# Patient Record
Sex: Female | Born: 1950 | Race: White | Hispanic: No | Marital: Married | Smoking: Never smoker
Health system: Southern US, Community
[De-identification: ages and names within clinical notes are randomized; demographics above are authoritative.]

## PROBLEM LIST (undated history)

## (undated) DIAGNOSIS — J189 Pneumonia, unspecified organism: Secondary | ICD-10-CM

## (undated) DIAGNOSIS — M8580 Other specified disorders of bone density and structure, unspecified site: Secondary | ICD-10-CM

## (undated) DIAGNOSIS — Z8719 Personal history of other diseases of the digestive system: Secondary | ICD-10-CM

## (undated) DIAGNOSIS — R112 Nausea with vomiting, unspecified: Secondary | ICD-10-CM

## (undated) DIAGNOSIS — K5790 Diverticulosis of intestine, part unspecified, without perforation or abscess without bleeding: Secondary | ICD-10-CM

## (undated) DIAGNOSIS — K2210 Ulcer of esophagus without bleeding: Secondary | ICD-10-CM

## (undated) DIAGNOSIS — E039 Hypothyroidism, unspecified: Secondary | ICD-10-CM

## (undated) DIAGNOSIS — T8859XA Other complications of anesthesia, initial encounter: Secondary | ICD-10-CM

## (undated) DIAGNOSIS — K5900 Constipation, unspecified: Secondary | ICD-10-CM

## (undated) DIAGNOSIS — F0781 Postconcussional syndrome: Secondary | ICD-10-CM

## (undated) DIAGNOSIS — K219 Gastro-esophageal reflux disease without esophagitis: Secondary | ICD-10-CM

## (undated) DIAGNOSIS — G4733 Obstructive sleep apnea (adult) (pediatric): Secondary | ICD-10-CM

## (undated) DIAGNOSIS — Z9889 Other specified postprocedural states: Secondary | ICD-10-CM

## (undated) DIAGNOSIS — R269 Unspecified abnormalities of gait and mobility: Secondary | ICD-10-CM

## (undated) DIAGNOSIS — I10 Essential (primary) hypertension: Secondary | ICD-10-CM

## (undated) DIAGNOSIS — K5792 Diverticulitis of intestine, part unspecified, without perforation or abscess without bleeding: Secondary | ICD-10-CM

## (undated) DIAGNOSIS — I4891 Unspecified atrial fibrillation: Secondary | ICD-10-CM

## (undated) DIAGNOSIS — I499 Cardiac arrhythmia, unspecified: Secondary | ICD-10-CM

## (undated) DIAGNOSIS — R7303 Prediabetes: Secondary | ICD-10-CM

## (undated) DIAGNOSIS — E785 Hyperlipidemia, unspecified: Secondary | ICD-10-CM

## (undated) DIAGNOSIS — M549 Dorsalgia, unspecified: Secondary | ICD-10-CM

## (undated) DIAGNOSIS — F419 Anxiety disorder, unspecified: Secondary | ICD-10-CM

## (undated) DIAGNOSIS — M1990 Unspecified osteoarthritis, unspecified site: Secondary | ICD-10-CM

## (undated) DIAGNOSIS — K760 Fatty (change of) liver, not elsewhere classified: Secondary | ICD-10-CM

## (undated) DIAGNOSIS — D869 Sarcoidosis, unspecified: Secondary | ICD-10-CM

## (undated) DIAGNOSIS — F32A Depression, unspecified: Secondary | ICD-10-CM

## (undated) DIAGNOSIS — F329 Major depressive disorder, single episode, unspecified: Secondary | ICD-10-CM

## (undated) DIAGNOSIS — F0390 Unspecified dementia without behavioral disturbance: Secondary | ICD-10-CM

## (undated) HISTORY — DX: Hypothyroidism, unspecified: E03.9

## (undated) HISTORY — PX: HAND SURGERY: SHX662

## (undated) HISTORY — PX: TOTAL ABDOMINAL HYSTERECTOMY: SHX209

## (undated) HISTORY — PX: CATARACT EXTRACTION: SUR2

## (undated) HISTORY — DX: Diverticulitis of intestine, part unspecified, without perforation or abscess without bleeding: K57.92

## (undated) HISTORY — DX: Gastro-esophageal reflux disease without esophagitis: K21.9

## (undated) HISTORY — DX: Fatty (change of) liver, not elsewhere classified: K76.0

## (undated) HISTORY — DX: Depression, unspecified: F32.A

## (undated) HISTORY — PX: CATARACT EXTRACTION W/ INTRAOCULAR LENS IMPLANT: SHX1309

## (undated) HISTORY — DX: Unspecified atrial fibrillation: I48.91

## (undated) HISTORY — DX: Postconcussional syndrome: F07.81

## (undated) HISTORY — PX: ROTATOR CUFF REPAIR: SHX139

## (undated) HISTORY — PX: LASIK: SHX215

## (undated) HISTORY — DX: Diverticulosis of intestine, part unspecified, without perforation or abscess without bleeding: K57.90

## (undated) HISTORY — DX: Obstructive sleep apnea (adult) (pediatric): G47.33

## (undated) HISTORY — PX: KNEE SURGERY: SHX244

## (undated) HISTORY — DX: Ulcer of esophagus without bleeding: K22.10

## (undated) HISTORY — DX: Other specified disorders of bone density and structure, unspecified site: M85.80

## (undated) HISTORY — PX: COLONOSCOPY: SHX174

## (undated) HISTORY — DX: Essential (primary) hypertension: I10

## (undated) HISTORY — DX: Hyperlipidemia, unspecified: E78.5

## (undated) HISTORY — DX: Constipation, unspecified: K59.00

## (undated) HISTORY — PX: DILATION AND CURETTAGE OF UTERUS: SHX78

## (undated) HISTORY — DX: Dorsalgia, unspecified: M54.9

## (undated) HISTORY — DX: Prediabetes: R73.03

## (undated) HISTORY — DX: Major depressive disorder, single episode, unspecified: F32.9

## (undated) HISTORY — DX: Unspecified abnormalities of gait and mobility: R26.9

## (undated) HISTORY — PX: BREAST BIOPSY: SHX20

## (undated) HISTORY — PX: EYE SURGERY: SHX253

---

## 2006-10-15 ENCOUNTER — Encounter: Admission: RE | Admit: 2006-10-15 | Discharge: 2006-10-15 | Payer: Self-pay | Admitting: *Deleted

## 2006-10-22 ENCOUNTER — Encounter: Admission: RE | Admit: 2006-10-22 | Discharge: 2006-10-22 | Payer: Self-pay | Admitting: *Deleted

## 2009-01-22 ENCOUNTER — Encounter: Admission: RE | Admit: 2009-01-22 | Discharge: 2009-01-22 | Payer: Self-pay | Admitting: Family Medicine

## 2010-08-08 ENCOUNTER — Emergency Department (HOSPITAL_BASED_OUTPATIENT_CLINIC_OR_DEPARTMENT_OTHER): Admission: EM | Admit: 2010-08-08 | Discharge: 2010-08-08 | Payer: Self-pay | Admitting: Emergency Medicine

## 2010-08-08 ENCOUNTER — Ambulatory Visit: Payer: Self-pay | Admitting: Diagnostic Radiology

## 2010-10-12 ENCOUNTER — Encounter: Payer: Self-pay | Admitting: Family Medicine

## 2011-02-14 ENCOUNTER — Ambulatory Visit (HOSPITAL_COMMUNITY)
Admission: RE | Admit: 2011-02-14 | Discharge: 2011-02-14 | Disposition: A | Discharge status: Home / Self Care | Payer: BC Managed Care – PPO | Source: Ambulatory Visit | Attending: Orthopedic Surgery | Admitting: Orthopedic Surgery

## 2011-02-14 ENCOUNTER — Encounter (HOSPITAL_COMMUNITY)
Admission: RE | Admit: 2011-02-14 | Discharge: 2011-02-14 | Disposition: A | Discharge status: Home / Self Care | Payer: BC Managed Care – PPO | Source: Ambulatory Visit | Attending: Orthopedic Surgery | Admitting: Orthopedic Surgery

## 2011-02-14 ENCOUNTER — Other Ambulatory Visit (HOSPITAL_COMMUNITY): Payer: Self-pay | Admitting: Orthopedic Surgery

## 2011-02-14 DIAGNOSIS — M75101 Unspecified rotator cuff tear or rupture of right shoulder, not specified as traumatic: Secondary | ICD-10-CM

## 2011-02-14 DIAGNOSIS — Z01812 Encounter for preprocedural laboratory examination: Secondary | ICD-10-CM | POA: Insufficient documentation

## 2011-02-14 DIAGNOSIS — G4730 Sleep apnea, unspecified: Secondary | ICD-10-CM | POA: Insufficient documentation

## 2011-02-14 DIAGNOSIS — Z01818 Encounter for other preprocedural examination: Secondary | ICD-10-CM | POA: Insufficient documentation

## 2011-02-14 DIAGNOSIS — I10 Essential (primary) hypertension: Secondary | ICD-10-CM | POA: Insufficient documentation

## 2011-02-14 DIAGNOSIS — Z01810 Encounter for preprocedural cardiovascular examination: Secondary | ICD-10-CM | POA: Insufficient documentation

## 2011-02-14 LAB — URINALYSIS, ROUTINE W REFLEX MICROSCOPIC
Bilirubin Urine: NEGATIVE
Glucose, UA: NEGATIVE mg/dL
Hgb urine dipstick: NEGATIVE
Ketones, ur: NEGATIVE mg/dL
Nitrite: NEGATIVE
Protein, ur: NEGATIVE mg/dL
Specific Gravity, Urine: 1.021 (ref 1.005–1.030)
Urobilinogen, UA: 0.2 mg/dL (ref 0.0–1.0)
pH: 6 (ref 5.0–8.0)

## 2011-02-14 LAB — DIFFERENTIAL
Basophils Absolute: 0.1 10*3/uL (ref 0.0–0.1)
Basophils Relative: 2 % — ABNORMAL HIGH (ref 0–1)
Eosinophils Absolute: 0.3 10*3/uL (ref 0.0–0.7)
Eosinophils Relative: 4 % (ref 0–5)
Lymphocytes Relative: 32 % (ref 12–46)
Lymphs Abs: 2.3 10*3/uL (ref 0.7–4.0)
Monocytes Absolute: 0.5 10*3/uL (ref 0.1–1.0)
Monocytes Relative: 6 % (ref 3–12)
Neutro Abs: 4.2 10*3/uL (ref 1.7–7.7)
Neutrophils Relative %: 57 % (ref 43–77)

## 2011-02-14 LAB — CBC
HCT: 41.1 % (ref 36.0–46.0)
Hemoglobin: 13.9 g/dL (ref 12.0–15.0)
MCH: 28.8 pg (ref 26.0–34.0)
MCHC: 33.8 g/dL (ref 30.0–36.0)
MCV: 85.3 fL (ref 78.0–100.0)
Platelets: 246 10*3/uL (ref 150–400)
RBC: 4.82 MIL/uL (ref 3.87–5.11)
RDW: 13.2 % (ref 11.5–15.5)
WBC: 7.3 10*3/uL (ref 4.0–10.5)

## 2011-02-14 LAB — BASIC METABOLIC PANEL
BUN: 14 mg/dL (ref 6–23)
CO2: 27 mEq/L (ref 19–32)
Calcium: 9.2 mg/dL (ref 8.4–10.5)
Chloride: 104 mEq/L (ref 96–112)
Creatinine, Ser: 0.61 mg/dL (ref 0.4–1.2)
GFR calc Af Amer: >60 mL/min (ref >60)
GFR calc non Af Amer: >60 mL/min (ref >60)
Glucose, Bld: 91 mg/dL (ref 70–99)
Potassium: 3.6 mEq/L (ref 3.5–5.1)
Sodium: 141 mEq/L (ref 135–145)

## 2011-02-14 LAB — URINE MICROSCOPIC-ADD ON

## 2011-02-14 LAB — APTT: aPTT: 27 seconds (ref 24–37)

## 2011-02-14 LAB — SURGICAL PCR SCREEN
MRSA, PCR: NEGATIVE
Staphylococcus aureus: NEGATIVE

## 2011-02-14 LAB — PROTIME-INR
INR: 0.93 (ref 0.00–1.49)
Prothrombin Time: 12.7 seconds (ref 11.6–15.2)

## 2011-02-17 ENCOUNTER — Ambulatory Visit (HOSPITAL_COMMUNITY)
Admission: RE | Admit: 2011-02-17 | Discharge: 2011-02-18 | Disposition: A | Discharge status: Home / Self Care | Payer: BC Managed Care – PPO | Source: Ambulatory Visit | Attending: Orthopedic Surgery | Admitting: Orthopedic Surgery

## 2011-02-17 DIAGNOSIS — M67919 Unspecified disorder of synovium and tendon, unspecified shoulder: Secondary | ICD-10-CM | POA: Insufficient documentation

## 2011-02-17 DIAGNOSIS — E669 Obesity, unspecified: Secondary | ICD-10-CM | POA: Insufficient documentation

## 2011-02-17 DIAGNOSIS — Z5333 Arthroscopic surgical procedure converted to open procedure: Secondary | ICD-10-CM | POA: Insufficient documentation

## 2011-02-17 DIAGNOSIS — M7530 Calcific tendinitis of unspecified shoulder: Secondary | ICD-10-CM | POA: Insufficient documentation

## 2011-02-17 DIAGNOSIS — M19019 Primary osteoarthritis, unspecified shoulder: Secondary | ICD-10-CM | POA: Insufficient documentation

## 2011-02-17 DIAGNOSIS — G4730 Sleep apnea, unspecified: Secondary | ICD-10-CM | POA: Insufficient documentation

## 2011-02-17 DIAGNOSIS — M719 Bursopathy, unspecified: Secondary | ICD-10-CM | POA: Insufficient documentation

## 2011-02-17 DIAGNOSIS — I10 Essential (primary) hypertension: Secondary | ICD-10-CM | POA: Insufficient documentation

## 2011-02-18 LAB — BASIC METABOLIC PANEL
BUN: 13 mg/dL (ref 6–23)
CO2: 28 mEq/L (ref 19–32)
Calcium: 8.3 mg/dL — ABNORMAL LOW (ref 8.4–10.5)
Chloride: 105 mEq/L (ref 96–112)
Creatinine, Ser: 0.57 mg/dL (ref 0.4–1.2)
GFR calc Af Amer: >60 mL/min (ref >60)
GFR calc non Af Amer: >60 mL/min (ref >60)
Glucose, Bld: 146 mg/dL — ABNORMAL HIGH (ref 70–99)
Potassium: 3.6 mEq/L (ref 3.5–5.1)
Sodium: 141 mEq/L (ref 135–145)

## 2011-02-18 LAB — CBC
HCT: 36.3 % (ref 36.0–46.0)
Hemoglobin: 12.2 g/dL (ref 12.0–15.0)
MCH: 29 pg (ref 26.0–34.0)
MCHC: 33.6 g/dL (ref 30.0–36.0)
MCV: 86.2 fL (ref 78.0–100.0)
Platelets: 210 10*3/uL (ref 150–400)
RBC: 4.21 MIL/uL (ref 3.87–5.11)
RDW: 13.1 % (ref 11.5–15.5)
WBC: 12.8 10*3/uL — ABNORMAL HIGH (ref 4.0–10.5)

## 2011-03-31 NOTE — Op Note (Signed)
Lisa Acosta, Lisa NO.:  [id number]  MEDICAL RECORD NO.:  [id number]  LOCATION:  5015                         FACILITY:  [facility]  PHYSICIAN:  Almedia Balls. Ranell Patrick, M.D. DATE OF BIRTH:  1951/01/13  DATE OF PROCEDURE:  02/17/2011 DATE OF DISCHARGE:                              OPERATIVE REPORT   PREOPERATIVE DIAGNOSES:  Right shoulder pain secondary to rotator cuff tear and also acromioclavicular joint arthritis.  POSTOPERATIVE DIAGNOSES:  Right shoulder pain secondary to rotator cuff tear and also acromioclavicular joint arthritis as well as calcific tendonitis.  PROCEDURE PERFORMED:  Right shoulder fluoroscopy with arthroscopic subacromial decompression followed by mini open rotator cuff repair as well as decompression of calcific deposits and open distal clavicle resection.  ATTENDING SURGEON:  Almedia Balls. Ranell Patrick, MD  ASSISTANT:  Jaquelyn Bitter. Chabon, PA  General anesthesia was used plus interscalene block.  ESTIMATED BLOOD LOSS:  Minimal.  FLUID REPLACEMENT:  Crystalloid 1200 mL.  INSTRUMENT COUNTS:  Correct.  There were no complications.  Perioperative antibiotics were given.  INDICATIONS:  The patient is a 60 year old female with a history of worsening right shoulder pain secondary to rotator cuff tear as well as impingement syndrome and AC joint arthritis.  The patient presents now having failed conservative management, desired operative treatment to restore function and eliminate pain to her shoulder.  Informed consent obtained.  DESCRIPTION OF PROCEDURE:  After an adequate level of anesthesia was achieved, the patient positioned in modified beach-chair position. Right shoulder examined under anesthesia.  Full passive range of motion of the shoulder noted with no undue stiffness, no instability.  After sterile prep and drape of the shoulder, we entered the shoulder using standard portals including anterior, posterior, and lateral portals.   We identified significant synovitis within the shoulder joint but no obvious superior labral tear.  The biceps tendon was retracted and the joint was felt to be normal.  The subscapularis was normal.  Rotator cuff was visualized from the joint side and was normal.  Articular cartilage was visualized from both the posterior aspect of the shoulder and the anterior aspect of the shoulder and was noted to be normal.  No significant cartilage defects noted.  Posterior labrum was intact. Posterior-inferior and anterior-inferior labrum was normal.  At this point, the scope was placed in the subacromial space.  Thorough bursectomy was performed followed by an acromioplasty creating a type 1 acromial shape with a butcher block technique and a high-speed bur.  We decompressed all the way over to the [facility] joint and out laterally to the anterolateral shoulder careful to remove the lateral overhang.  At this point, we inspected the rotator cuff.  It felt like there was a defect present right about the middle of the supraspinatus tendon, and also just posterior to that a fairly large lump of what appeared to be calcium.  It was not expressible after needling it with an 18-gauge needle.  At this point, we concluded the arthroscopic portion of the procedure.  We then made a saber incision overlying the AC joint. Dissection down through subcutaneous tissues and this was in [employer].  Subcu dissection with needle-tipped Bovie.  We identified the deltotrapezial fascia,  divided that in line with distal clavicle followed by subperiosteal dissection of distal clavicle.  We then performed a distal clavicle resection using an oscillating saw.  A 2 mm to 3 mm of distal clavicle was removed.  We then applied bone wax to cut into the clavicle thoroughly irrigating the AC interval and then removing some soft tissue from the joint there basically to enter the disk in the [facility] joint and little bit dorsal capsule  that appeared to be pathologic.  At this point, thoroughly irrigated and closed the deltotrapezial fascia with 0 Vicryl interrupted figure-of-eight suture followed by 2-0 Vicryl subcutaneous closure.  We then addressed the rotator cuff tear through the mini open incision, starting at the anterolateral border of the acromion, extending distally about 4 cm. Dissection down through subcutaneous tissues using Bovie.  We identified the raphe between the anterolateral head of the deltoid and divided that using the needle-tipped Bovie, developed that interval, placed our Arthrex retractor.  Next, we went ahead and was able to easily palpate a large calcific mass present just posterior to an area of softness consistent with the rotator cuff tear.  We went ahead and incised the tendon in between that area in line with its fibers immediately encountering some damaged degenerative tendon and also a large calcific mass which was pretty firm.  We removed that using a Therapist, nutritional and then used a curette and a rongeur to prepare the rotator cuff footprint. We then performed a side-to-side repair with #2 FiberWire suture x3 and some 0 Vicryl reinforcing as well.  We had a nice repair not under tension.  The rotator cuff fully decompressed and repaired.  At this point, we took the shoulder through a full range of motion.  No impingement was noted, thoroughly irrigated the subdeltoid interval, repaired the deltoid to itself with 0 Vicryl suture followed by 2-0 Vicryl subcutaneous closure and 4-0 Monocryl for skin.  Steri-Strips were applied followed by sterile dressing.  The patient tolerated the surgery well.     Almedia Balls. Ranell Patrick, M.D.     SRN/MEDQ  D:  02/17/2011  T:  02/18/2011  Job:  [id number]  Electronically Signed by Malon Kindle  on 03/31/2011 12:04:55 AM

## 2013-11-25 DIAGNOSIS — F411 Generalized anxiety disorder: Secondary | ICD-10-CM | POA: Insufficient documentation

## 2014-03-18 DIAGNOSIS — K2210 Ulcer of esophagus without bleeding: Secondary | ICD-10-CM | POA: Insufficient documentation

## 2014-03-18 DIAGNOSIS — K219 Gastro-esophageal reflux disease without esophagitis: Secondary | ICD-10-CM | POA: Insufficient documentation

## 2014-06-05 DIAGNOSIS — G4700 Insomnia, unspecified: Secondary | ICD-10-CM | POA: Insufficient documentation

## 2014-06-05 DIAGNOSIS — J309 Allergic rhinitis, unspecified: Secondary | ICD-10-CM | POA: Insufficient documentation

## 2014-06-05 DIAGNOSIS — M25559 Pain in unspecified hip: Secondary | ICD-10-CM | POA: Insufficient documentation

## 2014-06-05 DIAGNOSIS — K5730 Diverticulosis of large intestine without perforation or abscess without bleeding: Secondary | ICD-10-CM | POA: Insufficient documentation

## 2014-06-05 DIAGNOSIS — R739 Hyperglycemia, unspecified: Secondary | ICD-10-CM | POA: Insufficient documentation

## 2014-06-05 DIAGNOSIS — M1990 Unspecified osteoarthritis, unspecified site: Secondary | ICD-10-CM | POA: Insufficient documentation

## 2014-06-05 DIAGNOSIS — R7301 Impaired fasting glucose: Secondary | ICD-10-CM | POA: Insufficient documentation

## 2014-06-05 DIAGNOSIS — K449 Diaphragmatic hernia without obstruction or gangrene: Secondary | ICD-10-CM | POA: Insufficient documentation

## 2014-06-05 DIAGNOSIS — M19019 Primary osteoarthritis, unspecified shoulder: Secondary | ICD-10-CM | POA: Insufficient documentation

## 2014-06-05 DIAGNOSIS — K2900 Acute gastritis without bleeding: Secondary | ICD-10-CM | POA: Insufficient documentation

## 2014-06-05 DIAGNOSIS — M545 Low back pain, unspecified: Secondary | ICD-10-CM | POA: Insufficient documentation

## 2014-06-05 DIAGNOSIS — M79644 Pain in right finger(s): Secondary | ICD-10-CM | POA: Insufficient documentation

## 2014-06-05 DIAGNOSIS — R1310 Dysphagia, unspecified: Secondary | ICD-10-CM | POA: Insufficient documentation

## 2014-07-25 ENCOUNTER — Encounter: Payer: Self-pay | Admitting: *Deleted

## 2015-06-04 DIAGNOSIS — H34831 Tributary (branch) retinal vein occlusion, right eye: Secondary | ICD-10-CM | POA: Diagnosis not present

## 2015-06-04 DIAGNOSIS — H35033 Hypertensive retinopathy, bilateral: Secondary | ICD-10-CM | POA: Diagnosis not present

## 2015-06-04 DIAGNOSIS — H3581 Retinal edema: Secondary | ICD-10-CM | POA: Diagnosis not present

## 2015-06-15 DIAGNOSIS — Z23 Encounter for immunization: Secondary | ICD-10-CM | POA: Diagnosis not present

## 2015-06-15 DIAGNOSIS — F339 Major depressive disorder, recurrent, unspecified: Secondary | ICD-10-CM | POA: Diagnosis not present

## 2015-06-15 DIAGNOSIS — R5383 Other fatigue: Secondary | ICD-10-CM | POA: Diagnosis not present

## 2015-06-15 DIAGNOSIS — E669 Obesity, unspecified: Secondary | ICD-10-CM | POA: Diagnosis not present

## 2015-06-15 DIAGNOSIS — I10 Essential (primary) hypertension: Secondary | ICD-10-CM | POA: Diagnosis not present

## 2015-06-15 DIAGNOSIS — G4733 Obstructive sleep apnea (adult) (pediatric): Secondary | ICD-10-CM | POA: Diagnosis not present

## 2015-06-15 DIAGNOSIS — K219 Gastro-esophageal reflux disease without esophagitis: Secondary | ICD-10-CM | POA: Diagnosis not present

## 2015-06-15 DIAGNOSIS — E039 Hypothyroidism, unspecified: Secondary | ICD-10-CM | POA: Diagnosis not present

## 2015-06-15 DIAGNOSIS — E785 Hyperlipidemia, unspecified: Secondary | ICD-10-CM | POA: Diagnosis not present

## 2015-07-02 DIAGNOSIS — R05 Cough: Secondary | ICD-10-CM | POA: Diagnosis not present

## 2015-07-13 DIAGNOSIS — M5489 Other dorsalgia: Secondary | ICD-10-CM | POA: Diagnosis not present

## 2015-07-13 DIAGNOSIS — M5136 Other intervertebral disc degeneration, lumbar region: Secondary | ICD-10-CM | POA: Diagnosis not present

## 2015-07-13 DIAGNOSIS — M5134 Other intervertebral disc degeneration, thoracic region: Secondary | ICD-10-CM | POA: Diagnosis not present

## 2015-07-13 DIAGNOSIS — M546 Pain in thoracic spine: Secondary | ICD-10-CM | POA: Diagnosis not present

## 2015-07-13 DIAGNOSIS — M545 Low back pain: Secondary | ICD-10-CM | POA: Diagnosis not present

## 2015-07-13 DIAGNOSIS — I10 Essential (primary) hypertension: Secondary | ICD-10-CM | POA: Diagnosis not present

## 2015-07-13 DIAGNOSIS — R05 Cough: Secondary | ICD-10-CM | POA: Diagnosis not present

## 2015-07-15 DIAGNOSIS — M545 Low back pain: Secondary | ICD-10-CM | POA: Diagnosis not present

## 2015-07-15 DIAGNOSIS — M5489 Other dorsalgia: Secondary | ICD-10-CM | POA: Diagnosis not present

## 2015-07-18 DIAGNOSIS — M546 Pain in thoracic spine: Secondary | ICD-10-CM | POA: Diagnosis not present

## 2015-07-18 DIAGNOSIS — M5124 Other intervertebral disc displacement, thoracic region: Secondary | ICD-10-CM | POA: Diagnosis not present

## 2015-07-21 DIAGNOSIS — M546 Pain in thoracic spine: Secondary | ICD-10-CM | POA: Diagnosis not present

## 2015-07-21 DIAGNOSIS — R05 Cough: Secondary | ICD-10-CM | POA: Diagnosis not present

## 2015-07-21 DIAGNOSIS — R079 Chest pain, unspecified: Secondary | ICD-10-CM | POA: Diagnosis not present

## 2015-07-21 DIAGNOSIS — R0781 Pleurodynia: Secondary | ICD-10-CM | POA: Diagnosis not present

## 2015-07-29 DIAGNOSIS — R109 Unspecified abdominal pain: Secondary | ICD-10-CM | POA: Diagnosis not present

## 2015-07-29 DIAGNOSIS — E785 Hyperlipidemia, unspecified: Secondary | ICD-10-CM | POA: Diagnosis not present

## 2015-07-29 DIAGNOSIS — G4733 Obstructive sleep apnea (adult) (pediatric): Secondary | ICD-10-CM | POA: Diagnosis not present

## 2015-07-29 DIAGNOSIS — M1990 Unspecified osteoarthritis, unspecified site: Secondary | ICD-10-CM | POA: Diagnosis not present

## 2015-07-29 DIAGNOSIS — R079 Chest pain, unspecified: Secondary | ICD-10-CM | POA: Diagnosis not present

## 2015-07-29 DIAGNOSIS — Z882 Allergy status to sulfonamides status: Secondary | ICD-10-CM | POA: Diagnosis not present

## 2015-07-29 DIAGNOSIS — M549 Dorsalgia, unspecified: Secondary | ICD-10-CM | POA: Diagnosis not present

## 2015-07-29 DIAGNOSIS — S29011A Strain of muscle and tendon of front wall of thorax, initial encounter: Secondary | ICD-10-CM | POA: Diagnosis not present

## 2015-07-29 DIAGNOSIS — I10 Essential (primary) hypertension: Secondary | ICD-10-CM | POA: Diagnosis not present

## 2015-07-29 DIAGNOSIS — Z780 Asymptomatic menopausal state: Secondary | ICD-10-CM | POA: Diagnosis not present

## 2015-07-29 DIAGNOSIS — K219 Gastro-esophageal reflux disease without esophagitis: Secondary | ICD-10-CM | POA: Diagnosis not present

## 2015-07-29 DIAGNOSIS — H9190 Unspecified hearing loss, unspecified ear: Secondary | ICD-10-CM | POA: Diagnosis not present

## 2015-07-29 DIAGNOSIS — G4700 Insomnia, unspecified: Secondary | ICD-10-CM | POA: Diagnosis not present

## 2015-07-29 DIAGNOSIS — E079 Disorder of thyroid, unspecified: Secondary | ICD-10-CM | POA: Diagnosis not present

## 2015-07-30 DIAGNOSIS — H43813 Vitreous degeneration, bilateral: Secondary | ICD-10-CM | POA: Diagnosis not present

## 2015-07-30 DIAGNOSIS — H34831 Tributary (branch) retinal vein occlusion, right eye, with macular edema: Secondary | ICD-10-CM | POA: Diagnosis not present

## 2015-07-30 DIAGNOSIS — H35033 Hypertensive retinopathy, bilateral: Secondary | ICD-10-CM | POA: Diagnosis not present

## 2015-09-14 DIAGNOSIS — R109 Unspecified abdominal pain: Secondary | ICD-10-CM | POA: Diagnosis not present

## 2015-09-25 DIAGNOSIS — R1084 Generalized abdominal pain: Secondary | ICD-10-CM | POA: Diagnosis not present

## 2015-09-25 DIAGNOSIS — K5790 Diverticulosis of intestine, part unspecified, without perforation or abscess without bleeding: Secondary | ICD-10-CM | POA: Diagnosis not present

## 2015-09-28 DIAGNOSIS — M1811 Unilateral primary osteoarthritis of first carpometacarpal joint, right hand: Secondary | ICD-10-CM | POA: Diagnosis not present

## 2015-10-08 DIAGNOSIS — H43813 Vitreous degeneration, bilateral: Secondary | ICD-10-CM | POA: Diagnosis not present

## 2015-10-08 DIAGNOSIS — H34831 Tributary (branch) retinal vein occlusion, right eye, with macular edema: Secondary | ICD-10-CM | POA: Diagnosis not present

## 2015-10-08 DIAGNOSIS — H3582 Retinal ischemia: Secondary | ICD-10-CM | POA: Diagnosis not present

## 2015-10-31 ENCOUNTER — Encounter (HOSPITAL_BASED_OUTPATIENT_CLINIC_OR_DEPARTMENT_OTHER): Payer: Self-pay | Admitting: *Deleted

## 2015-10-31 ENCOUNTER — Emergency Department (HOSPITAL_BASED_OUTPATIENT_CLINIC_OR_DEPARTMENT_OTHER)
Admission: EM | Admit: 2015-10-31 | Discharge: 2015-10-31 | Disposition: A | Discharge status: Home / Self Care | Payer: Medicare Other | Attending: Emergency Medicine | Admitting: Emergency Medicine

## 2015-10-31 DIAGNOSIS — R002 Palpitations: Secondary | ICD-10-CM | POA: Diagnosis not present

## 2015-10-31 DIAGNOSIS — F329 Major depressive disorder, single episode, unspecified: Secondary | ICD-10-CM | POA: Insufficient documentation

## 2015-10-31 DIAGNOSIS — E785 Hyperlipidemia, unspecified: Secondary | ICD-10-CM | POA: Diagnosis not present

## 2015-10-31 DIAGNOSIS — E079 Disorder of thyroid, unspecified: Secondary | ICD-10-CM | POA: Insufficient documentation

## 2015-10-31 DIAGNOSIS — K219 Gastro-esophageal reflux disease without esophagitis: Secondary | ICD-10-CM | POA: Insufficient documentation

## 2015-10-31 DIAGNOSIS — Z79899 Other long term (current) drug therapy: Secondary | ICD-10-CM | POA: Insufficient documentation

## 2015-10-31 DIAGNOSIS — Z880 Allergy status to penicillin: Secondary | ICD-10-CM | POA: Insufficient documentation

## 2015-10-31 DIAGNOSIS — I10 Essential (primary) hypertension: Secondary | ICD-10-CM | POA: Diagnosis not present

## 2015-10-31 LAB — CBC
HCT: 37.5 % (ref 36.0–46.0)
Hemoglobin: 12.1 g/dL (ref 12.0–15.0)
MCH: 28.5 pg (ref 26.0–34.0)
MCHC: 32.3 g/dL (ref 30.0–36.0)
MCV: 88.2 fL (ref 78.0–100.0)
Platelets: 226 10*3/uL (ref 150–400)
RBC: 4.25 MIL/uL (ref 3.87–5.11)
RDW: 13 % (ref 11.5–15.5)
WBC: 7.8 10*3/uL (ref 4.0–10.5)

## 2015-10-31 LAB — BASIC METABOLIC PANEL
Anion gap: 7 (ref 5–15)
BUN: 13 mg/dL (ref 6–20)
CO2: 29 mmol/L (ref 22–32)
Calcium: 9.1 mg/dL (ref 8.9–10.3)
Chloride: 103 mmol/L (ref 101–111)
Creatinine, Ser: 0.61 mg/dL (ref 0.44–1.00)
GFR calc Af Amer: >60 mL/min (ref >60)
GFR calc non Af Amer: >60 mL/min (ref >60)
Glucose, Bld: 103 mg/dL — ABNORMAL HIGH (ref 65–99)
Potassium: 3.5 mmol/L (ref 3.5–5.1)
Sodium: 139 mmol/L (ref 135–145)

## 2015-10-31 NOTE — ED Notes (Signed)
Palpitations x 2 hours. Reports hx of same but has not had cardiology work up yet. Episodes increasing in frequency x 3 weeks

## 2015-10-31 NOTE — ED Provider Notes (Signed)
CSN: [id number]     Arrival date & time 10/31/15  1954 History  By signing my name below, I, Doran Stabler, attest that this documentation has been prepared under the direction and in the presence of No att. providers found. Electronically Signed: Doran Stabler, ED Scribe. 10/31/2015. 10:17 PM.   Chief Complaint  Patient presents with  . Palpitations   Patient is a 65 y.o. female presenting with palpitations. The history is provided by the patient. No language interpreter was used.  Palpitations Palpitations quality:  Fast Onset quality:  Gradual Duration:  3 weeks Timing:  Intermittent Progression:  Unchanged Chronicity:  Recurrent Context: not caffeine   Relieved by:  Nothing Worsened by:  Nothing Ineffective treatments:  None tried Associated symptoms: no dizziness, no nausea, no syncope and no vomiting    HPI Comments: Lisa Acosta is a 65 y.o. female with a PMHx of HLD, HTN, and GERD who presents to the Emergency Department complaining of intermittent palpitations for the past 1 year but an increase in frequency the past 3 weeks. Pt had an episode PTA but currently is asymptomatic. Pt reports that coughing usually improves her symptoms. Pt took a atenolol, PTA. Pt denies dizziness, fevers, vomiting, or any other symptoms at this time. Pt denies alcohol abuse or excessive caffeine use. Pt denies any changes in her medications.   Past Medical History  Diagnosis Date  . Hyperlipidemia   . Hypertension   . Thyroid disease   . GERD (gastroesophageal reflux disease)   . Depression   . Diverticulosis    Past Surgical History  Procedure Laterality Date  . Total abdominal hysterectomy    . Rotator cuff repair    . Hand surgery    . Knee surgery    . Cataract extraction     Family History  Problem Relation Age of Onset  . Family history unknown: Yes   Social History  Substance Use Topics  . Smoking status: Never Smoker   . Smokeless tobacco: Never Used  . Alcohol Use:  Yes     Comment: rare   OB History    No data available     Review of Systems  Constitutional: Negative for fever.  Cardiovascular: Positive for palpitations. Negative for syncope.  Gastrointestinal: Negative for nausea and vomiting.  Neurological: Negative for dizziness.  ROS reviewed and all otherwise negative except for mentioned in HPI Allergies  Dextrans; Penicillins; and Sulfa antibiotics  Home Medications   Prior to Admission medications   Medication Sig Start Date End Date Taking? Authorizing Provider  FLUoxetine (PROZAC) 40 MG capsule Take 40 mg by mouth daily.   Yes Historical Provider, MD  levothyroxine (SYNTHROID, LEVOTHROID) 125 MCG tablet Take 125 mcg by mouth daily before breakfast.   Yes Historical Provider, MD  lovastatin (ALTOPREV) 40 MG 24 hr tablet Take 40 mg by mouth at bedtime.   Yes Historical Provider, MD  omeprazole (PRILOSEC) 20 MG capsule Take 20 mg by mouth daily.   Yes Historical Provider, MD  traZODone (DESYREL) 50 MG tablet Take 50 mg by mouth at bedtime.   Yes Historical Provider, MD  atenolol (TENORMIN) 25 MG tablet Take by mouth daily.    Historical Provider, MD  citalopram (CELEXA) 40 MG tablet Take 40 mg by mouth daily.    Historical Provider, MD   BP 147/83 mmHg  Pulse 54  Temp(Src) 99.6 F (37.6 C) (Oral)  Resp 20  Ht 5\' 1"  (1.549 m)  Wt 198 lb (89.812 kg)  BMI  37.43 kg/m2  SpO2 93%  Vitals reviewed Physical Exam  Physical Examination: General appearance - alert, well appearing, and in no distress Mental status - alert, oriented to person, place, and time Eyes - no conjunctival injection no scleral icterus Mouth - mucous membranes moist, pharynx normal without lesions Chest - clear to auscultation, no wheezes, rales or rhonchi, symmetric air entry Heart - normal rate, regular rhythm, normal S1, S2, no murmurs, rubs, clicks or gallops Abdomen - soft, nontender, nondistended, no masses or organomegaly Neurological - alert, oriented,  normal speech Extremities - peripheral pulses normal, no pedal edema, no clubbing or cyanosis Skin - normal coloration and turgor, no rashes  ED Course  Procedures   DIAGNOSTIC STUDIES: Oxygen Saturation is 100% on room air, normal by my interpretation.    COORDINATION OF CARE: 10:15 PM Will order EKG. Discussed treatment plan with pt at bedside and pt agreed to plan.  Labs Review Labs Reviewed  BASIC METABOLIC PANEL - Abnormal; Notable for the following:    Glucose, Bld 103 (*)    All other components within normal limits  CBC    Imaging Review No results found. I have personally reviewed and evaluated these images and lab results as part of my medical decision-making.   EKG Interpretation   Date/Time:  Sunday October 31 2015 20:13:00 EST Ventricular Rate:  54 PR Interval:  176 QRS Duration: 88 QT Interval:  416 QTC Calculation: 394 R Axis:   34 Text Interpretation:  Sinus bradycardia Otherwise normal ECG No old  tracing to compare Confirmed by [facility]  MD, Edna [phone]) on 10/31/2015  8:29:14 PM  MDM   Final diagnoses:  Palpitations    Pt presenting with c/o palpitations.  She has no syncope or chest pain associated.  EKG in the ED is reassuring, electolytes and other labs are reassuring as well.  D/w patient arranging for followup with cardiology for holter monitor.  Discharged with strict return precautions.  Pt agreeable with plan.  I personally performed the services described in this documentation, which was scribed in my presence. The recorded information has been reviewed and is accurate.     Alfonzo Beers, MD 10/31/15 2350

## 2015-10-31 NOTE — ED Notes (Signed)
Alert, NAD, calm, interactive, resps e/u, speaking in clear complete, here for intermittant recurrent palpitations, several times in last 3 weeks, onset 1 year ago, "was suppose to be set up for a holter monitor, but was not", episode tonight occurred at 1800 with some sob (denies: dizziness, nausea or pain), also denies sob at this time.

## 2015-10-31 NOTE — Discharge Instructions (Signed)
Return to the ED with any concerns including chest pain, fainting, swelling, decreased level of alertness/lethargy, or any other alarming symptoms

## 2015-11-05 DIAGNOSIS — J0100 Acute maxillary sinusitis, unspecified: Secondary | ICD-10-CM | POA: Diagnosis not present

## 2015-11-09 NOTE — Progress Notes (Signed)
HPI: 65 year old female for evaluation of palpitations. Laboratories February 2017 showed potassium 3.5 and normal hemoglobin. Patient describes intermittent palpitations for approximately 2 years. They last 10 min to 2 hours. They are described as heart skipping. There is mild chest tightness and dyspnea but no nausea or syncope. She otherwise has some dyspnea on exertion but no orthopnea, PND, pedal edema or exertional chest pain.  Current Outpatient Prescriptions  Medication Sig Dispense Refill  . atenolol (TENORMIN) 25 MG tablet Take by mouth daily.    . beta carotene w/minerals (OCUVITE) tablet Take 1 tablet by mouth daily.    [name] FLUoxetine (PROZAC) 40 MG capsule Take 40 mg by mouth daily.    Javier Docker Oil 1000 MG CAPS Take 1 capsule by mouth daily.    [name] lactobacillus acidophilus (BACID) TABS tablet Take 1 tablet by mouth daily.    [name] levothyroxine (SYNTHROID, LEVOTHROID) 125 MCG tablet Take 125 mcg by mouth daily before breakfast.    . lovastatin (ALTOPREV) 40 MG 24 hr tablet Take 40 mg by mouth at bedtime.    . magnesium 30 MG tablet Take 30 mg by mouth daily.    . pantoprazole (PROTONIX) 40 MG tablet Take 40 mg by mouth daily.    . Potassium 75 MG TABS Take 1 tablet by mouth daily.    . traZODone (DESYREL) 50 MG tablet Take 50 mg by mouth at bedtime.    . Turmeric 450 MG CAPS Take 1 capsule by mouth daily.    . vitamin B-12 (CYANOCOBALAMIN) 100 MCG tablet Take 100 mcg by mouth daily.     No current facility-administered medications for this visit.    Allergies  Allergen Reactions  . Dextran Anaphylaxis  . Dextrans   . Dextromethorphan Hbr Other (See Comments)    hallucinations  . Sulfa Antibiotics   . Tree Extract   . Penicillins Rash  . Sulfacetamide Sodium Rash     Past Medical History  Diagnosis Date  . Hyperlipidemia   . Hypertension   . Hypothyroid   . GERD (gastroesophageal reflux disease)   . Depression   . Diverticulosis     Past Surgical History    Procedure Laterality Date  . Total abdominal hysterectomy    . Rotator cuff repair    . Hand surgery    . Knee surgery    . Cataract extraction      Social History   Social History  . Marital Status: Married    Spouse Name: N/A  . Number of Children: 1  . Years of Education: N/A   Occupational History  . Not on file.   Social History Main Topics  . Smoking status: Never Smoker   . Smokeless tobacco: Never Used  . Alcohol Use: Yes     Comment: rare  . Drug Use: No  . Sexual Activity: Not on file   Other Topics Concern  . Not on file   Social History Narrative    Family History  Problem Relation Age of Onset  . Atrial fibrillation Mother   . Congestive Heart Failure Mother   . Heart disease Father     ROS: no fevers or chills, productive cough, hemoptysis, dysphasia, odynophagia, melena, hematochezia, dysuria, hematuria, rash, seizure activity, orthopnea, PND, pedal edema, claudication. Remaining systems are negative.  Physical Exam:   Blood pressure 151/78, pulse 60, height 5\' 1"  (1.549 m), weight 204 lb (92.534 kg).  General:  Well developed/obese in NAD Skin warm/dry Patient not depressed  No peripheral clubbing Back-normal HEENT-normal/normal eyelids Neck supple/normal carotid upstroke bilaterally; no bruits; no JVD; no thyromegaly chest - CTA/ normal expansion CV - RRR/normal S1 and S2; no murmurs, rubs or gallops;  PMI nondisplaced Abdomen -NT/ND, no HSM, no mass, + bowel sounds, no bruit 2+ femoral pulses, no bruits Ext-no edema, chords, 2+ DP Neuro-grossly nonfocal  ECG 10/31/2015-sinus bradycardia with no ST changes.

## 2015-11-10 ENCOUNTER — Ambulatory Visit (INDEPENDENT_AMBULATORY_CARE_PROVIDER_SITE_OTHER): Payer: Medicare Other | Admitting: Cardiology

## 2015-11-10 ENCOUNTER — Encounter: Payer: Self-pay | Admitting: Cardiology

## 2015-11-10 VITALS — BP 151/78 | HR 60 | Ht 61.0 in | Wt 204.0 lb

## 2015-11-10 DIAGNOSIS — R002 Palpitations: Secondary | ICD-10-CM

## 2015-11-10 DIAGNOSIS — I10 Essential (primary) hypertension: Secondary | ICD-10-CM | POA: Insufficient documentation

## 2015-11-10 DIAGNOSIS — E785 Hyperlipidemia, unspecified: Secondary | ICD-10-CM | POA: Insufficient documentation

## 2015-11-10 NOTE — Patient Instructions (Signed)
Medication Instructions:   NO CHANGE  Labwork:  Your physician recommends that you return for lab work WITH ECHO  Testing/Procedures:  Your physician has requested that you have an echocardiogram. Echocardiography is a painless test that uses sound waves to create images of your heart. It provides your doctor with information about the size and shape of your heart and how well your heart's chambers and valves are working. This procedure takes approximately one hour. There are no restrictions for this procedure.   Your physician has recommended that you wear an event monitor. Event monitors are medical devices that record the heart's electrical activity. Doctors most often [place] these monitors to diagnose arrhythmias. Arrhythmias are problems with the speed or rhythm of the heartbeat. The monitor is a small, portable device. You can wear one while you do your normal daily activities. This is usually used to diagnose what is causing palpitations/syncope (passing out).    Follow-Up:  Your physician recommends that you schedule a follow-up appointment in: [place]

## 2015-11-10 NOTE — Assessment & Plan Note (Signed)
Plan scheduled [facility] for further evaluation. Check TSH. Schedule echocardiogram to assess LV function.

## 2015-11-10 NOTE — Assessment & Plan Note (Signed)
Blood pressure mildly elevated. I have asked her to track this at home and we will advance medications as needed.

## 2015-11-10 NOTE — Assessment & Plan Note (Signed)
Continue statin. Management per primary care.

## 2015-11-12 ENCOUNTER — Other Ambulatory Visit (INDEPENDENT_AMBULATORY_CARE_PROVIDER_SITE_OTHER): Payer: Medicare Other | Admitting: *Deleted

## 2015-11-12 ENCOUNTER — Ambulatory Visit (HOSPITAL_COMMUNITY): Payer: Medicare Other | Attending: Cardiology

## 2015-11-12 ENCOUNTER — Other Ambulatory Visit: Payer: Self-pay

## 2015-11-12 DIAGNOSIS — E785 Hyperlipidemia, unspecified: Secondary | ICD-10-CM | POA: Insufficient documentation

## 2015-11-12 DIAGNOSIS — I340 Nonrheumatic mitral (valve) insufficiency: Secondary | ICD-10-CM | POA: Insufficient documentation

## 2015-11-12 DIAGNOSIS — R002 Palpitations: Secondary | ICD-10-CM

## 2015-11-12 DIAGNOSIS — E669 Obesity, unspecified: Secondary | ICD-10-CM | POA: Diagnosis not present

## 2015-11-12 DIAGNOSIS — I071 Rheumatic tricuspid insufficiency: Secondary | ICD-10-CM | POA: Diagnosis not present

## 2015-11-12 DIAGNOSIS — Z6838 Body mass index (BMI) 38.0-38.9, adult: Secondary | ICD-10-CM | POA: Diagnosis not present

## 2015-11-12 DIAGNOSIS — I119 Hypertensive heart disease without heart failure: Secondary | ICD-10-CM | POA: Diagnosis not present

## 2015-11-12 DIAGNOSIS — Z8249 Family history of ischemic heart disease and other diseases of the circulatory system: Secondary | ICD-10-CM | POA: Diagnosis not present

## 2015-11-13 LAB — TSH: TSH: 0.94 mIU/L

## 2015-11-15 ENCOUNTER — Ambulatory Visit (INDEPENDENT_AMBULATORY_CARE_PROVIDER_SITE_OTHER): Payer: Medicare Other

## 2015-11-15 DIAGNOSIS — R002 Palpitations: Secondary | ICD-10-CM

## 2015-12-08 ENCOUNTER — Encounter: Payer: Self-pay | Admitting: Family Medicine

## 2015-12-08 ENCOUNTER — Encounter: Payer: Self-pay | Admitting: Internal Medicine

## 2015-12-08 ENCOUNTER — Ambulatory Visit (INDEPENDENT_AMBULATORY_CARE_PROVIDER_SITE_OTHER): Payer: Medicare Other | Admitting: Family Medicine

## 2015-12-08 VITALS — BP 128/88 | HR 62 | Temp 98.7°F | Ht 61.0 in | Wt 205.6 lb

## 2015-12-08 DIAGNOSIS — K219 Gastro-esophageal reflux disease without esophagitis: Secondary | ICD-10-CM | POA: Diagnosis not present

## 2015-12-08 DIAGNOSIS — I10 Essential (primary) hypertension: Secondary | ICD-10-CM

## 2015-12-08 DIAGNOSIS — F4389 Other reactions to severe stress: Secondary | ICD-10-CM

## 2015-12-08 DIAGNOSIS — F4320 Adjustment disorder, unspecified: Secondary | ICD-10-CM | POA: Diagnosis not present

## 2015-12-08 DIAGNOSIS — F438 Other reactions to severe stress: Secondary | ICD-10-CM

## 2015-12-08 NOTE — Progress Notes (Signed)
[facility] [address] [place], [place] [address] [phone]  Date:  12/08/2015   Name:  Lisa Acosta   DOB:  15-Jun-1951   MRN:  [id number]  PCP:  Lamar Blinks, MD    Chief Complaint: New Patient (Initial Visit)   History of Present Illness:  Lisa Acosta is a 65 y.o. very pleasant female patient who presents with the following:  Here today to establish care.  History of hypertension, hyperlipidemia, depression, GERD, insomina.    She does have extensive diverticulosis/ one episode of diverticulitis a few years ago, and had an esophageal ulcer 1-2 years ago.   S/p hysterectomy.   She had been seeing Dr. Alice Reichert at [facility] but he is now moved- I will refer to a new GI doctor today.  She does feel like she cannot swallow some of the time, like there is congestion in her throat.  She does not have anything getting stuck but feels like her throat is chronically irritated She last saw GI last year.    Dr. Stanford Breed has her on a heart monitor right now for palpitations.   She did do a nuclear stress per [facility]- most recently done when she had the ulcer and was having chest pain.  07/2014?  This looked ok.   She notes some sx of feeling cold and tired- however a recent TSH was normal.    Her mood sx- "I manage.  Life is hard.  My husband has MS and the beginnings of dementia.  He is difficult to live with.  I also have a special needs grandson."  She is working full time but would like to go down to part time.   Her daughter and SIl are nearby.  She keeps her grandson a lot of the time.  He is 15 but is isolated due to his multiple physical and behavorial disabilities.   Her mother lives in [place]- she was recently sick with CHF but is improving.    She has 2 brothers and a sister.   She is sleeping ok with the trazodone.   No SI  Patient Active Problem List   Diagnosis Date Noted  . Palpitations  11/10/2015  . Essential hypertension 11/10/2015  . Hyperlipidemia 11/10/2015    Past Medical History  Diagnosis Date  . Hyperlipidemia   . Hypertension   . Hypothyroid   . GERD (gastroesophageal reflux disease)   . Depression   . Diverticulosis     Past Surgical History  Procedure Laterality Date  . Total abdominal hysterectomy    . Rotator cuff repair    . Hand surgery    . Knee surgery    . Cataract extraction      Social History  Substance Use Topics  . Smoking status: Never Smoker   . Smokeless tobacco: Never Used  . Alcohol Use: Yes     Comment: rare    Family History  Problem Relation Age of Onset  . Atrial fibrillation Mother   . Congestive Heart Failure Mother   . Heart disease Father   . Heart disease Brother   . Heart disease Brother     Allergies  Allergen Reactions  . Dextran Anaphylaxis  . Dextrans   . Dextromethorphan Hbr Other (See Comments)    hallucinations  . Sulfa Antibiotics   . Tree Extract   . Penicillins Rash  . Sulfacetamide Sodium Rash    Medication list has been reviewed  and updated.  Current Outpatient Prescriptions on File Prior to Visit  Medication Sig Dispense Refill  . atenolol (TENORMIN) 25 MG tablet Take by mouth daily.    . beta carotene w/minerals (OCUVITE) tablet Take 1 tablet by mouth daily.    [name] FLUoxetine (PROZAC) 40 MG capsule Take 40 mg by mouth daily.    Javier Docker Oil 1000 MG CAPS Take 1 capsule by mouth daily.    [name] lactobacillus acidophilus (BACID) TABS tablet Take 1 tablet by mouth daily.    [name] levothyroxine (SYNTHROID, LEVOTHROID) 125 MCG tablet Take 125 mcg by mouth daily before breakfast.    . lovastatin (ALTOPREV) 40 MG 24 hr tablet Take 40 mg by mouth at bedtime.    . magnesium 30 MG tablet Take 30 mg by mouth daily.    . pantoprazole (PROTONIX) 40 MG tablet Take 40 mg by mouth daily.    . Potassium 75 MG TABS Take 1 tablet by mouth daily.    . traZODone (DESYREL) 50 MG tablet Take 50 mg by mouth at  bedtime.    . Turmeric 450 MG CAPS Take 1 capsule by mouth daily.    . vitamin B-12 (CYANOCOBALAMIN) 100 MCG tablet Take 100 mcg by mouth daily.     No current facility-administered medications on file prior to visit.    Review of Systems:  As per HPI- otherwise negative.   Physical Examination: Filed Vitals:   12/08/15 0925  BP: 128/88  Pulse: 62  Temp: 98.7 F (37.1 C)   Filed Vitals:   12/08/15 0925  Height: 5\' 1"  (1.549 m)  Weight: 205 lb 9.6 oz (93.26 kg)   Body mass index is 38.87 kg/(m^2). Ideal Body Weight: Weight in (lb) to have BMI = 25: 132  GEN: WDWN, NAD, Non-toxic, A & O x 3, obese, looks well HEENT: Atraumatic, Normocephalic. Neck supple. No masses, No LAD. Ears and Nose: No external deformity. CV: RRR, No M/G/R. No JVD. No thrill. No extra heart sounds. PULM: CTA B, no wheezes, crackles, rhonchi. No retractions. No resp. distress. No accessory muscle use. EXTR: No c/c/e NEURO Normal gait.  PSYCH: Normally interactive. Conversant. Not depressed or anxious appearing.  Calm demeanor.    Assessment and Plan: Gastroesophageal reflux disease, esophagitis presence not specified - Plan: Ambulatory referral to Gastroenterology  Stress reaction, chronic  Essential hypertension  Here today to establish care She is under a lot of stress- doing ok with her current medications but we hope that she will be able to find part time work.  This will take a lot of pressure off of her Referral to GI as she needs to establish and also has noted sx of esophageal irritation BP is controlled with current meds- continue Plan to recheck in 6 months   Signed Lamar Blinks, MD

## 2015-12-08 NOTE — Patient Instructions (Signed)
It was nice to meet you today!  I will refer you to a GI doctor with [facility].   Let's plan to meet again for a physical in 6 months or so Let me know if you need anything in the meantime and we will request your [facility] records

## 2015-12-17 DIAGNOSIS — H3582 Retinal ischemia: Secondary | ICD-10-CM | POA: Diagnosis not present

## 2015-12-17 DIAGNOSIS — H43813 Vitreous degeneration, bilateral: Secondary | ICD-10-CM | POA: Diagnosis not present

## 2015-12-17 DIAGNOSIS — H34831 Tributary (branch) retinal vein occlusion, right eye, with macular edema: Secondary | ICD-10-CM | POA: Diagnosis not present

## 2016-01-03 NOTE — Progress Notes (Signed)
HPI: FU palpitations. Laboratories February 2017 showed potassium 3.5 and normal hemoglobin. Monitor March 2017 showed sinus rhythm with paroxysmal atrial fibrillation. Echocardiogram March 2017 showed normal LV systolic function, Grade 1 diastolic dysfunction, trace mitral and tricuspid regurgitation. TSH March 2017 normal at 0.94. Since last seen, She has had occasional brief palpitations. These are approximately twice monthly. She has dyspnea on exertion but no orthopnea, PND, pedal edema, chest pain or syncope. No history of bleeding.  Current Outpatient Prescriptions  Medication Sig Dispense Refill  . atenolol (TENORMIN) 25 MG tablet Take by mouth daily.    . beta carotene w/minerals (OCUVITE) tablet Take 1 tablet by mouth daily.    [name] FLUoxetine (PROZAC) 40 MG capsule Take 40 mg by mouth 2 (two) times daily.     Javier Docker Oil 1000 MG CAPS Take 1 capsule by mouth daily.    [name] lactobacillus acidophilus (BACID) TABS tablet Take 1 tablet by mouth daily.    [name] levothyroxine (SYNTHROID, LEVOTHROID) 125 MCG tablet Take 125 mcg by mouth daily before breakfast.    . lovastatin (ALTOPREV) 40 MG 24 hr tablet Take 40 mg by mouth at bedtime.    . magnesium 30 MG tablet Take 30 mg by mouth daily.    . pantoprazole (PROTONIX) 40 MG tablet Take 40 mg by mouth daily.    . Potassium 75 MG TABS Take 1 tablet by mouth daily.    . traZODone (DESYREL) 50 MG tablet Take 50 mg by mouth at bedtime.    . Turmeric 450 MG CAPS Take 1 capsule by mouth daily.    [name] UNABLE TO FIND Take 1 tablet by mouth daily. Occusight (Eye Med)    . vitamin B-12 (CYANOCOBALAMIN) 100 MCG tablet Take 100 mcg by mouth daily.     No current facility-administered medications for this visit.     Past Medical History  Diagnosis Date  . Hyperlipidemia   . Hypertension   . Hypothyroid   . GERD (gastroesophageal reflux disease)   . Depression   . Diverticulosis   . Atrial fibrillation [facility])     Past Surgical History  Procedure  Laterality Date  . Total abdominal hysterectomy    . Rotator cuff repair    . Hand surgery    . Knee surgery    . Cataract extraction      Social History   Social History  . Marital Status: Married    Spouse Name: Jenny Reichmann  . Number of Children: 1  . Years of Education: College   Occupational History  .  Other    [place] History Main Topics  . Smoking status: Never Smoker   . Smokeless tobacco: Never Used  . Alcohol Use: Yes     Comment: rare  . Drug Use: No  . Sexual Activity: Not on file   Other Topics Concern  . Not on file   Social History Narrative   Patient lives at home spouse.   Caffeine use: 2 sodas weelky    Family History  Problem Relation Age of Onset  . Atrial fibrillation Mother   . Congestive Heart Failure Mother   . Heart disease Father   . Heart disease Brother   . Heart disease Brother     ROS: no fevers or chills, productive cough, hemoptysis, dysphasia, odynophagia, melena, hematochezia, dysuria, hematuria, rash, seizure activity, orthopnea, PND, pedal edema, claudication. Remaining systems are negative.  Physical Exam: Well-developed obese in no  acute distress.  Skin is warm and dry.  HEENT is normal.  Neck is supple.  Chest is clear to auscultation with normal expansion.  Cardiovascular exam is regular rate and rhythm.  Abdominal exam nontender or distended. No masses palpated. Extremities show no edema. neuro grossly intact

## 2016-01-04 DIAGNOSIS — H903 Sensorineural hearing loss, bilateral: Secondary | ICD-10-CM | POA: Diagnosis not present

## 2016-01-05 ENCOUNTER — Encounter: Payer: Self-pay | Admitting: Cardiology

## 2016-01-05 ENCOUNTER — Ambulatory Visit (INDEPENDENT_AMBULATORY_CARE_PROVIDER_SITE_OTHER): Payer: Medicare Other | Admitting: Cardiology

## 2016-01-05 VITALS — BP 112/71 | HR 57 | Ht 61.0 in | Wt 205.0 lb

## 2016-01-05 DIAGNOSIS — R002 Palpitations: Secondary | ICD-10-CM

## 2016-01-05 DIAGNOSIS — E785 Hyperlipidemia, unspecified: Secondary | ICD-10-CM | POA: Diagnosis not present

## 2016-01-05 DIAGNOSIS — I480 Paroxysmal atrial fibrillation: Secondary | ICD-10-CM

## 2016-01-05 DIAGNOSIS — I10 Essential (primary) hypertension: Secondary | ICD-10-CM | POA: Diagnosis not present

## 2016-01-05 DIAGNOSIS — I4891 Unspecified atrial fibrillation: Secondary | ICD-10-CM | POA: Insufficient documentation

## 2016-01-05 MED ORDER — APIXABAN 5 MG PO TABS: 5.0000 mg | ORAL_TABLET | Freq: Two times a day (BID) | ORAL | Status: DC

## 2016-01-05 MED ORDER — ATENOLOL 25 MG PO TABS: 25.0000 mg | ORAL_TABLET | Freq: Two times a day (BID) | ORAL | Status: DC

## 2016-01-05 NOTE — Assessment & Plan Note (Signed)
Continue statin. 

## 2016-01-05 NOTE — Assessment & Plan Note (Addendum)
Patient has paroxysmal atrial fibrillation. Embolic risk factors include hypertension, female sex and age 65. CHADSvasc 3. Add apixaban 5 mg BID; check Hgb and renal function 4 weeks. Continue atenolol but increase to 25 BID. If she has more frequent symptomatic episodes in the future we will consider adding an antiarrhythmic such as flecainide.

## 2016-01-05 NOTE — Assessment & Plan Note (Signed)
Blood pressure controlled. Continue present medications. 

## 2016-01-05 NOTE — Assessment & Plan Note (Deleted)
Plan to continue beta blocker. Symptoms reasonably well controlled.

## 2016-01-05 NOTE — Patient Instructions (Signed)
Medication Instructions:   INCREASE ATENOLOL TO 25 MG TWICE DAILY  START ELIQUIS 5 MG ONE TABLET TWICE DAILY  Labwork:  Your physician recommends that you return for lab work in: [place]:  Your physician recommends that you schedule a follow-up appointment in: [place]

## 2016-01-10 ENCOUNTER — Telehealth: Payer: Self-pay

## 2016-01-10 NOTE — Telephone Encounter (Signed)
Prior auth for Eliquis 5 mg submitted to [employer]. 

## 2016-01-11 ENCOUNTER — Telehealth: Payer: Self-pay | Admitting: Cardiology

## 2016-01-11 NOTE — Telephone Encounter (Signed)
New message   This is for authorization for the pt medication, per Tawni Pummel B @the  insurance company, Eliquis 360 for January 31-April 20th, this is for Dr. 07-03-1992 PA Jacalyn Lefevre

## 2016-01-14 ENCOUNTER — Telehealth: Payer: Self-pay

## 2016-01-14 NOTE — Telephone Encounter (Signed)
Eliquis approved through 01/2019.

## 2016-01-19 DIAGNOSIS — N63 Unspecified lump in breast: Secondary | ICD-10-CM | POA: Diagnosis not present

## 2016-01-19 DIAGNOSIS — Z09 Encounter for follow-up examination after completed treatment for conditions other than malignant neoplasm: Secondary | ICD-10-CM | POA: Diagnosis not present

## 2016-01-24 DIAGNOSIS — Z6838 Body mass index (BMI) 38.0-38.9, adult: Secondary | ICD-10-CM | POA: Diagnosis not present

## 2016-01-24 DIAGNOSIS — C801 Malignant (primary) neoplasm, unspecified: Secondary | ICD-10-CM | POA: Diagnosis not present

## 2016-01-24 DIAGNOSIS — R928 Other abnormal and inconclusive findings on diagnostic imaging of breast: Secondary | ICD-10-CM | POA: Diagnosis not present

## 2016-01-24 DIAGNOSIS — N6022 Fibroadenosis of left breast: Secondary | ICD-10-CM | POA: Diagnosis not present

## 2016-01-27 ENCOUNTER — Telehealth: Payer: Self-pay | Admitting: *Deleted

## 2016-01-27 MED ORDER — RIVAROXABAN 20 MG PO TABS: 20.0000 mg | ORAL_TABLET | Freq: Every day | ORAL | Status: DC

## 2016-01-27 NOTE — Telephone Encounter (Signed)
Received paperwork from [facility], pt is currently on eliquis and per her insurance xarelto would be on a lower tier for her. Reviewed by dr Stanford Breed, pt changed to xarelto 20 mg once daily. Patient voiced understanding to finish what she has and then change to the xarelto.

## 2016-01-31 ENCOUNTER — Ambulatory Visit: Payer: Medicare Other | Admitting: Internal Medicine

## 2016-02-04 DIAGNOSIS — L578 Other skin changes due to chronic exposure to nonionizing radiation: Secondary | ICD-10-CM | POA: Diagnosis not present

## 2016-02-04 DIAGNOSIS — D2372 Other benign neoplasm of skin of left lower limb, including hip: Secondary | ICD-10-CM | POA: Diagnosis not present

## 2016-02-04 DIAGNOSIS — L989 Disorder of the skin and subcutaneous tissue, unspecified: Secondary | ICD-10-CM | POA: Diagnosis not present

## 2016-02-04 DIAGNOSIS — Z6838 Body mass index (BMI) 38.0-38.9, adult: Secondary | ICD-10-CM | POA: Diagnosis not present

## 2016-02-04 DIAGNOSIS — R928 Other abnormal and inconclusive findings on diagnostic imaging of breast: Secondary | ICD-10-CM | POA: Diagnosis not present

## 2016-02-04 DIAGNOSIS — B079 Viral wart, unspecified: Secondary | ICD-10-CM | POA: Diagnosis not present

## 2016-02-05 DIAGNOSIS — L989 Disorder of the skin and subcutaneous tissue, unspecified: Secondary | ICD-10-CM | POA: Insufficient documentation

## 2016-02-10 ENCOUNTER — Telehealth: Payer: Self-pay | Admitting: Family Medicine

## 2016-02-10 DIAGNOSIS — M19041 Primary osteoarthritis, right hand: Secondary | ICD-10-CM

## 2016-02-10 DIAGNOSIS — M19042 Primary osteoarthritis, left hand: Secondary | ICD-10-CM

## 2016-02-10 NOTE — Telephone Encounter (Signed)
Called and LMOM_ I will take care of referral for her

## 2016-02-10 NOTE — Telephone Encounter (Signed)
Can be reached: [phone]   Reason for call: Pt would like referral to arthritis specialist stating the pain in her hands/fingers has just gotten too back.

## 2016-02-11 ENCOUNTER — Emergency Department (HOSPITAL_BASED_OUTPATIENT_CLINIC_OR_DEPARTMENT_OTHER)
Admission: EM | Admit: 2016-02-11 | Discharge: 2016-02-11 | Disposition: A | Discharge status: Home / Self Care | Payer: Medicare Other | Attending: Emergency Medicine | Admitting: Emergency Medicine

## 2016-02-11 ENCOUNTER — Telehealth: Payer: Self-pay | Admitting: Family Medicine

## 2016-02-11 ENCOUNTER — Encounter (HOSPITAL_BASED_OUTPATIENT_CLINIC_OR_DEPARTMENT_OTHER): Payer: Self-pay | Admitting: *Deleted

## 2016-02-11 ENCOUNTER — Telehealth: Payer: Self-pay | Admitting: Cardiology

## 2016-02-11 DIAGNOSIS — I10 Essential (primary) hypertension: Secondary | ICD-10-CM | POA: Insufficient documentation

## 2016-02-11 DIAGNOSIS — F329 Major depressive disorder, single episode, unspecified: Secondary | ICD-10-CM | POA: Diagnosis not present

## 2016-02-11 DIAGNOSIS — I4891 Unspecified atrial fibrillation: Secondary | ICD-10-CM | POA: Insufficient documentation

## 2016-02-11 DIAGNOSIS — R519 Headache, unspecified: Secondary | ICD-10-CM

## 2016-02-11 DIAGNOSIS — R51 Headache: Secondary | ICD-10-CM | POA: Insufficient documentation

## 2016-02-11 DIAGNOSIS — E785 Hyperlipidemia, unspecified: Secondary | ICD-10-CM | POA: Insufficient documentation

## 2016-02-11 DIAGNOSIS — Z79899 Other long term (current) drug therapy: Secondary | ICD-10-CM | POA: Insufficient documentation

## 2016-02-11 DIAGNOSIS — E039 Hypothyroidism, unspecified: Secondary | ICD-10-CM | POA: Insufficient documentation

## 2016-02-11 MED ORDER — HYDROCHLOROTHIAZIDE 12.5 MG PO TABS: 12.5000 mg | ORAL_TABLET | Freq: Every day | ORAL | Status: DC

## 2016-02-11 MED FILL — HYDROCHLOROTHIAZIDE 12.5 MG: 12.5 | 30 days supply | Qty: 30 | Fill #0

## 2016-02-11 NOTE — ED Notes (Signed)
Patient states she developed dizziness three days ago, which resolved by the evening time, which is associated with headache and sinus pain.  States she has not felt well all week, and was recently diagnosed with A-fib and placed on blood thinners.  States her bp has been elevated for the last several days, 170 / 80ish.

## 2016-02-11 NOTE — Telephone Encounter (Signed)
Patient Name: Lisa Acosta DOB: 1951/07/12 Initial Comment Caller states having dizziness, 175/90 bp this morning. She was very dizzy on Tuesday, felt like she was going to pass out, starting to get sinus headache. She is not feeling like going to pass out this morning. Nurse Assessment Nurse: Vallery Sa, RN, Cathy Date/Time (Eastern Time): 02/11/2016 9:17:07 AM Confirm and document reason for call. If symptomatic, describe symptoms. You must click the next button to save text entered. ---Caller states her blood pressure was 175/90 this morning. She developed another headache yesterday (rated as a 5 on the 1 to 10 scale). No fever. No injury in the past 3 days. She states she developed shortness of breath again this morning. No chest pain. Alert and responsive. Has the patient traveled out of the country within the last 30 days? ---No Does the patient have any new or worsening symptoms? ---Yes Will a triage be completed? ---Yes Related visit to physician within the last 2 weeks? ---No Does the PT have any chronic conditions? (i.e. diabetes, asthma, etc.) ---Yes List chronic conditions. ---Skin lesion removed, A-Fib, Diverticulosis, high Blood Pressure and Cholesterol, Is this a behavioral health or substance abuse call? ---No Guidelines Guideline Title Affirmed Question Affirmed Notes Breathing Difficulty Extra heart beats OR irregular heart beating (i.e., "palpitations") Final Disposition User Go to ED Now Vallery Sa, RN, [facility] Referrals [facility] - ED Disagree/Comply: Comply

## 2016-02-11 NOTE — Telephone Encounter (Signed)
New message    Pt c/o BP issue: STAT if pt c/o blurred vision, one-sided weakness or slurred speech  1. What are your last 5 BP readings? Last night 173/80,this am172/82 and again175/90  2. Are you having any other symptoms (ex. Dizziness, headache, blurred vision, passed out)? Dizziness omly  3. What is your BP issue? The pt states they have felt well at all, the pt wants to let the nurse know

## 2016-02-11 NOTE — Telephone Encounter (Signed)
Spoke with pt, this started on Tuesday, she had some dizziness in the morning but by the end of the day was gone. She has a skin lesion removed last week and they commented about the elevated bp. She then bought a bp machine and the readings have been elevated. She has had no more dizziness but has a headache. Her pulse is 58 and she has taken her atenolol today. She did eat chinese earlier this week but has been drinking plenty of fluids. We really can not increase the atenolol due to resting heart rate. Pt referred to medical doctor.

## 2016-02-11 NOTE — ED Provider Notes (Signed)
CSN: [id number]     Arrival date & time 02/11/16  [id number] History   First MD Initiated Contact with Patient 02/11/16 [phone]     Chief Complaint  Patient presents with  . Headache     (Consider location/radiation/quality/duration/timing/severity/associated sxs/prior Treatment) HPI Comments: The patient is a 65 year old female, she has a history of hypertension, a recent history of atrial fibrillation for which she has been placed on Xarelto and continues to take atenolol. She states that approximately 4 days ago she developed a headache with dizziness described as a vertiginous feeling of the room spinning. She felt as though she was unable to control her balance. She states that is completely resolved at this point and now she is left with a headache which feels like a pressure in her face over her eyes and over her cheek bones. She denies any other symptoms of sinus infection including drainage, discharge, sore throat, cough, shortness of breath, fevers or chills. She reports that this morning she took her blood pressure which was [id number] systolic, and gradually rose up to [id number] systolic, this was very concerning for her with her headache, she called her family doctor who recommended that she come to the emergency department.  Patient is a 65 y.o. female presenting with headaches. The history is provided by the patient.  Headache   Past Medical History  Diagnosis Date  . Hyperlipidemia   . Hypertension   . Hypothyroid   . GERD (gastroesophageal reflux disease)   . Depression   . Diverticulosis   . Atrial fibrillation [facility])    Past Surgical History  Procedure Laterality Date  . Total abdominal hysterectomy    . Rotator cuff repair    . Hand surgery    . Knee surgery    . Cataract extraction     Family History  Problem Relation Age of Onset  . Atrial fibrillation Mother   . Congestive Heart Failure Mother   . Heart disease Father   . Heart disease Brother   . Heart disease Brother     Social History  Substance Use Topics  . Smoking status: Never Smoker   . Smokeless tobacco: Never Used  . Alcohol Use: Yes     Comment: rare   OB History    No data available     Review of Systems  Neurological: Positive for headaches.  All other systems reviewed and are negative.     Allergies  Dextran; Dextrans; Dextromethorphan hbr; Sulfa antibiotics; Tree extract; Penicillins; and Sulfacetamide sodium  Home Medications   Prior to Admission medications   Medication Sig Start Date End Date Taking? Authorizing Provider  atenolol (TENORMIN) 25 MG tablet Take 1 tablet (25 mg total) by mouth 2 (two) times daily. 01/05/16   Lelon Perla, MD  beta carotene w/minerals (OCUVITE) tablet Take 1 tablet by mouth daily.    Historical Provider, MD  FLUoxetine (PROZAC) 40 MG capsule Take 40 mg by mouth 2 (two) times daily.     Historical Provider, MD  hydrochlorothiazide (HYDRODIURIL) 12.5 MG tablet Take 1 tablet (12.5 mg total) by mouth daily. 02/11/16   Noemi Chapel, MD  Krill Oil 1000 MG CAPS Take 1 capsule by mouth daily.    Historical Provider, MD  lactobacillus acidophilus (BACID) TABS tablet Take 1 tablet by mouth daily.    Historical Provider, MD  levothyroxine (SYNTHROID, LEVOTHROID) 125 MCG tablet Take 125 mcg by mouth daily before breakfast.    Historical Provider, MD  lovastatin (ALTOPREV) 40 MG 24  hr tablet Take 40 mg by mouth at bedtime.    Historical Provider, MD  magnesium 30 MG tablet Take 30 mg by mouth daily.    Historical Provider, MD  pantoprazole (PROTONIX) 40 MG tablet Take 40 mg by mouth daily.    Historical Provider, MD  Potassium 75 MG TABS Take 1 tablet by mouth daily.    Historical Provider, MD  rivaroxaban (XARELTO) 20 MG TABS tablet Take 1 tablet (20 mg total) by mouth daily with supper. 01/27/16   Lelon Perla, MD  traZODone (DESYREL) 50 MG tablet Take 50 mg by mouth at bedtime.    Historical Provider, MD  Turmeric 450 MG CAPS Take 1 capsule by mouth  daily.    Historical Provider, MD  UNABLE TO FIND Take 1 tablet by mouth daily. Occusight (Eye Med)    Historical Provider, MD  vitamin B-12 (CYANOCOBALAMIN) 100 MCG tablet Take 100 mcg by mouth daily.    Historical Provider, MD   BP 157/88 mmHg  Pulse 55  Temp(Src) 99 F (37.2 C) (Oral)  Resp 20  Ht 4\' 3"  (1.295 m)  Wt 200 lb (90.719 kg)  BMI 54.10 kg/m2  SpO2 97% Physical Exam  Constitutional: She appears well-developed and well-nourished. No distress.  HENT:  Head: Normocephalic and atraumatic.  Mouth/Throat: Oropharynx is clear and moist. No oropharyngeal exudate.  Tympanic membranes are clear bilaterally, nasal passages are open and clear, oropharynx is clear and moist, no exudate asymmetry or hypertrophy. Phonation is normal  Eyes: Conjunctivae and EOM are normal. Pupils are equal, round, and reactive to light. Right eye exhibits no discharge. Left eye exhibits no discharge. No scleral icterus.  Neck: Normal range of motion. Neck supple. No JVD present. No thyromegaly present.  Cardiovascular: Normal rate, regular rhythm, normal heart sounds and intact distal pulses.  Exam reveals no gallop and no friction rub.   No murmur heard. Pulmonary/Chest: Effort normal and breath sounds normal. No respiratory distress. She has no wheezes. She has no rales.  Abdominal: Soft. Bowel sounds are normal. She exhibits no distension and no mass. There is no tenderness.  Musculoskeletal: Normal range of motion. She exhibits no edema or tenderness.  Lymphadenopathy:    She has no cervical adenopathy.  Neurological: She is alert. Coordination normal.  Speech is clear, cranial nerves III through XII are intact, memory is intact, strength is normal in all 4 extremities including grips, sensation is intact to light touch and pinprick in all 4 extremities. Coordination as tested by finger-nose-finger is normal, no limb ataxia. Normal gait, normal reflexes at the patellar tendons bilaterally  Skin: Skin  is warm and dry. No rash noted. No erythema.  Psychiatric: She has a normal mood and affect. Her behavior is normal.  Nursing note and vitals reviewed.   ED Course  Procedures (including critical care time) Labs Review Labs Reviewed - No data to display  Imaging Review No results found. I have personally reviewed and evaluated these images and lab results as part of my medical decision-making.   EKG Interpretation   Date/Time:  Friday February 11 2016 10:02:11 EDT Ventricular Rate:  52 PR Interval:  197 QRS Duration: 87 QT Interval:  433 QTC Calculation: 403 R Axis:   47 Text Interpretation:  Sinus rhythm Low voltage, precordial leads Since  last tracing rate slower Confirmed by Jamez Ambrocio  MD, Bitania Shankland ([id number]) on  02/11/2016 10:08:20 AM      MDM   Final diagnoses:  Essential hypertension  Nonintractable headache, unspecified  chronicity pattern, unspecified headache type    The patient does have a slight bradycardia, otherwise the vital signs show minimal hypertension at 155/78 on my exam. Her EKG is unremarkable, sinus bradycardia only. I do not see any indication for further evaluation, I have reviewed her prior record including from February at which time her metabolic panel was normal and she had no signs of kidney dysfunction. I've asked the patient to use Tylenol for her headache, fluticasone nasal spray daily to help with any kind of sinus congestion and an antihistamine such as Allegra. She will add hydrochlorothiazide to her home blood pressure medications only if she is developing increasing blood pressures. I've asked her to take her blood pressure once a day 2 hours after taking her morning medications. She appears well, she is neurologically intact and has no chest pain or shortness of breath, she is stable for discharge and expressed her understanding to the indications for return. She will follow up within 2 weeks for recheck blood pressure.  Meds given in ED:  Medications  - No data to display  New Prescriptions   HYDROCHLOROTHIAZIDE (HYDRODIURIL) 12.5 MG TABLET    Take 1 tablet (12.5 mg total) by mouth daily.        Noemi Chapel, MD 02/11/16 [phone]

## 2016-02-11 NOTE — Telephone Encounter (Signed)
Relationship to patient: self Can be reached: [phone]  Reason for call: Pt called from work # stating she has been having dizziness, BP high, and sinus pain. Pt states this morning BP 175/90. Has had dizziness since Tuesday at times feeling faint. Transferred to [facility] with [facility].

## 2016-02-11 NOTE — Discharge Instructions (Signed)
Tylenol for headache Antihistamine (allegra) daily Flonase (Fluticasone) daily Take your blood perssure once daily 2 hours after your morning medicines Use the HCTZ medicine for severe elevation in your blood pressure Seek medical care for worsening headache, vomiting, changes in vision, chest pain or other worsening symptoms.

## 2016-02-15 ENCOUNTER — Encounter: Payer: Self-pay | Admitting: Family Medicine

## 2016-02-24 ENCOUNTER — Encounter: Payer: Self-pay | Admitting: Family Medicine

## 2016-02-24 ENCOUNTER — Ambulatory Visit (INDEPENDENT_AMBULATORY_CARE_PROVIDER_SITE_OTHER): Payer: Medicare Other | Admitting: Family Medicine

## 2016-02-24 VITALS — BP 140/85 | HR 60 | Temp 98.2°F | Ht 61.0 in | Wt 207.0 lb

## 2016-02-24 DIAGNOSIS — G4733 Obstructive sleep apnea (adult) (pediatric): Secondary | ICD-10-CM

## 2016-02-24 DIAGNOSIS — I10 Essential (primary) hypertension: Secondary | ICD-10-CM | POA: Diagnosis not present

## 2016-02-24 DIAGNOSIS — M19041 Primary osteoarthritis, right hand: Secondary | ICD-10-CM

## 2016-02-24 DIAGNOSIS — I480 Paroxysmal atrial fibrillation: Secondary | ICD-10-CM | POA: Diagnosis not present

## 2016-02-24 DIAGNOSIS — M19042 Primary osteoarthritis, left hand: Secondary | ICD-10-CM | POA: Diagnosis not present

## 2016-02-24 DIAGNOSIS — Z9989 Dependence on other enabling machines and devices: Secondary | ICD-10-CM

## 2016-02-24 NOTE — Patient Instructions (Signed)
We will refer you to a nutritionist to discuss diet and sodium to help you control your BP For the time being your BP and pulse look ok; please let me know if you have any other concerns and let's plan to meet for a recheck in 3-4 months

## 2016-02-24 NOTE — Progress Notes (Signed)
Pre visit review using our clinic review tool, if applicable. No additional management support is needed unless otherwise documented below in the visit note. 

## 2016-02-24 NOTE — Progress Notes (Addendum)
[place] at [facility] [address], [place], [place] [address] [phone]  Date:  02/24/2016   Name:  Lisa Acosta   DOB:  Dec 06, 1950   MRN:  [id number]  PCP:  Lamar Blinks, MD    Chief Complaint: Follow-up   History of Present Illness:  Lisa Acosta is a 65 y.o. very pleasant female patient who presents with the following:  She was seen in the ER about 10 days ago with complaint of headache and room spinning feeling.  She had called this office for advice and was directed to the ED.  She was evaluated and allowed to go home.   She has a history of paroxysmal a fib followed by Dr. Stanford Breed- she is on atenolol, xarelto for this condition.  She will notice intermittent palpitations which she thinks indicate episodes of a fib.   BP Readings from Last 3 Encounters:  02/24/16 156/67  02/11/16 154/74  01/05/16 112/71   Her BP had been generally well controlled in the past.   She noted that her BP was high still for a couple of days after her ER visit, but this then seemed to resolve and her readings are back to normal  At home she is running 140s/70.   She took HCTZ once last week once, but is otherwise not taking this as her BP has not demanded it Her HA are much better, dizziness is better.    Not 100% gone but much improved  She is interested in seeing a nutritionist to discuss her diet and health, and to get advice on avoiding salt   addnd 9/29: pt reports that she is using CPAP for OSA and is benefiting from this treatment. She needs a new face mask as her current mask is causing skin problmes  Pulse Readings from Last 3 Encounters:  02/24/16 95  02/11/16 51  01/05/16 57   Wt Readings from Last 3 Encounters:  02/24/16 207 lb (93.895 kg)  02/11/16 200 lb (90.719 kg)  01/05/16 205 lb 0.6 oz (93.006 kg)     Patient Active Problem List   Diagnosis Date Noted  . Atrial fibrillation ([place]) 01/05/2016  .  Palpitations 11/10/2015  . Essential hypertension 11/10/2015  . Hyperlipidemia 11/10/2015    Past Medical History  Diagnosis Date  . Hyperlipidemia   . Hypertension   . Hypothyroid   . GERD (gastroesophageal reflux disease)   . Depression   . Diverticulosis   . Atrial fibrillation [facility]     Past Surgical History  Procedure Laterality Date  . Total abdominal hysterectomy    . Rotator cuff repair    . Hand surgery    . Knee surgery    . Cataract extraction      Social History  Substance Use Topics  . Smoking status: Never Smoker   . Smokeless tobacco: Never Used  . Alcohol Use: Yes     Comment: rare    Family History  Problem Relation Age of Onset  . Atrial fibrillation Mother   . Congestive Heart Failure Mother   . Heart disease Father   . Heart disease Brother   . Heart disease Brother     Allergies  Allergen Reactions  . Dextran Anaphylaxis  . Dextrans   . Dextromethorphan Hbr Other (See Comments)    hallucinations  . Sulfa Antibiotics   . Tree Extract   . Penicillins Rash  . Sulfacetamide Sodium Rash    Medication list  has been reviewed and updated.  Current Outpatient Prescriptions on File Prior to Visit  Medication Sig Dispense Refill  . atenolol (TENORMIN) 25 MG tablet Take 1 tablet (25 mg total) by mouth 2 (two) times daily. 180 tablet 3  . beta carotene w/minerals (OCUVITE) tablet Take 1 tablet by mouth daily.    [name] FLUoxetine (PROZAC) 40 MG capsule Take 40 mg by mouth 2 (two) times daily.     . hydrochlorothiazide (HYDRODIURIL) 12.5 MG tablet Take 1 tablet (12.5 mg total) by mouth daily. 30 tablet 1  . Krill Oil 1000 MG CAPS Take 1 capsule by mouth daily.    [name] lactobacillus acidophilus (BACID) TABS tablet Take 1 tablet by mouth daily.    [name] levothyroxine (SYNTHROID, LEVOTHROID) 125 MCG tablet Take 125 mcg by mouth daily before breakfast.    . lovastatin (ALTOPREV) 40 MG 24 hr tablet Take 40 mg by mouth at bedtime.    . magnesium 30 MG tablet  Take 30 mg by mouth daily.    . pantoprazole (PROTONIX) 40 MG tablet Take 40 mg by mouth daily.    . Potassium 75 MG TABS Take 1 tablet by mouth daily.    . rivaroxaban (XARELTO) 20 MG TABS tablet Take 1 tablet (20 mg total) by mouth daily with supper. 90 tablet 3  . traZODone (DESYREL) 50 MG tablet Take 50 mg by mouth at bedtime.    . Turmeric 450 MG CAPS Take 1 capsule by mouth daily.    [name] UNABLE TO FIND Take 1 tablet by mouth daily. Occusight (Eye Med)    . vitamin B-12 (CYANOCOBALAMIN) 100 MCG tablet Take 100 mcg by mouth daily.     No current facility-administered medications on file prior to visit.    Review of Systems:  As per HPI- otherwise negative.   Physical Examination: Filed Vitals:   02/24/16 1656  BP: 161/64  Pulse: 95  Temp: 98.2 F (36.8 C)   Filed Vitals:   02/24/16 1656  Height: 5\' 1"  (1.549 m)  Weight: 207 lb (93.895 kg)   Body mass index is 39.13 kg/(m^2). Ideal Body Weight: Weight in (lb) to have BMI = 25: 132  GEN: WDWN, NAD, Non-toxic, A & O x 3, obese, looks well HEENT: Atraumatic, Normocephalic. Neck supple. No masses, No LAD. Ears and Nose: No external deformity. CV: RRR- no current evidence of fib, No M/G/R. No JVD. No thrill. No extra heart sounds. PULM: CTA B, no wheezes, crackles, rhonchi. No retractions. No resp. distress. No accessory muscle use. EXTR: No c/c/e. Severe OA in the PIP and DIP joints of her hands  NEURO Normal gait.  PSYCH: Normally interactive. Conversant. Not depressed or anxious appearing.  Calm demeanor.  140/85  Assessment and Plan: Essential hypertension - Plan: Ambulatory referral to Nutrition and Diabetic Education  Paroxysmal atrial fibrillation ([place])  Primary osteoarthritis of both hands  Her BP is back under control- she will continue to monitor and I did refer her to nutrition education She will see cardiology and also GI next month She has a rheum appt coming up to discuss her hands Asked her to let me  know if any other concerns and we will visit again in 3-4 months   Signed Lamar Blinks, MD

## 2016-02-25 DIAGNOSIS — L989 Disorder of the skin and subcutaneous tissue, unspecified: Secondary | ICD-10-CM | POA: Diagnosis not present

## 2016-02-25 DIAGNOSIS — H34831 Tributary (branch) retinal vein occlusion, right eye, with macular edema: Secondary | ICD-10-CM | POA: Diagnosis not present

## 2016-02-25 DIAGNOSIS — H43813 Vitreous degeneration, bilateral: Secondary | ICD-10-CM | POA: Diagnosis not present

## 2016-02-25 DIAGNOSIS — H35422 Microcystoid degeneration of retina, left eye: Secondary | ICD-10-CM | POA: Diagnosis not present

## 2016-02-25 DIAGNOSIS — Z6838 Body mass index (BMI) 38.0-38.9, adult: Secondary | ICD-10-CM | POA: Diagnosis not present

## 2016-02-25 DIAGNOSIS — R928 Other abnormal and inconclusive findings on diagnostic imaging of breast: Secondary | ICD-10-CM | POA: Diagnosis not present

## 2016-03-13 ENCOUNTER — Emergency Department (HOSPITAL_BASED_OUTPATIENT_CLINIC_OR_DEPARTMENT_OTHER)
Admission: EM | Admit: 2016-03-13 | Discharge: 2016-03-14 | Disposition: A | Discharge status: Home / Self Care | Payer: Medicare Other | Attending: Emergency Medicine | Admitting: Emergency Medicine

## 2016-03-13 ENCOUNTER — Encounter: Payer: Self-pay | Admitting: *Deleted

## 2016-03-13 DIAGNOSIS — I10 Essential (primary) hypertension: Secondary | ICD-10-CM | POA: Diagnosis not present

## 2016-03-13 DIAGNOSIS — F329 Major depressive disorder, single episode, unspecified: Secondary | ICD-10-CM | POA: Diagnosis not present

## 2016-03-13 DIAGNOSIS — E039 Hypothyroidism, unspecified: Secondary | ICD-10-CM | POA: Insufficient documentation

## 2016-03-13 DIAGNOSIS — I499 Cardiac arrhythmia, unspecified: Secondary | ICD-10-CM | POA: Diagnosis present

## 2016-03-13 DIAGNOSIS — I480 Paroxysmal atrial fibrillation: Secondary | ICD-10-CM | POA: Diagnosis not present

## 2016-03-13 DIAGNOSIS — E785 Hyperlipidemia, unspecified: Secondary | ICD-10-CM | POA: Insufficient documentation

## 2016-03-13 DIAGNOSIS — Z79899 Other long term (current) drug therapy: Secondary | ICD-10-CM | POA: Insufficient documentation

## 2016-03-13 DIAGNOSIS — R0602 Shortness of breath: Secondary | ICD-10-CM | POA: Insufficient documentation

## 2016-03-13 NOTE — ED Notes (Signed)
Irregular heart beat for hours. States she has a hx of atrial fib off and on for a couple of months. She was started on a blood thinner and her BP medication was doubled a couple of months ago.

## 2016-03-13 NOTE — ED Provider Notes (Signed)
By signing my name below, I, Dyke Brackett, attest that this documentation has been prepared under the direction and in the presence of [place], DO . Electronically Signed: Dyke Brackett, Scribe. 03/14/2016. 12:09 AM.  TIME SEEN: 11:48 PM  CHIEF COMPLAINT:  Chief Complaint  Patient presents with  . Irregular Heart Beat   HPI: HPI Comments:  Lisa Acosta is a 65 y.o. female with PMHx of HTN, Afib, and hyperlipidemia who presents to the Emergency Department complaining of sudden onset, gradually improving irregular heartbeat onset eight hours ago. She states she has been experiencing instances of Afib more frequently in the last two to three weeks. Pt reports she felt herself going into Afib earlier today Around 4 PM and experienced discomfort. She states her pulse was 95 today and her diastolic BP was in the A999333 and systolic BP in the A999333. Pt reports that she feels that her Afib has improved from earlier, but still feels fluttering. Pt also complains of associated diaphoresis and chills. Feels like she has a hard time catching her breath but denies feeling short of breath. Per pt, she has not missed dosages of atenolol and states she took atenolol tonight. Patient at first admits compliance to Xarelto but then thinks that she may have missed several doses this month. Pt currently sees Dr. Stanford Breed for heart and vascular care. She states was diagnosed with Afib two months ago and had dosage of atenolol adjusted in June 2017. She takes 25 mg twice a day. She denies any nausea, chest pain, shortness of breath, fever, cough, vomiting or diarrhea. States she has no cervical or several weeks that she will have these episodes where she feels her heart fluttering and then it will resolve on its own. She denies any illicit drug use, recent stimulant use.  CHADS Vasc 3  It appears per Dr. Stanford Breed notes April 2017 that if patient continued to be symptomatic they may consider an antiarrhythmic  such as flecainide.  PCP - Janett Billow [place] Cardiologist - [place]  ROS: See HPI Constitutional: no fever  Eyes: no drainage  ENT: no runny nose   Cardiovascular:  no chest pain  Resp: no SOB  GI: no vomiting GU: no dysuria Integumentary: no rash  Allergy: no hives  Musculoskeletal: no leg swelling  Neurological: no slurred speech ROS otherwise negative  PAST MEDICAL HISTORY/PAST SURGICAL HISTORY:  Past Medical History  Diagnosis Date  . Hyperlipidemia   . Hypertension   . Hypothyroid   . GERD (gastroesophageal reflux disease)   . Depression   . Diverticulosis   . Atrial fibrillation (HCC)     MEDICATIONS:  Prior to Admission medications   Medication Sig Start Date End Date Taking? Authorizing Provider  atenolol (TENORMIN) 25 MG tablet Take 1 tablet (25 mg total) by mouth 2 (two) times daily. 01/05/16   Lelon Perla, MD  beta carotene w/minerals (OCUVITE) tablet Take 1 tablet by mouth daily.    Historical Provider, MD  FLUoxetine (PROZAC) 40 MG capsule Take 40 mg by mouth 2 (two) times daily.     Historical Provider, MD  hydrochlorothiazide (HYDRODIURIL) 12.5 MG tablet Take 1 tablet (12.5 mg total) by mouth daily. 02/11/16   Noemi Chapel, MD  Krill Oil 1000 MG CAPS Take 1 capsule by mouth daily.    Historical Provider, MD  lactobacillus acidophilus (BACID) TABS tablet Take 1 tablet by mouth daily.    Historical Provider, MD  levothyroxine (SYNTHROID, LEVOTHROID) 125 MCG tablet Take 125 mcg by mouth  daily before breakfast.    Historical Provider, MD  lovastatin (ALTOPREV) 40 MG 24 hr tablet Take 40 mg by mouth at bedtime.    Historical Provider, MD  magnesium 30 MG tablet Take 30 mg by mouth daily.    Historical Provider, MD  pantoprazole (PROTONIX) 40 MG tablet Take 40 mg by mouth daily.    Historical Provider, MD  Potassium 75 MG TABS Take 1 tablet by mouth daily.    Historical Provider, MD  rivaroxaban (XARELTO) 20 MG TABS tablet Take 1 tablet (20 mg total) by mouth  daily with supper. 01/27/16   Lelon Perla, MD  traZODone (DESYREL) 50 MG tablet Take 50 mg by mouth at bedtime.    Historical Provider, MD  Turmeric 450 MG CAPS Take 1 capsule by mouth daily.    Historical Provider, MD  UNABLE TO FIND Take 1 tablet by mouth daily. Occusight (Eye Med)    Historical Provider, MD  vitamin B-12 (CYANOCOBALAMIN) 100 MCG tablet Take 100 mcg by mouth daily.    Historical Provider, MD    ALLERGIES:  Allergies  Allergen Reactions  . Dextran Anaphylaxis  . Dextrans   . Dextromethorphan Hbr Other (See Comments)    hallucinations  . Sulfa Antibiotics   . Tree Extract   . Penicillins Rash  . Sulfacetamide Sodium Rash    SOCIAL HISTORY:  Social History  Substance Use Topics  . Smoking status: Never Smoker   . Smokeless tobacco: Never Used  . Alcohol Use: Yes     Comment: rare    FAMILY HISTORY: Family History  Problem Relation Age of Onset  . Atrial fibrillation Mother   . Congestive Heart Failure Mother   . Heart disease Father   . Heart disease Brother   . Heart disease Brother     EXAM: BP 155/108 mmHg  Pulse 99  Temp(Src) 99.1 F (37.3 C) (Oral)  Resp 20  SpO2 99% CONSTITUTIONAL: Alert and oriented and responds appropriately to questions. Well-appearing; well-nourished, Afebrile, no significant distress, appears anxious HEAD: Normocephalic EYES: Conjunctivae clear, PERRL ENT: normal nose; no rhinorrhea; moist mucous membranes NECK: Supple, no meningismus, no LAD  CARD: Irregular rate and rhythm; heart rate intermittently in low 100's; S1 and S2 appreciated; no murmurs, no clicks, no rubs, no gallops RESP: Normal chest excursion without splinting or tachypnea; breath sounds clear and equal bilaterally; no wheezes, no rhonchi, no rales, no hypoxia or respiratory distress, speaking full sentences ABD/GI: Normal bowel sounds; non-distended; soft, non-tender, no rebound, no guarding, no peritoneal signs BACK:  The back appears normal and is  non-tender to palpation, there is no CVA tenderness EXT: Normal ROM in all joints; non-tender to palpation; no edema; normal capillary refill; no cyanosis, no calf tenderness or swelling    SKIN: Normal color for age and race; warm; no rash NEURO: Moves all extremities equally, sensation to light touch intact diffusely, cranial nerves II through XII intact, ambulates with normal gait PSYCH: The patient's mood and manner are appropriate. Grooming and personal hygiene are appropriate.  MEDICAL DECISION MAKING: Patient here with atrial fibrillation. She is hemodynamically stable. Heart rate is in the 80s to low 100s. She states that her heart rate never got above 110. I do not feel patient is a candidate for cardioversion given she is hemodynamically stable and thinks that she may have recently missed doses of her Xarelto has only been taking this medication for the past 2 months. Discussed with her that her risk of stroke outweighs  any benefit at this time and she agrees. I do not think she needs diltiazem drip given her heart rate is controlled. She does feel symptomatic with this. Because of this I will check cardiac labs. Will give IV fluids and continue to monitor patient. Anticipate if she is still symptomatic and stays in atrial fibrillation that I will discuss with cardiology on call for further recommendations.  ED PROGRESS: 1:25 AM  Pt reports feeling somewhat better after IV hydration. Heart rate is still in the 80s in atrial fibrillation. Labs have been unremarkable. Hemoglobin 14.6. Potassium 3.7, magnesium 2.0. Troponin is negative. BNP only mildly elevated at 131.5. Chest x-ray shows no infiltrate or significant edema. Urine does show moderate leukocytes and rare bacteria but no other sign of infection. She denies dysuria, hematuria, urinary frequency or urgency, suprapubic pain. We'll send urine culture and hold on treatment at this time. Patient is comfortable with this plan. Discuss with  cardiology on call given she is still symptomatic for further recommendations. Anticipate close outpatient follow-up.    1:35 AM  D/w Dr. Lamona Curl on call for cardiology. He agrees patient is not a candidate for electrocardioversion at this time given possibility of missing doses of her anticoagulation. I have discussed with patient and daughter that she will need to be compliant with this medication or she would have to have a TEE. Discussed with him that there would be risk of stroke with electrical cardioversion at this time and then understand. Given she is rate controlled, hemodynamically stable with normal workup, cardiology feels patient can be discharged and I agree. We have recommended close outpatient follow-up with Dr. Stanford Breed. Dr. Lamona Curl will send a note to Dr. Stanford Breed as well. Discussed with patient and her daughter these recommendations and discussed return precautions. They verbalize understanding and are comfortable with this plan.   At this time, I do not feel there is any life-threatening condition present. I have reviewed and discussed all results (EKG, imaging, lab, urine as appropriate), exam findings with patient. I have reviewed nursing notes and appropriate previous records.  I feel the patient is safe to be discharged home without further emergent workup. Discussed usual and customary return precautions. Patient and family (if present) verbalize understanding and are comfortable with this plan.  Patient will follow-up with their primary care provider. If they do not have a primary care provider, information for follow-up has been provided to them. All questions have been answered.    EKG Interpretation  Date/Time:  Monday March 13 2016 22:19:41 EDT Ventricular Rate:  108 PR Interval:    QRS Duration: 86 QT Interval:  300 QTC Calculation: 402 R Axis:   32 Text Interpretation:  Atrial fibrillation with rapid ventricular response Cannot rule out Anterior infarct , age  undetermined Abnormal ECG Last EKG shows NSR but patient is now in atrial fibrillation compared to prior Confirmed by Mistey Hoffert,  DO, Anatalia Kronk [phone]) on 03/13/2016 10:58:23 PM       I personally performed the services described in this documentation, which was scribed in my presence. The recorded information has been reviewed and is accurate.   [place], DO 03/14/16 [phone]

## 2016-03-14 ENCOUNTER — Emergency Department (HOSPITAL_BASED_OUTPATIENT_CLINIC_OR_DEPARTMENT_OTHER): Payer: Medicare Other

## 2016-03-14 DIAGNOSIS — I480 Paroxysmal atrial fibrillation: Secondary | ICD-10-CM | POA: Diagnosis not present

## 2016-03-14 DIAGNOSIS — R0602 Shortness of breath: Secondary | ICD-10-CM | POA: Diagnosis not present

## 2016-03-14 LAB — BRAIN NATRIURETIC PEPTIDE: B Natriuretic Peptide: 131.5 pg/mL — ABNORMAL HIGH (ref 0.0–100.0)

## 2016-03-14 LAB — CBC WITH DIFFERENTIAL/PLATELET
Basophils Absolute: 0.1 10*3/uL (ref 0.0–0.1)
Basophils Relative: 1 %
Eosinophils Absolute: 0.3 10*3/uL (ref 0.0–0.7)
Eosinophils Relative: 3 %
HCT: 42.1 % (ref 36.0–46.0)
Hemoglobin: 14.6 g/dL (ref 12.0–15.0)
Lymphocytes Relative: 36 %
Lymphs Abs: 2.9 10*3/uL (ref 0.7–4.0)
MCH: 29.7 pg (ref 26.0–34.0)
MCHC: 34.7 g/dL (ref 30.0–36.0)
MCV: 85.6 fL (ref 78.0–100.0)
Monocytes Absolute: 0.8 10*3/uL (ref 0.1–1.0)
Monocytes Relative: 10 %
Neutro Abs: 4.1 10*3/uL (ref 1.7–7.7)
Neutrophils Relative %: 50 %
Platelets: 252 10*3/uL (ref 150–400)
RBC: 4.92 MIL/uL (ref 3.87–5.11)
RDW: 12.9 % (ref 11.5–15.5)
WBC: 8 10*3/uL (ref 4.0–10.5)

## 2016-03-14 LAB — BASIC METABOLIC PANEL
Anion gap: 11 (ref 5–15)
BUN: 14 mg/dL (ref 6–20)
CO2: 23 mmol/L (ref 22–32)
Calcium: 9.5 mg/dL (ref 8.9–10.3)
Chloride: 104 mmol/L (ref 101–111)
Creatinine, Ser: 0.6 mg/dL (ref 0.44–1.00)
GFR calc Af Amer: >60 mL/min (ref >60)
GFR calc non Af Amer: >60 mL/min (ref >60)
Glucose, Bld: 117 mg/dL — ABNORMAL HIGH (ref 65–99)
Potassium: 3.7 mmol/L (ref 3.5–5.1)
Sodium: 138 mmol/L (ref 135–145)

## 2016-03-14 LAB — URINALYSIS, ROUTINE W REFLEX MICROSCOPIC
Bilirubin Urine: NEGATIVE
Glucose, UA: NEGATIVE mg/dL
Hgb urine dipstick: NEGATIVE
Ketones, ur: NEGATIVE mg/dL
Nitrite: NEGATIVE
Protein, ur: NEGATIVE mg/dL
Specific Gravity, Urine: 1.01 (ref 1.005–1.030)
pH: 7.5 (ref 5.0–8.0)

## 2016-03-14 LAB — URINE MICROSCOPIC-ADD ON: RBC / HPF: NONE SEEN RBC/hpf (ref 0–5)

## 2016-03-14 LAB — TSH: TSH: 1.179 u[IU]/mL (ref 0.350–4.500)

## 2016-03-14 LAB — MAGNESIUM: Magnesium: 2 mg/dL (ref 1.7–2.4)

## 2016-03-14 LAB — TROPONIN I: Troponin I: <0.03 ng/mL (ref <0.03)

## 2016-03-14 MED ORDER — SODIUM CHLORIDE 0.9 % IV BOLUS (SEPSIS)
1000.0000 mL | Freq: Once | INTRAVENOUS | Status: AC
  Administered 2016-03-14: 1000 mL via INTRAVENOUS

## 2016-03-14 NOTE — Discharge Instructions (Signed)

## 2016-03-15 LAB — URINE CULTURE

## 2016-03-16 ENCOUNTER — Ambulatory Visit: Payer: Self-pay | Admitting: Cardiology

## 2016-03-16 ENCOUNTER — Encounter: Payer: Self-pay | Admitting: Cardiology

## 2016-03-16 ENCOUNTER — Ambulatory Visit (INDEPENDENT_AMBULATORY_CARE_PROVIDER_SITE_OTHER): Payer: Medicare Other | Admitting: Cardiology

## 2016-03-16 ENCOUNTER — Telehealth: Payer: Self-pay | Admitting: Cardiology

## 2016-03-16 VITALS — BP 116/74 | HR 61 | Ht 61.0 in | Wt 205.0 lb

## 2016-03-16 DIAGNOSIS — G4730 Sleep apnea, unspecified: Secondary | ICD-10-CM

## 2016-03-16 DIAGNOSIS — I10 Essential (primary) hypertension: Secondary | ICD-10-CM

## 2016-03-16 DIAGNOSIS — I480 Paroxysmal atrial fibrillation: Secondary | ICD-10-CM | POA: Diagnosis not present

## 2016-03-16 DIAGNOSIS — E669 Obesity, unspecified: Secondary | ICD-10-CM

## 2016-03-16 DIAGNOSIS — E038 Other specified hypothyroidism: Secondary | ICD-10-CM

## 2016-03-16 DIAGNOSIS — E785 Hyperlipidemia, unspecified: Secondary | ICD-10-CM

## 2016-03-16 DIAGNOSIS — E039 Hypothyroidism, unspecified: Secondary | ICD-10-CM | POA: Insufficient documentation

## 2016-03-16 NOTE — Assessment & Plan Note (Signed)
Recurrent symptomatic PAF

## 2016-03-16 NOTE — Telephone Encounter (Signed)
Faxed Release signed by patient to [facility] to obtain records per [facility] [place].  Release faxed on 03/16/16. lp

## 2016-03-16 NOTE — Assessment & Plan Note (Signed)
TSH WNL 03/14/16

## 2016-03-16 NOTE — Assessment & Plan Note (Signed)
C-pap compliant 

## 2016-03-16 NOTE — Progress Notes (Signed)
03/16/2016 Lisa Acosta   24-Feb-1951  [id number]  Primary Physician Lamar Blinks, MD Primary Cardiologist: Dr Stanford Breed  HPI:  65 y/o obese Caucasian female with a history of PAF seen in the office today after she had recurrent PAF with symptoms 03/14/16- seen in ED at [facility]. The pt has no history of MI or CAD. She says she had a negative stress test in [facility] last year when she was admitted with chest pain (turned out to be GI). She is on Xarelto but admitted to the ED MD she may occasionally miss a dose. In the office today she is in NSR.    Current Outpatient Prescriptions  Medication Sig Dispense Refill  . atenolol (TENORMIN) 25 MG tablet Take 1 tablet (25 mg total) by mouth 2 (two) times daily. 180 tablet 3  . beta carotene w/minerals (OCUVITE) tablet Take 1 tablet by mouth daily.    [name] FLUoxetine (PROZAC) 40 MG capsule Take 40 mg by mouth 2 (two) times daily.     . hydrochlorothiazide (HYDRODIURIL) 12.5 MG tablet Take 1 tablet (12.5 mg total) by mouth daily. (Patient taking differently: Take 12.5 mg by mouth as needed. ) 30 tablet 1  . Krill Oil 1000 MG CAPS Take 1 capsule by mouth daily.    [name] lactobacillus acidophilus (BACID) TABS tablet Take 1 tablet by mouth daily.    [name] levothyroxine (SYNTHROID, LEVOTHROID) 125 MCG tablet Take 125 mcg by mouth daily before breakfast.    . lovastatin (ALTOPREV) 40 MG 24 hr tablet Take 40 mg by mouth at bedtime.    . magnesium 30 MG tablet Take 30 mg by mouth daily.    . pantoprazole (PROTONIX) 40 MG tablet Take 40 mg by mouth daily.    . Potassium 75 MG TABS Take 1 tablet by mouth daily.    . rivaroxaban (XARELTO) 20 MG TABS tablet Take 1 tablet (20 mg total) by mouth daily with supper. 90 tablet 3  . traZODone (DESYREL) 50 MG tablet Take 50 mg by mouth at bedtime.    . Turmeric 450 MG CAPS Take 1 capsule by mouth daily.    [name] UNABLE TO FIND Take 1 tablet by mouth daily. Occusight (Eye Med)     No current facility-administered medications  for this visit.    Allergies  Allergen Reactions  . Dextran Anaphylaxis  . Dextrans   . Dextromethorphan Hbr Other (See Comments)    hallucinations  . Sulfa Antibiotics   . Tree Extract   . Penicillins Rash  . Sulfacetamide Sodium Rash    Social History   Social History  . Marital Status: Married    Spouse Name: Jenny Reichmann  . Number of Children: 1  . Years of Education: College   Occupational History  .  Other    [place] History Main Topics  . Smoking status: Never Smoker   . Smokeless tobacco: Never Used  . Alcohol Use: Yes     Comment: rare  . Drug Use: No  . Sexual Activity: Not on file   Other Topics Concern  . Not on file   Social History Narrative   Patient lives at home spouse.   Caffeine use: 2 sodas weelky     Review of Systems: General: negative for chills, fever, night sweats or weight changes.  Cardiovascular: negative for chest pain, dyspnea on exertion, edema, orthopnea, palpitations, paroxysmal nocturnal dyspnea or shortness of breath Dermatological: negative for rash Respiratory: negative for cough or  wheezing Urologic: negative for hematuria Abdominal: negative for nausea, vomiting, diarrhea, bright red blood per rectum, melena, or hematemesis Neurologic: negative for visual changes, syncope, or dizziness All other systems reviewed and are otherwise negative except as noted above.    Blood pressure 116/74, pulse 61, height 5\' 1"  (1.549 m), weight 205 lb (92.987 kg).  General appearance: alert, cooperative, no distress and moderately obese Lungs: clear to auscultation bilaterally Heart: regular rate and rhythm Extremities: no edema Skin: Skin color, texture, turgor normal. No rashes or lesions Neurologic: Grossly normal  EKG NSR, QTc417  ASSESSMENT AND PLAN:   PAF (paroxysmal atrial fibrillation) (HCC) Recurrent symptomatic PAF  Sleep apnea C-pap compliant  Essential hypertension Controlled  Hypothyroid TSH  WNL 03/14/16  Obesity (BMI 30-39.9) BMI 38   PLAN  Check [place] for stress test results- if negative start Flecainide 50 mg BID. I discussed the importance of compliance with Xarelto.   Kerin Ransom PA-C 03/16/2016 9:02 AM

## 2016-03-16 NOTE — Patient Instructions (Addendum)
Medication Instructions:  Continue current medications  Labwork: NONE  Testing/Procedures: NONE  Follow-Up: Your physician recommends that you schedule a follow-up appointment in: keep appt with Dr Stanford Breed on 07/19 @ 12:30   Any Other Special Instructions Will Be Listed Below (If Applicable).   If you need a refill on your cardiac medications before your next appointment, please call your pharmacy.

## 2016-03-16 NOTE — Assessment & Plan Note (Signed)
Controlled.  

## 2016-03-16 NOTE — Assessment & Plan Note (Signed)
BMI 38 

## 2016-03-17 ENCOUNTER — Encounter: Payer: Self-pay | Admitting: Cardiology

## 2016-03-24 NOTE — Progress Notes (Signed)
HPI: FU atrial fibrillation. Laboratories February 2017 showed potassium 3.5 and normal hemoglobin. Monitor March 2017 showed sinus rhythm with paroxysmal atrial fibrillation. Echocardiogram March 2017 showed normal LV systolic function, Grade 1 diastolic dysfunction, trace mitral and tricuspid regurgitation. TSH March 2017 normal at 0.94. Seen recently for increased episodes of atrial fibrillation and flecainide was to be initiated if previous stress test normal. Since last seen, She has mild dyspnea on exertion but no orthopnea, PND, pedal edema or chest pain. Occasional brief palpitations.  Current Outpatient Prescriptions  Medication Sig Dispense Refill  . atenolol (TENORMIN) 25 MG tablet Take 1 tablet (25 mg total) by mouth 2 (two) times daily. 180 tablet 3  . beta carotene w/minerals (OCUVITE) tablet Take 1 tablet by mouth daily.    [name] FLUoxetine (PROZAC) 40 MG capsule Take 40 mg by mouth 2 (two) times daily.     . hydrochlorothiazide (HYDRODIURIL) 12.5 MG tablet Take 1 tablet (12.5 mg total) by mouth daily. (Patient taking differently: Take 12.5 mg by mouth as needed. ) 30 tablet 1  . Krill Oil 1000 MG CAPS Take 1 capsule by mouth daily.    [name] lactobacillus acidophilus (BACID) TABS tablet Take 1 tablet by mouth daily.    [name] levothyroxine (SYNTHROID, LEVOTHROID) 125 MCG tablet Take 125 mcg by mouth daily before breakfast.    . lovastatin (ALTOPREV) 40 MG 24 hr tablet Take 40 mg by mouth at bedtime.    . magnesium 30 MG tablet Take 30 mg by mouth daily.    . pantoprazole (PROTONIX) 40 MG tablet Take 40 mg by mouth daily.    . Potassium 75 MG TABS Take 1 tablet by mouth daily.    . rivaroxaban (XARELTO) 20 MG TABS tablet Take 1 tablet (20 mg total) by mouth daily with supper. 90 tablet 3  . traZODone (DESYREL) 50 MG tablet Take 50 mg by mouth at bedtime.    . Turmeric 450 MG CAPS Take 1 capsule by mouth daily.    [name] UNABLE TO FIND Take 1 tablet by mouth daily. Occusight (Eye Med)     No  current facility-administered medications for this visit.     Past Medical History  Diagnosis Date  . Hyperlipidemia   . Hypertension   . Hypothyroid   . GERD (gastroesophageal reflux disease)   . Depression   . Diverticulosis   . Atrial fibrillation [facility]     Past Surgical History  Procedure Laterality Date  . Total abdominal hysterectomy    . Rotator cuff repair    . Hand surgery    . Knee surgery    . Cataract extraction      Social History   Social History  . Marital Status: Married    Spouse Name: Jenny Reichmann  . Number of Children: 1  . Years of Education: College   Occupational History  .  Other    [place] History Main Topics  . Smoking status: Never Smoker   . Smokeless tobacco: Never Used  . Alcohol Use: Yes     Comment: rare  . Drug Use: No  . Sexual Activity: Not on file   Other Topics Concern  . Not on file   Social History Narrative   Patient lives at home spouse.   Caffeine use: 2 sodas weelky    Family History  Problem Relation Age of Onset  . Atrial fibrillation Mother   . Congestive Heart Failure Mother   .  Heart disease Father   . Heart disease Brother   . Heart disease Brother     ROS: no fevers or chills, productive cough, hemoptysis, dysphasia, odynophagia, melena, hematochezia, dysuria, hematuria, rash, seizure activity, orthopnea, PND, pedal edema, claudication. Remaining systems are negative.  Physical Exam: Well-developed obese in no acute distress.  Skin is warm and dry.  HEENT is normal.  Neck is supple.  Chest is clear to auscultation with normal expansion.  Cardiovascular exam is regular rate and rhythm.  Abdominal exam nontender or distended. No masses palpated. Extremities show no edema. neuro grossly intact  ECG Sinus bradycardia at a rate of 52.  A/P  1 Paroxysmal atrial fibrillation-patient is in sinus rhythm today. Continue atenolol for rate control with atrial fibrillation recurs.  Continue xarelto. We discussed different antiarrhythmic therapy today. I offered flecainide to decrease the frequency of her events as she is symptomatic. She is somewhat hesitant and would prefer to continue with present therapy. I will see her back in 8 weeks and we will reassess at that point. She will consider flecainide if symptoms worsen.  2 hypertension-blood pressure is elevated. Add amlodipine 5 mg daily and follow.  3 hyperlipidemia-management per primary care.  Kirk Ruths, MD

## 2016-03-29 ENCOUNTER — Ambulatory Visit (INDEPENDENT_AMBULATORY_CARE_PROVIDER_SITE_OTHER): Payer: Medicare Other | Admitting: Cardiology

## 2016-03-29 ENCOUNTER — Encounter: Payer: Self-pay | Admitting: Cardiology

## 2016-03-29 VITALS — BP 151/81 | HR 52 | Ht 61.0 in | Wt 204.0 lb

## 2016-03-29 DIAGNOSIS — I10 Essential (primary) hypertension: Secondary | ICD-10-CM

## 2016-03-29 DIAGNOSIS — I480 Paroxysmal atrial fibrillation: Secondary | ICD-10-CM

## 2016-03-29 DIAGNOSIS — E785 Hyperlipidemia, unspecified: Secondary | ICD-10-CM

## 2016-03-29 MED ORDER — AMLODIPINE BESYLATE 5 MG PO TABS: 5.0000 mg | ORAL_TABLET | Freq: Every day | ORAL | Status: DC

## 2016-03-29 NOTE — Patient Instructions (Signed)
Medication Instructions:   START AMLODIPINE 5 MG ONCE DAILY  Follow-Up:  Your physician recommends that you schedule a follow-up appointment in: [place]

## 2016-04-04 ENCOUNTER — Ambulatory Visit: Payer: Medicare Other | Admitting: Internal Medicine

## 2016-04-04 ENCOUNTER — Ambulatory Visit (INDEPENDENT_AMBULATORY_CARE_PROVIDER_SITE_OTHER): Payer: Medicare Other | Admitting: Family

## 2016-04-04 ENCOUNTER — Encounter: Payer: Self-pay | Admitting: Family

## 2016-04-04 VITALS — BP 110/78 | Temp 98.9°F | Ht 61.0 in

## 2016-04-04 DIAGNOSIS — B029 Zoster without complications: Secondary | ICD-10-CM

## 2016-04-04 DIAGNOSIS — H109 Unspecified conjunctivitis: Secondary | ICD-10-CM | POA: Diagnosis not present

## 2016-04-04 MED ORDER — POLYMYXIN B-TRIMETHOPRIM 10000-0.1 UNIT/ML-% OP SOLN: 2.0000 [drp] | Freq: Four times a day (QID) | OPHTHALMIC | Status: AC

## 2016-04-04 MED ORDER — VALACYCLOVIR HCL 1 G PO TABS: 1000.0000 mg | ORAL_TABLET | Freq: Three times a day (TID) | ORAL | 0 refills | Status: DC

## 2016-04-04 NOTE — Patient Instructions (Signed)
Please begin trimethoprim eye drops for conjunctivitis. Begin Valtrex for shingles. Please call if symptoms worsen, or if symptoms/pain are not improved in 1 week.  Talk with Dr. Edilia Bo when you are feeling better about arranging a shingles vaccine to help prevent future outbreaks.    Shingles Shingles, which is also known as herpes zoster, is an infection that causes a painful skin rash and fluid-filled blisters. Shingles is not related to genital herpes, which is a sexually transmitted infection.   Shingles only develops in people who:  Have had chickenpox.  Have received the chickenpox vaccine. (This is rare.) CAUSES Shingles is caused by varicella-zoster virus (VZV). This is the same virus that causes chickenpox. After exposure to VZV, the virus stays in the body in an inactive (dormant) state. Shingles develops if the virus reactivates. This can happen many years after the initial exposure to VZV. It is not known what causes this virus to reactivate. RISK FACTORS People who have had chickenpox or received the chickenpox vaccine are at risk for shingles. Infection is more common in people who:  Are older than age 74.  Have a weakened defense (immune) system, such as those with HIV, AIDS, or cancer.  Are taking medicines that weaken the immune system, such as transplant medicines.  Are under great stress. SYMPTOMS Early symptoms of this condition include itching, tingling, and pain in an area on your skin. Pain may be described as burning, stabbing, or throbbing. A few days or weeks after symptoms start, a painful red rash appears, usually on one side of the body in a bandlike or beltlike pattern. The rash eventually turns into fluid-filled blisters that break open, scab over, and dry up in about 2-3 weeks. At any time during the infection, you may also develop:  A fever.  Chills.  A headache.  An upset stomach. DIAGNOSIS This condition is diagnosed with a skin exam.  Sometimes, skin or fluid samples are taken from the blisters before a diagnosis is made. These samples are examined under a microscope or sent to a lab for testing. TREATMENT There is no specific cure for this condition. Your health care provider will probably prescribe medicines to help you manage pain, recover more quickly, and avoid long-term problems. Medicines may include:  Antiviral drugs.  Anti-inflammatory drugs.  Pain medicines. If the area involved is on your face, you may be referred to a specialist, such as an eye doctor (ophthalmologist) or an ear, nose, and throat (ENT) doctor to help you avoid eye problems, chronic pain, or disability. HOME CARE INSTRUCTIONS Medicines  Take medicines only as directed by your health care provider.  Apply an anti-itch or numbing cream to the affected area as directed by your health care provider. Blister and Rash Care  Take a cool bath or apply cool compresses to the area of the rash or blisters as directed by your health care provider. This may help with pain and itching.  Keep your rash covered with a loose bandage (dressing). Wear loose-fitting clothing to help ease the pain of material rubbing against the rash.  Keep your rash and blisters clean with mild soap and cool water or as directed by your health care provider.  Check your rash every day for signs of infection. These include redness, swelling, and pain that lasts or increases.  Do not pick your blisters.  Do not scratch your rash. General Instructions  Rest as directed by your health care provider.  Keep all follow-up visits as directed by  your health care provider. This is important.  Until your blisters scab over, your infection can cause chickenpox in people who have never had it or been vaccinated against it. To prevent this from happening, avoid contact with other people, especially:  Babies.  Pregnant women.  Children who have eczema.  Elderly people who have  transplants.  People who have chronic illnesses, such as leukemia or AIDS. SEEK MEDICAL CARE IF:  Your pain is not relieved with prescribed medicines.  Your pain does not get better after the rash heals.  Your rash looks infected. Signs of infection include redness, swelling, and pain that lasts or increases. SEEK IMMEDIATE MEDICAL CARE IF:  The rash is on your face or nose.  You have facial pain, pain around your eye area, or loss of feeling on one side of your face.  You have ear pain or you have ringing in your ear.  You have loss of taste.  Your condition gets worse.   This information is not intended to replace advice given to you by your health care provider. Make sure you discuss any questions you have with your health care provider.   Document Released: 08/28/2005 Document Revised: 09/18/2014 Document Reviewed: 07/09/2014 Elsevier Interactive Patient Education [employer].

## 2016-04-04 NOTE — Progress Notes (Signed)
Pre visit review using our clinic review tool, if applicable. No additional management support is needed unless otherwise documented below in the visit note. 

## 2016-04-04 NOTE — Progress Notes (Signed)
Subjective:    Patient ID: Lisa Acosta, female    DOB: 1951/07/07, 65 y.o.   MRN: [id number]  HPI  Lisa Acosta is a 65 yr old female who presents today with chief complaint of rash on her right lower back. Reports that rash has been present since yesterday.  Reports fatigue (slept much of the day on Saturday).    Left eye problem- reports itching/burning since yesterday.   Review of Systems    see HPI  Past Medical History:  Diagnosis Date  . Atrial fibrillation ([place])   . Depression   . Diverticulosis   . GERD (gastroesophageal reflux disease)   . Hyperlipidemia   . Hypertension   . Hypothyroid      Social History   Social History  . Marital status: Married    Spouse name: Jenny Reichmann  . Number of children: 1  . Years of education: College   Occupational History  .  Other    [place] History Main Topics  . Smoking status: Never Smoker  . Smokeless tobacco: Never Used  . Alcohol use Yes     Comment: rare  . Drug use: No  . Sexual activity: Not on file   Other Topics Concern  . Not on file   Social History Narrative   Patient lives at home spouse.   Caffeine use: 2 sodas weelky    Past Surgical History:  Procedure Laterality Date  . CATARACT EXTRACTION    . HAND SURGERY    . KNEE SURGERY    . ROTATOR CUFF REPAIR    . TOTAL ABDOMINAL HYSTERECTOMY      Family History  Problem Relation Age of Onset  . Atrial fibrillation Mother   . Congestive Heart Failure Mother   . Heart disease Father   . Heart disease Brother   . Heart disease Brother     Allergies  Allergen Reactions  . Dextran Anaphylaxis  . Dextrans   . Dextromethorphan Hbr Other (See Comments)    hallucinations  . Sulfa Antibiotics   . Tree Extract   . Penicillins Rash  . Sulfacetamide Sodium Rash    Current Outpatient Prescriptions on File Prior to Visit  Medication Sig Dispense Refill  . amLODipine (NORVASC) 5 MG tablet Take 1 tablet (5 mg total) by mouth  daily. 90 tablet 3  . atenolol (TENORMIN) 25 MG tablet Take 1 tablet (25 mg total) by mouth 2 (two) times daily. 180 tablet 3  . FLUoxetine (PROZAC) 40 MG capsule Take 40 mg by mouth 2 (two) times daily.     . hydrochlorothiazide (HYDRODIURIL) 12.5 MG tablet Take 1 tablet (12.5 mg total) by mouth daily. (Patient taking differently: Take 12.5 mg by mouth as needed. ) 30 tablet 1  . Krill Oil 1000 MG CAPS Take 1 capsule by mouth daily.    [name] lactobacillus acidophilus (BACID) TABS tablet Take 1 tablet by mouth daily.    [name] levothyroxine (SYNTHROID, LEVOTHROID) 125 MCG tablet Take 125 mcg by mouth daily before breakfast.    . lovastatin (ALTOPREV) 40 MG 24 hr tablet Take 40 mg by mouth at bedtime.    . magnesium 30 MG tablet Take 30 mg by mouth daily.    . pantoprazole (PROTONIX) 40 MG tablet Take 40 mg by mouth daily.    . Potassium 75 MG TABS Take 1 tablet by mouth daily.    . rivaroxaban (XARELTO) 20 MG TABS tablet Take 1 tablet (20 mg total)  by mouth daily with supper. 90 tablet 3  . traZODone (DESYREL) 50 MG tablet Take 50 mg by mouth at bedtime.    . Turmeric 450 MG CAPS Take 1 capsule by mouth daily.    [name] UNABLE TO FIND Take 1 tablet by mouth daily. Occusight (Eye Med)     No current facility-administered medications on file prior to visit.     BP 110/78   Temp 98.9 F (37.2 C) (Oral)   Ht 5\' 1"  (1.549 m)    Objective:   Physical Exam  Constitutional: She is oriented to person, place, and time. She appears well-developed and well-nourished. No distress.  HENT:  Head: Normocephalic and atraumatic.  Eyes:  Mild injection left eye. No exudate noted  Neurological: She is alert and oriented to person, place, and time.  Skin:     Cluster of vesicles on erythematous base noted right flank.   Psychiatric: She has a normal mood and affect. Her behavior is normal. Judgment and thought content normal.          Assessment & Plan:  Herpes Zoster- advised pt as follows:   Begin  Valtrex for shingles. Please call if symptoms worsen, or if symptoms/pain are not improved in 1 week.  Talk with Dr. Lorelei Pont when you are feeling better about arranging a shingles vaccine to help prevent future outbreaks.  Conjunctivitis- left eye, mild.  Will rx with trimethoprim.   Debbrah Alar NP

## 2016-04-04 NOTE — Addendum Note (Signed)
Addended by: Cristopher Estimable on: 04/04/2016 04:24 PM   Modules accepted: Orders

## 2016-04-13 DIAGNOSIS — M79642 Pain in left hand: Secondary | ICD-10-CM | POA: Diagnosis not present

## 2016-04-13 DIAGNOSIS — M2550 Pain in unspecified joint: Secondary | ICD-10-CM | POA: Diagnosis not present

## 2016-04-13 DIAGNOSIS — Z8739 Personal history of other diseases of the musculoskeletal system and connective tissue: Secondary | ICD-10-CM | POA: Diagnosis not present

## 2016-04-13 DIAGNOSIS — M150 Primary generalized (osteo)arthritis: Secondary | ICD-10-CM | POA: Diagnosis not present

## 2016-04-13 DIAGNOSIS — M79641 Pain in right hand: Secondary | ICD-10-CM | POA: Diagnosis not present

## 2016-05-05 DIAGNOSIS — H34831 Tributary (branch) retinal vein occlusion, right eye, with macular edema: Secondary | ICD-10-CM | POA: Diagnosis not present

## 2016-05-05 DIAGNOSIS — H35422 Microcystoid degeneration of retina, left eye: Secondary | ICD-10-CM | POA: Diagnosis not present

## 2016-05-05 DIAGNOSIS — H43813 Vitreous degeneration, bilateral: Secondary | ICD-10-CM | POA: Diagnosis not present

## 2016-05-17 NOTE — Progress Notes (Signed)
HPI: FU atrial fibrillation. Monitor March 2017 showed sinus rhythm with paroxysmal atrial fibrillation. Echocardiogram March 2017 showed normal LV systolic function, Grade 1 diastolic dysfunction, trace mitral and tricuspid regurgitation. TSH March 2017 normal at 0.94. Patient with increased episodes of atrial fibrillation at last office visit and flecainide offered. Patient declined at that point. Since last seen, Patient has had 2 bouts of atrial fibrillation. When she has these episodes she has palpitations associated with dyspnea. She otherwise has some dyspnea on exertion but no orthopnea, PND, pedal edema, exertional chest pain, syncope or bleeding.  Current Outpatient Prescriptions  Medication Sig Dispense Refill  . amLODipine (NORVASC) 5 MG tablet Take 1 tablet (5 mg total) by mouth daily. 90 tablet 3  . atenolol (TENORMIN) 25 MG tablet Take 1 tablet (25 mg total) by mouth 2 (two) times daily. 180 tablet 3  . BIOTIN PO Take 1 tablet by mouth daily.    . Cholecalciferol (D 2000) 2000 units TABS Take 1 tablet by mouth daily.    [name] FLUoxetine (PROZAC) 40 MG capsule Take 40 mg by mouth 2 (two) times daily.     . hydrochlorothiazide (HYDRODIURIL) 12.5 MG tablet Take 1 tablet (12.5 mg total) by mouth daily. (Patient taking differently: Take 12.5 mg by mouth as needed. ) 30 tablet 1  . Krill Oil 1000 MG CAPS Take 1 capsule by mouth daily.    [name] lactobacillus acidophilus (BACID) TABS tablet Take 1 tablet by mouth daily.    [name] levothyroxine (SYNTHROID, LEVOTHROID) 125 MCG tablet Take 125 mcg by mouth daily before breakfast.    . lovastatin (ALTOPREV) 40 MG 24 hr tablet Take 40 mg by mouth at bedtime.    . magnesium 30 MG tablet Take 30 mg by mouth daily.    [name] OVER THE COUNTER MEDICATION OcuSight.  Take 1 capsule daily.    . pantoprazole (PROTONIX) 40 MG tablet Take 40 mg by mouth daily.    . Potassium 75 MG TABS Take 1 tablet by mouth daily.    . rivaroxaban (XARELTO) 20 MG TABS tablet Take  1 tablet (20 mg total) by mouth daily with supper. 90 tablet 3  . traZODone (DESYREL) 50 MG tablet Take 50 mg by mouth at bedtime.    . Turmeric 450 MG CAPS Take 1 capsule by mouth daily.    [name] UNABLE TO FIND Take 1 tablet by mouth daily. Occusight (Eye Med)     No current facility-administered medications for this visit.      Past Medical History:  Diagnosis Date  . Atrial fibrillation ([place])   . Depression   . Diverticulosis   . GERD (gastroesophageal reflux disease)   . Hyperlipidemia   . Hypertension   . Hypothyroid     Past Surgical History:  Procedure Laterality Date  . CATARACT EXTRACTION    . HAND SURGERY    . KNEE SURGERY    . ROTATOR CUFF REPAIR    . TOTAL ABDOMINAL HYSTERECTOMY      Social History   Social History  . Marital status: Married    Spouse name: Jenny Reichmann  . Number of children: 1  . Years of education: College   Occupational History  .  Other    [place] History Main Topics  . Smoking status: Never Smoker  . Smokeless tobacco: Never Used  . Alcohol use Yes     Comment: rare  . Drug use: No  . Sexual activity:  Not on file   Other Topics Concern  . Not on file   Social History Narrative   Patient lives at home spouse.   Caffeine use: 2 sodas weelky    Family History  Problem Relation Age of Onset  . Atrial fibrillation Mother   . Congestive Heart Failure Mother   . Heart disease Father   . Heart disease Brother   . Heart disease Brother     ROS: no fevers or chills, productive cough, hemoptysis, dysphasia, odynophagia, melena, hematochezia, dysuria, hematuria, rash, seizure activity, orthopnea, PND, pedal edema, claudication. Remaining systems are negative.  Physical Exam: Well-developed well-nourished in no acute distress.  Skin is warm and dry.  HEENT is normal.  Neck is supple.  Chest is clear to auscultation with normal expansion.  Cardiovascular exam is regular rate and rhythm.  Abdominal exam  nontender or distended. No masses palpated. Extremities show no edema. neuro grossly intact  ECG-Sinus bradycardia at a rate of 56. No ST changes.  A/P  1 Hypertension-blood pressure controlled. Continue present medications.  2 hyperlipidemia-Continue statin, management per primary care.  3 paroxysmal atrial fibrillation-We again discussed the options for treating her atrial fibrillation today. She has had 2 bouts since previous evaluation. We discussed the possibility of flecainide but she is concerned about cost. She will check with her insurance to see if this is affordable and will contact us if she would like to begin. She would need an exercise treadmill 2 weeks later. We also discussed possible referral for ablation and she will consider. Continue atenolol for rate control. Continue xarelto.   Kirk Ruths, MD

## 2016-05-24 ENCOUNTER — Ambulatory Visit (INDEPENDENT_AMBULATORY_CARE_PROVIDER_SITE_OTHER): Payer: Medicare Other | Admitting: Cardiology

## 2016-05-24 ENCOUNTER — Encounter: Payer: Self-pay | Admitting: Cardiology

## 2016-05-24 VITALS — BP 115/76 | HR 56 | Ht 61.0 in | Wt 209.4 lb

## 2016-05-24 DIAGNOSIS — E785 Hyperlipidemia, unspecified: Secondary | ICD-10-CM

## 2016-05-24 DIAGNOSIS — I4891 Unspecified atrial fibrillation: Secondary | ICD-10-CM

## 2016-05-24 DIAGNOSIS — I480 Paroxysmal atrial fibrillation: Secondary | ICD-10-CM

## 2016-05-24 DIAGNOSIS — I10 Essential (primary) hypertension: Secondary | ICD-10-CM

## 2016-05-24 NOTE — Patient Instructions (Signed)
Medication Instructions:   FLECAINIDE 50 MG TWICE DAILY = CALL IF WOULD LIKE TO START  Follow-Up:  Your physician wants you to follow-up in: [place] will receive a reminder letter in the mail two months in advance. If you don't receive a letter, please call our office to schedule the follow-up appointment.   If you need a refill on your cardiac medications before your next appointment, please call your pharmacy.

## 2016-06-05 ENCOUNTER — Telehealth: Payer: Self-pay | Admitting: Family Medicine

## 2016-06-05 DIAGNOSIS — G4733 Obstructive sleep apnea (adult) (pediatric): Secondary | ICD-10-CM

## 2016-06-05 DIAGNOSIS — Z9989 Dependence on other enabling machines and devices: Principal | ICD-10-CM

## 2016-06-05 NOTE — Telephone Encounter (Signed)
Caller name: Relationship to patient: Self Can be reached: [phone] Pharmacy:  Reason for call: Patient request a new mask for her CPAP machine and wants to know what she needs to do to get it. Patient is now on Medicare and thinks [facility] is where she needs to get it.

## 2016-06-05 NOTE — Telephone Encounter (Signed)
Called her back and LMOM- please call [facility] and see what they need.  I am glad to complete any ppw that is required

## 2016-06-09 ENCOUNTER — Telehealth: Payer: Self-pay | Admitting: Internal Medicine

## 2016-06-09 NOTE — Telephone Encounter (Signed)
Patient returning call best [phone]

## 2016-06-09 NOTE — Telephone Encounter (Signed)
Spoke with Lisa Acosta at [facility] primary care, asking for advise on how to proceed with pt's cpap therapy.  Lisa Acosta's questions were answered, advised that we'd be happy to see pt for sleep in [facility] office if they wish to refer pt.  Nothing further needed.

## 2016-06-09 NOTE — Addendum Note (Signed)
Addended by: Lamar Blinks C on: 06/09/2016 02:15 PM   Modules accepted: Orders

## 2016-06-09 NOTE — Telephone Encounter (Signed)
[facility] and was told we need to send over CPAP order in Epic and select supplies only, current office visit showing that pt was seen to discuss OSA.

## 2016-06-10 NOTE — Telephone Encounter (Signed)
-----   Message from Emi Holes, [place] sent at 06/09/2016  2:47 PM EDT ----- Regarding: RE: CPAP Mask Thanks. I faxed documents to [facility].   Pt would also like a referral to get established with Pulmonary here in the [place]. Thanks   ----- Message ----- From: Darreld Mclean, MD Sent: 06/09/2016   2:15 PM To: Emi Holes, CMA Subject: RE: CPAP Mask                                  Hi Tanesha- I did an addendum to her last visit with me- which was in June.  Also placed order in epic, I hope that I did it right!  You can certainly place a different order on my behalf if not done correctly Thank you JC ----- Message ----- From: Emi Holes, CMA Sent: 06/09/2016  11:56 AM To: Darreld Mclean, MD Subject: CPAP Mask                                      Hi Dr.Kippy Melena,  I spoke with [facility] about getting this patient a CPAP mask, they need [place] to send them an order for the mask and they need an office visit within the last 6 months where OSA has been discussed with pt and that she is compliant with her CPAP and that the pt is benefitting from her machine.   Once you send me this information I will fax it to [place]. Thanks

## 2016-06-12 ENCOUNTER — Ambulatory Visit (INDEPENDENT_AMBULATORY_CARE_PROVIDER_SITE_OTHER): Payer: Medicare Other | Admitting: Internal Medicine

## 2016-06-12 ENCOUNTER — Encounter: Payer: Self-pay | Admitting: Internal Medicine

## 2016-06-12 VITALS — BP 110/70 | HR 56 | Ht 61.0 in | Wt 211.1 lb

## 2016-06-12 DIAGNOSIS — K5730 Diverticulosis of large intestine without perforation or abscess without bleeding: Secondary | ICD-10-CM | POA: Diagnosis not present

## 2016-06-12 DIAGNOSIS — K219 Gastro-esophageal reflux disease without esophagitis: Secondary | ICD-10-CM | POA: Diagnosis not present

## 2016-06-12 NOTE — Progress Notes (Signed)
HISTORY OF PRESENT ILLNESS:  Lisa Acosta is a 65 y.o. female with hypertension, hyperlipidemia, obesity, and atrial fibrillation on Xarelto who presents today to establish GI care. Previous patient of Dr. Alice Acosta in [facility] until his departure. Patient tells me that she is undergone 2 previous colonoscopies without polyps. Last examination proximal one year ago with diverticulosis. Follow-up in 10 years recommended. Next, she tells me that she has had long-standing acid reflux for which she's been on chronic PPI in the form of pantoprazole. She tells me that she was hospitalized with with chest pain and dysphagia and underwent upper endoscopy and was found to have an esophageal ulcer. This was approximately 2 years ago. Her PPI was increased to twice daily. She also tells me that follow-up endoscopy demonstrated healing. She denies problems with dysphagia currently. Her reflux symptoms are controlled currently on 1 time daily PPI. Finally, she tells me that he has a history of fatty liver on imaging. No liver tests in the system. She has been chronically obese.  REVIEW OF SYSTEMS:  All non-GI ROS negative except for arthritis, cough, depression, fatigue, hearing problems, irregular heart rate  Past Medical History:  Diagnosis Date  . Atrial fibrillation ([place])   . Depression   . Diverticulitis   . Diverticulosis   . Esophageal ulcer   . GERD (gastroesophageal reflux disease)   . Hyperlipidemia   . Hypertension   . Hypothyroid     Past Surgical History:  Procedure Laterality Date  . CATARACT EXTRACTION    . HAND SURGERY    . KNEE SURGERY    . ROTATOR CUFF REPAIR    . TOTAL ABDOMINAL HYSTERECTOMY      Social History Lisa Acosta  reports that she has never smoked. She has never used smokeless tobacco. She reports that she drinks alcohol. She reports that she does not use drugs.  family history includes Atrial fibrillation in her mother; Congestive Heart Failure  in her mother; Heart disease in her brother, brother, and father.  Allergies  Allergen Reactions  . Dextran Anaphylaxis  . Dextrans   . Dextromethorphan Hbr Other (See Comments)    hallucinations  . Sulfa Antibiotics   . Tree Extract   . Penicillins Rash  . Sulfacetamide Sodium Rash       PHYSICAL EXAMINATION: Vital signs: BP 110/70   Pulse (!) 56   Ht 5\' 1"  (1.549 m)   Wt 211 lb 2 oz (95.8 kg)   SpO2 95%   BMI 39.89 kg/m   Constitutional: Pleasant, obese, generally well-appearing, no acute distress Psychiatric: alert and oriented x3, cooperative Eyes: extraocular movements intact, anicteric, conjunctiva pink Mouth: oral pharynx moist, no lesions Neck: supple without thyromegaly   lymph: no lymphadenopathy Cardiovascular: heart regular rate and rhythm, no murmur Lungs: clear to auscultation bilaterally Abdomen: soft, obese, nontender, nondistended, no obvious ascites, no peritoneal signs, normal bowel sounds, no organomegaly Rectal: Omitted Extremities: no lower extremity edema bilaterally Skin: no lesions on visible extremities Neuro: No focal deficits. Cranial Nerves intact. No asterixis.    ASSESSMENT:  #1. History of GERD. Asymptomatic on PPI #2. Colon cancer screening. Apparently up-to-date #3. Fatty liver by history   PLAN:  #1. Reflux precautions #2. Continue PPI given history of esophageal ulcer related to GERD #3. Exercise and weight loss. Discussed with her that fatty liver can lead to cirrhosis in some patients #4. Outside records requested for review and incorporation into this EMR #5. Resume general medical care with PCP. GI  follow-up as needed

## 2016-06-12 NOTE — Patient Instructions (Signed)
Please continue current medications.  Follow up as needed.  Thank you for choosing [facility]  Dr Scarlette Shorts

## 2016-06-13 ENCOUNTER — Telehealth: Payer: Self-pay | Admitting: Emergency Medicine

## 2016-06-13 NOTE — Telephone Encounter (Signed)
-----   Message from Darreld Mclean, MD sent at 06/09/2016  2:15 PM EDT ----- Regarding: RE: CPAP Mask Hi Owain Eckerman- I did an addendum to her last visit with me- which was in June.  Also placed order in epic, I hope that I did it right!  You can certainly place a different order on my behalf if not done correctly Thank you JC ----- Message ----- From: Emi Holes, CMA Sent: 06/09/2016  11:56 AM To: Darreld Mclean, MD Subject: CPAP Mask                                      Hi Dr.Copland,  I spoke with [facility] about getting this patient a CPAP mask, they need [place] to send them an order for the mask and they need an office visit within the last 6 months where OSA has been discussed with pt and that she is compliant with her CPAP and that the pt is benefitting from her machine.   Once you send me this information I will fax it to [facility]. Thanks

## 2016-06-13 NOTE — Telephone Encounter (Signed)
Faxed over all required documents to [facility] on 06/09/16. Called pt to inform that this has been done and that [facility] will contact her to setup an appointment for her to come in to get her supplies.

## 2016-06-14 DIAGNOSIS — Z23 Encounter for immunization: Secondary | ICD-10-CM | POA: Diagnosis not present

## 2016-06-14 DIAGNOSIS — I480 Paroxysmal atrial fibrillation: Secondary | ICD-10-CM | POA: Diagnosis not present

## 2016-06-19 ENCOUNTER — Telehealth: Payer: Self-pay | Admitting: Internal Medicine

## 2016-06-19 ENCOUNTER — Other Ambulatory Visit: Payer: Self-pay

## 2016-06-19 MED ORDER — PANTOPRAZOLE SODIUM 40 MG PO TBEC: 40.0000 mg | DELAYED_RELEASE_TABLET | Freq: Two times a day (BID) | ORAL | 3 refills | Status: DC

## 2016-06-19 NOTE — Telephone Encounter (Signed)
Spoke with pt and she is aware, new script sent to the pharmacy.

## 2016-06-19 NOTE — Telephone Encounter (Signed)
Pt states that she has started having issues with burning in her throat and epigastric pain in her chest. States this has happened to her before and then she started taking Protonix 40mg  BID. Pt is currently taking it daily. Pt wants to know if Dr. Henrene Pastor thinks it is ok for there to take it BID. Please advise.

## 2016-06-19 NOTE — Telephone Encounter (Signed)
Okay to increase to twice a day

## 2016-06-30 ENCOUNTER — Telehealth: Payer: Self-pay | Admitting: Emergency Medicine

## 2016-06-30 NOTE — Telephone Encounter (Signed)
Received form from [facility] requiring provider signature for equipment order. Form signed and faxed back to [facility]. Fax confirmation received, form sent for scanning.

## 2016-07-21 DIAGNOSIS — H35371 Puckering of macula, right eye: Secondary | ICD-10-CM | POA: Diagnosis not present

## 2016-07-21 DIAGNOSIS — H43813 Vitreous degeneration, bilateral: Secondary | ICD-10-CM | POA: Diagnosis not present

## 2016-07-21 DIAGNOSIS — H34831 Tributary (branch) retinal vein occlusion, right eye, with macular edema: Secondary | ICD-10-CM | POA: Diagnosis not present

## 2016-07-21 DIAGNOSIS — H35422 Microcystoid degeneration of retina, left eye: Secondary | ICD-10-CM | POA: Diagnosis not present

## 2016-07-28 ENCOUNTER — Telehealth: Payer: Self-pay | Admitting: Family Medicine

## 2016-07-28 DIAGNOSIS — Z5181 Encounter for therapeutic drug level monitoring: Secondary | ICD-10-CM

## 2016-07-28 NOTE — Telephone Encounter (Addendum)
Relation to [id number] Call back number: [phone]   Reason for call:   Dr. Kayleen Memos [facility] [address], [place], [place] [address] [phone] electrophysiologist prescribed flecainide, speciliast would like to patient to start medication this weekend and would like PCP to order EKG a week after start of medication, please advise         from [facility] flecainide

## 2016-07-28 NOTE — Telephone Encounter (Signed)
Called her and LMOM- we are glad to do an EKG for her. I ordered this for her; she can do as a nurse visit or schedule a visit with me, whichever she prefers

## 2016-07-31 NOTE — Telephone Encounter (Signed)
Called and spoke with pt. Tried to put pt on nurse schedule for Monday 11/27 but that is unavailable so pt has been scheduled with Dr. Lorelei Pont on 11/27 @ 4:30pm.

## 2016-08-07 ENCOUNTER — Ambulatory Visit (INDEPENDENT_AMBULATORY_CARE_PROVIDER_SITE_OTHER): Payer: Medicare Other | Admitting: Family Medicine

## 2016-08-07 ENCOUNTER — Ambulatory Visit (HOSPITAL_BASED_OUTPATIENT_CLINIC_OR_DEPARTMENT_OTHER)
Admission: RE | Admit: 2016-08-07 | Discharge: 2016-08-07 | Disposition: A | Discharge status: Home / Self Care | Payer: Medicare Other | Source: Ambulatory Visit | Attending: Family Medicine | Admitting: Family Medicine

## 2016-08-07 ENCOUNTER — Encounter: Payer: Self-pay | Admitting: Family Medicine

## 2016-08-07 VITALS — BP 118/80 | HR 78 | Temp 98.8°F | Ht 61.0 in | Wt 211.1 lb

## 2016-08-07 DIAGNOSIS — Z79899 Other long term (current) drug therapy: Secondary | ICD-10-CM

## 2016-08-07 DIAGNOSIS — R05 Cough: Secondary | ICD-10-CM | POA: Diagnosis not present

## 2016-08-07 DIAGNOSIS — Z23 Encounter for immunization: Secondary | ICD-10-CM | POA: Diagnosis not present

## 2016-08-07 DIAGNOSIS — I480 Paroxysmal atrial fibrillation: Secondary | ICD-10-CM

## 2016-08-07 DIAGNOSIS — M19041 Primary osteoarthritis, right hand: Secondary | ICD-10-CM | POA: Diagnosis not present

## 2016-08-07 DIAGNOSIS — M19042 Primary osteoarthritis, left hand: Secondary | ICD-10-CM

## 2016-08-07 NOTE — Progress Notes (Signed)
[facility], [place], [place] [address] [phone]  Date:  08/07/2016   Name:  Lisa Acosta   DOB:  1950/12/11   MRN:  [id number]  PCP:  Lamar Blinks, MD    Chief Complaint: Follow-up   History of Present Illness:  Lisa Acosta is a 65 y.o. very pleasant female patient who presents with the following:  Here today for a follow-up visit- she was started on flecainide per her cardiologist at [facility]- per his note from 06/14/16  1) Paroxysmal atrial fibrillation - Symptomatic, has needed ED visits; no prior cardioversion or antiarrhythmic drugs or ablation. We discussed the treatment goals for atrial fibrillation including management of stroke prophylaxis and options of a rate control strategy versus a rhythm control strategy. For the former we confirmed the patients stroke risk factors based on the CHADS-VASC scoring system including Hypertension (1), Age 27-74 (1) and Sex = female (1) for a score of 3. Consequently I have recommended rivaroxaban. With respect to the latter based on the patient's symptomatic status she is a good candidate for rhythm control. We discussed that the goal of rhythm control therapy based upon available data is limited exclusively to symptom control and not to any benefit in longevity or an amelioration of the need for anticoagulation. She has not taken antiarrhythmic drugs previously and at this time I recommend a trial of flecainide for symptom control. She understands the risks and benefits of drug therapy for rhythm control and alternatives including rate control and possible ablation.  2) CHADSVASC 3 (HTN, age, gender; +- borderline DM) on rivaroxaban; we talked about antidotes, alternatives RECOMMENDATIONS and PLAN:  Acquire the following records: Stress test done in hospital at [facility] Nov 2015  Educational plans to include: Atrial fibrillation, AF ablation;  flecainide Flecainide 50 bid after stress results ECG a week after flecainide in PCP   She just started the flecainide a week ago- delayed starting it until she got back from visiting her mom in [place].  She hast noted "some bloating", but otherwise no symptoms that she has noted  No CP or SOB She did have some a fib last week but this is now resolved.  She is able to sense when she is in a fib most of the time  She feels like her belly is bloated, but is not having any LE edema or swelling.  She is moving her bowels normally She is frustrated by some recent weight gain She is coughing a lot recently as well- she has noted this over the last few weeks.  No fever and she does not feel ill She does not lie flat at night but this is not new; she has used an adjustable bed with the head elevated for a long time due to her OSA.    She is still working.  She is an Web designer. 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  She wonders if she might be able to get disability but is not sure how to go about this process.  She had not planned to retire for a few more years as she and her husband continue to need income  She would like to have a pneumonia shot today- will do a prevnar 11 today for her Flu shot done for the year already Her mammogram IS up to date- not done  in our system  Wt Readings from Last 3 Encounters:  08/07/16 211 lb 2 oz (95.8 kg)  06/12/16 211 lb 2 oz (95.8 kg)  05/24/16 209 lb 6.4 oz (95 kg)       Patient Active Problem List   Diagnosis Date Noted  . PAF (paroxysmal atrial fibrillation) ([place]) 03/16/2016  . Sleep apnea 03/16/2016  . Hypothyroid 03/16/2016  . Obesity (BMI 30-39.9) 03/16/2016  . Atrial fibrillation ([place]) 01/05/2016  . Palpitations 11/10/2015  . Essential hypertension 11/10/2015  . Hyperlipidemia 11/10/2015    Past Medical History:  Diagnosis Date  . Atrial  fibrillation ([place])   . Depression   . Diverticulitis   . Diverticulosis   . Esophageal ulcer   . GERD (gastroesophageal reflux disease)   . Hyperlipidemia   . Hypertension   . Hypothyroid     Past Surgical History:  Procedure Laterality Date  . CATARACT EXTRACTION    . HAND SURGERY    . KNEE SURGERY    . ROTATOR CUFF REPAIR    . TOTAL ABDOMINAL HYSTERECTOMY      Social History  Substance Use Topics  . Smoking status: Never Smoker  . Smokeless tobacco: Never Used  . Alcohol use Yes     Comment: rare    Family History  Problem Relation Age of Onset  . Atrial fibrillation Mother   . Congestive Heart Failure Mother   . Heart disease Father   . Heart disease Brother   . Heart disease Brother     Allergies  Allergen Reactions  . Dextran Anaphylaxis  . Dextrans   . Dextromethorphan Hbr Other (See Comments)    hallucinations  . Sulfa Antibiotics   . Tree Extract   . Penicillins Rash  . Sulfacetamide Sodium Rash    Medication list has been reviewed and updated.  Current Outpatient Prescriptions on File Prior to Visit  Medication Sig Dispense Refill  . amLODipine (NORVASC) 5 MG tablet Take 1 tablet (5 mg total) by mouth daily. 90 tablet 3  . atenolol (TENORMIN) 25 MG tablet Take 1 tablet (25 mg total) by mouth 2 (two) times daily. 180 tablet 3  . BIOTIN PO Take 1 tablet by mouth daily.    . Cholecalciferol (D 2000) 2000 units TABS Take 1 tablet by mouth daily.    [name] FLUoxetine (PROZAC) 40 MG capsule Take 40 mg by mouth 2 (two) times daily.     . hydrochlorothiazide (HYDRODIURIL) 12.5 MG tablet Take 1 tablet (12.5 mg total) by mouth daily. (Patient taking differently: Take 12.5 mg by mouth as needed. ) 30 tablet 1  . Krill Oil 1000 MG CAPS Take 1 capsule by mouth daily.    [name] lactobacillus acidophilus (BACID) TABS tablet Take 1 tablet by mouth daily.    [name] levothyroxine (SYNTHROID, LEVOTHROID) 125 MCG tablet Take 125 mcg by mouth daily before breakfast.    . lovastatin  (ALTOPREV) 40 MG 24 hr tablet Take 40 mg by mouth at bedtime.    . magnesium 30 MG tablet Take 30 mg by mouth daily.    [name] OVER THE COUNTER MEDICATION OcuSight.  Take 1 capsule daily.    . pantoprazole (PROTONIX) 40 MG tablet Take 1 tablet (40 mg total) by mouth 2 (two) times daily. 60 tablet 3  . Potassium 75 MG TABS Take 1 tablet by mouth daily.    . rivaroxaban (XARELTO) 20 MG TABS tablet Take 1 tablet (20 mg total) by mouth daily with supper. 90 tablet 3  .  traZODone (DESYREL) 50 MG tablet Take 50 mg by mouth at bedtime.    . Turmeric 450 MG CAPS Take 1 capsule by mouth daily.    [name] UNABLE TO FIND Take 1 tablet by mouth daily. Occusight (Eye Med)     No current facility-administered medications on file prior to visit.     Review of Systems:  As per HPI- otherwise negative.   Physical Examination: Vitals:   08/07/16 1627  BP: 118/80  Pulse: 78  Temp: 98.8 F (37.1 C)   Vitals:   08/07/16 1627  Weight: 211 lb 2 oz (95.8 kg)  Height: 5\' 1"  (1.549 m)   Body mass index is 39.89 kg/m. Ideal Body Weight: Weight in (lb) to have BMI = 25: 132  GEN: WDWN, NAD, Non-toxic, A & O x 3, overweight, looks well HEENT: Atraumatic, Normocephalic. Neck supple. No masses, No LAD. Ears and Nose: No external deformity. CV: RRR, No M/G/R. No JVD. No thrill. No extra heart sounds. PULM: CTA B, no wheezes, crackles, rhonchi. No retractions. No resp. distress. No accessory muscle use. ABD: S, NT, ND, +BS. No rebound. No HSM. EXTR: No c/c.  Both hands show severe OA in the fingers with significant ulnar deviation of most fingers and thickening/ stiffness of the IP joints.  The joints are tender to palpation  NEURO Normal gait.  PSYCH: Normally interactive. Conversant. Not depressed or anxious appearing.  Calm demeanor.   EKG: SR with first degree AB block.  PR interval 220 ms.  otherwise normal Assessment and Plan: Medication management - Plan: EKG 12-Lead  Immunization due - Plan:  Pneumococcal conjugate vaccine 13-valent IM  Paroxysmal atrial fibrillation ([place]) - Plan: DG Chest 2 View  Primary osteoarthritis of both hands  Here today to have an EKG to monitor recent addition of flecainide.  EKG is reassuring- will send to her local cardiologist Dr. Stanford Breed and will also fax to Dr. Clydene Laming at [facility] She is currently in [place], and is anticoagulated with xarelto prevnar 13 today She has significant OA in both hands which is impeding her ability to do her job.  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  She may choose to consult with a lawyer about this issue.   Signed Lamar Blinks, MD

## 2016-08-07 NOTE — Patient Instructions (Addendum)
Your EKG looks ok to me- I will sent this to Dr. Stanford Breed and will fax to your doctor at [facility] as well.  Dr. Curt Bears does electrophysiology here at the [facility] also if you want to see someone closer to home.   I do wonder if you may be able to use your long term disability policy through your job for your hand problems.  Since you do not have an accessible HR person you may want to seek legal advice about how to go about getting these benefits You got your pneumonia shot today (prevnar 13)- we will do a booster in one year  Let me know if your depression is getting worse or if you are not doing ok in this regard  Please stop by the imaging dept on the way out today and have a chest x-ray

## 2016-08-07 NOTE — Progress Notes (Signed)
Pre visit review using our clinic review tool, if applicable. No additional management support is needed unless otherwise documented below in the visit note. 

## 2016-08-15 ENCOUNTER — Ambulatory Visit (INDEPENDENT_AMBULATORY_CARE_PROVIDER_SITE_OTHER): Payer: Medicare Other | Admitting: Pulmonary Disease

## 2016-08-15 ENCOUNTER — Encounter: Payer: Self-pay | Admitting: Pulmonary Disease

## 2016-08-15 VITALS — BP 128/82 | HR 59 | Ht 61.0 in | Wt 215.4 lb

## 2016-08-15 DIAGNOSIS — R0602 Shortness of breath: Secondary | ICD-10-CM

## 2016-08-15 DIAGNOSIS — Z6841 Body Mass Index (BMI) 40.0 and over, adult: Secondary | ICD-10-CM

## 2016-08-15 DIAGNOSIS — G4733 Obstructive sleep apnea (adult) (pediatric): Secondary | ICD-10-CM | POA: Diagnosis not present

## 2016-08-15 NOTE — Progress Notes (Signed)
Past Surgical History She  has a past surgical history that includes Total abdominal hysterectomy; Rotator cuff repair; Hand surgery; Knee surgery; and Cataract extraction.  Allergies  Allergen Reactions  . Dextran Anaphylaxis  . Dextrans   . Dextromethorphan Hbr Other (See Comments)    hallucinations  . Sulfa Antibiotics   . Tree Extract   . Penicillins Rash  . Sulfacetamide Sodium Rash    Family History Her family history includes Atrial fibrillation in her mother; Congestive Heart Failure in her mother; Heart disease in her brother, brother, and father.  Social History She  reports that she has never smoked. She has never used smokeless tobacco. She reports that she drinks alcohol. She reports that she does not use drugs.  Review of systems Constitutional: Positive for unexpected weight change. Negative for fever.  HENT: Positive for congestion and trouble swallowing. Negative for dental problem, ear pain, nosebleeds, postnasal drip, rhinorrhea, sinus pressure, sneezing and sore throat.   Eyes: Negative for redness and itching.  Respiratory: Positive for cough. Negative for chest tightness, shortness of breath and wheezing.   Cardiovascular: Positive for palpitations. Negative for leg swelling.  Gastrointestinal: Negative for nausea and vomiting.       Acid heartburn / Indigestion  Genitourinary: Negative for dysuria.  Musculoskeletal: Positive for arthralgias. Negative for joint swelling.  Skin: Negative for rash.  Neurological: Negative for headaches.  Hematological: Does not bruise/bleed easily.  Psychiatric/Behavioral: Positive for dysphoric mood. The patient is not nervous/anxious.     Current Outpatient Prescriptions on File Prior to Visit  Medication Sig  . amLODipine (NORVASC) 5 MG tablet Take 1 tablet (5 mg total) by mouth daily.  [name] atenolol (TENORMIN) 25 MG tablet Take 1 tablet (25 mg total) by mouth 2 (two) times daily.  [name] BIOTIN PO Take 1 tablet by mouth daily.   . Cholecalciferol (D 2000) 2000 units TABS Take 1 tablet by mouth daily.  . flecainide (TAMBOCOR) 100 MG tablet Take 1 tablet by mouth 2 (two) times daily.  [name] FLUoxetine (PROZAC) 40 MG capsule Take 40 mg by mouth 2 (two) times daily.   Javier Docker Oil 1000 MG CAPS Take 1 capsule by mouth daily.  [name] lactobacillus acidophilus (BACID) TABS tablet Take 1 tablet by mouth daily.  [name] levothyroxine (SYNTHROID, LEVOTHROID) 125 MCG tablet Take 125 mcg by mouth daily before breakfast.  . lovastatin (ALTOPREV) 40 MG 24 hr tablet Take 40 mg by mouth at bedtime.  . magnesium 30 MG tablet Take 30 mg by mouth daily.  . pantoprazole (PROTONIX) 40 MG tablet Take 1 tablet (40 mg total) by mouth 2 (two) times daily.  . Potassium 75 MG TABS Take 1 tablet by mouth daily.  . rivaroxaban (XARELTO) 20 MG TABS tablet Take 1 tablet (20 mg total) by mouth daily with supper.  . traZODone (DESYREL) 50 MG tablet Take 50 mg by mouth at bedtime.  . Turmeric 450 MG CAPS Take 1 capsule by mouth daily.   No current facility-administered medications on file prior to visit.     Chief Complaint  Patient presents with  . SLEEP CONSULT    Referred by Dr Edilia Bo. Current CPAP. Sleep study about 10 years ago. Epworth Score: 7    Cardiac tests Echo 11/12/15 >> EF 55 to [id number], grade 1 diastolic DD  Past medical history She  has a past medical history of Atrial fibrillation ([place]); Depression; Diverticulitis; Diverticulosis; Esophageal ulcer; GERD (gastroesophageal reflux disease); Hyperlipidemia; Hypertension; and Hypothyroid.  Vital signs BP 128/82 (  BP Location: Left Arm, Cuff Size: Normal)   Pulse (!) 59   Ht 5\' 1"  (1.549 m)   Wt 215 lb 6.4 oz (97.7 kg)   SpO2 94%   BMI 40.70 kg/m   History of Present Illness Lisa Acosta is a 65 y.o. female for evaluation of sleep problems.  She had sleep study while living in [place] and was diagnosed with sleep apnea.  She has been on CPAP for the past 10 years.  She has not had any  changes to her set up since originally getting CPAP.  She was working with [facility], but changed to [facility] due to insurance coverage.  She was diagnosed with A fib last summer and started on xarelto.  She has noticed trouble with her breathing with exertion.  She has gained weight recently.  She never smoked, but her parents were heavy smokers and husband is a heavy smoker.  She gets cough with congestion.  She is also limited in her activity due to arthritis.  She uses CPAP nightly.  She snores and wakes up feeling choked if she doesn't use CPAP.  She had a change to her CPAP mask, and this works better.  She goes to sleep at 10 pm.  She falls asleep after 30 minutes.  She wakes up some times to use the bathroom.  She gets out of bed at 6 am on weekdays, but sleeps until 7 am on weekends.  She has been feeling more tired in the morning recently.  She is not sure if her CPAP is giving same pressure like it used to.  She denies morning headache.  She has been taking trazodone for years to help her sleep.  This also helps with her mood.  She used to drinks sodas, but had to stop caffeine due to a fib.  She has noticed yawning more during the day.  She denies sleep walking, sleep talking, bruxism, or nightmares.  There is no history of restless legs.  She denies sleep hallucinations, sleep paralysis, or cataplexy.  The Epworth score is 7out of 24.   Physical Exam:  General - No distress ENT - No sinus tenderness, no oral exudate, no LAN, no thyromegaly, TM clear, pupils equal/reactive. MP 4 Cardiac - s1s2 regular, no murmur, pulses symmetric Chest - No wheeze/rales/dullness, good air entry, normal respiratory excursion Back - No focal tenderness Abd - Soft, non-tender, no organomegaly, + bowel sounds Ext - No edema Neuro - Normal strength, cranial nerves intact Skin - No rashes Psych - Normal mood, and behavior   CXR 08/07/16 - cardiomegaly, basilar ATX   CMP Latest Ref Rng & Units  03/14/2016 10/31/2015 02/18/2011  Glucose 65 - 99 mg/dL 117(H) 103(H) 146(H)  BUN 6 - 20 mg/dL 14 13 13   Creatinine 0.44 - 1.00 mg/dL 0.60 0.61 0.57  Sodium 135 - 145 mmol/L 138 139 141  Potassium 3.5 - 5.1 mmol/L 3.7 3.5 3.6  Chloride 101 - 111 mmol/L 104 103 105  CO2 22 - 32 mmol/L 23 29 28   Calcium 8.9 - 10.3 mg/dL 9.5 9.1 8.3(L)    CBC Latest Ref Rng & Units 03/14/2016 10/31/2015 02/18/2011  WBC 4.0 - 10.5 K/uL 8.0 7.8 12.8(H)  Hemoglobin 12.0 - 15.0 g/dL 14.6 12.1 12.2  Hematocrit 36.0 - 46.0 % 42.1 37.5 36.3  Platelets 150 - 400 K/uL 252 226 210     Discussion: She has history of obstructive sleep apnea, and has been on therapy with CPAP.  She has  noticed progressive difficulty with her sleep and daytime sleepiness.  She was also recently diagnosed with atrial fibrillation.  Her CPAP set up is quite old, and I am not sure she is on adequate set up at this time.  She also c/o progressive shortness of breath with exertion.  She has significant second hand tobacco exposure, and reports cough with chest congestion.  She is also relatively sedentary, and has gained weight.  She likely has a component of deconditioning.  Assessment/plan:  Obstructive sleep apnea. - will arrange for home sleep study to assess current status of sleep apnea - will then arrange for new CPAP set up  Dyspnea on exertion. - will arrange for pulmonary function testing - she might need further assessment with cardiology  Obesity with deconditioning. - discussed importance of weight loss - encouraged her to start a gradual exercise regimen   Patient Instructions  Will arrange for home sleep study Will arrange for pulmonary function test  Will call to schedule follow up after above tests reviewed    Chesley Mires, M.D. Pager [phone] 08/15/2016, 5:16 PM

## 2016-08-15 NOTE — Patient Instructions (Signed)
Will arrange for home sleep study Will arrange for pulmonary function test  Will call to schedule follow up after above tests reviewed

## 2016-08-15 NOTE — Progress Notes (Signed)
   Subjective:    Patient ID: Lisa Acosta, female    DOB: January 03, 1951, 65 y.o.   MRN: [id number]  HPI    Review of Systems  Constitutional: Positive for unexpected weight change. Negative for fever.  HENT: Positive for congestion and trouble swallowing. Negative for dental problem, ear pain, nosebleeds, postnasal drip, rhinorrhea, sinus pressure, sneezing and sore throat.   Eyes: Negative for redness and itching.  Respiratory: Positive for cough. Negative for chest tightness, shortness of breath and wheezing.   Cardiovascular: Positive for palpitations. Negative for leg swelling.  Gastrointestinal: Negative for nausea and vomiting.       Acid heartburn / Indigestion  Genitourinary: Negative for dysuria.  Musculoskeletal: Positive for arthralgias. Negative for joint swelling.  Skin: Negative for rash.  Neurological: Negative for headaches.  Hematological: Does not bruise/bleed easily.  Psychiatric/Behavioral: Positive for dysphoric mood. The patient is not nervous/anxious.        Objective:   Physical Exam        Assessment & Plan:

## 2016-08-27 DIAGNOSIS — G4733 Obstructive sleep apnea (adult) (pediatric): Secondary | ICD-10-CM | POA: Diagnosis not present

## 2016-08-29 ENCOUNTER — Ambulatory Visit (HOSPITAL_COMMUNITY)
Admission: RE | Admit: 2016-08-29 | Discharge: 2016-08-29 | Disposition: A | Discharge status: Home / Self Care | Payer: Medicare Other | Source: Ambulatory Visit | Attending: Pulmonary Disease | Admitting: Pulmonary Disease

## 2016-08-29 DIAGNOSIS — R0602 Shortness of breath: Secondary | ICD-10-CM

## 2016-08-29 LAB — PULMONARY FUNCTION TEST
DL/VA % pred: 86 %
DL/VA: 3.79 ml/min/mmHg/L
DLCO unc % pred: 75 %
DLCO unc: 15.17 ml/min/mmHg
FEF 25-75 Post: 2.73 L/sec
FEF 25-75 Pre: 2.53 L/sec
FEF2575-%Change-Post: 7 %
FEF2575-%Pred-Post: 143 %
FEF2575-%Pred-Pre: 133 %
FEV1-%Change-Post: 3 %
FEV1-%Pred-Post: 105 %
FEV1-%Pred-Pre: 101 %
FEV1-Post: 2.21 L
FEV1-Pre: 2.13 L
FEV1FVC-%Change-Post: 1 %
FEV1FVC-%Pred-Pre: 107 %
FEV6-%Change-Post: 3 %
FEV6-%Pred-Post: 99 %
FEV6-%Pred-Pre: 96 %
FEV6-Post: 2.63 L
FEV6-Pre: 2.54 L
FEV6FVC-%Change-Post: 1 %
FEV6FVC-%Pred-Post: 104 %
FEV6FVC-%Pred-Pre: 102 %
FVC-%Change-Post: 2 %
FVC-%Pred-Post: 95 %
FVC-%Pred-Pre: 93 %
FVC-Post: 2.63 L
FVC-Pre: 2.57 L
Post FEV1/FVC ratio: 84 %
Post FEV6/FVC ratio: 100 %
Pre FEV1/FVC ratio: 83 %
Pre FEV6/FVC Ratio: 99 %
RV % pred: 73 %
RV: 1.45 L
TLC % pred: 91 %
TLC: 4.2 L

## 2016-08-29 MED ORDER — ALBUTEROL SULFATE (2.5 MG/3ML) 0.083% IN NEBU
2.5000 mg | INHALATION_SOLUTION | Freq: Once | RESPIRATORY_TRACT | Status: AC
  Administered 2016-08-29: 2.5 mg via RESPIRATORY_TRACT

## 2016-08-31 ENCOUNTER — Telehealth: Payer: Self-pay | Admitting: Pulmonary Disease

## 2016-08-31 ENCOUNTER — Encounter: Payer: Self-pay | Admitting: Pulmonary Disease

## 2016-08-31 ENCOUNTER — Other Ambulatory Visit: Payer: Self-pay | Admitting: *Deleted

## 2016-08-31 DIAGNOSIS — G4733 Obstructive sleep apnea (adult) (pediatric): Secondary | ICD-10-CM | POA: Diagnosis not present

## 2016-08-31 HISTORY — DX: Obstructive sleep apnea (adult) (pediatric): G47.33

## 2016-08-31 NOTE — Telephone Encounter (Signed)
HST 08/27/16 >> AHI 39.5, SaO2 low 77%.   Will have my nurse inform pt that sleep study shows severe sleep apnea.  Please send order for auto CPAP range 5 to 15 cm H2O with heated humidity and mask of choice.  Have download sent 1 month after starting CPAP and set up ROV 2 months after starting CPAP.  ROV can be with me or NP.

## 2016-09-01 NOTE — Telephone Encounter (Signed)
LMTCB

## 2016-09-06 NOTE — Telephone Encounter (Signed)
Pt aware of results and voiced her understanding. Order has been sent to [facility] per pt's request. Pt states she will call once she has been set up on cpap machine, to schedule a f/u. Nothing further needed.

## 2016-09-06 NOTE — Telephone Encounter (Signed)
Pt returning call for results.Lisa Acosta ° °

## 2016-09-06 NOTE — Telephone Encounter (Signed)
lmomtcb x 2  

## 2016-09-16 ENCOUNTER — Telehealth: Payer: Self-pay | Admitting: Cardiology

## 2016-09-16 NOTE — Telephone Encounter (Signed)
Error  Kerin Ransom PA-C 09/16/2016 10:43 AM

## 2016-09-22 ENCOUNTER — Encounter: Payer: Self-pay | Admitting: Pulmonary Disease

## 2016-09-22 DIAGNOSIS — R0600 Dyspnea, unspecified: Secondary | ICD-10-CM | POA: Insufficient documentation

## 2016-09-25 ENCOUNTER — Telehealth: Payer: Self-pay | Admitting: Family Medicine

## 2016-09-25 ENCOUNTER — Other Ambulatory Visit: Payer: Self-pay | Admitting: Emergency Medicine

## 2016-09-25 MED ORDER — LEVOTHYROXINE SODIUM 125 MCG PO TABS: 125.0000 ug | ORAL_TABLET | Freq: Every day | ORAL | 1 refills | Status: DC

## 2016-09-25 NOTE — Telephone Encounter (Signed)
Call back number:[phone] Pharmacy: [id number] - [place], [place] [phone] (Phone) [phone] (Fax)     Reason for call:  Patient requesting a refill levothyroxine (SYNTHROID, LEVOTHROID) 125 MCG tablet

## 2016-09-25 NOTE — Telephone Encounter (Signed)
Refill on levothyroxine sent to pharmacy per pt request.

## 2016-10-03 ENCOUNTER — Telehealth: Payer: Self-pay | Admitting: Family Medicine

## 2016-10-03 NOTE — Telephone Encounter (Signed)
Relation to [id number] Call back number:[phone] Pharmacy: [id number] - [place], [place] [phone] ([phone] (Fax)     Reason for call:  Patient requesting 1 month supply lovastatin (ALTOPREV) 40 MG 24 hr tablet, please send to retail due to patient having 2 pills left.

## 2016-10-04 ENCOUNTER — Other Ambulatory Visit: Payer: Self-pay | Admitting: Emergency Medicine

## 2016-10-04 MED ORDER — LOVASTATIN ER 40 MG PO TB24: 40.0000 mg | ORAL_TABLET | Freq: Every day | ORAL | 0 refills | Status: DC

## 2016-10-04 NOTE — Telephone Encounter (Signed)
Sent to pharmacy per pt request

## 2016-10-13 DIAGNOSIS — H35422 Microcystoid degeneration of retina, left eye: Secondary | ICD-10-CM | POA: Diagnosis not present

## 2016-10-13 DIAGNOSIS — H43813 Vitreous degeneration, bilateral: Secondary | ICD-10-CM | POA: Diagnosis not present

## 2016-10-13 DIAGNOSIS — H34831 Tributary (branch) retinal vein occlusion, right eye, with macular edema: Secondary | ICD-10-CM | POA: Diagnosis not present

## 2016-10-13 DIAGNOSIS — H35371 Puckering of macula, right eye: Secondary | ICD-10-CM | POA: Diagnosis not present

## 2016-10-18 ENCOUNTER — Ambulatory Visit (INDEPENDENT_AMBULATORY_CARE_PROVIDER_SITE_OTHER): Payer: Medicare Other | Admitting: Family Medicine

## 2016-10-18 VITALS — BP 137/82 | HR 60 | Temp 98.6°F | Ht 61.0 in | Wt 215.6 lb

## 2016-10-18 DIAGNOSIS — I480 Paroxysmal atrial fibrillation: Secondary | ICD-10-CM

## 2016-10-18 DIAGNOSIS — H578 Other specified disorders of eye and adnexa: Secondary | ICD-10-CM | POA: Diagnosis not present

## 2016-10-18 DIAGNOSIS — K219 Gastro-esophageal reflux disease without esophagitis: Secondary | ICD-10-CM

## 2016-10-18 DIAGNOSIS — F320 Major depressive disorder, single episode, mild: Secondary | ICD-10-CM | POA: Diagnosis not present

## 2016-10-18 DIAGNOSIS — R002 Palpitations: Secondary | ICD-10-CM

## 2016-10-18 DIAGNOSIS — R05 Cough: Secondary | ICD-10-CM | POA: Diagnosis not present

## 2016-10-18 DIAGNOSIS — H579 Unspecified disorder of eye and adnexa: Secondary | ICD-10-CM

## 2016-10-18 DIAGNOSIS — R6889 Other general symptoms and signs: Secondary | ICD-10-CM

## 2016-10-18 DIAGNOSIS — E785 Hyperlipidemia, unspecified: Secondary | ICD-10-CM

## 2016-10-18 DIAGNOSIS — E034 Atrophy of thyroid (acquired): Secondary | ICD-10-CM

## 2016-10-18 DIAGNOSIS — R053 Chronic cough: Secondary | ICD-10-CM

## 2016-10-18 DIAGNOSIS — G4700 Insomnia, unspecified: Secondary | ICD-10-CM

## 2016-10-18 MED ORDER — FLECAINIDE ACETATE 100 MG PO TABS: 100.0000 mg | ORAL_TABLET | Freq: Two times a day (BID) | ORAL | 3 refills | Status: DC

## 2016-10-18 MED ORDER — AMLODIPINE BESYLATE 5 MG PO TABS: 5.0000 mg | ORAL_TABLET | Freq: Every day | ORAL | 3 refills | Status: DC

## 2016-10-18 MED ORDER — FLUOXETINE HCL 40 MG PO CAPS: 40.0000 mg | ORAL_CAPSULE | Freq: Two times a day (BID) | ORAL | 3 refills | Status: DC

## 2016-10-18 MED ORDER — PANTOPRAZOLE SODIUM 40 MG PO TBEC: 40.0000 mg | DELAYED_RELEASE_TABLET | Freq: Every day | ORAL | 3 refills | Status: DC

## 2016-10-18 MED ORDER — MONTELUKAST SODIUM 10 MG PO TABS: 10.0000 mg | ORAL_TABLET | Freq: Every day | ORAL | 3 refills | Status: DC

## 2016-10-18 MED ORDER — ATENOLOL 25 MG PO TABS: 25.0000 mg | ORAL_TABLET | Freq: Two times a day (BID) | ORAL | 3 refills | Status: DC

## 2016-10-18 MED ORDER — LEVOTHYROXINE SODIUM 125 MCG PO TABS: 125.0000 ug | ORAL_TABLET | Freq: Every day | ORAL | 3 refills | Status: DC

## 2016-10-18 MED ORDER — TRAZODONE HCL 50 MG PO TABS: 50.0000 mg | ORAL_TABLET | Freq: Every day | ORAL | 3 refills | Status: DC

## 2016-10-18 MED ORDER — RIVAROXABAN 20 MG PO TABS: 20.0000 mg | ORAL_TABLET | Freq: Every day | ORAL | 3 refills | Status: DC

## 2016-10-18 MED ORDER — LOVASTATIN ER 40 MG PO TB24: 40.0000 mg | ORAL_TABLET | Freq: Every day | ORAL | 3 refills | Status: DC

## 2016-10-18 NOTE — Progress Notes (Signed)
Pre visit review using our clinic review tool, if applicable. No additional management support is needed unless otherwise documented below in the visit note. 

## 2016-10-18 NOTE — Patient Instructions (Signed)
Please keep you eyes very well lubricated, and avoid rubbing your eyes If you are NOT continuing to improve please call Dr. Baird Cancer right away- Sooner if worse.  Let's try singulair 10 mg once a day- this may help with an allergic component of your eye symptoms as well as your cough.  If not helpful you can stop using it and please let me know

## 2016-10-18 NOTE — Progress Notes (Signed)
[place] [facility] [address], [place], [place] [address] [id number] [phone]  Date:  10/18/2016   Name:  Lisa Acosta   DOB:  [birth year]   MRN:  [id number]  PCP:  Lamar Blinks, MD    Chief Complaint: Itchy Eye (c/o bilateral itchy, watery eyes. sx's started Monday afternoon. Pt states eyes are sensitive to light. Vision uncorrected: R: 20/25 L: 20/50 and Both: 20/25) and Cough (Still coughing since Nov. Cough mainly dry but occ mucus. )   History of Present Illness:  Lisa Acosta is a 66 y.o. very pleasant female patient who presents with the following:  Last seen by myself in November .  She has a fib and uses flecainide, she is also on norvasc, atenolol, prozac, synthroid, lovastatin, xarelto, trazodone, protonix. She is tolerating all of these medications well and has not noted any feeling of a fib  Here today with concern of an issue with her eyes. Today is Wednesday- on Monday her bilateral eyes were" itching and burning." They "felt like pinkeye," but no discharge or redness noted.  This am she did have a little discharge on her left lower lid that she was able to wipe away,it has not come back.  No crust in the am Her eyes feel better today- they are still a bit sensitive to light but overall much more comfortable  She had a shot in her right eye on Friday - this is done for macular edema per her opthal Dr. Sherlynn Stalls.  The shot was just in the RIGHT eye- she has had this before and generally does fine with them  She does have a spot where she has lost her vision in the right eye due to the macular edema.   She has not noted any change in her vision from her recent baseline She wears readers- no other lenses used. She has had cataracts removed from both eyes as well  She has noted a cough for the last couple of months- she may bring up some mucus on occasion No sneezing or runny nose No fever noted No nausea, vomiting  or diarrhea No significant ST  She is complaint with her CPAP machine  Vision today 20/50 RIGHT eye, 20/25 LEFT eye 20/25 both eyes  She has seen pulmonology and had PFTs just recently that looked ok  Called Dr. Baird Cancer who kindly discussed her with me over the phone-  Ocular lubrication, avoid rubbing her eyes If she is not improving she is to call them right away   Lab Results  Component Value Date   TSH 1.179 03/14/2016    .  Patient Active Problem List   Diagnosis Date Noted  . Dyspnea 09/22/2016  . OSA (obstructive sleep apnea) 08/31/2016  . PAF (paroxysmal atrial fibrillation) ([place]) 03/16/2016  . Hypothyroid 03/16/2016  . Obesity (BMI 30-39.9) 03/16/2016  . Atrial fibrillation ([place]) 01/05/2016  . Palpitations 11/10/2015  . Essential hypertension 11/10/2015  . Hyperlipidemia 11/10/2015    Past Medical History:  Diagnosis Date  . Atrial fibrillation ([place])   . Depression   . Diverticulitis   . Diverticulosis   . Esophageal ulcer   . GERD (gastroesophageal reflux disease)   . Hyperlipidemia   . Hypertension   . Hypothyroid   . OSA (obstructive sleep apnea) 08/31/2016    Past Surgical History:  Procedure Laterality Date  . CATARACT EXTRACTION    . HAND SURGERY    . KNEE SURGERY    .  ROTATOR CUFF REPAIR    . TOTAL ABDOMINAL HYSTERECTOMY      Social History  Substance Use Topics  . Smoking status: Never Smoker  . Smokeless tobacco: Never Used  . Alcohol use Yes     Comment: rare    Family History  Problem Relation Age of Onset  . Atrial fibrillation Mother   . Congestive Heart Failure Mother   . Heart disease Father   . Heart disease Brother   . Heart disease Brother     Allergies  Allergen Reactions  . Dextran Anaphylaxis  . Dextrans   . Dextromethorphan Hbr Other (See Comments)    hallucinations  . Sulfa Antibiotics   . Tree Extract   . Penicillins Rash  . Sulfacetamide Sodium Rash    Medication list has been reviewed and  updated.  Current Outpatient Prescriptions on File Prior to Visit  Medication Sig Dispense Refill  . amLODipine (NORVASC) 5 MG tablet Take 1 tablet (5 mg total) by mouth daily. 90 tablet 3  . atenolol (TENORMIN) 25 MG tablet Take 1 tablet (25 mg total) by mouth 2 (two) times daily. 180 tablet 3  . BIOTIN PO Take 1 tablet by mouth daily.    . Cholecalciferol (D 2000) 2000 units TABS Take 1 tablet by mouth daily.    . flecainide (TAMBOCOR) 100 MG tablet Take 1 tablet by mouth 2 (two) times daily.    [name] FLUoxetine (PROZAC) 40 MG capsule Take 40 mg by mouth 2 (two) times daily.     Javier Docker Oil 1000 MG CAPS Take 1 capsule by mouth daily.    [name] lactobacillus acidophilus (BACID) TABS tablet Take 1 tablet by mouth daily.    [name] levothyroxine (SYNTHROID, LEVOTHROID) 125 MCG tablet Take 1 tablet (125 mcg total) by mouth daily before breakfast. 90 tablet 1  . lovastatin (ALTOPREV) 40 MG 24 hr tablet Take 1 tablet (40 mg total) by mouth at bedtime. 30 tablet 0  . LUTEIN PO Take 1 capsule by mouth daily.    . magnesium 30 MG tablet Take 30 mg by mouth daily.    . pantoprazole (PROTONIX) 40 MG tablet Take 1 tablet (40 mg total) by mouth 2 (two) times daily. 60 tablet 3  . Potassium 75 MG TABS Take 1 tablet by mouth daily.    . rivaroxaban (XARELTO) 20 MG TABS tablet Take 1 tablet (20 mg total) by mouth daily with supper. 90 tablet 3  . traZODone (DESYREL) 50 MG tablet Take 50 mg by mouth at bedtime.    . Turmeric 450 MG CAPS Take 1 capsule by mouth daily.     No current facility-administered medications on file prior to visit.     Review of Systems:  As per HPI- otherwise negative.   Physical Examination: Vitals:   10/18/16 1119  BP: 137/82  Pulse: 60  Temp: 98.6 F (37 C)   Vitals:   10/18/16 1119  Weight: 215 lb 9.6 oz (97.8 kg)  Height: 5\' 1"  (1.549 m)   Body mass index is 40.74 kg/m. Ideal Body Weight: Weight in (lb) to have BMI = 25: 132  GEN: WDWN, NAD, Non-toxic, A & O x 3, obese,  looks well HEENT: Atraumatic, Normocephalic. Neck supple. No masses, No LAD. Bilateral TM wnl, oropharynx normal.  PEERL,EOMI.   Eyes are not injected or red. No pain with movement of eyes or gentle pressure on globe through lid Limited fundoscopic exam wnl Ears and Nose: No external deformity. CV: RRR, No  M/G/R. No JVD. No thrill. No extra heart sounds. PULM: CTA B, no wheezes, crackles, rhonchi. No retractions. No resp. distress. No accessory muscle use. EXTR: No c/c/e NEURO Normal gait.  PSYCH: Normally interactive. Conversant. Not depressed or anxious appearing.  Calm demeanor.    Assessment and Plan: Itchy eyes - Plan: montelukast (SINGULAIR) 10 MG tablet  Chronic cough - Plan: montelukast (SINGULAIR) 10 MG tablet  Palpitations - Plan: atenolol (TENORMIN) 25 MG tablet  Insomnia, unspecified type - Plan: traZODone (DESYREL) 50 MG tablet  Gastroesophageal reflux disease without esophagitis - Plan: pantoprazole (PROTONIX) 40 MG tablet  Hypothyroidism due to acquired atrophy of thyroid - Plan: levothyroxine (SYNTHROID, LEVOTHROID) 125 MCG tablet  Paroxysmal atrial fibrillation (HCC) - Plan: amLODipine (NORVASC) 5 MG tablet, flecainide (TAMBOCOR) 100 MG tablet, rivaroxaban (XARELTO) 20 MG TABS tablet  Dyslipidemia - Plan: lovastatin (ALTOPREV) 40 MG 24 hr tablet  Depression, major, single episode, mild (HCC) - Plan: FLUoxetine (PROZAC) 40 MG capsule  Here today with concern about her eyes.  Discussed with her retinal specialist who gave instructions to follow-up if not better.  However she does seem to be improving at this time so we hope she will continue to get better She has noted possible allergy sx so will try adding singulair as this may help with her eyes also Refilled her medications as above Her depression is under control with prozac   Signed Lamar Blinks, MD

## 2016-10-27 ENCOUNTER — Telehealth: Payer: Self-pay | Admitting: Family Medicine

## 2016-10-27 ENCOUNTER — Other Ambulatory Visit: Payer: Self-pay | Admitting: Emergency Medicine

## 2016-10-27 DIAGNOSIS — E785 Hyperlipidemia, unspecified: Secondary | ICD-10-CM

## 2016-10-27 MED ORDER — LOVASTATIN ER 40 MG PO TB24: 40.0000 mg | ORAL_TABLET | Freq: Every day | ORAL | 3 refills | Status: DC

## 2016-10-27 NOTE — Telephone Encounter (Signed)
[facility] [place] [place] [facility] [employer] [facility] [phone] (Phone) [phone] (Fax)    Reason for call:  As per mail order never received lovastatin (ALTOPREV) 40 MG 24 hr tablet, requesting Rx resent

## 2016-10-27 NOTE — Progress Notes (Signed)
Pre visit review using our clinic review tool, if applicable. No additional management support is needed unless otherwise documented below in the visit note. 

## 2016-10-27 NOTE — Telephone Encounter (Signed)
Rx re-sent as requested.

## 2016-10-27 NOTE — Telephone Encounter (Addendum)
noted 

## 2016-10-31 ENCOUNTER — Encounter: Payer: Self-pay | Admitting: Family Medicine

## 2016-11-02 ENCOUNTER — Encounter: Payer: Self-pay | Admitting: Family Medicine

## 2016-11-07 NOTE — Telephone Encounter (Signed)
[id number]  

## 2016-11-15 DIAGNOSIS — R399 Unspecified symptoms and signs involving the genitourinary system: Secondary | ICD-10-CM | POA: Diagnosis not present

## 2016-11-15 DIAGNOSIS — R300 Dysuria: Secondary | ICD-10-CM | POA: Diagnosis not present

## 2016-11-28 ENCOUNTER — Telehealth: Payer: Self-pay | Admitting: Family Medicine

## 2016-11-28 DIAGNOSIS — R002 Palpitations: Secondary | ICD-10-CM

## 2016-11-28 NOTE — Telephone Encounter (Signed)
Caller name: Nadean Corwin  Relation to pt: from [employer] back number: [id number] reference # [id number] Pharmacy: [place], [place] to [facility] [place] [facility] [phone] (Phone) [phone] (Fax)     Reason for call:  [employer] wanted to know if MG can be increase to 50 MG 1x daily instead of 25 MG tablet 2 daily, please advise regarding atenolol (TENORMIN), please advise

## 2016-11-29 MED ORDER — ATENOLOL 50 MG PO TABS: 50.0000 mg | ORAL_TABLET | Freq: Every day | ORAL | 3 refills | Status: DC

## 2016-11-29 NOTE — Telephone Encounter (Signed)
Called and LMOM for pt- [facility] regarding changing her atenolol to 50 mg once a day.  I will make this change for her- if she does NOT want to make this change please let me know

## 2016-12-15 ENCOUNTER — Encounter: Payer: Self-pay | Admitting: Medical

## 2016-12-15 ENCOUNTER — Ambulatory Visit (INDEPENDENT_AMBULATORY_CARE_PROVIDER_SITE_OTHER): Payer: Medicare Other | Admitting: Medical

## 2016-12-15 ENCOUNTER — Ambulatory Visit (HOSPITAL_BASED_OUTPATIENT_CLINIC_OR_DEPARTMENT_OTHER)
Admission: RE | Admit: 2016-12-15 | Discharge: 2016-12-15 | Disposition: A | Discharge status: Home / Self Care | Payer: Medicare Other | Source: Ambulatory Visit | Attending: Medical | Admitting: Medical

## 2016-12-15 VITALS — BP 131/58 | HR 63 | Temp 100.5°F | Resp 16 | Ht 61.0 in | Wt 216.0 lb

## 2016-12-15 DIAGNOSIS — R05 Cough: Secondary | ICD-10-CM | POA: Insufficient documentation

## 2016-12-15 DIAGNOSIS — R079 Chest pain, unspecified: Secondary | ICD-10-CM | POA: Diagnosis not present

## 2016-12-15 DIAGNOSIS — R059 Cough, unspecified: Secondary | ICD-10-CM

## 2016-12-15 DIAGNOSIS — J0100 Acute maxillary sinusitis, unspecified: Secondary | ICD-10-CM

## 2016-12-15 DIAGNOSIS — R062 Wheezing: Secondary | ICD-10-CM

## 2016-12-15 DIAGNOSIS — M940 Chondrocostal junction syndrome [Tietze]: Secondary | ICD-10-CM

## 2016-12-15 DIAGNOSIS — J40 Bronchitis, not specified as acute or chronic: Secondary | ICD-10-CM | POA: Diagnosis not present

## 2016-12-15 DIAGNOSIS — I700 Atherosclerosis of aorta: Secondary | ICD-10-CM | POA: Insufficient documentation

## 2016-12-15 DIAGNOSIS — G4700 Insomnia, unspecified: Secondary | ICD-10-CM

## 2016-12-15 MED ORDER — ALBUTEROL SULFATE HFA 108 (90 BASE) MCG/ACT IN AERS: 2.0000 | INHALATION_SPRAY | Freq: Four times a day (QID) | RESPIRATORY_TRACT | 0 refills | Status: DC | PRN

## 2016-12-15 MED ORDER — DOXYCYCLINE HYCLATE 100 MG PO TABS: 100.0000 mg | ORAL_TABLET | Freq: Two times a day (BID) | ORAL | 0 refills | Status: DC

## 2016-12-15 MED ORDER — FLUTICASONE PROPIONATE 50 MCG/ACT NA SUSP: 2.0000 | Freq: Every day | NASAL | 1 refills | Status: DC

## 2016-12-15 MED ORDER — TRAZODONE HCL 50 MG PO TABS: 50.0000 mg | ORAL_TABLET | Freq: Every day | ORAL | 0 refills | Status: DC

## 2016-12-15 MED ORDER — BENZONATATE 100 MG PO CAPS: 100.0000 mg | ORAL_CAPSULE | Freq: Three times a day (TID) | ORAL | 0 refills | Status: DC | PRN

## 2016-12-15 NOTE — Progress Notes (Signed)
Pre visit review using our clinic review tool, if applicable. No additional management support is needed unless otherwise documented below in the visit note. 

## 2016-12-15 NOTE — Patient Instructions (Addendum)
You appear to have a sinus and bronchitis infection. I am prescribing doxycycline antibiotic for the infection. To help with the nasal congestion I prescribed flonase nasal steroid. For your associated cough, I prescribed  Benzonatate cough medicine.  For costochondritis can use ibuprofen otc. If any increase in chest pain constant/cardiac type as explained  then ED evaluation.(pt expressed understanding)  For wheezing albuterol inhaler.   cxr today  Rest, hydrate, tylenol for fever.  Follow up in 7 days or as needed.  I have reviewed allergy list. With pharmacist and you have never been on cephalosporin so best option is doxycycline.

## 2016-12-15 NOTE — Progress Notes (Signed)
Subjective:    Patient ID: Lisa Acosta, female    DOB: [birth year], 66 y.o.   MRN: [id number]  HPI Pt in with some one week of coughing and nasal congestion. Pt states had severe cough day and night for one week. Today cough still present but not constant. Pt has had some chills. No fever or sweats. Does have a lot of sinus pressure and blowing out mucous from her nose.   Pt has very transient burn sensation costochondral junction  when she coughs.   Pt mentions just sold her house and staying  At extended stay. She notes will be there for another 3-4 weeks. She is beside laundry room and speculates somehthing in environment may be causing the cough. She is thinking of changing rooms.   Pt during her move accidentally backed her trazadone in storage. Movers have the bottle in storage.  Review of Systems  Constitutional: Negative for chills, fatigue and fever.  HENT: Positive for congestion, sinus pain and sinus pressure. Negative for ear discharge, ear pain and postnasal drip.   Respiratory: Positive for cough and wheezing. Negative for chest tightness and shortness of breath.        Some occasional wheezing but not constant.  Cardiovascular: Negative for chest pain and palpitations.  Gastrointestinal: Negative for abdominal pain.  Musculoskeletal: Negative for back pain and myalgias.  Skin: Negative for rash.  Neurological: Negative for dizziness, weakness, numbness and headaches.  Hematological: Negative for adenopathy. Does not bruise/bleed easily.  Psychiatric/Behavioral: Positive for sleep disturbance. Negative for behavioral problems, confusion and suicidal ideas. The patient is not nervous/anxious.     Past Medical History:  Diagnosis Date  . Atrial fibrillation ([place])   . Depression   . Diverticulitis   . Diverticulosis   . Esophageal ulcer   . GERD (gastroesophageal reflux disease)   . Hyperlipidemia   . Hypertension   . Hypothyroid   . OSA (obstructive sleep  apnea) 08/31/2016     Social History   Social History  . Marital status: Married    Spouse name: Jenny Reichmann  . Number of children: 1  . Years of education: College   Occupational History  . Admin Assist. Other    [employer]   Social History Main Topics  . Smoking status: Never Smoker  . Smokeless tobacco: Never Used  . Alcohol use Yes     Comment: rare  . Drug use: No  . Sexual activity: Not on file   Other Topics Concern  . Not on file   Social History Narrative   Patient lives at home spouse.   Caffeine use: 2 sodas weelky    Past Surgical History:  Procedure Laterality Date  . CATARACT EXTRACTION    . HAND SURGERY    . KNEE SURGERY    . ROTATOR CUFF REPAIR    . TOTAL ABDOMINAL HYSTERECTOMY      Family History  Problem Relation Age of Onset  . Atrial fibrillation Mother   . Congestive Heart Failure Mother   . Heart disease Father   . Heart disease Brother   . Heart disease Brother     Allergies  Allergen Reactions  . Dextran Anaphylaxis  . Dextrans   . Dextromethorphan Hbr Other (See Comments)    hallucinations  . Sulfa Antibiotics   . Tree Extract   . Penicillins Rash  . Sulfacetamide Sodium Rash    Current Outpatient Prescriptions on File Prior to Visit  Medication Sig Dispense Refill  .  amLODipine (NORVASC) 5 MG tablet Take 1 tablet (5 mg total) by mouth daily. 90 tablet 3  . atenolol (TENORMIN) 50 MG tablet Take 1 tablet (50 mg total) by mouth daily. 90 tablet 3  . BIOTIN PO Take 1 tablet by mouth daily.    . Cholecalciferol (D 2000) 2000 units TABS Take 1 tablet by mouth daily.    . flecainide (TAMBOCOR) 100 MG tablet Take 1 tablet (100 mg total) by mouth 2 (two) times daily. 180 tablet 3  . FLUoxetine (PROZAC) 40 MG capsule Take 1 capsule (40 mg total) by mouth 2 (two) times daily. 180 capsule 3  . Krill Oil 1000 MG CAPS Take 1 capsule by mouth daily.    [name] lactobacillus acidophilus (BACID) TABS tablet Take 1 tablet by mouth daily.      [name] levothyroxine (SYNTHROID, LEVOTHROID) 125 MCG tablet Take 1 tablet (125 mcg total) by mouth daily before breakfast. 90 tablet 3  . lovastatin (ALTOPREV) 40 MG 24 hr tablet Take 1 tablet (40 mg total) by mouth at bedtime. 90 tablet 3  . LUTEIN PO Take 1 capsule by mouth daily.    . magnesium 30 MG tablet Take 30 mg by mouth daily.    . pantoprazole (PROTONIX) 40 MG tablet Take 1 tablet (40 mg total) by mouth daily. 90 tablet 3  . Potassium 75 MG TABS Take 1 tablet by mouth daily.    . rivaroxaban (XARELTO) 20 MG TABS tablet Take 1 tablet (20 mg total) by mouth daily with supper. 90 tablet 3  . traZODone (DESYREL) 50 MG tablet Take 1 tablet (50 mg total) by mouth at bedtime. 90 tablet 3  . Turmeric 450 MG CAPS Take 1 capsule by mouth daily.     No current facility-administered medications on file prior to visit.     BP (!) 131/58 (BP Location: Left Arm, Patient Position: Sitting, Cuff Size: Large)   Pulse 63   Temp (!) 100.5 F (38.1 C) (Oral)   Resp 16   Ht 5\' 1"  (1.549 m)   Wt 216 lb (98 kg)   SpO2 93%   BMI 40.81 kg/m       Objective:   Physical Exam  General  Mental Status - Alert. General Appearance - Well groomed. Not in acute distress.  Skin Rashes- No Rashes.  HEENT Head- Normal. Ear Auditory Canal - Left- Normal. Right - Normal.Tympanic Membrane- Left- Normal. Right- Normal. Eye Sclera/Conjunctiva- Left- Normal. Right- Normal. Nose & Sinuses Nasal Mucosa- Left-  Boggy and Congested. Right-  Boggy and  Congested.Bilateral maxillary and frontal sinus pressure. Mouth & Throat Lips: Upper Lip- Normal: no dryness, cracking, pallor, cyanosis, or vesicular eruption. Lower Lip-Normal: no dryness, cracking, pallor, cyanosis or vesicular eruption. Buccal Mucosa- Bilateral- No Aphthous ulcers. Oropharynx- No Discharge or Erythema. Tonsils: Characteristics- Bilateral- No Erythema or Congestion. Size/Enlargement- Bilateral- No enlargement. Discharge-  bilateral-None.  Neck Neck- Supple. No Masses.   Chest and Lung Exam Auscultation: Breath Sounds:-Clear even and unlabored.  Cardiovascular Auscultation:Rythm- Regular, rate and rhythm. Murmurs & Other Heart Sounds:Ausculatation of the heart reveal- No Murmurs.  Lymphatic Head & Neck General Head & Neck Lymphatics: Bilateral: Description- No Localized lymphadenopathy.  Anterior thorax- brief transient costocohondral junction pain on breathing deep and easily reproducible on light palpation.       Assessment & Plan:  You appear to have a sinus and bronchitis infection. I am prescribing doxycycline antibiotic for the infection. To help with the nasal congestion I prescribed flonase nasal steroid.  For your associated cough, I prescribed  Benzonatate cough medicine.  For costochondritis can use ibuprofen otc. If any increase in chest pain constant cardiac type  as explained  then ED evaluation.(pt expressed understanding) For wheezing albuterol inhaler.   cxr today  Rest, hydrate, tylenol for fever.  Follow up in 7 days or as needed.

## 2016-12-26 ENCOUNTER — Encounter: Payer: Self-pay | Admitting: Family Medicine

## 2017-01-10 DIAGNOSIS — M150 Primary generalized (osteo)arthritis: Secondary | ICD-10-CM | POA: Diagnosis not present

## 2017-01-10 DIAGNOSIS — M2550 Pain in unspecified joint: Secondary | ICD-10-CM | POA: Diagnosis not present

## 2017-01-10 DIAGNOSIS — Z6841 Body Mass Index (BMI) 40.0 and over, adult: Secondary | ICD-10-CM | POA: Diagnosis not present

## 2017-01-10 DIAGNOSIS — Z8739 Personal history of other diseases of the musculoskeletal system and connective tissue: Secondary | ICD-10-CM | POA: Diagnosis not present

## 2017-01-10 DIAGNOSIS — M79641 Pain in right hand: Secondary | ICD-10-CM | POA: Diagnosis not present

## 2017-01-10 DIAGNOSIS — M79642 Pain in left hand: Secondary | ICD-10-CM | POA: Diagnosis not present

## 2017-01-11 ENCOUNTER — Other Ambulatory Visit: Payer: Self-pay | Admitting: Family Medicine

## 2017-01-11 DIAGNOSIS — G4700 Insomnia, unspecified: Secondary | ICD-10-CM

## 2017-01-11 NOTE — Telephone Encounter (Signed)
Refill request for traZODone (DESYREL) 50 MG tablet.

## 2017-01-12 DIAGNOSIS — H35371 Puckering of macula, right eye: Secondary | ICD-10-CM | POA: Diagnosis not present

## 2017-01-12 DIAGNOSIS — H43813 Vitreous degeneration, bilateral: Secondary | ICD-10-CM | POA: Diagnosis not present

## 2017-01-12 DIAGNOSIS — H3582 Retinal ischemia: Secondary | ICD-10-CM | POA: Diagnosis not present

## 2017-01-12 DIAGNOSIS — H34831 Tributary (branch) retinal vein occlusion, right eye, with macular edema: Secondary | ICD-10-CM | POA: Diagnosis not present

## 2017-01-22 ENCOUNTER — Ambulatory Visit (INDEPENDENT_AMBULATORY_CARE_PROVIDER_SITE_OTHER): Payer: Medicare Other | Admitting: Family Medicine

## 2017-01-22 VITALS — BP 118/80 | HR 56 | Temp 98.5°F | Ht 61.0 in | Wt 216.2 lb

## 2017-01-22 DIAGNOSIS — R7309 Other abnormal glucose: Secondary | ICD-10-CM | POA: Diagnosis not present

## 2017-01-22 DIAGNOSIS — Z131 Encounter for screening for diabetes mellitus: Secondary | ICD-10-CM | POA: Diagnosis not present

## 2017-01-22 DIAGNOSIS — F320 Major depressive disorder, single episode, mild: Secondary | ICD-10-CM | POA: Diagnosis not present

## 2017-01-22 DIAGNOSIS — Z1159 Encounter for screening for other viral diseases: Secondary | ICD-10-CM | POA: Diagnosis not present

## 2017-01-22 DIAGNOSIS — M19041 Primary osteoarthritis, right hand: Secondary | ICD-10-CM

## 2017-01-22 DIAGNOSIS — Z0000 Encounter for general adult medical examination without abnormal findings: Secondary | ICD-10-CM

## 2017-01-22 DIAGNOSIS — I480 Paroxysmal atrial fibrillation: Secondary | ICD-10-CM

## 2017-01-22 DIAGNOSIS — I10 Essential (primary) hypertension: Secondary | ICD-10-CM

## 2017-01-22 DIAGNOSIS — M19042 Primary osteoarthritis, left hand: Secondary | ICD-10-CM | POA: Diagnosis not present

## 2017-01-22 DIAGNOSIS — Z79899 Other long term (current) drug therapy: Secondary | ICD-10-CM

## 2017-01-22 DIAGNOSIS — Z9989 Dependence on other enabling machines and devices: Secondary | ICD-10-CM

## 2017-01-22 DIAGNOSIS — E785 Hyperlipidemia, unspecified: Secondary | ICD-10-CM

## 2017-01-22 DIAGNOSIS — G4700 Insomnia, unspecified: Secondary | ICD-10-CM

## 2017-01-22 DIAGNOSIS — G4733 Obstructive sleep apnea (adult) (pediatric): Secondary | ICD-10-CM

## 2017-01-22 MED ORDER — TRAZODONE HCL 50 MG PO TABS: 50.0000 mg | ORAL_TABLET | Freq: Every day | ORAL | 3 refills | Status: DC

## 2017-01-22 NOTE — Progress Notes (Signed)
Patient states her Prozac does not seem to be helping her at this time.

## 2017-01-22 NOTE — Patient Instructions (Signed)
It was good to see you today as always- take care and I will be in touch with your labs We will also refer you to PT/ hand therapy to help [place] with your hand function and pain. You can take tramadol up to 100 mg every 6 hours for pain- I would not suggest that you use this much, but if you needed to take 2 pills in the am or take 1 every 8 hours for you hand pain that would be ok  Let me know if/ when you would like to consult with a hand surgeon for your arthritis Best of luck with your housing difficulties- I hope that all is worked out soon

## 2017-01-22 NOTE — Progress Notes (Addendum)
[place] at [facility] [address] [place], [place] [address] [phone]  Date:  01/22/2017   Name:  Lisa Acosta   DOB:  1951-03-04   MRN:  [id number]  PCP:  Darreld Mclean, MD    Chief Complaint: Medicare Wellness   History of Present Illness:  Lisa Acosta is a 66 y.o. very pleasant female patient who presents with the following:  History of HTN, hyperlipidemia, hypothyroidism, OSA, obesity. Here today with concern of needing an annual wellness exam  She had labs in July 2017 She has been under a lot of stress since January- she and her husband sold their house, they plan to move in with her daughter's family to save money but this has been harder than they thought it would be.  She will be forced to give up work due to pain in her hands. She has bad arthritis and cannot do most tasks with her hands. She has been to see rheumatology- she does not have RA, but does have severe OA.  They tried her on some tramadol which did help some.  It makes her feel a bit sleepy, does not get rid of the pain but it does take the edge off.   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    Patient Active Problem List   Diagnosis Date Noted  . Dyspnea 09/22/2016  . OSA (obstructive sleep apnea) 08/31/2016  . PAF (paroxysmal atrial fibrillation) ([place]) 03/16/2016  . Hypothyroid 03/16/2016  . Obesity (BMI 30-39.9) 03/16/2016  . Atrial fibrillation ([place]) 01/05/2016  . Palpitations 11/10/2015  . Essential hypertension 11/10/2015  . Hyperlipidemia 11/10/2015    Past Medical History:  Diagnosis Date  . Atrial fibrillation ([place])   . Depression   .  Diverticulitis   . Diverticulosis   . Esophageal ulcer   . GERD (gastroesophageal reflux disease)   . Hyperlipidemia   . Hypertension   . Hypothyroid   . OSA (obstructive sleep apnea) 08/31/2016    Past Surgical History:  Procedure Laterality Date  . CATARACT EXTRACTION    . HAND SURGERY    . KNEE SURGERY    . ROTATOR CUFF REPAIR    . TOTAL ABDOMINAL HYSTERECTOMY      Social History  Substance Use Topics  . Smoking status: Never Smoker  . Smokeless tobacco: Never Used  . Alcohol use Yes     Comment: rare    Family History  Problem Relation Age of Onset  . Atrial fibrillation Mother   . Congestive Heart Failure Mother   . Heart disease Father   . Heart disease Brother   . Heart disease Brother     Allergies  Allergen Reactions  . Dextran Anaphylaxis  . Dextrans   . Dextromethorphan Hbr Other (See Comments)    hallucinations  . Sulfa Antibiotics   . Tree Extract   . Penicillins Rash  . Sulfacetamide Sodium Rash    Medication list has been reviewed and updated.  Current Outpatient Prescriptions on File Prior to Visit  Medication Sig Dispense Refill  . amLODipine (NORVASC) 5 MG tablet Take  1 tablet (5 mg total) by mouth daily. 90 tablet 3  . atenolol (TENORMIN) 50 MG tablet Take 1 tablet (50 mg total) by mouth daily. 90 tablet 3  . benzonatate (TESSALON) 100 MG capsule Take 1 capsule (100 mg total) by mouth 3 (three) times daily as needed for cough. 21 capsule 0  . BIOTIN PO Take 1 tablet by mouth daily.    . Cholecalciferol (D 2000) 2000 units TABS Take 1 tablet by mouth daily.    . flecainide (TAMBOCOR) 100 MG tablet Take 1 tablet (100 mg total) by mouth 2 (two) times daily. 180 tablet 3  . FLUoxetine (PROZAC) 40 MG capsule Take 1 capsule (40 mg total) by mouth 2 (two) times daily. 180 capsule 3  . fluticasone (FLONASE) 50 MCG/ACT nasal spray Place 2 sprays into both nostrils daily. 16 g 1  . Krill Oil 1000 MG CAPS Take 1 capsule by mouth daily.    [name]  lactobacillus acidophilus (BACID) TABS tablet Take 1 tablet by mouth daily.    [name] levothyroxine (SYNTHROID, LEVOTHROID) 125 MCG tablet Take 1 tablet (125 mcg total) by mouth daily before breakfast. 90 tablet 3  . lovastatin (ALTOPREV) 40 MG 24 hr tablet Take 1 tablet (40 mg total) by mouth at bedtime. 90 tablet 3  . LUTEIN PO Take 1 capsule by mouth daily.    . magnesium 30 MG tablet Take 30 mg by mouth daily.    . pantoprazole (PROTONIX) 40 MG tablet Take 1 tablet (40 mg total) by mouth daily. 90 tablet 3  . Potassium 75 MG TABS Take 1 tablet by mouth daily.    . rivaroxaban (XARELTO) 20 MG TABS tablet Take 1 tablet (20 mg total) by mouth daily with supper. 90 tablet 3  . Turmeric 450 MG CAPS Take 1 capsule by mouth daily.    [name] albuterol (PROVENTIL HFA;VENTOLIN HFA) 108 (90 Base) MCG/ACT inhaler Inhale 2 puffs into the lungs every 6 (six) hours as needed for wheezing or shortness of breath. (Patient not taking: Reported on 01/22/2017) 1 Inhaler 0   No current facility-administered medications on file prior to visit.     Review of Systems:  As per HPI- otherwise negative.  No fever, chills, CP, SOB, vaginal bleeding, concerning moles, or SI   Physical Examination: Vitals:   01/22/17 1504  BP: 118/80  Pulse: (!) 56  Temp: 98.5 F (36.9 C)   Vitals:   01/22/17 1504  Weight: 216 lb 3.2 oz (98.1 kg)  Height: 5\' 1"  (1.549 m)   Body mass index is 40.85 kg/m. Ideal Body Weight: Weight in (lb) to have BMI = 25: 132  GEN: WDWN, NAD, Non-toxic, A & O x 3, obese, looks well HEENT: Atraumatic, Normocephalic. Neck supple. No masses, No LAD.  Bilateral TM wnl, oropharynx normal.  PEERL,EOMI.   Ears and Nose: No external deformity. CV: RRR, No M/G/R. No JVD. No thrill. No extra heart sounds. PULM: CTA B, no wheezes, crackles, rhonchi. No retractions. No resp. distress. No accessory muscle use. ABD: S, NT, ND. No rebound. No HSM. EXTR: No c/c/e  She has hypertrophy and tenderness of most of  her hand joints, some worse than others.  She has significantly decreased grip strength  NEURO Normal gait.  PSYCH: Normally interactive. Conversant. Not depressed or anxious appearing.  Calm demeanor.    Assessment and Plan: Physical exam  Insomnia, unspecified type - Plan: traZODone (DESYREL) 50 MG tablet  Dyslipidemia - Plan: Comprehensive metabolic panel, Lipid panel  Depression, major, single episode, mild (HCC)  Medication management - Plan: CBC, Comprehensive metabolic panel  Primary osteoarthritis of both hands - Plan: Ambulatory referral to Physical Therapy  OSA on CPAP  Essential hypertension  Paroxysmal atrial fibrillation (HCC) - Plan: TSH  Screening for diabetes mellitus - Plan: Hemoglobin A1c  Encounter for hepatitis C screening test for low risk patient - Plan: Hepatitis C antibody  Elevated glucose - Plan: Hemoglobin A1c  Here today for a CPE and labs Discussed tramadol use for her hand pain, and other strategies which may be helpful for her hands.  Will refer to PT to try to improve her hand function and strength  Signed Lamar Blinks, MD  Results for orders placed or performed in visit on 01/22/17  CBC  Result Value Ref Range   WBC 7.6 4.0 - 10.5 K/uL   RBC 4.56 3.87 - 5.11 Mil/uL   Platelets 249.0 150.0 - 400.0 K/uL   Hemoglobin 13.3 12.0 - 15.0 g/dL   HCT 40.3 36.0 - 46.0 %   MCV 88.4 78.0 - 100.0 fl   MCHC 33.1 30.0 - 36.0 g/dL   RDW 13.4 11.5 - 15.5 %  Comprehensive metabolic panel  Result Value Ref Range   Sodium 139 135 - 145 mEq/L   Potassium 3.9 3.5 - 5.1 mEq/L   Chloride 103 96 - 112 mEq/L   CO2 30 19 - 32 mEq/L   Glucose, Bld 116 (H) 70 - 99 mg/dL   BUN 20 6 - 23 mg/dL   Creatinine, Ser 0.93 0.40 - 1.20 mg/dL   Total Bilirubin 0.6 0.2 - 1.2 mg/dL   Alkaline Phosphatase 97 39 - 117 U/L   AST 19 0 - 37 U/L   ALT 18 0 - 35 U/L   Total Protein 6.9 6.0 - 8.3 g/dL   Albumin 4.1 3.5 - 5.2 g/dL   Calcium 9.4 8.4 - 10.5 mg/dL   GFR  63.96 >60.00 mL/min  Hemoglobin A1c  Result Value Ref Range   Hgb A1c MFr Bld 6.0 4.6 - 6.5 %  Lipid panel  Result Value Ref Range   Cholesterol 192 0 - 200 mg/dL   Triglycerides 207.0 (H) 0.0 - 149.0 mg/dL   HDL 55.90 >39.00 mg/dL   VLDL 41.4 (H) 0.0 - 40.0 mg/dL   Total CHOL/HDL Ratio 3    NonHDL 135.74   TSH  Result Value Ref Range   TSH 2.62 0.35 - 4.50 uIU/mL  LDL cholesterol, direct  Result Value Ref Range   Direct LDL 112.0 mg/dL

## 2017-01-23 ENCOUNTER — Encounter: Payer: Self-pay | Admitting: Family Medicine

## 2017-01-23 LAB — TSH: TSH: 2.62 u[IU]/mL (ref 0.35–4.50)

## 2017-01-23 LAB — CBC
HCT: 40.3 % (ref 36.0–46.0)
Hemoglobin: 13.3 g/dL (ref 12.0–15.0)
MCHC: 33.1 g/dL (ref 30.0–36.0)
MCV: 88.4 fl (ref 78.0–100.0)
Platelets: 249 10*3/uL (ref 150.0–400.0)
RBC: 4.56 Mil/uL (ref 3.87–5.11)
RDW: 13.4 % (ref 11.5–15.5)
WBC: 7.6 10*3/uL (ref 4.0–10.5)

## 2017-01-23 LAB — COMPREHENSIVE METABOLIC PANEL
ALT: 18 U/L (ref 0–35)
AST: 19 U/L (ref 0–37)
Albumin: 4.1 g/dL (ref 3.5–5.2)
Alkaline Phosphatase: 97 U/L (ref 39–117)
BUN: 20 mg/dL (ref 6–23)
CO2: 30 mEq/L (ref 19–32)
Calcium: 9.4 mg/dL (ref 8.4–10.5)
Chloride: 103 mEq/L (ref 96–112)
Creatinine, Ser: 0.93 mg/dL (ref 0.40–1.20)
GFR: 63.96 mL/min (ref >60.00)
Glucose, Bld: 116 mg/dL — ABNORMAL HIGH (ref 70–99)
Potassium: 3.9 mEq/L (ref 3.5–5.1)
Sodium: 139 mEq/L (ref 135–145)
Total Bilirubin: 0.6 mg/dL (ref 0.2–1.2)
Total Protein: 6.9 g/dL (ref 6.0–8.3)

## 2017-01-23 LAB — LIPID PANEL
Cholesterol: 192 mg/dL (ref 0–200)
HDL: 55.9 mg/dL (ref >39.00)
NonHDL: 135.74
Total CHOL/HDL Ratio: 3
Triglycerides: 207 mg/dL — ABNORMAL HIGH (ref 0.0–149.0)
VLDL: 41.4 mg/dL — ABNORMAL HIGH (ref 0.0–40.0)

## 2017-01-23 LAB — HEPATITIS C ANTIBODY: HCV Ab: NEGATIVE

## 2017-01-23 LAB — HEMOGLOBIN A1C: Hgb A1c MFr Bld: 6 % (ref 4.6–6.5)

## 2017-01-23 LAB — LDL CHOLESTEROL, DIRECT: Direct LDL: 112 mg/dL

## 2017-01-26 ENCOUNTER — Other Ambulatory Visit: Payer: Self-pay | Admitting: Family Medicine

## 2017-01-26 DIAGNOSIS — M79641 Pain in right hand: Secondary | ICD-10-CM

## 2017-01-26 DIAGNOSIS — M79642 Pain in left hand: Secondary | ICD-10-CM

## 2017-01-26 DIAGNOSIS — M25641 Stiffness of right hand, not elsewhere classified: Secondary | ICD-10-CM

## 2017-01-26 DIAGNOSIS — M25642 Stiffness of left hand, not elsewhere classified: Secondary | ICD-10-CM

## 2017-01-31 DIAGNOSIS — M6281 Muscle weakness (generalized): Secondary | ICD-10-CM | POA: Diagnosis not present

## 2017-01-31 DIAGNOSIS — M1990 Unspecified osteoarthritis, unspecified site: Secondary | ICD-10-CM | POA: Diagnosis not present

## 2017-01-31 DIAGNOSIS — M25542 Pain in joints of left hand: Secondary | ICD-10-CM | POA: Diagnosis not present

## 2017-01-31 DIAGNOSIS — M25541 Pain in joints of right hand: Secondary | ICD-10-CM | POA: Diagnosis not present

## 2017-02-01 DIAGNOSIS — M25542 Pain in joints of left hand: Secondary | ICD-10-CM | POA: Diagnosis not present

## 2017-02-01 DIAGNOSIS — M1990 Unspecified osteoarthritis, unspecified site: Secondary | ICD-10-CM | POA: Diagnosis not present

## 2017-02-01 DIAGNOSIS — M6281 Muscle weakness (generalized): Secondary | ICD-10-CM | POA: Diagnosis not present

## 2017-02-01 DIAGNOSIS — M25541 Pain in joints of right hand: Secondary | ICD-10-CM | POA: Diagnosis not present

## 2017-02-06 ENCOUNTER — Telehealth: Payer: Self-pay | Admitting: Family Medicine

## 2017-02-06 NOTE — Telephone Encounter (Signed)
Pt brought FMLA forms and pt typed a letter to Dr Lorelei Pont requesting detail of work restrictions. Placed letter and forms in front office tray.

## 2017-02-07 ENCOUNTER — Encounter: Payer: Self-pay | Admitting: Family Medicine

## 2017-02-07 NOTE — Telephone Encounter (Signed)
Called pt- forms are ready for her to pick up

## 2017-02-12 ENCOUNTER — Encounter: Payer: Self-pay | Admitting: Family Medicine

## 2017-02-14 NOTE — Telephone Encounter (Signed)
Received incomplete Medical Certification from [facility] requesting more information under Leave Type/Duration/Frequency; forwarded to provider/SLS 06/06

## 2017-02-16 ENCOUNTER — Encounter: Payer: Self-pay | Admitting: Family Medicine

## 2017-02-20 DIAGNOSIS — M1990 Unspecified osteoarthritis, unspecified site: Secondary | ICD-10-CM | POA: Diagnosis not present

## 2017-02-20 DIAGNOSIS — M6281 Muscle weakness (generalized): Secondary | ICD-10-CM | POA: Diagnosis not present

## 2017-02-20 DIAGNOSIS — M25542 Pain in joints of left hand: Secondary | ICD-10-CM | POA: Diagnosis not present

## 2017-02-20 DIAGNOSIS — M25541 Pain in joints of right hand: Secondary | ICD-10-CM | POA: Diagnosis not present

## 2017-02-21 ENCOUNTER — Encounter: Payer: Self-pay | Admitting: Family Medicine

## 2017-02-22 DIAGNOSIS — M1990 Unspecified osteoarthritis, unspecified site: Secondary | ICD-10-CM | POA: Diagnosis not present

## 2017-02-22 DIAGNOSIS — M6281 Muscle weakness (generalized): Secondary | ICD-10-CM | POA: Diagnosis not present

## 2017-02-22 DIAGNOSIS — M25542 Pain in joints of left hand: Secondary | ICD-10-CM | POA: Diagnosis not present

## 2017-02-22 DIAGNOSIS — M25541 Pain in joints of right hand: Secondary | ICD-10-CM | POA: Diagnosis not present

## 2017-02-26 ENCOUNTER — Encounter: Payer: Self-pay | Admitting: Family Medicine

## 2017-02-26 ENCOUNTER — Ambulatory Visit (INDEPENDENT_AMBULATORY_CARE_PROVIDER_SITE_OTHER): Payer: Medicare Other | Admitting: Family Medicine

## 2017-02-26 VITALS — BP 110/80 | HR 61 | Temp 98.9°F | Resp 16 | Wt 212.4 lb

## 2017-02-26 DIAGNOSIS — M79642 Pain in left hand: Secondary | ICD-10-CM

## 2017-02-26 DIAGNOSIS — M79641 Pain in right hand: Secondary | ICD-10-CM

## 2017-02-26 DIAGNOSIS — M19042 Primary osteoarthritis, left hand: Secondary | ICD-10-CM | POA: Diagnosis not present

## 2017-02-26 DIAGNOSIS — M19041 Primary osteoarthritis, right hand: Secondary | ICD-10-CM | POA: Diagnosis not present

## 2017-02-26 NOTE — Progress Notes (Signed)
[place] at [facility] [address], [place], [place] [address] [phone]  Date:  02/26/2017   Name:  Lisa Acosta   DOB:  Feb 27, 1951   MRN:  [id number]  PCP:  Darreld Mclean, MD    Chief Complaint: Hand Pain (Pt here for disability paper )   History of Present Illness:  Lisa Acosta is a 66 y.o. very pleasant female patient who presents with the following: Here today to discuss severe OA of both hands.  She had rheumatology eval and does not have RA She is stopping work and applying for disability.  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   She does feel like her mobility is better although the therapy sessions do leave her very sore  She last day of work will be this Friday - she will not be going back to work. She is applying for disability- they hope she will be able to get benefits for another 18 months or so She was advised that she will need a note stating the date of her last day of work, and that she will be OOW permanently per her disability insurance company.   She noted a couple of brief episodes of a fib over the last several weeks. These did not last long, and she converted back to NSR.  She is still on her medications for same including atenolol, flecainide, xarelto- she will see her cardiologist in the next few weeks BP Readings from Last 3 Encounters:  02/26/17 (!) 129/46  01/22/17 118/80  12/15/16 (!) 131/58   Repeat BP today 110/80  Patient Active Problem List   Diagnosis Date Noted  . Dyspnea 09/22/2016  . OSA (obstructive sleep apnea) 08/31/2016  . PAF (paroxysmal atrial fibrillation) ([place]) 03/16/2016  . Hypothyroid 03/16/2016  . Obesity (BMI 30-39.9) 03/16/2016  . Atrial fibrillation ([place]) 01/05/2016  . Palpitations 11/10/2015  .  Essential hypertension 11/10/2015  . Hyperlipidemia 11/10/2015    Past Medical History:  Diagnosis Date  . Atrial fibrillation ([place])   . Depression   . Diverticulitis   . Diverticulosis   . Esophageal ulcer   . GERD (gastroesophageal reflux disease)   . Hyperlipidemia   . Hypertension   . Hypothyroid   . OSA (obstructive sleep apnea) 08/31/2016    Past Surgical History:  Procedure Laterality Date  . CATARACT EXTRACTION    . HAND SURGERY    . KNEE SURGERY    . ROTATOR CUFF REPAIR    . TOTAL ABDOMINAL HYSTERECTOMY      Social History  Substance Use Topics  . Smoking status: Never Smoker  . Smokeless tobacco: Never Used  . Alcohol use Yes     Comment: rare    Family History  Problem Relation Age of Onset  . Atrial fibrillation Mother   . Congestive Heart Failure Mother   . Heart disease Father   . Heart disease Brother   . Heart disease Brother     Allergies  Allergen Reactions  . Dextran Anaphylaxis  . Dextrans   . Dextromethorphan Hbr Other (See Comments)    hallucinations  . Sulfa Antibiotics   . Tree Extract   . Penicillins Rash  . Sulfacetamide Sodium Rash    Medication list has  been reviewed and updated.  Current Outpatient Prescriptions on File Prior to Visit  Medication Sig Dispense Refill  . amLODipine (NORVASC) 5 MG tablet Take 1 tablet (5 mg total) by mouth daily. 90 tablet 3  . atenolol (TENORMIN) 50 MG tablet Take 1 tablet (50 mg total) by mouth daily. 90 tablet 3  . BIOTIN PO Take 1 tablet by mouth daily.    . Cholecalciferol (D 2000) 2000 units TABS Take 1 tablet by mouth daily.    . flecainide (TAMBOCOR) 100 MG tablet Take 1 tablet (100 mg total) by mouth 2 (two) times daily. 180 tablet 3  . FLUoxetine (PROZAC) 40 MG capsule Take 1 capsule (40 mg total) by mouth 2 (two) times daily. 180 capsule 3  . fluticasone (FLONASE) 50 MCG/ACT nasal spray Place 2 sprays into both nostrils daily. 16 g 1  . Krill Oil 1000 MG CAPS Take 1 capsule by  mouth daily.    [name] lactobacillus acidophilus (BACID) TABS tablet Take 1 tablet by mouth daily.    [name] levothyroxine (SYNTHROID, LEVOTHROID) 125 MCG tablet Take 1 tablet (125 mcg total) by mouth daily before breakfast. 90 tablet 3  . lovastatin (ALTOPREV) 40 MG 24 hr tablet Take 1 tablet (40 mg total) by mouth at bedtime. 90 tablet 3  . LUTEIN PO Take 1 capsule by mouth daily.    . magnesium 30 MG tablet Take 30 mg by mouth daily.    . pantoprazole (PROTONIX) 40 MG tablet Take 1 tablet (40 mg total) by mouth daily. 90 tablet 3  . Potassium 75 MG TABS Take 1 tablet by mouth daily.    . rivaroxaban (XARELTO) 20 MG TABS tablet Take 1 tablet (20 mg total) by mouth daily with supper. 90 tablet 3  . traZODone (DESYREL) 50 MG tablet Take 1 tablet (50 mg total) by mouth at bedtime. 90 tablet 3  . Turmeric 450 MG CAPS Take 1 capsule by mouth daily.     No current facility-administered medications on file prior to visit.     Review of Systems:  As per HPI- otherwise negative. No fever, chills, CP, SOB, or rash  Physical Examination: Vitals:   02/26/17 1140  BP: (!) 129/46  Pulse: 61  Resp: 16  Temp: 98.9 F (37.2 C)   Vitals:   02/26/17 1140  Weight: 212 lb 6.4 oz (96.3 kg)   Body mass index is 40.13 kg/m. Ideal Body Weight:    GEN: WDWN, NAD, Non-toxic, A & O x 3, obese, otherwise looks well HEENT: Atraumatic, Normocephalic. Neck supple. No masses, No LAD. Ears and Nose: No external deformity. CV: RRR, No M/G/R. No JVD. No thrill. No extra heart sounds. PULM: CTA B, no wheezes, crackles, rhonchi. No retractions. No resp. distress. No accessory muscle use. EXTR: No c/c/e NEURO Normal gait.  PSYCH: Normally interactive. Conversant. Not depressed or anxious appearing.  Calm demeanor.  In SR currently Both hands display diffuse thickening and stiffness of the IP joints with ulnar deviation of several fingers. She is not able to make a closed fist and her hands are painful to  palpation  Assessment and Plan: Primary osteoarthritis of both hands  Pain in both hands  Here today to follow-up on her OA.  She has severe OA of both hands which is preventing her from doing her job.  She is planning to stop work and is applying for disability benefits.  I support her claim and am happy to help in any way that I can  Signed Lamar Blinks, MD

## 2017-02-26 NOTE — Patient Instructions (Signed)
It was a pleasure to see you today- let me know if you need anything further as far as your disability process. Take care!

## 2017-02-27 DIAGNOSIS — M1990 Unspecified osteoarthritis, unspecified site: Secondary | ICD-10-CM | POA: Diagnosis not present

## 2017-02-27 DIAGNOSIS — M6281 Muscle weakness (generalized): Secondary | ICD-10-CM | POA: Diagnosis not present

## 2017-02-27 DIAGNOSIS — M25541 Pain in joints of right hand: Secondary | ICD-10-CM | POA: Diagnosis not present

## 2017-02-27 DIAGNOSIS — M25542 Pain in joints of left hand: Secondary | ICD-10-CM | POA: Diagnosis not present

## 2017-03-01 DIAGNOSIS — M6281 Muscle weakness (generalized): Secondary | ICD-10-CM | POA: Diagnosis not present

## 2017-03-01 DIAGNOSIS — M25542 Pain in joints of left hand: Secondary | ICD-10-CM | POA: Diagnosis not present

## 2017-03-01 DIAGNOSIS — M1990 Unspecified osteoarthritis, unspecified site: Secondary | ICD-10-CM | POA: Diagnosis not present

## 2017-03-01 DIAGNOSIS — M25541 Pain in joints of right hand: Secondary | ICD-10-CM | POA: Diagnosis not present

## 2017-03-02 ENCOUNTER — Telehealth: Payer: Self-pay | Admitting: *Deleted

## 2017-03-02 NOTE — Telephone Encounter (Signed)
Received [place]; forwarded to provider/SLS 06/22

## 2017-03-05 DIAGNOSIS — M1990 Unspecified osteoarthritis, unspecified site: Secondary | ICD-10-CM | POA: Diagnosis not present

## 2017-03-05 DIAGNOSIS — M25541 Pain in joints of right hand: Secondary | ICD-10-CM | POA: Diagnosis not present

## 2017-03-05 DIAGNOSIS — M6281 Muscle weakness (generalized): Secondary | ICD-10-CM | POA: Diagnosis not present

## 2017-03-05 DIAGNOSIS — M25542 Pain in joints of left hand: Secondary | ICD-10-CM | POA: Diagnosis not present

## 2017-03-06 ENCOUNTER — Telehealth: Payer: Self-pay | Admitting: *Deleted

## 2017-03-06 NOTE — Telephone Encounter (Signed)
Received Disability paperwork request for notes, including Imaging, Labs, Medications from 02/09/17 through present; only one visit to send, faxed to insurance at [phone] for Claim # [id number], forwarded to provider/SLS 06/26

## 2017-03-13 ENCOUNTER — Telehealth: Payer: Self-pay | Admitting: Internal Medicine

## 2017-03-13 DIAGNOSIS — M1990 Unspecified osteoarthritis, unspecified site: Secondary | ICD-10-CM | POA: Diagnosis not present

## 2017-03-13 DIAGNOSIS — M25541 Pain in joints of right hand: Secondary | ICD-10-CM | POA: Diagnosis not present

## 2017-03-13 DIAGNOSIS — M6281 Muscle weakness (generalized): Secondary | ICD-10-CM | POA: Diagnosis not present

## 2017-03-13 DIAGNOSIS — M25542 Pain in joints of left hand: Secondary | ICD-10-CM | POA: Diagnosis not present

## 2017-03-13 NOTE — Telephone Encounter (Signed)
Patient by mistake took 5 mg amlodipine tablet at night which she usually takes in the morning. I told her that she should be okay. However if she feels dizzy or lightheaded she may call back.

## 2017-03-19 DIAGNOSIS — M6281 Muscle weakness (generalized): Secondary | ICD-10-CM | POA: Diagnosis not present

## 2017-03-19 DIAGNOSIS — M25541 Pain in joints of right hand: Secondary | ICD-10-CM | POA: Diagnosis not present

## 2017-03-19 DIAGNOSIS — M25542 Pain in joints of left hand: Secondary | ICD-10-CM | POA: Diagnosis not present

## 2017-03-19 DIAGNOSIS — M1990 Unspecified osteoarthritis, unspecified site: Secondary | ICD-10-CM | POA: Diagnosis not present

## 2017-03-20 ENCOUNTER — Encounter: Payer: Self-pay | Admitting: Family Medicine

## 2017-03-21 ENCOUNTER — Telehealth: Payer: Self-pay | Admitting: *Deleted

## 2017-03-21 DIAGNOSIS — M25541 Pain in joints of right hand: Secondary | ICD-10-CM | POA: Diagnosis not present

## 2017-03-21 DIAGNOSIS — M6281 Muscle weakness (generalized): Secondary | ICD-10-CM | POA: Diagnosis not present

## 2017-03-21 DIAGNOSIS — M1990 Unspecified osteoarthritis, unspecified site: Secondary | ICD-10-CM | POA: Diagnosis not present

## 2017-03-21 DIAGNOSIS — M25542 Pain in joints of left hand: Secondary | ICD-10-CM | POA: Diagnosis not present

## 2017-03-21 NOTE — Telephone Encounter (Signed)
Received OT Plan of Care; forwarded to provider/SLS 07/11

## 2017-03-26 DIAGNOSIS — M25542 Pain in joints of left hand: Secondary | ICD-10-CM | POA: Diagnosis not present

## 2017-03-26 DIAGNOSIS — M6281 Muscle weakness (generalized): Secondary | ICD-10-CM | POA: Diagnosis not present

## 2017-03-26 DIAGNOSIS — M1990 Unspecified osteoarthritis, unspecified site: Secondary | ICD-10-CM | POA: Diagnosis not present

## 2017-03-26 DIAGNOSIS — M25541 Pain in joints of right hand: Secondary | ICD-10-CM | POA: Diagnosis not present

## 2017-04-02 DIAGNOSIS — M1990 Unspecified osteoarthritis, unspecified site: Secondary | ICD-10-CM | POA: Diagnosis not present

## 2017-04-02 DIAGNOSIS — M6281 Muscle weakness (generalized): Secondary | ICD-10-CM | POA: Diagnosis not present

## 2017-04-02 DIAGNOSIS — M25541 Pain in joints of right hand: Secondary | ICD-10-CM | POA: Diagnosis not present

## 2017-04-02 DIAGNOSIS — M25542 Pain in joints of left hand: Secondary | ICD-10-CM | POA: Diagnosis not present

## 2017-04-04 DIAGNOSIS — M6281 Muscle weakness (generalized): Secondary | ICD-10-CM | POA: Diagnosis not present

## 2017-04-04 DIAGNOSIS — M25541 Pain in joints of right hand: Secondary | ICD-10-CM | POA: Diagnosis not present

## 2017-04-04 DIAGNOSIS — M25542 Pain in joints of left hand: Secondary | ICD-10-CM | POA: Diagnosis not present

## 2017-04-04 DIAGNOSIS — M1990 Unspecified osteoarthritis, unspecified site: Secondary | ICD-10-CM | POA: Diagnosis not present

## 2017-04-11 ENCOUNTER — Encounter: Payer: Self-pay | Admitting: Family Medicine

## 2017-04-11 DIAGNOSIS — M19041 Primary osteoarthritis, right hand: Secondary | ICD-10-CM | POA: Insufficient documentation

## 2017-04-11 DIAGNOSIS — M19042 Primary osteoarthritis, left hand: Secondary | ICD-10-CM | POA: Insufficient documentation

## 2017-04-12 DIAGNOSIS — I480 Paroxysmal atrial fibrillation: Secondary | ICD-10-CM | POA: Diagnosis not present

## 2017-04-12 DIAGNOSIS — Z5181 Encounter for therapeutic drug level monitoring: Secondary | ICD-10-CM | POA: Diagnosis not present

## 2017-04-12 DIAGNOSIS — E669 Obesity, unspecified: Secondary | ICD-10-CM | POA: Diagnosis not present

## 2017-04-12 DIAGNOSIS — Z79899 Other long term (current) drug therapy: Secondary | ICD-10-CM | POA: Diagnosis not present

## 2017-04-19 ENCOUNTER — Encounter: Payer: Self-pay | Admitting: Family Medicine

## 2017-04-19 DIAGNOSIS — R1084 Generalized abdominal pain: Secondary | ICD-10-CM

## 2017-04-19 NOTE — Telephone Encounter (Signed)
GI referral placed to [facility].

## 2017-04-19 NOTE — Telephone Encounter (Signed)
Needs a GI referral to [facility], see message DX abdominal pain.  Received: Today  Message Contents  Colon Branch, MD  Damita Dunnings, [place]

## 2017-05-02 DIAGNOSIS — H3582 Retinal ischemia: Secondary | ICD-10-CM | POA: Diagnosis not present

## 2017-05-02 DIAGNOSIS — H34831 Tributary (branch) retinal vein occlusion, right eye, with macular edema: Secondary | ICD-10-CM | POA: Diagnosis not present

## 2017-05-02 DIAGNOSIS — H35422 Microcystoid degeneration of retina, left eye: Secondary | ICD-10-CM | POA: Diagnosis not present

## 2017-05-02 DIAGNOSIS — H35371 Puckering of macula, right eye: Secondary | ICD-10-CM | POA: Diagnosis not present

## 2017-05-04 NOTE — Progress Notes (Signed)
error 

## 2017-05-08 ENCOUNTER — Encounter: Payer: Self-pay | Admitting: Family Medicine

## 2017-05-08 DIAGNOSIS — K219 Gastro-esophageal reflux disease without esophagitis: Secondary | ICD-10-CM

## 2017-05-08 MED ORDER — PANTOPRAZOLE SODIUM 40 MG PO TBEC: 40.0000 mg | DELAYED_RELEASE_TABLET | Freq: Every day | ORAL | 0 refills | Status: DC

## 2017-05-08 MED ORDER — PANTOPRAZOLE SODIUM 40 MG PO TBEC: 40.0000 mg | DELAYED_RELEASE_TABLET | Freq: Every day | ORAL | 1 refills | Status: DC

## 2017-05-18 ENCOUNTER — Telehealth: Payer: Self-pay | Admitting: *Deleted

## 2017-05-18 NOTE — Telephone Encounter (Signed)
Received 'Request for Missing Records' from [place] via Electrical engineer HIPAA from [place] to release records to [employer]. Requesting Restrictions Form to be completed; forwarded to provider with copy of FMLA from June 2018 attached/SLS 09/07

## 2017-05-21 ENCOUNTER — Encounter: Payer: Self-pay | Admitting: Family Medicine

## 2017-05-21 DIAGNOSIS — K219 Gastro-esophageal reflux disease without esophagitis: Secondary | ICD-10-CM

## 2017-05-21 MED ORDER — PANTOPRAZOLE SODIUM 40 MG PO TBEC: 40.0000 mg | DELAYED_RELEASE_TABLET | Freq: Two times a day (BID) | ORAL | 3 refills | Status: DC

## 2017-06-01 DIAGNOSIS — K2100 Gastro-esophageal reflux disease with esophagitis, without bleeding: Secondary | ICD-10-CM | POA: Insufficient documentation

## 2017-06-01 DIAGNOSIS — R1084 Generalized abdominal pain: Secondary | ICD-10-CM | POA: Diagnosis not present

## 2017-06-01 DIAGNOSIS — R1310 Dysphagia, unspecified: Secondary | ICD-10-CM | POA: Diagnosis not present

## 2017-06-04 ENCOUNTER — Telehealth: Payer: Self-pay | Admitting: *Deleted

## 2017-06-04 NOTE — Progress Notes (Addendum)
HPI: FU atrial fibrillation. Monitor March 2017 showed sinus rhythm with paroxysmal atrial fibrillation. Echocardiogram March 2017 showed normal LV systolic function, Grade 1 diastolic dysfunction, trace mitral and tricuspid regurgitation. TSH March 2017 normal at 0.94. Patient declined at that point. Since last seen, patient has occasional dyspnea but denies chest pain, palpitations or syncope. No recurrent atrial fibrillation. No bleeding.  Current Outpatient Prescriptions  Medication Sig Dispense Refill  . amLODipine (NORVASC) 5 MG tablet Take 1 tablet (5 mg total) by mouth daily. 90 tablet 3  . atenolol (TENORMIN) 50 MG tablet Take 1 tablet (50 mg total) by mouth daily. 90 tablet 3  . BIOTIN PO Take 1 tablet by mouth daily.    . Cholecalciferol (D 2000) 2000 units TABS Take 1 tablet by mouth daily.    . flecainide (TAMBOCOR) 100 MG tablet Take 1 tablet (100 mg total) by mouth 2 (two) times daily. 180 tablet 3  . FLUoxetine (PROZAC) 40 MG capsule Take 1 capsule (40 mg total) by mouth 2 (two) times daily. 180 capsule 3  . fluticasone (FLONASE) 50 MCG/ACT nasal spray Place 2 sprays into both nostrils daily. 16 g 1  . Krill Oil 1000 MG CAPS Take 1 capsule by mouth daily.    [name] lactobacillus acidophilus (BACID) TABS tablet Take 1 tablet by mouth daily.    [name] levothyroxine (SYNTHROID, LEVOTHROID) 125 MCG tablet Take 1 tablet (125 mcg total) by mouth daily before breakfast. 90 tablet 3  . lovastatin (MEVACOR) 40 MG tablet Take 40 mg by mouth at bedtime.    . LUTEIN PO Take 1 capsule by mouth daily.    . magnesium 30 MG tablet Take 30 mg by mouth daily.    . pantoprazole (PROTONIX) 40 MG tablet Take 1 tablet (40 mg total) by mouth 2 (two) times daily. 90 tablet 3  . Potassium 75 MG TABS Take 1 tablet by mouth daily.    . rivaroxaban (XARELTO) 20 MG TABS tablet Take 1 tablet (20 mg total) by mouth daily with supper. 90 tablet 3  . traZODone (DESYREL) 50 MG tablet Take 1 tablet (50 mg total) by  mouth at bedtime. 90 tablet 3  . Turmeric 450 MG CAPS Take 1 capsule by mouth daily.     No current facility-administered medications for this visit.      Past Medical History:  Diagnosis Date  . Atrial fibrillation ([place])   . Depression   . Diverticulitis   . Diverticulosis   . Esophageal ulcer   . GERD (gastroesophageal reflux disease)   . Hyperlipidemia   . Hypertension   . Hypothyroid   . OSA (obstructive sleep apnea) 08/31/2016    Past Surgical History:  Procedure Laterality Date  . CATARACT EXTRACTION    . HAND SURGERY    . KNEE SURGERY    . ROTATOR CUFF REPAIR    . TOTAL ABDOMINAL HYSTERECTOMY      Social History   Social History  . Marital status: Married    Spouse name: Jenny Reichmann  . Number of children: 1  . Years of education: College   Occupational History  . Admin Assist. Other    [employer]   Social History Main Topics  . Smoking status: Never Smoker  . Smokeless tobacco: Never Used  . Alcohol use Yes     Comment: rare  . Drug use: No  . Sexual activity: Not on file   Other Topics Concern  . Not on file  Social History Narrative   Patient lives at home spouse.   Caffeine use: 2 sodas weelky    Family History  Problem Relation Age of Onset  . Atrial fibrillation Mother   . Congestive Heart Failure Mother   . Heart disease Father   . Heart disease Brother   . Heart disease Brother     ROS: no fevers or chills, productive cough, hemoptysis, dysphasia, odynophagia, melena, hematochezia, dysuria, hematuria, rash, seizure activity, orthopnea, PND, pedal edema, claudication. Remaining systems are negative.  Physical Exam: Well-developed obese in no acute distress.  Skin is warm and dry.  HEENT is normal.  Neck is supple.  Chest is clear to auscultation with normal expansion.  Cardiovascular exam is regular rate and rhythm.  Abdominal exam nontender or distended. No masses palpated. Extremities show no edema. neuro grossly  intact  ECG- Sinus rhythm at a rate of 53. First-degree AV block. No ST changes. personally reviewed  A/P  1 Paroxysmal atrial fibrillation-we will continue with atenolol for rate control if atrial fibrillation recurs. Continue flecanide. Continue xarelto. I will arrange an exercise treadmill to exclude exercise-induced ventricular arrhythmias on flecainide.  2 hypertension-blood pressure is controlled. Continue present medications.  3 hyperlipidemia-continue statin. Management per primary care.  4 preoperative evaluation prior to EGD/esophageal dilatation-okay to proceed with procedure from a cardiac standpoint. Hold anticoagulation 3 days prior to procedure and resume after when okay with GI.  Kirk Ruths, MD

## 2017-06-04 NOTE — Telephone Encounter (Signed)
Received Interoffice mail from [employer] requesting PCP review & complete highlighted areas, then sign; forwarded to provider/SLS 09/24

## 2017-06-06 ENCOUNTER — Ambulatory Visit (INDEPENDENT_AMBULATORY_CARE_PROVIDER_SITE_OTHER): Payer: Medicare Other | Admitting: Cardiology

## 2017-06-06 ENCOUNTER — Telehealth: Payer: Self-pay | Admitting: *Deleted

## 2017-06-06 ENCOUNTER — Encounter: Payer: Self-pay | Admitting: Cardiology

## 2017-06-06 VITALS — BP 126/83 | HR 53 | Ht 61.0 in | Wt 211.1 lb

## 2017-06-06 DIAGNOSIS — I480 Paroxysmal atrial fibrillation: Secondary | ICD-10-CM | POA: Diagnosis not present

## 2017-06-06 DIAGNOSIS — I10 Essential (primary) hypertension: Secondary | ICD-10-CM

## 2017-06-06 DIAGNOSIS — E7800 Pure hypercholesterolemia, unspecified: Secondary | ICD-10-CM | POA: Diagnosis not present

## 2017-06-06 NOTE — Telephone Encounter (Signed)
Office note from today containing clearance for EGD and Xarelto recommendations faxed to the number provided.

## 2017-06-06 NOTE — Patient Instructions (Signed)
Medication Instructions:   NO CHANGE  Testing/Procedures:  Your physician has requested that you have an exercise tolerance test. For further information please visit [email or web address]. Please also follow instruction sheet, as given.     Follow-Up:  Your physician wants you to follow-up in: [place] will receive a reminder letter in the mail two months in advance. If you don't receive a letter, please call our office to schedule the follow-up appointment.   If you need a refill on your cardiac medications before your next appointment, please call your pharmacy.    Exercise Stress Electrocardiogram An exercise stress electrocardiogram is a test to check how blood flows to your heart. It is done to find areas of poor blood flow. You will need to walk on a treadmill for this test. The electrocardiogram will record your heartbeat when you are at rest and when you are exercising. What happens before the procedure?  Do not have drinks with caffeine or foods with caffeine for 24 hours before the test, or as told by your doctor. This includes coffee, tea (even decaf tea), sodas, chocolate, and cocoa.  Follow your doctor's instructions about eating and drinking before the test.  Ask your doctor what medicines you should or should not take before the test. Take your medicines with water unless told by your doctor not to.  If you use an inhaler, bring it with you to the test.  Bring a snack to eat after the test.  Do not  smoke for 4 hours before the test.  Do not put lotions, powders, creams, or oils on your chest before the test.  Wear comfortable shoes and clothing. What happens during the procedure?  You will have patches put on your chest. Small areas of your chest may need to be shaved. Wires will be connected to the patches.  Your heart rate will be watched while you are resting and while you are exercising.  You will walk on the treadmill. The treadmill  will slowly get faster to raise your heart rate.  The test will take about 1-2 hours. What happens after the procedure?  Your heart rate and blood pressure will be watched after the test.  You may return to your normal diet, activities, and medicines or as told by your doctor. This information is not intended to replace advice given to you by your health care provider. Make sure you discuss any questions you have with your health care provider. Document Released: 02/14/2008 Document Revised: 04/26/2016 Document Reviewed: 05/05/2013 Elsevier Interactive Patient Education  [employer].

## 2017-06-07 DIAGNOSIS — H4311 Vitreous hemorrhage, right eye: Secondary | ICD-10-CM | POA: Diagnosis not present

## 2017-06-07 DIAGNOSIS — H348312 Tributary (branch) retinal vein occlusion, right eye, stable: Secondary | ICD-10-CM | POA: Diagnosis not present

## 2017-06-07 DIAGNOSIS — H3582 Retinal ischemia: Secondary | ICD-10-CM | POA: Diagnosis not present

## 2017-06-07 DIAGNOSIS — H35371 Puckering of macula, right eye: Secondary | ICD-10-CM | POA: Diagnosis not present

## 2017-06-14 ENCOUNTER — Telehealth (HOSPITAL_COMMUNITY): Payer: Self-pay

## 2017-06-14 NOTE — Telephone Encounter (Signed)
Encounter complete. 

## 2017-06-19 DIAGNOSIS — R1013 Epigastric pain: Secondary | ICD-10-CM | POA: Diagnosis not present

## 2017-06-19 DIAGNOSIS — K219 Gastro-esophageal reflux disease without esophagitis: Secondary | ICD-10-CM | POA: Diagnosis not present

## 2017-06-19 DIAGNOSIS — K229 Disease of esophagus, unspecified: Secondary | ICD-10-CM | POA: Diagnosis not present

## 2017-06-19 DIAGNOSIS — K319 Disease of stomach and duodenum, unspecified: Secondary | ICD-10-CM | POA: Diagnosis not present

## 2017-06-20 ENCOUNTER — Inpatient Hospital Stay (HOSPITAL_COMMUNITY): Admission: RE | Admit: 2017-06-20 | Payer: Medicare Other | Source: Ambulatory Visit

## 2017-06-27 ENCOUNTER — Ambulatory Visit (HOSPITAL_COMMUNITY)
Admission: RE | Admit: 2017-06-27 | Discharge: 2017-06-27 | Disposition: A | Discharge status: Home / Self Care | Payer: Medicare Other | Source: Ambulatory Visit | Attending: Cardiovascular Disease | Admitting: Cardiovascular Disease

## 2017-06-27 DIAGNOSIS — I480 Paroxysmal atrial fibrillation: Secondary | ICD-10-CM | POA: Diagnosis not present

## 2017-06-27 LAB — EXERCISE TOLERANCE TEST
Estimated workload: 6.2 METS
Exercise duration (min): 4 min
Exercise duration (sec): 20 s
MPHR: 153 {beats}/min
Peak HR: 97 {beats}/min
Percent HR: 63 %
RPE: 19
Rest HR: 60 {beats}/min

## 2017-07-02 DIAGNOSIS — Z23 Encounter for immunization: Secondary | ICD-10-CM | POA: Diagnosis not present

## 2017-07-11 DIAGNOSIS — H348312 Tributary (branch) retinal vein occlusion, right eye, stable: Secondary | ICD-10-CM | POA: Diagnosis not present

## 2017-07-11 DIAGNOSIS — H4311 Vitreous hemorrhage, right eye: Secondary | ICD-10-CM | POA: Diagnosis not present

## 2017-07-11 DIAGNOSIS — H35422 Microcystoid degeneration of retina, left eye: Secondary | ICD-10-CM | POA: Diagnosis not present

## 2017-07-11 DIAGNOSIS — H43813 Vitreous degeneration, bilateral: Secondary | ICD-10-CM | POA: Diagnosis not present

## 2017-07-19 DIAGNOSIS — R1010 Upper abdominal pain, unspecified: Secondary | ICD-10-CM | POA: Insufficient documentation

## 2017-07-19 DIAGNOSIS — K210 Gastro-esophageal reflux disease with esophagitis: Secondary | ICD-10-CM | POA: Diagnosis not present

## 2017-07-23 DIAGNOSIS — H348311 Tributary (branch) retinal vein occlusion, right eye, with retinal neovascularization: Secondary | ICD-10-CM | POA: Diagnosis not present

## 2017-07-23 DIAGNOSIS — H34831 Tributary (branch) retinal vein occlusion, right eye, with macular edema: Secondary | ICD-10-CM | POA: Diagnosis not present

## 2017-07-23 DIAGNOSIS — H2640 Unspecified secondary cataract: Secondary | ICD-10-CM | POA: Diagnosis not present

## 2017-07-23 DIAGNOSIS — H26491 Other secondary cataract, right eye: Secondary | ICD-10-CM | POA: Diagnosis not present

## 2017-07-23 DIAGNOSIS — H4311 Vitreous hemorrhage, right eye: Secondary | ICD-10-CM | POA: Diagnosis not present

## 2017-07-24 DIAGNOSIS — H4311 Vitreous hemorrhage, right eye: Secondary | ICD-10-CM | POA: Diagnosis not present

## 2017-07-24 DIAGNOSIS — H348312 Tributary (branch) retinal vein occlusion, right eye, stable: Secondary | ICD-10-CM | POA: Diagnosis not present

## 2017-07-31 DIAGNOSIS — H34831 Tributary (branch) retinal vein occlusion, right eye, with macular edema: Secondary | ICD-10-CM | POA: Diagnosis not present

## 2017-08-12 NOTE — Progress Notes (Addendum)
[facility] [address], [place],  [address] [phone]  Date:  08/13/2017   Name:  Lisa Acosta   DOB:  1951/05/01   MRN:  [id number]  PCP:  Darreld Mclean, MD    Chief Complaint: Altered Mental Status (c/o having confusion that has been present for a couple of weeks. )   History of Present Illness:  Lisa Acosta is a 66 y.o. very pleasant female patient who presents with the following:  History of a fib, HTN, hyperlipidemia, hypothyroidism, OSA Here today with concern of getting disoriented.  She notes that she has gotten lost or disoriented in a familiar area a few times over the last month or so.   She also notes that her memory has historically been very good.    Seemed to start after she had an endoscopy and then eye surgery.  She was under anesthesia of some type for the the endoscopy and then was sedated for the eye surgery   She wonders if her B12 could be low- we can check this for her today  Last seen by myself in June at which time we discussed the worsening OA in her hands which caused her to need to quit working  Her husband has early dementia, cognitive impairment.  However he is also a bit drinker Her mom has dementia but did not have sx until her 20s.    Pt has noted somewhat more frequent HA recently but thought this might be due to her recent eye surgery No issues with numbness or weakness of her body   Flu shot done in October at [facility] She had prevnar 13 last year, we will give her pneumovax today  We discussed the OA in her hands today- she is stable in this regard, much better now that she is retired   Patient Active Problem List   Diagnosis Date Noted  . 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 Dyspnea 09/22/2016  . OSA (obstructive sleep apnea) 08/31/2016  . PAF (paroxysmal atrial fibrillation) ([place]) 03/16/2016  . Hypothyroid 03/16/2016  . Obesity (BMI  30-39.9) 03/16/2016  . Atrial fibrillation ([place]) 01/05/2016  . Palpitations 11/10/2015  . Essential hypertension 11/10/2015  . Hyperlipidemia 11/10/2015    Past Medical History:  Diagnosis Date  . Atrial fibrillation ([place])   . Depression   . Diverticulitis   . Diverticulosis   . Esophageal ulcer   . GERD (gastroesophageal reflux disease)   . Hyperlipidemia   . Hypertension   . Hypothyroid   . OSA (obstructive sleep apnea) 08/31/2016    Past Surgical History:  Procedure Laterality Date  . CATARACT EXTRACTION    . HAND SURGERY    . KNEE SURGERY    . ROTATOR CUFF REPAIR    . TOTAL ABDOMINAL HYSTERECTOMY      Social History   Tobacco Use  . Smoking status: Never Smoker  . Smokeless tobacco: Never Used  Substance Use Topics  . Alcohol use: Yes    Comment: rare  . Drug use: No    Family History  Problem Relation Age of Onset  . Atrial fibrillation Mother   . Congestive Heart Failure Mother   . Heart disease Father   . Heart disease Brother   . Heart disease Brother     Allergies  Allergen Reactions  . Dextran Anaphylaxis  . Dextrans   . Dextromethorphan Hbr Other (See Comments)    hallucinations  .  Tree Extract   . Penicillin G Rash  . Penicillins Rash  . Sulfa Antibiotics Rash  . Sulfacetamide Sodium Rash    Medication list has been reviewed and updated.  Current Outpatient Medications on File Prior to Visit  Medication Sig Dispense Refill  . amLODipine (NORVASC) 5 MG tablet Take 1 tablet (5 mg total) by mouth daily. 90 tablet 3  . atenolol (TENORMIN) 50 MG tablet Take 1 tablet (50 mg total) by mouth daily. 90 tablet 3  . BIOTIN PO Take 1 tablet by mouth daily.    . Cholecalciferol (D 2000) 2000 units TABS Take 1 tablet by mouth daily.    . flecainide (TAMBOCOR) 100 MG tablet Take 1 tablet (100 mg total) by mouth 2 (two) times daily. 180 tablet 3  . FLUoxetine (PROZAC) 40 MG capsule Take 1 capsule (40 mg total) by mouth 2 (two) times daily. 180  capsule 3  . fluticasone (FLONASE) 50 MCG/ACT nasal spray Place 2 sprays into both nostrils daily. 16 g 1  . Krill Oil 1000 MG CAPS Take 1 capsule by mouth daily.    [name] lactobacillus acidophilus (BACID) TABS tablet Take 1 tablet by mouth daily.    [name] levothyroxine (SYNTHROID, LEVOTHROID) 125 MCG tablet Take 1 tablet (125 mcg total) by mouth daily before breakfast. 90 tablet 3  . lovastatin (MEVACOR) 40 MG tablet Take 40 mg by mouth at bedtime.    . LUTEIN PO Take 1 capsule by mouth daily.    . magnesium 30 MG tablet Take 30 mg by mouth daily.    . pantoprazole (PROTONIX) 40 MG tablet Take 1 tablet (40 mg total) by mouth 2 (two) times daily. 90 tablet 3  . Potassium 75 MG TABS Take 1 tablet by mouth daily.    . rivaroxaban (XARELTO) 20 MG TABS tablet Take 1 tablet (20 mg total) by mouth daily with supper. 90 tablet 3  . traZODone (DESYREL) 50 MG tablet Take 1 tablet (50 mg total) by mouth at bedtime. 90 tablet 3  . Turmeric 450 MG CAPS Take 1 capsule by mouth daily.     No current facility-administered medications on file prior to visit.     Review of Systems:  As per HPI- otherwise negative.   Physical Examination: Vitals:   08/13/17 1126  BP: 132/72  Pulse: (!) 56  Temp: 98.8 F (37.1 C)  SpO2: 97%   Vitals:   08/13/17 1126  Weight: 210 lb 9.6 oz (95.5 kg)  Height: 5\' 1"  (1.549 m)   Body mass index is 39.79 kg/m. Ideal Body Weight: Weight in (lb) to have BMI = 25: 132  GEN: WDWN, NAD, Non-toxic, A & O x 3, obese, looks well HEENT: Atraumatic, Normocephalic. Neck supple. No masses, No LAD.  Bilateral TM wnl, oropharynx normal.  PEERL,EOMI.   Ears and Nose: No external deformity. CV: RRR, No M/G/R. No JVD. No thrill. No extra heart sounds. PULM: CTA B, no wheezes, crackles, rhonchi. No retractions. No resp. distress. No accessory muscle use. ABD: S, NT, ND, +BS. No rebound. No HSM. EXTR: No c/c/e Her hands- esp left- show evidence of OA with joint nodules and tenderness   NEURO Normal gait.  PSYCH: Normally interactive. Conversant. Not depressed or anxious appearing.  Calm demeanor.   She got a perfect score on MMSE today Assessment and Plan: Memory loss - Plan: B12  Dyslipidemia  Hypothyroidism due to acquired atrophy of thyroid - Plan: TSH  Essential hypertension - Plan: CBC, Comprehensive metabolic panel  Pre-diabetes - Plan: Hemoglobin A1c  Primary osteoarthritis of both hands  Pain in both hands  Here today with concern about her memory- specifically getting lost in familiar surroundings and feeling disoriented.  We will obtain labs for her as above, and plan to have her see neurology assuming all is ok   Will plan further follow- up pending labs. Continue current treatment for her OA   Signed Lamar Blinks, MD  Received her labs 12/4 Results for orders placed or performed in visit on 08/13/17  CBC  Result Value Ref Range   WBC 7.6 4.0 - 10.5 K/uL   RBC 4.61 3.87 - 5.11 Mil/uL   Platelets 264.0 150.0 - 400.0 K/uL   Hemoglobin 13.7 12.0 - 15.0 g/dL   HCT 41.5 36.0 - 46.0 %   MCV 90.0 78.0 - 100.0 fl   MCHC 33.0 30.0 - 36.0 g/dL   RDW 13.7 11.5 - 15.5 %  Comprehensive metabolic panel  Result Value Ref Range   Sodium 142 135 - 145 mEq/L   Potassium 4.7 3.5 - 5.1 mEq/L   Chloride 104 96 - 112 mEq/L   CO2 29 19 - 32 mEq/L   Glucose, Bld 86 70 - 99 mg/dL   BUN 19 6 - 23 mg/dL   Creatinine, Ser 0.89 0.40 - 1.20 mg/dL   Total Bilirubin 0.6 0.2 - 1.2 mg/dL   Alkaline Phosphatase 94 39 - 117 U/L   AST 18 0 - 37 U/L   ALT 16 0 - 35 U/L   Total Protein 7.3 6.0 - 8.3 g/dL   Albumin 4.2 3.5 - 5.2 g/dL   Calcium 9.8 8.4 - 10.5 mg/dL   GFR 67.18 >60.00 mL/min  TSH  Result Value Ref Range   TSH 1.81 0.35 - 4.50 uIU/mL  Hemoglobin A1c  Result Value Ref Range   Hgb A1c MFr Bld 5.8 4.6 - 6.5 %  B12  Result Value Ref Range   Vitamin B-12 >1500 (H) 211 - 911 pg/mL

## 2017-08-13 ENCOUNTER — Ambulatory Visit (INDEPENDENT_AMBULATORY_CARE_PROVIDER_SITE_OTHER): Payer: Medicare Other | Admitting: Family Medicine

## 2017-08-13 ENCOUNTER — Encounter: Payer: Self-pay | Admitting: Family Medicine

## 2017-08-13 VITALS — BP 132/72 | HR 56 | Temp 98.8°F | Ht 61.0 in | Wt 210.6 lb

## 2017-08-13 DIAGNOSIS — R413 Other amnesia: Secondary | ICD-10-CM

## 2017-08-13 DIAGNOSIS — I10 Essential (primary) hypertension: Secondary | ICD-10-CM

## 2017-08-13 DIAGNOSIS — M19041 Primary osteoarthritis, right hand: Secondary | ICD-10-CM

## 2017-08-13 DIAGNOSIS — M79642 Pain in left hand: Secondary | ICD-10-CM | POA: Diagnosis not present

## 2017-08-13 DIAGNOSIS — E034 Atrophy of thyroid (acquired): Secondary | ICD-10-CM | POA: Diagnosis not present

## 2017-08-13 DIAGNOSIS — M19042 Primary osteoarthritis, left hand: Secondary | ICD-10-CM | POA: Diagnosis not present

## 2017-08-13 DIAGNOSIS — E785 Hyperlipidemia, unspecified: Secondary | ICD-10-CM

## 2017-08-13 DIAGNOSIS — M79641 Pain in right hand: Secondary | ICD-10-CM | POA: Diagnosis not present

## 2017-08-13 DIAGNOSIS — R7303 Prediabetes: Secondary | ICD-10-CM | POA: Diagnosis not present

## 2017-08-13 LAB — TSH: TSH: 1.81 u[IU]/mL (ref 0.35–4.50)

## 2017-08-13 LAB — CBC
HCT: 41.5 % (ref 36.0–46.0)
Hemoglobin: 13.7 g/dL (ref 12.0–15.0)
MCHC: 33 g/dL (ref 30.0–36.0)
MCV: 90 fl (ref 78.0–100.0)
Platelets: 264 10*3/uL (ref 150.0–400.0)
RBC: 4.61 Mil/uL (ref 3.87–5.11)
RDW: 13.7 % (ref 11.5–15.5)
WBC: 7.6 10*3/uL (ref 4.0–10.5)

## 2017-08-13 LAB — VITAMIN B12: Vitamin B-12: >1500 pg/mL — ABNORMAL HIGH (ref 211–911)

## 2017-08-13 LAB — HEMOGLOBIN A1C: Hgb A1c MFr Bld: 5.8 % (ref 4.6–6.5)

## 2017-08-13 NOTE — Patient Instructions (Addendum)
It was good to see you today- I'll be in touch with your labs asap.  Assuming all is ok, I will send you to see neurology for further evaluation   I'm sorry that we are out of the Pneumovax pneumonia vaccine that you need!  We can do this for you next time we see you

## 2017-08-14 ENCOUNTER — Encounter: Payer: Self-pay | Admitting: Family Medicine

## 2017-08-14 LAB — COMPREHENSIVE METABOLIC PANEL
ALT: 16 U/L (ref 0–35)
AST: 18 U/L (ref 0–37)
Albumin: 4.2 g/dL (ref 3.5–5.2)
Alkaline Phosphatase: 94 U/L (ref 39–117)
BUN: 19 mg/dL (ref 6–23)
CO2: 29 mEq/L (ref 19–32)
Calcium: 9.8 mg/dL (ref 8.4–10.5)
Chloride: 104 mEq/L (ref 96–112)
Creatinine, Ser: 0.89 mg/dL (ref 0.40–1.20)
GFR: 67.18 mL/min (ref >60.00)
Glucose, Bld: 86 mg/dL (ref 70–99)
Potassium: 4.7 mEq/L (ref 3.5–5.1)
Sodium: 142 mEq/L (ref 135–145)
Total Bilirubin: 0.6 mg/dL (ref 0.2–1.2)
Total Protein: 7.3 g/dL (ref 6.0–8.3)

## 2017-08-14 NOTE — Addendum Note (Signed)
Addended by: Lamar Blinks C on: 08/14/2017 12:06 PM   Modules accepted: Orders

## 2017-08-15 ENCOUNTER — Telehealth: Payer: Self-pay | Admitting: Cardiology

## 2017-08-15 NOTE — Telephone Encounter (Signed)
Unable to reach pt or leave a message mailbox is not set up. Samples placed at the front desk for pick up.

## 2017-08-15 NOTE — Telephone Encounter (Signed)
New Message   Patient calling the office for samples of medication:   1.  What medication and dosage are you requesting samples for? Xarelto 20mg    2.  Are you currently out of this medication? Yes   Pt would like to know if [place] or [place] [facility] would have samples. Please call back to discuss

## 2017-08-16 NOTE — Telephone Encounter (Signed)
Patient aware samples are at the front desk for pick up  

## 2017-08-21 ENCOUNTER — Emergency Department (HOSPITAL_BASED_OUTPATIENT_CLINIC_OR_DEPARTMENT_OTHER)
Admission: EM | Admit: 2017-08-21 | Discharge: 2017-08-21 | Disposition: A | Discharge status: Home / Self Care | Payer: Medicare Other | Attending: Emergency Medicine | Admitting: Emergency Medicine

## 2017-08-21 ENCOUNTER — Other Ambulatory Visit: Payer: Self-pay

## 2017-08-21 ENCOUNTER — Encounter (HOSPITAL_BASED_OUTPATIENT_CLINIC_OR_DEPARTMENT_OTHER): Payer: Self-pay

## 2017-08-21 ENCOUNTER — Emergency Department (HOSPITAL_BASED_OUTPATIENT_CLINIC_OR_DEPARTMENT_OTHER): Payer: Medicare Other

## 2017-08-21 DIAGNOSIS — R1013 Epigastric pain: Secondary | ICD-10-CM

## 2017-08-21 DIAGNOSIS — K839 Disease of biliary tract, unspecified: Secondary | ICD-10-CM | POA: Insufficient documentation

## 2017-08-21 DIAGNOSIS — R109 Unspecified abdominal pain: Secondary | ICD-10-CM

## 2017-08-21 DIAGNOSIS — K824 Cholesterolosis of gallbladder: Secondary | ICD-10-CM | POA: Diagnosis not present

## 2017-08-21 DIAGNOSIS — Z7901 Long term (current) use of anticoagulants: Secondary | ICD-10-CM | POA: Diagnosis not present

## 2017-08-21 DIAGNOSIS — R1012 Left upper quadrant pain: Secondary | ICD-10-CM | POA: Diagnosis not present

## 2017-08-21 DIAGNOSIS — R1011 Right upper quadrant pain: Secondary | ICD-10-CM | POA: Diagnosis not present

## 2017-08-21 DIAGNOSIS — I10 Essential (primary) hypertension: Secondary | ICD-10-CM | POA: Insufficient documentation

## 2017-08-21 DIAGNOSIS — E039 Hypothyroidism, unspecified: Secondary | ICD-10-CM | POA: Diagnosis not present

## 2017-08-21 DIAGNOSIS — K828 Other specified diseases of gallbladder: Secondary | ICD-10-CM

## 2017-08-21 LAB — CBC WITH DIFFERENTIAL/PLATELET
Basophils Absolute: 0 10*3/uL (ref 0.0–0.1)
Basophils Relative: 0 %
Eosinophils Absolute: 0.1 10*3/uL (ref 0.0–0.7)
Eosinophils Relative: 1 %
HCT: 40.5 % (ref 36.0–46.0)
Hemoglobin: 13.6 g/dL (ref 12.0–15.0)
Lymphocytes Relative: 17 %
Lymphs Abs: 1.7 10*3/uL (ref 0.7–4.0)
MCH: 29.5 pg (ref 26.0–34.0)
MCHC: 33.6 g/dL (ref 30.0–36.0)
MCV: 87.9 fL (ref 78.0–100.0)
Monocytes Absolute: 0.6 10*3/uL (ref 0.1–1.0)
Monocytes Relative: 6 %
Neutro Abs: 7.6 10*3/uL (ref 1.7–7.7)
Neutrophils Relative %: 76 %
Platelets: 259 10*3/uL (ref 150–400)
RBC: 4.61 MIL/uL (ref 3.87–5.11)
RDW: 13.1 % (ref 11.5–15.5)
WBC: 10.1 10*3/uL (ref 4.0–10.5)

## 2017-08-21 LAB — COMPREHENSIVE METABOLIC PANEL
ALT: 20 U/L (ref 14–54)
AST: 24 U/L (ref 15–41)
Albumin: 4.2 g/dL (ref 3.5–5.0)
Alkaline Phosphatase: 103 U/L (ref 38–126)
Anion gap: 7 (ref 5–15)
BUN: 20 mg/dL (ref 6–20)
CO2: 28 mmol/L (ref 22–32)
Calcium: 9.3 mg/dL (ref 8.9–10.3)
Chloride: 104 mmol/L (ref 101–111)
Creatinine, Ser: 0.74 mg/dL (ref 0.44–1.00)
GFR calc Af Amer: >60 mL/min (ref >60)
GFR calc non Af Amer: >60 mL/min (ref >60)
Glucose, Bld: 98 mg/dL (ref 65–99)
Potassium: 3.8 mmol/L (ref 3.5–5.1)
Sodium: 139 mmol/L (ref 135–145)
Total Bilirubin: 0.9 mg/dL (ref 0.3–1.2)
Total Protein: 7.5 g/dL (ref 6.5–8.1)

## 2017-08-21 LAB — TROPONIN I: Troponin I: <0.03 ng/mL (ref <0.03)

## 2017-08-21 LAB — LIPASE, BLOOD: Lipase: 24 U/L (ref 11–51)

## 2017-08-21 MED ORDER — HYDROCODONE-ACETAMINOPHEN 5-325 MG PO TABS: 0.5000 | ORAL_TABLET | ORAL | 0 refills | Status: DC | PRN

## 2017-08-21 MED ORDER — ONDANSETRON 4 MG PO TBDP
4.0000 mg | ORAL_TABLET | Freq: Once | ORAL | Status: AC
  Administered 2017-08-21: 4 mg via ORAL
  Filled 2017-08-21: qty 1

## 2017-08-21 MED ORDER — ONDANSETRON 4 MG PO TBDP: 4.0000 mg | ORAL_TABLET | Freq: Three times a day (TID) | ORAL | 0 refills | Status: DC | PRN

## 2017-08-21 MED ORDER — GI COCKTAIL ~~LOC~~
30.0000 mL | Freq: Once | ORAL | Status: AC
  Administered 2017-08-21: 30 mL via ORAL
  Filled 2017-08-21: qty 30

## 2017-08-21 NOTE — ED Triage Notes (Signed)
Pt c/o epigastric pain x 2 months-had endoscopy in Oct-no dx-states severe abd pain x 1 week-CP last night-denies CP at this time-NAD-steady gait

## 2017-08-21 NOTE — ED Provider Notes (Signed)
[facility] EM[facility] DEPART[facility] Provider Note   CSN: [id number] Arrival date & time: 08/21/17  1246     History   Chief Complaint Chief Complaint  Patient presents with  . Abdominal Pain    HPI Lisa Acosta is a 66 y.o. female.  The history is provided by the patient.  Abdominal Pain   This is a recurrent problem. Episode onset: worse over the last 1 week. The problem occurs daily. The problem has been gradually worsening. The pain is associated with eating. The pain is located in the RUQ, LUQ, epigastric region and chest (radiates up to the central chest from the epigastric area). The quality of the pain is aching, cramping, colicky, pressure-like and burning. The pain is at a severity of 0/10. The patient is experiencing no pain (pain went away since being here). Associated symptoms include anorexia, nausea and vomiting. Pertinent negatives include fever, diarrhea, constipation, dysuria, hematuria, headaches and myalgias. The symptoms are aggravated by eating. Nothing relieves the symptoms. Past workup includes GI consult. Past workup comments: hx of esophageal ulcer and had endoscopy 1 month ago which showed no signs of ulcer but did have pantoprazole doubled.    Past Medical History:  Diagnosis Date  . Atrial fibrillation ([place])   . Depression   . Diverticulitis   . Diverticulosis   . Esophageal ulcer   . GERD (gastroesophageal reflux disease)   . Hyperlipidemia   . Hypertension   . Hypothyroid   . OSA (obstructive sleep apnea) 08/31/2016    Patient Active Problem List   Diagnosis Date Noted  . 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 Dyspnea 09/22/2016  . OSA (obstructive sleep apnea) 08/31/2016  . PAF (paroxysmal atrial fibrillation) ([place]) 03/16/2016  . Hypothyroid 03/16/2016  . Obesity (BMI 30-39.9) 03/16/2016  . Atrial fibrillation ([place]) 01/05/2016  . Palpitations 11/10/2015  . Essential hypertension 11/10/2015  . Hyperlipidemia  11/10/2015    Past Surgical History:  Procedure Laterality Date  . CATARACT EXTRACTION    . HAND SURGERY    . KNEE SURGERY    . ROTATOR CUFF REPAIR    . TOTAL ABDOMINAL HYSTERECTOMY      OB History    No data available       Home Medications    Prior to Admission medications   Medication Sig Start Date End Date Taking? Authorizing Provider  amLODipine (NORVASC) 5 MG tablet Take 1 tablet (5 mg total) by mouth daily. 10/18/16   Copland, Gay Filler, MD  atenolol (TENORMIN) 50 MG tablet Take 1 tablet (50 mg total) by mouth daily. 11/29/16   Copland, Gay Filler, MD  BIOTIN PO Take 1 tablet by mouth daily.    [provider]  Cholecalciferol (D 2000) 2000 units TABS Take 1 tablet by mouth daily.    [provider]  flecainide (TAMBOCOR) 100 MG tablet Take 1 tablet (100 mg total) by mouth 2 (two) times daily. 10/18/16 10/18/17  Copland, Gay Filler, MD  FLUoxetine (PROZAC) 40 MG capsule Take 1 capsule (40 mg total) by mouth 2 (two) times daily. 10/18/16   Copland, Gay Filler, MD  fluticasone (FLONASE) 50 MCG/ACT nasal spray Place 2 sprays into both nostrils daily. 12/15/16   Saguier, Percell Miller, PA-C  Krill Oil 1000 MG CAPS Take 1 capsule by mouth daily.    [provider]  lactobacillus acidophilus (BACID) TABS tablet Take 1 tablet by mouth daily.    [provider]  levothyroxine (SYNTHROID, LEVOTHROID) 125 MCG tablet Take 1 tablet (  125 mcg total) by mouth daily before breakfast. 10/18/16   Copland, Gay Filler, MD  lovastatin (MEVACOR) 40 MG tablet Take 40 mg by mouth at bedtime.    [provider]  LUTEIN PO Take 1 capsule by mouth daily.    [provider]  magnesium 30 MG tablet Take 30 mg by mouth daily.    [provider]  pantoprazole (PROTONIX) 40 MG tablet Take 1 tablet (40 mg total) by mouth 2 (two) times daily. 05/21/17   Copland, Gay Filler, MD  Potassium 75 MG TABS Take 1 tablet by mouth daily.    [provider]  rivaroxaban  (XARELTO) 20 MG TABS tablet Take 1 tablet (20 mg total) by mouth daily with supper. 10/18/16   Copland, Gay Filler, MD  traZODone (DESYREL) 50 MG tablet Take 1 tablet (50 mg total) by mouth at bedtime. 01/22/17   Copland, Gay Filler, MD  Turmeric 450 MG CAPS Take 1 capsule by mouth daily.    [provider]    Family History Family History  Problem Relation Age of Onset  . Atrial fibrillation Mother   . Congestive Heart Failure Mother   . Heart disease Father   . Heart disease Brother   . Heart disease Brother     Social History Social History   Tobacco Use  . Smoking status: Never Smoker  . Smokeless tobacco: Never Used  Substance Use Topics  . Alcohol use: Yes    Comment: rare  . Drug use: No     Allergies   Dextran; Dextrans; Dextromethorphan hbr; Tree extract; Penicillin g; Penicillins; Sulfa antibiotics; and Sulfacetamide sodium   Review of Systems Review of Systems  Constitutional: Negative for fever.  Gastrointestinal: Positive for abdominal pain, anorexia, nausea and vomiting. Negative for constipation and diarrhea.  Genitourinary: Negative for dysuria and hematuria.  Musculoskeletal: Negative for myalgias.  Neurological: Negative for headaches.  All other systems reviewed and are negative.    Physical Exam Updated Vital Signs BP 132/70 (BP Location: Right Arm)   Pulse (!) 57   Temp 98.9 F (37.2 C) (Oral)   Resp 18   Ht 5\' 1"  (1.549 m)   Wt 94.8 kg (209 lb)   SpO2 93%   BMI 39.49 kg/m   Physical Exam  Constitutional: She is oriented to person, place, and time. She appears well-developed and well-nourished. No distress.  HENT:  Head: Normocephalic and atraumatic.  Mouth/Throat: Oropharynx is clear and moist.  Eyes: Conjunctivae and EOM are normal. Pupils are equal, round, and reactive to light.  Neck: Normal range of motion. Neck supple.  Cardiovascular: Normal rate, regular rhythm and intact distal pulses.  No murmur  heard. Pulmonary/Chest: Effort normal and breath sounds normal. No respiratory distress. She has no wheezes. She has no rales.  Abdominal: Soft. She exhibits no distension. There is no tenderness. There is no rebound and no guarding. No hernia.  Musculoskeletal: Normal range of motion. She exhibits no edema or tenderness.  Neurological: She is alert and oriented to person, place, and time.  Skin: Skin is warm and dry. No rash noted. No erythema.  Psychiatric: She has a normal mood and affect. Her behavior is normal.  Nursing note and vitals reviewed.    ED Treatments / Results  Labs (all labs ordered are listed, but only abnormal results are displayed) Labs Reviewed  CBC WITH DIFFERENTIAL/PLATELET  COMPREHENSIVE METABOLIC PANEL  LIPASE, BLOOD  TROPONIN I    EKG  EKG Interpretation  Date/Time:  Tuesday August 21 2017 13:07:54 EST Ventricular Rate:  52 PR Interval:  238 QRS Duration: 96 QT Interval:  452 QTC Calculation: 420 R Axis:   37 Text Interpretation:  Sinus bradycardia with 1st degree A-V block Possible Anterior infarct , age undetermined Abnormal ECG Confirmed by Pryor Curia [phone]) on 08/21/2017 1:12:44 PM       Radiology US Abdomen Limited Ruq  Result Date: 08/21/2017 CLINICAL DATA:  66 year old female with right upper quadrant abdominal pain for 3-4 days. EXAM: ULTRASOUND ABDOMEN LIMITED RIGHT UPPER QUADRANT COMPARISON:  CT Abdomen and Pelvis 09/25/2015 FINDINGS: Gallbladder: Gallbladder wall thickness is normal at 1 to 2 mm. No pericholecystic fluid. No shadowing stones, but some dependent gallbladder sludge is suspected. No sonographic Murphy sign elicited. Common bile duct: Diameter: 2 mm , normal Liver: Generalized increased liver echogenicity (image 43). A 15 mm simple appearing cyst in the right hepatic lobe probably corresponds to that seen near the gallbladder fossa on the 2017 CT. No other discrete liver lesion. No intrahepatic biliary ductal dilatation.  Portal vein is patent on color Doppler imaging with normal direction of blood flow towards the liver. Other findings: Negative visible right kidney. IMPRESSION: 1. Gallbladder sludge but negative for cholelithiasis or cholecystitis. No evidence of biliary obstruction. 2. Fatty liver disease. Electronically Signed   By: Genevie Ann M.D.   On: 08/21/2017 17:42    Procedures Procedures (including critical care time)  Medications Ordered in ED Medications  ondansetron (ZOFRAN-ODT) disintegrating tablet 4 mg (not administered)     Initial Impression / Assessment and Plan / ED Course  I have reviewed the triage vital signs and the nursing notes.  Pertinent labs & imaging results that were available during my care of the patient were reviewed by me and considered in my medical decision making (see chart for details).     Patient is a 66 year old female presenting with worsening abdominal pain. She states for the last 2 months she's had intermittent abdominal pain, burning, reflux type symptoms but followed up with GI and had an endoscopy about a month ago which showed no evidence of peptic ulcer disease or esophageal ulcer which she had in the past. Her pantoprazole was increased but she states over the last week she's had worsening pain in the entire upper abdomen that radiates into her chest. It had been intermittent but last night lasted all night and she had difficulty sleeping. Eating definitely makes it worse. Positioning does not seem to affect it. Today and because her symptoms were not improving she came here for further care. She states that the doctor had planned on doing an ultrasound to evaluate the gallbladder and several other tests but they had never gotten done. She denies any shortness of breath or chest pain currently. History of atrial fibrillation on Xarelto but no cardiac history otherwise.  No significant EKG findings. Low suspicion for cardiac or respiratory cause of her pain.  Concern for possible cholelithiasis, pancreatitis or hepatitis. Lower suspicion for diverticulitis or appendicitis based on where her pain was located. Currently her abdomen is soft and nontender. CBC, CMP, lipase pending. Right upper quadrant ultrasound desired  6:59 PM Labs within normal limits. Ultrasound showing gallbladder sludge but no other acute abnormalities. On reevaluation patient states the pain is starting to slightly come back. It was not improved by a GI cocktail. Gallbladder sludge in the gallbladder may be the patient's tissue however discussed with her she would need to follow-up with GI and possibly need HIDA scan.  Patient is currently stable and feel that she is safe for discharge. She was given some pain medication and nausea medication to take when necessary.  Final Clinical Impressions(s) / ED Diagnoses   Final diagnoses:  Abdominal pain  Epigastric pain  Gallbladder sludge    ED Discharge Orders        Ordered    ondansetron (ZOFRAN ODT) 4 MG disintegrating tablet  Every 8 hours PRN     08/21/17 1901    HYDROcodone-acetaminophen (NORCO/VICODIN) 5-325 MG tablet  Every 4 hours PRN     08/21/17 1901       Blanchie Dessert, MD 08/21/17 1901

## 2017-08-22 DIAGNOSIS — K760 Fatty (change of) liver, not elsewhere classified: Secondary | ICD-10-CM | POA: Diagnosis not present

## 2017-08-22 DIAGNOSIS — K210 Gastro-esophageal reflux disease with esophagitis: Secondary | ICD-10-CM | POA: Diagnosis not present

## 2017-08-22 DIAGNOSIS — K828 Other specified diseases of gallbladder: Secondary | ICD-10-CM | POA: Diagnosis not present

## 2017-08-22 DIAGNOSIS — R1010 Upper abdominal pain, unspecified: Secondary | ICD-10-CM | POA: Diagnosis not present

## 2017-08-23 DIAGNOSIS — I10 Essential (primary) hypertension: Secondary | ICD-10-CM | POA: Diagnosis not present

## 2017-08-23 DIAGNOSIS — K210 Gastro-esophageal reflux disease with esophagitis: Secondary | ICD-10-CM | POA: Diagnosis not present

## 2017-08-23 DIAGNOSIS — K208 Other esophagitis: Secondary | ICD-10-CM | POA: Diagnosis not present

## 2017-08-23 DIAGNOSIS — R1013 Epigastric pain: Secondary | ICD-10-CM | POA: Diagnosis not present

## 2017-08-23 DIAGNOSIS — R1011 Right upper quadrant pain: Secondary | ICD-10-CM | POA: Diagnosis not present

## 2017-08-23 DIAGNOSIS — K760 Fatty (change of) liver, not elsewhere classified: Secondary | ICD-10-CM | POA: Diagnosis not present

## 2017-08-23 DIAGNOSIS — K2210 Ulcer of esophagus without bleeding: Secondary | ICD-10-CM | POA: Diagnosis not present

## 2017-08-23 DIAGNOSIS — Z9889 Other specified postprocedural states: Secondary | ICD-10-CM | POA: Diagnosis not present

## 2017-08-29 DIAGNOSIS — H35422 Microcystoid degeneration of retina, left eye: Secondary | ICD-10-CM | POA: Diagnosis not present

## 2017-08-29 DIAGNOSIS — H3582 Retinal ischemia: Secondary | ICD-10-CM | POA: Diagnosis not present

## 2017-08-29 DIAGNOSIS — H43812 Vitreous degeneration, left eye: Secondary | ICD-10-CM | POA: Diagnosis not present

## 2017-08-29 DIAGNOSIS — H34831 Tributary (branch) retinal vein occlusion, right eye, with macular edema: Secondary | ICD-10-CM | POA: Diagnosis not present

## 2017-09-11 ENCOUNTER — Other Ambulatory Visit: Payer: Self-pay | Admitting: Cardiology

## 2017-09-27 ENCOUNTER — Telehealth: Payer: Self-pay | Admitting: Cardiology

## 2017-09-27 NOTE — Telephone Encounter (Signed)
Spoke with patient and discussed options including Warfarin. Patient does not want to go on Warfarin.  Advised can leave samples throughout the year for her if she calls and requests them. Xarelto 20 mg Lot #[id number] exp 3/21 #3 at front desk for pick up, patient aware

## 2017-09-27 NOTE — Telephone Encounter (Signed)
Lisa Acosta is calling to find out if there is an alternative for her to take besides the Xarelto . She is needing a medication that is less expensive for her . Please call   Thanks

## 2017-10-02 ENCOUNTER — Ambulatory Visit: Payer: Medicare Other | Admitting: Neurology

## 2017-10-03 ENCOUNTER — Telehealth: Payer: Self-pay | Admitting: Cardiology

## 2017-10-03 DIAGNOSIS — I480 Paroxysmal atrial fibrillation: Secondary | ICD-10-CM

## 2017-10-03 MED ORDER — AMLODIPINE BESYLATE 5 MG PO TABS: 5.0000 mg | ORAL_TABLET | Freq: Every day | ORAL | 2 refills | Status: DC

## 2017-10-03 MED ORDER — AMLODIPINE BESYLATE 5 MG PO TABS: 5.0000 mg | ORAL_TABLET | Freq: Every day | ORAL | 0 refills | Status: DC

## 2017-10-03 NOTE — Telephone Encounter (Signed)
Mrs. Beighley is calling because she is out of town and she forgot her Amlodipine and is wanting to know can she get a partial refill ( the Pharmacy said they cannot refill because its to early ) so she is wanting to know can she go without until the 5th of Feb. Please call

## 2017-10-03 NOTE — Telephone Encounter (Signed)
Rx has been sent to the pharmacy electronically. ° °

## 2017-10-03 NOTE — Telephone Encounter (Signed)
°*  STAT* If patient is at the pharmacy, call can be transferred to refill team.   1. Which medications need to be refilled? (please list name of each medication and dose if known)   amLODipine (NORVASC) 5 MG tablet    2. Which pharmacy/location (including street and city if local pharmacy) is medication to be sent to? [facility] [address] in [place]   [phone]  3. Do they need a 30 day or 90 day supply? Lisa Acosta

## 2017-10-03 NOTE — Telephone Encounter (Signed)
Returned call to patient she stated she is in [place] and forgot her Amlodipine.Advised I will send refill to pharmacy.

## 2017-10-19 DIAGNOSIS — Z79899 Other long term (current) drug therapy: Secondary | ICD-10-CM | POA: Diagnosis not present

## 2017-10-19 DIAGNOSIS — I10 Essential (primary) hypertension: Secondary | ICD-10-CM | POA: Diagnosis not present

## 2017-10-19 DIAGNOSIS — I480 Paroxysmal atrial fibrillation: Secondary | ICD-10-CM | POA: Diagnosis not present

## 2017-10-23 ENCOUNTER — Other Ambulatory Visit: Payer: Self-pay | Admitting: Family Medicine

## 2017-10-23 DIAGNOSIS — I480 Paroxysmal atrial fibrillation: Secondary | ICD-10-CM

## 2017-10-26 ENCOUNTER — Telehealth: Payer: Self-pay | Admitting: Family Medicine

## 2017-10-26 ENCOUNTER — Encounter: Payer: Self-pay | Admitting: Family Medicine

## 2017-10-26 DIAGNOSIS — K219 Gastro-esophageal reflux disease without esophagitis: Secondary | ICD-10-CM

## 2017-10-26 NOTE — Telephone Encounter (Signed)
Please advise 

## 2017-10-26 NOTE — Telephone Encounter (Signed)
Copied from [place] [id number]. Topic: Referral - Request >> Oct 26, 2017 10:10 AM Margot Ables wrote: Reason for CRM: Pt states she has severe esophagitis and has seen Dr. Marin Comment on [facility] in [facility]. Pt has had 2 endoscopies and is scheduled for a 3rd next month. She said she feels like there is no improvement and if anything, she has gotten worse. Pt is requesting referral to a different GI as she doesn't feel taken care of.

## 2017-10-29 ENCOUNTER — Other Ambulatory Visit: Payer: Self-pay | Admitting: Family Medicine

## 2017-10-29 ENCOUNTER — Encounter: Payer: Self-pay | Admitting: Family Medicine

## 2017-10-29 DIAGNOSIS — I480 Paroxysmal atrial fibrillation: Secondary | ICD-10-CM

## 2017-10-29 MED ORDER — FLECAINIDE ACETATE 100 MG PO TABS: 100.0000 mg | ORAL_TABLET | Freq: Two times a day (BID) | ORAL | 3 refills | Status: DC

## 2017-10-29 NOTE — Telephone Encounter (Signed)
Pt is requesting refill on flecainide.

## 2017-10-29 NOTE — Addendum Note (Signed)
Addended by: Lamar Blinks C on: 10/29/2017 04:41 PM   Modules accepted: Orders

## 2017-10-30 ENCOUNTER — Other Ambulatory Visit: Payer: Self-pay | Admitting: Cardiology

## 2017-10-30 DIAGNOSIS — I480 Paroxysmal atrial fibrillation: Secondary | ICD-10-CM

## 2017-10-30 NOTE — Telephone Encounter (Signed)
Rx(s) sent to pharmacy electronically.  

## 2017-11-14 DIAGNOSIS — R1310 Dysphagia, unspecified: Secondary | ICD-10-CM | POA: Diagnosis not present

## 2017-11-14 DIAGNOSIS — K449 Diaphragmatic hernia without obstruction or gangrene: Secondary | ICD-10-CM | POA: Diagnosis not present

## 2017-11-20 DIAGNOSIS — K317 Polyp of stomach and duodenum: Secondary | ICD-10-CM | POA: Diagnosis not present

## 2017-11-20 DIAGNOSIS — Z7901 Long term (current) use of anticoagulants: Secondary | ICD-10-CM | POA: Diagnosis not present

## 2017-11-20 DIAGNOSIS — R1310 Dysphagia, unspecified: Secondary | ICD-10-CM | POA: Diagnosis not present

## 2017-11-20 DIAGNOSIS — K209 Esophagitis, unspecified: Secondary | ICD-10-CM | POA: Diagnosis not present

## 2017-12-09 NOTE — Progress Notes (Signed)
[place] at [facility] [address], [place], [place] [address] [phone]  Date:  12/10/2017   Name:  Lisa Acosta   DOB:  Nov 03, 1950   MRN:  [id number]  PCP:  Darreld Mclean, MD    Chief Complaint: Nausea (c/o of nausea that comes and goes. Pt states that sx's have been present since Fall. )   History of Present Illness:  Lisa Acosta is a 67 y.o. very pleasant female patient who presents with the following:  Here today with concern of nausea for several months  History of a fib, HTN, hyperlipidemia, OSA Xarelto, flecainide, amlodipine, atenolol, lovastatin Last seen by myself in early December when she was having some concerns about memory loss.  We referred her to see neurology but I don't see where she ever got an appt .  Pt reports that appt was scheduled, but then her mother passed away, she was out of town for several weeks and missed her appt   She has noted "severe nausea for quite a while." she thought it might be due to GERD/ esophagitis However she has seen her GI and had 2 upper GI- in December and February and esophagitis seems to be better.  Otherwise no cause of her nausea has been found as of yet.  She would like to see [facility] She notes that her nausea is worse on an empty stomach so she has to keep something on her stomach Carrying snacks in her purse so she has food on hand  No vomiting but "pretty close"  She also notes that her hands will shake when she is feeling more nauseated - she is not quite sure what this means  She did have an [place] of her abd in December-  She was having pain as well as nausea at that time-  IMPRESSION: 1. Gallbladder sludge but negative for cholelithiasis or cholecystitis. No evidence of biliary obstruction. 2. Fatty liver disease.  She has not yet had a HIDA scan  Wt Readings from Last 3 Encounters:  12/10/17 202 lb 12.8 oz (92 kg)  08/21/17 209 lb (94.8 kg)   08/13/17 210 lb 9.6 oz (95.5 kg)   She has lost several lbs over the last few months   About 10 days ago she was walking up a hill and felt like her legs got weak, she did not have LOC but fell to the ground. She had to be helped up the rest of the hill.  No CP or SOB  She did have a negative stress about 6 months ago, so we do not think this was her heart.    Her bowels are regular- she takes a probioitc that does help maintain her regularity   Lab Results  Component Value Date   TSH 1.81 08/13/2017   Lab Results  Component Value Date   HGBA1C 5.8 08/13/2017   She does have pre-diabetes History of paroxysmal a fib, her cardiologist is DR. Crenshaw and electrophis is Dr. Kennedy Bucker at [facility] She is on flecainide for rhythm control From last note 2/19: 1). Atrial fibrillation -paroxysmal. Currently on a rhythm control strategy on flecainide which has worked well for her. This recent exacerbation was in the setting of extreme duress/grief and HTN. BP is back to normal today and she will keep monitoring this. LFTs checked at OSH were normal in December. She had an ETT 06/2017 by Dr. Stanford Breed that was negative for QRS prolongation with  exercise. Will perform echo next visit for eval EF and LV thickness.  2). Anti-coagulation - The patient's current CHADS-VASC risk score is 3 for Hypertension (1), Age 26-74 (1) and Sex = female (1). She is anti-coagulated with rivaroxaban. Her anti-coagulation will be continued.   Patient Active Problem List   Diagnosis Date Noted  . 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 Dyspnea 09/22/2016  . OSA (obstructive sleep apnea) 08/31/2016  . PAF (paroxysmal atrial fibrillation) ([place]) 03/16/2016  . Hypothyroid 03/16/2016  . Obesity (BMI 30-39.9) 03/16/2016  . Atrial fibrillation ([place]) 01/05/2016  . Palpitations 11/10/2015  . Essential hypertension 11/10/2015  . Hyperlipidemia 11/10/2015    Past Medical History:  Diagnosis Date  . Atrial  fibrillation ([place])   . Depression   . Diverticulitis   . Diverticulosis   . Esophageal ulcer   . GERD (gastroesophageal reflux disease)   . Hyperlipidemia   . Hypertension   . Hypothyroid   . OSA (obstructive sleep apnea) 08/31/2016    Past Surgical History:  Procedure Laterality Date  . CATARACT EXTRACTION    . HAND SURGERY    . KNEE SURGERY    . ROTATOR CUFF REPAIR    . TOTAL ABDOMINAL HYSTERECTOMY      Social History   Tobacco Use  . Smoking status: Never Smoker  . Smokeless tobacco: Never Used  Substance Use Topics  . Alcohol use: Yes    Comment: rare  . Drug use: No    Family History  Problem Relation Age of Onset  . Atrial fibrillation Mother   . Congestive Heart Failure Mother   . Heart disease Father   . Heart disease Brother   . Heart disease Brother     Allergies  Allergen Reactions  . Dextran Anaphylaxis  . Dextrans   . Dextromethorphan Hbr Other (See Comments)    hallucinations  . Tree Extract   . Penicillin G Rash  . Penicillins Rash  . Sulfa Antibiotics Rash  . Sulfacetamide Sodium Rash    Medication list has been reviewed and updated.  Current Outpatient Medications on File Prior to Visit  Medication Sig Dispense Refill  . amLODipine (NORVASC) 5 MG tablet Take 1 tablet (5 mg total) by mouth daily. <PLEASE MAKE APPOINTMENT FOR REFILLS> 30 tablet 0  . atenolol (TENORMIN) 50 MG tablet Take 1 tablet (50 mg total) by mouth daily. 90 tablet 3  . BIOTIN PO Take 1 tablet by mouth daily.    . Cholecalciferol (D 2000) 2000 units TABS Take 1 tablet by mouth daily.    . flecainide (TAMBOCOR) 100 MG tablet Take 1 tablet (100 mg total) by mouth 2 (two) times daily. 180 tablet 3  . fluticasone (FLONASE) 50 MCG/ACT nasal spray Place 2 sprays into both nostrils daily. 16 g 1  . HYDROcodone-acetaminophen (NORCO/VICODIN) 5-325 MG tablet Take 0.5-1 tablets by mouth every 4 (four) hours as needed for severe pain. 10 tablet 0  . Krill Oil 1000 MG CAPS Take 1  capsule by mouth daily.    [name] lactobacillus acidophilus (BACID) TABS tablet Take 1 tablet by mouth daily.    [name] levothyroxine (SYNTHROID, LEVOTHROID) 125 MCG tablet Take 1 tablet (125 mcg total) by mouth daily before breakfast. 90 tablet 3  . lovastatin (MEVACOR) 40 MG tablet Take 40 mg by mouth at bedtime.    . LUTEIN PO Take 1 capsule by mouth daily.    . magnesium 30 MG tablet Take 30 mg by mouth daily.    [name]  omeprazole (PRILOSEC) 40 MG capsule Take 40 mg by mouth 2 (two) times daily.    . ondansetron (ZOFRAN ODT) 4 MG disintegrating tablet Take 1 tablet (4 mg total) by mouth every 8 (eight) hours as needed for nausea or vomiting. 20 tablet 0  . Potassium 75 MG TABS Take 1 tablet by mouth daily.    . traZODone (DESYREL) 50 MG tablet Take 1 tablet (50 mg total) by mouth at bedtime. 90 tablet 3  . Turmeric 450 MG CAPS Take 1 capsule by mouth daily.    [name] 20 MG TABS tablet TAKE 1 TABLET BY MOUTH DAILY WITH SUPPER 90 tablet 1  . XARELTO 20 MG TABS tablet TAKE 1 TABLET DAILY WITH   SUPPER 90 tablet 3   No current facility-administered medications on file prior to visit.     Review of Systems:  As per HPI- otherwise negative.   Physical Examination: Vitals:   12/10/17 1307  BP: 116/82  Pulse: (!) 54  Temp: 98.4 F (36.9 C)  SpO2: 97%   Vitals:   12/10/17 1307  Weight: 202 lb 12.8 oz (92 kg)  Height: 5\' 1"  (1.549 m)   Body mass index is 38.32 kg/m. Ideal Body Weight: Weight in (lb) to have BMI = 25: 132  GEN: WDWN, NAD, Non-toxic, A & O x 3, obese, looks well  HEENT: Atraumatic, Normocephalic. Neck supple. No masses, No LAD. Ears and Nose: No external deformity. CV: RRR- seems to be in sinus at this time, slight bradycardia, No M/G/R. No JVD. No thrill. No extra heart sounds. PULM: CTA B, no wheezes, crackles, rhonchi. No retractions. No resp. distress. No accessory muscle use. ABD: S, NT, ND, +BS. No rebound. No HSM.  Belly is benign- non tender at time of exam  today EXTR: No c/c/e NEURO Normal gait.  PSYCH: Normally interactive. Conversant. Not depressed or anxious appearing.  Calm demeanor.   Pulse Readings from Last 3 Encounters:  12/10/17 (!) 54  08/21/17 (!) 55  08/13/17 (!) 56   We gave her a glucose meter and showed her how to use this so she can check her glucose if she is feeling shaky Assessment and Plan: Nausea - Plan: Ambulatory referral to Gastroenterology  Depression, major, single episode, mild ([place]) - Plan: FLUoxetine (PROZAC) 40 MG capsule  Pre-diabetes  Gallbladder sludge - Plan: Ambulatory referral to Gastroenterology  Nausea for several months, she had an upper GI already which was unrevealing. She does have known gallbladder sludge.  She would like to see [facility], and I will make this referral for her  She needs a refill of her prozac, did for her today She noted a few episodes where she felt like her blood sugar was low- she now has a meter that she can use the check her glucose in this situation She will let me know if any worsening while we await her GI referral   Signed Lamar Blinks, MD

## 2017-12-10 ENCOUNTER — Ambulatory Visit (INDEPENDENT_AMBULATORY_CARE_PROVIDER_SITE_OTHER): Payer: Medicare Other | Admitting: Family Medicine

## 2017-12-10 ENCOUNTER — Encounter: Payer: Self-pay | Admitting: Family Medicine

## 2017-12-10 VITALS — BP 116/82 | HR 54 | Temp 98.4°F | Ht 61.0 in | Wt 202.8 lb

## 2017-12-10 DIAGNOSIS — K828 Other specified diseases of gallbladder: Secondary | ICD-10-CM | POA: Diagnosis not present

## 2017-12-10 DIAGNOSIS — R7303 Prediabetes: Secondary | ICD-10-CM

## 2017-12-10 DIAGNOSIS — F320 Major depressive disorder, single episode, mild: Secondary | ICD-10-CM | POA: Diagnosis not present

## 2017-12-10 DIAGNOSIS — R110 Nausea: Secondary | ICD-10-CM | POA: Diagnosis not present

## 2017-12-10 MED ORDER — FLUOXETINE HCL 40 MG PO CAPS: 40.0000 mg | ORAL_CAPSULE | Freq: Two times a day (BID) | ORAL | 3 refills | Status: DC

## 2017-12-10 NOTE — Patient Instructions (Addendum)
Good to see you today!  I am going to set you up to see [facility]; I do think that you may need a HIDA scan to get more info about your gallbladder  Try checking your blood sugar when you feel shaky or bad- let me know if you determine any sort of pattern here.   The average person might start to feel symptoms of low blood sugar at a glucose of around 60- 70  Please let me know if any changes or worsening

## 2017-12-14 ENCOUNTER — Other Ambulatory Visit: Payer: Self-pay | Admitting: Family Medicine

## 2017-12-14 DIAGNOSIS — G4700 Insomnia, unspecified: Secondary | ICD-10-CM

## 2017-12-14 DIAGNOSIS — M1712 Unilateral primary osteoarthritis, left knee: Secondary | ICD-10-CM | POA: Diagnosis not present

## 2017-12-14 DIAGNOSIS — M1711 Unilateral primary osteoarthritis, right knee: Secondary | ICD-10-CM | POA: Diagnosis not present

## 2017-12-14 DIAGNOSIS — M170 Bilateral primary osteoarthritis of knee: Secondary | ICD-10-CM | POA: Diagnosis not present

## 2017-12-14 NOTE — Telephone Encounter (Signed)
Received medication refill request for traZODone (DESYREL) 50 mg tablet. Last office visit 12/10/17 and last refill 01/22/17. Please advise.

## 2017-12-18 ENCOUNTER — Telehealth: Payer: Self-pay | Admitting: *Deleted

## 2017-12-18 NOTE — Telephone Encounter (Signed)
Received Physician Orders supplies OSA from [facility]; forwarded to provider/SLS 04/09

## 2017-12-20 ENCOUNTER — Encounter: Payer: Self-pay | Admitting: Family Medicine

## 2017-12-24 ENCOUNTER — Encounter: Payer: Self-pay | Admitting: Family Medicine

## 2017-12-25 DIAGNOSIS — H35422 Microcystoid degeneration of retina, left eye: Secondary | ICD-10-CM | POA: Diagnosis not present

## 2017-12-25 DIAGNOSIS — H34831 Tributary (branch) retinal vein occlusion, right eye, with macular edema: Secondary | ICD-10-CM | POA: Diagnosis not present

## 2017-12-25 DIAGNOSIS — H3582 Retinal ischemia: Secondary | ICD-10-CM | POA: Diagnosis not present

## 2017-12-28 ENCOUNTER — Encounter: Payer: Self-pay | Admitting: Family Medicine

## 2018-01-08 NOTE — Progress Notes (Signed)
Subjective:   Lisa Acosta is a 67 y.o. female who presents for an Initial Medicare Annual Wellness Visit.  Review of Systems   No ROS.  Medicare Wellness Visit. Additional risk factors are reflected in the social history. Cardiac Risk Factors include: advanced age (>35men, >78 women);hypertension;dyslipidemia;sedentary lifestyle;obesity (BMI >30kg/m2) Sleep patterns: Takes Trazodone. Generally sleeps 11p-8a. Still feels tired.  Naps occasionally. Home Safety/Smoke Alarms: Feels safe in home. Smoke alarms in place.  Living environment; residence and Firearm Safety: Lives with husband in basement apt of daughter's house. Seat Belt Safety/Bike Helmet: Wears seat belt.   Female:     Mammo-ordered Dexa scan- ordered today.        CCS-2014    Objective:    Today's Vitals   01/11/18 0949  BP: 119/83  Pulse: (!) 52  SpO2: 97%  Weight: 200 lb 6.4 oz (90.9 kg)  Height: 5\' 1"  (1.549 m)   Body mass index is 37.87 kg/m.  Advanced Directives 01/11/2018 08/21/2017 03/13/2016 02/11/2016 10/31/2015  Does Patient Have a Medical Advance Directive? Yes No No No No  Type of Paramedic of [place];Living will - - - -  Copy of [place] in Chart? No - copy requested - - - -  Would patient like information on creating a medical advance directive? - - - No - patient declined information -    Current Medications (verified) Outpatient Encounter Medications as of 01/11/2018  Medication Sig  . amLODipine (NORVASC) 5 MG tablet Take 1 tablet (5 mg total) by mouth daily. <PLEASE MAKE APPOINTMENT FOR REFILLS>  . atenolol (TENORMIN) 50 MG tablet Take 1 tablet (50 mg total) by mouth daily.  [name] BIOTIN PO Take 1 tablet by mouth daily.  . Cholecalciferol (D 2000) 2000 units TABS Take 1 tablet by mouth daily.  . flecainide (TAMBOCOR) 100 MG tablet Take 1 tablet (100 mg total) by mouth 2 (two) times daily.  [name] FLUoxetine (PROZAC) 40 MG capsule Take 1 capsule (40 mg total)  by mouth 2 (two) times daily.  Javier Docker Oil 1000 MG CAPS Take 1 capsule by mouth daily.  [name] lactobacillus acidophilus (BACID) TABS tablet Take 1 tablet by mouth daily.  [name] levothyroxine (SYNTHROID, LEVOTHROID) 125 MCG tablet Take 1 tablet (125 mcg total) by mouth daily before breakfast.  . lovastatin (MEVACOR) 40 MG tablet Take 40 mg by mouth at bedtime.  . LUTEIN PO Take 1 capsule by mouth daily.  . magnesium 30 MG tablet Take 30 mg by mouth daily.  [name] omeprazole (PRILOSEC) 40 MG capsule Take 40 mg by mouth 2 (two) times daily.  . Potassium 75 MG TABS Take 1 tablet by mouth daily.  . traZODone (DESYREL) 50 MG tablet Take 1 tablet (50 mg total) by mouth at bedtime.  . Turmeric 450 MG CAPS Take 1 capsule by mouth daily.  [name] 20 MG TABS tablet TAKE 1 TABLET DAILY WITH   SUPPER  . fluticasone (FLONASE) 50 MCG/ACT nasal spray Place 2 sprays into both nostrils daily. (Patient not taking: Reported on 01/11/2018)  . HYDROcodone-acetaminophen (NORCO/VICODIN) 5-325 MG tablet Take 0.5-1 tablets by mouth every 4 (four) hours as needed for severe pain. (Patient not taking: Reported on 01/11/2018)  . ondansetron (ZOFRAN ODT) 4 MG disintegrating tablet Take 1 tablet (4 mg total) by mouth every 8 (eight) hours as needed for nausea or vomiting. (Patient not taking: Reported on 01/11/2018)  . [DISCONTINUED] traZODone (DESYREL) 50 MG tablet TAKE 1 TABLET AT BEDTIME  . [  DISCONTINUED] XARELTO 20 MG TABS tablet TAKE 1 TABLET BY MOUTH DAILY WITH SUPPER   No facility-administered encounter medications on file as of 01/11/2018.     Allergies (verified) Dextran; Dextrans; Dextromethorphan hbr; Tree extract; Penicillin g; Penicillins; Sulfa antibiotics; and Sulfacetamide sodium   History: Past Medical History:  Diagnosis Date  . Atrial fibrillation ([place])   . Depression   . Diverticulitis   . Diverticulosis   . Esophageal ulcer   . GERD (gastroesophageal reflux disease)   . Hyperlipidemia   . Hypertension   .  Hypothyroid   . OSA (obstructive sleep apnea) 08/31/2016   Past Surgical History:  Procedure Laterality Date  . CATARACT EXTRACTION    . HAND SURGERY    . KNEE SURGERY    . ROTATOR CUFF REPAIR    . TOTAL ABDOMINAL HYSTERECTOMY     Family History  Problem Relation Age of Onset  . Atrial fibrillation Mother   . Congestive Heart Failure Mother   . Heart disease Father   . Heart disease Brother   . Heart disease Brother    Social History   Socioeconomic History  . Marital status: Married    Spouse name: Jenny Reichmann  . Number of children: 1  . Years of education: College  . Highest education level: Not on file  Occupational History  . Occupation: Conservator, museum/gallery: OTHER    Comment: [employer]  . Financial resource strain: Not on file  . Food insecurity:    Worry: Not on file    Inability: Not on file  . Transportation needs:    Medical: Not on file    Non-medical: Not on file  Tobacco Use  . Smoking status: Never Smoker  . Smokeless tobacco: Never Used  Substance and Sexual Activity  . Alcohol use: Yes    Comment: rare  . Drug use: No  . Sexual activity: Not Currently  Lifestyle  . Physical activity:    Days per week: Not on file    Minutes per session: Not on file  . Stress: Not on file  Relationships  . Social connections:    Talks on phone: Not on file    Gets together: Not on file    Attends religious service: Not on file    Active member of club or organization: Not on file    Attends meetings of clubs or organizations: Not on file    Relationship status: Not on file  Other Topics Concern  . Not on file  Social History Narrative   Patient lives at home spouse.   Caffeine use: 2 sodas weelky    Tobacco Counseling Counseling given: Not Answered   Clinical Intake: Pain : No/denies pain     Activities of Daily Living In your present state of health, do you have any difficulty performing the following activities:  01/11/2018 01/22/2017  Hearing? N Y  Comment has hearing aids. not wearing them.  Uses hearing aids  Vision? N Y  Comment wears reading glasses. hx cataract sx. Dr.Sanders for retina specialty. Had cataract surgery 2 years ago  Difficulty concentrating or making decisions? N N  Walking or climbing stairs? N Y  Comment - OOB  Dressing or bathing? N Y  Comment - Arthritis in hands  Doing errands, shopping? N N  Preparing Food and eating ? N -  Using the Toilet? N -  In the past six months, have you accidently leaked urine? Y -  Comment wears liner -  Do you have problems with loss of bowel control? N -  Managing your Medications? N -  Managing your Finances? N -  Housekeeping or managing your Housekeeping? N -  Some recent data might be hidden     Immunizations and Health Maintenance Immunization History  Administered Date(s) Administered  . Influenza, High Dose Seasonal PF 06/14/2016  . Influenza,inj,Quad PF,6+ Mos 06/11/2014  . Pneumococcal Conjugate-13 08/07/2016   Health Maintenance Due  Topic Date Due  . TETANUS/TDAP  04/22/1969  . PNA vac Low Risk Adult (2 of 2 - PPSV23) 08/07/2017  . MAMMOGRAM  11/23/2017    Patient Care Team: Copland, Gay Filler, MD as PCP - General (Family Medicine)  Indicate any recent Medical Services you may have received from other than [facility] providers in the past year (date may be approximate).     Assessment:   This is a routine wellness examination for Aubrielle. Physical assessment deferred to PCP.'  Hearing/Vision screen  Visual Acuity Screening   Right eye Left eye Both eyes  Without correction: 20/25 20/25 20/25   With correction:     Hearing Screening Comments: Pt states she has hearing aids but is not wearing them.   Dietary issues and exercise activities discussed: Current Exercise Habits: The patient does not participate in regular exercise at present, Exercise limited by: None identified Diet (meal preparation, eat out, water  intake, caffeinated beverages, dairy products, fruits and vegetables): well balanced, on average, 3 meals per day  Goals    . Increase physical activity     Do recumbent bike or outdoor pool at least 3x/ week.      Depression Screen PHQ 2/9 Scores 01/11/2018 01/22/2017 02/24/2016 12/08/2015  PHQ - 2 Score 0 0 0 2  PHQ- 9 Score - - - 5  Exception Documentation - Patient refusal - -    Fall Risk Fall Risk  01/11/2018 01/22/2017 02/24/2016 12/08/2015  Falls in the past year? Yes No No No  Number falls in past yr: 2 or more - - -  Risk for fall due to : (No Data) - - -  Risk for fall due to: Comment pt states her legs seem weak - - -  Follow up Falls prevention discussed;Education provided - - -     Cognitive Function: Ad8 score reviewed for issues:  Issues making decisions:no  Less interest in hobbies / activities:no  Repeats questions, stories (family complaining):no  Trouble using ordinary gadgets (microwave, computer, phone):no  Forgets the month or year: no  Mismanaging finances: no  Remembering appts:no Daily problems with thinking and/or memory:no Ad8 score is=0     MMSE - Mini Mental State Exam 08/13/2017  Orientation to time 5  Orientation to Place 5  Registration 3  Attention/ Calculation 5  Recall 3  Language- name 2 objects 2  Language- repeat 1  Language- follow 3 step command 3  Language- read & follow direction 1  Write a sentence 1  Copy design 1  Total score 30        Screening Tests Health Maintenance  Topic Date Due  . TETANUS/TDAP  04/22/1969  . PNA vac Low Risk Adult (2 of 2 - PPSV23) 08/07/2017  . MAMMOGRAM  11/23/2017  . INFLUENZA VACCINE  04/11/2018  . COLONOSCOPY  09/12/2023  . DEXA SCAN  Completed  . Hepatitis C Screening  Completed      Plan:   Follow up with Dr.Coplalnd as scheduled.   Continue to eat  heart healthy diet (full of fruits, vegetables, whole grains, lean protein, water--limit salt, fat, and sugar intake) and  increase physical activity as tolerated.  Continue doing brain stimulating activities (puzzles, reading, adult coloring books, staying active) to keep memory sharp.   Bring a copy of your living will and/or healthcare power of attorney to your next office visit.  Try One pan sheet dinners for healthy easy lunch/ dinner options.  I have personally reviewed and noted the following in the patient's chart:   . Medical and social history . Use of alcohol, tobacco or illicit drugs  . Current medications and supplements . Functional ability and status . Nutritional status . Physical activity . Advanced directives . List of other physicians . Hospitalizations, surgeries, and ER visits in previous 12 months . Vitals . Screenings to include cognitive, depression, and falls . Referrals and appointments  In addition, I have reviewed and discussed with patient certain preventive protocols, quality metrics, and best practice recommendations. A written personalized care plan for preventive services as well as general preventive health recommendations were provided to patient.     Shela Nevin, [place]   01/11/2018

## 2018-01-11 ENCOUNTER — Telehealth: Payer: Self-pay | Admitting: *Deleted

## 2018-01-11 ENCOUNTER — Ambulatory Visit (INDEPENDENT_AMBULATORY_CARE_PROVIDER_SITE_OTHER): Payer: Medicare Other | Admitting: *Deleted

## 2018-01-11 ENCOUNTER — Encounter: Payer: Self-pay | Admitting: *Deleted

## 2018-01-11 VITALS — BP 119/83 | HR 52 | Ht 61.0 in | Wt 200.4 lb

## 2018-01-11 DIAGNOSIS — Z1239 Encounter for other screening for malignant neoplasm of breast: Secondary | ICD-10-CM

## 2018-01-11 DIAGNOSIS — Z0000 Encounter for general adult medical examination without abnormal findings: Secondary | ICD-10-CM | POA: Diagnosis not present

## 2018-01-11 DIAGNOSIS — Z1231 Encounter for screening mammogram for malignant neoplasm of breast: Secondary | ICD-10-CM

## 2018-01-11 DIAGNOSIS — Z780 Asymptomatic menopausal state: Secondary | ICD-10-CM

## 2018-01-11 NOTE — Progress Notes (Signed)
Noted reviewed and agree.  Nance Pear NP

## 2018-01-11 NOTE — Patient Instructions (Addendum)
Follow up with Dr.Coplalnd as scheduled.   Continue to eat heart healthy diet (full of fruits, vegetables, whole grains, lean protein, water--limit salt, fat, and sugar intake) and increase physical activity as tolerated.  Continue doing brain stimulating activities (puzzles, reading, adult coloring books, staying active) to keep memory sharp.   Bring a copy of your living will and/or healthcare power of attorney to your next office visit.  Try One pan sheet dinners for healthy easy lunch/ dinner options.   Lisa Acosta , Thank you for taking time to come for your Medicare Wellness Visit. I appreciate your ongoing commitment to your health goals. Please review the following plan we discussed and let me know if I can assist you in the future.   These are the goals we discussed: Goals    . Increase physical activity     Do recumbent bike or outdoor pool at least 3x/ week.       This is a list of the screening recommended for you and due dates:  Health Maintenance  Topic Date Due  . Tetanus Vaccine  04/22/1969  . Pneumonia vaccines (2 of 2 - PPSV23) 08/07/2017  . Mammogram  11/23/2017  . Flu Shot  04/11/2018  . Colon Cancer Screening  09/12/2023  . DEXA scan (bone density measurement)  Completed  .  Hepatitis C: One time screening is recommended by Center for Disease Control  (CDC) for  adults born from [birth year] through 1966.   Completed    Health Maintenance for Postmenopausal Women Menopause is a normal process in which your reproductive ability comes to an end. This process happens gradually over a span of months to years, usually between the ages of 38 and 2. Menopause is complete when you have missed 12 consecutive menstrual periods. It is important to talk with your health care provider about some of the most common conditions that affect postmenopausal women, such as heart disease, cancer, and bone loss (osteoporosis). Adopting a healthy lifestyle and getting preventive care can  help to promote your health and wellness. Those actions can also lower your chances of developing some of these common conditions. What should I know about menopause? During menopause, you may experience a number of symptoms, such as:  Moderate-to-severe hot flashes.  Night sweats.  Decrease in sex drive.  Mood swings.  Headaches.  Tiredness.  Irritability.  Memory problems.  Insomnia.  Choosing to treat or not to treat menopausal changes is an individual decision that you make with your health care provider. What should I know about hormone replacement therapy and supplements? Hormone therapy products are effective for treating symptoms that are associated with menopause, such as hot flashes and night sweats. Hormone replacement carries certain risks, especially as you become older. If you are thinking about using estrogen or estrogen with progestin treatments, discuss the benefits and risks with your health care provider. What should I know about heart disease and stroke? Heart disease, heart attack, and stroke become more likely as you age. This may be due, in part, to the hormonal changes that your body experiences during menopause. These can affect how your body processes dietary fats, triglycerides, and cholesterol. Heart attack and stroke are both medical emergencies. There are many things that you can do to help prevent heart disease and stroke:  Have your blood pressure checked at least every 1-2 years. High blood pressure causes heart disease and increases the risk of stroke.  If you are 32-47 years old, ask your health care  provider if you should take aspirin to prevent a heart attack or a stroke.  Do not use any tobacco products, including cigarettes, chewing tobacco, or electronic cigarettes. If you need help quitting, ask your health care provider.  It is important to eat a healthy diet and maintain a healthy weight. ? Be sure to include plenty of vegetables, fruits,  low-fat dairy products, and lean protein. ? Avoid eating foods that are high in solid fats, added sugars, or salt (sodium).  Get regular exercise. This is one of the most important things that you can do for your health. ? Try to exercise for at least 150 minutes each week. The type of exercise that you do should increase your heart rate and make you sweat. This is known as moderate-intensity exercise. ? Try to do strengthening exercises at least twice each week. Do these in addition to the moderate-intensity exercise.  Know your numbers.Ask your health care provider to check your cholesterol and your blood glucose. Continue to have your blood tested as directed by your health care provider.  What should I know about cancer screening? There are several types of cancer. Take the following steps to reduce your risk and to catch any cancer development as early as possible. Breast Cancer  Practice breast self-awareness. ? This means understanding how your breasts normally appear and feel. ? It also means doing regular breast self-exams. Let your health care provider know about any changes, no matter how small.  If you are 64 or older, have a clinician do a breast exam (clinical breast exam or CBE) every year. Depending on your age, family history, and medical history, it may be recommended that you also have a yearly breast X-ray (mammogram).  If you have a family history of breast cancer, talk with your health care provider about genetic screening.  If you are at high risk for breast cancer, talk with your health care provider about having an MRI and a mammogram every year.  Breast cancer (BRCA) gene test is recommended for women who have family members with BRCA-related cancers. Results of the assessment will determine the need for genetic counseling and BRCA1 and for BRCA2 testing. BRCA-related cancers include these types: ? Breast. This occurs in males or females. ? Ovarian. ? Tubal. This  may also be called fallopian tube cancer. ? Cancer of the abdominal or pelvic lining (peritoneal cancer). ? Prostate. ? Pancreatic.  Cervical, Uterine, and Ovarian Cancer Your health care provider may recommend that you be screened regularly for cancer of the pelvic organs. These include your ovaries, uterus, and vagina. This screening involves a pelvic exam, which includes checking for microscopic changes to the surface of your cervix (Pap test).  For women ages 21-65, health care providers may recommend a pelvic exam and a Pap test every three years. For women ages 4-65, they may recommend the Pap test and pelvic exam, combined with testing for human papilloma virus (HPV), every five years. Some types of HPV increase your risk of cervical cancer. Testing for HPV may also be done on women of any age who have unclear Pap test results.  Other health care providers may not recommend any screening for nonpregnant women who are considered low risk for pelvic cancer and have no symptoms. Ask your health care provider if a screening pelvic exam is right for you.  If you have had past treatment for cervical cancer or a condition that could lead to cancer, you need Pap tests and screening for  cancer for at least 20 years after your treatment. If Pap tests have been discontinued for you, your risk factors (such as having a new sexual partner) need to be reassessed to determine if you should start having screenings again. Some women have medical problems that increase the chance of getting cervical cancer. In these cases, your health care provider may recommend that you have screening and Pap tests more often.  If you have a family history of uterine cancer or ovarian cancer, talk with your health care provider about genetic screening.  If you have vaginal bleeding after reaching menopause, tell your health care provider.  There are currently no reliable tests available to screen for ovarian cancer.  Lung  Cancer Lung cancer screening is recommended for adults 55-80 years old who are at high risk for lung cancer because of a history of smoking. A yearly low-dose CT scan of the lungs is recommended if you:  Currently smoke.  Have a history of at least 30 pack-years of smoking and you currently smoke or have quit within the past 15 years. A pack-year is smoking an average of one pack of cigarettes per day for one year.  Yearly screening should:  Continue until it has been 15 years since you quit.  Stop if you develop a health problem that would prevent you from having lung cancer treatment.  Colorectal Cancer  This type of cancer can be detected and can often be prevented.  Routine colorectal cancer screening usually begins at age 50 and continues through age 75.  If you have risk factors for colon cancer, your health care provider may recommend that you be screened at an earlier age.  If you have a family history of colorectal cancer, talk with your health care provider about genetic screening.  Your health care provider may also recommend using home test kits to check for hidden blood in your stool.  A small camera at the end of a tube can be used to examine your colon directly (sigmoidoscopy or colonoscopy). This is done to check for the earliest forms of colorectal cancer.  Direct examination of the colon should be repeated every 5-10 years until age 75. However, if early forms of precancerous polyps or small growths are found or if you have a family history or genetic risk for colorectal cancer, you may need to be screened more often.  Skin Cancer  Check your skin from head to toe regularly.  Monitor any moles. Be sure to tell your health care provider: ? About any new moles or changes in moles, especially if there is a change in a mole's shape or color. ? If you have a mole that is larger than the size of a pencil eraser.  If any of your family members has a history of skin  cancer, especially at a young age, talk with your health care provider about genetic screening.  Always use sunscreen. Apply sunscreen liberally and repeatedly throughout the day.  Whenever you are outside, protect yourself by wearing long sleeves, pants, a wide-brimmed hat, and sunglasses.  What should I know about osteoporosis? Osteoporosis is a condition in which bone destruction happens more quickly than new bone creation. After menopause, you may be at an increased risk for osteoporosis. To help prevent osteoporosis or the bone fractures that can happen because of osteoporosis, the following is recommended:  If you are 19-50 years old, get at least 1,000 mg of calcium and at least 600 mg of vitamin D per   day.  If you are older than age 50 but younger than age 70, get at least 1,200 mg of calcium and at least 600 mg of vitamin D per day.  If you are older than age 70, get at least 1,200 mg of calcium and at least 800 mg of vitamin D per day.  Smoking and excessive alcohol intake increase the risk of osteoporosis. Eat foods that are rich in calcium and vitamin D, and do weight-bearing exercises several times each week as directed by your health care provider. What should I know about how menopause affects my mental health? Depression may occur at any age, but it is more common as you become older. Common symptoms of depression include:  Low or sad mood.  Changes in sleep patterns.  Changes in appetite or eating patterns.  Feeling an overall lack of motivation or enjoyment of activities that you previously enjoyed.  Frequent crying spells.  Talk with your health care provider if you think that you are experiencing depression. What should I know about immunizations? It is important that you get and maintain your immunizations. These include:  Tetanus, diphtheria, and pertussis (Tdap) booster vaccine.  Influenza every year before the flu season begins.  Pneumonia  vaccine.  Shingles vaccine.  Your health care provider may also recommend other immunizations. This information is not intended to replace advice given to you by your health care provider. Make sure you discuss any questions you have with your health care provider. Document Released: 10/20/2005 Document Revised: 03/17/2016 Document Reviewed: 06/01/2015 Elsevier Interactive Patient Education  2018 Elsevier Inc.  

## 2018-01-15 ENCOUNTER — Encounter: Payer: Self-pay | Admitting: Cardiology

## 2018-01-15 ENCOUNTER — Encounter: Payer: Self-pay | Admitting: Family Medicine

## 2018-01-15 DIAGNOSIS — E034 Atrophy of thyroid (acquired): Secondary | ICD-10-CM

## 2018-01-15 MED ORDER — LEVOTHYROXINE SODIUM 125 MCG PO TABS: 125.0000 ug | ORAL_TABLET | Freq: Every day | ORAL | 1 refills | Status: DC

## 2018-01-17 ENCOUNTER — Encounter: Payer: Self-pay | Admitting: *Deleted

## 2018-01-17 NOTE — Telephone Encounter (Signed)
Patient was in for [facility] and dropped off new disability paperwork to continue concurrent LOA, will complete as much as possible, then forward to provider/SLS 05/09

## 2018-01-18 NOTE — Telephone Encounter (Signed)
Patient is out of work on full disability; completed paperwork forwarded to provider for signature/SLS 05/10

## 2018-01-21 ENCOUNTER — Other Ambulatory Visit (HOSPITAL_BASED_OUTPATIENT_CLINIC_OR_DEPARTMENT_OTHER): Payer: Medicare Other

## 2018-01-21 ENCOUNTER — Ambulatory Visit (HOSPITAL_BASED_OUTPATIENT_CLINIC_OR_DEPARTMENT_OTHER): Payer: Medicare Other

## 2018-01-21 NOTE — Telephone Encounter (Signed)
Paperwork faxed to [facility]/SLS 05/13

## 2018-01-22 ENCOUNTER — Other Ambulatory Visit: Payer: Self-pay

## 2018-01-22 ENCOUNTER — Encounter (HOSPITAL_BASED_OUTPATIENT_CLINIC_OR_DEPARTMENT_OTHER): Payer: Self-pay

## 2018-01-22 ENCOUNTER — Emergency Department (HOSPITAL_BASED_OUTPATIENT_CLINIC_OR_DEPARTMENT_OTHER)
Admission: EM | Admit: 2018-01-22 | Discharge: 2018-01-22 | Disposition: A | Discharge status: Home / Self Care | Payer: Medicare Other | Attending: Emergency Medicine | Admitting: Emergency Medicine

## 2018-01-22 ENCOUNTER — Emergency Department (HOSPITAL_BASED_OUTPATIENT_CLINIC_OR_DEPARTMENT_OTHER): Payer: Medicare Other

## 2018-01-22 ENCOUNTER — Ambulatory Visit: Payer: Self-pay | Admitting: *Deleted

## 2018-01-22 DIAGNOSIS — Z7901 Long term (current) use of anticoagulants: Secondary | ICD-10-CM | POA: Diagnosis not present

## 2018-01-22 DIAGNOSIS — Y939 Activity, unspecified: Secondary | ICD-10-CM | POA: Insufficient documentation

## 2018-01-22 DIAGNOSIS — S80212A Abrasion, left knee, initial encounter: Secondary | ICD-10-CM | POA: Diagnosis not present

## 2018-01-22 DIAGNOSIS — S0990XA Unspecified injury of head, initial encounter: Secondary | ICD-10-CM | POA: Diagnosis not present

## 2018-01-22 DIAGNOSIS — S0081XA Abrasion of other part of head, initial encounter: Secondary | ICD-10-CM | POA: Insufficient documentation

## 2018-01-22 DIAGNOSIS — S0993XA Unspecified injury of face, initial encounter: Secondary | ICD-10-CM | POA: Diagnosis present

## 2018-01-22 DIAGNOSIS — S098XXA Other specified injuries of head, initial encounter: Secondary | ICD-10-CM | POA: Diagnosis not present

## 2018-01-22 DIAGNOSIS — Y929 Unspecified place or not applicable: Secondary | ICD-10-CM | POA: Diagnosis not present

## 2018-01-22 DIAGNOSIS — S0003XA Contusion of scalp, initial encounter: Secondary | ICD-10-CM | POA: Diagnosis not present

## 2018-01-22 DIAGNOSIS — E039 Hypothyroidism, unspecified: Secondary | ICD-10-CM | POA: Insufficient documentation

## 2018-01-22 DIAGNOSIS — Z79899 Other long term (current) drug therapy: Secondary | ICD-10-CM | POA: Insufficient documentation

## 2018-01-22 DIAGNOSIS — Y999 Unspecified external cause status: Secondary | ICD-10-CM | POA: Insufficient documentation

## 2018-01-22 DIAGNOSIS — W010XXA Fall on same level from slipping, tripping and stumbling without subsequent striking against object, initial encounter: Secondary | ICD-10-CM | POA: Diagnosis not present

## 2018-01-22 DIAGNOSIS — R51 Headache: Secondary | ICD-10-CM | POA: Insufficient documentation

## 2018-01-22 DIAGNOSIS — I10 Essential (primary) hypertension: Secondary | ICD-10-CM | POA: Diagnosis not present

## 2018-01-22 DIAGNOSIS — S025XXA Fracture of tooth (traumatic), initial encounter for closed fracture: Secondary | ICD-10-CM | POA: Diagnosis not present

## 2018-01-22 DIAGNOSIS — R110 Nausea: Secondary | ICD-10-CM | POA: Diagnosis not present

## 2018-01-22 MED ORDER — HYDROCODONE-ACETAMINOPHEN 5-325 MG PO TABS: 1.0000 | ORAL_TABLET | Freq: Four times a day (QID) | ORAL | 0 refills | Status: DC | PRN

## 2018-01-22 MED ORDER — HYDROCODONE-ACETAMINOPHEN 5-325 MG PO TABS
2.0000 | ORAL_TABLET | Freq: Once | ORAL | Status: AC
Start: 2018-01-22 — End: 2018-01-22
  Administered 2018-01-22: 2 via ORAL
  Filled 2018-01-22: qty 2

## 2018-01-22 MED FILL — HYDROCODON-APAP 5-325: 5-325 | 1 days supply | Qty: 6 | Fill #0

## 2018-01-22 NOTE — ED Provider Notes (Signed)
[facility] EMERGENCY DEPARTMENT Provider Note   CSN: [id number] Arrival date & time: 01/22/18  1144     History   Chief Complaint Chief Complaint  Patient presents with  . Fall    HPI Lisa Acosta is a 67 y.o. female past medical history of A. fib, diverticulitis, GERD, hyperlipidemia who presents for evaluation of a mechanical fall that occurred this morning.  Patient reports that she was walking and lost her balance, causing her to fall and land on the left side of her face and head.  Patient states she did not lose any consciousness.  Patient reports that she was able to get up and ambulate after the incident.  She reports that she also hit her left knee when she fell.  Patient reports she is currently on blood thinner and takes Xarelto daily.  Patient reports that since then, she has not had any vomiting.  On ED arrival, patient complains of a generalized headache.  Patient also ports some dental pain from the front tooth where it was chips during fall.  Patient denies any neck pain, back pain, chest pain, dizziness.   The history is provided by the patient.    Past Medical History:  Diagnosis Date  . Atrial fibrillation ([place])   . Depression   . Diverticulitis   . Diverticulosis   . Esophageal ulcer   . GERD (gastroesophageal reflux disease)   . Hyperlipidemia   . Hypertension   . Hypothyroid   . OSA (obstructive sleep apnea) 08/31/2016    Patient Active Problem List   Diagnosis Date Noted  . 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 Dyspnea 09/22/2016  . OSA (obstructive sleep apnea) 08/31/2016  . PAF (paroxysmal atrial fibrillation) ([place]) 03/16/2016  . Hypothyroid 03/16/2016  . Obesity (BMI 30-39.9) 03/16/2016  . Atrial fibrillation ([place]) 01/05/2016  . Palpitations 11/10/2015  . Essential hypertension 11/10/2015  . Hyperlipidemia 11/10/2015    Past Surgical History:  Procedure Laterality Date  . CATARACT EXTRACTION    . HAND SURGERY      . KNEE SURGERY    . ROTATOR CUFF REPAIR    . TOTAL ABDOMINAL HYSTERECTOMY       OB History   None      Home Medications    Prior to Admission medications   Medication Sig Start Date End Date Taking? Authorizing Provider  amLODipine (NORVASC) 5 MG tablet Take 1 tablet (5 mg total) by mouth daily. <PLEASE MAKE APPOINTMENT FOR REFILLS> 10/30/17   Lelon Perla, MD  atenolol (TENORMIN) 50 MG tablet Take 1 tablet (50 mg total) by mouth daily. 11/29/16   Copland, Gay Filler, MD  BIOTIN PO Take 1 tablet by mouth daily.    [provider]  Cholecalciferol (D 2000) 2000 units TABS Take 1 tablet by mouth daily.    [provider]  flecainide (TAMBOCOR) 100 MG tablet Take 1 tablet (100 mg total) by mouth 2 (two) times daily. 10/29/17 10/29/18  Copland, Gay Filler, MD  FLUoxetine (PROZAC) 40 MG capsule Take 1 capsule (40 mg total) by mouth 2 (two) times daily. 12/10/17   Copland, Gay Filler, MD  fluticasone (FLONASE) 50 MCG/ACT nasal spray Place 2 sprays into both nostrils daily. Patient not taking: Reported on 01/11/2018 12/15/16   Saguier, Percell Miller, PA-C  HYDROcodone-acetaminophen (NORCO/VICODIN) 5-325 MG tablet Take 1-2 tablets by mouth every 6 (six) hours as needed. 01/22/18   Volanda Napoleon, PA-C  Krill Oil 1000 MG CAPS Take 1 capsule by mouth  daily.    [provider]  lactobacillus acidophilus (BACID) TABS tablet Take 1 tablet by mouth daily.    [provider]  levothyroxine (SYNTHROID, LEVOTHROID) 125 MCG tablet Take 1 tablet (125 mcg total) by mouth daily before breakfast. 01/15/18   Copland, Gay Filler, MD  lovastatin (MEVACOR) 40 MG tablet Take 40 mg by mouth at bedtime.    [provider]  LUTEIN PO Take 1 capsule by mouth daily.    [provider]  magnesium 30 MG tablet Take 30 mg by mouth daily.    [provider]  omeprazole (PRILOSEC) 40 MG capsule Take 40 mg by mouth 2 (two) times daily.    [provider]   ondansetron (ZOFRAN ODT) 4 MG disintegrating tablet Take 1 tablet (4 mg total) by mouth every 8 (eight) hours as needed for nausea or vomiting. Patient not taking: Reported on 01/11/2018 08/21/17   Blanchie Dessert, MD  Potassium 75 MG TABS Take 1 tablet by mouth daily.    [provider]  traZODone (DESYREL) 50 MG tablet Take 1 tablet (50 mg total) by mouth at bedtime. 01/22/17   Copland, Gay Filler, MD  Turmeric 450 MG CAPS Take 1 capsule by mouth daily.    [provider]  XARELTO 20 MG TABS tablet TAKE 1 TABLET DAILY WITH   SUPPER 10/26/17   Copland, Gay Filler, MD    Family History Family History  Problem Relation Age of Onset  . Atrial fibrillation Mother   . Congestive Heart Failure Mother   . Heart disease Father   . Heart disease Brother   . Heart disease Brother     Social History Social History   Tobacco Use  . Smoking status: Never Smoker  . Smokeless tobacco: Never Used  Substance Use Topics  . Alcohol use: Yes    Comment: rare  . Drug use: No     Allergies   Dextran; Dextrans; Dextromethorphan hbr; Tree extract; Penicillin g; Penicillins; Sulfa antibiotics; and Sulfacetamide sodium   Review of Systems Review of Systems  Constitutional: Negative for fever.  Cardiovascular: Negative for chest pain.  Gastrointestinal: Positive for nausea. Negative for abdominal pain and vomiting.  Genitourinary: Negative for hematuria.  Neurological: Positive for headaches. Negative for dizziness.     Physical Exam Updated Vital Signs BP 138/61 (BP Location: Right Arm)   Pulse 60   Temp 98.2 F (36.8 C) (Oral)   Resp 20   Ht 5\' 1"  (1.549 m)   Wt 90.7 kg (200 lb)   SpO2 91%   BMI 37.79 kg/m   Physical Exam  Constitutional: She is oriented to person, place, and time. She appears well-developed and well-nourished.  HENT:  Head: Normocephalic and atraumatic. Head is without raccoon's eyes and without Battle's sign.    Mouth/Throat: Oropharynx is  clear and moist and mucous membranes are normal.  Tooth #9 is partially chipped.  Edition otherwise appears to be intact.  Eyes: Pupils are equal, round, and reactive to light. Conjunctivae, EOM and lids are normal.  EOMs intact without any difficulty.  No tenderness palpation into the periorbital region bilaterally.  No deformity or crepitus noted.  Neck: Full passive range of motion without pain.  Full flexion/extension and lateral movement of neck fully intact. No bony midline tenderness. No deformities or crepitus.   Cardiovascular: Normal rate, regular rhythm, normal heart sounds and normal pulses. Exam reveals no gallop and no friction rub.  No murmur heard. Pulmonary/Chest: Effort normal and breath  sounds normal.  Abdominal: Soft. Normal appearance. There is no tenderness. There is no rigidity and no guarding.  Musculoskeletal: Normal range of motion.       Thoracic back: She exhibits no tenderness.       Lumbar back: She exhibits no tenderness.  No tenderness noted to right lower extremity.  Small area of ecchymosis with centered abrasion noted to the anterior aspect of left knee.  Range of motion of left knee intact without difficulty.  Neurological: She is alert and oriented to person, place, and time.  Cranial nerves III-XII intact Follows commands, Moves all extremities  5/5 strength to BUE and BLE  Sensation intact throughout all major nerve distributions Normal finger to nose. No dysdiadochokinesia. No pronator drift. No gait abnormalities  No slurred speech. No facial droop.   Skin: Skin is warm and dry. Capillary refill takes less than 2 seconds.  Psychiatric: She has a normal mood and affect. Her speech is normal.  Nursing note and vitals reviewed.    ED Treatments / Results  Labs (all labs ordered are listed, but only abnormal results are displayed) Labs Reviewed - No data to display  EKG None  Radiology Ct Head Wo Contrast  Result Date: 01/22/2018 CLINICAL  DATA:  Recent trip and fall with headaches, initial encounter EXAM: CT HEAD WITHOUT CONTRAST TECHNIQUE: Contiguous axial images were obtained from the base of the skull through the vertex without intravenous contrast. COMPARISON:  None. FINDINGS: Brain: No evidence of acute infarction, hemorrhage, hydrocephalus, extra-axial collection or mass lesion/mass effect. Vascular: No hyperdense vessel or unexpected calcification. Skull: Normal. Negative for fracture or focal lesion. Sinuses/Orbits: No acute finding. Other: None. IMPRESSION: Normal head CT Electronically Signed   By: Inez Catalina M.D.   On: 01/22/2018 12:09    Procedures Procedures (including critical care time)  Medications Ordered in ED Medications  HYDROcodone-acetaminophen (NORCO/VICODIN) 5-325 MG per tablet 2 tablet (2 tablets Oral Given 01/22/18 1435)     Initial Impression / Assessment and Plan / ED Course  I have reviewed the triage vital signs and the nursing notes.  Pertinent labs & imaging results that were available during my care of the patient were reviewed by me and considered in my medical decision making (see chart for details).     67 year old female who presents after mechanical fall that occurred this morning.  Patient reports she tripped and fell landing on her left head.  No LOC.  Patient is on Xarelto.  On ED arrival complains of a generalized headache, dental pain and left knee pain.  Patient has not had any vomiting. Patient is afebrile, non-toxic appearing, sitting comfortably on examination table. Vital signs reviewed and stable. No neuro deficits noted on exam.  On exam, patient has an area scattered ecchymosis and abrasions noted to left side of face.  EOMs intact without difficulty.  No tenderness palpation noted to bilateral periorbital region.  Consider contusion.  CT head ordered at triage.  CT head reviewed.  Negative for any skull fracture or intracranial hemorrhage.  Discussed results with patient.  Will  provide analgesics in the department for headache.  Reevaluation after analgesics.  Patient reports headache is improved.  No vomiting here in the ED.  Patient stable for discharge at this time.  Encourage primary care follow-up in the next 2 to 4 days for further evaluation.  Final Clinical Impressions(s) / ED Diagnoses   Final diagnoses:  Contusion of scalp, initial encounter  Injury of head, initial encounter  ED Discharge Orders        Ordered    HYDROcodone-acetaminophen (NORCO/VICODIN) 5-325 MG tablet  Every 6 hours PRN     01/22/18 1550       Desma Mcgregor 01/22/18 1708    Tegeler, Gwenyth Allegra, MD 01/22/18 2039

## 2018-01-22 NOTE — Telephone Encounter (Signed)
[facility] phoned author regarding pt's fall onto concrete today, bruising cheek. Pt. on xarelto. Author recommended pt. go to ED for follow-up and [facility] nurse relayed information; pt. compliant. Dr. Lorelei Pont made aware.

## 2018-01-22 NOTE — ED Notes (Signed)
ED Provider at bedside. 

## 2018-01-22 NOTE — ED Triage Notes (Addendum)
Pt tripped in yard this am-struck left side of face and broke piece of tooth-no break in skin noted-denies LOC-states she is on blood thinners-NAD-steady gait

## 2018-01-22 NOTE — Telephone Encounter (Signed)
Pt just fell and hit her her left side of head, and broke her tooth. Pt is on Xarelto. Pt wants to know if she should be concerned. Please call back. Pt sitting in recliner, will wait for your call  Call to patient- patient fell in driveway while working in raise bed and hit her face on concrete block. Patient states she hit her L cheek bone and she does have pain at the site. Patient is on [place]. Call to office and they concur that patient should be evaluated at ED- patient directed to ED and she is going to have her husband take her now.  Reason for Disposition . Taking Coumadin (warfarin) or other strong blood thinner, or known bleeding disorder (e.g., thrombocytopenia)  Answer Assessment - Initial Assessment Questions 1. MECHANISM: "How did the injury happen?" For falls, ask: "What height did you fall from?" and "What surface did you fall against?"      Fell and hit on concrete- patient was standing- hit concrete block 2. ONSET: "When did the injury happen?" (Minutes or hours ago)      Today 10:00 3. NEUROLOGIC SYMPTOMS: "Was there any loss of consciousness?" "Are there any other neurological symptoms?"      No loss of consciousness- no dizziness- she does have HA, no blurred vision 4. MENTAL STATUS: "Does the person know who he is, who you are, and where he is?"      Patient is aware 5. LOCATION: "What part of the head was hit?"      Patient hit left side of head 6. SCALP APPEARANCE: "What does the scalp look like? Is it bleeding now?" If so, ask: "Is it difficult to stop?"      Skinned knee, cheek bone is red, scalp is clear 7. SIZE: For cuts, bruises, or swelling, ask: "How large is it?" (e.g., inches or centimeters)      Cheek bone red, chipped tooth, finger sore,skinned L knee 8. PAIN: "Is there any pain?" If so, ask: "How bad is it?"  (e.g., Scale 1-10; or mild, moderate, severe)     Pain in head- most around L side at cheek bone 9. TETANUS: For any breaks in the skin, ask:  "When was the last tetanus booster?"     no 10. OTHER SYMPTOMS: "Do you have any other symptoms?" (e.g., neck pain, vomiting)       No- nauseous- patient states she has been having that problem on going 11. PREGNANCY: "Is there any chance you are pregnant?" "When was your last menstrual period?"       n/a  Protocols used: HEAD INJURY-A-AH

## 2018-01-22 NOTE — Discharge Instructions (Signed)
You can take 1000 mg of Tylenol.  Do not exceed 4000 mg of Tylenol a day. You can take the pain medication for severe or breakthrough pain.   Apply ice to the affected area.   Follow-up with your primary care doctor in the next 2 to 4 days for further evaluation.  Return to the emergency department for any worsening pain, vision changes, vomiting, difficulty walking, numbness/weakness of your arms or legs or any other worsening or concerning symptoms.

## 2018-01-22 NOTE — ED Notes (Addendum)
Patient given water and crackers for PO challenge.

## 2018-01-24 ENCOUNTER — Other Ambulatory Visit: Payer: Self-pay | Admitting: Family

## 2018-01-24 ENCOUNTER — Encounter (HOSPITAL_BASED_OUTPATIENT_CLINIC_OR_DEPARTMENT_OTHER): Payer: Self-pay

## 2018-01-24 ENCOUNTER — Ambulatory Visit (HOSPITAL_BASED_OUTPATIENT_CLINIC_OR_DEPARTMENT_OTHER)
Admission: RE | Admit: 2018-01-24 | Discharge: 2018-01-24 | Disposition: A | Discharge status: Home / Self Care | Payer: Medicare Other | Source: Ambulatory Visit | Attending: Family | Admitting: Family

## 2018-01-24 DIAGNOSIS — M85852 Other specified disorders of bone density and structure, left thigh: Secondary | ICD-10-CM | POA: Diagnosis not present

## 2018-01-24 DIAGNOSIS — Z1231 Encounter for screening mammogram for malignant neoplasm of breast: Secondary | ICD-10-CM | POA: Insufficient documentation

## 2018-01-24 DIAGNOSIS — Z780 Asymptomatic menopausal state: Secondary | ICD-10-CM

## 2018-01-24 DIAGNOSIS — Z1239 Encounter for other screening for malignant neoplasm of breast: Secondary | ICD-10-CM

## 2018-01-25 ENCOUNTER — Encounter: Payer: Self-pay | Admitting: Family

## 2018-01-25 ENCOUNTER — Other Ambulatory Visit: Payer: Self-pay | Admitting: Family

## 2018-01-25 DIAGNOSIS — M810 Age-related osteoporosis without current pathological fracture: Secondary | ICD-10-CM | POA: Insufficient documentation

## 2018-01-25 DIAGNOSIS — M8580 Other specified disorders of bone density and structure, unspecified site: Secondary | ICD-10-CM | POA: Insufficient documentation

## 2018-01-25 MED ORDER — CALCIUM CARBONATE-VITAMIN D 600-400 MG-UNIT PO TABS: 1.0000 | ORAL_TABLET | Freq: Two times a day (BID) | ORAL | Status: DC

## 2018-01-26 NOTE — Progress Notes (Signed)
[facility] [address], [place], [place] [address] [phone]  Date:  01/28/2018   Name:  Lisa Acosta   DOB:  11/13/1950   MRN:  [id number]  PCP:  Darreld Mclean, MD    Chief Complaint: No chief complaint on file.   History of Present Illness:  Lisa Acosta is a 67 y.o. very pleasant female patient who presents with the following:  Here today for a follow-up visit- she was seen in the ER after a fall in which she hit her head on 5/14- she is on xarelto for her a fib  Negative CT scan, she also had her dexa and mammo recently  Needs pneumovax and also tetanus   Lab Results  Component Value Date   TSH 1.81 08/13/2017   Pt notes that she was outdoors working in her garden- stepped over a ledge, caught her toe and fell She hit her head/ face on the concrete driveway. Also skinned her left knee and chipped a front tooth . Was seen in the ER and released  She is feeling afraid of walking now- she notes that she tends to fall more easily than she would expect She has fallen several times in the last year which I was not aware of She is nervous about falling again and plans to get a cane    She still does have some headaches- they are gradually getting better however No vomiting She does not feel 100% herself  Ct Head Wo Contrast  Result Date: 01/22/2018 CLINICAL DATA:  Recent trip and fall with headaches, initial encounter EXAM: CT HEAD WITHOUT CONTRAST TECHNIQUE: Contiguous axial images were obtained from the base of the skull through the vertex without intravenous contrast. COMPARISON:  None. FINDINGS: Brain: No evidence of acute infarction, hemorrhage, hydrocephalus, extra-axial collection or mass lesion/mass effect. Vascular: No hyperdense vessel or unexpected calcification. Skull: Normal. Negative for fracture or focal lesion. Sinuses/Orbits: No acute finding. Other: None. IMPRESSION: Normal head CT  Electronically Signed   By: Inez Catalina M.D.   On: 01/22/2018 12:09   Dg Bone Density  Result Date: 01/24/2018 EXAM: DUAL X-RAY ABSORPTIOMETRY (DXA) FOR BONE MINERAL DENSITY IMPRESSION: Lisa Acosta Your patient Lisa Acosta completed a BMD test on 01/24/2018 using the [place] (analysis version: 16.SP2) manufactured by [employer]. The following summarizes the results of our evaluation. PATIENT: Name: Lisa Acosta Patient ID: [id number] Birth Date: [birth year] Height: 61.0 in. Gender: Female Measured: 01/24/2018 Weight: 201.2 lbs. Indications: Advanced Age, Caucasian, Family Hx of Osteoporosis, History of Fracture (Adult), Hypothyroidism, Hysterectomy, Low Calcium Intake, Oophorectomy ( Bilateral), Post Menopausal Fractures: Ankle, Wrist Treatments: HRT, Levothyroxine, Vitamin D ASSESSMENT: The BMD measured at Femur Neck Left is 0.816 g/cm2 with a T-score of -1.6. This patient is considered osteopenic according to [facility] criteria. Site Region    Measured Date Measured Age WHO            YA      BMD Classification T-score AP Spine L1-L4 01/24/2018 67.7 years Normal -0.5 1.123 g/cm2 DualFemur Neck Left 01/24/2018 67.7 years Osteopenia -1.6 0.816 g/cm2 World Health Organization [facility]) criteria for post-menopausal, Caucasian Women: Normal       T-score at or above -1 SD Osteopenia   T-score between -1 and -2.5 SD Osteoporosis T-score at or below -2.5 SD RECOMMENDATION: 1. All patients should optimize calcium and vitamin D intake. 2. Consider FDA-approved medical therapies in postmenopausal  women and men aged 58 years and older, based on the following: a. A hip or vertebral(clinical or morphometric) fracture. b. T-Score < -2.5 at the femoral neck or spine after appropriate evaluation to exclude secondary causes c. Low bone mass (T-score between -1.0 and -2.5 at the femoral neck or spine) and a 10 year probability of a hip fracture >3% or a 10 year probability of major  osteoporosis-related fracture > 20% based on the [place]-adapted WHO algorithm d. Clinical judgement and/or patient preferences may indicate treatment for people with 10-year fracture probabilities above or below these levels FOLLOW-UP: Patients with diagnosis of osteoporosis or at high risk for fracture should have regular bone mineral density tests. For patients eligible for Medicare, routine testing is allowed once every 2 years. The testing frequency can be increased to one year for patients who have rapidly progressing disease, those who are receiving or discontinuing medical therapy to restore bone mass, or have additional risk factors. I have reviewed this report and agree with the above findings. [facility] Patient: Lisa Acosta   Referring Physician: Debbrah Alar Birth Date: [birth year] [age] Patient ID: [id number] Height: 61.0 in. Weight: 201.2 lbs. Measured: 01/24/2018 8:54:21 AM (16 SP 2) Gender: Female Ethnicity: White Analyzed: 01/24/2018 9:02:48 AM (16 SP 2) FRAX* 10-year Probability of Fracture Based on femoral neck BMD: DualFemur (Left) Major Osteoporotic Fracture: 14.3% Hip Fracture:                1.8% Population:                  [place] (Caucasian) Risk Factors:                History of Fracture (Adult) *FRAX is a [place] [employer] for Metabolic Bone Disease, a [place] (WHO) [facility]. ASSESSMENT: The probability of a major osteoporotic fracture is 14.3% within the next ten years. The probability of a hip fracture is 1.8% within the next ten years. Electronically Signed   By: Lowella Grip III M.D.   On: 01/24/2018 10:15   Mm 3d Screen Breast Bilateral  Result Date: 01/24/2018 CLINICAL DATA:  Screening. EXAM: DIGITAL SCREENING BILATERAL MAMMOGRAM WITH TOMO AND CAD COMPARISON:  Previous exam(s). ACR Breast Density Category b: There are scattered areas of fibroglandular density. FINDINGS:  There are no findings suspicious for malignancy. Images were processed with CAD. IMPRESSION: No mammographic evidence of malignancy. A result letter of this screening mammogram will be mailed directly to the patient. RECOMMENDATION: Screening mammogram in one year. (Code:SM-B-01Y) BI-RADS CATEGORY  1: Negative. Electronically Signed   By: Lajean Manes M.D.   On: 01/24/2018 11:21      Patient Active Problem List   Diagnosis Date Noted  . Osteopenia 01/25/2018  . 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 Dyspnea 09/22/2016  . OSA (obstructive sleep apnea) 08/31/2016  . PAF (paroxysmal atrial fibrillation) ([place]) 03/16/2016  . Hypothyroid 03/16/2016  . Obesity (BMI 30-39.9) 03/16/2016  . Essential hypertension 11/10/2015  . Hyperlipidemia 11/10/2015    Past Medical History:  Diagnosis Date  . Atrial fibrillation ([place])   . Depression   . Diverticulitis   . Diverticulosis   . Esophageal ulcer   . GERD (gastroesophageal reflux disease)   . Hyperlipidemia   . Hypertension   . Hypothyroid   . OSA (obstructive sleep apnea) 08/31/2016  . Osteopenia     Past Surgical History:  Procedure  Laterality Date  . BREAST BIOPSY Left    needle core biopsy, benign  . CATARACT EXTRACTION    . HAND SURGERY    . KNEE SURGERY    . ROTATOR CUFF REPAIR    . TOTAL ABDOMINAL HYSTERECTOMY      Social History   Tobacco Use  . Smoking status: Never Smoker  . Smokeless tobacco: Never Used  Substance Use Topics  . Alcohol use: Yes    Comment: rare  . Drug use: No    Family History  Problem Relation Age of Onset  . Atrial fibrillation Mother   . Congestive Heart Failure Mother   . Heart disease Father   . Heart disease Brother   . Heart disease Brother     Allergies  Allergen Reactions  . Dextran Anaphylaxis  . Dextrans   . Dextromethorphan Hbr Other (See Comments)    hallucinations  . Tree Extract   . Penicillin G Rash  . Penicillins Rash  . Sulfa Antibiotics Rash  .  Sulfacetamide Sodium Rash    Medication list has been reviewed and updated.  Current Outpatient Medications on File Prior to Visit  Medication Sig Dispense Refill  . amLODipine (NORVASC) 5 MG tablet Take 1 tablet (5 mg total) by mouth daily. <PLEASE MAKE APPOINTMENT FOR REFILLS> 30 tablet 0  . atenolol (TENORMIN) 50 MG tablet Take 1 tablet (50 mg total) by mouth daily. 90 tablet 3  . BIOTIN PO Take 1 tablet by mouth daily.    . Calcium Carbonate-Vitamin D (CALTRATE 600+D) 600-400 MG-UNIT tablet Take 1 tablet by mouth 2 (two) times daily.    . Cholecalciferol (D 2000) 2000 units TABS Take 1 tablet by mouth daily.    . flecainide (TAMBOCOR) 100 MG tablet Take 1 tablet (100 mg total) by mouth 2 (two) times daily. 180 tablet 3  . FLUoxetine (PROZAC) 40 MG capsule Take 1 capsule (40 mg total) by mouth 2 (two) times daily. 180 capsule 3  . fluticasone (FLONASE) 50 MCG/ACT nasal spray Place 2 sprays into both nostrils daily. 16 g 1  . HYDROcodone-acetaminophen (NORCO/VICODIN) 5-325 MG tablet Take 1-2 tablets by mouth every 6 (six) hours as needed. 6 tablet 0  . Krill Oil 1000 MG CAPS Take 1 capsule by mouth daily.    [name] lactobacillus acidophilus (BACID) TABS tablet Take 1 tablet by mouth daily.    [name] levothyroxine (SYNTHROID, LEVOTHROID) 125 MCG tablet Take 1 tablet (125 mcg total) by mouth daily before breakfast. 90 tablet 1  . lovastatin (MEVACOR) 40 MG tablet Take 40 mg by mouth at bedtime.    . LUTEIN PO Take 1 capsule by mouth daily.    . magnesium 30 MG tablet Take 30 mg by mouth daily.    [name] omeprazole (PRILOSEC) 40 MG capsule Take 40 mg by mouth 2 (two) times daily.    . ondansetron (ZOFRAN ODT) 4 MG disintegrating tablet Take 1 tablet (4 mg total) by mouth every 8 (eight) hours as needed for nausea or vomiting. 20 tablet 0  . Potassium 75 MG TABS Take 1 tablet by mouth daily.    . traZODone (DESYREL) 50 MG tablet Take 1 tablet (50 mg total) by mouth at bedtime. 90 tablet 3  . Turmeric 450 MG  CAPS Take 1 capsule by mouth daily.    [name] 20 MG TABS tablet TAKE 1 TABLET DAILY WITH   SUPPER 90 tablet 3   No current facility-administered medications on file prior to visit.  Review of Systems:  As per HPI- otherwise negative. Pulse Readings from Last 3 Encounters:  01/28/18 (!) 54  01/22/18 60  01/11/18 (!) 52      Physical Examination: Vitals:   01/28/18 1222  BP: 122/68  Pulse: (!) 54  Resp: 16  Temp: 99.3 F (37.4 C)  SpO2: 95%   Vitals:   01/28/18 1222  Weight: 198 lb 12.8 oz (90.2 kg)  Height: 5' 1.5" (1.562 m)   Body mass index is 36.95 kg/m. Ideal Body Weight: Weight in (lb) to have BMI = 25: 134.2  GEN: WDWN, NAD, Non-toxic, A & O x 3, obese, looks well Bruising on left cheek HEENT: Atraumatic, Normocephalic. Neck supple. No masses, No LAD.  Bilateral TM wnl, oropharynx normal.  PEERL,EOMI.   Bruise of left anterior cheek.  Small chip of tooth Ears and Nose: No external deformity. CV: RRR, No M/G/R. No JVD. No thrill. No extra heart sounds. PULM: CTA B, no wheezes, crackles, rhonchi. No retractions. No resp. distress. No accessory muscle use. ABD: S, NT, ND, +BS. No rebound. No HSM. EXTR: No c/c/e NEURO Normal gait.  PSYCH: Normally interactive. Conversant. Not depressed or anxious appearing.  Calm demeanor.  Abrasion left knee    Assessment and Plan: Fall at home, sequela - Plan: Ambulatory referral to Physical Therapy  Immunization due - Plan: Pneumococcal polysaccharide vaccine 23-valent greater than or equal to 2yo subcutaneous/IM  Unspecified open wound, left knee, initial encounter - Plan: Tdap vaccine greater than or equal to 7yo IM  Contusion of face, subsequent encounter  Frequent falls tdap and pneumovax today She is becoming nervous about falls- referral to PT to work on her balance and strength Discussed possilbe concussion and importance of rest  She will seek emergency care if not ok See patient instructions for more  details.   Noted bradycardia- will reach out to her cardiologist about possibly scaling back on her BB   BP Readings from Last 3 Encounters:  01/28/18 122/68  01/22/18 138/61  01/11/18 119/83   Pulse Readings from Last 3 Encounters:  01/28/18 (!) 54  01/22/18 60  01/11/18 (!) 52      Signed Lamar Blinks, MD

## 2018-01-28 ENCOUNTER — Ambulatory Visit (INDEPENDENT_AMBULATORY_CARE_PROVIDER_SITE_OTHER): Payer: Medicare Other | Admitting: Family Medicine

## 2018-01-28 ENCOUNTER — Encounter: Payer: Self-pay | Admitting: Family Medicine

## 2018-01-28 VITALS — BP 122/68 | HR 54 | Temp 99.3°F | Resp 16 | Ht 61.5 in | Wt 198.8 lb

## 2018-01-28 DIAGNOSIS — Z23 Encounter for immunization: Secondary | ICD-10-CM | POA: Diagnosis not present

## 2018-01-28 DIAGNOSIS — S81002A Unspecified open wound, left knee, initial encounter: Secondary | ICD-10-CM

## 2018-01-28 DIAGNOSIS — W19XXXS Unspecified fall, sequela: Secondary | ICD-10-CM

## 2018-01-28 DIAGNOSIS — S0083XD Contusion of other part of head, subsequent encounter: Secondary | ICD-10-CM | POA: Diagnosis not present

## 2018-01-28 DIAGNOSIS — Y92009 Unspecified place in unspecified non-institutional (private) residence as the place of occurrence of the external cause: Secondary | ICD-10-CM | POA: Diagnosis not present

## 2018-01-28 DIAGNOSIS — R001 Bradycardia, unspecified: Secondary | ICD-10-CM

## 2018-01-28 NOTE — Patient Instructions (Addendum)
It was very nice to see you today- I am sorry about your fall!  We will set you up with PT to work on your balance, strength and confidence while walking  Please seek care right away if you have escalating headache or vomiting, or if you are just not feeling ok You got your 2nd pneumonia vaccine and your tetanus booster today  Please see me in 4-6 months for a recheck visit

## 2018-01-29 DIAGNOSIS — M1711 Unilateral primary osteoarthritis, right knee: Secondary | ICD-10-CM | POA: Diagnosis not present

## 2018-01-30 ENCOUNTER — Encounter: Payer: Self-pay | Admitting: Physical Therapy

## 2018-01-30 ENCOUNTER — Encounter: Payer: Self-pay | Admitting: Family Medicine

## 2018-01-30 ENCOUNTER — Ambulatory Visit: Payer: Medicare Other | Attending: Family Medicine | Admitting: Physical Therapy

## 2018-01-30 ENCOUNTER — Other Ambulatory Visit: Payer: Self-pay

## 2018-01-30 DIAGNOSIS — R2681 Unsteadiness on feet: Secondary | ICD-10-CM | POA: Insufficient documentation

## 2018-01-30 DIAGNOSIS — R262 Difficulty in walking, not elsewhere classified: Secondary | ICD-10-CM | POA: Diagnosis not present

## 2018-01-30 DIAGNOSIS — M6281 Muscle weakness (generalized): Secondary | ICD-10-CM | POA: Diagnosis not present

## 2018-01-30 NOTE — Therapy (Signed)
[facility] [address]  [place], [place], [address] Phone: [phone]   Fax:  [phone]  Physical Therapy Evaluation  Patient Details  Name: Lisa Acosta MRN: [id number] Date of Birth: 1951/04/11 Referring Provider: Lamar Blinks, MD   Encounter Date: 01/30/2018  PT End of Session - 01/30/18 1130    Visit Number  1    Number of Visits  17    Date for PT Re-Evaluation  03/27/18    Authorization Type  Medicare & M of O    PT Start Time  0930    PT Stop Time  1015    PT Time Calculation (min)  45 min    Activity Tolerance  Patient tolerated treatment well    Behavior During Therapy  [facility] for tasks assessed/performed       Past Medical History:  Diagnosis Date  . Atrial fibrillation ([place])   . Depression   . Diverticulitis   . Diverticulosis   . Esophageal ulcer   . GERD (gastroesophageal reflux disease)   . Hyperlipidemia   . Hypertension   . Hypothyroid   . OSA (obstructive sleep apnea) 08/31/2016  . Osteopenia     Past Surgical History:  Procedure Laterality Date  . BREAST BIOPSY Left    needle core biopsy, benign  . CATARACT EXTRACTION    . HAND SURGERY    . KNEE SURGERY    . ROTATOR CUFF REPAIR    . TOTAL ABDOMINAL HYSTERECTOMY      There were no vitals filed for this visit.   Subjective Assessment - 01/30/18 0934    Subjective  Patient reports she falls too often. Last fall was 01/22/18; patient fell and hit L side of head and chipped a tooth after stepping over a ledge and catching her toe. Does not know the cause of these falls; denies dizziness but reports intermittent mind fog. Reports she feels unstable when bending forward, stepping over obstacles, and when on unstable surfaces or crowded places. Also reports her laundry room is on the top floor and believes she has trouble with stairs. Is thinking about getting a SPC. Reports HAs since her fall, believes she has a concussion.  Reports horrible headaches, slight difficulty reading after the fall.  Pain at best: 2/10, at its worst: 5-6/10.    Limitations  Reading;Walking;House hold activities    Diagnostic tests  01/22/18 normal head CT    Patient Stated Goals  walking without fear, be more active    Currently in Pain?  Yes    Pain Score  3     Pain Location  Head    Pain Orientation  Right;Left;Posterior;Anterior    Aggravating Factors   constant; no triggers    Pain Relieving Factors  hydrocone for first 2 days, tylenol for HAs         [facility] PT Assessment - 01/30/18 0001      Assessment   Medical Diagnosis  Fall at home, sequela    Referring Provider  Lamar Blinks, MD    Onset Date/Surgical Date  01/22/18    Next MD Visit  In august    Prior Therapy  No      Precautions   Precautions  None      Restrictions   Weight Bearing Restrictions  No      Balance Screen   Has the patient fallen in the past 6 months  Yes    How many times?  3  Has the patient had a decrease in activity level because of a fear of falling?   Yes    Is the patient reluctant to leave their home because of a fear of falling?   No "i'm on the edge"      [place] residence    Living Arrangements  Other relatives    Available Help at Discharge  Family    Type of [place] Access  Level entry    [place]  Two level laundry room upstairs    Alternate Level Stairs-Number of Steps  13    Alternate Level Stairs-Rails  Can reach both    [place]  None intends to get a cane      Prior Function   Level of Independence  Independent    Vocation  Retired    Leisure  Copy   Overall Cognitive Status  Within [place] for tasks assessed      Sensation   Light Touch  Appears Intact      Coordination   Gross Motor Movements are Fluid and Coordinated  Yes      Posture/Postural Control   Posture/Postural Control  Postural limitations     Postural Limitations  Rounded Shoulders;Forward head      ROM / Strength   AROM / PROM / Strength  Strength;PROM;AROM      AROM   AROM Assessment Site  Ankle    Right/Left Ankle  Right;Left    Right Ankle Dorsiflexion  5    Left Ankle Dorsiflexion  3      Strength   Strength Assessment Site  Hip;Knee;Ankle    Right/Left Hip  Right;Left    Right Hip Flexion  4-/5    Right Hip ABduction  4/5    Right Hip ADduction  4-/5    Left Hip Flexion  4/5    Left Hip ABduction  4/5    Left Hip ADduction  4-/5    Right/Left Knee  Right;Left    Right Knee Flexion  3+/5    Right Knee Extension  4-/5    Left Knee Flexion  4/5    Left Knee Extension  4-/5    Right/Left Ankle  Right;Left    Right Ankle Dorsiflexion  4/5    Right Ankle Plantar Flexion  4-/5    Left Ankle Dorsiflexion  4/5    Left Ankle Plantar Flexion  4/5      Ambulation/Gait   Gait Pattern  Step-through pattern slightly slow gait velocity, fairly stable      Standardized Balance Assessment   Standardized Balance Assessment  Berg Balance Test      Berg Balance Test   Sit to Stand  Able to stand without using hands and stabilize independently    Standing Unsupported  Able to stand safely 2 minutes    Sitting with Back Unsupported but Feet Supported on Floor or Stool  Able to sit safely and securely 2 minutes    Stand to Sit  Sits safely with minimal use of hands    Transfers  Able to transfer safely, minor use of hands    Standing Unsupported with Eyes Closed  Able to stand 10 seconds safely    Standing Ubsupported with Feet Together  Able to place feet together independently and stand 1 minute safely    From Standing, Reach Forward with Outstretched Arm  Can reach  forward >12 cm safely (5")    From Standing Position, Pick up Object from [place] to pick up shoe safely and easily    From Standing Position, Turn to Look Behind Over each Shoulder  Looks behind from both sides and weight shifts well    Turn 360 Degrees   Able to turn 360 degrees safely in 4 seconds or less    Standing Unsupported, Alternately Place Feet on Step/Stool  Able to stand independently and complete 8 steps >20 seconds    Standing Unsupported, One Foot in Many to take small step independently and hold 30 seconds    Standing on One Leg  Tries to lift leg/unable to hold 3 seconds but remains standing independently    Total Score  49                Objective measurements completed on examination: See above findings.              PT Education - 01/30/18 1130    Education provided  Yes    Education Details  prognosis, POC, HEP    Person(s) Educated  Patient    Methods  Explanation;Demonstration;Tactile cues;Verbal cues;Handout    Comprehension  Verbalized understanding       PT Short Term Goals - 01/30/18 1140      PT SHORT TERM GOAL #1   Title  Patient to be independent with initial HEP.    Time  4    Period  Weeks    Status  New    Target Date  02/27/18        PT Long Term Goals - 01/30/18 1140      PT LONG TERM GOAL #1   Title  Patient to be independent with advanced HEP.    Time  8    Period  Weeks    Status  New    Target Date  03/27/18      PT LONG TERM GOAL #2   Title  Patient to demonstrate >/4+/5 strength in B LEs in order to improve stair climbing at home.    Time  8    Period  Weeks    Status  New    Target Date  03/27/18      PT LONG TERM GOAL #3   Title  Patient to score 52/56 on Berg in order to demonstrate low risk of falls.    Time  8    Period  Weeks    Status  New    Target Date  03/27/18      PT LONG TERM GOAL #4   Title  Patient to demonstrate anterior and lateral stepping over 10 in obstacle in order to clear ledges in her garden safely.    Time  8    Period  Weeks    Status  New    Target Date  03/27/18             Plan - 01/30/18 1131    Clinical Impression Statement  Patient is a 67y/o F presenting to OPPT with report of falls and imbalance.  Last fall was 01/22/18; patient fell and hit L side of head and chipped a tooth after stepping over a ledge and catching her toe. Does not know the cause of these falls; denies dizziness but reports intermittent mind fog. Reports she feels unstable when bending forward, stepping over obstacles, when on unstable surfaces, crowded places, and climbing stairs. Presents with the  following impairments: decreased strength, decreased flexibility, impaired balance, decreased mobility. Patient scored 49/56 on Berg, indicating a moderate risk of falls. Would benefit from skilled PT services 2x/week for 8 weeks to address aforementioned impairments. Patient educated on simple balance exercises with counter top for support as needed; received HEP handout and reported understanding. Encourage pt to get a SPC to increase confidence in her balance abilities on uneven surfaces and crowded places; will wean off AD as pt improves balance abilities.     History and Personal Factors relevant to plan of care:  osteopenia, OA, dyspnea, paroxysmal a-fib, RTC repair, hand and knee surgery    Clinical Presentation  Stable    Clinical Decision Making  Low    Rehab Potential  Good    PT Frequency  2x / week    PT Duration  8 weeks    PT Treatment/Interventions  ADLs/Self Care Home Management;Cryotherapy;Electrical Stimulation;Iontophoresis 4mg /ml Dexamethasone;Moist Heat;Ultrasound;Gait training;Stair training;Functional mobility training;Therapeutic activities;Therapeutic exercise;Manual techniques;Patient/family education;Neuromuscular re-education;Balance training;Passive range of motion;Dry needling;Energy conservation;Taping;Splinting;Vasopneumatic Device;Vestibular    PT Next Visit Plan  Reassess HEP; work on limits of stability in all directions, SLS, step ups    Consulted and Agree with Plan of Care  Patient       Patient will benefit from skilled therapeutic intervention in order to improve the following deficits and  impairments:  Decreased activity tolerance, Decreased strength, Pain, Decreased balance, Decreased mobility, Difficulty walking, Decreased range of motion, Postural dysfunction  Visit Diagnosis: Unsteadiness on feet  Muscle weakness (generalized)  Difficulty in walking, not elsewhere classified     Problem List Patient Active Problem List   Diagnosis Date Noted  . Osteopenia 01/25/2018  . 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 Dyspnea 09/22/2016  . OSA (obstructive sleep apnea) 08/31/2016  . PAF (paroxysmal atrial fibrillation) ([place]) 03/16/2016  . Hypothyroid 03/16/2016  . Obesity (BMI 30-39.9) 03/16/2016  . Essential hypertension 11/10/2015  . Hyperlipidemia 11/10/2015    Janene Harvey, PT, DPT 01/30/18 11:48 AM   [facility] [address]  [place], [place], [address] Phone: [phone]   Fax:  [phone]  Name: Lisa Acosta MRN: [id number] Date of Birth: 11-Apr-1951

## 2018-01-30 NOTE — Patient Instructions (Signed)
Access Code: [email or web address]  URL: [email or web address]  Date: 01/30/2018  Prepared by: Grayling Congress   Exercises  Standing Single Leg Stance with Counter Support - 10 reps - 2 sets - 2x daily - 7x weekly  Standing Tandem Balance with Counter Support - 10 reps - 2 sets - 2x daily - 7x weekly  Standing Gastroc Stretch at Counter - 10 reps - 2 sets - 20 hold - 2x daily - 7x weekly  Standing Heel Raise with Support - 10 reps - 2 sets - 2x daily - 7x weekly  Standing Hamstring Curl with Resistance - 10 reps - 2 sets - 2x daily - 7x weekly

## 2018-02-05 DIAGNOSIS — M1711 Unilateral primary osteoarthritis, right knee: Secondary | ICD-10-CM | POA: Diagnosis not present

## 2018-02-06 ENCOUNTER — Ambulatory Visit: Payer: Medicare Other | Admitting: Physical Therapy

## 2018-02-06 ENCOUNTER — Encounter: Payer: Self-pay | Admitting: Physical Therapy

## 2018-02-06 DIAGNOSIS — M6281 Muscle weakness (generalized): Secondary | ICD-10-CM

## 2018-02-06 DIAGNOSIS — R2681 Unsteadiness on feet: Secondary | ICD-10-CM

## 2018-02-06 DIAGNOSIS — R262 Difficulty in walking, not elsewhere classified: Secondary | ICD-10-CM | POA: Diagnosis not present

## 2018-02-06 NOTE — Therapy (Signed)
[facility] [address]  [place], [place], [address] Phone: [phone]   Fax:  [phone]  Physical Therapy Treatment  Patient Details  Name: Lisa Acosta MRN: [id number] Date of Birth: [birth year] Referring Provider: Lamar Blinks, MD   Encounter Date: 02/06/2018  PT End of Session - 02/06/18 1155    Visit Number  2    Number of Visits  17    Date for PT Re-Evaluation  03/27/18    Authorization Type  Medicare & M of O    PT Start Time  1102    PT Stop Time  1150    PT Time Calculation (min)  48 min    Equipment Utilized During Treatment  Gait belt    Activity Tolerance  Patient tolerated treatment well    Behavior During Therapy  [facility] for tasks assessed/performed       Past Medical History:  Diagnosis Date  . Atrial fibrillation ([place])   . Depression   . Diverticulitis   . Diverticulosis   . Esophageal ulcer   . GERD (gastroesophageal reflux disease)   . Hyperlipidemia   . Hypertension   . Hypothyroid   . OSA (obstructive sleep apnea) 08/31/2016  . Osteopenia     Past Surgical History:  Procedure Laterality Date  . BREAST BIOPSY Left    needle core biopsy, benign  . CATARACT EXTRACTION    . HAND SURGERY    . KNEE SURGERY    . ROTATOR CUFF REPAIR    . TOTAL ABDOMINAL HYSTERECTOMY      There were no vitals filed for this visit.  Subjective Assessment - 02/06/18 1105    Subjective  Patient reports she has not been able to perform HEP since last treatment. Got a cane on Friday, but fell on Wednesday when holding a plant and walking on the side walk. "The next thing I knew I was face first on the side walk" Was not using cane; reports she has some floppy shoes on. Did not get checked out because she did not think she needed it but suffered face laceration above top lip. Reports she still has HAs since her last fall, but they are no worse since this most recent one. Denies any new symptoms.    Diagnostic tests  01/22/18 normal head CT    Patient Stated Goals  walking without fear, be more active    Currently in Pain?  Yes    Pain Score  4     Pain Location  Hand    Pain Orientation  Right;Left    Pain Descriptors / Indicators  Aching                       [facility] Adult PT Treatment/Exercise - 02/06/18 0001      Exercises   Exercises  Knee/Hip;Neck      Neck Exercises: Seated   Neck Retraction  10 reps;3 secs patient OP; TCs to bring head back      Knee/Hip Exercises: Stretches   Gastroc Stretch  Both;1 rep;20 seconds prostretch at counter top      Knee/Hip Exercises: Aerobic   Nustep  L3x6 min      Knee/Hip Exercises: Standing   Heel Raises  1 set;10 reps;Both UEs at counter top    Knee Flexion  Strengthening;Both;1 set;10 reps UEs at counter top; 3# ankle weights    Hip Flexion  Stengthening;Both;1 set;20 reps;Knee bent UEs at counter  with 3# ankle weights    Forward Step Up  Hand Hold: 1;Step Height: 4";2 sets;Both;10 reps Added cognitive task- alphabet backwards    Forward Step Up Limitations  Step forward to squishy disc- 10x each LE with hand hold    SLS  Each LE ~30 secx 2 with UE at counter top gait belt     Other Standing Knee Exercises  tandem each LE 2x30 sec with UEs at counter top; EC and EO    Other Standing Knee Exercises  weight shifts on airex; EO/EC with CGA/min A; 3 min      Manual Therapy   Manual Therapy  Soft tissue mobilization    Manual therapy comments  B suboccipitals and UT; restriction on L             PT Education - 02/06/18 1155    Education provided  Yes    Education Details  addition to [employer]) Educated  Patient    Methods  Explanation;Demonstration;Tactile cues;Verbal cues;Handout    Comprehension  Verbalized understanding;Returned demonstration       PT Short Term Goals - 01/30/18 1140      PT SHORT TERM GOAL #1   Title  Patient to be independent with initial HEP.    Time  4    Period  Weeks     Status  New    Target Date  02/27/18        PT Long Term Goals - 01/30/18 1140      PT LONG TERM GOAL #1   Title  Patient to be independent with advanced HEP.    Time  8    Period  Weeks    Status  New    Target Date  03/27/18      PT LONG TERM GOAL #2   Title  Patient to demonstrate >/4+/5 strength in B LEs in order to improve stair climbing at home.    Time  8    Period  Weeks    Status  New    Target Date  03/27/18      PT LONG TERM GOAL #3   Title  Patient to score 52/56 on Berg in order to demonstrate low risk of falls.    Time  8    Period  Weeks    Status  New    Target Date  03/27/18      PT LONG TERM GOAL #4   Title  Patient to demonstrate anterior and lateral stepping over 10 in obstacle in order to clear ledges in her garden safely.    Time  8    Period  Weeks    Status  New    Target Date  03/27/18            Plan - 02/06/18 1156    Clinical Impression Statement  Patient arrived with report that she got new cane on Friday and had been trying to use it, but on Wednesday had a fall when holding a plant and walking on the side walk without her cane. Suffered laceration above top lip, otherwise denies new symptoms. Adjusted patient's SPC this date to be more appropriate for her height and encouraged ambulating around clinic with it to reinforce its use. Reassessed HEP at counter top this date; VC/TCs required to correct form. Attempted these balance exercises with EC- patient with marked difficulty without visual stabilization to compensate. Plan to continue balance with EC and on foam surfaces as  well as dual task activities. Added cervical retractions to HEP this date to correct forward head posture and ease HAs.     PT Treatment/Interventions  ADLs/Self Care Home Management;Cryotherapy;Electrical Stimulation;Iontophoresis 4mg /ml Dexamethasone;Moist Heat;Ultrasound;Gait training;Stair training;Functional mobility training;Therapeutic activities;Therapeutic  exercise;Manual techniques;Patient/family education;Neuromuscular re-education;Balance training;Passive range of motion;Dry needling;Energy conservation;Taping;Splinting;Vasopneumatic Device;Vestibular    PT Next Visit Plan  Plan to continue balance with EC and on foam surfaces as well as dual task activities.    Consulted and Agree with Plan of Care  Patient       Patient will benefit from skilled therapeutic intervention in order to improve the following deficits and impairments:  Decreased activity tolerance, Decreased strength, Pain, Decreased balance, Decreased mobility, Difficulty walking, Decreased range of motion, Postural dysfunction  Visit Diagnosis: Unsteadiness on feet  Muscle weakness (generalized)  Difficulty in walking, not elsewhere classified     Problem List Patient Active Problem List   Diagnosis Date Noted  . Osteopenia 01/25/2018  . 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 Dyspnea 09/22/2016  . OSA (obstructive sleep apnea) 08/31/2016  . PAF (paroxysmal atrial fibrillation) ([place]) 03/16/2016  . Hypothyroid 03/16/2016  . Obesity (BMI 30-39.9) 03/16/2016  . Essential hypertension 11/10/2015  . Hyperlipidemia 11/10/2015    Janene Harvey, PT, DPT 02/06/18 12:06 PM   [facility] [address]  [place], [place], [address] Phone: [phone]   Fax:  [phone]  Name: Lisa Acosta MRN: [id number] Date of Birth: 30-Jun-1951

## 2018-02-08 ENCOUNTER — Encounter: Payer: Medicare Other | Admitting: Physical Therapy

## 2018-02-11 ENCOUNTER — Encounter: Payer: Self-pay | Admitting: Physical Therapy

## 2018-02-11 ENCOUNTER — Ambulatory Visit: Payer: Medicare Other | Attending: Family Medicine | Admitting: Physical Therapy

## 2018-02-11 VITALS — BP 128/72

## 2018-02-11 DIAGNOSIS — M6281 Muscle weakness (generalized): Secondary | ICD-10-CM | POA: Diagnosis not present

## 2018-02-11 DIAGNOSIS — R262 Difficulty in walking, not elsewhere classified: Secondary | ICD-10-CM

## 2018-02-11 DIAGNOSIS — R2681 Unsteadiness on feet: Secondary | ICD-10-CM | POA: Diagnosis not present

## 2018-02-11 NOTE — Therapy (Signed)
[facility] [address]  [place], [place], [address] Phone: [phone]   Fax:  [phone]  Physical Therapy Treatment  Patient Details  Name: Lisa Acosta MRN: [id number] Date of Birth: Aug 28, 1951 Referring Provider: Lamar Blinks, MD   Encounter Date: 02/11/2018  PT End of Session - 02/11/18 1156    Visit Number  3    Number of Visits  17    Date for PT Re-Evaluation  03/27/18    Authorization Type  Medicare & M of O    PT Start Time  1104    PT Stop Time  1148    PT Time Calculation (min)  44 min    Equipment Utilized During Treatment  Gait belt    Activity Tolerance  Patient tolerated treatment well    Behavior During Therapy  WFL for tasks assessed/performed       Past Medical History:  Diagnosis Date  . Atrial fibrillation ([place])   . Depression   . Diverticulitis   . Diverticulosis   . Esophageal ulcer   . GERD (gastroesophageal reflux disease)   . Hyperlipidemia   . Hypertension   . Hypothyroid   . OSA (obstructive sleep apnea) 08/31/2016  . Osteopenia     Past Surgical History:  Procedure Laterality Date  . BREAST BIOPSY Left    needle core biopsy, benign  . CATARACT EXTRACTION    . HAND SURGERY    . KNEE SURGERY    . ROTATOR CUFF REPAIR    . TOTAL ABDOMINAL HYSTERECTOMY      Vitals:   02/11/18 1107  BP: 128/72    Subjective Assessment - 02/11/18 1107    Subjective  Patient reports no new falls since last session. Reports she almost had a fall this morning but caught herself. Was watering her plants but unable to recall what exactly. Has been doing HEP but not consistently. Has been using SPC when out of the house.    Limitations  Reading;Walking;House hold activities    Diagnostic tests  01/22/18 normal head CT    Patient Stated Goals  walking without fear, be more active    Currently in Pain?  Yes    Pain Score  3     Pain Location  Head    Pain Orientation  Right;Left    Pain Descriptors / Indicators  Aching                       [facility] Adult PT Treatment/Exercise - 02/11/18 0001      Ambulation/Gait   Ambulation Distance (Feet)  80 Feet    Assistive device  Straight cane    Gait Pattern  Step-through pattern increased weight shift R      Neck Exercises: Seated   Neck Retraction  10 reps pt OP      Knee/Hip Exercises: Aerobic   Nustep  L3x6 min      Knee/Hip Exercises: Standing   Forward Step Up  Step Height: 6";Hand Hold: 0;5 sets;Both VCs to control eccentric lower; R knee pain    Forward Step Up Limitations  5x each LE on step, 5x on airex    SLS with Vectors  Each LE to multidirectional cones + calling out colors of cones; 5 min CGA    Other Standing Knee Exercises  lateral stepping over cone; 10x each LE      Vestibular Treatment/Exercise - 02/11/18 0001      Vestibular  Treatment/Exercise   Gaze Exercises  X1 Viewing Horizontal;X1 Viewing Vertical      X1 Viewing Horizontal   Foot Position  seated, feet together    Reps  10    Comments  10x each      X1 Viewing Vertical   Foot Position  Seated, feet together    Reps  10    Comments  10x each            PT Education - 02/11/18 1155    Education provided  Yes    Education Details  SPC use on L side during ambulation    Person(s) Educated  Patient    Methods  Explanation;Demonstration    Comprehension  Verbalized understanding;Returned demonstration       PT Short Term Goals - 01/30/18 1140      PT SHORT TERM GOAL #1   Title  Patient to be independent with initial HEP.    Time  4    Period  Weeks    Status  New    Target Date  02/27/18        PT Long Term Goals - 01/30/18 1140      PT LONG TERM GOAL #1   Title  Patient to be independent with advanced HEP.    Time  8    Period  Weeks    Status  New    Target Date  03/27/18      PT LONG TERM GOAL #2   Title  Patient to demonstrate >/4+/5 strength in B LEs in order to improve stair climbing at  home.    Time  8    Period  Weeks    Status  New    Target Date  03/27/18      PT LONG TERM GOAL #3   Title  Patient to score 52/56 on Berg in order to demonstrate low risk of falls.    Time  8    Period  Weeks    Status  New    Target Date  03/27/18      PT LONG TERM GOAL #4   Title  Patient to demonstrate anterior and lateral stepping over 10 in obstacle in order to clear ledges in her garden safely.    Time  8    Period  Weeks    Status  New    Target Date  03/27/18            Plan - 02/11/18 1156    Clinical Impression Statement  Patient arrived to session with no new complaints; denies falls since last tx but reports she almost had one this AM but caught herself. Reporting intermittent compliance with HEP; educated patient on importance of keeping HEP compliance to progress balance abilities. Took orthostatics this vist- 128/37mmHg in sitting, 120/19mmHg standing. Educated patient on performing ankle pumps/marching before standing up to decrease risk of dizziness. Patient performed sitting/standing VOR training without report of dizziness. Difficulty with anterior step ups noted- patient with decreased control on eccentric lower and R knee pain. Also demonstrating unsteadiness on step ups on foam. Patient with report of fear of falling and anxiety with lateral step-overs over cone; VCs required to perform hip and knee flexion and ankle DF to full range to ensure clearance of threshold with good carryover. Educated patient on compliance of SPC use during daily activities; switched cane to L side to help ease R knee pain. Patient reported understanding of all edu this date.  PT Treatment/Interventions  ADLs/Self Care Home Management;Cryotherapy;Electrical Stimulation;Iontophoresis 4mg /ml Dexamethasone;Moist Heat;Ultrasound;Gait training;Stair training;Functional mobility training;Therapeutic activities;Therapeutic exercise;Manual techniques;Patient/family education;Neuromuscular  re-education;Balance training;Passive range of motion;Dry needling;Energy conservation;Taping;Splinting;Vasopneumatic Device;Vestibular    PT Next Visit Plan  continue balance on foam activities, lateral step-overs over cone, gait training on uneven terrain     Consulted and Agree with Plan of Care  Patient       Patient will benefit from skilled therapeutic intervention in order to improve the following deficits and impairments:  Decreased activity tolerance, Decreased strength, Pain, Decreased balance, Decreased mobility, Difficulty walking, Decreased range of motion, Postural dysfunction  Visit Diagnosis: Unsteadiness on feet  Muscle weakness (generalized)  Difficulty in walking, not elsewhere classified     Problem List Patient Active Problem List   Diagnosis Date Noted  . Osteopenia 01/25/2018  . 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 Dyspnea 09/22/2016  . OSA (obstructive sleep apnea) 08/31/2016  . PAF (paroxysmal atrial fibrillation) ([place]) 03/16/2016  . Hypothyroid 03/16/2016  . Obesity (BMI 30-39.9) 03/16/2016  . Essential hypertension 11/10/2015  . Hyperlipidemia 11/10/2015    Janene Harvey, PT, DPT 02/11/18 12:07 PM   [facility] [address]  [place], [place], [address] Phone: [phone]   Fax:  [phone]  Name: Lisa Acosta MRN: [id number] Date of Birth: [birth year]

## 2018-02-13 ENCOUNTER — Other Ambulatory Visit: Payer: Self-pay | Admitting: Family Medicine

## 2018-02-13 DIAGNOSIS — M1711 Unilateral primary osteoarthritis, right knee: Secondary | ICD-10-CM | POA: Diagnosis not present

## 2018-02-13 DIAGNOSIS — R002 Palpitations: Secondary | ICD-10-CM

## 2018-02-14 ENCOUNTER — Ambulatory Visit: Payer: Medicare Other

## 2018-02-14 DIAGNOSIS — M6281 Muscle weakness (generalized): Secondary | ICD-10-CM

## 2018-02-14 DIAGNOSIS — R2681 Unsteadiness on feet: Secondary | ICD-10-CM | POA: Diagnosis not present

## 2018-02-14 DIAGNOSIS — R262 Difficulty in walking, not elsewhere classified: Secondary | ICD-10-CM

## 2018-02-14 NOTE — Therapy (Signed)
[facility] [address]  [place], [place], [address] Phone: [phone]   Fax:  [phone]  Physical Therapy Treatment  Patient Details  Name: Lisa Acosta MRN: [id number] Date of Birth: [birth year] Referring Provider: Lamar Blinks, MD   Encounter Date: 02/14/2018  PT End of Session - 02/14/18 1108    Visit Number  4    Number of Visits  17    Date for PT Re-Evaluation  03/27/18    Authorization Type  Medicare & M of O    PT Start Time  1102    PT Stop Time  1143    PT Time Calculation (min)  41 min    Equipment Utilized During Treatment  Gait belt    Activity Tolerance  Patient tolerated treatment well    Behavior During Therapy  [facility] for tasks assessed/performed       Past Medical History:  Diagnosis Date  . Atrial fibrillation ([place])   . Depression   . Diverticulitis   . Diverticulosis   . Esophageal ulcer   . GERD (gastroesophageal reflux disease)   . Hyperlipidemia   . Hypertension   . Hypothyroid   . OSA (obstructive sleep apnea) 08/31/2016  . Osteopenia     Past Surgical History:  Procedure Laterality Date  . BREAST BIOPSY Left    needle core biopsy, benign  . CATARACT EXTRACTION    . HAND SURGERY    . KNEE SURGERY    . ROTATOR CUFF REPAIR    . TOTAL ABDOMINAL HYSTERECTOMY      There were no vitals filed for this visit.  Subjective Assessment - 02/14/18 1104    Subjective  Pt. noting she has been performing HEP.      Diagnostic tests  01/22/18 normal head CT    Patient Stated Goals  walking without fear, be more active    Currently in Pain?  Yes    Pain Score  5     Pain Location  Head    Pain Orientation  Right;Left;Anterior;Posterior    Pain Descriptors / Indicators  Aching    Aggravating Factors   Constant; no trigger     Multiple Pain Sites  No                       [facility] Adult PT Treatment/Exercise - 02/14/18 1120      Neuro Re-ed    Neuro Re-ed  Details   Alternating cone forward, lateral step overs x 15 reps at counter with gait belt and supervision  working on movement planning and taking time       Knee/Hip Exercises: Aerobic   Nustep  L4x6 min      Knee/Hip Exercises: Machines for Strengthening   Cybex Knee Extension  B LE's: 15# x 10 reps     Cybex Knee Flexion  B LE's: 15# x 10 rpes       Knee/Hip Exercises: Standing   SLS  B SLS at counter 2 x 20 sec; intermittent UE support       Knee/Hip Exercises: Seated   Other Seated Knee/Hip Exercises  B fitter (1 blue, 1 black band) x 10 reps     Hamstring Curl  Right;Left;15 reps;Strengthening    Hamstring Limitations  red TB              PT Education - 02/14/18 1152    Education provided  Yes    Education Details  HEP update     Person(s) Educated  Patient    Methods  Explanation;Demonstration;Verbal cues;Handout    Comprehension  Verbalized understanding;Returned demonstration;Verbal cues required;Need further instruction       PT Short Term Goals - 02/14/18 1109      PT SHORT TERM GOAL #1   Title  Patient to be independent with initial HEP.    Time  4    Period  Weeks    Status  On-going        PT Long Term Goals - 02/14/18 1109      PT LONG TERM GOAL #1   Title  Patient to be independent with advanced HEP.    Time  8    Period  Weeks    Status  On-going      PT LONG TERM GOAL #2   Title  Patient to demonstrate >/4+/5 strength in B LEs in order to improve stair climbing at home.    Time  8    Period  Weeks    Status  On-going      PT LONG TERM GOAL #3   Title  Patient to score 52/56 on Berg in order to demonstrate low risk of falls.    Time  8    Period  Weeks    Status  On-going      PT LONG TERM GOAL #4   Title  Patient to demonstrate anterior and lateral stepping over 10 in obstacle in order to clear ledges in her garden safely.    Time  8    Period  Weeks    Status  On-going            Plan - 02/14/18 1109    Clinical  Impression Statement  Pt. noting no recent falls however, some continued HA's today.  Pt. noting reduction in HA's following therex today.  Standing balance activities focused on improving pt. LE clearance with forward, lateral step-overs.  Pt. with tendency today for poor planning of step-over movement and quick pacing requiring cueing to improve movement quality and safety.  Pt. ended session without pain and HEP updated with seated HS curl as pt. with difficulty with previous HEP HS curl.  Will continue to progress toward goals.      PT Treatment/Interventions  ADLs/Self Care Home Management;Cryotherapy;Electrical Stimulation;Iontophoresis 4mg /ml Dexamethasone;Moist Heat;Ultrasound;Gait training;Stair training;Functional mobility training;Therapeutic activities;Therapeutic exercise;Manual techniques;Patient/family education;Neuromuscular re-education;Balance training;Passive range of motion;Dry needling;Energy conservation;Taping;Splinting;Vasopneumatic Device;Vestibular    Consulted and Agree with Plan of Care  Patient       Patient will benefit from skilled therapeutic intervention in order to improve the following deficits and impairments:  Decreased activity tolerance, Decreased strength, Pain, Decreased balance, Decreased mobility, Difficulty walking, Decreased range of motion, Postural dysfunction  Visit Diagnosis: Unsteadiness on feet  Muscle weakness (generalized)  Difficulty in walking, not elsewhere classified     Problem List Patient Active Problem List   Diagnosis Date Noted  . Osteopenia 01/25/2018  . 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 Dyspnea 09/22/2016  . OSA (obstructive sleep apnea) 08/31/2016  . PAF (paroxysmal atrial fibrillation) ([place]) 03/16/2016  . Hypothyroid 03/16/2016  . Obesity (BMI 30-39.9) 03/16/2016  . Essential hypertension 11/10/2015  . Hyperlipidemia 11/10/2015    Bess Harvest, PTA 02/14/18 12:01 PM  [facility] [address]  [place], [place], [address] Phone: [phone]   Fax:  [phone]  Name: Kasey Hansell MRN: [id number] Date of Birth: 1951/08/06

## 2018-02-18 ENCOUNTER — Encounter: Payer: Self-pay | Admitting: Physical Therapy

## 2018-02-18 ENCOUNTER — Ambulatory Visit: Payer: Medicare Other | Admitting: Physical Therapy

## 2018-02-18 DIAGNOSIS — R262 Difficulty in walking, not elsewhere classified: Secondary | ICD-10-CM

## 2018-02-18 DIAGNOSIS — R2681 Unsteadiness on feet: Secondary | ICD-10-CM | POA: Diagnosis not present

## 2018-02-18 DIAGNOSIS — M6281 Muscle weakness (generalized): Secondary | ICD-10-CM

## 2018-02-18 NOTE — Therapy (Signed)
[facility] [address]  [place], [place], [address] Phone: [phone]   Fax:  [phone]  Physical Therapy Treatment  Patient Details  Name: Lisa Acosta MRN: [id number] Date of Birth: 1950/09/16 Referring Provider: Lamar Blinks, MD   Encounter Date: 02/18/2018  PT End of Session - 02/18/18 1149    Visit Number  5    Number of Visits  17    Date for PT Re-Evaluation  03/27/18    Authorization Type  Medicare & M of O    PT Start Time  1103    PT Stop Time  1145    PT Time Calculation (min)  42 min    Equipment Utilized During Treatment  Gait belt    Activity Tolerance  Patient tolerated treatment well    Behavior During Therapy  WFL for tasks assessed/performed       Past Medical History:  Diagnosis Date  . Atrial fibrillation ([place])   . Depression   . Diverticulitis   . Diverticulosis   . Esophageal ulcer   . GERD (gastroesophageal reflux disease)   . Hyperlipidemia   . Hypertension   . Hypothyroid   . OSA (obstructive sleep apnea) 08/31/2016  . Osteopenia     Past Surgical History:  Procedure Laterality Date  . BREAST BIOPSY Left    needle core biopsy, benign  . CATARACT EXTRACTION    . HAND SURGERY    . KNEE SURGERY    . ROTATOR CUFF REPAIR    . TOTAL ABDOMINAL HYSTERECTOMY      There were no vitals filed for this visit.  Subjective Assessment - 02/18/18 1104    Subjective  Reports her R knee buckled on her Saturday but luckily she was honding onto something and caught herself. Reports knee has been bothering her quite a bit lately and may be contributing to her falls at home. Not having HAs as often.     Diagnostic tests  01/22/18 normal head CT    Patient Stated Goals  walking without fear, be more active    Currently in Pain?  Yes    Pain Score  5     Pain Location  Knee    Pain Orientation  Right    Pain Descriptors / Indicators  Aching    Pain Type  Chronic pain                        [facility] Adult PT Treatment/Exercise - 02/18/18 0001      Knee/Hip Exercises: Aerobic   Nustep  L4x6 min      Knee/Hip Exercises: Standing   Forward Step Up Limitations  10x each LE on airex CGA/ min A to recover balance x1    SLS  B SLS and tandem at counter to tolerance EO/EC; intermittent UE support; total 6 min VC/TCs to weight shift R/L and activate hips    SLS with Vectors  Each LE to multidirectional cones + calling out colors of cones; 5 min    Other Standing Knee Exercises  lateral stepping over cone; 10x each LE    Other Standing Knee Exercises  Standing on foam and reaching to cones anteriorly; 4 min      Knee/Hip Exercises: Seated   Other Seated Knee/Hip Exercises  B fitter (1 blue, 1 black band) x 10 reps                PT Short  Term Goals - 02/14/18 1109      PT SHORT TERM GOAL #1   Title  Patient to be independent with initial HEP.    Time  4    Period  Weeks    Status  On-going        PT Long Term Goals - 02/14/18 1109      PT LONG TERM GOAL #1   Title  Patient to be independent with advanced HEP.    Time  8    Period  Weeks    Status  On-going      PT LONG TERM GOAL #2   Title  Patient to demonstrate >/4+/5 strength in B LEs in order to improve stair climbing at home.    Time  8    Period  Weeks    Status  On-going      PT LONG TERM GOAL #3   Title  Patient to score 52/56 on Berg in order to demonstrate low risk of falls.    Time  8    Period  Weeks    Status  On-going      PT LONG TERM GOAL #4   Title  Patient to demonstrate anterior and lateral stepping over 10 in obstacle in order to clear ledges in her garden safely.    Time  8    Period  Weeks    Status  On-going            Plan - 02/18/18 1149    Clinical Impression Statement  Patient arrived to session with report that R knee buckled on her Saturday but was able to catch herself and did not result in a fall. Report she believes knee is  contributing to her falls. Today able to tolerate B LE strengthening without report of knee pain. Performed weight shifts anterior on foam with reaching to cones to simulate reaching in garden. Patient with hesitation to shift weight anteriorly but no LOB. Focused much of session today on SLS activities with tapping cones or static SLS. Required VC/TCs to "stand tall through leg" and activate hip/core to avoid Trendelenburg stance. Patient also requiring heavy cueing to shift weight fully to one leg before lifting the other and to widen BOS. Demonstrated LOB 1x when performing stepping up on foam with min A to recover. Attempted tandem stance with EC and UEs at counter for balance- advised patient not to perform his exercise at home yet d/t safety concerns. Patient reported understanding.    PT Treatment/Interventions  ADLs/Self Care Home Management;Cryotherapy;Electrical Stimulation;Iontophoresis 4mg /ml Dexamethasone;Moist Heat;Ultrasound;Gait training;Stair training;Functional mobility training;Therapeutic activities;Therapeutic exercise;Manual techniques;Patient/family education;Neuromuscular re-education;Balance training;Passive range of motion;Dry needling;Energy conservation;Taping;Splinting;Vasopneumatic Device;Vestibular    PT Next Visit Plan  gait outside on grass/curbs    Consulted and Agree with Plan of Care  Patient       Patient will benefit from skilled therapeutic intervention in order to improve the following deficits and impairments:  Decreased activity tolerance, Decreased strength, Pain, Decreased balance, Decreased mobility, Difficulty walking, Decreased range of motion, Postural dysfunction  Visit Diagnosis: Unsteadiness on feet  Muscle weakness (generalized)  Difficulty in walking, not elsewhere classified     Problem List Patient Active Problem List   Diagnosis Date Noted  . Osteopenia 01/25/2018  . 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 Dyspnea 09/22/2016  .  OSA (obstructive sleep apnea) 08/31/2016  . PAF (paroxysmal atrial fibrillation) ([place]) 03/16/2016  . Hypothyroid 03/16/2016  . Obesity (BMI 30-39.9) 03/16/2016  . Essential hypertension 11/10/2015  .  Hyperlipidemia 11/10/2015   Janene Harvey, PT, DPT 02/18/18 12:02 PM   [facility] [address]  [place], [place], [address] Phone: [phone]   Fax:  [phone]  Name: Jamayah Myszka MRN: [id number] Date of Birth: Feb 08, 1951

## 2018-02-21 ENCOUNTER — Ambulatory Visit: Payer: Medicare Other

## 2018-02-21 DIAGNOSIS — M6281 Muscle weakness (generalized): Secondary | ICD-10-CM

## 2018-02-21 DIAGNOSIS — R2681 Unsteadiness on feet: Secondary | ICD-10-CM | POA: Diagnosis not present

## 2018-02-21 DIAGNOSIS — R262 Difficulty in walking, not elsewhere classified: Secondary | ICD-10-CM | POA: Diagnosis not present

## 2018-02-21 NOTE — Therapy (Signed)
[facility] [address]  [place] Ac[place], [place], [address] Phone: [phone]   Fax:  [phone]  Physical Therapy Treatment  Patient Details  Name: Lisa Acosta MRN: [id number] Date of Birth: 12/20/1950 Referring Provider: Lamar Blinks, MD   Encounter Date: 02/21/2018  PT End of Session - 02/21/18 1126    Visit Number  6    Number of Visits  17    Date for PT Re-Evaluation  03/27/18    Authorization Type  Medicare & M of O    PT Start Time  1103    PT Stop Time  1146    PT Time Calculation (min)  43 min    Equipment Utilized During Treatment  Gait belt    Activity Tolerance  Patient tolerated treatment well    Behavior During Therapy  WFL for tasks assessed/performed       Past Medical History:  Diagnosis Date  . Atrial fibrillation ([place])   . Depression   . Diverticulitis   . Diverticulosis   . Esophageal ulcer   . GERD (gastroesophageal reflux disease)   . Hyperlipidemia   . Hypertension   . Hypothyroid   . OSA (obstructive sleep apnea) 08/31/2016  . Osteopenia     Past Surgical History:  Procedure Laterality Date  . BREAST BIOPSY Left    needle core biopsy, benign  . CATARACT EXTRACTION    . HAND SURGERY    . KNEE SURGERY    . ROTATOR CUFF REPAIR    . TOTAL ABDOMINAL HYSTERECTOMY      There were no vitals filed for this visit.  Subjective Assessment - 02/21/18 1123    Subjective  Pt. doing well with no recent falls.  Did shop, "all day" yesterday and notes fatigue with leg muscles.      Diagnostic tests  01/22/18 normal head CT    Patient Stated Goals  walking without fear, be more active    Currently in Pain?  Yes    Pain Score  3     Pain Location  Knee    Pain Orientation  Right    Pain Descriptors / Indicators  Aching    Pain Type  Chronic pain    Pain Frequency  Intermittent    Aggravating Factors   using it, walking, standing     Multiple Pain Sites  No                        [facility] Adult PT Treatment/Exercise - 02/21/18 1144      Ambulation/Gait   Ambulation/Gait  Yes    Ambulation/Gait Assistance  5: Supervision    Ambulation Distance (Feet)  500 Feet    Assistive device  Straight cane    Gait Pattern  Step-through pattern poor R LE clearance     Gait Comments  Cues for increased R LE clearance and to take her time as pt. tends to rush through activities      Knee/Hip Exercises: Aerobic   Nustep  L4x6 min      Knee/Hip Exercises: Standing   Other Standing Knee Exercises  6" R bolster step-over onto foam with cross-body reach to cone on foam bolster 2 x 10 reps  supervision; to simulate stepping and reaching in garden     Other Standing Knee Exercises  Side step up/over, forward step up/over airex pad x 10 reps each way; [place]  supervision  Knee/Hip Exercises: Supine   Bridges  10 reps;Both    Other Supine Knee/Hip Exercises  Hooklying bridge + DF x 10 rpes       Knee/Hip Exercises: Sidelying   Hip ABduction  Right;10 reps;Strengthening    Hip ABduction Limitations  Cues required for positioning     Hip ADduction  Right;10 reps    Hip ADduction Limitations  Cues required for positioning              PT Education - 02/21/18 1150    Education provided  Yes    Education Details  HEP update     Person(s) Educated  Patient    Methods  Explanation;Demonstration;Verbal cues;Handout    Comprehension  Verbalized understanding;Returned demonstration;Verbal cues required;Need further instruction       PT Short Term Goals - 02/21/18 1134      PT SHORT TERM GOAL #1   Title  Patient to be independent with initial HEP.    Time  4    Period  Weeks    Status  Achieved        PT Long Term Goals - 02/14/18 1109      PT LONG TERM GOAL #1   Title  Patient to be independent with advanced HEP.    Time  8    Period  Weeks    Status  On-going      PT LONG TERM GOAL #2   Title  Patient to demonstrate >/4+/5  strength in B LEs in order to improve stair climbing at home.    Time  8    Period  Weeks    Status  On-going      PT LONG TERM GOAL #3   Title  Patient to score 52/56 on Berg in order to demonstrate low risk of falls.    Time  8    Period  Weeks    Status  On-going      PT LONG TERM GOAL #4   Title  Patient to demonstrate anterior and lateral stepping over 10 in obstacle in order to clear ledges in her garden safely.    Time  8    Period  Weeks    Status  On-going            Plan - 02/21/18 1134    Clinical Impression Statement  Trishna doing well today however noting some LE fatigue after, "shopping eight hours" yesterday.  Tolerated all standing balance and supine hip strengthening activities in session on compliant surfaces well today.  Focused on balance on compliant surface with stepping activities as pt. demo'd some instability with SPC with ambulation outdoors on uneven surfaces today.  Pt. encouraged to plan movements and, "take your time" with standing activities as pt. tends to rush through activities with poor movement planning and poor LE clearance.  Pt. verbalizing that she feels she is making progress with therapy.  Will continue to work toward existing goals.      PT Treatment/Interventions  ADLs/Self Care Home Management;Cryotherapy;Electrical Stimulation;Iontophoresis 4mg /ml Dexamethasone;Moist Heat;Ultrasound;Gait training;Stair training;Functional mobility training;Therapeutic activities;Therapeutic exercise;Manual techniques;Patient/family education;Neuromuscular re-education;Balance training;Passive range of motion;Dry needling;Energy conservation;Taping;Splinting;Vasopneumatic Device;Vestibular    Consulted and Agree with Plan of Care  Patient       Patient will benefit from skilled therapeutic intervention in order to improve the following deficits and impairments:  Decreased activity tolerance, Decreased strength, Pain, Decreased balance, Decreased mobility,  Difficulty walking, Decreased range of motion, Postural dysfunction  Visit Diagnosis: Unsteadiness on feet  Muscle weakness (generalized)  Difficulty in walking, not elsewhere classified     Problem List Patient Active Problem List   Diagnosis Date Noted  . Osteopenia 01/25/2018  . 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 Dyspnea 09/22/2016  . OSA (obstructive sleep apnea) 08/31/2016  . PAF (paroxysmal atrial fibrillation) ([place]) 03/16/2016  . Hypothyroid 03/16/2016  . Obesity (BMI 30-39.9) 03/16/2016  . Essential hypertension 11/10/2015  . Hyperlipidemia 11/10/2015    Bess Harvest, PTA 02/21/18 12:03 PM  [facility] [address]  [place], [place], [address] Phone: [phone]   Fax:  [phone]  Name: Ouida Abeyta MRN: [id number] Date of Birth: 07-15-1951

## 2018-02-25 ENCOUNTER — Encounter: Payer: Self-pay | Admitting: Physical Therapy

## 2018-02-25 ENCOUNTER — Ambulatory Visit: Payer: Medicare Other | Admitting: Physical Therapy

## 2018-02-25 DIAGNOSIS — R262 Difficulty in walking, not elsewhere classified: Secondary | ICD-10-CM

## 2018-02-25 DIAGNOSIS — M6281 Muscle weakness (generalized): Secondary | ICD-10-CM | POA: Diagnosis not present

## 2018-02-25 DIAGNOSIS — R2681 Unsteadiness on feet: Secondary | ICD-10-CM

## 2018-02-25 NOTE — Therapy (Signed)
[facility] [address]  [place], [place], [address] Phone: [phone]   Fax:  [phone]  Physical Therapy Treatment  Patient Details  Name: Lisa Acosta MRN: [id number] Date of Birth: [birth year] Referring Provider: Lamar Blinks, MD   Encounter Date: 02/25/2018  PT End of Session - 02/25/18 1057    Visit Number  7    Number of Visits  17    Date for PT Re-Evaluation  03/27/18    Authorization Type  Medicare & M of O    PT Start Time  1015    PT Stop Time  1051    PT Time Calculation (min)  36 min    Equipment Utilized During Treatment  Gait belt    Activity Tolerance  Patient tolerated treatment well    Behavior During Therapy  [facility] for tasks assessed/performed       Past Medical History:  Diagnosis Date  . Atrial fibrillation ([place])   . Depression   . Diverticulitis   . Diverticulosis   . Esophageal ulcer   . GERD (gastroesophageal reflux disease)   . Hyperlipidemia   . Hypertension   . Hypothyroid   . OSA (obstructive sleep apnea) 08/31/2016  . Osteopenia     Past Surgical History:  Procedure Laterality Date  . BREAST BIOPSY Left    needle core biopsy, benign  . CATARACT EXTRACTION    . HAND SURGERY    . KNEE SURGERY    . ROTATOR CUFF REPAIR    . TOTAL ABDOMINAL HYSTERECTOMY      There were no vitals filed for this visit.  Subjective Assessment - 02/25/18 1018    Subjective  Reports that she lost HEP paper and having trouble performing exercises on her bed. Said she was walking on hill on the garden path and tripped, had [place] with her and was able to catch herself.     Diagnostic tests  01/22/18 normal head CT    Patient Stated Goals  walking without fear, be more active    Currently in Pain?  Yes    Pain Score  4     Pain Location  Knee    Pain Orientation  Right    Pain Descriptors / Indicators  Aching    Pain Type  Chronic pain                       [facility]  Adult PT Treatment/Exercise - 02/25/18 0001      Ambulation/Gait   Ambulation/Gait  Yes    Ambulation/Gait Assistance  4: Min guard    Ambulation Distance (Feet)  600 Feet    Assistive device  Straight cane    Gait Pattern  Step-through pattern;Ataxic;Lateral trunk lean to right;Poor foot clearance - right;Poor foot clearance - left intermittently poor foot clearance B LEs    Stairs  Yes    Stairs Assistance  4: Min guard    Stair Management Technique  One rail Left    Number of Stairs  13    Height of Stairs  8    Curb  4: Min assist    Gait Comments  VCs for cane management and sequencing up/down stairs; VCs for bringing SPC along, sequencing, focusing on task with gait training.      Knee/Hip Exercises: Aerobic   Nustep  L4x6 min      Knee/Hip Exercises: Standing   Functional Squat  10 reps;Limitations  Functional Squat Limitations  B UEs on chair for support    Other Standing Knee Exercises  Side stepping with red TB and CGA; 4x35ft VCs to keep knees straight      Knee/Hip Exercises: Sidelying   Hip ABduction  10 reps;Strengthening;Both;2 sets    Hip ABduction Limitations  TCs for positioning               PT Short Term Goals - 02/21/18 1134      PT SHORT TERM GOAL #1   Title  Patient to be independent with initial HEP.    Time  4    Period  Weeks    Status  Achieved        PT Long Term Goals - 02/14/18 1109      PT LONG TERM GOAL #1   Title  Patient to be independent with advanced HEP.    Time  8    Period  Weeks    Status  On-going      PT LONG TERM GOAL #2   Title  Patient to demonstrate >/4+/5 strength in B LEs in order to improve stair climbing at home.    Time  8    Period  Weeks    Status  On-going      PT LONG TERM GOAL #3   Title  Patient to score 52/56 on Berg in order to demonstrate low risk of falls.    Time  8    Period  Weeks    Status  On-going      PT LONG TERM GOAL #4   Title  Patient to demonstrate anterior and lateral  stepping over 10 in obstacle in order to clear ledges in her garden safely.    Time  8    Period  Weeks    Status  On-going            Plan - 02/25/18 1059    Clinical Impression Statement  Patient arrived to session with report that she had a near fall last week but had her cane and was able to avoid a fall. Today patient tolerated gait and stair training- VCs for cane management and sequencing up/down stairs provided and VCs for bringing SPC along, sequencing, focusing on task with gait training provided. Patient with difficulty with a consistent gait pattern when a cognitive challenge introduced- will benefit from continued dual task training in the future. Patient with report that she lost her HEP sheet- given a new one. Reviewed hip abduction in sidelying- patient with good carryover from last treatment session. Able to perform lateral stepping over cone this date with improvement in foot clearance and no LOB. Spoke with patient about continued use of SPC in the community, especially on compliant surfaces; patient reported understanding.     PT Treatment/Interventions  ADLs/Self Care Home Management;Cryotherapy;Electrical Stimulation;Iontophoresis 4mg /ml Dexamethasone;Moist Heat;Ultrasound;Gait training;Stair training;Functional mobility training;Therapeutic activities;Therapeutic exercise;Manual techniques;Patient/family education;Neuromuscular re-education;Balance training;Passive range of motion;Dry needling;Energy conservation;Taping;Splinting;Vasopneumatic Device;Vestibular    PT Next Visit Plan  cognitive/dual task activities, getting up from the floor     Consulted and Agree with Plan of Care  Patient       Patient will benefit from skilled therapeutic intervention in order to improve the following deficits and impairments:  Decreased activity tolerance, Decreased strength, Pain, Decreased balance, Decreased mobility, Difficulty walking, Decreased range of motion, Postural  dysfunction  Visit Diagnosis: Unsteadiness on feet  Muscle weakness (generalized)  Difficulty in walking, not elsewhere classified  Problem List Patient Active Problem List   Diagnosis Date Noted  . Osteopenia 01/25/2018  . 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 Dyspnea 09/22/2016  . OSA (obstructive sleep apnea) 08/31/2016  . PAF (paroxysmal atrial fibrillation) ([place]) 03/16/2016  . Hypothyroid 03/16/2016  . Obesity (BMI 30-39.9) 03/16/2016  . Essential hypertension 11/10/2015  . Hyperlipidemia 11/10/2015    Janene Harvey, PT, DPT 02/25/18 11:13 AM  [facility] [address]  [place], [place], [address] Phone: [phone]   Fax:  [phone]  Name: Lisa Acosta MRN: [id number] Date of Birth: 10-28-1950

## 2018-02-28 ENCOUNTER — Ambulatory Visit: Payer: Medicare Other

## 2018-02-28 DIAGNOSIS — R2681 Unsteadiness on feet: Secondary | ICD-10-CM | POA: Diagnosis not present

## 2018-02-28 DIAGNOSIS — M6281 Muscle weakness (generalized): Secondary | ICD-10-CM

## 2018-02-28 DIAGNOSIS — R262 Difficulty in walking, not elsewhere classified: Secondary | ICD-10-CM

## 2018-02-28 NOTE — Therapy (Addendum)
[facility] [address]  [place], [place], [address] Phone: [phone]   Fax:  [phone]  Physical Therapy Treatment  Patient Details  Name: Lisa Acosta MRN: [id number] Date of Birth: [birth year] Referring Provider: Lamar Blinks, MD   Encounter Date: 02/28/2018  PT End of Session - 02/28/18 0953    Visit Number  8    Number of Visits  17    Date for PT Re-Evaluation  03/27/18    Authorization Type  Medicare & M of O    PT Start Time  0933    PT Stop Time  1015    PT Time Calculation (min)  42 min    Equipment Utilized During Treatment  Gait belt    Activity Tolerance  Patient tolerated treatment well    Behavior During Therapy  [facility] for tasks assessed/performed       Past Medical History:  Diagnosis Date  . Atrial fibrillation ([place])   . Depression   . Diverticulitis   . Diverticulosis   . Esophageal ulcer   . GERD (gastroesophageal reflux disease)   . Hyperlipidemia   . Hypertension   . Hypothyroid   . OSA (obstructive sleep apnea) 08/31/2016  . Osteopenia     Past Surgical History:  Procedure Laterality Date  . BREAST BIOPSY Left    needle core biopsy, benign  . CATARACT EXTRACTION    . HAND SURGERY    . KNEE SURGERY    . ROTATOR CUFF REPAIR    . TOTAL ABDOMINAL HYSTERECTOMY      There were no vitals filed for this visit.  Subjective Assessment - 02/28/18 0941    Subjective  Pt. doing well today.  Shopped, "All day yesterday" and reports she felt fine.      Diagnostic tests  01/22/18 normal head CT    Patient Stated Goals  walking without fear, be more active    Currently in Pain?  Yes    Pain Score  2     Pain Location  Knee    Pain Orientation  Right    Pain Descriptors / Indicators  Aching    Pain Type  Chronic pain    Pain Frequency  Intermittent    Multiple Pain Sites  No                       [facility] Adult PT Treatment/Exercise - 02/28/18 1457      Transfers   Transfers  Floor to Transfer    Floor to Transfer  5: Supervision    Floor to Transfer Details (indicate cue type and reason)  Transfer from floor to mat table and floor to standing with UE support on chair for improve independence recovery from falls       Ambulation/Gait   Ambulation/Gait  Yes    Ambulation/Gait Assistance  5: Supervision    Ambulation Distance (Feet)  200 Feet    Assistive device  Straight cane    Gait Pattern  Step-through pattern;Ataxic;Lateral trunk lean to right;Poor foot clearance - right;Poor foot clearance - left    Pre-Gait Activities  alternating high knee march with cognitive tasks working on maintain straight line march and focusing on LE clearance     Gait Comments  VC for proper SPC sequencing while engaging in cognitive tasks; albe to maintain proper SPC sequencing without cognitive tasks       Self-Care   Self-Care  Other Self-Care Comments  Other Self-Care Comments   Straight line gait in hallway 4 x 100 ft with cognitive tasks such as counting, animal name recall with alphabet; some difficulty maintaining proper sequencing with SPC with this       Knee/Hip Exercises: Aerobic   Recumbent Bike  Lvl 1, 6 min       Knee/Hip Exercises: Standing   Heel Raises  20 reps;3 seconds at TM    Functional Squat  15 reps;3 seconds Heavy cues for proper technique     Functional Squat Limitations  at TM    Other Standing Knee Exercises  6" R bolster step-over onto foam with cross-body reach to cone on foam bolster 2 x 15 reps  Supervision     Other Standing Knee Exercises  Side stepping with red TB at ankles at wall (intermittent UE support 2 x 20 ft                PT Short Term Goals - 02/21/18 1134      PT SHORT TERM GOAL #1   Title  Patient to be independent with initial HEP.    Time  4    Period  Weeks    Status  Achieved        PT Long Term Goals - 02/14/18 1109      PT LONG TERM GOAL #1   Title  Patient to be independent with  advanced HEP.    Time  8    Period  Weeks    Status  On-going      PT LONG TERM GOAL #2   Title  Patient to demonstrate >/4+/5 strength in B LEs in order to improve stair climbing at home.    Time  8    Period  Weeks    Status  On-going      PT LONG TERM GOAL #3   Title  Patient to score 52/56 on Berg in order to demonstrate low risk of falls.    Time  8    Period  Weeks    Status  On-going      PT LONG TERM GOAL #4   Title  Patient to demonstrate anterior and lateral stepping over 10 in obstacle in order to clear ledges in her garden safely.    Time  8    Period  Weeks    Status  On-going            Plan - 02/28/18 1509    Clinical Impression Statement  Lisa Acosta doing well today noting she was able to shop "all day" with friend yesterday without issue.  Session focusing on review of proper technique with floor transfers and cognitive tasks with gait to reduce risks of falls and to improve recovery following falls.  Pt. tolerated all activities in session well however continues to demo poor movement planning with step and reach activities.  Does occasionally require cueing to stay on task and focus on proper movement pattern while multi-tasking with conversation as this is when pt. demo's poor LE clearance and altered judement.  Pt. will continue to benefit from further skilled therapy to improve safety with gait and improve functional independance.      PT Treatment/Interventions  ADLs/Self Care Home Management;Cryotherapy;Electrical Stimulation;Iontophoresis 32m/ml Dexamethasone;Moist Heat;Ultrasound;Gait training;Stair training;Functional mobility training;Therapeutic activities;Therapeutic exercise;Manual techniques;Patient/family education;Neuromuscular re-education;Balance training;Passive range of motion;Dry needling;Energy conservation;Taping;Splinting;Vasopneumatic Device;Vestibular    PT Next Visit Plan  cognitive/dual task activities; step and reach activities; Reassess HEP  difficulty  Consulted and Agree with Plan of Care  Patient       Patient will benefit from skilled therapeutic intervention in order to improve the following deficits and impairments:  Decreased activity tolerance, Decreased strength, Pain, Decreased balance, Decreased mobility, Difficulty walking, Decreased range of motion, Postural dysfunction  Visit Diagnosis: Unsteadiness on feet  Muscle weakness (generalized)  Difficulty in walking, not elsewhere classified     Problem List Patient Active Problem List   Diagnosis Date Noted  . Osteopenia 01/25/2018  . 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 Dyspnea 09/22/2016  . OSA (obstructive sleep apnea) 08/31/2016  . PAF (paroxysmal atrial fibrillation) ([place]) 03/16/2016  . Hypothyroid 03/16/2016  . Obesity (BMI 30-39.9) 03/16/2016  . Essential hypertension 11/10/2015  . Hyperlipidemia 11/10/2015    Bess Harvest, PTA 02/28/18 3:15 PM   [facility] [address]  [place], [place], [address] Phone: [phone]   Fax:  [phone]  Name: Lisa Acosta MRN: [id number] Date of Birth: [birth year]  PHYSICAL THERAPY DISCHARGE SUMMARY  Visits from Start of Care: 8  Current functional level related to goals / functional outcomes: Unable to assess, patient did not return secondary to getting an MRI for knee   Remaining deficits: Unable to assess   Education / Equipment: See above  Plan: Patient agrees to discharge.  Patient goals were not met. Patient is being discharged due to a change in medical status.  ?????     Janene Harvey, PT, DPT 04/11/18 4:36 PM

## 2018-03-04 ENCOUNTER — Ambulatory Visit: Payer: Medicare Other | Admitting: Physical Therapy

## 2018-03-04 DIAGNOSIS — M1711 Unilateral primary osteoarthritis, right knee: Secondary | ICD-10-CM | POA: Diagnosis not present

## 2018-03-07 ENCOUNTER — Ambulatory Visit: Payer: Medicare Other

## 2018-03-11 DIAGNOSIS — M25561 Pain in right knee: Secondary | ICD-10-CM | POA: Diagnosis not present

## 2018-03-11 DIAGNOSIS — M542 Cervicalgia: Secondary | ICD-10-CM | POA: Diagnosis not present

## 2018-03-18 ENCOUNTER — Telehealth: Payer: Self-pay | Admitting: *Deleted

## 2018-03-18 DIAGNOSIS — S83241A Other tear of medial meniscus, current injury, right knee, initial encounter: Secondary | ICD-10-CM | POA: Diagnosis not present

## 2018-03-18 DIAGNOSIS — M1711 Unilateral primary osteoarthritis, right knee: Secondary | ICD-10-CM | POA: Diagnosis not present

## 2018-03-18 NOTE — Telephone Encounter (Signed)
Sent fax via Epic to [facility] Dr. Herschell Dimes office about pre-op clearance appointment

## 2018-03-18 NOTE — Telephone Encounter (Signed)
Spoke with pt to make aware of pre-operative clearance appointment on 03/22/18 at 2:00 pm with Kerin Ransom, PA. Pt verbalized understanding.

## 2018-03-18 NOTE — Telephone Encounter (Signed)
   Primary Cardiologist:No primary care provider on file.  Chart reviewed as part of pre-operative protocol coverage. Because of Lisa Acosta's past medical history and time since last visit, he/she will require a follow-up visit in order to better assess preoperative cardiovascular risk.  Pre-op covering staff: - Please schedule appointment and call patient to inform them. - Please contact requesting surgeon's office via preferred method (i.e, phone, fax) to inform them of need for appointment prior to surgery.  Cecilie Kicks, NP  03/18/2018, 3:58 PM

## 2018-03-18 NOTE — Telephone Encounter (Signed)
   [facility] Pre-operative Risk Assessment    Request for surgical clearance:  1. What type of surgery is being performed? RIGHT KNEE ARTHROSCOPY    2. When is this surgery scheduled? TBD   3. What type of clearance is required (medical clearance vs. Pharmacy clearance to hold med vs. Both)? BOTH  4. Are there any medications that need to be held prior to surgery and how long?[place]   5. Practice name and name of physician performing surgery? [facility]    6. What is your office phone number? [phone]    7.   What is your office fax number?  [place]   8.   Anesthesia type (None, local, MAC,  general) ? UNKNOWN

## 2018-03-19 NOTE — Telephone Encounter (Signed)
Dr. Stanford Breed pt needs to have Rt knee arthroscopy, we made an appt but she cannot come that day.  She has not been seen by [facility] since 05/2017.  Do you feel comfortable clearing her or should we just reschedule her?  Thank you. [name]

## 2018-03-19 NOTE — Telephone Encounter (Signed)
Pt was sched for pre-op appt 7.12.19 and stating now she can't come because she have to pick up her grandson. I offered pt an appt for next month and she said absolutely not because she needed to have her surgery soon. Advised pt I would send a message back to the pre-op pool to see if they can call her with sooner appt.

## 2018-03-19 NOTE — Telephone Encounter (Signed)
Please let pt know we cannot clear her for surgery until she is seen in the office.  If she cannot come on the 12th please arrange another day.  Thank you.

## 2018-03-19 NOTE — Telephone Encounter (Signed)
Follow Up:+     Returning your call, she said to please let her phone ring a little linger. It takes her a little longer to get to the phone.

## 2018-03-19 NOTE — Telephone Encounter (Signed)
Left message for pt to call back  °

## 2018-03-19 NOTE — Telephone Encounter (Signed)
Needs ov Lisa Acosta  

## 2018-03-20 NOTE — Telephone Encounter (Signed)
Pt is scheduled to see Jory Sims on 04/10/18. Clearance will be addressed at visit

## 2018-03-22 ENCOUNTER — Ambulatory Visit: Payer: Medicare Other | Admitting: Cardiology

## 2018-04-02 ENCOUNTER — Telehealth: Payer: Self-pay | Admitting: Internal Medicine

## 2018-04-02 ENCOUNTER — Other Ambulatory Visit: Payer: Self-pay | Admitting: Family Medicine

## 2018-04-02 MED ORDER — LOVASTATIN 40 MG PO TABS: 40.0000 mg | ORAL_TABLET | Freq: Every day | ORAL | 3 refills | Status: DC

## 2018-04-02 NOTE — Telephone Encounter (Signed)
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 Records have been received and placed on Dr.Perry's desk for review.

## 2018-04-04 NOTE — Telephone Encounter (Signed)
Records reviewed. This patient established with me less than 2 years ago after her doctor in [facility] [place] retired. Then she decided to establish GI care again in [facility] shortly thereafter. She has been scoped there and was seen by that office within the past few months. Unfortunately, the busy nature of my practice does not allow me to accommodate this request Sorry.

## 2018-04-05 NOTE — Telephone Encounter (Signed)
Notified patient of Dr.Perry's recommendation patient lives in [place] and may want to have records reviewed by an MD in [facility] patient will call back and let us know who she would like records reviewed by next.

## 2018-04-08 NOTE — [place] Notes (Signed)
Cardiology Office Note   Date:  04/10/2018   ID:  Lisa Acosta, DOB 06-Feb-1951, MRN [id number]  PCP:  Darreld Mclean, MD  Cardiologist: Dr. Stanford Breed  Chief Complaint  Patient presents with  . preop clearance    denies chest pains, SOB, swelling in hands/feet     History of Present Illness: Lisa Acosta is a 67 y.o. female who presents for ongoing assessment and management of PAF, echo with Grade I diastolic dysfunction, trace mitral and tricuspid regurgitation. TSH normal in March of 2019 at 0.94. She was last seen by Dr. Stanford Breed on   06/06/2018 and was continued on Xarelto and flecainide.She was scheduled for a GXT to exclude exercise induced ventricular arrhythmias on flecainide. .She also has a history of HTN, hyperlipidemia, depression, thyroid disease, OSA,  and GERD.   Study Highlights     Blood pressure demonstrated a normal response to exercise.  There was no ST segment deviation noted during stress.   ETT with mildly impaired exercise tolerance (4:20); no chest pain; normal BP response (SBP of 81 felt to be inaccurate); no ST changes; no exercise induced arrhythmias; negative inadequate ETT (pt achieved THR of 63% predicted).   She is here today for pre-operative cardiac evaluation. She is to have right knee surgery for a torn meniscus, by Dr. Theda Sers of [facility]. Date is to be determined.   She has no cardiac complaints. She has had a little upper airway chest congestion causing her to cough, but no significant dyspnea. She is inactive as she has fallen several times due to her knee injury. Once injuring her face and chipping her tooth. She did not have significant bleeding on Xarelto. Follow up head CT did no show active bleeding.   Past Medical History:  Diagnosis Date  . Atrial fibrillation ([place])   . Depression   . Diverticulitis   . Diverticulosis   . Esophageal ulcer   . GERD (gastroesophageal reflux disease)   . Hyperlipidemia   .  Hypertension   . Hypothyroid   . OSA (obstructive sleep apnea) 08/31/2016  . Osteopenia     Past Surgical History:  Procedure Laterality Date  . BREAST BIOPSY Left    needle core biopsy, benign  . CATARACT EXTRACTION    . HAND SURGERY    . KNEE SURGERY    . ROTATOR CUFF REPAIR    . TOTAL ABDOMINAL HYSTERECTOMY       Current Outpatient Medications  Medication Sig Dispense Refill  . amLODipine (NORVASC) 5 MG tablet Take 1 tablet (5 mg total) by mouth daily. <PLEASE MAKE APPOINTMENT FOR REFILLS> 30 tablet 0  . atenolol (TENORMIN) 50 MG tablet TAKE 1 TABLET DAILY 90 tablet 1  . BIOTIN PO Take 1 tablet by mouth daily.    . Calcium Carbonate-Vitamin D (CALTRATE 600+D) 600-400 MG-UNIT tablet Take 1 tablet by mouth 2 (two) times daily.    . Cholecalciferol (D 2000) 2000 units TABS Take 1 tablet by mouth daily.    . flecainide (TAMBOCOR) 100 MG tablet Take 1 tablet (100 mg total) by mouth 2 (two) times daily. 180 tablet 3  . FLUoxetine (PROZAC) 40 MG capsule Take 1 capsule (40 mg total) by mouth 2 (two) times daily. 180 capsule 3  . Krill Oil 1000 MG CAPS Take 1 capsule by mouth daily.    [name] lactobacillus acidophilus (BACID) TABS tablet Take 1 tablet by mouth daily.    [name] levothyroxine (SYNTHROID, LEVOTHROID) 125 MCG tablet Take 1 tablet (125 mcg  total) by mouth daily before breakfast. 90 tablet 1  . lovastatin (MEVACOR) 40 MG tablet Take 1 tablet (40 mg total) by mouth at bedtime. 90 tablet 3  . LUTEIN PO Take 1 capsule by mouth daily.    . magnesium 30 MG tablet Take 30 mg by mouth daily.    [name] omeprazole (PRILOSEC) 40 MG capsule Take 40 mg by mouth 2 (two) times daily.    . Potassium 75 MG TABS Take 1 tablet by mouth daily.    . traZODone (DESYREL) 50 MG tablet Take 1 tablet (50 mg total) by mouth at bedtime. 90 tablet 3  . Turmeric 450 MG CAPS Take 1 capsule by mouth daily.    [name] 20 MG TABS tablet TAKE 1 TABLET DAILY WITH   SUPPER 90 tablet 3   No current facility-administered  medications for this visit.     Allergies:   Dextran; Dextrans; Dextromethorphan hbr; Tree extract; Penicillin g; Penicillins; Sulfa antibiotics; and Sulfacetamide sodium    Social History:  The patient  reports that she has never smoked. She has never used smokeless tobacco. She reports that she drinks alcohol. She reports that she does not use drugs.   Family History:  The patient's family history includes Atrial fibrillation in her mother; Congestive Heart Failure in her mother; Heart disease in her brother, brother, and father.    ROS: All other systems are reviewed and negative. Unless otherwise mentioned in H&P    PHYSICAL EXAM: VS:  BP 128/72 (BP Location: Left Arm, Patient Position: Sitting)   Pulse (!) 51   Ht 5\' 1"  (1.549 m)   Wt 197 lb 3.2 oz (89.4 kg)   BMI 37.26 kg/m  , BMI Body mass index is 37.26 kg/m. GEN: Well nourished, well developed, in no acute distress  HEENT: normal  Neck: no JVD, carotid bruits, or masses Cardiac: IRRR; no murmurs, rubs, or gallops,no edema  Respiratory:  clear to auscultation bilaterally, normal work of breathing GI: soft, nontender, nondistended, + BS MS: no deformity or atrophy  Skin: warm and dry, no rash Neuro:  Strength and sensation are intact Psych: euthymic mood, full affect   EKG: Sinus bradycardia with 1st degree AV block. Rate of 52 bpm. QTc 455 ms.   Recent Labs: 08/13/2017: TSH 1.81 08/21/2017: ALT 20; BUN 20; Creatinine, Ser 0.74; Hemoglobin 13.6; Platelets 259; Potassium 3.8; Sodium 139    Lipid Panel    Component Value Date/Time   CHOL 192 01/22/2017 1559   TRIG 207.0 (H) 01/22/2017 1559   HDL 55.90 01/22/2017 1559   CHOLHDL 3 01/22/2017 1559   VLDL 41.4 (H) 01/22/2017 1559   LDLDIRECT 112.0 01/22/2017 1559      Wt Readings from Last 3 Encounters:  04/10/18 197 lb 3.2 oz (89.4 kg)  01/28/18 198 lb 12.8 oz (90.2 kg)  01/22/18 200 lb (90.7 kg)      Other studies Reviewed: Echocardiogram    11/12/2015 Left ventricle: The cavity size was normal. Wall thickness was   normal. Systolic function was normal. The estimated ejection   fraction was in the range of 55% to 60%. Wall motion was normal;   there were no regional wall motion abnormalities. Doppler   parameters are consistent with abnormal left ventricular   relaxation (grade 1 diastolic dysfunction).  Impressions:  - Normal LV systolic function; grade 1 diastolic dysfunction; trace   MR and TR.   ASSESSMENT AND PLAN:  1. Pre-Operative Cardiac Clearance:   Chart reviewed as part  of pre-operative protocol coverage. She is also examined here in the office.  Given past medical history and examination today, based on ACC/AHA guidelines, Lisa Acosta would be at acceptable risk for the planned procedure without further cardiovascular testing. She will be required to hold Xarelto a minimum of 24 hours prior to surgery. Resume ASAP post procedure.   2. Paraoxysmal Atrial fib: In NSR today, on auscultation some irregular rhythm was noted. She states that she occasionally feels palpitations but no sustained. She is stable concerning HR. EKG is reviewed as above. Continue Xarelto with stipulations as above. Samples are provided as she is now in doughnut hole. Information given to her for drug assistance.   3. Hypertension: BP is well controlled currently. No changes in her regimen at this time.    Current medicines are reviewed at length with the patient today.    Labs/ tests ordered today include: None to have pre-op labs per surgery.   Phill Myron. West Pugh, ANP, AACC   04/10/2018 8:42 AM    [facility] [address] [place], [place] [address] Phone: [phone]; Fax: [phone]

## 2018-04-10 ENCOUNTER — Ambulatory Visit (INDEPENDENT_AMBULATORY_CARE_PROVIDER_SITE_OTHER): Payer: Medicare Other | Admitting: Adult Health

## 2018-04-10 ENCOUNTER — Encounter: Payer: Self-pay | Admitting: Adult Health

## 2018-04-10 VITALS — BP 128/72 | HR 51 | Ht 61.0 in | Wt 197.2 lb

## 2018-04-10 DIAGNOSIS — I10 Essential (primary) hypertension: Secondary | ICD-10-CM

## 2018-04-10 DIAGNOSIS — I480 Paroxysmal atrial fibrillation: Secondary | ICD-10-CM

## 2018-04-10 DIAGNOSIS — Z01810 Encounter for preprocedural cardiovascular examination: Secondary | ICD-10-CM | POA: Diagnosis not present

## 2018-04-10 NOTE — Patient Instructions (Addendum)
Medication Instructions:  NO CHANGES- Your physician recommends that you continue on your current medications as directed. Please refer to the Current Medication list given to you today.  If you need a refill on your cardiac medications before your next appointment, please call your pharmacy.   Special Instructions: CLEARED FOR RIGHT KNEE ARTHROSCOPY @ [facility] DR Hart Robinsons    Follow-Up: Your physician wants you to follow-up in: [place] should receive a reminder letter in the mail two months in advance. If you do not receive a letter, please call our office NOV 2019 to schedule the JAN 2020 follow-up appointment.   Thank you for choosing [facility] at [facility]!!

## 2018-04-13 NOTE — [place] Notes (Signed)
[place] at [facility] [address], [place], [place] [address] [phone]  Date:  04/15/2018   Name:  Lisa Acosta   DOB:  04-26-1951   MRN:  [id number]  PCP:  Darreld Mclean, MD    Chief Complaint: Fall (3 month follow up); Depression (sleeping, not eating); and GI referral (acid reflux, ulcers, diverticulosis)   History of Present Illness:  Lisa Acosta is a 67 y.o. very pleasant female patient who presents with the following: Following up today History of OA, a fib, hypothyroidism, obesity, HTN, hyperlipidemia Last seen here in May after she fell.  We sent her to PT to work on her balance as she was feeling nervous about walking after her fall.  She also injured her knee and is going to have surgery soon per Dr. Theda Sers - she is going to have a RIGHT knee scope for suspected cartilage tear,  She is having "constant pain" from her knee and still feels like she is not able to walk steadily without her cane   Per recent cardiology note-  History of Present Illness: Lisa Acosta is a 67 y.o. female who presents for ongoing assessment and management of PAF, echo with Grade I diastolic dysfunction, trace mitral and tricuspid regurgitation. TSH normal in March of 2019 at 0.94. She was last seen by Dr. Stanford Breed on   06/06/2018 and was continued on Xarelto and flecainide.She was scheduled for a GXT to exclude exercise induced ventricular arrhythmias on flecainide. .She also has a history of HTN, hyperlipidemia, depression, thyroid disease, OSA,  and GERD.  Study Highlights     Blood pressure demonstrated a normal response to exercise.  There was no ST segment deviation noted during stress.  ETT with mildly impaired exercise tolerance (4:20); no chest pain; normal BP response (SBP of 81 felt to be inaccurate); no ST changes; no exercise induced arrhythmias; negative inadequate ETT (pt achieved THR of 63% predicted).   She  is here today for pre-operative cardiac evaluation. She is to have right knee surgery for a torn meniscus, by Dr. Theda Sers of [facility]. Date is to be determined.  She has no cardiac complaints. She has had a little upper airway chest congestion causing her to cough, but no significant dyspnea. She is inactive as she has fallen several times due to her knee injury. Once injuring her face and chipping her tooth. She did not have significant bleeding on Xarelto. Follow up head CT did no show active bleeding. ///////////////////////////////////////////////// ASSESSMENT AND PLAN: 1. Pre-Operative Cardiac Clearance:   Chart reviewed as part of pre-operative protocol coverage. She is also examined here in the office.  Given past medical history and examination today, based on ACC/AHA guidelines, Lisa Acosta would be at acceptable risk for the planned procedure without further cardiovascular testing. She will be required to hold Xarelto a minimum of 24 hours prior to surgery. Resume ASAP post procedure.  2. Paraoxysmal Atrial fib: In NSR today, on auscultation some irregular rhythm was noted. She states that she occasionally feels palpitations but no sustained. She is stable concerning HR. EKG is reviewed as above. Continue Xarelto with stipulations as above. Samples are provided as she is now in doughnut hole. Information given to her for drug assistance.  3. Hypertension: BP is well controlled currently. No changes in her regimen at this time.    Lisa Acosta notes that she is feeling depressed- frozen, not able to get things done, she does  not feel motivated to get out and do anything.  She has been under a lot of strain and things are not good at home  There have been a lot of deaths in her family- her mother and her SIL died this year  Her oldest 71 yo grand-son has a lot of health issues- low vision, autism.  They live in the same home- Lisa Acosta and her husband live downstairs in the basement of her  daughter's home.  They really can't afford their own place but make too much to use section 8 assistance.  Their apt is dark, does not have much in the way of windows.  Her daughter and SIL will yell and fight a lot- often with the 68 yo- which is really unpleasant  The 71 yo grand-son also struggles - his life is not normal due to his brother's health and it is hard to see him suffer. Her husband has alcoholism and is actively drinking now   She would like to start volunteering but does not feel like she has the mental strength to get out the door   Her daughter has encouraged her to pursue counseling Her husband is starting to get frustrated with her as well   She is on 80mg  of fluoxetine for the last 1-2 years.   She is also on trazodone 50 mg at bedtime for sleep  She has gotten depressed following her hysterectomy and also following her rotator cuff surgery  She has used zoloft, celexa,effexor in the past  She has not used wellbutrin in the past  No history of seizures finances are a big strain on the family as well Lisa Acosta would like to move to [place] to be near her 2 brothers who she loves very much, but she is afraid that she won't ever see her daughter or grands then  Discussed carefully with Lisa Acosta- she has had thoughts that her family might "not care" if she was dead, but she denies any intention or plans for self harm.  She agrees to seek care if this becomes a problem for her   She needs a GI referral We did refer her to [facility] a few years ago; she had been seen in [place] but does not want to go back there.  [facility] would not see her again as she had care in [place].  Will refer to to see Dr. Adriana Mccallum   Patient Active Problem List   Diagnosis Date Noted  . Osteopenia 01/25/2018  . 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 Dyspnea 09/22/2016  . OSA (obstructive sleep apnea) 08/31/2016  . PAF (paroxysmal atrial fibrillation) ([place]) 03/16/2016  . Hypothyroid 03/16/2016  .  Obesity (BMI 30-39.9) 03/16/2016  . Essential hypertension 11/10/2015  . Hyperlipidemia 11/10/2015    Past Medical History:  Diagnosis Date  . Atrial fibrillation ([place])   . Depression   . Diverticulitis   . Diverticulosis   . Esophageal ulcer   . GERD (gastroesophageal reflux disease)   . Hyperlipidemia   . Hypertension   . Hypothyroid   . OSA (obstructive sleep apnea) 08/31/2016  . Osteopenia     Past Surgical History:  Procedure Laterality Date  . BREAST BIOPSY Left    needle core biopsy, benign  . CATARACT EXTRACTION    . HAND SURGERY    . KNEE SURGERY    . ROTATOR CUFF REPAIR    . TOTAL ABDOMINAL HYSTERECTOMY      Social History   Tobacco Use  .  Smoking status: Never Smoker  . Smokeless tobacco: Never Used  Substance Use Topics  . Alcohol use: Yes    Comment: rare  . Drug use: No    Family History  Problem Relation Age of Onset  . Atrial fibrillation Mother   . Congestive Heart Failure Mother   . Heart disease Father   . Heart disease Brother   . Heart disease Brother     Allergies  Allergen Reactions  . Dextran Anaphylaxis  . Dextrans   . Dextromethorphan Hbr Other (See Comments)    hallucinations  . Tree Extract   . Penicillin G Rash  . Penicillins Rash  . Sulfa Antibiotics Rash  . Sulfacetamide Sodium Rash    Medication list has been reviewed and updated.  Current Outpatient Medications on File Prior to Visit  Medication Sig Dispense Refill  . amLODipine (NORVASC) 5 MG tablet Take 1 tablet (5 mg total) by mouth daily. <PLEASE MAKE APPOINTMENT FOR REFILLS> 30 tablet 0  . atenolol (TENORMIN) 50 MG tablet TAKE 1 TABLET DAILY 90 tablet 1  . BIOTIN PO Take 1 tablet by mouth daily.    . Calcium Carb-Cholecalciferol (CALCIUM 600 + D PO) Take by mouth.    . Calcium Carbonate-Vitamin D (CALTRATE 600+D) 600-400 MG-UNIT tablet Take 1 tablet by mouth 2 (two) times daily. (Patient taking differently: Take 1 tablet by mouth daily. )    .  Cholecalciferol (D 2000) 2000 units TABS Take 1 tablet by mouth daily.    . flecainide (TAMBOCOR) 100 MG tablet Take 1 tablet (100 mg total) by mouth 2 (two) times daily. 180 tablet 3  . FLUoxetine (PROZAC) 40 MG capsule Take 1 capsule (40 mg total) by mouth 2 (two) times daily. 180 capsule 3  . Krill Oil 1000 MG CAPS Take 1 capsule by mouth daily.    [name] lactobacillus acidophilus (BACID) TABS tablet Take 1 tablet by mouth daily.    [name] levothyroxine (SYNTHROID, LEVOTHROID) 125 MCG tablet Take 1 tablet (125 mcg total) by mouth daily before breakfast. 90 tablet 1  . lovastatin (MEVACOR) 40 MG tablet Take 1 tablet (40 mg total) by mouth at bedtime. 90 tablet 3  . LUTEIN PO Take 1 capsule by mouth daily.    . magnesium 30 MG tablet Take 30 mg by mouth daily.    [name] omeprazole (PRILOSEC) 40 MG capsule Take 40 mg by mouth 2 (two) times daily.    . Potassium 75 MG TABS Take 1 tablet by mouth daily.    . traZODone (DESYREL) 50 MG tablet Take 1 tablet (50 mg total) by mouth at bedtime. 90 tablet 3  . Turmeric 450 MG CAPS Take 1 capsule by mouth daily.    [name] 20 MG TABS tablet TAKE 1 TABLET DAILY WITH   SUPPER 90 tablet 3   No current facility-administered medications on file prior to visit.     Review of Systems:  As per HPI- otherwise negative.  Pulse Readings from Last 3 Encounters:  04/15/18 (!) 53  04/10/18 (!) 51  01/28/18 (!) 54   No fever or chills  No CP or SOB   Physical Examination: Vitals:   04/15/18 1040  BP: 126/68  Pulse: (!) 53  Resp: 16  SpO2: 98%   Vitals:   04/15/18 1040  Weight: 195 lb 12.8 oz (88.8 kg)  Height: 5\' 1"  (1.549 m)   Body mass index is 37 kg/m. Ideal Body Weight: Weight in (lb) to have BMI = 25: 132  GEN: WDWN, NAD, Non-toxic, A & O x 3, looks well, overweight,  She is occasionally tearful today in interview  HEENT: Atraumatic, Normocephalic. Neck supple. No masses, No LAD. Ears and Nose: No external deformity. CV: RRR, No M/G/R. No JVD. No  thrill. No extra heart sounds. PULM: CTA B, no wheezes, crackles, rhonchi. No retractions. No resp. distress. No accessory muscle use. ABD: S, NT, ND, +BS. No rebound. No HSM. EXTR: No c/c/e NEURO Normal gait.  PSYCH: Normally interactive. Conversant.  Calm demeanor.    Assessment and Plan: Moderate episode of recurrent major depressive disorder ([place]) - Plan: vortioxetine HBr (TRINTELLIX) 10 MG TABS tablet  Bradycardia  Hypothyroidism due to acquired atrophy of thyroid - Plan: TSH  Pre-diabetes - Plan: CBC, Hemoglobin A1c  Dyslipidemia - Plan: Lipid panel  Medication monitoring encounter - Plan: CBC, Comprehensive metabolic panel  Gastroesophageal reflux disease, esophagitis presence not specified - Plan: Ambulatory referral to Gastroenterology  Discussion of depression today.  Lisa Acosta is struggling and has a lot of things that are sad to her right now.  She is on max dose prozac which is not helping.  We would like to try changing to Trintellix if she can afford it.  Discussed how to taper from prozac and change over.  Also asked her to decrease trazodone to 25 mg for a few days when she changes to trintellix.    See patient instructions for more details.   Did routine labs for her today as well   Signed Lamar Blinks, MD  Received her labs  Results for orders placed or performed in visit on 04/15/18  CBC  Result Value Ref Range   WBC 7.3 4.0 - 10.5 K/uL   RBC 4.57 3.87 - 5.11 Mil/uL   Platelets 275.0 150.0 - 400.0 K/uL   Hemoglobin 13.4 12.0 - 15.0 g/dL   HCT 40.0 36.0 - 46.0 %   MCV 87.6 78.0 - 100.0 fl   MCHC 33.6 30.0 - 36.0 g/dL   RDW 13.4 11.5 - 15.5 %  Comprehensive metabolic panel  Result Value Ref Range   Sodium 141 135 - 145 mEq/L   Potassium 4.6 3.5 - 5.1 mEq/L   Chloride 103 96 - 112 mEq/L   CO2 32 19 - 32 mEq/L   Glucose, Bld 88 70 - 99 mg/dL   BUN 14 6 - 23 mg/dL   Creatinine, Ser 0.71 0.40 - 1.20 mg/dL   Total Bilirubin 0.6 0.2 - 1.2 mg/dL    Alkaline Phosphatase 108 39 - 117 U/L   AST 18 0 - 37 U/L   ALT 18 0 - 35 U/L   Total Protein 6.8 6.0 - 8.3 g/dL   Albumin 4.1 3.5 - 5.2 g/dL   Calcium 9.9 8.4 - 10.5 mg/dL   GFR 87.01 >60.00 mL/min  Lipid panel  Result Value Ref Range   Cholesterol 203 (H) 0 - 200 mg/dL   Triglycerides 192.0 (H) 0.0 - 149.0 mg/dL   HDL 54.50 >39.00 mg/dL   VLDL 38.4 0.0 - 40.0 mg/dL   LDL Cholesterol 110 (H) 0 - 99 mg/dL   Total CHOL/HDL Ratio 4    NonHDL 148.63   TSH  Result Value Ref Range   TSH 1.71 0.35 - 4.50 uIU/mL  Hemoglobin A1c  Result Value Ref Range   Hgb A1c MFr Bld 5.8 4.6 - 6.5 %

## 2018-04-15 ENCOUNTER — Encounter: Payer: Self-pay | Admitting: Family Medicine

## 2018-04-15 ENCOUNTER — Ambulatory Visit (INDEPENDENT_AMBULATORY_CARE_PROVIDER_SITE_OTHER): Payer: Medicare Other | Admitting: Family Medicine

## 2018-04-15 VITALS — BP 126/68 | HR 53 | Resp 16 | Ht 61.0 in | Wt 195.8 lb

## 2018-04-15 DIAGNOSIS — E785 Hyperlipidemia, unspecified: Secondary | ICD-10-CM

## 2018-04-15 DIAGNOSIS — R7303 Prediabetes: Secondary | ICD-10-CM | POA: Diagnosis not present

## 2018-04-15 DIAGNOSIS — K219 Gastro-esophageal reflux disease without esophagitis: Secondary | ICD-10-CM | POA: Diagnosis not present

## 2018-04-15 DIAGNOSIS — E034 Atrophy of thyroid (acquired): Secondary | ICD-10-CM

## 2018-04-15 DIAGNOSIS — F331 Major depressive disorder, recurrent, moderate: Secondary | ICD-10-CM

## 2018-04-15 DIAGNOSIS — R001 Bradycardia, unspecified: Secondary | ICD-10-CM | POA: Diagnosis not present

## 2018-04-15 DIAGNOSIS — Z5181 Encounter for therapeutic drug level monitoring: Secondary | ICD-10-CM | POA: Diagnosis not present

## 2018-04-15 LAB — LIPID PANEL
Cholesterol: 203 mg/dL — ABNORMAL HIGH (ref 0–200)
HDL: 54.5 mg/dL (ref >39.00)
LDL Cholesterol: 110 mg/dL — ABNORMAL HIGH (ref 0–99)
NonHDL: 148.63
Total CHOL/HDL Ratio: 4
Triglycerides: 192 mg/dL — ABNORMAL HIGH (ref 0.0–149.0)
VLDL: 38.4 mg/dL (ref 0.0–40.0)

## 2018-04-15 LAB — COMPREHENSIVE METABOLIC PANEL
ALT: 18 U/L (ref 0–35)
AST: 18 U/L (ref 0–37)
Albumin: 4.1 g/dL (ref 3.5–5.2)
Alkaline Phosphatase: 108 U/L (ref 39–117)
BUN: 14 mg/dL (ref 6–23)
CO2: 32 mEq/L (ref 19–32)
Calcium: 9.9 mg/dL (ref 8.4–10.5)
Chloride: 103 mEq/L (ref 96–112)
Creatinine, Ser: 0.71 mg/dL (ref 0.40–1.20)
GFR: 87.01 mL/min (ref >60.00)
Glucose, Bld: 88 mg/dL (ref 70–99)
Potassium: 4.6 mEq/L (ref 3.5–5.1)
Sodium: 141 mEq/L (ref 135–145)
Total Bilirubin: 0.6 mg/dL (ref 0.2–1.2)
Total Protein: 6.8 g/dL (ref 6.0–8.3)

## 2018-04-15 LAB — CBC
HCT: 40 % (ref 36.0–46.0)
Hemoglobin: 13.4 g/dL (ref 12.0–15.0)
MCHC: 33.6 g/dL (ref 30.0–36.0)
MCV: 87.6 fl (ref 78.0–100.0)
Platelets: 275 10*3/uL (ref 150.0–400.0)
RBC: 4.57 Mil/uL (ref 3.87–5.11)
RDW: 13.4 % (ref 11.5–15.5)
WBC: 7.3 10*3/uL (ref 4.0–10.5)

## 2018-04-15 LAB — HEMOGLOBIN A1C: Hgb A1c MFr Bld: 5.8 % (ref 4.6–6.5)

## 2018-04-15 LAB — TSH: TSH: 1.71 u[IU]/mL (ref 0.35–4.50)

## 2018-04-15 MED ORDER — VORTIOXETINE HBR 10 MG PO TABS: 10.0000 mg | ORAL_TABLET | Freq: Every day | ORAL | 6 refills | Status: DC

## 2018-04-15 NOTE — Patient Instructions (Signed)
It was good to see you today but I am sorry you are having such a hard time right now,  It sounds like your life circumstances have not been at all easy.   Let's try changing from prozac to Trintellix. Visit the pharmacy with your savings card first to see how much it will cost for you. If not affordable message me and we will find an alternative.  If you are able to use the Trintellix, first please decrease your fluoxetine to 40 mg /day for one week, then stop this med, allow 2 days to wash out, and then start Trintellix at 10 mg a day  Exposure to sunlight, time outdoors, and time with friends is also very important!   If you are not safe or feel at risk of self harm please call 911  Please see me in 4 weeks to check on how you are doing  I also gave you some info about counseling services which may be helpful for you today

## 2018-04-16 ENCOUNTER — Encounter: Payer: Self-pay | Admitting: Family Medicine

## 2018-04-19 ENCOUNTER — Encounter: Payer: Self-pay | Admitting: Family Medicine

## 2018-04-19 DIAGNOSIS — I10 Essential (primary) hypertension: Secondary | ICD-10-CM | POA: Diagnosis not present

## 2018-04-19 DIAGNOSIS — Z79899 Other long term (current) drug therapy: Secondary | ICD-10-CM | POA: Diagnosis not present

## 2018-04-19 DIAGNOSIS — I480 Paroxysmal atrial fibrillation: Secondary | ICD-10-CM | POA: Diagnosis not present

## 2018-04-19 DIAGNOSIS — Z713 Dietary counseling and surveillance: Secondary | ICD-10-CM | POA: Diagnosis not present

## 2018-04-19 DIAGNOSIS — Z7901 Long term (current) use of anticoagulants: Secondary | ICD-10-CM | POA: Diagnosis not present

## 2018-04-19 DIAGNOSIS — Z5181 Encounter for therapeutic drug level monitoring: Secondary | ICD-10-CM | POA: Diagnosis not present

## 2018-04-19 DIAGNOSIS — Z8711 Personal history of peptic ulcer disease: Secondary | ICD-10-CM | POA: Diagnosis not present

## 2018-04-22 ENCOUNTER — Encounter: Payer: Self-pay | Admitting: Family Medicine

## 2018-04-22 DIAGNOSIS — F320 Major depressive disorder, single episode, mild: Secondary | ICD-10-CM

## 2018-04-24 MED ORDER — BUPROPION HCL ER (SR) 100 MG PO TB12: 100.0000 mg | ORAL_TABLET | Freq: Two times a day (BID) | ORAL | 6 refills | Status: DC

## 2018-04-26 ENCOUNTER — Telehealth: Payer: Self-pay

## 2018-04-26 DIAGNOSIS — Z79899 Other long term (current) drug therapy: Secondary | ICD-10-CM

## 2018-04-26 NOTE — Telephone Encounter (Signed)
Patient called in, stated that her PCP placed her on a new antidepressant medication, and they suggested to have an EKG done as it may interact with her flecainide medication, and to have the EKG done within a week of starting the new medication (Wellbutrin), she would like to have that done here, okay to place order for an EKG and place her on the Nurse schedule for next week?  Please advise, thank you!

## 2018-04-26 NOTE — Telephone Encounter (Signed)
Pt. Called [facility] to schedule EKG per conversation with Dr. Lorelei Pont. Pt. states she started her wellbutrin 8/15. Order for EKG placed for pending [place] appointment, [facility] to schedule.

## 2018-04-26 NOTE — Addendum Note (Signed)
Addended by: Raynelle Dick R on: 04/26/2018 01:50 PM   Modules accepted: Orders

## 2018-04-27 NOTE — Telephone Encounter (Signed)
Yes Lisa Acosta  

## 2018-04-29 NOTE — Telephone Encounter (Signed)
Called patient, made nurse visit appointment. Order for EKG okay to be placed per Dr.Crenshaw for medication management. Patient also mentions records were faxed from office at [facility] that [facility] should receive.   Patient had no questions or concerns.

## 2018-05-01 ENCOUNTER — Ambulatory Visit (INDEPENDENT_AMBULATORY_CARE_PROVIDER_SITE_OTHER): Payer: Medicare Other | Admitting: Psychology

## 2018-05-01 DIAGNOSIS — F331 Major depressive disorder, recurrent, moderate: Secondary | ICD-10-CM | POA: Diagnosis not present

## 2018-05-02 ENCOUNTER — Ambulatory Visit (INDEPENDENT_AMBULATORY_CARE_PROVIDER_SITE_OTHER): Payer: Medicare Other | Admitting: *Deleted

## 2018-05-02 DIAGNOSIS — I480 Paroxysmal atrial fibrillation: Secondary | ICD-10-CM

## 2018-05-02 NOTE — [place] Notes (Signed)
1.) Reason for visit: EKG  2.) Name of MD requesting visit: [facility]   3.) H&P:  History of atrial fib on flecanide and was recently started on wellbutrin and was asked to have an ECG in 1 week to check the QRS.  4.) ROS related to problem: she does report more flutters since starting the wellbutrin but no atrial fib. No other complaints.  5.) Assessment and plan per MD: ECG shown to dr hochrein and everything was fine.

## 2018-05-11 NOTE — Progress Notes (Signed)
[facility] [place], [place], [place] [address] [phone]  Date:  05/15/2018   Name:  Lisa Acosta   DOB:  [birth year]   MRN:  [id number]  PCP:  Darreld Mclean, MD    Chief Complaint: Depression (4 week follow up)   History of Present Illness:  Lisa Acosta is a 67 y.o. very pleasant female patient who presents with the following:  Short term follow-up today - I saw her about one month ago to discuss significant family and situational pressures and associated depression  We ended up changing her over to wellbutrin  Her recent EKG per cardiology looked ok- 8/22 1. Reason for visit: EKG 2. Name of MD requesting visit: [facility]  3. H&P:  History of atrial fib on flecanide and was recently started on wellbutrin and was asked to have an ECG in 1 week to check the QRS. 4. ROS related to problem: she does report more flutters since starting the wellbutrin but no atrial fib. No other complaints. 5. Assessment and plan per MD: ECG shown to dr hochrein and everything was fine.   Today Infant notes that her depression is perhaps a bit better I did refer her to Dr. Collene Mares a month ago for Gi issues- however she did not get a call as of yet. Gave her phone number for Dr. Lorie Apley office and she will call her   She notes that her hands are shaking a lot- this makes it hard to hold a piece of paper This has been present for a year or more, but getting worse- could be due to wellbutrin She has noted this worsening for about 2 weeks  Also, she notes that she got confused while driving through construction zones recently with lane changes and traffic cones.  She did NOT get in an accident but could have. She can see fine, but she feels like she is having a harder time processing info Otherwise no issues with her memory noted  She has an appt to see Terri tomorrow as well She is taking 40 mg of prozac once a day  still but would like to continue to taper off this if she can   Lisa Acosta is still really stuck living in the basement of her daughter's home.  There is a fair amount of tension in the family.  She and her husband cannot afford to move out. Ideally she would like to move to [place] to be closer to her 2 brothers but does not see how this is possible  She denies any SI She is feeling a bit more motivated and is thinking about auditing some college courses to fill her time  Lab Results  Component Value Date   TSH 1.71 04/15/2018    Patient Active Problem List   Diagnosis Date Noted  . Osteopenia 01/25/2018  . 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 Dyspnea 09/22/2016  . OSA (obstructive sleep apnea) 08/31/2016  . PAF (paroxysmal atrial fibrillation) ([place]) 03/16/2016  . Hypothyroid 03/16/2016  . Obesity (BMI 30-39.9) 03/16/2016  . Essential hypertension 11/10/2015  . Hyperlipidemia 11/10/2015    Past Medical History:  Diagnosis Date  . Atrial fibrillation ([place])   . Depression   . Diverticulitis   . Diverticulosis   . Esophageal ulcer   . GERD (gastroesophageal reflux disease)   . Hyperlipidemia   . Hypertension   . Hypothyroid   . OSA (obstructive sleep apnea) 08/31/2016  .  Osteopenia     Past Surgical History:  Procedure Laterality Date  . BREAST BIOPSY Left    needle core biopsy, benign  . CATARACT EXTRACTION    . HAND SURGERY    . KNEE SURGERY    . ROTATOR CUFF REPAIR    . TOTAL ABDOMINAL HYSTERECTOMY      Social History   Tobacco Use  . Smoking status: Never Smoker  . Smokeless tobacco: Never Used  Substance Use Topics  . Alcohol use: Yes    Comment: rare  . Drug use: No    Family History  Problem Relation Age of Onset  . Atrial fibrillation Mother   . Congestive Heart Failure Mother   . Heart disease Father   . Heart disease Brother   . Heart disease Brother     Allergies  Allergen Reactions  . Dextran Anaphylaxis  . Dextrans   .  Dextromethorphan Hbr Other (See Comments)    hallucinations  . Tree Extract   . Penicillin G Rash  . Penicillins Rash  . Sulfa Antibiotics Rash  . Sulfacetamide Sodium Rash    Medication list has been reviewed and updated.  Current Outpatient Medications on File Prior to Visit  Medication Sig Dispense Refill  . amLODipine (NORVASC) 5 MG tablet Take 1 tablet (5 mg total) by mouth daily. <PLEASE MAKE APPOINTMENT FOR REFILLS> 30 tablet 0  . atenolol (TENORMIN) 50 MG tablet TAKE 1 TABLET DAILY 90 tablet 1  . BIOTIN PO Take 1 tablet by mouth daily.    [name] buPROPion (WELLBUTRIN SR) 100 MG 12 hr tablet Take 1 tablet (100 mg total) by mouth 2 (two) times daily. 60 tablet 6  . Calcium Carb-Cholecalciferol (CALCIUM 600 + D PO) Take by mouth.    . Cholecalciferol (D 2000) 2000 units TABS Take 1 tablet by mouth daily.    . flecainide (TAMBOCOR) 100 MG tablet Take 1 tablet (100 mg total) by mouth 2 (two) times daily. 180 tablet 3  . FLUoxetine (PROZAC) 40 MG capsule Take 1 capsule (40 mg total) by mouth 2 (two) times daily. 180 capsule 3  . Krill Oil 1000 MG CAPS Take 1 capsule by mouth daily.    [name] lactobacillus acidophilus (BACID) TABS tablet Take 1 tablet by mouth daily.    [name] levothyroxine (SYNTHROID, LEVOTHROID) 125 MCG tablet Take 1 tablet (125 mcg total) by mouth daily before breakfast. 90 tablet 1  . lovastatin (MEVACOR) 40 MG tablet Take 1 tablet (40 mg total) by mouth at bedtime. 90 tablet 3  . LUTEIN PO Take 1 capsule by mouth daily.    . magnesium 30 MG tablet Take 30 mg by mouth daily.    [name] omeprazole (PRILOSEC) 40 MG capsule Take 40 mg by mouth 2 (two) times daily.    . Potassium 75 MG TABS Take 1 tablet by mouth daily.    . traZODone (DESYREL) 50 MG tablet Take 1 tablet (50 mg total) by mouth at bedtime. 90 tablet 3  . Turmeric 450 MG CAPS Take 1 capsule by mouth daily.    [name] 20 MG TABS tablet TAKE 1 TABLET DAILY WITH   SUPPER 90 tablet 3   No current facility-administered  medications on file prior to visit.     Review of Systems:  As per HPI- otherwise negative.   Physical Examination: Vitals:   05/15/18 1045  BP: 118/78  Pulse: (!) 58  Resp: 16  Temp: 98.6 F (37 C)  SpO2: 98%   Vitals:  05/15/18 1045  Weight: 197 lb (89.4 kg)  Height: 5\' 1"  (1.549 m)   Body mass index is 37.22 kg/m. Ideal Body Weight: Weight in (lb) to have BMI = 25: 132  GEN: WDWN, NAD, Non-toxic, A & O x 3, obese, looks well  HEENT: Atraumatic, Normocephalic. Neck supple. No masses, No LAD. Ears and Nose: No external deformity. CV: RRR, No M/G/R. No JVD. No thrill. No extra heart sounds. PULM: CTA B, no wheezes, crackles, rhonchi. No retractions. No resp. distress. No accessory muscle use. EXTR: No c/c/e NEURO Normal gait.  PSYCH: Normally interactive. Conversant. Not depressed or anxious appearing.  Calm demeanor.    Assessment and Plan: Depression, major, single episode, mild ([place])  Needs flu shot - Plan: Flu vaccine HIGH DOSE PF (Fluzone High dose)  Following up today With addition of wellbutrin she is feeling somewhat less depressed Although her tremor is a bit worse she would like to continue to take it for now  Suspect her confusion in construction zone was due to stress and traffic pattern change.  She will watch for any other issues with her mental state or any other concerns  She will continue to taper off her prozac Discussed ways she might be able to get out and get a break from the stress of home/   She will see me in 2-3 months   Signed Lamar Blinks, MD

## 2018-05-15 ENCOUNTER — Ambulatory Visit (INDEPENDENT_AMBULATORY_CARE_PROVIDER_SITE_OTHER): Payer: Medicare Other | Admitting: Family Medicine

## 2018-05-15 ENCOUNTER — Encounter: Payer: Self-pay | Admitting: Family Medicine

## 2018-05-15 VITALS — BP 118/78 | HR 58 | Temp 98.6°F | Resp 16 | Ht 61.0 in | Wt 197.0 lb

## 2018-05-15 DIAGNOSIS — Z23 Encounter for immunization: Secondary | ICD-10-CM

## 2018-05-15 DIAGNOSIS — F320 Major depressive disorder, single episode, mild: Secondary | ICD-10-CM

## 2018-05-15 NOTE — Patient Instructions (Signed)
It was good to see you today- I do think you seem to be less depressed However, if you decide you need to come off wellbutrin due to tremor please alert me  Try to work on getting out of the house more so you can have some time of renewal. I think your idea of auditing classes at [employer] is a great one. Other local colleges may also offer this possibility.    Doing some volunteer work may also be helpful for you- you might enjoy doing something though your church.  [facility] may also be a good choice for you  Please decrease your prozac to every other day for about 5 doses, then stop using it  Please let me know if you need anything or if you are not doing ok Otherwise please see me in 2-3 months to check on your progress

## 2018-05-16 ENCOUNTER — Ambulatory Visit (INDEPENDENT_AMBULATORY_CARE_PROVIDER_SITE_OTHER): Payer: Medicare Other | Admitting: Psychology

## 2018-05-16 DIAGNOSIS — F331 Major depressive disorder, recurrent, moderate: Secondary | ICD-10-CM | POA: Diagnosis not present

## 2018-05-17 ENCOUNTER — Other Ambulatory Visit: Payer: Self-pay | Admitting: Family Medicine

## 2018-05-17 DIAGNOSIS — F320 Major depressive disorder, single episode, mild: Secondary | ICD-10-CM

## 2018-05-22 ENCOUNTER — Encounter: Payer: Self-pay | Admitting: Family Medicine

## 2018-06-04 DIAGNOSIS — X58XXXA Exposure to other specified factors, initial encounter: Secondary | ICD-10-CM | POA: Diagnosis not present

## 2018-06-04 DIAGNOSIS — Y999 Unspecified external cause status: Secondary | ICD-10-CM | POA: Diagnosis not present

## 2018-06-04 DIAGNOSIS — S83281A Other tear of lateral meniscus, current injury, right knee, initial encounter: Secondary | ICD-10-CM | POA: Diagnosis not present

## 2018-06-04 DIAGNOSIS — S83231A Complex tear of medial meniscus, current injury, right knee, initial encounter: Secondary | ICD-10-CM | POA: Diagnosis not present

## 2018-06-04 DIAGNOSIS — G8918 Other acute postprocedural pain: Secondary | ICD-10-CM | POA: Diagnosis not present

## 2018-06-04 DIAGNOSIS — M94261 Chondromalacia, right knee: Secondary | ICD-10-CM | POA: Diagnosis not present

## 2018-06-11 DIAGNOSIS — G8918 Other acute postprocedural pain: Secondary | ICD-10-CM | POA: Diagnosis not present

## 2018-06-11 DIAGNOSIS — M25561 Pain in right knee: Secondary | ICD-10-CM | POA: Diagnosis not present

## 2018-06-13 DIAGNOSIS — G8918 Other acute postprocedural pain: Secondary | ICD-10-CM | POA: Diagnosis not present

## 2018-06-13 DIAGNOSIS — M25561 Pain in right knee: Secondary | ICD-10-CM | POA: Diagnosis not present

## 2018-06-18 DIAGNOSIS — Z4789 Encounter for other orthopedic aftercare: Secondary | ICD-10-CM | POA: Diagnosis not present

## 2018-06-20 ENCOUNTER — Telehealth: Payer: Self-pay | Admitting: Cardiology

## 2018-06-20 DIAGNOSIS — I480 Paroxysmal atrial fibrillation: Secondary | ICD-10-CM

## 2018-06-20 MED ORDER — RIVAROXABAN 20 MG PO TABS: ORAL_TABLET | ORAL | 0 refills | Status: DC

## 2018-06-20 NOTE — Telephone Encounter (Signed)
SPOKE TO PATIENT.  SAMPLES ARE AVAILABLE FOR PICK UP.  PATIENT VERBALIZED UNDERSTANDING.

## 2018-06-20 NOTE — Telephone Encounter (Signed)
New Message        Patient calling the office for samples of medication:   1.  What medication and dosage are you requesting samples for? Xarelto 20 mg  2.  Are you currently out of this medication? Patient will be out tomorrow

## 2018-06-21 DIAGNOSIS — M25561 Pain in right knee: Secondary | ICD-10-CM | POA: Diagnosis not present

## 2018-06-21 DIAGNOSIS — G8918 Other acute postprocedural pain: Secondary | ICD-10-CM | POA: Diagnosis not present

## 2018-06-24 DIAGNOSIS — G8918 Other acute postprocedural pain: Secondary | ICD-10-CM | POA: Diagnosis not present

## 2018-06-24 DIAGNOSIS — M25561 Pain in right knee: Secondary | ICD-10-CM | POA: Diagnosis not present

## 2018-06-26 DIAGNOSIS — M25561 Pain in right knee: Secondary | ICD-10-CM | POA: Diagnosis not present

## 2018-06-26 DIAGNOSIS — G8918 Other acute postprocedural pain: Secondary | ICD-10-CM | POA: Diagnosis not present

## 2018-07-02 DIAGNOSIS — Z4789 Encounter for other orthopedic aftercare: Secondary | ICD-10-CM | POA: Diagnosis not present

## 2018-07-04 DIAGNOSIS — Z471 Aftercare following joint replacement surgery: Secondary | ICD-10-CM | POA: Diagnosis not present

## 2018-07-04 DIAGNOSIS — Z96651 Presence of right artificial knee joint: Secondary | ICD-10-CM | POA: Diagnosis not present

## 2018-07-09 DIAGNOSIS — Z4789 Encounter for other orthopedic aftercare: Secondary | ICD-10-CM | POA: Diagnosis not present

## 2018-07-10 ENCOUNTER — Ambulatory Visit (INDEPENDENT_AMBULATORY_CARE_PROVIDER_SITE_OTHER): Payer: Medicare Other | Admitting: Psychology

## 2018-07-10 DIAGNOSIS — F331 Major depressive disorder, recurrent, moderate: Secondary | ICD-10-CM | POA: Diagnosis not present

## 2018-07-11 DIAGNOSIS — Z4789 Encounter for other orthopedic aftercare: Secondary | ICD-10-CM | POA: Diagnosis not present

## 2018-07-15 ENCOUNTER — Other Ambulatory Visit: Payer: Self-pay | Admitting: Family Medicine

## 2018-07-15 DIAGNOSIS — G8918 Other acute postprocedural pain: Secondary | ICD-10-CM | POA: Diagnosis not present

## 2018-07-15 DIAGNOSIS — M25561 Pain in right knee: Secondary | ICD-10-CM | POA: Diagnosis not present

## 2018-07-15 DIAGNOSIS — E034 Atrophy of thyroid (acquired): Secondary | ICD-10-CM

## 2018-07-15 NOTE — Telephone Encounter (Signed)
Lelvothyroxine refill sent to [facility]. Pt is due for f/u with Dr Lorelei Pont in December. Mychart message sent to pt to schedule appt. For further refills.

## 2018-07-17 DIAGNOSIS — H34831 Tributary (branch) retinal vein occlusion, right eye, with macular edema: Secondary | ICD-10-CM | POA: Diagnosis not present

## 2018-07-17 DIAGNOSIS — H3582 Retinal ischemia: Secondary | ICD-10-CM | POA: Diagnosis not present

## 2018-07-17 DIAGNOSIS — H43812 Vitreous degeneration, left eye: Secondary | ICD-10-CM | POA: Diagnosis not present

## 2018-07-17 DIAGNOSIS — H35422 Microcystoid degeneration of retina, left eye: Secondary | ICD-10-CM | POA: Diagnosis not present

## 2018-07-18 ENCOUNTER — Telehealth: Payer: Self-pay | Admitting: *Deleted

## 2018-07-18 DIAGNOSIS — G8918 Other acute postprocedural pain: Secondary | ICD-10-CM | POA: Diagnosis not present

## 2018-07-18 DIAGNOSIS — M25561 Pain in right knee: Secondary | ICD-10-CM | POA: Diagnosis not present

## 2018-07-18 NOTE — Telephone Encounter (Signed)
Received Physician Orders from [facility]; forwarded to provider/SLS 11/07

## 2018-07-22 DIAGNOSIS — G8918 Other acute postprocedural pain: Secondary | ICD-10-CM | POA: Diagnosis not present

## 2018-07-22 DIAGNOSIS — M25561 Pain in right knee: Secondary | ICD-10-CM | POA: Diagnosis not present

## 2018-07-24 ENCOUNTER — Emergency Department (HOSPITAL_BASED_OUTPATIENT_CLINIC_OR_DEPARTMENT_OTHER): Payer: Medicare Other

## 2018-07-24 ENCOUNTER — Other Ambulatory Visit: Payer: Self-pay

## 2018-07-24 ENCOUNTER — Emergency Department (HOSPITAL_BASED_OUTPATIENT_CLINIC_OR_DEPARTMENT_OTHER)
Admission: EM | Admit: 2018-07-24 | Discharge: 2018-07-24 | Disposition: A | Discharge status: Home / Self Care | Payer: Medicare Other | Attending: Emergency Medicine | Admitting: Emergency Medicine

## 2018-07-24 ENCOUNTER — Encounter (HOSPITAL_BASED_OUTPATIENT_CLINIC_OR_DEPARTMENT_OTHER): Payer: Self-pay | Admitting: *Deleted

## 2018-07-24 DIAGNOSIS — M79645 Pain in left finger(s): Secondary | ICD-10-CM | POA: Diagnosis not present

## 2018-07-24 DIAGNOSIS — S0990XA Unspecified injury of head, initial encounter: Secondary | ICD-10-CM

## 2018-07-24 DIAGNOSIS — Z79899 Other long term (current) drug therapy: Secondary | ICD-10-CM | POA: Diagnosis not present

## 2018-07-24 DIAGNOSIS — Y9301 Activity, walking, marching and hiking: Secondary | ICD-10-CM | POA: Diagnosis not present

## 2018-07-24 DIAGNOSIS — S63642A Sprain of metacarpophalangeal joint of left thumb, initial encounter: Secondary | ICD-10-CM | POA: Diagnosis not present

## 2018-07-24 DIAGNOSIS — Y9289 Other specified places as the place of occurrence of the external cause: Secondary | ICD-10-CM | POA: Diagnosis not present

## 2018-07-24 DIAGNOSIS — W19XXXA Unspecified fall, initial encounter: Secondary | ICD-10-CM

## 2018-07-24 DIAGNOSIS — W010XXA Fall on same level from slipping, tripping and stumbling without subsequent striking against object, initial encounter: Secondary | ICD-10-CM | POA: Insufficient documentation

## 2018-07-24 DIAGNOSIS — S098XXA Other specified injuries of head, initial encounter: Secondary | ICD-10-CM | POA: Diagnosis not present

## 2018-07-24 DIAGNOSIS — Y998 Other external cause status: Secondary | ICD-10-CM | POA: Insufficient documentation

## 2018-07-24 DIAGNOSIS — I10 Essential (primary) hypertension: Secondary | ICD-10-CM | POA: Insufficient documentation

## 2018-07-24 DIAGNOSIS — I4891 Unspecified atrial fibrillation: Secondary | ICD-10-CM | POA: Insufficient documentation

## 2018-07-24 DIAGNOSIS — S60012A Contusion of left thumb without damage to nail, initial encounter: Secondary | ICD-10-CM | POA: Diagnosis not present

## 2018-07-24 DIAGNOSIS — E785 Hyperlipidemia, unspecified: Secondary | ICD-10-CM | POA: Insufficient documentation

## 2018-07-24 DIAGNOSIS — G44309 Post-traumatic headache, unspecified, not intractable: Secondary | ICD-10-CM | POA: Diagnosis not present

## 2018-07-24 NOTE — Discharge Instructions (Addendum)
Follow with Dr. Stanford Breed since you have been having more frequent falls and are on anticoagulation.  Wear the thumb splint for comfort.  If does not improve follow-up with your primary care doctor for further evaluation.

## 2018-07-24 NOTE — ED Triage Notes (Addendum)
Pt c/o fall hitting posterior head in tile x 1 hr ago, NO LOC , pt is on xerelto . Pt also c/o left wrist pain

## 2018-07-24 NOTE — ED Notes (Signed)
Patient transported to CT 

## 2018-07-24 NOTE — ED Provider Notes (Signed)
[facility] EMERGENCY DEPARTMENT Provider Note   CSN: [id number] Arrival date & time: 07/24/18  1357     History   Chief Complaint Chief Complaint  Patient presents with  . Fall    HPI Lisa Acosta is a 67 y.o. female.  HPI Patient presents after a fall.  States she has been unsteady for a while now.  States that she was at the store lost her balance and fell backwards striking the back of her head.  No loss of consciousness.  She is however on Xarelto for atrial fibrillation.  Complaining of pain in the back of her head and her left thumb.  No neck pain.  No loss conscious.  No chest or abdominal pain.  No other extremity pain.  No confusion. Past Medical History:  Diagnosis Date  . Atrial fibrillation ([place])   . Depression   . Diverticulitis   . Diverticulosis   . Esophageal ulcer   . GERD (gastroesophageal reflux disease)   . Hyperlipidemia   . Hypertension   . Hypothyroid   . OSA (obstructive sleep apnea) 08/31/2016  . Osteopenia     Patient Active Problem List   Diagnosis Date Noted  . Osteopenia 01/25/2018  . 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 Dyspnea 09/22/2016  . OSA (obstructive sleep apnea) 08/31/2016  . PAF (paroxysmal atrial fibrillation) ([place]) 03/16/2016  . Hypothyroid 03/16/2016  . Obesity (BMI 30-39.9) 03/16/2016  . Essential hypertension 11/10/2015  . Hyperlipidemia 11/10/2015    Past Surgical History:  Procedure Laterality Date  . BREAST BIOPSY Left    needle core biopsy, benign  . CATARACT EXTRACTION    . HAND SURGERY    . KNEE SURGERY    . ROTATOR CUFF REPAIR    . TOTAL ABDOMINAL HYSTERECTOMY       OB History   None      Home Medications    Prior to Admission medications   Medication Sig Start Date End Date Taking? Authorizing Provider  amLODipine (NORVASC) 5 MG tablet Take 1 tablet (5 mg total) by mouth daily. <PLEASE MAKE APPOINTMENT FOR REFILLS> 10/30/17   Lelon Perla, MD  atenolol (TENORMIN)  50 MG tablet TAKE 1 TABLET DAILY 02/13/18   Copland, Gay Filler, MD  BIOTIN PO Take 1 tablet by mouth daily.    [provider]  buPROPion (WELLBUTRIN SR) 100 MG 12 hr tablet TAKE 1 TABLET BY MOUTH TWICE A DAY 05/17/18   Copland, Gay Filler, MD  Calcium Carb-Cholecalciferol (CALCIUM 600 + D PO) Take by mouth.    [provider]  Cholecalciferol (D 2000) 2000 units TABS Take 1 tablet by mouth daily.    [provider]  flecainide (TAMBOCOR) 100 MG tablet Take 1 tablet (100 mg total) by mouth 2 (two) times daily. 10/29/17 10/29/18  Copland, Gay Filler, MD  FLUoxetine (PROZAC) 40 MG capsule Take 1 capsule (40 mg total) by mouth 2 (two) times daily. 12/10/17   Copland, Gay Filler, MD  Krill Oil 1000 MG CAPS Take 1 capsule by mouth daily.    [provider]  lactobacillus acidophilus (BACID) TABS tablet Take 1 tablet by mouth daily.    [provider]  levothyroxine (SYNTHROID, LEVOTHROID) 125 MCG tablet TAKE 1 TABLET DAILY BEFORE BREAKFAST 07/15/18   Copland, Gay Filler, MD  lovastatin (MEVACOR) 40 MG tablet Take 1 tablet (40 mg total) by mouth at bedtime. 04/02/18   Copland, Gay Filler, MD  LUTEIN PO Take 1 capsule by mouth daily.  [provider]  magnesium 30 MG tablet Take 30 mg by mouth daily.    [provider]  omeprazole (PRILOSEC) 40 MG capsule Take 40 mg by mouth 2 (two) times daily.    [provider]  Potassium 75 MG TABS Take 1 tablet by mouth daily.    [provider]  rivaroxaban (XARELTO) 20 MG TABS tablet TAKE 1 TABLET DAILY WITH   SUPPER 06/20/18   Lelon Perla, MD  traZODone (DESYREL) 50 MG tablet Take 1 tablet (50 mg total) by mouth at bedtime. 01/22/17   Copland, Gay Filler, MD  Turmeric 450 MG CAPS Take 1 capsule by mouth daily.    [provider]    Family History Family History  Problem Relation Age of Onset  . Atrial fibrillation Mother   . Congestive Heart Failure Mother   . Heart disease  Father   . Heart disease Brother   . Heart disease Brother     Social History Social History   Tobacco Use  . Smoking status: Never Smoker  . Smokeless tobacco: Never Used  Substance Use Topics  . Alcohol use: Yes    Comment: rare  . Drug use: No     Allergies   Dextran; Dextrans; Dextromethorphan hbr; Tree extract; Penicillin g; Penicillins; Sulfa antibiotics; and Sulfacetamide sodium   Review of Systems Review of Systems  Constitutional: Negative for appetite change.  HENT: Negative for congestion.   Respiratory: Negative for shortness of breath.   Cardiovascular: Negative for chest pain.  Gastrointestinal: Negative for abdominal distention.  Genitourinary: Negative for flank pain.  Musculoskeletal: Negative for back pain.  Skin: Negative for rash.  Neurological: Positive for headaches.  Hematological: Negative for adenopathy.  Psychiatric/Behavioral: Negative for confusion.     Physical Exam Updated Vital Signs BP (!) 159/70   Pulse (!) 57   Temp 97.6 F (36.4 C) (Oral)   Resp 18   Ht 5\' 1"  (1.549 m)   Wt 86.2 kg   SpO2 97%   BMI 35.90 kg/m   Physical Exam  Constitutional: She appears well-developed.  HENT:  Head: Normocephalic.  Mild tenderness to occipital area.  Eyes: EOM are normal.  Neck: Neck supple.  Cardiovascular: Normal rate.  Pulmonary/Chest: She has no wheezes.  Abdominal: There is no tenderness.  Musculoskeletal: She exhibits tenderness.  Tenderness over left first metacarpal.  No deformity.  No snuffbox tenderness no other wrist elbow or shoulder tenderness.  Movement intact but does have some pain with adduction of thumb.  Skin: Skin is warm. Capillary refill takes less than 2 seconds.     ED Treatments / Results  Labs (all labs ordered are listed, but only abnormal results are displayed) Labs Reviewed - No data to display  EKG None  Radiology Ct Head Wo Contrast  Result Date: 07/24/2018 CLINICAL DATA:  Posttraumatic  headache after fall. EXAM: CT HEAD WITHOUT CONTRAST TECHNIQUE: Contiguous axial images were obtained from the base of the skull through the vertex without intravenous contrast. COMPARISON:  CT scan of Jan 22, 2018. FINDINGS: Brain: Mild diffuse cortical atrophy is noted. No mass effect or midline shift is noted. Ventricular size is within normal limits. There is no evidence of mass lesion, hemorrhage or acute infarction. Vascular: No hyperdense vessel or unexpected calcification. Skull: Normal. Negative for fracture or focal lesion. Sinuses/Orbits: No acute finding. Other: None. IMPRESSION: Mild diffuse cortical atrophy. No acute intracranial abnormality seen. Electronically Signed   By: Marijo Conception, M.D.  On: 07/24/2018 14:37   Dg Finger Thumb Left  Result Date: 07/24/2018 CLINICAL DATA:  Left thumb pain after fall today. Pain and bruising at the first MCP joint. EXAM: LEFT THUMB 2+V COMPARISON:  None. FINDINGS: Osteoarthritis of the left thumb involving the interphalangeal, first MCP and first CMC joints. No acute displaced fracture or joint dislocation. Soft tissue calcification along the extensor aspect of the interphalangeal joint may represent capsular tendon calcification and may be degenerative in etiology or related to old remote trauma. 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 Electronically Signed   By: Ashley Royalty M.D.   On: 07/24/2018 15:40    Procedures Procedures (including critical care time)  Medications Ordered in ED Medications - No data to display   Initial Impression / Assessment and Plan / ED Course  I have reviewed the triage vital signs and the nursing notes.  Pertinent labs & imaging results that were available during my care of the patient were reviewed by me and considered in my medical decision making (see chart for details).     Patient with fall.  Hit head and left thumb.  Head CT reassuring but is on anticoagulation.   Left thumb shows chronic changes but nothing acute.  Will splint thumb for comfort.  Outpatient follow-up.  Patient does have a history of A. fib and is not on anticoagulation but states she has been having more frequent falls.  Patient needs to have a risk-benefit discussion for the Xarelto with her cardiologist.  Final Clinical Impressions(s) / ED Diagnoses   Final diagnoses:  Fall, initial encounter  Minor head injury, initial encounter  Sprain of metacarpophalangeal (MCP) joint of left thumb, initial encounter    ED Discharge Orders    None       Davonna Belling, MD 07/24/18 1617

## 2018-07-29 ENCOUNTER — Ambulatory Visit (INDEPENDENT_AMBULATORY_CARE_PROVIDER_SITE_OTHER): Payer: Medicare Other | Admitting: Psychology

## 2018-07-29 DIAGNOSIS — F331 Major depressive disorder, recurrent, moderate: Secondary | ICD-10-CM | POA: Diagnosis not present

## 2018-07-30 NOTE — Progress Notes (Signed)
[place] at [facility] [address] [place], [place] [address] [phone]  Date:  08/01/2018   Name:  Lisa Acosta   DOB:  1951/08/02   MRN:  [id number]  PCP:  Darreld Mclean, MD    Chief Complaint: ER follow up (hit head, hand sprain, fell backwards, normal head scan and sprain to left hand)   History of Present Illness:  Lisa Acosta is a 67 y.o. very pleasant female patient who presents with the following:  History of A fib on xarelto, OSA, obesity, HTN, hyperlipidemia Following up from recent visit to the ER today She was seen following a fall on 11/13: Patient with fall.  Hit head and left thumb.  Head CT reassuring but is on anticoagulation.  Left thumb shows chronic changes but nothing acute.  Will splint thumb for comfort.  Outpatient follow-up.  Patient does have a history of A. fib and is not on anticoagulation but states she has been having more frequent falls.  Patient needs to have a risk-benefit discussion for the Xarelto with her cardiologist.  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  She will watch for any other issues with her mental state or any other concerns  She will continue to taper off her prozac Discussed ways she might be able to get out and get a break from the stress of home/   She will see me in 2-3 months   She reports that she was out shopping, was starting to feel pretty tired.  She had bent over to get something off the floor and was not using her cane as her arms were full-she stood back up and lost her balance and went over backwards. She hit her head on the tile floor, tried to catch herself with her hand and sprained her left thumb X-rays and CT reassuring  as above  She is wearing her thumb spica splint and thumb is about 40% better She is still feeling a bit tired and nervous about falling again.  We discussed likely concussion as she slept a lot in the few days following her fall.  However this is getting better.  She does not have any escalating symptoms to suggest an occult bleed  She is doing PT and working on her balance.  She had 2 falls in April but this is the first fall since then   Need to discuss her CPAP use today as she may need new supplies soon-  She gets [facility] She has been on CPAP for 15- 20 years Got her current machine 18 months ago  She is using this every night She does not use for naps Uses an average of 7 hours a night  She does not transmit any data to the company She thinks that this is helping her to sleep better- she notes that it improves her quality of life  Ct Head Wo Contrast  Result Date: 07/24/2018 CLINICAL DATA:  Posttraumatic headache after fall. EXAM: CT HEAD WITHOUT CONTRAST TECHNIQUE: Contiguous axial images were obtained from the base of the skull through the vertex without intravenous contrast. COMPARISON:  CT scan of Jan 22, 2018. FINDINGS: Brain: Mild diffuse cortical atrophy is  noted. No mass effect or midline shift is noted. Ventricular size is within normal limits. There is no evidence of mass lesion, hemorrhage or acute infarction. Vascular: No hyperdense vessel or unexpected calcification. Skull: Normal. Negative for fracture or focal lesion. Sinuses/Orbits: No acute finding. Other: None. IMPRESSION: Mild diffuse cortical atrophy. No acute intracranial abnormality seen. Electronically Signed   By: Marijo Conception, M.D.   On: 07/24/2018 14:37   Dg Finger Thumb Left  Result Date: 07/24/2018 CLINICAL DATA:  Left thumb pain after fall today. Pain and bruising at the first MCP joint. EXAM: LEFT THUMB 2+V COMPARISON:  None. FINDINGS: Osteoarthritis of the left thumb involving the  interphalangeal, first MCP and first CMC joints. No acute displaced fracture or joint dislocation. Soft tissue calcification along the extensor aspect of the interphalangeal joint may represent capsular tendon calcification and may be degenerative in etiology or related to old remote trauma. 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 Electronically Signed   By: Ashley Royalty M.D.   On: 07/24/2018 15:40    Patient Active Problem List   Diagnosis Date Noted  . Osteopenia 01/25/2018  . 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 Dyspnea 09/22/2016  . OSA (obstructive sleep apnea) 08/31/2016  . PAF (paroxysmal atrial fibrillation) ([place]) 03/16/2016  . Hypothyroid 03/16/2016  . Obesity (BMI 30-39.9) 03/16/2016  . Essential hypertension 11/10/2015  . Hyperlipidemia 11/10/2015    Past Medical History:  Diagnosis Date  . Atrial fibrillation ([place])   . Depression   . Diverticulitis   . Diverticulosis   . Esophageal ulcer   . GERD (gastroesophageal reflux disease)   . Hyperlipidemia   . Hypertension   . Hypothyroid   . OSA (obstructive sleep apnea) 08/31/2016  . Osteopenia     Past Surgical History:  Procedure Laterality Date  . BREAST BIOPSY Left    needle core biopsy, benign  . CATARACT EXTRACTION    . HAND SURGERY    . KNEE SURGERY    . ROTATOR CUFF REPAIR    . TOTAL ABDOMINAL HYSTERECTOMY      Social History   Tobacco Use  . Smoking status: Never Smoker  . Smokeless tobacco: Never Used  Substance Use Topics  . Alcohol use: Yes    Comment: rare  . Drug use: No    Family History  Problem Relation Age of Onset  . Atrial fibrillation Mother   . Congestive Heart Failure Mother   . Heart disease Father   . Heart disease Brother   . Heart disease Brother     Allergies  Allergen Reactions  . Dextran Anaphylaxis  . Dextrans   . Dextromethorphan Hbr Other (See Comments)    hallucinations  . Tree Extract   . Penicillin G Rash   . Penicillins Rash  . Sulfa Antibiotics Rash  . Sulfacetamide Sodium Rash    Medication list has been reviewed and updated.  Current Outpatient Medications on File Prior to Visit  Medication Sig Dispense Refill  . amLODipine (NORVASC) 5 MG tablet Take 1 tablet (5 mg total) by mouth daily. <PLEASE MAKE APPOINTMENT FOR REFILLS> 30 tablet 0  . atenolol (TENORMIN) 50 MG tablet TAKE 1 TABLET DAILY 90 tablet 1  . BIOTIN PO Take 1 tablet by mouth daily.    . Calcium Carb-Cholecalciferol (CALCIUM 600 + D PO) Take by mouth.    . Cholecalciferol (D 2000) 2000 units TABS Take 1 tablet by mouth daily.    . flecainide (TAMBOCOR) 100  MG tablet Take 1 tablet (100 mg total) by mouth 2 (two) times daily. 180 tablet 3  . FLUoxetine (PROZAC) 40 MG capsule Take 1 capsule (40 mg total) by mouth 2 (two) times daily. 180 capsule 3  . Krill Oil 1000 MG CAPS Take 1 capsule by mouth daily.    [name] lactobacillus acidophilus (BACID) TABS tablet Take 1 tablet by mouth daily.    [name] levothyroxine (SYNTHROID, LEVOTHROID) 125 MCG tablet TAKE 1 TABLET DAILY BEFORE BREAKFAST 90 tablet 0  . lovastatin (MEVACOR) 40 MG tablet Take 1 tablet (40 mg total) by mouth at bedtime. 90 tablet 3  . LUTEIN PO Take 1 capsule by mouth daily.    . magnesium 30 MG tablet Take 30 mg by mouth daily.    [name] omeprazole (PRILOSEC) 40 MG capsule Take 40 mg by mouth 2 (two) times daily.    . Potassium 75 MG TABS Take 1 tablet by mouth daily.    . rivaroxaban (XARELTO) 20 MG TABS tablet TAKE 1 TABLET DAILY WITH   SUPPER 21 tablet 0  . traZODone (DESYREL) 50 MG tablet Take 1 tablet (50 mg total) by mouth at bedtime. 90 tablet 3  . Turmeric 450 MG CAPS Take 1 capsule by mouth daily.     No current facility-administered medications on file prior to visit.     Review of Systems:  As per HPI- otherwise negative. No fever or chills   Physical Examination: Vitals:   08/01/18 1102  BP: 122/60  Pulse: (!) 56  Resp: 16  Temp: 98.4 F (36.9 C)   SpO2: 98%   Vitals:   08/01/18 1102  Weight: 194 lb 9.6 oz (88.3 kg)  Height: 5\' 1"  (1.549 m)   Body mass index is 36.77 kg/m. Ideal Body Weight: Weight in (lb) to have BMI = 25: 132  GEN: WDWN, NAD, Non-toxic, A & O x 3, obese, looks well HEENT: Atraumatic, Normocephalic. Neck supple. No masses, No LAD. Ears and Nose: No external deformity. CV: RRR, No M/G/R. No JVD. No thrill. No extra heart sounds. PULM: CTA B, no wheezes, crackles, rhonchi. No retractions. No resp. distress. No accessory muscle use. ABD: S, NT, ND EXTR: No c/c/e NEURO Normal gait for pt- she is slow for age  PSYCH: Normally interactive. Conversant. Not depressed or anxious appearing.  Calm demeanor. Wearing splint on left wrist- spica She is tender at the 1st MCP joint. Grip strength is reduced.  Hand does show normal ROM and ligaments seem intact Uses cane- normally holds in the left hand but cannot currently     Assessment and Plan: Hospital discharge follow-up  Fall, initial encounter  Contusion of scalp, subsequent encounter  Sprain of metacarpophalangeal (MCP) joint of left thumb, subsequent encounter  Following up today from ER visit Likely mild concussion but she is getting better Thumb is also improving She will rest more as needed and let me know if her thumb is not continuing to progress Also spoke with reading radiologist about her head CT- normal mild changes for age- I called pt and let her know    Signed Lamar Blinks, MD  Called the radiologist who read her CT head- he confirms expected changes for age,nothing unexpected

## 2018-08-01 ENCOUNTER — Ambulatory Visit (INDEPENDENT_AMBULATORY_CARE_PROVIDER_SITE_OTHER): Payer: Medicare Other | Admitting: Family Medicine

## 2018-08-01 ENCOUNTER — Encounter: Payer: Self-pay | Admitting: Family Medicine

## 2018-08-01 VITALS — BP 122/60 | HR 56 | Temp 98.4°F | Resp 16 | Ht 61.0 in | Wt 194.6 lb

## 2018-08-01 DIAGNOSIS — S0003XD Contusion of scalp, subsequent encounter: Secondary | ICD-10-CM | POA: Diagnosis not present

## 2018-08-01 DIAGNOSIS — Z09 Encounter for follow-up examination after completed treatment for conditions other than malignant neoplasm: Secondary | ICD-10-CM

## 2018-08-01 DIAGNOSIS — W19XXXA Unspecified fall, initial encounter: Secondary | ICD-10-CM | POA: Diagnosis not present

## 2018-08-01 DIAGNOSIS — S63642D Sprain of metacarpophalangeal joint of left thumb, subsequent encounter: Secondary | ICD-10-CM | POA: Diagnosis not present

## 2018-08-01 NOTE — Patient Instructions (Addendum)
It was good to see you again today but I am sorry that you fell Continue doing your PT and try not to let this scare you out of your activities If your thumb is not continuing to make good progress please let me know  Please alert me if [facility] needs me to do anything as far as your CPAP machine  I am waiting to discuss your head CT with the radiologist and will let you know as soon as I hear from them

## 2018-08-04 ENCOUNTER — Other Ambulatory Visit: Payer: Self-pay | Admitting: Family Medicine

## 2018-08-04 DIAGNOSIS — R002 Palpitations: Secondary | ICD-10-CM

## 2018-08-05 DIAGNOSIS — M25562 Pain in left knee: Secondary | ICD-10-CM | POA: Diagnosis not present

## 2018-08-05 DIAGNOSIS — M1711 Unilateral primary osteoarthritis, right knee: Secondary | ICD-10-CM | POA: Diagnosis not present

## 2018-08-05 DIAGNOSIS — G8918 Other acute postprocedural pain: Secondary | ICD-10-CM | POA: Diagnosis not present

## 2018-08-05 DIAGNOSIS — M25561 Pain in right knee: Secondary | ICD-10-CM | POA: Diagnosis not present

## 2018-08-05 DIAGNOSIS — Z4789 Encounter for other orthopedic aftercare: Secondary | ICD-10-CM | POA: Diagnosis not present

## 2018-08-12 ENCOUNTER — Encounter: Payer: Self-pay | Admitting: Family Medicine

## 2018-08-12 ENCOUNTER — Other Ambulatory Visit: Payer: Self-pay | Admitting: Cardiology

## 2018-08-12 DIAGNOSIS — F320 Major depressive disorder, single episode, mild: Secondary | ICD-10-CM

## 2018-08-12 DIAGNOSIS — I480 Paroxysmal atrial fibrillation: Secondary | ICD-10-CM

## 2018-08-14 DIAGNOSIS — F339 Major depressive disorder, recurrent, unspecified: Secondary | ICD-10-CM | POA: Diagnosis not present

## 2018-08-14 DIAGNOSIS — Z9181 History of falling: Secondary | ICD-10-CM | POA: Diagnosis not present

## 2018-08-14 DIAGNOSIS — M25561 Pain in right knee: Secondary | ICD-10-CM | POA: Diagnosis not present

## 2018-08-20 DIAGNOSIS — Z4789 Encounter for other orthopedic aftercare: Secondary | ICD-10-CM | POA: Diagnosis not present

## 2018-08-20 NOTE — Progress Notes (Signed)
[place] at [facility] [address] [place], [place] [address] [phone]  Date:  08/21/2018   Name:  Lisa Acosta   DOB:  1951/05/18   MRN:  [id number]  PCP:  Darreld Mclean, MD    Chief Complaint: Skin Irritation (skin irritation of right knee, physical therapy said it needed to be looked at, hx of mrsa)   History of Present Illness:  Lisa Acosta is a 67 y.o. very pleasant female patient who presents with the following:  History of sleep apnea, atrial fib, hypothyroidism, hypertension, hyperlipidemia, obesity. She takes flecainide and Xarelto I saw her last month after a visit to the ER from a fall.  Here today with concern of irritation of her right knee scar from her knee scope in late September.  Her PT suggested that she have this looked at as it did not seem to be going away  She noted some red dots around the site of her scope ports No painful but itchy No increased pain with ROM No fever noted  She has been putting hydrocortisone cream on the knee.  However, the rash is not going away.  She notes that she is still having falls, and that her PT has commented on her poor balance  We had thought her frequent falls might be due to her knee being unstable, but this has not gotten better since her operation Falls started in the spring of this year She is using her cane most of the time -this helps her avoid falls, but is cumbersome because she cannot carry a bag in her cane hand. Her orthopedist also suggested that she see a neurologist due to her balance issues. She asked if she might have a handicap parking placard.  This is certainly fine.  Patient Active Problem List   Diagnosis Date Noted  . Osteopenia 01/25/2018  . 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 Dyspnea 09/22/2016  . OSA (obstructive sleep apnea) 08/31/2016  . PAF (paroxysmal atrial fibrillation) ([place]) 03/16/2016  . Hypothyroid  03/16/2016  . Obesity (BMI 30-39.9) 03/16/2016  . Essential hypertension 11/10/2015  . Hyperlipidemia 11/10/2015    Past Medical History:  Diagnosis Date  . Atrial fibrillation ([place])   . Depression   . Diverticulitis   . Diverticulosis   . Esophageal ulcer   . GERD (gastroesophageal reflux disease)   . Hyperlipidemia   . Hypertension   . Hypothyroid   . OSA (obstructive sleep apnea) 08/31/2016  . Osteopenia     Past Surgical History:  Procedure Laterality Date  . BREAST BIOPSY Left    needle core biopsy, benign  . CATARACT EXTRACTION    . HAND SURGERY    . KNEE SURGERY    . ROTATOR CUFF REPAIR    . TOTAL ABDOMINAL HYSTERECTOMY      Social History   Tobacco Use  . Smoking status: Never Smoker  . Smokeless tobacco: Never Used  Substance Use Topics  . Alcohol use: Yes    Comment: rare  . Drug use: No    Family History  Problem Relation Age of Onset  . Atrial fibrillation Mother   . Congestive Heart Failure Mother   . Heart disease Father   . Heart disease Brother   . Heart disease Brother     Allergies  Allergen Reactions  . Dextran Anaphylaxis  . Dextrans   . Dextromethorphan Hbr Other (See Comments)    hallucinations  . Tree  Extract   . Penicillin G Rash  . Penicillins Rash  . Sulfa Antibiotics Rash  . Sulfacetamide Sodium Rash    Medication list has been reviewed and updated.  Current Outpatient Medications on File Prior to Visit  Medication Sig Dispense Refill  . amLODipine (NORVASC) 5 MG tablet Take 1 tablet (5 mg total) by mouth daily. <PLEASE MAKE APPOINTMENT FOR REFILLS> 30 tablet 0  . atenolol (TENORMIN) 50 MG tablet TAKE 1 TABLET DAILY 90 tablet 1  . BIOTIN PO Take 1 tablet by mouth daily.    . Calcium Carb-Cholecalciferol (CALCIUM 600 + D PO) Take by mouth.    . Cholecalciferol (D 2000) 2000 units TABS Take 1 tablet by mouth daily.    . flecainide (TAMBOCOR) 100 MG tablet Take 1 tablet (100 mg total) by mouth 2 (two) times daily. 180  tablet 3  . FLUoxetine (PROZAC) 40 MG capsule Take 1 capsule (40 mg total) by mouth 2 (two) times daily. 180 capsule 3  . Krill Oil 1000 MG CAPS Take 1 capsule by mouth daily.    [name] lactobacillus acidophilus (BACID) TABS tablet Take 1 tablet by mouth daily.    [name] levothyroxine (SYNTHROID, LEVOTHROID) 125 MCG tablet TAKE 1 TABLET DAILY BEFORE BREAKFAST 90 tablet 0  . lovastatin (MEVACOR) 40 MG tablet Take 1 tablet (40 mg total) by mouth at bedtime. 90 tablet 3  . LUTEIN PO Take 1 capsule by mouth daily.    . magnesium 30 MG tablet Take 30 mg by mouth daily.    [name] omeprazole (PRILOSEC) 40 MG capsule Take 40 mg by mouth 2 (two) times daily.    . Potassium 75 MG TABS Take 1 tablet by mouth daily.    . rivaroxaban (XARELTO) 20 MG TABS tablet TAKE 1 TABLET DAILY WITH   SUPPER 21 tablet 0  . traZODone (DESYREL) 50 MG tablet Take 1 tablet (50 mg total) by mouth at bedtime. 90 tablet 3  . Turmeric 450 MG CAPS Take 1 capsule by mouth daily.     No current facility-administered medications on file prior to visit.     Review of Systems:  As per HPI- otherwise negative.   Physical Examination: Vitals:   08/21/18 1159  BP: 124/70  Pulse: (!) 56  Resp: 16  SpO2: 94%   Vitals:   08/21/18 1159  Weight: 193 lb (87.5 kg)  Height: 5\' 1"  (1.549 m)   Body mass index is 36.47 kg/m. Ideal Body Weight: Weight in (lb) to have BMI = 25: 132  GEN: WDWN, NAD, Non-toxic, A & O x 3.  Obese but has lost weight. HEENT: Atraumatic, Normocephalic. Neck supple. No masses, No LAD. Ears and Nose: No external deformity. CV: RRR, No M/G/R. No JVD. No thrill. No extra heart sounds. PULM: CTA B, no wheezes, crackles, rhonchi. No retractions. No resp. distress. No accessory muscle use. EXTR: No c/c/e NEURO Normal gait.  Right knee shows normal range of motion, no swelling, heat, or effusion.  She does display a dime sized skin finding which happens to lie over 1 of her surgical port incisions.  Is a slightly raised  and scaly, and appears most consistent with a ringworm lesion. PSYCH: Normally interactive. Conversant. Not depressed or anxious appearing.  Calm demeanor.  Carrying cane in right hand as normal for her.  Her Romberg testing is relatively normal.  However, she is not able to stand on 1 foot for more than a second or 2.  In addition, her tandem stance  is markedly abnormal.  Assessment and Plan: Poor balance  Tinea corporis   Here today with concern of skin finding over her right knee.  Her physical therapist was worried it might indicate some sort of complication from her knee surgery and asked her to come in.  However, upon exam this appears to be a superficial skin fungus or ringworm.  Have advised patient how to treat this using topical antifungal.  She is to let me know if not getting better in the next week or 2, certainly sooner if worse. Also, Lisa Acosta has complained of very poor balance.  Indeed on testing her balance is abnormal.  Provided handicap placard as he request.  We will also refer to neurology for evaluation of her balance. Signed Lamar Blinks, MD

## 2018-08-21 ENCOUNTER — Ambulatory Visit (INDEPENDENT_AMBULATORY_CARE_PROVIDER_SITE_OTHER): Payer: Medicare Other | Admitting: Family Medicine

## 2018-08-21 ENCOUNTER — Encounter: Payer: Self-pay | Admitting: Family Medicine

## 2018-08-21 VITALS — BP 124/70 | HR 56 | Resp 16 | Ht 61.0 in | Wt 193.0 lb

## 2018-08-21 DIAGNOSIS — H34831 Tributary (branch) retinal vein occlusion, right eye, with macular edema: Secondary | ICD-10-CM | POA: Diagnosis not present

## 2018-08-21 DIAGNOSIS — B354 Tinea corporis: Secondary | ICD-10-CM | POA: Diagnosis not present

## 2018-08-21 DIAGNOSIS — R2689 Other abnormalities of gait and mobility: Secondary | ICD-10-CM

## 2018-08-21 NOTE — Patient Instructions (Signed)
Try an over-the-counter antifungal such as generic terbinafine on your knee. You can alternate this with your hydrocortisone cream. Let me know if this is not improved within 1 to 2 weeks. You can certainly use her handicap parking placard as needed. I agree that your balance is very poor.  I am going to refer you to neurology for consultation, to see if we can determine why this may be.  I am hopeful we may then be able to improve your balance.

## 2018-08-27 DIAGNOSIS — M25561 Pain in right knee: Secondary | ICD-10-CM | POA: Diagnosis not present

## 2018-08-27 DIAGNOSIS — M25562 Pain in left knee: Secondary | ICD-10-CM | POA: Diagnosis not present

## 2018-08-27 DIAGNOSIS — G8918 Other acute postprocedural pain: Secondary | ICD-10-CM | POA: Diagnosis not present

## 2018-08-29 DIAGNOSIS — M25561 Pain in right knee: Secondary | ICD-10-CM | POA: Diagnosis not present

## 2018-08-29 DIAGNOSIS — R2689 Other abnormalities of gait and mobility: Secondary | ICD-10-CM | POA: Diagnosis not present

## 2018-08-29 DIAGNOSIS — M25562 Pain in left knee: Secondary | ICD-10-CM | POA: Diagnosis not present

## 2018-08-29 DIAGNOSIS — G8928 Other chronic postprocedural pain: Secondary | ICD-10-CM | POA: Diagnosis not present

## 2018-09-05 DIAGNOSIS — Z4789 Encounter for other orthopedic aftercare: Secondary | ICD-10-CM | POA: Diagnosis not present

## 2018-09-09 ENCOUNTER — Encounter: Payer: Self-pay | Admitting: Family Medicine

## 2018-09-09 DIAGNOSIS — G8928 Other chronic postprocedural pain: Secondary | ICD-10-CM | POA: Diagnosis not present

## 2018-09-09 DIAGNOSIS — M25562 Pain in left knee: Secondary | ICD-10-CM | POA: Diagnosis not present

## 2018-09-09 DIAGNOSIS — S63602D Unspecified sprain of left thumb, subsequent encounter: Secondary | ICD-10-CM

## 2018-09-09 DIAGNOSIS — L989 Disorder of the skin and subcutaneous tissue, unspecified: Secondary | ICD-10-CM

## 2018-09-09 DIAGNOSIS — R2689 Other abnormalities of gait and mobility: Secondary | ICD-10-CM | POA: Diagnosis not present

## 2018-09-09 DIAGNOSIS — M25561 Pain in right knee: Secondary | ICD-10-CM | POA: Diagnosis not present

## 2018-09-13 ENCOUNTER — Ambulatory Visit: Payer: Medicare Other | Admitting: Psychology

## 2018-09-17 DIAGNOSIS — Z4789 Encounter for other orthopedic aftercare: Secondary | ICD-10-CM | POA: Diagnosis not present

## 2018-09-18 ENCOUNTER — Encounter: Payer: Self-pay | Admitting: Family Medicine

## 2018-09-18 ENCOUNTER — Ambulatory Visit (INDEPENDENT_AMBULATORY_CARE_PROVIDER_SITE_OTHER): Payer: Medicare Other | Admitting: Family Medicine

## 2018-09-18 VITALS — BP 134/67 | HR 51 | Ht 61.0 in | Wt 188.0 lb

## 2018-09-18 DIAGNOSIS — M25542 Pain in joints of left hand: Secondary | ICD-10-CM

## 2018-09-18 NOTE — Patient Instructions (Signed)
Your exam and ultrasound are reassuring. I suspect you sprained your MCP joint (Grade 2) but it's mostly healed at this point. Wear thumb spica brace to rest this as much as possible. Tylenol, icing as needed. Start occupational therapy to work on regaining your strength and to help with the pain. Follow up with me in 1 month for reevaluation.

## 2018-09-19 ENCOUNTER — Encounter: Payer: Self-pay | Admitting: Family Medicine

## 2018-09-19 NOTE — Progress Notes (Signed)
PCP and consultation requested by: Copland, Gay Filler, MD  Subjective:   HPI: Patient is a 68 y.o. female here for left thumb injury.  Patient reports on November 13 she accidentally fell backwards after losing her balance and believes she may have sustained a [place] injury to her left hand. She had fairly severe pain in her left thumb to the radial aspect of the hand that has persisted to this point with 7 out of 10 pain when she tries to use it or grip something. Pain is centered around the MCP joint. She had radiographs that were negative. She has tried an over-the-counter brace and some Tylenol with minimal benefit. No skin changes, numbness.  Past Medical History:  Diagnosis Date  . Atrial fibrillation ([place])   . Depression   . Diverticulitis   . Diverticulosis   . Esophageal ulcer   . GERD (gastroesophageal reflux disease)   . Hyperlipidemia   . Hypertension   . Hypothyroid   . OSA (obstructive sleep apnea) 08/31/2016  . Osteopenia     Current Outpatient Medications on File Prior to Visit  Medication Sig Dispense Refill  . amLODipine (NORVASC) 5 MG tablet Take 1 tablet (5 mg total) by mouth daily. <PLEASE MAKE APPOINTMENT FOR REFILLS> 30 tablet 0  . atenolol (TENORMIN) 50 MG tablet TAKE 1 TABLET DAILY 90 tablet 1  . BIOTIN PO Take 1 tablet by mouth daily.    . Calcium Carb-Cholecalciferol (CALCIUM 600 + D PO) Take by mouth.    . Cholecalciferol (D 2000) 2000 units TABS Take 1 tablet by mouth daily.    . flecainide (TAMBOCOR) 100 MG tablet Take 1 tablet (100 mg total) by mouth 2 (two) times daily. 180 tablet 3  . FLUoxetine (PROZAC) 40 MG capsule Take 1 capsule (40 mg total) by mouth 2 (two) times daily. 180 capsule 3  . Krill Oil 1000 MG CAPS Take 1 capsule by mouth daily.    [name] lactobacillus acidophilus (BACID) TABS tablet Take 1 tablet by mouth daily.    [name] levothyroxine (SYNTHROID, LEVOTHROID) 125 MCG tablet TAKE 1 TABLET DAILY BEFORE BREAKFAST 90 tablet 0  . lovastatin  (MEVACOR) 40 MG tablet Take 1 tablet (40 mg total) by mouth at bedtime. 90 tablet 3  . LUTEIN PO Take 1 capsule by mouth daily.    . magnesium 30 MG tablet Take 30 mg by mouth daily.    [name] omeprazole (PRILOSEC) 40 MG capsule Take 40 mg by mouth 2 (two) times daily.    . Potassium 75 MG TABS Take 1 tablet by mouth daily.    . rivaroxaban (XARELTO) 20 MG TABS tablet TAKE 1 TABLET DAILY WITH   SUPPER 21 tablet 0  . traZODone (DESYREL) 50 MG tablet Take 1 tablet (50 mg total) by mouth at bedtime. 90 tablet 3  . Turmeric 450 MG CAPS Take 1 capsule by mouth daily.     No current facility-administered medications on file prior to visit.     Past Surgical History:  Procedure Laterality Date  . BREAST BIOPSY Left    needle core biopsy, benign  . CATARACT EXTRACTION    . HAND SURGERY    . KNEE SURGERY    . ROTATOR CUFF REPAIR    . TOTAL ABDOMINAL HYSTERECTOMY      Allergies  Allergen Reactions  . Dextran Anaphylaxis  . Dextrans   . Dextromethorphan Hbr Other (See Comments)    hallucinations  . Tree Extract   . Penicillin G Rash  .  Penicillins Rash  . Sulfa Antibiotics Rash  . Sulfacetamide Sodium Rash    Social History   Socioeconomic History  . Marital status: Married    Spouse name: Jenny Reichmann  . Number of children: 1  . Years of education: College  . Highest education level: Not on file  Occupational History  . Occupation: Conservator, museum/gallery: OTHER    Comment: [employer]  . Financial resource strain: Not on file  . Food insecurity:    Worry: Not on file    Inability: Not on file  . Transportation needs:    Medical: Not on file    Non-medical: Not on file  Tobacco Use  . Smoking status: Never Smoker  . Smokeless tobacco: Never Used  Substance and Sexual Activity  . Alcohol use: Yes    Comment: rare  . Drug use: No  . Sexual activity: Not Currently  Lifestyle  . Physical activity:    Days per week: Not on file    Minutes per session:  Not on file  . Stress: Not on file  Relationships  . Social connections:    Talks on phone: Not on file    Gets together: Not on file    Attends religious service: Not on file    Active member of club or organization: Not on file    Attends meetings of clubs or organizations: Not on file    Relationship status: Not on file  . Intimate partner violence:    Fear of current or ex partner: Not on file    Emotionally abused: Not on file    Physically abused: Not on file    Forced sexual activity: Not on file  Other Topics Concern  . Not on file  Social History Narrative   Patient lives at home spouse.   Caffeine use: 2 sodas weelky    Family History  Problem Relation Age of Onset  . Atrial fibrillation Mother   . Congestive Heart Failure Mother   . Heart disease Father   . Heart disease Brother   . Heart disease Brother     BP 134/67   Pulse (!) 51   Ht 5\' 1"  (1.549 m)   Wt 188 lb (85.3 kg)   BMI 35.52 kg/m   Review of Systems: See HPI above.     Objective:  Physical Exam:  Gen: NAD, comfortable in exam room  Left thumb: No gross deformity, swelling, bruising. Minimal tenderness to palpation over UCL at MCP joint.  No other tenderness. Full range of motion with 5 out of 5 strength with flexion and extension. No laxity with valgus and varus at IP or MCP joints. Neurovascular intact distally.  Right thumb: No deformity. FROM with 5/5 strength. No tenderness to palpation. NVI distally.   MSK u/s: No cortical irregularity or edema overlying the cortices of first metacarpal, proximal or distal phalanges.  Extensor and flexor tendons are intact.  No stener lesion.  UCL appears mildly hypoechoic but with intact fibers.  No effusions.  Assessment & Plan:  1. Left thumb injury -independently reviewed radiographs no evidence of fracture.  Patient's history and exam consistent with likely grade 2 UCL sprain at the MCP joint that has mostly healed at this point.   Encouraged her to wear a thumb spica brace over the next month, take Tylenol and ice this as needed.  She will start occupational therapy to work on regaining her strength and help with  the pain.  Follow with me in 1 month for reevaluation.

## 2018-09-20 ENCOUNTER — Telehealth: Payer: Self-pay

## 2018-09-20 DIAGNOSIS — F320 Major depressive disorder, single episode, mild: Secondary | ICD-10-CM

## 2018-09-20 NOTE — Telephone Encounter (Signed)
Copied from [place] [id number]. Topic: General - Other >> Sep 19, 2018 11:13 AM Leward Quan A wrote: Reason for CRM: Patient called to say that [facility] did not receive the referral and this is their fax number # [phone]. Asking for referral to be resent please.

## 2018-09-23 NOTE — Addendum Note (Signed)
Addended by: Lamar Blinks C on: 09/23/2018 10:01 AM   Modules accepted: Orders

## 2018-09-24 DIAGNOSIS — G8928 Other chronic postprocedural pain: Secondary | ICD-10-CM | POA: Diagnosis not present

## 2018-09-24 DIAGNOSIS — R2689 Other abnormalities of gait and mobility: Secondary | ICD-10-CM | POA: Diagnosis not present

## 2018-09-24 DIAGNOSIS — M25562 Pain in left knee: Secondary | ICD-10-CM | POA: Diagnosis not present

## 2018-09-26 ENCOUNTER — Ambulatory Visit (INDEPENDENT_AMBULATORY_CARE_PROVIDER_SITE_OTHER): Payer: Medicare Other | Admitting: Psychiatry

## 2018-09-26 ENCOUNTER — Encounter (HOSPITAL_COMMUNITY): Payer: Self-pay | Admitting: Psychiatry

## 2018-09-26 VITALS — BP 109/73 | HR 52 | Ht 61.0 in | Wt 189.0 lb

## 2018-09-26 DIAGNOSIS — F411 Generalized anxiety disorder: Secondary | ICD-10-CM

## 2018-09-26 DIAGNOSIS — F331 Major depressive disorder, recurrent, moderate: Secondary | ICD-10-CM

## 2018-09-26 MED ORDER — ARIPIPRAZOLE 2 MG PO TABS: 2.0000 mg | ORAL_TABLET | Freq: Every day | ORAL | 0 refills | Status: DC

## 2018-09-26 NOTE — Progress Notes (Signed)
Psychiatric Initial Adult Assessment   Patient Identification: Lisa Acosta MRN:  [id number] Date of Evaluation:  09/26/2018 Referral Source: Lamar Blinks MD  Chief Complaint:   Depression, apathy, anhedonia, increased sleep, worrying excessively   Visit Diagnosis:    ICD-10-CM   1. Moderate episode of recurrent major depressive disorder (HCC) F33.1   2. Generalized anxiety disorder F41.1     History of Present Illness:  68 yo married female with over 25 year hx of treatment for recurrent depression whose mood over past 6 months has declined. She rates her depression at 8 on a scale 0-10. She denies having suicidal thoughts now or ever before. She has no hx of psychiatric hospitalizations. She lists several family stressors: daughter and husband are living upstairs and have an 65 yo blind son with autism who often has behavioral outburst, her husband does not tolerate it and is angry all the time. He also abuses alcohol. Patient has tried various antidepressants in the past (Celexa, Zoloft, Paxil, Wellbutrin) and currently takes 80 mg of Prozac. When Wellbutrin was added to fluoxetine she noticed improvement in mood but developed severe tremor and had to stop bupropion after two weeks.In addition to depressed mood, anhedonia, apathy she also reports excessive worrying about her social situation, medical problems (atrial fibrillation, arthritis, hypothyroidism, GERD, dyslipidemia) - again this appears to have worsened over past 6 months. She has no hx of mania, psychosis, alcohol or drug abuse.  Associated Signs/Symptoms: Depression Symptoms:  depressed mood, anhedonia, hypersomnia, difficulty concentrating, hopelessness, anxiety, (Hypo) Manic Symptoms:  None Anxiety Symptoms:  Excessive Worry, Psychotic Symptoms:  None PTSD Symptoms: Negative  Past Psychiatric History: see above  Previous Psychotropic Medications: Yes   Substance Abuse History in the last 12 months:   No.  Consequences of Substance Abuse: NA  Past Medical History:  Past Medical History:  Diagnosis Date  . Atrial fibrillation ([place])   . Depression   . Diverticulitis   . Diverticulosis   . Esophageal ulcer   . GERD (gastroesophageal reflux disease)   . Hyperlipidemia   . Hypertension   . Hypothyroid   . OSA (obstructive sleep apnea) 08/31/2016  . Osteopenia     Past Surgical History:  Procedure Laterality Date  . BREAST BIOPSY Left    needle core biopsy, benign  . CATARACT EXTRACTION    . HAND SURGERY    . KNEE SURGERY    . ROTATOR CUFF REPAIR    . TOTAL ABDOMINAL HYSTERECTOMY      Family Psychiatric History: reviewed.  Family History:  Family History  Problem Relation Age of Onset  . Atrial fibrillation Mother   . Congestive Heart Failure Mother   . Depression Mother   . Heart disease Father   . Alcohol abuse Father   . Heart disease Brother   . Heart disease Brother   . Depression Sister   . Depression Daughter     Social History:   Social History   Socioeconomic History  . Marital status: Married    Spouse name: Jenny Reichmann  . Number of children: 1  . Years of education: College  . Highest education level: Not on file  Occupational History  . Occupation: Conservator, museum/gallery: OTHER    Comment: [employer]  . Financial resource strain: Not hard at all  . Food insecurity:    Worry: Never true    Inability: Never true  . Transportation needs:    Medical: No  Non-medical: No  Tobacco Use  . Smoking status: Never Smoker  . Smokeless tobacco: Never Used  Substance and Sexual Activity  . Alcohol use: Yes    Comment: rare  . Drug use: No  . Sexual activity: Not Currently  Lifestyle  . Physical activity:    Days per week: 0 days    Minutes per session: 0 min  . Stress: Rather much  Relationships  . Social connections:    Talks on phone: Not on file    Gets together: Not on file    Attends religious service: More  than 4 times per year    Active member of club or organization: No    Attends meetings of clubs or organizations: Never    Relationship status: Married  Other Topics Concern  . Not on file  Social History Narrative   Patient lives at home spouse.   Caffeine use: 2 sodas weelky    Additional Social History: Daughter and husband are living upstairs and have an 78 yo blind son with autism who often has behavioral outbursts.  Allergies:   Allergies  Allergen Reactions  . Dextran Anaphylaxis  . Dextrans   . Dextromethorphan Hbr Other (See Comments)    hallucinations  . Tree Extract   . Penicillin G Rash  . Penicillins Rash  . Sulfa Antibiotics Rash  . Sulfacetamide Sodium Rash    Metabolic Disorder Labs: Lab Results  Component Value Date   HGBA1C 5.8 04/15/2018   No results found for: PROLACTIN Lab Results  Component Value Date   CHOL 203 (H) 04/15/2018   TRIG 192.0 (H) 04/15/2018   HDL 54.50 04/15/2018   CHOLHDL 4 04/15/2018   VLDL 38.4 04/15/2018   LDLCALC 110 (H) 04/15/2018   Lab Results  Component Value Date   TSH 1.71 04/15/2018    Therapeutic Level Labs: No results found for: LITHIUM No results found for: CBMZ No results found for: VALPROATE  Current Medications: Current Outpatient Medications  Medication Sig Dispense Refill  . amLODipine (NORVASC) 5 MG tablet Take 1 tablet (5 mg total) by mouth daily. <PLEASE MAKE APPOINTMENT FOR REFILLS> 30 tablet 0  . atenolol (TENORMIN) 50 MG tablet TAKE 1 TABLET DAILY 90 tablet 1  . BIOTIN PO Take 1 tablet by mouth daily.    . Calcium Carb-Cholecalciferol (CALCIUM 600 + D PO) Take by mouth.    . Cholecalciferol (D 2000) 2000 units TABS Take 1 tablet by mouth daily.    . flecainide (TAMBOCOR) 100 MG tablet Take 1 tablet (100 mg total) by mouth 2 (two) times daily. 180 tablet 3  . FLUoxetine (PROZAC) 40 MG capsule Take 1 capsule (40 mg total) by mouth 2 (two) times daily. 180 capsule 3  . Krill Oil 1000 MG CAPS Take 1  capsule by mouth daily.    [name] lactobacillus acidophilus (BACID) TABS tablet Take 1 tablet by mouth daily.    [name] levothyroxine (SYNTHROID, LEVOTHROID) 125 MCG tablet TAKE 1 TABLET DAILY BEFORE BREAKFAST 90 tablet 0  . lovastatin (MEVACOR) 40 MG tablet Take 1 tablet (40 mg total) by mouth at bedtime. 90 tablet 3  . LUTEIN PO Take 1 capsule by mouth daily.    . magnesium 30 MG tablet Take 30 mg by mouth daily.    [name] omeprazole (PRILOSEC) 40 MG capsule Take 40 mg by mouth 2 (two) times daily.    . Potassium 75 MG TABS Take 1 tablet by mouth daily.    . rivaroxaban (XARELTO) 20 MG  TABS tablet TAKE 1 TABLET DAILY WITH   SUPPER 21 tablet 0  . traZODone (DESYREL) 50 MG tablet Take 1 tablet (50 mg total) by mouth at bedtime. 90 tablet 3  . Turmeric 450 MG CAPS Take 1 capsule by mouth daily.    . ARIPiprazole (ABILIFY) 2 MG tablet Take 1 tablet (2 mg total) by mouth daily for 30 days. 30 tablet 0   No current facility-administered medications for this visit.     Musculoskeletal: Strength & Muscle Tone: within normal limits Gait & Station: Uses cane to ambulate Patient leans: Front  Psychiatric Specialty Exam: Review of Systems  Constitutional: Negative.   HENT: Negative.   Eyes: Negative.   Respiratory: Negative.   Cardiovascular: Negative.   Gastrointestinal: Positive for heartburn.  Genitourinary: Negative.   Musculoskeletal: Positive for falls and joint pain.  Skin: Negative.   Neurological: Positive for tremors.  Psychiatric/Behavioral: Positive for depression. The patient is nervous/anxious and has insomnia.     Blood pressure 109/73, pulse (!) 52, height 5\' 1"  (1.549 m), weight 189 lb (85.7 kg), SpO2 97 %.Body mass index is 35.71 kg/m.  General Appearance: Well Groomed  Eye Contact:  Good  Speech:  Clear and Coherent  Volume:  Normal  Mood:  Anxious and Depressed  Affect:  Appropriate and Full Range  Thought Process:  Coherent  Orientation:  Full (Time, Place, and Person)   Thought Content:  Logical  Suicidal Thoughts:  No  Homicidal Thoughts:  No  Memory:  Immediate;   Good Recent;   Good Remote;   Good  Judgement:  Good  Insight:  Good  Psychomotor Activity:  Normal  Concentration:  Concentration: Good  Recall:  Good  Fund of Knowledge:Good  Language: Good  Akathisia:  Negative  Handed:  Right  AIMS (if indicated):  not done  Assets:  Communication Skills Desire for Improvement Housing Resilience  ADL's:  Intact  Cognition: Intact  Sleep:  Fair   Screenings: Mini-Mental     Office Visit from 08/13/2017 in [facility] at [facility]  Total Score (max 30 points )  30    PHQ2-9     Clinical Support from 01/11/2018 in [facility] at [place] Visit from 01/22/2017 in [facility] [place] Visit from 02/24/2016 in [facility] at [place] Visit from 12/08/2015 in [facility]  PHQ-2 Total Score  0  0  0  2  PHQ-9 Total Score  -  -  -  5    Hamilton Depression scale 17 item score - 16  Assessment and Plan: 68 yo married female with major depressive disorder and generalized anxiety who over past 6 month noticed that her mood has been declining despite taking high dose of fluoxetine. Brief improvement after addition of Wellbutrin but could not tolerate side effects. Her PCP proposed switch to Trintellix but patient finds cost of that medication prohibitive. Several family stressors which are not going to disappear. She has tried counseling in the past but did not find it of much benefit. She did well on Prozac but it appears to have stopped working after being few years on it. We have discussed various options (augmentation with lithium, methylphenidate, mirtazapine, switching to a SNRI) and eventually decided on a trial of Abilify augmentation.  Plan: 1. Continue to take Prozac 80  mg daily.  2. Start Abilify 2  mg in AM one hour after breakfast. If no visible benefit in 10 days increase dose to two tablets of Abilify in AM.  3. Continue trazodone as needed for insomnia. She will return to clinic in 3 weeks and if no improvement with Abilify we will cross taper fluoxetine to duloxetine next. I spend 55 minutes in direct face to face clinical contact with the patient and devoted approximately 50% of this time to med education and coordination of care with the patient.  Stephanie Acre, MD 1/16/202012:09 PM

## 2018-09-26 NOTE — Patient Instructions (Signed)
ABILIFY - ne medication to augment Prozac.  1. Continue to take Prozac 80 mg daily.  2. Start Abilify 2 mg in AM one hour after breakfast. If no visible benefit in 10 days increase dose to two tablets of Abilify in AM.  3. Continue trazodone as needed for insomnia.  4. Please come back to see me in 3 weeks.

## 2018-10-01 DIAGNOSIS — Z4789 Encounter for other orthopedic aftercare: Secondary | ICD-10-CM | POA: Diagnosis not present

## 2018-10-02 DIAGNOSIS — L308 Other specified dermatitis: Secondary | ICD-10-CM | POA: Diagnosis not present

## 2018-10-08 ENCOUNTER — Encounter: Payer: Self-pay | Admitting: Family Medicine

## 2018-10-08 DIAGNOSIS — M25561 Pain in right knee: Secondary | ICD-10-CM | POA: Diagnosis not present

## 2018-10-08 DIAGNOSIS — M25562 Pain in left knee: Secondary | ICD-10-CM | POA: Diagnosis not present

## 2018-10-08 DIAGNOSIS — G8928 Other chronic postprocedural pain: Secondary | ICD-10-CM | POA: Diagnosis not present

## 2018-10-08 DIAGNOSIS — R2689 Other abnormalities of gait and mobility: Secondary | ICD-10-CM

## 2018-10-10 ENCOUNTER — Telehealth: Payer: Self-pay | Admitting: *Deleted

## 2018-10-10 ENCOUNTER — Ambulatory Visit: Payer: Medicare Other | Admitting: Neurology

## 2018-10-10 NOTE — Telephone Encounter (Signed)
She has a new patient appt today at 3:30pm (arrival at 3pm).  Dr. Krista Blue has to leave the office early but has offered to see this new patient during lunch, so she can still be seen today.  I left a message for the patient providing this information.  I asked her to call us back to let us know if she can make a noon appt (arrival at 11:30am).

## 2018-10-10 NOTE — Telephone Encounter (Signed)
The patient return the call and spoke to new patient department.  She rescheduled for Monday, 10/14/2018.

## 2018-10-11 ENCOUNTER — Other Ambulatory Visit: Payer: Self-pay | Admitting: Gastroenterology

## 2018-10-11 ENCOUNTER — Telehealth: Payer: Self-pay | Admitting: Cardiology

## 2018-10-11 DIAGNOSIS — K828 Other specified diseases of gallbladder: Secondary | ICD-10-CM

## 2018-10-11 DIAGNOSIS — R1010 Upper abdominal pain, unspecified: Secondary | ICD-10-CM | POA: Diagnosis not present

## 2018-10-11 DIAGNOSIS — R110 Nausea: Secondary | ICD-10-CM | POA: Diagnosis not present

## 2018-10-11 DIAGNOSIS — R1310 Dysphagia, unspecified: Secondary | ICD-10-CM | POA: Diagnosis not present

## 2018-10-11 DIAGNOSIS — R140 Abdominal distension (gaseous): Secondary | ICD-10-CM | POA: Diagnosis not present

## 2018-10-11 NOTE — Telephone Encounter (Signed)
   [facility] Pre-operative Risk Assessment    Request for surgical clearance:  1. What type of surgery is being performed? Endoscopy    2. When is this surgery scheduled? Oct 18, 2018   3. What type of clearance is required (medical clearance vs. Pharmacy clearance to hold med vs. Both)? Both   4. Are there any medications that need to be held prior to surgery and how long? Xarelto - 3 days prior   5. Practice name and name of physician performing surgery? Dr. Therisa Doyne @ [facility]    6. What is your office phone number [phone]    7.   What is your office fax number [phone]  8.   Anesthesia type (None, local, MAC, general) ? Propofol    Sheral Apley M 10/11/2018, 3:50 PM  _________________________________________________________________   (provider comments below)

## 2018-10-14 ENCOUNTER — Other Ambulatory Visit: Payer: Self-pay | Admitting: Family Medicine

## 2018-10-14 ENCOUNTER — Ambulatory Visit (INDEPENDENT_AMBULATORY_CARE_PROVIDER_SITE_OTHER): Payer: Medicare Other | Admitting: Neurology

## 2018-10-14 ENCOUNTER — Encounter: Payer: Self-pay | Admitting: Neurology

## 2018-10-14 VITALS — BP 128/68 | HR 52 | Ht 60.0 in | Wt 190.0 lb

## 2018-10-14 DIAGNOSIS — M25542 Pain in joints of left hand: Secondary | ICD-10-CM | POA: Diagnosis not present

## 2018-10-14 DIAGNOSIS — R269 Unspecified abnormalities of gait and mobility: Secondary | ICD-10-CM | POA: Diagnosis not present

## 2018-10-14 DIAGNOSIS — E538 Deficiency of other specified B group vitamins: Secondary | ICD-10-CM | POA: Diagnosis not present

## 2018-10-14 DIAGNOSIS — M79645 Pain in left finger(s): Secondary | ICD-10-CM | POA: Diagnosis not present

## 2018-10-14 DIAGNOSIS — F0781 Postconcussional syndrome: Secondary | ICD-10-CM | POA: Diagnosis not present

## 2018-10-14 DIAGNOSIS — E034 Atrophy of thyroid (acquired): Secondary | ICD-10-CM

## 2018-10-14 DIAGNOSIS — S63642S Sprain of metacarpophalangeal joint of left thumb, sequela: Secondary | ICD-10-CM | POA: Diagnosis not present

## 2018-10-14 DIAGNOSIS — I480 Paroxysmal atrial fibrillation: Secondary | ICD-10-CM

## 2018-10-14 HISTORY — DX: Unspecified abnormalities of gait and mobility: R26.9

## 2018-10-14 HISTORY — DX: Postconcussional syndrome: F07.81

## 2018-10-14 NOTE — Progress Notes (Signed)
Reason for visit: Gait disorder  Referring physician: Dr. Lajean Saver is a 68 y.o. female  History of present illness:  Lisa Acosta is a 68 year old white female with a history of some problems with falling that began in March 2019.  The patient has had several falls since that time, the last such fall occurred around 24 July 2018.  The patient has been in physical therapy for several months, she is now using a cane for ambulation.  She has a sensation of imbalance when she is not using the cane, if she reaches over and touches a wall or table she feels better with her stability.  She reports no numbness of the extremities, she does have occasional neck and low back pain.  She denies pain down the arms or the legs.  She denies issues controlling the bowels or the bladder.  Secondary to the falls she has sustained concussions and she has had some word finding problems and some difficulty with directions with driving.  A CT scan of the brain has been done on 2 occasions and does not show any acute changes, mild small vessel disease is noted.  The patient has a history of right central retinal venous occlusion, she has some macular edema associated with this.  The patient reports no dizziness, speech disturbance or vision disturbance associated with her gait problem.  She does have a history of significant depression, she is on Abilify but this just was recently added, she has not been on any Valium like medications.  She does not drink alcohol frequently.  Past Medical History:  Diagnosis Date  . Atrial fibrillation (Lisa Acosta)   . Depression   . Diverticulitis   . Diverticulosis   . Esophageal ulcer   . Gait abnormality 10/14/2018  . GERD (gastroesophageal reflux disease)   . Hyperlipidemia   . Hypertension   . Hypothyroid   . OSA (obstructive sleep apnea) 08/31/2016  . Osteopenia   . 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 BREAST BIOPSY Left    needle core biopsy, benign  . CATARACT EXTRACTION    . HAND SURGERY    . KNEE SURGERY    . ROTATOR CUFF REPAIR    . TOTAL ABDOMINAL HYSTERECTOMY      Family History  Problem Relation Age of Onset  . Atrial fibrillation Mother   . Congestive Heart Failure Mother   . Depression Mother   . Heart disease Father   . Alcohol abuse Father   . Heart disease Brother   . Heart disease Brother   . Depression Sister   . Depression Daughter     Social history:  reports that she has never smoked. She has never used smokeless tobacco. She reports current alcohol use. She reports that she does not use drugs.  Medications:  Prior to Admission medications   Medication Sig Start Date End Date Taking? Authorizing Provider  amLODipine (NORVASC) 5 MG tablet Take 1 tablet (5 mg total) by mouth daily. <PLEASE MAKE APPOINTMENT FOR REFILLS> 10/30/17  Yes Crenshaw, Denice Bors, MD  ARIPiprazole (ABILIFY) 2 MG tablet Take 1 tablet (2 mg total) by mouth daily for 30 days. 09/26/18 10/26/18 Yes Pucilowski, Olgierd A, MD  atenolol (TENORMIN) 50 MG tablet TAKE 1 TABLET DAILY 08/05/18  Yes Copland, Gay Filler, MD  BIOTIN PO Take 1 tablet by mouth daily.   Yes [provider]  Calcium Carb-Cholecalciferol (CALCIUM 600 +  D PO) Take by mouth.   Yes [provider]  Cholecalciferol (D 2000) 2000 units TABS Take 1 tablet by mouth daily.   Yes [provider]  flecainide (TAMBOCOR) 100 MG tablet Take 1 tablet (100 mg total) by mouth 2 (two) times daily. 10/29/17 10/29/18 Yes Copland, Gay Filler, MD  FLUoxetine (PROZAC) 40 MG capsule Take 1 capsule (40 mg total) by mouth 2 (two) times daily. 12/10/17  Yes Copland, Gay Filler, MD  Krill Oil 1000 MG CAPS Take 1 capsule by mouth daily.   Yes [provider]  lactobacillus acidophilus (BACID) TABS tablet Take 1 tablet by mouth daily.   Yes [provider]  levothyroxine (SYNTHROID, LEVOTHROID) 125 MCG tablet  TAKE 1 TABLET DAILY BEFORE BREAKFAST 07/15/18  Yes Copland, Gay Filler, MD  lovastatin (MEVACOR) 40 MG tablet Take 1 tablet (40 mg total) by mouth at bedtime. 04/02/18  Yes Copland, Gay Filler, MD  LUTEIN PO Take 1 capsule by mouth daily.   Yes [provider]  magnesium 30 MG tablet Take 30 mg by mouth daily.   Yes [provider]  omeprazole (PRILOSEC) 40 MG capsule Take 40 mg by mouth 2 (two) times daily.   Yes [provider]  Potassium 75 MG TABS Take 1 tablet by mouth daily.   Yes [provider]  rivaroxaban (XARELTO) 20 MG TABS tablet TAKE 1 TABLET DAILY WITH   SUPPER 06/20/18  Yes Lelon Perla, MD  traZODone (DESYREL) 50 MG tablet Take 1 tablet (50 mg total) by mouth at bedtime. 01/22/17  Yes Copland, Gay Filler, MD  Turmeric 450 MG CAPS Take 1 capsule by mouth daily.   Yes [provider]      Allergies  Allergen Reactions  . Dextran Anaphylaxis  . Dextrans   . Dextromethorphan Hbr Other (See Comments)    hallucinations  . Tree Extract   . Penicillin G Rash  . Penicillins Rash  . Sulfa Antibiotics Rash  . Sulfacetamide Sodium Rash    ROS:  Out of a complete 14 system review of symptoms, the patient complains only of the following symptoms, and all other reviewed systems are negative.  Atrial fibrillation Hearing loss Feeling cold Joint pain Confusion, tremor Insomnia  Blood pressure 128/68, pulse (!) 52, height 5' (1.524 m), weight 190 lb (86.2 kg), SpO2 93 %.  Physical Exam  General: The patient is alert and cooperative at the time of the examination.  Eyes: Pupils are equal, round, and reactive to light. Discs are flat bilaterally.  Neck: The neck is supple, no carotid bruits are noted.  Respiratory: The respiratory examination is clear.  Cardiovascular: The cardiovascular examination reveals a regular rate and rhythm, no obvious murmurs or rubs are noted.  Skin: Extremities are without significant  edema.  Neurologic Exam  Mental status: The patient is alert and oriented x 3 at the time of the examination. The patient has apparent normal recent and remote memory, with an apparently normal attention span and concentration ability.  Mini-Mental status examination done today shows a total score of 29/30.  Cranial nerves: Facial symmetry is present. There is good sensation of the face to pinprick and soft touch bilaterally. The strength of the facial muscles and the muscles to head turning and shoulder shrug are normal bilaterally. Speech is well enunciated, no aphasia or dysarthria is noted. Extraocular movements are full. Visual fields are full. The tongue is midline, and the patient has symmetric elevation of the soft palate. No  obvious hearing deficits are noted.  Motor: The motor testing reveals 5 over 5 strength of all 4 extremities. Good symmetric motor tone is noted throughout.  Sensory: Sensory testing is intact to pinprick, soft touch, vibration sensation, and position sense on all 4 extremities, with a slight decrease in vibration sensation in the right foot, not on the left. No evidence of extinction is noted.  Coordination: Cerebellar testing reveals good finger-nose-finger and heel-to-shin bilaterally.  Gait and station: Gait is normal. Tandem gait is minimally unsteady. Romberg is negative. No drift is seen.  Reflexes: Deep tendon reflexes are symmetric and normal bilaterally, knee jerk reflexes are well-maintained, ankle jerk reflexes are slightly depressed. Toes are downgoing bilaterally.   Assessment/Plan:  1.  Gait disturbance  2.  History of concussion, postconcussive encephalopathy  The patient has reported some troubles with memory and difficulty with directions with driving following her concussions.  She has had a mild gait disturbance, she is on blood thinners for atrial fibrillation.  The patient has a sensation of imbalance when she is not using the cane.  The  patient will be set up for blood work today, she will have nerve conductions on both legs and EMG on one leg.  We may consider MRI of the brain in the future.  She will follow-up otherwise in about 4 months.  Jill Alexanders MD 10/14/2018 9:56 AM  [facility] [address] [place], [place] [address]  Phone [phone] Fax [phone]

## 2018-10-15 ENCOUNTER — Encounter: Payer: Self-pay | Admitting: Family Medicine

## 2018-10-15 DIAGNOSIS — Z4789 Encounter for other orthopedic aftercare: Secondary | ICD-10-CM | POA: Diagnosis not present

## 2018-10-15 NOTE — Telephone Encounter (Signed)
Patient with diagnosis of afib on Xarelto for anticoagulation.    Procedure: Endoscopy Date of procedure: 10/18/2018  CHADS2-VASc score of  3 (CHF, HTN, AGE, DM2, stroke/tia x 2, CAD, AGE, female)  Per office protocol, patient can hold Xarelto for 2 days prior to procedure.

## 2018-10-15 NOTE — Telephone Encounter (Signed)
   Primary Cardiologist:Brian Stanford Breed, MD  Chart reviewed as part of pre-operative protocol coverage. Because of Lisa Acosta's past medical history and time since last visit, he/she will require a follow-up visit in order to better assess preoperative cardiovascular risk.  Upon my phone call, she stated that she was having pain under her left breast and wanted to know if she should get it checked out. I stated that, given her new symptoms, she would need an appt to be cleared for her endoscopy. Procedure scheduled for Friday. She is unwilling to reschedule her procedure. I am sending to callback pool to see if there is an open slot for an appt prior to Friday.  Pre-op covering staff: - Please schedule appointment and call patient to inform them. - Please contact requesting surgeon's office via preferred method (i.e, phone, fax) to inform them of need for appointment prior to surgery.  If applicable, this message will also be routed to pharmacy pool and/or primary cardiologist for input on holding anticoagulant/antiplatelet agent as requested below so that this information is available at time of patient's appointment.   Lisa Lin Lanay Zinda, PA  10/15/2018, 1:37 PM

## 2018-10-15 NOTE — Progress Notes (Signed)
Cardiology Office Note   Date:  10/16/2018   ID:  Lisa Acosta, DOB March 23, 1951, MRN [id number]  PCP:  Lisa Mclean, MD  Cardiologist: Lisa Acosta  Chief Complaint  Patient presents with  . Pre-op Exam  . Atrial Fibrillation     History of Present Illness: Lisa Acosta is a 68 y.o. female who presents for ongoing assessment of PAF, on Xarelto, Echo with Grade 1 diastolic dysfunction, trace mitral and tricuspid regurgitation.   ETT with mildly impaired exercise tolerance (4:20); no chest pain; normal BP response (SBP of 81 felt to be inaccurate); no ST changes; no exercise induced arrhythmias; negative inadequate ETT (pt achieved THR of 63% predicted).  She is here for pre-procedure cardiac recommendations for Xarelto in the setting of planned EDG with Dr.Karki on 10/18/2018 via [facility]. Lisa Acosta   Her only complaint is from intermittent upper left abdominal pain with nausea. She denies DOE, weakness, of dizziness. She is unhappy with weight gain from antidepressant medications.   Past Medical History:  Diagnosis Date  . Atrial fibrillation ([place])   . Depression   . Diverticulitis   . Diverticulosis   . Esophageal ulcer   . Gait abnormality 10/14/2018  . GERD (gastroesophageal reflux disease)   . Hyperlipidemia   . Hypertension   . Hypothyroid   . OSA (obstructive sleep apnea) 08/31/2016  . Osteopenia   . 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 BREAST BIOPSY Left    needle core biopsy, benign  . CATARACT EXTRACTION    . HAND SURGERY    . KNEE SURGERY    . ROTATOR CUFF REPAIR    . TOTAL ABDOMINAL HYSTERECTOMY       Current Outpatient Medications  Medication Sig Dispense Refill  . amLODipine (NORVASC) 5 MG tablet Take 1 tablet (5 mg total) by mouth daily. <PLEASE MAKE APPOINTMENT FOR REFILLS> 30 tablet 0  . ARIPiprazole (ABILIFY) 2 MG tablet Take 1 tablet (2 mg total) by mouth daily for 30 days. 30 tablet 0  .  atenolol (TENORMIN) 50 MG tablet TAKE 1 TABLET DAILY 90 tablet 1  . BIOTIN PO Take 1 tablet by mouth daily.    . Calcium Carb-Cholecalciferol (CALCIUM 600 + D PO) Take by mouth.    . Cholecalciferol (D 2000) 2000 units TABS Take 1 tablet by mouth daily.    . flecainide (TAMBOCOR) 100 MG tablet TAKE 1 TABLET TWICE A DAY 180 tablet 3  . FLUoxetine (PROZAC) 40 MG capsule Take 1 capsule (40 mg total) by mouth 2 (two) times daily. 180 capsule 3  . Krill Oil 1000 MG CAPS Take 1 capsule by mouth daily.    Lisa Acosta lactobacillus acidophilus (BACID) TABS tablet Take 1 tablet by mouth daily.    Lisa Acosta levothyroxine (SYNTHROID, LEVOTHROID) 125 MCG tablet Take 1 tablet (125 mcg total) by mouth daily before breakfast. 90 tablet 2  . lovastatin (MEVACOR) 40 MG tablet Take 1 tablet (40 mg total) by mouth at bedtime. 90 tablet 3  . LUTEIN PO Take 1 capsule by mouth daily.    . magnesium 30 MG tablet Take 30 mg by mouth daily.    Lisa Acosta omeprazole (PRILOSEC) 40 MG capsule Take 40 mg by mouth 2 (two) times daily.    . Potassium 75 MG TABS Take 1 tablet by mouth daily.    . rivaroxaban (XARELTO) 20 MG TABS tablet TAKE 1 TABLET DAILY WITH   SUPPER 21 tablet 0  . traZODone (  DESYREL) 50 MG tablet Take 1 tablet (50 mg total) by mouth at bedtime. 90 tablet 3  . Turmeric 450 MG CAPS Take 1 capsule by mouth daily.     No current facility-administered medications for this visit.     Allergies:   Dextran; Dextrans; Dextromethorphan hbr; Tree extract; Penicillin g; Penicillins; Sulfa antibiotics; and Sulfacetamide sodium    Social History:  The patient  reports that she has never smoked. She has never used smokeless tobacco. She reports current alcohol use. She reports that she does not use drugs.   Family History:  The patient's family history includes Alcohol abuse in her father; Atrial fibrillation in her mother; Congestive Heart Failure in her mother; Depression in her daughter, mother, and sister; Heart disease in her brother,  brother, and father.    ROS: All other systems are reviewed and negative. Unless otherwise mentioned in H&P    PHYSICAL EXAM: VS:  BP 112/60   Pulse (!) 54   Ht 5\' 1"  (1.549 m)   Wt 189 lb 12.8 oz (86.1 kg)   BMI 35.86 kg/m  , BMI Body mass index is 35.86 kg/m. GEN: Well nourished, well developed, in no acute distress HEENT: normal Neck: no JVD, carotid bruits, or masses Cardiac: RRR; no murmurs, rubs, or gallops,no edema  Respiratory:  Clear to auscultation bilaterally, normal work of breathing GI: soft, nontender, nondistended, + BS MS: no deformity or atrophy Skin: warm and dry, no rash Neuro:  Strength and sensation are intact Psych: euthymic mood, full affect   EKG:  Sinus bradycardia with HR of 54 bpm. QTc 424 ms.   Recent Labs: 04/15/2018: ALT 18; BUN 14; Creatinine, Ser 0.71; Hemoglobin 13.4; Platelets 275.0; Potassium 4.6; Sodium 141; TSH 1.71    Lipid Panel    Component Value Date/Time   CHOL 203 (H) 04/15/2018 1145   TRIG 192.0 (H) 04/15/2018 1145   HDL 54.50 04/15/2018 1145   CHOLHDL 4 04/15/2018 1145   VLDL 38.4 04/15/2018 1145   LDLCALC 110 (H) 04/15/2018 1145   LDLDIRECT 112.0 01/22/2017 1559      Wt Readings from Last 3 Encounters:  10/16/18 189 lb 12.8 oz (86.1 kg)  10/14/18 190 lb (86.2 kg)  09/18/18 188 lb (85.3 kg)    Other studies Reviewed:11/12/2015 Left ventricle: The cavity size was normal. Wall thickness was   normal. Systolic function was normal. The estimated ejection   fraction was in the range of 55% to 60%. Wall motion was normal;   there were no regional wall motion abnormalities. Doppler   parameters are consistent with abnormal left ventricular   relaxation (grade 1 diastolic dysfunction).  ASSESSMENT AND PLAN:  1. Pre-Operative Cardiac Evaluation:    Chart reviewed as part of pre-operative protocol coverage. Given past medical history and time since last visit, based on ACC/AHA guidelines, [name] would be at  acceptable risk for the planned procedure without further cardiovascular testing.  Holding Xarelto for 48 hours prior to EDG Start back ASAP after her procedure.   2. Atrial fib:  Heart rate is well controlled. She will hold the Xarelto as directed.    3. Hypertension: BP is well controlled currently. No changes in her regimen at this time.   Current medicines are reviewed at length with the patient today.    Labs/ tests ordered today include: None  Phill Myron. [place] Pugh, ANP, AACC   10/16/2018 10:08 AM    [facility] [place] [address] Office [phone]  Fax [phone]

## 2018-10-15 NOTE — Telephone Encounter (Signed)
Pt is scheduled to see Jory Sims, DNP on 2/5 for clearance.

## 2018-10-16 ENCOUNTER — Ambulatory Visit (INDEPENDENT_AMBULATORY_CARE_PROVIDER_SITE_OTHER): Payer: Medicare Other | Admitting: Adult Health

## 2018-10-16 ENCOUNTER — Encounter: Payer: Self-pay | Admitting: Adult Health

## 2018-10-16 ENCOUNTER — Ambulatory Visit: Payer: Medicare Other | Admitting: Family Medicine

## 2018-10-16 VITALS — BP 112/60 | HR 54 | Ht 61.0 in | Wt 189.8 lb

## 2018-10-16 DIAGNOSIS — I480 Paroxysmal atrial fibrillation: Secondary | ICD-10-CM | POA: Diagnosis not present

## 2018-10-16 DIAGNOSIS — Z01810 Encounter for preprocedural cardiovascular examination: Secondary | ICD-10-CM | POA: Diagnosis not present

## 2018-10-16 DIAGNOSIS — I10 Essential (primary) hypertension: Secondary | ICD-10-CM

## 2018-10-16 MED ORDER — LEVOTHYROXINE SODIUM 125 MCG PO TABS: 125.0000 ug | ORAL_TABLET | Freq: Every day | ORAL | 2 refills | Status: DC

## 2018-10-16 NOTE — Patient Instructions (Signed)
CLEARED FOR ENDOSCOPY-Dr. Therisa Doyne @ [facility]   Follow-Up: You will need a follow up appointment in 6 months.  Please call our office 2 months (June 2020) in advance to schedule this (AUGUST 2020) appointment.  You may see Kirk Ruths, MD or one of the following Advanced Practice Providers on your designated Care Team:  Kerin Ransom, [place]  Roby Lofts, PA-C  Sande Rives, [place]       Medication Instructions:  NO CHANGES- Your physician recommends that you continue on your current medications as directed. Please refer to the Current Medication list given to you today. If you need a refill on your cardiac medications before your next appointment, please call your pharmacy. Labwork: When you have labs (blood work) and your tests are completely normal, you will receive your results ONLY by [place] (if you have MyChart) -OR- A paper copy in the mail.  At [facility], you and your health needs are our priority.  As part of our continuing mission to provide you with exceptional heart care, we have created designated Provider Care Teams.  These Care Teams include your primary Cardiologist (physician) and Advanced Practice Providers (APPs -  Physician Assistants and Nurse Practitioners) who all work together to provide you with the care you need, when you need it.  Thank you for choosing [facility] at [facility]!!

## 2018-10-16 NOTE — Telephone Encounter (Signed)
Called her to discuss her flecainide rx She is actually seeing her cardiologist today Advised her that I can refill this rx, but I am otherwise not monitoring this drug.  She will be sure to mention this to her cardiologist today to see if any other monitoring is needed

## 2018-10-17 ENCOUNTER — Ambulatory Visit (HOSPITAL_COMMUNITY): Payer: Medicare Other | Admitting: Psychiatry

## 2018-10-17 LAB — ENA+DNA/DS+SJORGEN'S
ENA RNP Ab: <0.2 AI (ref 0.0–0.9)
ENA SM Ab Ser-aCnc: <0.2 AI (ref 0.0–0.9)
ENA SSA (RO) Ab: 1.3 AI — ABNORMAL HIGH (ref 0.0–0.9)
ENA SSB (LA) Ab: <0.2 AI (ref 0.0–0.9)
dsDNA Ab: <1 IU/mL (ref 0–9)

## 2018-10-17 LAB — ANA W/REFLEX: Anti Nuclear Antibody(ANA): POSITIVE — AB

## 2018-10-17 LAB — RPR: RPR Ser Ql: NONREACTIVE

## 2018-10-17 LAB — B. BURGDORFI ANTIBODIES: Lyme IgG/IgM Ab: <0.91 {ISR} (ref 0.00–0.90)

## 2018-10-17 LAB — VITAMIN B12: Vitamin B-12: 805 pg/mL (ref 232–1245)

## 2018-10-18 DIAGNOSIS — R1310 Dysphagia, unspecified: Secondary | ICD-10-CM | POA: Diagnosis not present

## 2018-10-18 DIAGNOSIS — R140 Abdominal distension (gaseous): Secondary | ICD-10-CM | POA: Diagnosis not present

## 2018-10-18 DIAGNOSIS — R110 Nausea: Secondary | ICD-10-CM | POA: Diagnosis not present

## 2018-10-18 DIAGNOSIS — K228 Other specified diseases of esophagus: Secondary | ICD-10-CM | POA: Diagnosis not present

## 2018-10-18 DIAGNOSIS — K2930 Chronic superficial gastritis without bleeding: Secondary | ICD-10-CM | POA: Diagnosis not present

## 2018-10-22 ENCOUNTER — Ambulatory Visit (INDEPENDENT_AMBULATORY_CARE_PROVIDER_SITE_OTHER): Payer: Medicare Other | Admitting: Psychiatry

## 2018-10-22 ENCOUNTER — Encounter (HOSPITAL_COMMUNITY): Payer: Self-pay | Admitting: Psychiatry

## 2018-10-22 DIAGNOSIS — F320 Major depressive disorder, single episode, mild: Secondary | ICD-10-CM | POA: Insufficient documentation

## 2018-10-22 DIAGNOSIS — Z4789 Encounter for other orthopedic aftercare: Secondary | ICD-10-CM | POA: Diagnosis not present

## 2018-10-22 MED ORDER — ARIPIPRAZOLE 2 MG PO TABS: 2.0000 mg | ORAL_TABLET | Freq: Every day | ORAL | 0 refills | Status: DC

## 2018-10-22 MED ORDER — FLUOXETINE HCL 40 MG PO CAPS: 40.0000 mg | ORAL_CAPSULE | Freq: Two times a day (BID) | ORAL | 0 refills | Status: DC

## 2018-10-22 NOTE — Progress Notes (Addendum)
BH MD/PA/NP OP Progress Note  10/22/2018 2:40 PM Lisa Acosta  MRN:  [id number]  Chief Complaint: Depressed mood. HPI: 68 yo married female with depression, single episode related to stressful living circumstances: grandson with autism, her husband and daughter frustrated by his behaviors. Patient has tried various antidepressants in the past - currently on a high dose of Prozac (80 mg) and on trazodone for insomnia - works well. We have added low dose of Abilify for augmentation and Lisa Acosta reports that she is less apathetic, feels as though her depression is beginning to lift. She reports mild akathisia with 2 mg of Abilify. Visit Diagnosis:    ICD-10-CM   1. Depression, major, single episode, mild (HCC) F32.0 FLUoxetine (PROZAC) 40 MG capsule    Past Psychiatric History: See full H$P, no new updates required.  Past Medical History:  Past Medical History:  Diagnosis Date  . Atrial fibrillation ([place])   . Depression   . Diverticulitis   . Diverticulosis   . Esophageal ulcer   . Gait abnormality 10/14/2018  . GERD (gastroesophageal reflux disease)   . Hyperlipidemia   . Hypertension   . Hypothyroid   . OSA (obstructive sleep apnea) 08/31/2016  . Osteopenia   . 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 BREAST BIOPSY Left    needle core biopsy, benign  . CATARACT EXTRACTION    . HAND SURGERY    . KNEE SURGERY    . ROTATOR CUFF REPAIR    . TOTAL ABDOMINAL HYSTERECTOMY      Family Psychiatric History: reviewed  Family History:  Family History  Problem Relation Age of Onset  . Atrial fibrillation Mother   . Congestive Heart Failure Mother   . Depression Mother   . Heart disease Father   . Alcohol abuse Father   . Heart disease Brother   . Heart disease Brother   . Depression Sister   . Depression Daughter     Social History:  Social History   Socioeconomic History  . Marital status: Married    Spouse name:  Jenny Reichmann  . Number of children: 1  . Years of education: College  . Highest education level: Not on file  Occupational History  . Occupation: Conservator, museum/gallery: OTHER    Comment: [employer]  . Financial resource strain: Not hard at all  . Food insecurity:    Worry: Never true    Inability: Never true  . Transportation needs:    Medical: No    Non-medical: No  Tobacco Use  . Smoking status: Never Smoker  . Smokeless tobacco: Never Used  Substance and Sexual Activity  . Alcohol use: Yes    Comment: rare  . Drug use: No  . Sexual activity: Not Currently  Lifestyle  . Physical activity:    Days per week: 0 days    Minutes per session: 0 min  . Stress: Rather much  Relationships  . Social connections:    Talks on phone: Not on file    Gets together: Not on file    Attends religious service: More than 4 times per year    Active member of club or organization: No    Attends meetings of clubs or organizations: Never    Relationship status: Married  Other Topics Concern  . Not on file  Social History Narrative   Patient lives at home spouse.   Caffeine use:  2 sodas weekly   Right handed     Allergies:  Allergies  Allergen Reactions  . Dextran Anaphylaxis  . Dextrans   . Dextromethorphan Hbr Other (See Comments)    hallucinations  . Tree Extract   . Penicillin G Rash  . Penicillins Rash  . Sulfa Antibiotics Rash  . Sulfacetamide Sodium Rash    Metabolic Disorder Labs: Lab Results  Component Value Date   HGBA1C 5.8 04/15/2018   No results found for: PROLACTIN Lab Results  Component Value Date   CHOL 203 (H) 04/15/2018   TRIG 192.0 (H) 04/15/2018   HDL 54.50 04/15/2018   CHOLHDL 4 04/15/2018   VLDL 38.4 04/15/2018   LDLCALC 110 (H) 04/15/2018   Lab Results  Component Value Date   TSH 1.71 04/15/2018   TSH 1.81 08/13/2017    Therapeutic Level Labs: No results found for: LITHIUM No results found for: VALPROATE No  components found for:  CBMZ  Current Medications: Current Outpatient Medications  Medication Sig Dispense Refill  . amLODipine (NORVASC) 5 MG tablet Take 1 tablet (5 mg total) by mouth daily. <PLEASE MAKE APPOINTMENT FOR REFILLS> 30 tablet 0  . ARIPiprazole (ABILIFY) 2 MG tablet Take 1 tablet (2 mg total) by mouth daily for 30 days. 30 tablet 0  . atenolol (TENORMIN) 50 MG tablet TAKE 1 TABLET DAILY 90 tablet 1  . BIOTIN PO Take 1 tablet by mouth daily.    . Calcium Carb-Cholecalciferol (CALCIUM 600 + D PO) Take by mouth.    . Cholecalciferol (D 2000) 2000 units TABS Take 1 tablet by mouth daily.    . flecainide (TAMBOCOR) 100 MG tablet TAKE 1 TABLET TWICE A DAY 180 tablet 3  . FLUoxetine (PROZAC) 40 MG capsule Take 1 capsule (40 mg total) by mouth 2 (two) times daily. 180 capsule 0  . Krill Oil 1000 MG CAPS Take 1 capsule by mouth daily.    [name] lactobacillus acidophilus (BACID) TABS tablet Take 1 tablet by mouth daily.    [name] levothyroxine (SYNTHROID, LEVOTHROID) 125 MCG tablet Take 1 tablet (125 mcg total) by mouth daily before breakfast. 90 tablet 2  . lovastatin (MEVACOR) 40 MG tablet Take 1 tablet (40 mg total) by mouth at bedtime. 90 tablet 3  . LUTEIN PO Take 1 capsule by mouth daily.    . magnesium 30 MG tablet Take 30 mg by mouth daily.    [name] omeprazole (PRILOSEC) 40 MG capsule Take 40 mg by mouth 2 (two) times daily.    . Potassium 75 MG TABS Take 1 tablet by mouth daily.    . rivaroxaban (XARELTO) 20 MG TABS tablet TAKE 1 TABLET DAILY WITH   SUPPER 21 tablet 0  . traZODone (DESYREL) 50 MG tablet Take 1 tablet (50 mg total) by mouth at bedtime. 90 tablet 3  . Turmeric 450 MG CAPS Take 1 capsule by mouth daily.     No current facility-administered medications for this visit.      Musculoskeletal: Strength & Muscle Tone: within normal limits Gait & Station: uses a cane Patient leans: N/A  Psychiatric Specialty Exam: Review of Systems  Constitutional: Negative.   HENT:  Negative.   Eyes: Negative.   Respiratory: Negative.   Cardiovascular: Negative.   Gastrointestinal: Negative.   Genitourinary: Negative.   Musculoskeletal: Negative.   Skin: Negative.   Neurological: Negative.   Endo/Heme/Allergies: Negative.   Psychiatric/Behavioral: Positive for depression.    Blood pressure 126/72, height 5\' 1"  (1.549 m), weight 190 lb  12.8 oz (86.5 kg).Body mass index is 36.05 kg/m.  General Appearance: Casual and Well Groomed  Eye Contact:  Good  Speech:  Clear and Coherent  Volume:  Normal  Mood:  Less depressed  Affect:  Full Range  Thought Process:  Goal Directed  Orientation:  Full (Time, Place, and Person)  Thought Content: Logical   Suicidal Thoughts:  No  Homicidal Thoughts:  No  Memory:  Immediate;   Good Recent;   Good Remote;   Good  Judgement:  Intact  Insight:  Good  Psychomotor Activity:  Normal  Concentration:  Concentration: Good  Recall:  Good  Fund of Knowledge: Good  Language: Good  Akathisia:  mild  Handed:  Right  AIMS (if indicated): not done  Assets:  Communication Skills Desire for Improvement Housing Resilience  ADL's:  Intact  Cognition: WNL  Sleep:  Fair   Screenings: Mini-Mental     Office Visit from 10/14/2018 in [facility] Office Visit from 08/13/2017 in [place] at [facility]  Total Score (max 30 points )  29  30    PHQ2-9     Clinical Support from 01/11/2018 in [facility] at [place] Visit from 01/22/2017 in [facility] [place] Visit from 02/24/2016 in [facility] at [place] Visit from 12/08/2015 in [facility] at [place]  PHQ-2 Total Score  0  0  0  2  PHQ-9 Total Score  -  -  -  5       Assessment and Plan: 68 yo married female witgh major depressive disorder. Mood started to improve after addition of 2 mg Abilify  to 80 mg of fluoxetine. Mild akathisia possibly present. She takes trazodone for sleep. We have decided to not change any medication doses at this time and give Abilify few more weeks to work. Grandson with autism will go to college next year so stress level should decrease.  Dx: MDD recurrent mild  Plan: Continue current meds uncharged. Return to clinic in 4 weeks.     Stephanie Acre, MD 10/22/2018, 2:40 PM

## 2018-10-23 DIAGNOSIS — K228 Other specified diseases of esophagus: Secondary | ICD-10-CM | POA: Diagnosis not present

## 2018-10-23 DIAGNOSIS — K2930 Chronic superficial gastritis without bleeding: Secondary | ICD-10-CM | POA: Diagnosis not present

## 2018-10-25 ENCOUNTER — Encounter (HOSPITAL_COMMUNITY)
Admission: RE | Admit: 2018-10-25 | Discharge: 2018-10-25 | Disposition: A | Discharge status: Home / Self Care | Payer: Medicare Other | Source: Ambulatory Visit | Attending: Gastroenterology | Admitting: Gastroenterology

## 2018-10-25 DIAGNOSIS — R112 Nausea with vomiting, unspecified: Secondary | ICD-10-CM | POA: Diagnosis not present

## 2018-10-25 DIAGNOSIS — K828 Other specified diseases of gallbladder: Secondary | ICD-10-CM | POA: Diagnosis not present

## 2018-10-25 MED ORDER — TECHNETIUM TC 99M MEBROFENIN IV KIT
5.4600 | PACK | Freq: Once | INTRAVENOUS | Status: AC | PRN
  Administered 2018-10-25: 5.46 via INTRAVENOUS

## 2018-10-29 DIAGNOSIS — Z4789 Encounter for other orthopedic aftercare: Secondary | ICD-10-CM | POA: Diagnosis not present

## 2018-11-05 DIAGNOSIS — G8928 Other chronic postprocedural pain: Secondary | ICD-10-CM | POA: Diagnosis not present

## 2018-11-05 DIAGNOSIS — R2689 Other abnormalities of gait and mobility: Secondary | ICD-10-CM | POA: Diagnosis not present

## 2018-11-05 DIAGNOSIS — M25561 Pain in right knee: Secondary | ICD-10-CM | POA: Diagnosis not present

## 2018-11-05 DIAGNOSIS — M25562 Pain in left knee: Secondary | ICD-10-CM | POA: Diagnosis not present

## 2018-11-07 DIAGNOSIS — S63642S Sprain of metacarpophalangeal joint of left thumb, sequela: Secondary | ICD-10-CM | POA: Diagnosis not present

## 2018-11-07 DIAGNOSIS — M79645 Pain in left finger(s): Secondary | ICD-10-CM | POA: Diagnosis not present

## 2018-11-07 DIAGNOSIS — M25542 Pain in joints of left hand: Secondary | ICD-10-CM | POA: Diagnosis not present

## 2018-11-11 ENCOUNTER — Other Ambulatory Visit (HOSPITAL_COMMUNITY): Payer: Self-pay | Admitting: Gastroenterology

## 2018-11-11 ENCOUNTER — Other Ambulatory Visit: Payer: Self-pay | Admitting: Gastroenterology

## 2018-11-11 ENCOUNTER — Other Ambulatory Visit: Payer: Self-pay | Admitting: Family Medicine

## 2018-11-11 DIAGNOSIS — I480 Paroxysmal atrial fibrillation: Secondary | ICD-10-CM

## 2018-11-11 DIAGNOSIS — R142 Eructation: Secondary | ICD-10-CM | POA: Diagnosis not present

## 2018-11-11 DIAGNOSIS — R110 Nausea: Secondary | ICD-10-CM | POA: Diagnosis not present

## 2018-11-11 DIAGNOSIS — R140 Abdominal distension (gaseous): Secondary | ICD-10-CM

## 2018-11-11 DIAGNOSIS — R1010 Upper abdominal pain, unspecified: Secondary | ICD-10-CM | POA: Diagnosis not present

## 2018-11-11 DIAGNOSIS — R1310 Dysphagia, unspecified: Secondary | ICD-10-CM | POA: Diagnosis not present

## 2018-11-12 ENCOUNTER — Encounter (HOSPITAL_COMMUNITY): Payer: Self-pay | Admitting: Psychiatry

## 2018-11-12 ENCOUNTER — Ambulatory Visit (INDEPENDENT_AMBULATORY_CARE_PROVIDER_SITE_OTHER): Payer: Medicare Other | Admitting: Psychiatry

## 2018-11-12 VITALS — BP 125/75 | HR 55 | Ht 61.0 in | Wt 191.0 lb

## 2018-11-12 DIAGNOSIS — F330 Major depressive disorder, recurrent, mild: Secondary | ICD-10-CM

## 2018-11-12 DIAGNOSIS — F411 Generalized anxiety disorder: Secondary | ICD-10-CM

## 2018-11-12 DIAGNOSIS — G4700 Insomnia, unspecified: Secondary | ICD-10-CM

## 2018-11-12 DIAGNOSIS — F320 Major depressive disorder, single episode, mild: Secondary | ICD-10-CM | POA: Diagnosis not present

## 2018-11-12 MED ORDER — FLUOXETINE HCL 40 MG PO CAPS: 40.0000 mg | ORAL_CAPSULE | Freq: Two times a day (BID) | ORAL | 0 refills | Status: DC

## 2018-11-12 MED ORDER — ZIPRASIDONE HCL 20 MG PO CAPS: 20.0000 mg | ORAL_CAPSULE | Freq: Two times a day (BID) | ORAL | 0 refills | Status: DC

## 2018-11-12 NOTE — Progress Notes (Signed)
BH MD/PA/NP OP Progress Note  11/12/2018 10:34 AM Lisa Acosta  MRN:  [id number]  Chief Complaint: Depressed mood HPI: 68 yo married female with recurrent depression; current episode related to family stress (grandson with autism, family members frustrated with his behaviors). Abilify was added to already prescribed fluoxetine (80 mg). It helped with anxiety/depression but Laquana noticed that it caused increase in appetite and she also has frequent headaches (both likely adverse effects of aripiprazole). No SI, no hopelessness but she is now quite concerbed about risk of weight gain. Visit Diagnosis:    ICD-10-CM   1. Mild episode of recurrent major depressive disorder ([place]) F33.0   2. Generalized anxiety disorder F41.1     Past Psychiatric History: please refer to original H&P  Past Medical History:  Past Medical History:  Diagnosis Date  . Atrial fibrillation ([place])   . Depression   . Diverticulitis   . Diverticulosis   . Esophageal ulcer   . Gait abnormality 10/14/2018  . GERD (gastroesophageal reflux disease)   . Hyperlipidemia   . Hypertension   . Hypothyroid   . OSA (obstructive sleep apnea) 08/31/2016  . Osteopenia   . 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 BREAST BIOPSY Left    needle core biopsy, benign  . CATARACT EXTRACTION    . HAND SURGERY    . KNEE SURGERY    . ROTATOR CUFF REPAIR    . TOTAL ABDOMINAL HYSTERECTOMY      Family Psychiatric History: reviwed  Family History:  Family History  Problem Relation Age of Onset  . Atrial fibrillation Mother   . Congestive Heart Failure Mother   . Depression Mother   . Heart disease Father   . Alcohol abuse Father   . Heart disease Brother   . Heart disease Brother   . Depression Sister   . Depression Daughter     Social History:  Social History   Socioeconomic History  . Marital status: Married    Spouse name: Jenny Reichmann  . Number of children: 1  .  Years of education: College  . Highest education level: Not on file  Occupational History  . Occupation: Conservator, museum/gallery: OTHER    Comment: [place]  . Financial resource strain: Not hard at all  . Food insecurity:    Worry: Never true    Inability: Never true  . Transportation needs:    Medical: No    Non-medical: No  Tobacco Use  . Smoking status: Never Smoker  . Smokeless tobacco: Never Used  Substance and Sexual Activity  . Alcohol use: Yes    Comment: rare  . Drug use: No  . Sexual activity: Not Currently  Lifestyle  . Physical activity:    Days per week: 0 days    Minutes per session: 0 min  . Stress: Rather much  Relationships  . Social connections:    Talks on phone: Not on file    Gets together: Not on file    Attends religious service: More than 4 times per year    Active member of club or organization: No    Attends meetings of clubs or organizations: Never    Relationship status: Married  Other Topics Concern  . Not on file  Social History Narrative   Patient lives at home spouse.   Caffeine use: 2 sodas weekly   Right handed  Allergies:  Allergies  Allergen Reactions  . Dextran Anaphylaxis  . Dextrans   . Dextromethorphan Hbr Other (See Comments)    hallucinations  . Tree Extract   . Penicillin G Rash  . Penicillins Rash  . Sulfa Antibiotics Rash  . Sulfacetamide Sodium Rash    Metabolic Disorder Labs: Lab Results  Component Value Date   HGBA1C 5.8 04/15/2018   No results found for: PROLACTIN Lab Results  Component Value Date   CHOL 203 (H) 04/15/2018   TRIG 192.0 (H) 04/15/2018   HDL 54.50 04/15/2018   CHOLHDL 4 04/15/2018   VLDL 38.4 04/15/2018   LDLCALC 110 (H) 04/15/2018   Lab Results  Component Value Date   TSH 1.71 04/15/2018   TSH 1.81 08/13/2017    Therapeutic Level Labs: No results found for: LITHIUM No results found for: VALPROATE No components found for:  CBMZ  Current  Medications: Current Outpatient Medications  Medication Sig Dispense Refill  . amLODipine (NORVASC) 5 MG tablet Take 1 tablet (5 mg total) by mouth daily. <PLEASE MAKE APPOINTMENT FOR REFILLS> 30 tablet 0  . ARIPiprazole (ABILIFY) 2 MG tablet Take 1 tablet (2 mg total) by mouth daily for 30 days. 30 tablet 0  . atenolol (TENORMIN) 50 MG tablet TAKE 1 TABLET DAILY 90 tablet 1  . BIOTIN PO Take 1 tablet by mouth daily.    . Calcium Carb-Cholecalciferol (CALCIUM 600 + D PO) Take by mouth.    . Cholecalciferol (D 2000) 2000 units TABS Take 1 tablet by mouth daily.    . flecainide (TAMBOCOR) 100 MG tablet TAKE 1 TABLET TWICE A DAY 180 tablet 3  . FLUoxetine (PROZAC) 40 MG capsule Take 1 capsule (40 mg total) by mouth 2 (two) times daily. 180 capsule 0  . Krill Oil 1000 MG CAPS Take 1 capsule by mouth daily.    [name] lactobacillus acidophilus (BACID) TABS tablet Take 1 tablet by mouth daily.    [name] levothyroxine (SYNTHROID, LEVOTHROID) 125 MCG tablet Take 1 tablet (125 mcg total) by mouth daily before breakfast. 90 tablet 2  . lovastatin (MEVACOR) 40 MG tablet Take 1 tablet (40 mg total) by mouth at bedtime. 90 tablet 3  . LUTEIN PO Take 1 capsule by mouth daily.    . magnesium 30 MG tablet Take 30 mg by mouth daily.    [name] omeprazole (PRILOSEC) 40 MG capsule Take 40 mg by mouth 2 (two) times daily.    . Potassium 75 MG TABS Take 1 tablet by mouth daily.    . traZODone (DESYREL) 50 MG tablet Take 1 tablet (50 mg total) by mouth at bedtime. 90 tablet 3  . Turmeric 450 MG CAPS Take 1 capsule by mouth daily.    [name] 20 MG TABS tablet TAKE 1 TABLET DAILY WITH   SUPPER 90 tablet 3   No current facility-administered medications for this visit.      Musculoskeletal: Strength & Muscle Tone: within normal limits Gait & Station: normal, uses cane Patient leans: N/A  Psychiatric Specialty Exam: Review of Systems  Constitutional: Negative.   HENT: Negative.   Eyes: Negative.   Respiratory: Negative.    Cardiovascular: Negative.   Gastrointestinal: Positive for abdominal pain and nausea.  Skin: Negative.   Neurological: Positive for headaches.  Endo/Heme/Allergies: Negative.   Psychiatric/Behavioral: The patient is nervous/anxious.     There were no vitals taken for this visit.There is no height or weight on file to calculate BMI.  General Appearance: Well Groomed  Eye Contact:  Good  Speech:  Clear and Coherent  Volume:  Normal  Mood:  Anxious  Affect:  Full Range  Thought Process:  Goal Directed  Orientation:  Full (Time, Place, and Person)  Thought Content: Logical   Suicidal Thoughts:  No  Homicidal Thoughts:  No  Memory:  Immediate;   Good Recent;   Good Remote;   Good  Judgement:  Intact  Insight:  Fair  Psychomotor Activity:  Normal  Concentration:  Concentration: Good  Recall:  Good  Fund of Knowledge: Good  Language: Good  Akathisia:  Negative  Handed:  Right  AIMS (if indicated): not done  Assets:  Communication Skills Desire for Improvement Housing Resilience  ADL's:  Intact  Cognition: WNL  Sleep:  Good   Screenings: Mini-Mental     Office Visit from 10/14/2018 in [facility] Office Visit from 08/13/2017 in [place] at [facility]  Total Score (max 30 points )  29  30    PHQ2-9     Clinical Support from 01/11/2018 in [facility] at [place] Visit from 01/22/2017 in [place] at [place] Visit from 02/24/2016 in [place] at [place] Visit from 12/08/2015 in [place] at [place]  PHQ-2 Total Score  0  0  0  2  PHQ-9 Total Score  -  -  -  5       Assessment and Plan: 68 yo female with mild MDD and generalized anxiety. She started to experience headaches on aripiprazole as well as noticed marked increase in appetite. Sleep fair with trazodone. No hopelessness, no SI.   She is also on fluoxetine  80 mg (taken 40 mg bid). We will dc Abilify and instead start low dose of ziprasidone (20 mg bid) for augmentation. I recommended to try increasing dose of trazodone to 75-100 mg as needed for sleep as she sometimes has difficulty maintaining sleep. Mileidy will return to clinic in 4 weeks. If she tolerates Geodon well and has at least similar improvement in mood as she did with Abilify we will stretch next appointments to q 3 months.   Stephanie Acre, MD 11/12/2018, 10:34 AM

## 2018-11-18 ENCOUNTER — Encounter: Payer: Self-pay | Admitting: Neurology

## 2018-11-18 ENCOUNTER — Ambulatory Visit (INDEPENDENT_AMBULATORY_CARE_PROVIDER_SITE_OTHER): Payer: Medicare Other | Admitting: Neurology

## 2018-11-18 ENCOUNTER — Telehealth: Payer: Self-pay | Admitting: Neurology

## 2018-11-18 DIAGNOSIS — Z0289 Encounter for other administrative examinations: Secondary | ICD-10-CM

## 2018-11-18 DIAGNOSIS — F0781 Postconcussional syndrome: Secondary | ICD-10-CM

## 2018-11-18 DIAGNOSIS — M542 Cervicalgia: Secondary | ICD-10-CM | POA: Diagnosis not present

## 2018-11-18 DIAGNOSIS — G8929 Other chronic pain: Secondary | ICD-10-CM

## 2018-11-18 DIAGNOSIS — R269 Unspecified abnormalities of gait and mobility: Secondary | ICD-10-CM

## 2018-11-18 NOTE — Telephone Encounter (Signed)
Medicare/mutual of [place] order sent to [facility]. No auth they will reach out to the pt to schedule.  °

## 2018-11-18 NOTE — Progress Notes (Addendum)
The patient came in today for EMG nerve conduction study which is unremarkable, no evidence of a neuropathy, no source of the gait instability was noted.  The patient could potentially have a postconcussive vestibulopathy, but she is reporting a lot of neck discomfort, we will go and get a MRI of the cervical spine to look for another etiology of her gait disorder.  The patient has not had any falls since last seen.        [place]    Nerve / Sites Muscle Latency Ref. Amplitude Ref. Rel Amp Segments Distance Velocity Ref. Area    ms ms mV mV %  cm m/s m/s mVms  R Peroneal - EDB     Ankle EDB 4.4 ?6.5 3.7 ?2.0 100 Ankle - EDB 9   15.0     Fib head EDB 9.7  3.1  83.9 Fib head - Ankle 24 45 ?44 12.5     Pop fossa EDB 12.0  3.0  97.2 Pop fossa - Fib head 10 44 ?44 12.2         Pop fossa - Ankle      L Peroneal - EDB     Ankle EDB 5.3 ?6.5 3.1 ?2.0 100 Ankle - EDB 9   10.2     Fib head EDB 11.0  2.7  85.3 Fib head - Ankle 27 47 ?44 10.2     Pop fossa EDB 13.3  2.2  82.8 Pop fossa - Fib head 10 45 ?44 9.1         Pop fossa - Ankle      R Tibial - AH     Ankle AH 3.9 ?5.8 5.8 ?4.0 100 Ankle - AH 9   13.5     Pop fossa AH 11.5  5.7  99.8 Pop fossa - Ankle 31 41 ?41 13.0  L Tibial - AH     Ankle AH 3.7 ?5.8 7.0 ?4.0 100 Ankle - AH 9   15.7     Pop fossa AH 11.6  4.4  63.5 Pop fossa - Ankle 32 41 ?41 13.9             SNC    Nerve / Sites Rec. Site Peak Lat Ref.  Amp Ref. Segments Distance    ms ms V V  cm  R Sural - Ankle (Calf)     Calf Ankle 2.8 ?4.4 6 ?6 Calf - Ankle 14  L Sural - Ankle (Calf)     Calf Ankle 3.1 ?4.4 6 ?6 Calf - Ankle 14  R Superficial peroneal - Ankle     Lat leg Ankle 3.7 ?4.4 6 ?6 Lat leg - Ankle 14  L Superficial peroneal - Ankle     Lat leg Ankle 4.0 ?4.4 4 ?6 Lat leg - Ankle 14              F  Wave    Nerve F Lat Ref.   ms ms  R Tibial - AH 43.1 ?56.0  L Tibial - AH 48.8 ?56.0

## 2018-11-18 NOTE — Progress Notes (Signed)
Please refer to EMG and nerve conduction procedure note.  

## 2018-11-18 NOTE — Procedures (Signed)
     HISTORY:  Lisa Acosta is a 68 year old patient with a history of a gait disturbance with multiple falls, the patient reports chronic neck discomfort as well without radiation down the arms.  She is being evaluated for an etiology for her gait instability.  NERVE CONDUCTION STUDIES:  Nerve conduction studies were performed on both lower extremities. The distal motor latencies and motor amplitudes for the peroneal and posterior tibial nerves were within normal limits. The nerve conduction velocities for these nerves were also normal. The sensory latencies for the peroneal and sural nerves were within normal limits. The F wave latencies for the posterior tibial nerves were within normal limits.   EMG STUDIES:  EMG study was performed on the left lower extremity:  The tibialis anterior muscle reveals 2 to 4K motor units with full recruitment. No fibrillations or positive waves were seen. The peroneus tertius muscle reveals 2 to 4K motor units with full recruitment. No fibrillations or positive waves were seen. The medial gastrocnemius muscle reveals 1 to 3K motor units with full recruitment. No fibrillations or positive waves were seen. The vastus lateralis muscle reveals 2 to 4K motor units with full recruitment. No fibrillations or positive waves were seen. The iliopsoas muscle reveals 2 to 4K motor units with full recruitment. No fibrillations or positive waves were seen. The biceps femoris muscle (long head) reveals 2 to 4K motor units with full recruitment. No fibrillations or positive waves were seen. The lumbosacral paraspinal muscles were tested at 3 levels, and revealed no abnormalities of insertional activity at all 3 levels tested. There was good relaxation.   IMPRESSION:  Nerve conduction studies done on both lower extremities were unremarkable, no evidence of a peripheral neuropathy was seen.  EMG evaluation of the left lower extremity was unremarkable, no evidence of an  overlying lumbosacral radiculopathy was noted.  Jill Alexanders MD 11/18/2018 1:41 PM  [facility] [address] [place], [place] [address]  Phone [phone] Fax [phone]

## 2018-11-19 ENCOUNTER — Ambulatory Visit (HOSPITAL_COMMUNITY)
Admission: RE | Admit: 2018-11-19 | Discharge: 2018-11-19 | Disposition: A | Discharge status: Home / Self Care | Payer: Medicare Other | Source: Ambulatory Visit | Attending: Gastroenterology | Admitting: Gastroenterology

## 2018-11-19 DIAGNOSIS — R142 Eructation: Secondary | ICD-10-CM

## 2018-11-19 DIAGNOSIS — R110 Nausea: Secondary | ICD-10-CM | POA: Insufficient documentation

## 2018-11-19 DIAGNOSIS — R6881 Early satiety: Secondary | ICD-10-CM | POA: Diagnosis not present

## 2018-11-19 DIAGNOSIS — R112 Nausea with vomiting, unspecified: Secondary | ICD-10-CM | POA: Diagnosis not present

## 2018-11-19 DIAGNOSIS — R1010 Upper abdominal pain, unspecified: Secondary | ICD-10-CM | POA: Diagnosis not present

## 2018-11-19 DIAGNOSIS — R140 Abdominal distension (gaseous): Secondary | ICD-10-CM

## 2018-11-19 MED ORDER — TECHNETIUM TC 99M SULFUR COLLOID
2.0000 | Freq: Once | INTRAVENOUS | Status: AC | PRN
  Administered 2018-11-19: 2 via INTRAVENOUS

## 2018-11-21 ENCOUNTER — Other Ambulatory Visit: Payer: Self-pay

## 2018-11-21 ENCOUNTER — Ambulatory Visit
Admission: RE | Admit: 2018-11-21 | Discharge: 2018-11-21 | Disposition: A | Discharge status: Home / Self Care | Payer: Medicare Other | Source: Ambulatory Visit | Attending: Neurology | Admitting: Neurology

## 2018-11-21 ENCOUNTER — Telehealth: Payer: Self-pay | Admitting: Neurology

## 2018-11-21 DIAGNOSIS — M542 Cervicalgia: Secondary | ICD-10-CM | POA: Diagnosis not present

## 2018-11-21 DIAGNOSIS — R269 Unspecified abnormalities of gait and mobility: Secondary | ICD-10-CM | POA: Diagnosis not present

## 2018-11-21 DIAGNOSIS — G8929 Other chronic pain: Secondary | ICD-10-CM

## 2018-11-21 NOTE — Telephone Encounter (Signed)
   I called the patient.  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  The patient could potentially have a postconcussive vestibulopathy, she has had 5 months of physical therapy with minimal benefit.  She is using a cane for ambulation.  I will see her in early June for reevaluation.  There does not appear to be a treatable cause of her gait disorder found.  MRI cervical 11/21/18:  IMPRESSION: Abnormal MRI scan cervical spine showing mild spondylitic changes throughout most prominent at C5-6 and C6-7 where there is mild canal narrowing but without definite compression.

## 2018-12-02 ENCOUNTER — Encounter (HOSPITAL_COMMUNITY): Admission: RE | Payer: Self-pay | Source: Home / Self Care

## 2018-12-02 ENCOUNTER — Ambulatory Visit (HOSPITAL_COMMUNITY): Admission: RE | Admit: 2018-12-02 | Payer: Medicare Other | Source: Home / Self Care | Admitting: Gastroenterology

## 2018-12-02 SURGERY — MANOMETRY, ESOPHAGUS

## 2018-12-09 ENCOUNTER — Other Ambulatory Visit: Payer: Self-pay | Admitting: Family Medicine

## 2018-12-09 DIAGNOSIS — G4700 Insomnia, unspecified: Secondary | ICD-10-CM

## 2018-12-11 ENCOUNTER — Other Ambulatory Visit: Payer: Self-pay

## 2018-12-11 ENCOUNTER — Ambulatory Visit (INDEPENDENT_AMBULATORY_CARE_PROVIDER_SITE_OTHER): Payer: Medicare Other | Admitting: Psychiatry

## 2018-12-11 DIAGNOSIS — F330 Major depressive disorder, recurrent, mild: Secondary | ICD-10-CM | POA: Insufficient documentation

## 2018-12-11 DIAGNOSIS — G4700 Insomnia, unspecified: Secondary | ICD-10-CM

## 2018-12-11 DIAGNOSIS — F411 Generalized anxiety disorder: Secondary | ICD-10-CM

## 2018-12-11 MED ORDER — BREXPIPRAZOLE 0.5 MG PO TABS: 0.5000 mg | ORAL_TABLET | Freq: Every day | ORAL | 0 refills | Status: DC

## 2018-12-11 MED ORDER — TRAZODONE HCL 50 MG PO TABS: 75.0000 mg | ORAL_TABLET | Freq: Every day | ORAL | 0 refills | Status: DC

## 2018-12-11 NOTE — Progress Notes (Signed)
BH MD/PA/NP OP Progress Note  12/11/2018 10:24 AM Aldan  MRN:  [id number]  Interview was conducted using teleconferencing and I verified that I was speaking with the correct person using two identifiers. I discussed the limitations of evaluation and management by telemedicine and  the availability of in person appointments. Patient expressed understanding and agreed to proceed.  Chief Complaint: Some anxiety and depression.  HPI: 68 yo female with mild MDD and generalized anxiety. We had added aripiorazole to her fluoxetine but although her mood improved she started to experience headaches on aripiprazole as well as noticed marked increase in appetite. Sleep fair with increased dose of trazodone (75 mg). No hopelessness, no SI.  We tried low dose of ziprasidone (20 mg bid) for augmentation instead of aripiprazole but again she reported having some adverse effects (eye dryness) which started after she took that medication and stopped once she stopped taking it. She remains more anxious than depressed and worries about potential effects of new meds yet wants to try something with Prozac so her anxiety/depression fully resolves.   Visit Diagnosis:    ICD-10-CM   1. Generalized anxiety disorder F41.1   2. 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  Past Medical History:  Past Medical History:  Diagnosis Date  . Atrial fibrillation ([place])   . Depression   . Diverticulitis   . Diverticulosis   . Esophageal ulcer   . Gait abnormality 10/14/2018  . GERD (gastroesophageal reflux disease)   . Hyperlipidemia   . Hypertension   . Hypothyroid   . OSA (obstructive sleep apnea) 08/31/2016  . Osteopenia   . 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 BREAST BIOPSY Left    needle core biopsy, benign  . CATARACT EXTRACTION    . HAND SURGERY    . KNEE  SURGERY    . ROTATOR CUFF REPAIR    . TOTAL ABDOMINAL HYSTERECTOMY      Family Psychiatric History: Reviewed  Family History:  Family History  Problem Relation Age of Onset  . Atrial fibrillation Mother   . Congestive Heart Failure Mother   . Depression Mother   . Heart disease Father   . Alcohol abuse Father   . Heart disease Brother   . Heart disease Brother   . Depression Sister   . Depression Daughter     Social History:  Social History   Socioeconomic History  . Marital status: Married    Spouse name: Jenny Reichmann  . Number of children: 1  . Years of education: College  . Highest education level: Not on file  Occupational History  . Occupation: Conservator, museum/gallery: OTHER    Comment: [place]  . Financial resource strain: Not hard at all  . Food insecurity:    Worry: Never true    Inability: Never true  . Transportation needs:    Medical: No    Non-medical: No  Tobacco Use  . Smoking status: Never Smoker  . Smokeless tobacco: Never Used  Substance and Sexual Activity  . Alcohol use: Yes    Comment: rare  . Drug use: No  . Sexual activity: Not Currently  Lifestyle  . Physical activity:    Days per week: 0 days    Minutes per session: 0 min  . Stress: Rather much  Relationships  .  Social connections:    Talks on phone: Not on file    Gets together: Not on file    Attends religious service: More than 4 times per year    Active member of club or organization: No    Attends meetings of clubs or organizations: Never    Relationship status: Married  Other Topics Concern  . Not on file  Social History Narrative   Patient lives at home spouse.   Caffeine use: 2 sodas weekly   Right handed     Allergies:  Allergies  Allergen Reactions  . Dextran Anaphylaxis  . Dextrans   . Dextromethorphan Hbr Other (See Comments)    hallucinations  . Tree Extract   . Penicillin G Rash  . Penicillins Rash  . Sulfa Antibiotics Rash   . Sulfacetamide Sodium Rash    Metabolic Disorder Labs: Lab Results  Component Value Date   HGBA1C 5.8 04/15/2018   No results found for: PROLACTIN Lab Results  Component Value Date   CHOL 203 (H) 04/15/2018   TRIG 192.0 (H) 04/15/2018   HDL 54.50 04/15/2018   CHOLHDL 4 04/15/2018   VLDL 38.4 04/15/2018   LDLCALC 110 (H) 04/15/2018   Lab Results  Component Value Date   TSH 1.71 04/15/2018   TSH 1.81 08/13/2017    Therapeutic Level Labs: No results found for: LITHIUM No results found for: VALPROATE No components found for:  CBMZ  Current Medications: Current Outpatient Medications  Medication Sig Dispense Refill  . amLODipine (NORVASC) 5 MG tablet Take 1 tablet (5 mg total) by mouth daily. <PLEASE MAKE APPOINTMENT FOR REFILLS> 30 tablet 0  . atenolol (TENORMIN) 50 MG tablet TAKE 1 TABLET DAILY 90 tablet 1  . BIOTIN PO Take 1 tablet by mouth daily.    . Calcium Carb-Cholecalciferol (CALCIUM 600 + D PO) Take by mouth.    . Cholecalciferol (D 2000) 2000 units TABS Take 1 tablet by mouth daily.    . flecainide (TAMBOCOR) 100 MG tablet TAKE 1 TABLET TWICE A DAY 180 tablet 3  . FLUoxetine (PROZAC) 40 MG capsule Take 1 capsule (40 mg total) by mouth 2 (two) times daily. 180 capsule 0  . Krill Oil 1000 MG CAPS Take 1 capsule by mouth daily.    [name] lactobacillus acidophilus (BACID) TABS tablet Take 1 tablet by mouth daily.    [name] levothyroxine (SYNTHROID, LEVOTHROID) 125 MCG tablet Take 1 tablet (125 mcg total) by mouth daily before breakfast. 90 tablet 2  . lovastatin (MEVACOR) 40 MG tablet Take 1 tablet (40 mg total) by mouth at bedtime. 90 tablet 3  . LUTEIN PO Take 1 capsule by mouth daily.    . magnesium 30 MG tablet Take 30 mg by mouth daily.    [name] omeprazole (PRILOSEC) 40 MG capsule Take 40 mg by mouth 2 (two) times daily.    . Potassium 75 MG TABS Take 1 tablet by mouth daily.    . traZODone (DESYREL) 50 MG tablet TAKE 1 TABLET AT BEDTIME 90 tablet 3  . Turmeric 450 MG CAPS  Take 1 capsule by mouth daily.    [name] 20 MG TABS tablet TAKE 1 TABLET DAILY WITH   SUPPER 90 tablet 3  . ziprasidone (GEODON) 20 MG capsule Take 1 capsule (20 mg total) by mouth 2 (two) times daily with a meal for 30 days. 60 capsule 0   No current facility-administered medications for this visit.       Psychiatric Specialty Exam: Review of  Systems  Musculoskeletal: Positive for neck pain.  Psychiatric/Behavioral: Positive for depression. The patient is nervous/anxious.   All other systems reviewed and are negative.   There were no vitals taken for this visit.There is no height or weight on file to calculate BMI.  General Appearance: NA  Eye Contact:  NA  Speech:  Clear and Coherent  Volume:  Normal  Mood:  Anxious  Affect:  NA  Thought Process:  Goal Directed  Orientation:  Full (Time, Place, and Person)  Thought Content: Logical   Suicidal Thoughts:  No  Homicidal Thoughts:  No  Memory:  Immediate;   Good Recent;   Good Remote;   Good  Judgement:  Fair  Insight:  Fair  Psychomotor Activity:  NA  Concentration:  Concentration: Good  Recall:  Good  Fund of Knowledge: Good  Language: Good  Akathisia:  Negative  Handed:  Right  AIMS (if indicated): not done  Assets:  Communication Skills Desire for Improvement Financial Resources/Insurance Housing  ADL's:  Intact  Cognition: WNL  Sleep:  Fair   Screenings: Mini-Mental     Office Visit from 10/14/2018 in [facility] Office Visit from 08/13/2017 in [place] at [facility]  Total Score (max 30 points )  29  30    PHQ2-9     Clinical Support from 01/11/2018 in [facility] at [place] Visit from 01/22/2017 in [place] at [place] Visit from 02/24/2016 in [place] at [place] Visit from 12/08/2015 in [place] at [place]   PHQ-2 Total Score  0  0  0  2  PHQ-9 Total Score  -  -  -  5       Assessment and Plan: 68 yo female with mild MDD and generalized anxiety. We had added aripiorazole to her fluoxetine but although her mood improved she started to experience headaches on aripiprazole as well as noticed marked increase in appetite. Sleep fair with increased dose of trazodone (75 mg). No hopelessness, no SI.  We tried low dose of ziprasidone (20 mg bid) for augmentation instead of aripiprazole but again she reported having some adverse effects (eye dryness) which started after she took that medication and stopped once she stopped taking it. She remains more anxious than depressed and worries about potential effects of new meds yet wants to try something with Prozac so her anxiety/depression fully resolves. We will try Rexulti low dose 0.5 mg together with morning dose of Prozac (she takes it 40 mg bid). We may increase dose to 1-2 mg next if she tolerates it. Predictably she already is worrying about possible adverse effects. Next med management appointment on May 1st.    Stephanie Acre, MD 12/11/2018, 10:24 AM

## 2019-01-10 ENCOUNTER — Ambulatory Visit (HOSPITAL_COMMUNITY): Payer: Medicare Other | Admitting: Psychiatry

## 2019-01-23 ENCOUNTER — Telehealth: Payer: Self-pay | Admitting: Cardiology

## 2019-01-23 ENCOUNTER — Other Ambulatory Visit: Payer: Self-pay | Admitting: Cardiology

## 2019-01-23 MED ORDER — LOVASTATIN 40 MG PO TABS: 40.0000 mg | ORAL_TABLET | Freq: Every day | ORAL | 5 refills | Status: DC

## 2019-01-23 NOTE — Telephone Encounter (Signed)
I called pt to discuss her complaints. She says she felt her heart fluttering last night and felt that she was in afib. She had not had any afib over the last year and a half since starting flecainide. Her only other symptom was some mild shakinesss. She says she is back in normal rhythm now with BP of 115. Feeling well. She wanted to know what she should do if goes into afib. She is anticoagulated with Xarelto. She is taking her flecainide and atenolol. I advised that as long as she is not experiencing much discomfort or shortness of breath it is OK to have short bouts of afib. If she is having symptoms or HR greater than 100 she can take an extra 1/2 - 1 dose of atneolol as long as BP >100. If symptoms do no improve she should call our office.  She was grateful for the call and felt reassured.

## 2019-01-23 NOTE — Telephone Encounter (Signed)
New Message  Patient c/o Palpitations:  High priority if patient c/o lightheadedness, shortness of breath, or chest pain  How long have you had palpitations/irregular HR/ Afib? Are you having the symptoms now?  Started Yesterday morning  1) Are you currently experiencing lightheadedness, SOB or CP? NO  2) Do you have a history of afib (atrial fibrillation) or irregular heart rhythm? Yes  3) Have you checked your BP or HR? (document readings if available): No patient has to get a monitor.  4) Are you experiencing any other symptoms? No

## 2019-01-23 NOTE — Telephone Encounter (Signed)
Called patient to inquire about her irregular heart beat. Patient has a history of A-fib and is on Flecainide and is taking it as directed. She couldn't find her blood pressure cuff but her daughter is going to the store to get another one to take the patients blood pressure. Patient also reported feeling "shaky". Will route message to Harle [facility] for recommendations.

## 2019-01-23 NOTE — Telephone Encounter (Signed)
New Message    *STAT* If patient is at the pharmacy, call can be transferred to refill team.   1. Which medications need to be refilled? (please list name of each medication and dose if known) Lovastatin 40mg   2. Which pharmacy/location (including street and city if local pharmacy) is medication to be sent to? [facility] on [address] in [place]  3. Do they need a 30 day or 90 day supply? 30 day supply

## 2019-01-30 ENCOUNTER — Other Ambulatory Visit: Payer: Self-pay | Admitting: Cardiology

## 2019-01-30 DIAGNOSIS — I480 Paroxysmal atrial fibrillation: Secondary | ICD-10-CM

## 2019-02-10 ENCOUNTER — Telehealth: Payer: Self-pay | Admitting: Neurology

## 2019-02-10 ENCOUNTER — Other Ambulatory Visit: Payer: Self-pay | Admitting: Family Medicine

## 2019-02-10 DIAGNOSIS — R002 Palpitations: Secondary | ICD-10-CM

## 2019-02-10 NOTE — Telephone Encounter (Signed)
Pt gave consent to a Virtual Visit. Link sent to [phone] [employer]  Pt understands that although there may be some limitations with this type of visit, we will take all precautions to reduce any security or privacy concerns.  Pt understands that this will be treated like an in office visit and we will file with pt's insurance, and there may be a patient responsible charge related to this service.

## 2019-02-11 NOTE — Telephone Encounter (Signed)
Left for pt to call back so we could complete pre charting for 02/12/19 virtual visit.

## 2019-02-12 ENCOUNTER — Ambulatory Visit (INDEPENDENT_AMBULATORY_CARE_PROVIDER_SITE_OTHER): Payer: Medicare Other | Admitting: Neurology

## 2019-02-12 ENCOUNTER — Encounter: Payer: Self-pay | Admitting: Neurology

## 2019-02-12 ENCOUNTER — Other Ambulatory Visit: Payer: Self-pay

## 2019-02-12 DIAGNOSIS — R269 Unspecified abnormalities of gait and mobility: Secondary | ICD-10-CM | POA: Diagnosis not present

## 2019-02-12 NOTE — Progress Notes (Signed)
     Virtual Visit via Video Note  I connected with Lisa Acosta on 02/12/19 at 11:30 AM EDT by a video enabled telemedicine application and verified that I am speaking with the correct person using two identifiers.  Location: Patient: The patient is at home. Provider: Physician in office.   I discussed the limitations of evaluation and management by telemedicine and the availability of in person appointments. The patient expressed understanding and agreed to proceed.  History of Present Illness: Lisa Acosta is a 68 year old white female with a history of a gait disorder.  The patient sustained several falls, all work-up is been relatively unremarkable.  She has had CT scan of the brain that shows minimal white matter changes, MRI of the cervical spine did not show evidence of spinal cord involvement.  She had blood work done and nerve conductions done on both legs, no evidence of a peripheral neuropathy was seen.  The patient is now using a cane most the time with walking, she has not sustained any further falls but she still feels imbalanced.  She is being reevaluated for the walking problem.  She has gone through 5 months of physical therapy without any benefit.   Observations/Objective: On the video evaluation, the patient is alert and cooperative.  She has full extraocular movements.  Face is symmetric, the patient is able protrude the tongue midline with good lateral movement the tongue.  Speech is well enunciated, not aphasic or dysarthric.  She has good finger-nose-finger and heel shin bilaterally.  Gait appears to be completely normal, the patient walks with good stride and good arm swing has good turns.  Assessment and Plan: 1.  Gait disorder, etiology unclear  The patient reports problems with gait instability and falls, her ability to walk without the cane appears to be normal.  The cause of her gait problem is not clear.  The patient has fallen and bumped her head, she  could have a low-grade postconcussive vestibulopathy.  I have recommended MRI of the brain, if the patient is amenable to this, she is to contact our office.  We lost connection several times during the evaluation, after the last episode, the patient was not able to reconnect, I called her telephone number and left a message.  Follow Up Instructions: 22-month follow-up with me.   I discussed the assessment and treatment plan with the patient. The patient was provided an opportunity to ask questions and all were answered. The patient agreed with the plan and demonstrated an understanding of the instructions.   The patient was advised to call back or seek an in-person evaluation if the symptoms worsen or if the condition fails to improve as anticipated.  I provided 25 minutes of non-face-to-face time during this encounter.   Kathrynn Ducking, MD

## 2019-02-14 ENCOUNTER — Ambulatory Visit: Payer: Self-pay

## 2019-02-14 ENCOUNTER — Telehealth (INDEPENDENT_AMBULATORY_CARE_PROVIDER_SITE_OTHER): Payer: Medicare Other | Admitting: Family

## 2019-02-14 DIAGNOSIS — M549 Dorsalgia, unspecified: Secondary | ICD-10-CM

## 2019-02-14 MED ORDER — METHOCARBAMOL 500 MG PO TABS: 500.0000 mg | ORAL_TABLET | Freq: Three times a day (TID) | ORAL | 0 refills | Status: DC | PRN

## 2019-02-14 MED ORDER — METHYLPREDNISOLONE 4 MG PO TBPK: ORAL_TABLET | ORAL | 0 refills | Status: DC

## 2019-02-14 NOTE — Progress Notes (Signed)
Virtual Visit via Video Note  I connected with Lisa Acosta on 02/14/19 at 11:00 AM EDT by a video enabled telemedicine application and verified that I am speaking with the correct person using two identifiers. This visit type was conducted due to national recommendations for restrictions regarding the COVID-19 Pandemic (e.g. social distancing).  This format is felt to be most appropriate for this patient at this time.   I discussed the limitations of evaluation and management by telemedicine and the availability of in person appointments. The patient expressed understanding and agreed to proceed.  Only the patient and myself were on today's video visit. The patient was at home and I was at home at the time of today's visit.   History of Present Illness:   Patient is a 68 yr old female who presents today with chief complaint of back pain. (Pt had an MRI of the C spine done in march which noted some spondylitic changes.  Patient reports pain is in the center of her lower back. Reports that she that she vaccumed on Tuesday and the pain began on Wednesday evening. Reports that she has tried acetaminophen without improvement.  Reports that she rolls a "tiger tail" which does help  With her pain. Reports that the pain stays in the center of her back. Bending/standing up worsen the pain. Denies pain radiation of her pain.   Past Medical History:  Diagnosis Date  . Atrial fibrillation ([place])   . Depression   . Diverticulitis   . Diverticulosis   . Esophageal ulcer   . Gait abnormality 10/14/2018  . GERD (gastroesophageal reflux disease)   . Hyperlipidemia   . Hypertension   . Hypothyroid   . OSA (obstructive sleep apnea) 08/31/2016  . Osteopenia   . 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 Marital status: Married    Spouse name: Jenny Reichmann  . Number of children: 1  . Years of education: College  . Highest education level: Not on file   Occupational History  . Occupation: Conservator, museum/gallery: OTHER    Comment: [place]  . Financial resource strain: Not hard at all  . Food insecurity:    Worry: Never true    Inability: Never true  . Transportation needs:    Medical: No    Non-medical: No  Tobacco Use  . Smoking status: Never Smoker  . Smokeless tobacco: Never Used  Substance and Sexual Activity  . Alcohol use: Yes    Comment: rare  . Drug use: No  . Sexual activity: Not Currently  Lifestyle  . Physical activity:    Days per week: 0 days    Minutes per session: 0 min  . Stress: Rather much  Relationships  . Social connections:    Talks on phone: Not on file    Gets together: Not on file    Attends religious service: More than 4 times per year    Active member of club or organization: No    Attends meetings of clubs or organizations: Never    Relationship status: Married  . Intimate partner violence:    Fear of current or ex partner: No    Emotionally abused: No    Physically abused: No    Forced sexual activity: No  Other Topics Concern  . Not on file  Social History Narrative   Patient lives at home spouse.   Caffeine use: 2  sodas weekly   Right handed     Past Surgical History:  Procedure Laterality Date  . BREAST BIOPSY Left    needle core biopsy, benign  . CATARACT EXTRACTION    . HAND SURGERY    . KNEE SURGERY    . ROTATOR CUFF REPAIR    . TOTAL ABDOMINAL HYSTERECTOMY      Family History  Problem Relation Age of Onset  . Atrial fibrillation Mother   . Congestive Heart Failure Mother   . Depression Mother   . Heart disease Father   . Alcohol abuse Father   . Heart disease Brother   . Heart disease Brother   . Depression Sister   . Depression Daughter     Allergies  Allergen Reactions  . Dextran Anaphylaxis  . Dextrans   . Dextromethorphan Hbr Other (See Comments)    hallucinations  . Tree Extract   . Penicillin G Rash  . Penicillins  Rash  . Sulfa Antibiotics Rash  . Sulfacetamide Sodium Rash    Current Outpatient Medications on File Prior to Visit  Medication Sig Dispense Refill  . amLODipine (NORVASC) 5 MG tablet TAKE 1 TABLET DAILY 90 tablet 1  . atenolol (TENORMIN) 50 MG tablet TAKE 1 TABLET DAILY 90 tablet 1  . BIOTIN PO Take 1 tablet by mouth daily.    . Brexpiprazole (REXULTI) 0.5 MG TABS Take 1 tablet (0.5 mg total) by mouth daily. 30 tablet 0  . Calcium Carb-Cholecalciferol (CALCIUM 600 + D PO) Take by mouth.    . Cholecalciferol (D 2000) 2000 units TABS Take 1 tablet by mouth daily.    . flecainide (TAMBOCOR) 100 MG tablet TAKE 1 TABLET TWICE A DAY 180 tablet 3  . Krill Oil 1000 MG CAPS Take 1 capsule by mouth daily.    [name] lactobacillus acidophilus (BACID) TABS tablet Take 1 tablet by mouth daily.    [name] levothyroxine (SYNTHROID, LEVOTHROID) 125 MCG tablet Take 1 tablet (125 mcg total) by mouth daily before breakfast. 90 tablet 2  . lovastatin (MEVACOR) 40 MG tablet Take 1 tablet (40 mg total) by mouth at bedtime. 30 tablet 5  . LUTEIN PO Take 1 capsule by mouth daily.    . magnesium 30 MG tablet Take 30 mg by mouth daily.    [name] omeprazole (PRILOSEC) 40 MG capsule Take 40 mg by mouth 2 (two) times daily.    . Potassium 75 MG TABS Take 1 tablet by mouth daily.    . traZODone (DESYREL) 50 MG tablet Take 1.5 tablets (75 mg total) by mouth at bedtime. 135 tablet 0  . Turmeric 450 MG CAPS Take 1 capsule by mouth daily.    [name] 20 MG TABS tablet TAKE 1 TABLET DAILY WITH   SUPPER 90 tablet 3  . FLUoxetine (PROZAC) 40 MG capsule Take 1 capsule (40 mg total) by mouth 2 (two) times daily. 180 capsule 0   No current facility-administered medications on file prior to visit.     There were no vitals taken for this visit.    Observations/Objective:  Gen: Awake, alert, no acute distress Resp: Breathing is even and non-labored Psych: calm/pleasant demeanor Neuro: Alert and Oriented x 3, + facial symmetry, speech  is clear.  Assessment and Plan:  Back pain- new. Will rx with solumedrol and prn robaxin. She is advised to call if new/worsening symptoms or if symptoms fail to improve in 1 week. Discussed red flags which should prompt her to proceed to the ED  such as LE weakness, bowel/bladder incontinence.  Follow Up Instructions:    I discussed the assessment and treatment plan with the patient. The patient was provided an opportunity to ask questions and all were answered. The patient agreed with the plan and demonstrated an understanding of the instructions.   The patient was advised to call back or seek an in-person evaluation if the symptoms worsen or if the condition fails to improve as anticipated.    Nance Pear, NP

## 2019-02-14 NOTE — Telephone Encounter (Signed)
Returned call to patient who states that she was vacuuming on Tuesday and hurt her back.  She states that she has pulled muscles vacuuming before.  She states that the pain is really bad and she is unable to bend or move after getting out of bed. She states that she has had to take muscle relaxer's in the past for this type or injury. Care advice read to patient. Patient verbalized understanding of all instructions. Call transferred to office for scheduling.   Reason for Disposition . [1] SEVERE pain (e.g., excruciating) AND [2] not improved 2 hours after pain medicine/ice packs  Answer Assessment - Initial Assessment Questions 1. MECHANISM: "How did the injury happen?" (Consider the possibility of domestic violence or elder abuse)     vacuumed 2. ONSET: "When did the injury happen?" (Minutes or hours ago)    tuesday 3. LOCATION: "What part of the back is injured?"     Lower back 4. SEVERITY: "Can you move the back normally?"     Real painful getting up and down 5. PAIN: "Is there any pain?" If so, ask: "How bad is the pain?"   (Scale 1-10; or mild, moderate, severe)     7 6. CORD SYMPTOMS: Any weakness or numbness of the arms or legs?"     no 7. SIZE: For cuts, bruises, or swelling, ask: "How large is it?" (e.g., inches or centimeters)     No 8. TETANUS: For any breaks in the skin, ask: "When was the last tetanus booster?"    N/A 9. OTHER SYMPTOMS: "Do you have any other symptoms?" (e.g., abdominal pain, blood in urine)     no 10. PREGNANCY: "Is there any chance you are pregnant?" "When was your last menstrual period?"     N/A  Protocols used: BACK INJURY-A-AH

## 2019-02-15 ENCOUNTER — Encounter: Payer: Self-pay | Admitting: Family

## 2019-02-17 ENCOUNTER — Telehealth: Payer: Self-pay | Admitting: Neurology

## 2019-02-17 DIAGNOSIS — R269 Unspecified abnormalities of gait and mobility: Secondary | ICD-10-CM

## 2019-02-17 DIAGNOSIS — G3281 Cerebellar ataxia in diseases classified elsewhere: Secondary | ICD-10-CM

## 2019-02-17 NOTE — Telephone Encounter (Signed)
Dr. Jannifer Franklin, I called this patient today to schedule her 6 month follow-up. She states that she received the message you left about the MRI and she is agreeable to this. I advised pt that I would let you know and MRI coordinator will contact her in the coming days.

## 2019-02-17 NOTE — Telephone Encounter (Signed)
I will order the MRI of the brain. 

## 2019-02-17 NOTE — Addendum Note (Signed)
Addended by: Kathrynn Ducking on: 02/17/2019 05:02 PM   Modules accepted: Orders

## 2019-02-18 ENCOUNTER — Telehealth: Payer: Self-pay | Admitting: Neurology

## 2019-02-18 NOTE — Telephone Encounter (Signed)
Medicare/mutual of [place] order sent to GI. No auth they will reach out to the patient to schedule.

## 2019-02-19 ENCOUNTER — Other Ambulatory Visit: Payer: Self-pay | Admitting: Family Medicine

## 2019-02-19 MED ORDER — OMEPRAZOLE 40 MG PO CPDR: 40.0000 mg | DELAYED_RELEASE_CAPSULE | Freq: Two times a day (BID) | ORAL | 3 refills | Status: DC

## 2019-02-19 NOTE — Telephone Encounter (Signed)
Refills sent

## 2019-02-19 NOTE — Telephone Encounter (Signed)
Copied from [place] [id number]. Topic: Quick Communication - Rx Refill/Question >> Feb 19, 2019 10:42 AM Keene Breath wrote: Medication: omeprazole (PRILOSEC) 40 MG capsule  Patient called to request a refill for the above medication  Preferred Pharmacy (with phone number or street name): [id number] - [place], [place] - [place] [phone] (Phone) [phone] (Fax)

## 2019-02-25 DIAGNOSIS — H34831 Tributary (branch) retinal vein occlusion, right eye, with macular edema: Secondary | ICD-10-CM | POA: Diagnosis not present

## 2019-02-25 DIAGNOSIS — H43812 Vitreous degeneration, left eye: Secondary | ICD-10-CM | POA: Diagnosis not present

## 2019-02-25 DIAGNOSIS — H3582 Retinal ischemia: Secondary | ICD-10-CM | POA: Diagnosis not present

## 2019-02-25 DIAGNOSIS — H35422 Microcystoid degeneration of retina, left eye: Secondary | ICD-10-CM | POA: Diagnosis not present

## 2019-03-04 ENCOUNTER — Other Ambulatory Visit: Payer: Self-pay

## 2019-03-04 ENCOUNTER — Ambulatory Visit
Admission: RE | Admit: 2019-03-04 | Discharge: 2019-03-04 | Disposition: A | Discharge status: Home / Self Care | Payer: Medicare Other | Source: Ambulatory Visit | Attending: Neurology | Admitting: Neurology

## 2019-03-04 DIAGNOSIS — R269 Unspecified abnormalities of gait and mobility: Secondary | ICD-10-CM

## 2019-03-04 DIAGNOSIS — G3281 Cerebellar ataxia in diseases classified elsewhere: Secondary | ICD-10-CM

## 2019-03-06 ENCOUNTER — Other Ambulatory Visit: Payer: Medicare Other

## 2019-03-09 ENCOUNTER — Telehealth: Payer: Self-pay | Admitting: Neurology

## 2019-03-09 NOTE — Telephone Encounter (Signed)
I called the patient.  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  I have recommended getting into yoga or Pilates to help ongoing balance issues.  She has been through physical therapy.  With walking on a flat surface, the patient appears to have a relatively normal gait pattern, but she does have some mild gait instability with tandem gait.    MRI brain 03/06/19:  IMPRESSION: This MRI of the brain without contrast shows the following: 1.    There are T2/flair hyperintense foci in the subcortical and deep white matter both hemispheres and in the right pons.  The pattern is most consistent with chronic microvascular ischemic change.  None of the foci appear to be acute. 2.     There are no acute findings.

## 2019-03-18 NOTE — Progress Notes (Signed)
Cardiology Office Note   Date:  03/20/2019   ID:  Lisa Acosta, DOB 31-Jul-1951, MRN [id number]  PCP:  Darreld Mclean, MD  Cardiologist:   No chief complaint on file.    History of Present Illness: Lisa Acosta is a 68 y.o. female who presents for ongoing assessment of PAF, on Xarelto, Echo with Grade 1 diastolic dysfunction, trace mitral and tricuspid regurgitation.   ETT with mildly impaired exercise tolerance (4:20); no chest pain; normal BP response (SBP of 81 felt to be inaccurate); no ST changes; no exercise induced arrhythmias; negative inadequate ETT (pt achieved THR of 63% predicted).  On last office visit the patient was seen for preoperative evaluation and was cleared from cardiac standpoint.  She was to have an EGD due to frequent abdominal pain and GERD symptoms.  We are following posthospitalization after being seen on consultation for recurrent chest pain  She comes today with complaints of breakthrough atrial fibrillation.  She states that she on occasion has feelings of irregular heart rhythm which comes and goes lasting briefly.  She is also noticed that her blood pressure has occasionally risen into the 502D systolic.  She is taken an additional 1/2 tablet of atenolol (12.5 mg) to  help with blood pressure and her irregular heartbeat.  On one occasion she took a full 25 mg dose of atenolol additionally.  She has been medically compliant with flecainide and Xarelto.  She is in the "donut hole" concerning her Xarelto.  She is asking for assistance with this.  Past Medical History:  Diagnosis Date  . Atrial fibrillation ([place])   . Depression   . Diverticulitis   . Diverticulosis   . Esophageal ulcer   . Gait abnormality 10/14/2018  . GERD (gastroesophageal reflux disease)   . Hyperlipidemia   . Hypertension   . Hypothyroid   . OSA (obstructive sleep apnea) 08/31/2016  . Osteopenia   . 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 BREAST BIOPSY Left    needle core biopsy, benign  . CATARACT EXTRACTION    . HAND SURGERY    . KNEE SURGERY    . ROTATOR CUFF REPAIR    . TOTAL ABDOMINAL HYSTERECTOMY       Current Outpatient Medications  Medication Sig Dispense Refill  . amLODipine (NORVASC) 5 MG tablet TAKE 1 TABLET DAILY 90 tablet 1  . atenolol (TENORMIN) 50 MG tablet TAKE 1 TABLET DAILY 90 tablet 1  . BIOTIN PO Take 1 tablet by mouth daily.    . Calcium Carb-Cholecalciferol (CALCIUM 600 + D PO) Take by mouth.    . flecainide (TAMBOCOR) 100 MG tablet TAKE 1 TABLET TWICE A DAY 180 tablet 3  . Krill Oil 1000 MG CAPS Take 1 capsule by mouth daily.    [name] lactobacillus acidophilus (BACID) TABS tablet Take 1 tablet by mouth daily.    [name] levothyroxine (SYNTHROID, LEVOTHROID) 125 MCG tablet Take 1 tablet (125 mcg total) by mouth daily before breakfast. 90 tablet 2  . lovastatin (MEVACOR) 40 MG tablet Take 1 tablet (40 mg total) by mouth at bedtime. 30 tablet 5  . LUTEIN PO Take 1 capsule by mouth daily.    . magnesium 30 MG tablet Take 30 mg by mouth daily.    . methocarbamol (ROBAXIN) 500 MG tablet Take 1 tablet (500 mg total) by mouth every 8 (eight) hours as needed for muscle spasms. 20 tablet 0  . omeprazole (  PRILOSEC) 40 MG capsule Take 1 capsule (40 mg total) by mouth 2 (two) times daily. 180 capsule 3  . Potassium 75 MG TABS Take 1 tablet by mouth daily.    . sucralfate (CARAFATE) 1 g tablet Take 1 g by mouth 2 (two) times daily.    . Turmeric 450 MG CAPS Take 1 capsule by mouth daily.    [name] 20 MG TABS tablet TAKE 1 TABLET DAILY WITH   SUPPER 90 tablet 3  . FLUoxetine (PROZAC) 40 MG capsule Take 1 capsule (40 mg total) by mouth 2 (two) times daily. 180 capsule 0  . traZODone (DESYREL) 50 MG tablet Take 1.5 tablets (75 mg total) by mouth at bedtime. 135 tablet 0   No current facility-administered medications for this visit.     Allergies:   Dextran, Dextrans, Dextromethorphan  hbr, Tree extract, Penicillin g, Penicillins, Sulfa antibiotics, and Sulfacetamide sodium    Social History:  The patient  reports that she has never smoked. She has never used smokeless tobacco. She reports current alcohol use. She reports that she does not use drugs.   Family History:  The patient's family history includes Alcohol abuse in her father; Atrial fibrillation in her mother; Congestive Heart Failure in her mother; Depression in her daughter, mother, and sister; Heart disease in her brother, brother, and father.    ROS: All other systems are reviewed and negative. Unless otherwise mentioned in H&P    PHYSICAL EXAM: VS:  BP 126/68 (BP Location: Left Arm, Patient Position: Sitting, Cuff Size: Normal)   Pulse (!) 51   Temp 97.7 F (36.5 C)   Ht 5\' 1"  (1.549 m)   Wt 195 lb (88.5 kg)   BMI 36.84 kg/m  , BMI Body mass index is 36.84 kg/m. GEN: Well nourished, well developed, in no acute distress HEENT: normal Neck: no JVD, carotid bruits, or masses Cardiac: RRR; no murmurs, rubs, or gallops,no edema  Respiratory:  Clear to auscultation bilaterally, normal work of breathing GI: soft, nontender, nondistended, + BS MS: no deformity or atrophy Skin: warm and dry, no rash Neuro:  Strength and sensation are intact Psych: euthymic mood, full affect   EKG: Sinus bradycardia, first-degree AV block with a PR interval of 0.234 ms.  T wave flattening is noted in all precordial leads.  Recent Labs: 04/15/2018: ALT 18; BUN 14; Creatinine, Ser 0.71; Hemoglobin 13.4; Platelets 275.0; Potassium 4.6; Sodium 141; TSH 1.71    Lipid Panel    Component Value Date/Time   CHOL 203 (H) 04/15/2018 1145   TRIG 192.0 (H) 04/15/2018 1145   HDL 54.50 04/15/2018 1145   CHOLHDL 4 04/15/2018 1145   VLDL 38.4 04/15/2018 1145   LDLCALC 110 (H) 04/15/2018 1145   LDLDIRECT 112.0 01/22/2017 1559      Wt Readings from Last 3 Encounters:  03/20/19 195 lb (88.5 kg)  10/16/18 189 lb 12.8 oz (86.1 kg)   10/14/18 190 lb (86.2 kg)      Other studies Reviewed: Echocardiogram 11/18/15 Left ventricle: The cavity size was normal. Wall thickness was normal. Systolic function was normal. The estimated ejection fraction was in the range of 55% to 60%. Wall motion was normal; there were no regional wall motion abnormalities. Doppler parameters are consistent with abnormal left ventricular relaxation (grade 1 diastolic dysfunction).   ASSESSMENT AND PLAN:  1.  Atrial fibrillation: Currently on flecainide and atenolol for heart rate control.  She remains on Xarelto for anticoagulation with CHADS VASC Score of 2.  No changes are made in her medication regimen now.  I have advised her that if she continues to have to take an additional half a tablet of atenolol fairly regularly we will probably need to increase her atenolol dose to 75 mg daily.  She verbalizes understanding.  She will follow-up with Dr. Stanford Breed on next appointment.  2.  Hypertension: Blood pressures currently well controlled today.  I have advised her that if her blood pressure is elevated at 150/80 or above persistently, she is to give [place] a call.  I have advised her to take her blood pressure in a sitting position after approximately 5 minutes with her arm elevated on a table or armrest before taking.  I will check a BMET, magnesium, and CBC to evaluate for any abnormalities which may be precipitating her symptoms. Current medicines are reviewed at length with the patient today.    Labs/ tests ordered today include: BMET, CBC, Mg.   Phill Myron. West Pugh, ANP, AACC   03/20/2019 12:31 PM    [address] Office [phone] Fax [phone]

## 2019-03-19 ENCOUNTER — Telehealth: Payer: Self-pay | Admitting: Adult Health

## 2019-03-19 NOTE — Telephone Encounter (Signed)
° ° °  COVID-19 Pre-Screening Questions:   In the past 7 to 10 days have you had a cough,  shortness of breath, headache, congestion, fever (100 or greater) body aches, chills, sore throat, or sudden loss of taste or sense of smell? no  Have you been around anyone with known Covid 19. No   Have you been around anyone who is awaiting Covid 19 test results in the past 7 to 10 days? no  Have you been around anyone who has been exposed to Covid 19, or has mentioned symptoms of Covid 19 within the past 7 to 10 days? no  If you have any concerns/questions about symptoms patients report during screening (either on the phone or at threshold). Contact the provider seeing the patient or DOD for further guidance.  If neither are available contact a member of the leadership team.   I confirmed appt with pt for 03-20-19 with Lisa Acosta.

## 2019-03-20 ENCOUNTER — Other Ambulatory Visit: Payer: Self-pay

## 2019-03-20 ENCOUNTER — Encounter: Payer: Self-pay | Admitting: Adult Health

## 2019-03-20 ENCOUNTER — Ambulatory Visit (INDEPENDENT_AMBULATORY_CARE_PROVIDER_SITE_OTHER): Payer: Medicare Other | Admitting: Adult Health

## 2019-03-20 VITALS — BP 126/68 | HR 51 | Temp 97.7°F | Ht 61.0 in | Wt 195.0 lb

## 2019-03-20 DIAGNOSIS — E7800 Pure hypercholesterolemia, unspecified: Secondary | ICD-10-CM | POA: Diagnosis not present

## 2019-03-20 DIAGNOSIS — I10 Essential (primary) hypertension: Secondary | ICD-10-CM | POA: Diagnosis not present

## 2019-03-20 NOTE — Patient Instructions (Signed)
Medication Instructions:  Continue current medications  If you need a refill on your cardiac medications before your next appointment, please call your pharmacy.  Labwork: None Ordered   Testing/Procedures: None ordered  Follow-Up: You will need a follow up appointment in 6 months.  Please call our office 2 months in advance to schedule this appointment.  You may see Kirk Ruths, MD or one of the following Advanced Practice Providers on your designated Care Team:   Kerin Ransom, PA-C Roby Lofts, [place] . Sande Rives, PA-C     At [facility], you and your health needs are our priority.  As part of our continuing mission to provide you with exceptional heart care, we have created designated Provider Care Teams.  These Care Teams include your primary Cardiologist (physician) and Advanced Practice Providers (APPs -  Physician Assistants and Nurse Practitioners) who all work together to provide you with the care you need, when you need it.  Thank you for choosing [facility] at [facility]!!

## 2019-03-21 ENCOUNTER — Other Ambulatory Visit (INDEPENDENT_AMBULATORY_CARE_PROVIDER_SITE_OTHER): Payer: Medicare Other

## 2019-03-21 DIAGNOSIS — I10 Essential (primary) hypertension: Secondary | ICD-10-CM

## 2019-04-18 DIAGNOSIS — Z5181 Encounter for therapeutic drug level monitoring: Secondary | ICD-10-CM | POA: Diagnosis not present

## 2019-04-18 DIAGNOSIS — I10 Essential (primary) hypertension: Secondary | ICD-10-CM | POA: Diagnosis not present

## 2019-04-18 DIAGNOSIS — Z79899 Other long term (current) drug therapy: Secondary | ICD-10-CM | POA: Diagnosis not present

## 2019-04-18 DIAGNOSIS — I480 Paroxysmal atrial fibrillation: Secondary | ICD-10-CM | POA: Diagnosis not present

## 2019-05-01 ENCOUNTER — Other Ambulatory Visit: Payer: Self-pay

## 2019-05-02 ENCOUNTER — Encounter: Payer: Self-pay | Admitting: Family Medicine

## 2019-05-02 ENCOUNTER — Ambulatory Visit (INDEPENDENT_AMBULATORY_CARE_PROVIDER_SITE_OTHER): Payer: Medicare Other | Admitting: Family Medicine

## 2019-05-02 ENCOUNTER — Ambulatory Visit (HOSPITAL_BASED_OUTPATIENT_CLINIC_OR_DEPARTMENT_OTHER)
Admission: RE | Admit: 2019-05-02 | Discharge: 2019-05-02 | Disposition: A | Discharge status: Home / Self Care | Payer: Medicare Other | Source: Ambulatory Visit | Attending: Family Medicine | Admitting: Family Medicine

## 2019-05-02 VITALS — BP 130/66 | HR 55 | Temp 97.9°F | Resp 18 | Ht 61.0 in | Wt 194.8 lb

## 2019-05-02 DIAGNOSIS — I480 Paroxysmal atrial fibrillation: Secondary | ICD-10-CM | POA: Diagnosis not present

## 2019-05-02 DIAGNOSIS — Z23 Encounter for immunization: Secondary | ICD-10-CM

## 2019-05-02 DIAGNOSIS — M25531 Pain in right wrist: Secondary | ICD-10-CM

## 2019-05-02 MED ORDER — HYDROCODONE-ACETAMINOPHEN 5-325 MG PO TABS: 1.0000 | ORAL_TABLET | Freq: Four times a day (QID) | ORAL | 0 refills | Status: DC | PRN

## 2019-05-02 NOTE — Patient Instructions (Signed)
Wrist Pain, Adult There are many things that can cause wrist pain. Some common causes include:  An injury to the wrist area, such as a sprain, strain, or fracture.  Overuse of the joint.  A condition that causes increased pressure on a nerve in the wrist (carpal tunnel syndrome).  Wear and tear of the joints that occurs with aging (osteoarthritis).  A variety of other types of arthritis. Sometimes, the cause of wrist pain is not known. Often, the pain goes away when you follow instructions from your health care provider for relieving pain at home, such as resting or icing the wrist. If your wrist pain continues, it is important to tell your health care provider. Follow these instructions at home:  Rest the wrist area for at least 48 hours or as long as told by your health care provider.  If a splint or elastic bandage has been applied, use it as told by your health care provider. ? Remove the splint or bandage only as told by your health care provider. ? Loosen the splint or bandage if your fingers tingle, become numb, or turn cold or blue.  If directed, apply ice to the injured area. ? If you have a removable splint or elastic bandage, remove it as told by your health care provider. ? Put ice in a plastic bag. ? Place a towel between your skin and the bag or between your splint or bandage and the bag. ? Leave the ice on for 20 minutes, 2-3 times a day.   Keep your arm raised (elevated) above the level of your heart while you are sitting or lying down.  Take over-the-counter and prescription medicines only as told by your health care provider.  Keep all follow-up visits as told by your health care provider. This is important. Contact a health care provider if:  You have a sudden sharp pain in the wrist, hand, or arm that is different or new.  The swelling or bruising on your wrist or hand gets worse.  Your skin becomes red, gets a rash, or has open sores.  Your pain does not  get better or it gets worse. Get help right away if:  You lose feeling in your fingers or hand.  Your fingers turn white, very red, or cold and blue.  You cannot move your fingers.  You have a fever or chills. This information is not intended to replace advice given to you by your health care provider. Make sure you discuss any questions you have with your health care provider. Document Released: 06/07/2005 Document Revised: 08/10/2017 Document Reviewed: 03/16/2016 Elsevier Patient Education  2020 Elsevier Inc.  

## 2019-05-04 DIAGNOSIS — M25531 Pain in right wrist: Secondary | ICD-10-CM | POA: Insufficient documentation

## 2019-05-04 NOTE — Progress Notes (Signed)
Patient ID: Lisa Acosta, female    DOB: [birth year]  Age: 68 y.o. MRN: [id number]    Subjective:  Subjective  HPI Lisa Acosta presents for r wrist pain.  No known injury.  Pain has been worsening .  Nothing otc helps   Review of Systems  Constitutional: Negative for appetite change, diaphoresis, fatigue and unexpected weight change.  Eyes: Negative for pain, redness and visual disturbance.  Respiratory: Negative for cough, chest tightness, shortness of breath and wheezing.   Cardiovascular: Negative for chest pain, palpitations and leg swelling.  Endocrine: Negative for cold intolerance, heat intolerance, polydipsia, polyphagia and polyuria.  Genitourinary: Negative for difficulty urinating, dysuria and frequency.  Musculoskeletal: Positive for arthralgias.  Neurological: Negative for dizziness, light-headedness, numbness and headaches.    History Past Medical History:  Diagnosis Date  . Atrial fibrillation ([place])   . Depression   . Diverticulitis   . Diverticulosis   . Esophageal ulcer   . Gait abnormality 10/14/2018  . GERD (gastroesophageal reflux disease)   . Hyperlipidemia   . Hypertension   . Hypothyroid   . OSA (obstructive sleep apnea) 08/31/2016  . Osteopenia   . 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   Her family history includes Alcohol abuse in her father; Atrial fibrillation in her mother; Congestive Heart Failure in her mother; Depression in her daughter, mother, and sister; Heart disease in her brother, brother, and father.She reports that she has never smoked. She has never used smokeless tobacco. She reports current alcohol use. She reports that she does not use drugs.  Current Outpatient Medications on File Prior to Visit  Medication Sig Dispense Refill  . amLODipine  (NORVASC) 5 MG tablet TAKE 1 TABLET DAILY 90 tablet 1  . atenolol (TENORMIN) 50 MG tablet TAKE 1 TABLET DAILY 90 tablet 1  . BIOTIN PO Take 1 tablet by mouth daily.    . Calcium Carb-Cholecalciferol (CALCIUM 600 + D PO) Take by mouth.    . flecainide (TAMBOCOR) 100 MG tablet TAKE 1 TABLET TWICE A DAY 180 tablet 3  . Krill Oil 1000 MG CAPS Take 1 capsule by mouth daily.    [name] lactobacillus acidophilus (BACID) TABS tablet Take 1 tablet by mouth daily.    [name] levothyroxine (SYNTHROID, LEVOTHROID) 125 MCG tablet Take 1 tablet (125 mcg total) by mouth daily before breakfast. 90 tablet 2  . lovastatin (MEVACOR) 40 MG tablet Take 1 tablet (40 mg total) by mouth at bedtime. 30 tablet 5  . LUTEIN PO Take 1 capsule by mouth daily.    . magnesium 30 MG tablet Take 30 mg by mouth daily.    . methocarbamol (ROBAXIN) 500 MG tablet Take 1 tablet (500 mg total) by mouth every 8 (eight) hours as needed for muscle spasms. 20 tablet 0  . omeprazole (PRILOSEC) 40 MG capsule Take 1 capsule (40 mg total) by mouth 2 (two) times daily. 180 capsule 3  . Potassium 75 MG TABS Take 1 tablet by mouth daily.    . sucralfate (CARAFATE) 1 g tablet Take 1 g by mouth 2 (two) times daily.    . Turmeric 450 MG CAPS Take 1 capsule by mouth daily.    [name] 20 MG TABS tablet TAKE 1 TABLET DAILY WITH   SUPPER 90 tablet 3  . FLUoxetine (PROZAC) 40 MG capsule Take 1 capsule (  40 mg total) by mouth 2 (two) times daily. 180 capsule 0  . traZODone (DESYREL) 50 MG tablet Take 1.5 tablets (75 mg total) by mouth at bedtime. 135 tablet 0   No current facility-administered medications on file prior to visit.      Objective:  Objective  Physical Exam Vitals signs and nursing note reviewed.  Constitutional:      Appearance: She is well-developed.  HENT:     Head: Normocephalic and atraumatic.  Eyes:     Conjunctiva/sclera: Conjunctivae normal.  Neck:     Musculoskeletal: Normal range of motion and neck supple.     Thyroid: No  thyromegaly.     Vascular: No carotid bruit or JVD.  Cardiovascular:     Rate and Rhythm: Normal rate and regular rhythm.     Heart sounds: Normal heart sounds. No murmur.  Pulmonary:     Effort: Pulmonary effort is normal. No respiratory distress.     Breath sounds: Normal breath sounds. No wheezing or rales.  Chest:     Chest wall: No tenderness.  Musculoskeletal:        General: Swelling and tenderness present.     Right wrist: She exhibits tenderness and swelling.  Neurological:     Mental Status: She is alert and oriented to person, place, and time.    BP 130/66 (BP Location: Right Arm, Patient Position: Sitting, Cuff Size: Normal)   Pulse (!) 55   Temp 97.9 F (36.6 C)   Resp 18   Ht 5\' 1"  (1.549 m)   Wt 194 lb 12.8 oz (88.4 kg)   SpO2 97%   BMI 36.81 kg/m  Wt Readings from Last 3 Encounters:  05/02/19 194 lb 12.8 oz (88.4 kg)  03/20/19 195 lb (88.5 kg)  10/16/18 189 lb 12.8 oz (86.1 kg)     Lab Results  Component Value Date   WBC 7.3 04/15/2018   HGB 13.4 04/15/2018   HCT 40.0 04/15/2018   PLT 275.0 04/15/2018   GLUCOSE 88 04/15/2018   CHOL 203 (H) 04/15/2018   TRIG 192.0 (H) 04/15/2018   HDL 54.50 04/15/2018   LDLDIRECT 112.0 01/22/2017   LDLCALC 110 (H) 04/15/2018   ALT 18 04/15/2018   AST 18 04/15/2018   NA 141 04/15/2018   K 4.6 04/15/2018   CL 103 04/15/2018   CREATININE 0.71 04/15/2018   BUN 14 04/15/2018   CO2 32 04/15/2018   TSH 1.71 04/15/2018   INR 0.93 02/14/2011   HGBA1C 5.8 04/15/2018    Dg Wrist Complete Right  Result Date: 05/02/2019 CLINICAL DATA:  Right wrist pain without injury, hx osteoarthritis Previous hamate surgery, no injections EXAM: RIGHT WRIST - COMPLETE 3+ VIEW COMPARISON:  None FINDINGS: There is moderate degenerative change of the first carpometacarpal joint, with mild subluxation and significant joint space narrowing. No acute fracture or subluxation. IMPRESSION: Degenerative changes at the first carpometacarpal joint.  Electronically Signed   By: Nolon Nations M.D.   On: 05/02/2019 15:54     Assessment & Plan:  Plan  I am having Lisa Acosta start on HYDROcodone-acetaminophen. I am also having her maintain her Krill Oil, magnesium, lactobacillus acidophilus, Potassium, Turmeric, BIOTIN PO, LUTEIN PO, Calcium Carb-Cholecalciferol (CALCIUM 600 + D PO), flecainide, levothyroxine, Xarelto, FLUoxetine, traZODone, lovastatin, amLODipine, atenolol, methocarbamol, omeprazole, and sucralfate.  Meds ordered this encounter  Medications  . HYDROcodone-acetaminophen (NORCO/VICODIN) 5-325 MG tablet    Sig: Take 1 tablet by mouth every 6 (six) hours as needed for moderate  pain.    Dispense:  30 tablet    Refill:  0    Problem List Items Addressed This Visit      Unprioritized   Right wrist pain - Primary    Wrist splint Refer to ortho       Relevant Medications   HYDROcodone-acetaminophen (NORCO/VICODIN) 5-325 MG tablet   Other Relevant Orders   DG Wrist Complete Right (Completed)   Ambulatory referral to Orthopedic Surgery    Other Visit Diagnoses    Paroxysmal atrial fibrillation ([place])       Need for influenza vaccination       Relevant Orders   Flu Vaccine QUAD High Dose(Fluad) (Completed)      Follow-up: Return if symptoms worsen or fail to improve.  Ann Held, DO

## 2019-05-04 NOTE — Assessment & Plan Note (Signed)
Wrist splint Refer to ortho

## 2019-06-16 NOTE — Progress Notes (Addendum)
Subjective:   Lisa Acosta is a 68 y.o. female who presents for Medicare Annual (Subsequent) preventive examination.  Review of Systems:    Home Safety/Smoke Alarms: Feels safe in home. Smoke alarms in place.  Lives with husband in basement of dtr's house.  Female:    Mammo- 01/24/18. ordered      Dexa scan- 01/24/18       CCS- 2014 per pt     Objective:     Vitals: BP 132/70 (BP Location: Left Arm, Patient Position: Sitting, Cuff Size: Large)   Pulse (!) 55   Temp (!) 97.2 F (36.2 C) (Temporal)   Ht 5\' 1"  (1.549 m)   Wt 194 lb 3.2 oz (88.1 kg)   SpO2 97%   BMI 36.69 kg/m   Body mass index is 36.69 kg/m.  Advanced Directives 06/17/2019 07/24/2018 01/30/2018 01/22/2018 01/11/2018 08/21/2017 03/13/2016  Does Patient Have a Medical Advance Directive? Yes No - No Yes No No  Type of Paramedic of [place];Living will - [place];Living will - -  Does patient want to make changes to medical advance directive? No - Patient declined - - - - - -  Copy of [place] in Chart? No - copy requested - - - No - copy requested - -  Would patient like information on creating a medical advance directive? - - No - Patient declined No - Patient declined - - -    Tobacco Social History   Tobacco Use  Smoking Status Never Smoker  Smokeless Tobacco Never Used     Counseling given: Not Answered   Clinical Intake:     Pain : 0-10 Pain Score: 5  Pain Type: Acute pain Pain Location: Scapula Pain Orientation: Right Pain Descriptors / Indicators: (Feels like muscle pain) Pain Onset: 1 to 4 weeks ago Pain Frequency: Constant Pain Relieving Factors: heat pack  Pain Relieving Factors: heat pack              Past Medical History:  Diagnosis Date  . Atrial fibrillation ([place])   . Depression   . Diverticulitis   . Diverticulosis   . Esophageal ulcer   . Gait abnormality 10/14/2018  .  GERD (gastroesophageal reflux disease)   . Hyperlipidemia   . Hypertension   . Hypothyroid   . OSA (obstructive sleep apnea) 08/31/2016  . Osteopenia   . 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 BREAST BIOPSY Left    needle core biopsy, benign  . CATARACT EXTRACTION    . HAND SURGERY    . KNEE SURGERY    . ROTATOR CUFF REPAIR    . TOTAL ABDOMINAL HYSTERECTOMY     Family History  Problem Relation Age of Onset  . Atrial fibrillation Mother   . Congestive Heart Failure Mother   . Depression Mother   . Heart disease Father   . Alcohol abuse Father   . Heart disease Brother   . Heart disease Brother   . Depression Sister   . Depression Daughter    Social History   Socioeconomic History  . Marital status: Married    Spouse name: Jenny Reichmann  . Number of children: 1  . Years of education: College  . Highest education level: Not on file  Occupational History  . 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 Financial resource strain: Not hard at all  . Food insecurity    Worry: Never true    Inability: Never true  . Transportation needs    Medical: No    Non-medical: No  Tobacco Use  . Smoking status: Never Smoker  . Smokeless tobacco: Never Used  Substance and Sexual Activity  . Alcohol use: Yes    Comment: rare  . Drug use: No  . Sexual activity: Not Currently  Lifestyle  . Physical activity    Days per week: 0 days    Minutes per session: 0 min  . Stress: Rather much  Relationships  . Social Herbalist on phone: Not on file    Gets together: Not on file    Attends religious service: More than 4 times per year    Active member of club or organization: No    Attends meetings of clubs or organizations: Never    Relationship status: Married  Other Topics Concern  . Not on file  Social History Narrative   Patient lives at home spouse.   Caffeine  use: 2 sodas weekly   Right handed     Outpatient Encounter Medications as of 06/17/2019  Medication Sig  . amLODipine (NORVASC) 5 MG tablet TAKE 1 TABLET DAILY  . atenolol (TENORMIN) 50 MG tablet TAKE 1 TABLET DAILY  . BIOTIN PO Take 1 tablet by mouth daily.  . Calcium Carb-Cholecalciferol (CALCIUM 600 + D PO) Take by mouth.  . flecainide (TAMBOCOR) 100 MG tablet TAKE 1 TABLET TWICE A DAY  . Krill Oil 1000 MG CAPS Take 1 capsule by mouth daily.  [name] lactobacillus acidophilus (BACID) TABS tablet Take 1 tablet by mouth daily.  [name] levothyroxine (SYNTHROID, LEVOTHROID) 125 MCG tablet Take 1 tablet (125 mcg total) by mouth daily before breakfast.  . lovastatin (MEVACOR) 40 MG tablet Take 1 tablet (40 mg total) by mouth at bedtime.  . LUTEIN PO Take 1 capsule by mouth daily.  . magnesium 30 MG tablet Take 30 mg by mouth daily.  . methocarbamol (ROBAXIN) 500 MG tablet Take 1 tablet (500 mg total) by mouth every 8 (eight) hours as needed for muscle spasms.  [name] omeprazole (PRILOSEC) 40 MG capsule Take 1 capsule (40 mg total) by mouth 2 (two) times daily.  . Potassium 75 MG TABS Take 1 tablet by mouth daily.  . sucralfate (CARAFATE) 1 g tablet Take 1 g by mouth 2 (two) times daily.  . Turmeric 450 MG CAPS Take 1 capsule by mouth daily.  [name] 20 MG TABS tablet TAKE 1 TABLET DAILY WITH   SUPPER  . FLUoxetine (PROZAC) 40 MG capsule Take 1 capsule (40 mg total) by mouth 2 (two) times daily.  [name] HYDROcodone-acetaminophen (NORCO/VICODIN) 5-325 MG tablet Take 1 tablet by mouth every 6 (six) hours as needed for moderate pain. (Patient not taking: Reported on 06/17/2019)  . traZODone (DESYREL) 50 MG tablet Take 1.5 tablets (75 mg total) by mouth at bedtime.   No facility-administered encounter medications on file as of 06/17/2019.     Activities of Daily Living In your present state of health, do you have any difficulty performing the following activities: 06/17/2019  Hearing? Y  Comment has hearing aids.  Not wearing them.  Vision? N  Difficulty concentrating or making decisions? N  Walking or climbing stairs? N  Dressing or bathing? N  Doing errands, shopping? N  Preparing Food and eating ? N  Using the Toilet?  N  In the past six months, have you accidently leaked urine? N  Do you have problems with loss of bowel control? N  Managing your Medications? N  Managing your Finances? N  Housekeeping or managing your Housekeeping? N  Some recent data might be hidden    Patient Care Team: Copland, Gay Filler, MD as PCP - General (Family Medicine) Stanford Breed Denice Bors, MD as PCP - Cardiology (Cardiology)    Assessment:   This is a routine wellness examination for Lisa Acosta. Physical assessment deferred to PCP.  Exercise Activities and Dietary recommendations Current Exercise Habits: Home exercise routine, Type of exercise: walking, Time (Minutes): 10, Frequency (Times/Week): 1, Weekly Exercise (Minutes/Week): 10, Exercise limited by: None identified Diet (meal preparation, eat out, water intake, caffeinated beverages, dairy products, fruits and vegetables): well balanced      Goals    . Increase physical activity     Do recumbent bike or outdoor pool at least 3x/ week.       Fall Risk Fall Risk  06/17/2019 06/17/2019 08/01/2018 01/11/2018 01/22/2017  Falls in the past year? 0 0 1 Yes No  Number falls in past yr: 0 - 1 2 or more -  Injury with Fall? 0 - 1 - -  Risk for fall due to : - - - (No Data) -  Risk for fall due to: Comment - - - pt states her legs seem weak -  Follow up - - - Falls prevention discussed;Education provided -    Depression Screen PHQ 2/9 Scores 06/17/2019 01/11/2018 01/22/2017 02/24/2016  PHQ - 2 Score 0 0 0 0  PHQ- 9 Score - - - -  Exception Documentation - - Patient refusal -     Cognitive Function Ad8 score reviewed for issues:  Issues making decisions:no  Less interest in hobbies / activities:no  Repeats questions, stories (family complaining):no  Trouble  using ordinary gadgets (microwave, computer, phone):no  Forgets the month or year: no  Mismanaging finances: no  Remembering appts:no  Daily problems with thinking and/or memory:no Ad8 score is=0  MMSE - Mini Mental State Exam 10/14/2018 08/13/2017  Orientation to time 5 5  Orientation to Place 5 5  Registration 3 3  Attention/ Calculation 5 5  Recall 2 3  Language- name 2 objects 2 2  Language- repeat 1 1  Language- follow 3 step command 3 3  Language- read & follow direction 1 1  Write a sentence 1 1  Copy design 1 1  Total score 29 30        Immunization History  Administered Date(s) Administered  . Fluad Quad(high Dose 65+) 05/02/2019  . Influenza, High Dose Seasonal PF 06/14/2016, 05/15/2018  . Influenza,inj,Quad PF,6+ Mos 06/11/2014  . Pneumococcal Conjugate-13 08/07/2016  . Pneumococcal Polysaccharide-23 01/28/2018  . Tdap 01/28/2018   Screening Tests Health Maintenance  Topic Date Due  . MAMMOGRAM  01/25/2020  . COLONOSCOPY  09/12/2023  . TETANUS/TDAP  01/29/2028  . INFLUENZA VACCINE  Completed  . DEXA SCAN  Completed  . Hepatitis C Screening  Completed  . PNA vac Low Risk Adult  Completed      Plan:   I have scheduled you an appt with Dr.Copland to follow up on the pain you are have.   I have ordered you mammogram. Please schedule.  Please schedule your next medicare wellness visit with me in 1 yr.  Bring a copy of your living will and/or healthcare power of attorney to your next office visit.  Continue doing brain stimulating activities (puzzles, reading, adult coloring books, staying active) to keep memory sharp.   Continue to eat heart healthy diet (full of fruits, vegetables, whole grains, lean protein, water--limit salt, fat, and sugar intake) and increase physical activity as tolerated.   I have personally reviewed and noted the following in the patient's chart:   . Medical and social history . Use of alcohol, tobacco or illicit drugs  .  Current medications and supplements . Functional ability and status . Nutritional status . Physical activity . Advanced directives . List of other physicians . Hospitalizations, surgeries, and ER visits in previous 12 months . Vitals . Screenings to include cognitive, depression, and falls . Referrals and appointments  In addition, I have reviewed and discussed with patient certain preventive protocols, quality metrics, and best practice recommendations. A written personalized care plan for preventive services as well as general preventive health recommendations were provided to patient.     Shela Nevin, RN  06/17/2019    Reviewed  Ann Held, DO

## 2019-06-17 ENCOUNTER — Ambulatory Visit (INDEPENDENT_AMBULATORY_CARE_PROVIDER_SITE_OTHER): Payer: Medicare Other | Admitting: *Deleted

## 2019-06-17 ENCOUNTER — Other Ambulatory Visit: Payer: Self-pay

## 2019-06-17 ENCOUNTER — Encounter: Payer: Self-pay | Admitting: *Deleted

## 2019-06-17 VITALS — BP 132/70 | HR 55 | Temp 97.2°F | Ht 61.0 in | Wt 194.2 lb

## 2019-06-17 DIAGNOSIS — Z1231 Encounter for screening mammogram for malignant neoplasm of breast: Secondary | ICD-10-CM | POA: Diagnosis not present

## 2019-06-17 DIAGNOSIS — Z0000 Encounter for general adult medical examination without abnormal findings: Secondary | ICD-10-CM

## 2019-06-17 NOTE — Patient Instructions (Signed)
I have scheduled you an appt with Dr.Copland to follow up on the pain you are have.   I have ordered you mammogram. Please schedule.  Please schedule your next medicare wellness visit with me in 1 yr.  Bring a copy of your living will and/or healthcare power of attorney to your next office visit.  Continue doing brain stimulating activities (puzzles, reading, adult coloring books, staying active) to keep memory sharp.   Continue to eat heart healthy diet (full of fruits, vegetables, whole grains, lean protein, water--limit salt, fat, and sugar intake) and increase physical activity as tolerated.   [name] , Thank you for taking time to come for your Medicare Wellness Visit. I appreciate your ongoing commitment to your health goals. Please review the following plan we discussed and let me know if I can assist you in the future.   These are the goals we discussed: Goals    . DIET - EAT MORE FRUITS AND VEGETABLES    . Increase physical activity     Do recumbent bike or outdoor pool at least 3x/ week.       This is a list of the screening recommended for you and due dates:  Health Maintenance  Topic Date Due  . Mammogram  01/25/2020  . Colon Cancer Screening  09/12/2023  . Tetanus Vaccine  01/29/2028  . Flu Shot  Completed  . DEXA scan (bone density measurement)  Completed  .  Hepatitis C: One time screening is recommended by Center for Disease Control  (CDC) for  adults born from [birth year] through 1966.   Completed  . Pneumonia vaccines  Completed    Health Maintenance After Age 31 After age 50, you are at a higher risk for certain long-term diseases and infections as well as injuries from falls. Falls are a major cause of broken bones and head injuries in people who are older than age 15. Getting regular preventive care can help to keep you healthy and well. Preventive care includes getting regular testing and making lifestyle changes as recommended by your health care provider.  Talk with your health care provider about:  Which screenings and tests you should have. A screening is a test that checks for a disease when you have no symptoms.  A diet and exercise plan that is right for you. What should I know about screenings and tests to prevent falls? Screening and testing are the best ways to find a health problem early. Early diagnosis and treatment give you the best chance of managing medical conditions that are common after age 77. Certain conditions and lifestyle choices may make you more likely to have a fall. Your health care provider may recommend:  Regular vision checks. Poor vision and conditions such as cataracts can make you more likely to have a fall. If you wear glasses, make sure to get your prescription updated if your vision changes.  Medicine review. Work with your health care provider to regularly review all of the medicines you are taking, including over-the-counter medicines. Ask your health care provider about any side effects that may make you more likely to have a fall. Tell your health care provider if any medicines that you take make you feel dizzy or sleepy.  Osteoporosis screening. Osteoporosis is a condition that causes the bones to get weaker. This can make the bones weak and cause them to break more easily.  Blood pressure screening. Blood pressure changes and medicines to control blood pressure can make you feel  dizzy.  Strength and balance checks. Your health care provider may recommend certain tests to check your strength and balance while standing, walking, or changing positions.  Foot health exam. Foot pain and numbness, as well as not wearing proper footwear, can make you more likely to have a fall.  Depression screening. You may be more likely to have a fall if you have a fear of falling, feel emotionally low, or feel unable to do activities that you used to do.  Alcohol use screening. Using too much alcohol can affect your balance  and may make you more likely to have a fall. What actions can I take to lower my risk of falls? General instructions  Talk with your health care provider about your risks for falling. Tell your health care provider if: ? You fall. Be sure to tell your health care provider about all falls, even ones that seem minor. ? You feel dizzy, sleepy, or off-balance.  Take over-the-counter and prescription medicines only as told by your health care provider. These include any supplements.  Eat a healthy diet and maintain a healthy weight. A healthy diet includes low-fat dairy products, low-fat (lean) meats, and fiber from whole grains, beans, and lots of fruits and vegetables. Home safety  Remove any tripping hazards, such as rugs, cords, and clutter.  Install safety equipment such as grab bars in bathrooms and safety rails on stairs.  Keep rooms and walkways well-lit. Activity   Follow a regular exercise program to stay fit. This will help you maintain your balance. Ask your health care provider what types of exercise are appropriate for you.  If you need a cane or walker, use it as recommended by your health care provider.  Wear supportive shoes that have nonskid soles. Lifestyle  Do not drink alcohol if your health care provider tells you not to drink.  If you drink alcohol, limit how much you have: ? 0-1 drink a day for women. ? 0-2 drinks a day for men.  Be aware of how much alcohol is in your drink. In the [place] one drink equals one typical bottle of beer (12 oz), one-half glass of wine (5 oz), or one shot of hard liquor (1 oz).  Do not use any products that contain nicotine or tobacco, such as cigarettes and e-cigarettes. If you need help quitting, ask your health care provider. Summary  Having a healthy lifestyle and getting preventive care can help to protect your health and wellness after age 7.  Screening and testing are the best way to find a health problem early and help  you avoid having a fall. Early diagnosis and treatment give you the best chance for managing medical conditions that are more common for people who are older than age 62.  Falls are a major cause of broken bones and head injuries in people who are older than age 87. Take precautions to prevent a fall at home.  Work with your health care provider to learn what changes you can make to improve your health and wellness and to prevent falls. This information is not intended to replace advice given to you by your health care provider. Make sure you discuss any questions you have with your health care provider. Document Released: 07/11/2017 Document Revised: 12/19/2018 Document Reviewed: 07/11/2017 Elsevier Patient Education  2020 [employer].

## 2019-06-19 NOTE — Patient Instructions (Addendum)
It was great to see you again today Please consider getting the shingles vaccine at your drugstore I will be in touch with your labs asap I think your back pain is due to a rotator cuff strain.  Please use the robaxin as needed for pain, and work on some of the stretches and exercises that I gave you today If not containing to get better please contact me

## 2019-06-19 NOTE — Progress Notes (Addendum)
[facility] [place], [place], [place] [address] [phone]  Date:  06/23/2019   Name:  Lisa Acosta   DOB:  1951-04-12   MRN:  [id number]  PCP:  Darreld Mclean, MD    Chief Complaint: Shoulder Pain (Scapula, slowly getting better,  no known injury, 3 weeks, muscle related) and Handicap Form   History of Present Illness:  Lisa Acosta is a 68 y.o. very pleasant female patient who presents with the following:  Patient with history of hypertension, atrial fibrillation, hyperlipidemia, osteopenia, hypothyroidism, hyperlipidemia, poor balance and gait abnormality, depression  Here today with concern of pain in her back- in the upper right back, around the shoulder blade It has bothered her for about 3 weeks Getting a bit better over the last several days NKI She is not aware of any injury or overuse  She has been using some robaxin prn for her sx-this does seem to help, but she is now out No SOB When asked about chest pain, she notes that the pain from her back may sometimes radiate into her shoulder and right chest, but is non exertional.  Not present now  She did have a rotator cuff repair in 1997 and then again in 2012?  Both times RIGHT arm   Health maintenance is up-to-date Can suggest shingles vaccine- discussed with her, would need to have done a drug stool  She was seen in August by my partner Dr. Etter Sjogren for wrist pain Her specialty cardiologist is with [facility], most recent visit in August of this year She also sees Dr. Stanford Breed locally Per most recent [place] cardiology note: Paroxysmal atrial fibrillation: Patient has been on Flecainide since 2017, which has worked well for her. Will continue current regimen with Flecainide 100mg  bid for rhythm control and Atenolol for rate control. Will monitor HR since she has been a little bradycardiac. She will have CBC, CMP today. Advised patient to call  office if she has increased frequency of palpitations, will get Holter monitor then. CHADS-VASc is 3 (HTN, Age 16, and Female). Will continue Xarelto 20mg  daily for stroke prophylaxis. She did report possible "mini stroke" from recent brain MRI, which increases her CHADS-VASc score and risk of CVA. Will avoid increasing B-B based on her EKG today which is more bradycardic. RTC in one year. Cardiology 6 month.  HTN: Her BP is very good in office today. Recent BP at goal. Will continue Norvasc 5mg  daily and Atenolol 50mg  daily. Will consider to increase Norvasc if her BP is consistently above 140/90's.   She also saw Dr. Jannifer Franklin with neurology over the summer.  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  He recommended yoga/Pilates to work on her balance  She was doing well with her weight for a while and was exercising at the outdoor pool during the summer She is not doing this right now however due to colder weather She had lost some weight to 185 but has gained back some as she is now exercising less She is frustrated by this, hopes to get her weight down again  Her daughter is teaching and her grandson is in school- 10th grade.  There have been issues with Covid cases at their school    Pulse Readings from Last 3 Encounters:  06/23/19 (!) 53  06/17/19 (!) 55  05/02/19 (!)  24     Wt Readings from Last 3 Encounters:  06/23/19 194 lb (88 kg)  06/17/19 194 lb 3.2 oz (88.1 kg)  05/02/19 194 lb 12.8 oz (88.4 kg)     Patient Active Problem List   Diagnosis Date Noted  . Right wrist pain 05/04/2019  . Mild episode of recurrent major depressive disorder ([place]) 12/11/2018  . Depression, major, single episode, mild ([place]) 10/22/2018  . Gait abnormality 10/14/2018  . 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 Moderate episode of recurrent major depressive disorder ([place]) 09/26/2018  . Generalized anxiety disorder  09/26/2018  . Osteopenia 01/25/2018  . 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 Dyspnea 09/22/2016  . OSA (obstructive sleep apnea) 08/31/2016  . PAF (paroxysmal atrial fibrillation) ([place]) 03/16/2016  . Hypothyroid 03/16/2016  . Obesity (BMI 30-39.9) 03/16/2016  . Essential hypertension 11/10/2015  . Hyperlipidemia 11/10/2015    Past Medical History:  Diagnosis Date  . Atrial fibrillation ([place])   . Depression   . Diverticulitis   . Diverticulosis   . Esophageal ulcer   . Gait abnormality 10/14/2018  . GERD (gastroesophageal reflux disease)   . Hyperlipidemia   . Hypertension   . Hypothyroid   . OSA (obstructive sleep apnea) 08/31/2016  . Osteopenia   . 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 BREAST BIOPSY Left    needle core biopsy, benign  . CATARACT EXTRACTION    . HAND SURGERY    . KNEE SURGERY    . ROTATOR CUFF REPAIR    . TOTAL ABDOMINAL HYSTERECTOMY      Social History   Tobacco Use  . Smoking status: Never Smoker  . Smokeless tobacco: Never Used  Substance Use Topics  . Alcohol use: Yes    Comment: rare  . Drug use: No    Family History  Problem Relation Age of Onset  . Atrial fibrillation Mother   . Congestive Heart Failure Mother   . Depression Mother   . Heart disease Father   . Alcohol abuse Father   . Heart disease Brother   . Heart disease Brother   . Depression Sister   . Depression Daughter     Allergies  Allergen Reactions  . Dextran Anaphylaxis  . Dextrans   . Dextromethorphan Hbr Other (See Comments)    hallucinations  . Tree Extract   . Penicillin G Rash  . Penicillins Rash  . Sulfa Antibiotics Rash  . Sulfacetamide Sodium Rash    Medication list has been reviewed and updated.  Current Outpatient Medications on File Prior to Visit  Medication Sig Dispense Refill  . amLODipine (NORVASC) 5 MG tablet TAKE 1 TABLET DAILY 90 tablet 1  . atenolol (TENORMIN) 50  MG tablet TAKE 1 TABLET DAILY 90 tablet 1  . BIOTIN PO Take 1 tablet by mouth daily.    . Calcium Carb-Cholecalciferol (CALCIUM 600 + D PO) Take by mouth.    . flecainide (TAMBOCOR) 100 MG tablet TAKE 1 TABLET TWICE A DAY 180 tablet 3  . Krill Oil 1000 MG CAPS Take 1 capsule by mouth daily.    [name] lactobacillus acidophilus (BACID) TABS tablet Take 1 tablet by mouth daily.    [name] levothyroxine (SYNTHROID, LEVOTHROID) 125 MCG tablet Take 1 tablet (125 mcg total) by mouth daily before breakfast. 90 tablet 2  . lovastatin (MEVACOR) 40 MG tablet Take 1 tablet (40 mg total) by mouth at bedtime. 30 tablet 5  . LUTEIN  PO Take 1 capsule by mouth daily.    . magnesium 30 MG tablet Take 30 mg by mouth daily.    . methocarbamol (ROBAXIN) 500 MG tablet Take 1 tablet (500 mg total) by mouth every 8 (eight) hours as needed for muscle spasms. 20 tablet 0  . omeprazole (PRILOSEC) 40 MG capsule Take 1 capsule (40 mg total) by mouth 2 (two) times daily. 180 capsule 3  . Potassium 75 MG TABS Take 1 tablet by mouth daily.    . sucralfate (CARAFATE) 1 g tablet Take 1 g by mouth 2 (two) times daily.    . Turmeric 450 MG CAPS Take 1 capsule by mouth daily.    [name] 20 MG TABS tablet TAKE 1 TABLET DAILY WITH   SUPPER 90 tablet 3  . FLUoxetine (PROZAC) 40 MG capsule Take 1 capsule (40 mg total) by mouth 2 (two) times daily. 180 capsule 0  . traZODone (DESYREL) 50 MG tablet Take 1.5 tablets (75 mg total) by mouth at bedtime. 135 tablet 0   No current facility-administered medications on file prior to visit.     Review of Systems:  As per HPI- otherwise negative. No fever or chills  Physical Examination: Vitals:   06/23/19 1008  BP: 122/70  Pulse: (!) 53  Resp: 16  Temp: (!) 97.4 F (36.3 C)  SpO2: 98%   Vitals:   06/23/19 1008  Weight: 194 lb (88 kg)  Height: 5\' 1"  (1.549 m)   Body mass index is 36.66 kg/m. Ideal Body Weight: Weight in (lb) to have BMI = 25: 132  GEN: WDWN, NAD, Non-toxic, A & O x  3, obese, looks well HEENT: Atraumatic, Normocephalic. Neck supple. No masses, No LAD. Ears and Nose: No external deformity. CV: RRR-mild bradycardia as is baseline for her, No M/G/R. No JVD. No thrill. No extra heart sounds. PULM: CTA B, no wheezes, crackles, rhonchi. No retractions. No resp. distress. No accessory muscle use. ABD: S, NT, ND, +BS. No rebound. No HSM. EXTR: No c/c/e NEURO Normal gait.  PSYCH: Normally interactive. Conversant. Not depressed or anxious appearing.  Calm demeanor.  She has no strength and range of motion in her right shoulder, but does have some pain with rotator cuff testing.  She has mild tenderness over the right scapula, more at the medial border.  No redness or swelling, no skin changes.  Chest wall is mildly tender to palpation   Assessment and Plan: Pain of right scapula - Plan: methocarbamol (ROBAXIN) 500 MG tablet  Screening for deficiency anemia  Pure hypercholesterolemia - Plan: Lipid panel  Essential hypertension - Plan: CBC, Comprehensive metabolic panel  Screening for diabetes mellitus - Plan: Hemoglobin A1c  Atypical chest pain - Plan: Troponin I (High Sensitivity), CANCELED: Troponin I, CANCELED: Troponin I (High Sensitivity)  History of elevated glucose - Plan: Hemoglobin A1c  Here today with concern of pain of the right scapula.  She has undergone 2 right rotator cuff operations in the past.  I suspect she is actually having rotator cuff symptoms.  Gave her a handout regarding stretches and rehabilitation exercises to work on at home.  Refilled her Robaxin use as needed, cautioned her this may cause sedation She does mention right sided chest discomfort which she thinks is radiating from her back.  She did a negative stress about 2 years ago.  Will obtain a troponin today for reassurance that there is no element of CAD We now sooner rather than later if her shoulder pain  is not improving, in that case she may benefit from formal  PT  Routine labs pending as above Advised her to have shingles vaccine given at her drugstore her convenience  Signed Lamar Blinks, MD  Received her labs, message to pt  Results for orders placed or performed in visit on 06/23/19  CBC  Result Value Ref Range   WBC 6.8 4.0 - 10.5 K/uL   RBC 4.66 3.87 - 5.11 Mil/uL   Platelets 255.0 150.0 - 400.0 K/uL   Hemoglobin 13.6 12.0 - 15.0 g/dL   HCT 40.5 36.0 - 46.0 %   MCV 87.1 78.0 - 100.0 fl   MCHC 33.5 30.0 - 36.0 g/dL   RDW 13.2 11.5 - 15.5 %  Comprehensive metabolic panel  Result Value Ref Range   Sodium 139 135 - 145 mEq/L   Potassium 3.8 3.5 - 5.1 mEq/L   Chloride 102 96 - 112 mEq/L   CO2 30 19 - 32 mEq/L   Glucose, Bld 104 (H) 70 - 99 mg/dL   BUN 18 6 - 23 mg/dL   Creatinine, Ser 0.78 0.40 - 1.20 mg/dL   Total Bilirubin 0.5 0.2 - 1.2 mg/dL   Alkaline Phosphatase 112 39 - 117 U/L   AST 27 0 - 37 U/L   ALT 31 0 - 35 U/L   Total Protein 6.9 6.0 - 8.3 g/dL   Albumin 3.9 3.5 - 5.2 g/dL   Calcium 9.4 8.4 - 10.5 mg/dL   GFR 73.19 >60.00 mL/min  Hemoglobin A1c  Result Value Ref Range   Hgb A1c MFr Bld 5.9 4.6 - 6.5 %  Lipid panel  Result Value Ref Range   Cholesterol 211 (H) 0 - 200 mg/dL   Triglycerides 121.0 0.0 - 149.0 mg/dL   HDL 57.50 >39.00 mg/dL   VLDL 24.2 0.0 - 40.0 mg/dL   LDL Cholesterol 130 (H) 0 - 99 mg/dL   Total CHOL/HDL Ratio 4    NonHDL 153.80   Troponin I (High Sensitivity)  Result Value Ref Range   High Sens Troponin I 7 2 - 17 ng/L    Reviewed Ann Held, DO

## 2019-06-23 ENCOUNTER — Encounter: Payer: Self-pay | Admitting: Family Medicine

## 2019-06-23 ENCOUNTER — Ambulatory Visit (HOSPITAL_BASED_OUTPATIENT_CLINIC_OR_DEPARTMENT_OTHER)
Admission: RE | Admit: 2019-06-23 | Discharge: 2019-06-23 | Disposition: A | Discharge status: Home / Self Care | Payer: Medicare Other | Source: Ambulatory Visit | Attending: Family | Admitting: Family

## 2019-06-23 ENCOUNTER — Other Ambulatory Visit: Payer: Self-pay

## 2019-06-23 ENCOUNTER — Encounter (HOSPITAL_BASED_OUTPATIENT_CLINIC_OR_DEPARTMENT_OTHER): Payer: Self-pay

## 2019-06-23 ENCOUNTER — Ambulatory Visit (INDEPENDENT_AMBULATORY_CARE_PROVIDER_SITE_OTHER): Payer: Medicare Other | Admitting: Family Medicine

## 2019-06-23 VITALS — BP 122/70 | HR 53 | Temp 97.4°F | Resp 16 | Ht 61.0 in | Wt 194.0 lb

## 2019-06-23 DIAGNOSIS — M898X1 Other specified disorders of bone, shoulder: Secondary | ICD-10-CM

## 2019-06-23 DIAGNOSIS — Z8639 Personal history of other endocrine, nutritional and metabolic disease: Secondary | ICD-10-CM | POA: Diagnosis not present

## 2019-06-23 DIAGNOSIS — Z130 Encounter for screening for diseases of the blood and blood-forming organs and certain disorders involving the immune mechanism: Secondary | ICD-10-CM | POA: Diagnosis not present

## 2019-06-23 DIAGNOSIS — R0789 Other chest pain: Secondary | ICD-10-CM | POA: Diagnosis not present

## 2019-06-23 DIAGNOSIS — I10 Essential (primary) hypertension: Secondary | ICD-10-CM

## 2019-06-23 DIAGNOSIS — E7800 Pure hypercholesterolemia, unspecified: Secondary | ICD-10-CM

## 2019-06-23 DIAGNOSIS — Z131 Encounter for screening for diabetes mellitus: Secondary | ICD-10-CM | POA: Diagnosis not present

## 2019-06-23 DIAGNOSIS — Z1231 Encounter for screening mammogram for malignant neoplasm of breast: Secondary | ICD-10-CM | POA: Diagnosis not present

## 2019-06-23 LAB — LIPID PANEL
Cholesterol: 211 mg/dL — ABNORMAL HIGH (ref 0–200)
HDL: 57.5 mg/dL (ref >39.00)
LDL Cholesterol: 130 mg/dL — ABNORMAL HIGH (ref 0–99)
NonHDL: 153.8
Total CHOL/HDL Ratio: 4
Triglycerides: 121 mg/dL (ref 0.0–149.0)
VLDL: 24.2 mg/dL (ref 0.0–40.0)

## 2019-06-23 LAB — CBC
HCT: 40.5 % (ref 36.0–46.0)
Hemoglobin: 13.6 g/dL (ref 12.0–15.0)
MCHC: 33.5 g/dL (ref 30.0–36.0)
MCV: 87.1 fl (ref 78.0–100.0)
Platelets: 255 10*3/uL (ref 150.0–400.0)
RBC: 4.66 Mil/uL (ref 3.87–5.11)
RDW: 13.2 % (ref 11.5–15.5)
WBC: 6.8 10*3/uL (ref 4.0–10.5)

## 2019-06-23 LAB — COMPREHENSIVE METABOLIC PANEL
ALT: 31 U/L (ref 0–35)
AST: 27 U/L (ref 0–37)
Albumin: 3.9 g/dL (ref 3.5–5.2)
Alkaline Phosphatase: 112 U/L (ref 39–117)
BUN: 18 mg/dL (ref 6–23)
CO2: 30 mEq/L (ref 19–32)
Calcium: 9.4 mg/dL (ref 8.4–10.5)
Chloride: 102 mEq/L (ref 96–112)
Creatinine, Ser: 0.78 mg/dL (ref 0.40–1.20)
GFR: 73.19 mL/min (ref >60.00)
Glucose, Bld: 104 mg/dL — ABNORMAL HIGH (ref 70–99)
Potassium: 3.8 mEq/L (ref 3.5–5.1)
Sodium: 139 mEq/L (ref 135–145)
Total Bilirubin: 0.5 mg/dL (ref 0.2–1.2)
Total Protein: 6.9 g/dL (ref 6.0–8.3)

## 2019-06-23 LAB — TROPONIN I (HIGH SENSITIVITY): High Sens Troponin I: 7 ng/L (ref 2–17)

## 2019-06-23 LAB — HEMOGLOBIN A1C: Hgb A1c MFr Bld: 5.9 % (ref 4.6–6.5)

## 2019-06-23 MED ORDER — METHOCARBAMOL 500 MG PO TABS: 500.0000 mg | ORAL_TABLET | Freq: Three times a day (TID) | ORAL | 0 refills | Status: DC | PRN

## 2019-07-02 ENCOUNTER — Encounter: Payer: Self-pay | Admitting: Family Medicine

## 2019-07-08 ENCOUNTER — Encounter: Payer: Self-pay | Admitting: Family Medicine

## 2019-07-09 ENCOUNTER — Other Ambulatory Visit: Payer: Self-pay | Admitting: Cardiology

## 2019-07-16 ENCOUNTER — Other Ambulatory Visit: Payer: Self-pay | Admitting: Physician Assistant

## 2019-07-16 DIAGNOSIS — R110 Nausea: Secondary | ICD-10-CM

## 2019-07-16 DIAGNOSIS — K219 Gastro-esophageal reflux disease without esophagitis: Secondary | ICD-10-CM | POA: Diagnosis not present

## 2019-07-16 DIAGNOSIS — R1084 Generalized abdominal pain: Secondary | ICD-10-CM | POA: Diagnosis not present

## 2019-07-16 DIAGNOSIS — K5909 Other constipation: Secondary | ICD-10-CM | POA: Diagnosis not present

## 2019-07-21 ENCOUNTER — Ambulatory Visit
Admission: RE | Admit: 2019-07-21 | Discharge: 2019-07-21 | Disposition: A | Discharge status: Home / Self Care | Payer: Medicare Other | Source: Ambulatory Visit | Attending: Physician Assistant | Admitting: Physician Assistant

## 2019-07-21 DIAGNOSIS — K760 Fatty (change of) liver, not elsewhere classified: Secondary | ICD-10-CM | POA: Diagnosis not present

## 2019-07-21 DIAGNOSIS — R1084 Generalized abdominal pain: Secondary | ICD-10-CM

## 2019-07-21 DIAGNOSIS — R110 Nausea: Secondary | ICD-10-CM

## 2019-07-22 ENCOUNTER — Encounter: Payer: Self-pay | Admitting: Family Medicine

## 2019-07-22 DIAGNOSIS — F320 Major depressive disorder, single episode, mild: Secondary | ICD-10-CM

## 2019-07-22 MED ORDER — FLUOXETINE HCL 40 MG PO CAPS: 40.0000 mg | ORAL_CAPSULE | Freq: Two times a day (BID) | ORAL | 3 refills | Status: DC

## 2019-07-31 ENCOUNTER — Encounter: Payer: Self-pay | Admitting: Family Medicine

## 2019-08-04 ENCOUNTER — Other Ambulatory Visit: Payer: Self-pay | Admitting: Family Medicine

## 2019-08-04 DIAGNOSIS — R002 Palpitations: Secondary | ICD-10-CM

## 2019-08-05 ENCOUNTER — Encounter: Payer: Self-pay | Admitting: Family Medicine

## 2019-08-06 DIAGNOSIS — M25511 Pain in right shoulder: Secondary | ICD-10-CM | POA: Diagnosis not present

## 2019-08-11 ENCOUNTER — Other Ambulatory Visit: Payer: Self-pay | Admitting: Family Medicine

## 2019-08-11 DIAGNOSIS — I480 Paroxysmal atrial fibrillation: Secondary | ICD-10-CM

## 2019-08-11 MED ORDER — AMLODIPINE BESYLATE 5 MG PO TABS: 5.0000 mg | ORAL_TABLET | Freq: Every day | ORAL | 3 refills | Status: DC

## 2019-08-11 NOTE — Progress Notes (Signed)
Called her to get her OSA pressure setting- she thinks it is 5-15 Also needs amlodipine refill

## 2019-08-13 ENCOUNTER — Encounter (HOSPITAL_BASED_OUTPATIENT_CLINIC_OR_DEPARTMENT_OTHER): Payer: Self-pay

## 2019-08-13 ENCOUNTER — Emergency Department (HOSPITAL_BASED_OUTPATIENT_CLINIC_OR_DEPARTMENT_OTHER)
Admission: EM | Admit: 2019-08-13 | Discharge: 2019-08-13 | Disposition: A | Discharge status: Home / Self Care | Payer: Medicare Other | Attending: Emergency Medicine | Admitting: Emergency Medicine

## 2019-08-13 ENCOUNTER — Emergency Department (HOSPITAL_BASED_OUTPATIENT_CLINIC_OR_DEPARTMENT_OTHER): Payer: Medicare Other

## 2019-08-13 ENCOUNTER — Other Ambulatory Visit: Payer: Self-pay

## 2019-08-13 DIAGNOSIS — S0990XA Unspecified injury of head, initial encounter: Secondary | ICD-10-CM | POA: Insufficient documentation

## 2019-08-13 DIAGNOSIS — I4891 Unspecified atrial fibrillation: Secondary | ICD-10-CM | POA: Diagnosis not present

## 2019-08-13 DIAGNOSIS — Y92003 Bedroom of unspecified non-institutional (private) residence as the place of occurrence of the external cause: Secondary | ICD-10-CM | POA: Insufficient documentation

## 2019-08-13 DIAGNOSIS — Z7901 Long term (current) use of anticoagulants: Secondary | ICD-10-CM | POA: Insufficient documentation

## 2019-08-13 DIAGNOSIS — E785 Hyperlipidemia, unspecified: Secondary | ICD-10-CM | POA: Insufficient documentation

## 2019-08-13 DIAGNOSIS — W010XXA Fall on same level from slipping, tripping and stumbling without subsequent striking against object, initial encounter: Secondary | ICD-10-CM | POA: Diagnosis not present

## 2019-08-13 DIAGNOSIS — W19XXXA Unspecified fall, initial encounter: Secondary | ICD-10-CM

## 2019-08-13 DIAGNOSIS — I10 Essential (primary) hypertension: Secondary | ICD-10-CM | POA: Diagnosis not present

## 2019-08-13 DIAGNOSIS — Y998 Other external cause status: Secondary | ICD-10-CM | POA: Insufficient documentation

## 2019-08-13 DIAGNOSIS — Y9389 Activity, other specified: Secondary | ICD-10-CM | POA: Diagnosis not present

## 2019-08-13 DIAGNOSIS — Z79899 Other long term (current) drug therapy: Secondary | ICD-10-CM | POA: Diagnosis not present

## 2019-08-13 DIAGNOSIS — R519 Headache, unspecified: Secondary | ICD-10-CM | POA: Diagnosis not present

## 2019-08-13 LAB — COMPREHENSIVE METABOLIC PANEL
ALT: 28 U/L (ref 0–44)
AST: 24 U/L (ref 15–41)
Albumin: 3.8 g/dL (ref 3.5–5.0)
Alkaline Phosphatase: 132 U/L — ABNORMAL HIGH (ref 38–126)
Anion gap: 7 (ref 5–15)
BUN: 23 mg/dL (ref 8–23)
CO2: 25 mmol/L (ref 22–32)
Calcium: 9 mg/dL (ref 8.9–10.3)
Chloride: 105 mmol/L (ref 98–111)
Creatinine, Ser: 0.76 mg/dL (ref 0.44–1.00)
GFR calc Af Amer: >60 mL/min (ref >60)
GFR calc non Af Amer: >60 mL/min (ref >60)
Glucose, Bld: 120 mg/dL — ABNORMAL HIGH (ref 70–99)
Potassium: 3.6 mmol/L (ref 3.5–5.1)
Sodium: 137 mmol/L (ref 135–145)
Total Bilirubin: 1 mg/dL (ref 0.3–1.2)
Total Protein: 7.1 g/dL (ref 6.5–8.1)

## 2019-08-13 LAB — CBC WITH DIFFERENTIAL/PLATELET
Abs Immature Granulocytes: 0.04 10*3/uL (ref 0.00–0.07)
Basophils Absolute: 0.1 10*3/uL (ref 0.0–0.1)
Basophils Relative: 1 %
Eosinophils Absolute: 0.1 10*3/uL (ref 0.0–0.5)
Eosinophils Relative: 1 %
HCT: 44.3 % (ref 36.0–46.0)
Hemoglobin: 14.2 g/dL (ref 12.0–15.0)
Immature Granulocytes: 0 %
Lymphocytes Relative: 13 %
Lymphs Abs: 1.5 10*3/uL (ref 0.7–4.0)
MCH: 28.9 pg (ref 26.0–34.0)
MCHC: 32.1 g/dL (ref 30.0–36.0)
MCV: 90.2 fL (ref 80.0–100.0)
Monocytes Absolute: 0.9 10*3/uL (ref 0.1–1.0)
Monocytes Relative: 8 %
Neutro Abs: 9 10*3/uL — ABNORMAL HIGH (ref 1.7–7.7)
Neutrophils Relative %: 77 %
Platelets: 280 10*3/uL (ref 150–400)
RBC: 4.91 MIL/uL (ref 3.87–5.11)
RDW: 13.2 % (ref 11.5–15.5)
WBC: 11.6 10*3/uL — ABNORMAL HIGH (ref 4.0–10.5)
nRBC: 0 % (ref 0.0–0.2)

## 2019-08-13 LAB — URINALYSIS, ROUTINE W REFLEX MICROSCOPIC
Bilirubin Urine: NEGATIVE
Glucose, UA: NEGATIVE mg/dL
Hgb urine dipstick: NEGATIVE
Ketones, ur: NEGATIVE mg/dL
Leukocytes,Ua: NEGATIVE
Nitrite: NEGATIVE
Protein, ur: NEGATIVE mg/dL
Specific Gravity, Urine: 1.015 (ref 1.005–1.030)
pH: 6 (ref 5.0–8.0)

## 2019-08-13 LAB — TROPONIN I (HIGH SENSITIVITY): Troponin I (High Sensitivity): 2 ng/L (ref <18)

## 2019-08-13 MED ORDER — ACETAMINOPHEN 325 MG PO TABS
650.0000 mg | ORAL_TABLET | Freq: Once | ORAL | Status: AC
  Administered 2019-08-13: 650 mg via ORAL
  Filled 2019-08-13: qty 2

## 2019-08-13 NOTE — ED Provider Notes (Signed)
Patient taken in sign out from [place]. Patient on blood thinners. Labs are not of concern. Awaiting ct head.    4:26 PM I personally reviewed the ct head which shows no acute abnormality on my interpretation. patietn appears appropriate for discharge at this time.    Margarita Mail, PA-C 08/13/19 1628    Margette Fast, MD 08/16/19 1309

## 2019-08-13 NOTE — ED Triage Notes (Signed)
Pt states she fell in her bedroom ~1 hour PTA-she struck the back of her head on the wall-no break in skin noted-no LOC-states she is on blood thinners and pain to right sholuder-NAD-steady gait

## 2019-08-13 NOTE — Discharge Instructions (Signed)
Get help right away if: °You have: °A severe headache that is not helped by medicine. °Trouble walking or weakness in your arms and legs. °Clear or bloody fluid coming from your nose or ears. °Changes in your vision. °A seizure. °You lose your balance. °You vomit. °Your pupils change size. °Your speech is slurred. °Your dizziness gets worse. °You faint. °You are sleepier than normal and have trouble staying awake. °Your symptoms get worse. °

## 2019-08-13 NOTE — ED Notes (Signed)
Patient transported to CT 

## 2019-08-13 NOTE — ED Provider Notes (Signed)
[facility] EMERGENCY DEPARTMENT Provider Note   CSN: [id number] Arrival date & time: 08/13/19  1414     History   Chief Complaint Chief Complaint  Patient presents with  . Fall    HPI Lisa Acosta is a 68 y.o. female presenting for evaluation after a fall.  Patient states she was in her bedroom about to take a nap when she lost her balance and fell, hitting the right side of her head and right side of her body.  She denies loss of consciousness.  She denies chest pain or shortness of breath prior to the fall.  This occurred about 1 hour prior to arrival.  She has ambulated since.  She reports mild soreness of her right shoulder, elbow, and hip.  She mostly has a right-sided headache which is constant.  She has not taken anything for this including Tylenol.  She is on Xarelto, has been taking as prescribed.  This is why she came to the ED.  Seen states she has chronic dizziness, no significant difference now, however she has remained with intermittent dizziness since the fall.   Additional history obtained from chart review.  Patient with a history of A. fib on blood thinners, depression, GERD, hyperlipidemia, hypertension, hypothyroidism.      HPI  Past Medical History:  Diagnosis Date  . Atrial fibrillation ([place])   . Depression   . Diverticulitis   . Diverticulosis   . Esophageal ulcer   . Gait abnormality 10/14/2018  . GERD (gastroesophageal reflux disease)   . Hyperlipidemia   . Hypertension   . Hypothyroid   . OSA (obstructive sleep apnea) 08/31/2016  . Osteopenia   . 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 Right wrist pain 05/04/2019  . Mild episode of recurrent major depressive disorder ([place]) 12/11/2018  . Depression, major, single episode, mild ([place]) 10/22/2018  . Gait abnormality 10/14/2018  . 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 Moderate episode of recurrent major depressive  disorder ([place]) 09/26/2018  . Generalized anxiety disorder 09/26/2018  . Osteopenia 01/25/2018  . 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 Dyspnea 09/22/2016  . OSA (obstructive sleep apnea) 08/31/2016  . PAF (paroxysmal atrial fibrillation) ([place]) 03/16/2016  . Hypothyroid 03/16/2016  . Obesity (BMI 30-39.9) 03/16/2016  . Essential hypertension 11/10/2015  . Hyperlipidemia 11/10/2015    Past Surgical History:  Procedure Laterality Date  . BREAST BIOPSY Left    needle core biopsy, benign  . CATARACT EXTRACTION    . HAND SURGERY    . KNEE SURGERY    . ROTATOR CUFF REPAIR    . TOTAL ABDOMINAL HYSTERECTOMY       OB History   No obstetric history on file.      Home Medications    Prior to Admission medications   Medication Sig Start Date End Date Taking? Authorizing Provider  amLODipine (NORVASC) 5 MG tablet Take 1 tablet (5 mg total) by mouth daily. 08/11/19   Copland, Gay Filler, MD  atenolol (TENORMIN) 50 MG tablet TAKE 1 TABLET DAILY 08/04/19   Copland, Gay Filler, MD  BIOTIN PO Take 1 tablet by mouth daily.    [provider]  Calcium Carb-Cholecalciferol (CALCIUM 600 + D PO) Take by mouth.    [provider]  flecainide (TAMBOCOR) 100 MG tablet TAKE 1 TABLET TWICE A DAY 10/16/18   Copland, Gay Filler, MD  FLUoxetine (PROZAC) 40 MG capsule Take 1 capsule (  40 mg total) by mouth 2 (two) times daily. 07/22/19 10/20/19  Copland, Gay Filler, MD  Krill Oil 1000 MG CAPS Take 1 capsule by mouth daily.    [provider]  lactobacillus acidophilus (BACID) TABS tablet Take 1 tablet by mouth daily.    [provider]  levothyroxine (SYNTHROID, LEVOTHROID) 125 MCG tablet Take 1 tablet (125 mcg total) by mouth daily before breakfast. 10/16/18   Copland, Gay Filler, MD  lovastatin (MEVACOR) 40 MG tablet TAKE 1 TABLET BY MOUTH EVERYDAY AT BEDTIME 07/09/19   Lelon Perla, MD  LUTEIN PO Take 1 capsule by mouth daily.    [provider]   magnesium 30 MG tablet Take 30 mg by mouth daily.    [provider]  methocarbamol (ROBAXIN) 500 MG tablet Take 1 tablet (500 mg total) by mouth every 8 (eight) hours as needed for muscle spasms. 06/23/19   Copland, Gay Filler, MD  omeprazole (PRILOSEC) 40 MG capsule Take 1 capsule (40 mg total) by mouth 2 (two) times daily. 02/19/19   Copland, Gay Filler, MD  Potassium 75 MG TABS Take 1 tablet by mouth daily.    [provider]  sucralfate (CARAFATE) 1 g tablet Take 1 g by mouth 2 (two) times daily.    [provider]  traZODone (DESYREL) 50 MG tablet Take 1.5 tablets (75 mg total) by mouth at bedtime. 12/11/18 03/11/19  Pucilowski, Marchia Bond, MD  Turmeric 450 MG CAPS Take 1 capsule by mouth daily.    [provider]  XARELTO 20 MG TABS tablet TAKE 1 TABLET DAILY WITH   SUPPER 11/11/18   Copland, Gay Filler, MD    Family History Family History  Problem Relation Age of Onset  . Atrial fibrillation Mother   . Congestive Heart Failure Mother   . Depression Mother   . Heart disease Father   . Alcohol abuse Father   . Heart disease Brother   . Heart disease Brother   . Depression Sister   . Depression Daughter     Social History Social History   Tobacco Use  . Smoking status: Never Smoker  . Smokeless tobacco: Never Used  Substance Use Topics  . Alcohol use: Yes    Comment: rare  . Drug use: No     Allergies   Dextran, Dextrans, Dextromethorphan hbr, Tree extract, Penicillin g, Penicillins, Sulfa antibiotics, and Sulfacetamide sodium   Review of Systems Review of Systems  Musculoskeletal: Positive for myalgias.  Neurological: Positive for headaches.  Hematological: Bruises/bleeds easily.  All other systems reviewed and are negative.    Physical Exam Updated Vital Signs BP (!) 143/62 (BP Location: Right Arm)   Pulse (!) 53   Temp 98.5 F (36.9 C) (Oral)   Resp 18   Ht _0  (1.549 m)   Wt 88 kg   SpO2 98%   BMI 36.66 kg/m    Physical Exam Vitals signs and nursing note reviewed.  Constitutional:      General: She is not in acute distress.    Appearance: She is well-developed.     Comments: Appears nontoxic  HENT:     Head: Normocephalic.      Comments: Tenderness palpation of the right parietal skull.  No laceration or obvious hematoma Eyes:     Extraocular Movements: Extraocular movements intact.     Conjunctiva/sclera: Conjunctivae normal.     Pupils: Pupils are equal, round, and reactive to light.     Comments: EOMI and PERRLA.  No nystagmus  Neck:     Musculoskeletal: Normal range of motion and neck supple.     Comments: No tenderness palpation of midline C-spine.  No step-offs or deformities.  Full active range of motion of the head without difficulty. Cardiovascular:     Rate and Rhythm: Normal rate and regular rhythm.     Pulses: Normal pulses.  Pulmonary:     Effort: No respiratory distress.     Breath sounds: Normal breath sounds. No wheezing.  Abdominal:     General: There is no distension.     Palpations: Abdomen is soft. There is no mass.     Tenderness: There is no abdominal tenderness. There is no guarding or rebound.  Musculoskeletal: Normal range of motion.        General: Tenderness present.     Comments: Mild tenderness palpation of the right shoulder and scapula.  No obvious deformity.  No contusions.  No tenderness palpation of back or midline spine.  No step-offs or deformities.  No tenderness palpation of the hips, pelvis intact.  Ambulatory with out difficulty.  No obvious deformity of the upper or lower extremity.  Radial pedal pulses intact x4.  Strength and sensation intact x4.  Skin:    General: Skin is warm and dry.     Capillary Refill: Capillary refill takes less than 2 seconds.  Neurological:     Mental Status: She is alert and oriented to person, place, and time.     Comments: CN intact.  Nose to finger intact.  No weakness noted.  No obvious neurologic deficit.       ED Treatments / Results  Labs (all labs ordered are listed, but only abnormal results are displayed) Labs Reviewed  CBC WITH DIFFERENTIAL/PLATELET - Abnormal; Notable for the following components:      Result Value   WBC 11.6 (*)    Neutro Abs 9.0 (*)    All other components within normal limits  COMPREHENSIVE METABOLIC PANEL - Abnormal; Notable for the following components:   Glucose, Bld 120 (*)    Alkaline Phosphatase 132 (*)    All other components within normal limits  URINALYSIS, ROUTINE W REFLEX MICROSCOPIC  TROPONIN I (HIGH SENSITIVITY)    EKG None  Radiology No results found.  Procedures Procedures (including critical care time)  Medications Ordered in ED Medications  acetaminophen (TYLENOL) tablet 650 mg (has no administration in time range)     Initial Impression / Assessment and Plan / ED Course  I have reviewed the triage vital signs and the nursing notes.  Pertinent labs & imaging results that were available during my care of the patient were reviewed by me and considered in my medical decision making (see chart for details).        Patient presenting for evaluation after fall.  Physical examination, she appears nontoxic.  She is neurovascularly intact.  As she is on blood thinners, will obtain CT of the head.  Patient without any neck pain, I do not believe she needs a CT C-spine at this time.  Patient has had continued intermittent dizziness, although does have a history of this.  Discussed option of further investigation with blood work, UA, and EKG, patient would like to go ahead and do this.  Discussed option of imaging of the right shoulder due to her tenderness, patient declines.  Labs reassuring.  Nonspecific leukocytosis at 11.6, no infectious symptoms or concern for infection at this time.  Alk phos mildly elevated at  132, nonspecific.  Troponin normal at 2.  Doubt ACS, as patient with chest pain.  EKG mildly bradycardic, baseline for patient.  CT  head pending.  Patient signed out to a Harris, PA-C for follow-up on CT head.  If normal, patient can be discharged.   Final Clinical Impressions(s) / ED Diagnoses   Final diagnoses:  None    ED Discharge Orders    None       Franchot Heidelberg, PA-C 08/13/19 1611    Long, Wonda Olds, MD 08/16/19 1310

## 2019-08-18 ENCOUNTER — Encounter: Payer: Self-pay | Admitting: Neurology

## 2019-08-18 ENCOUNTER — Ambulatory Visit (INDEPENDENT_AMBULATORY_CARE_PROVIDER_SITE_OTHER): Payer: Medicare Other | Admitting: Neurology

## 2019-08-18 ENCOUNTER — Other Ambulatory Visit: Payer: Self-pay

## 2019-08-18 VITALS — BP 155/76 | HR 52 | Temp 97.1°F | Ht 61.0 in | Wt 192.3 lb

## 2019-08-18 DIAGNOSIS — R269 Unspecified abnormalities of gait and mobility: Secondary | ICD-10-CM

## 2019-08-18 DIAGNOSIS — R413 Other amnesia: Secondary | ICD-10-CM | POA: Diagnosis not present

## 2019-08-18 HISTORY — DX: Other amnesia: R41.3

## 2019-08-18 NOTE — Progress Notes (Signed)
Reason for visit: Gait disorder, falls  Lisa Acosta is an 68 y.o. female  History of present illness:  Lisa Acosta is a 68 year old right-handed white female with a history of atrial fibrillation on anticoagulation.  The patient does have a mild degree of small vessel disease by MRI of the brain, she has no evidence of a peripheral neuropathy that would lead to balance problems but she continues to fall.  She has gone through physical therapy which seemed to help but she has not continued the balance exercise training.  She has fallen twice in the last week or 2, she went to the emergency room after she bumped her head on 13 August 2019.  The patient has had a recent injury of her right shoulder, she will be going for MRI of the shoulder in the near future.  She also reports some changes in memory, she has at times had word finding problems, she may get somewhat turned around when she is driving.  She may misplace things about the house.  She has 2 brothers and a sister, she is not aware that any of them have any memory problems, but her mother had dementia prior to her death.  She is concerned about the memory issues.  The patient uses a cane for ambulation.  She does not usually use the cane inside the house, this is where most of her falls occur.  Past Medical History:  Diagnosis Date  . Atrial fibrillation ([place])   . Depression   . Diverticulitis   . Diverticulosis   . Esophageal ulcer   . Gait abnormality 10/14/2018  . GERD (gastroesophageal reflux disease)   . Hyperlipidemia   . Hypertension   . Hypothyroid   . OSA (obstructive sleep apnea) 08/31/2016  . Osteopenia   . 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 BREAST BIOPSY Left    needle core biopsy, benign  . CATARACT EXTRACTION    . HAND SURGERY    . KNEE SURGERY    . ROTATOR CUFF REPAIR    . TOTAL ABDOMINAL HYSTERECTOMY      Family History  Problem  Relation Age of Onset  . Atrial fibrillation Mother   . Congestive Heart Failure Mother   . Depression Mother   . Heart disease Father   . Alcohol abuse Father   . Heart disease Brother   . Heart disease Brother   . Depression Sister   . Depression Daughter     Social history:  reports that she has never smoked. She has never used smokeless tobacco. She reports current alcohol use. She reports that she does not use drugs.    Allergies  Allergen Reactions  . Dextran Anaphylaxis  . Dextrans   . Dextromethorphan Hbr Other (See Comments)    hallucinations  . Tree Extract   . Penicillin G Rash  . Penicillins Rash  . Sulfa Antibiotics Rash  . Sulfacetamide Sodium Rash    Medications:  Prior to Admission medications   Medication Sig Start Date End Date Taking? Authorizing Provider  amLODipine (NORVASC) 5 MG tablet Take 1 tablet (5 mg total) by mouth daily. 08/11/19  Yes Copland, Gay Filler, MD  atenolol (TENORMIN) 50 MG tablet TAKE 1 TABLET DAILY 08/04/19  Yes Copland, Gay Filler, MD  BIOTIN PO Take 1 tablet by mouth daily.   Yes [provider]  Calcium Carb-Cholecalciferol (CALCIUM 600 + D PO) Take by mouth.  Yes [provider]  flecainide (TAMBOCOR) 100 MG tablet TAKE 1 TABLET TWICE A DAY 10/16/18  Yes Copland, Gay Filler, MD  FLUoxetine (PROZAC) 40 MG capsule Take 1 capsule (40 mg total) by mouth 2 (two) times daily. 07/22/19 10/20/19 Yes Copland, Gay Filler, MD  Krill Oil 1000 MG CAPS Take 1 capsule by mouth daily.   Yes [provider]  lactobacillus acidophilus (BACID) TABS tablet Take 1 tablet by mouth daily.   Yes [provider]  levothyroxine (SYNTHROID, LEVOTHROID) 125 MCG tablet Take 1 tablet (125 mcg total) by mouth daily before breakfast. 10/16/18  Yes Copland, Gay Filler, MD  lovastatin (MEVACOR) 40 MG tablet TAKE 1 TABLET BY MOUTH EVERYDAY AT BEDTIME 07/09/19  Yes Crenshaw, Denice Bors, MD  LUTEIN PO Take 1 capsule by mouth daily.   Yes  [provider]  magnesium 30 MG tablet Take 30 mg by mouth daily.   Yes [provider]  methocarbamol (ROBAXIN) 500 MG tablet Take 1 tablet (500 mg total) by mouth every 8 (eight) hours as needed for muscle spasms. 06/23/19  Yes Copland, Gay Filler, MD  omeprazole (PRILOSEC) 40 MG capsule Take 1 capsule (40 mg total) by mouth 2 (two) times daily. 02/19/19  Yes Copland, Gay Filler, MD  Potassium 75 MG TABS Take 1 tablet by mouth daily.   Yes [provider]  sucralfate (CARAFATE) 1 g tablet Take 1 g by mouth 2 (two) times daily.   Yes [provider]  Turmeric 450 MG CAPS Take 1 capsule by mouth daily.   Yes [provider]  XARELTO 20 MG TABS tablet TAKE 1 TABLET DAILY WITH   SUPPER 11/11/18  Yes Copland, Gay Filler, MD  traZODone (DESYREL) 50 MG tablet Take 1.5 tablets (75 mg total) by mouth at bedtime. 12/11/18 03/11/19  Pucilowski, Marchia Bond, MD    ROS:  Out of a complete 14 system review of symptoms, the patient complains only of the following symptoms, and all other reviewed systems are negative.  Walking problem, falls Memory troubles Right shoulder pain  Blood pressure (!) 155/76, pulse (!) 52, temperature (!) 97.1 F (36.2 C), temperature source Temporal, height 5\' 1"  (1.549 m), weight 192 lb 5 oz (87.2 kg).   Blood pressure, right arm, sitting is 158/82.  Blood pressure, right arm, standing is 156/80.  Physical Exam  General: The patient is alert and cooperative at the time of the examination.  The patient is moderately obese.  Skin: No significant peripheral edema is noted.   Neurologic Exam  Mental status: The patient is alert and oriented x 3 at the time of the examination. The patient has apparent normal recent and remote memory, with an apparently normal attention span and concentration ability.  Mini-Mental status examination done today shows a total score of 30/30.   Cranial nerves: Facial symmetry is present. Speech is normal,  no aphasia or dysarthria is noted. Extraocular movements are full. Visual fields are full.  Motor: The patient has good strength in all 4 extremities.  Sensory examination: Soft touch sensation is symmetric on the face, arms, and legs.  Coordination: The patient has good finger-nose-finger and heel-to-shin bilaterally.  Gait and station: The patient has a normal gait.  The patient usually walks with a cane.  Tandem gait is unsteady. Romberg is negative. No drift is seen.  Reflexes: Deep tendon reflexes are symmetric.   CT head 08/13/19:  IMPRESSION: 1. No acute intracranial abnormality. No skull fracture. 2. Stable mild atrophy  and chronic small vessel ischemia.  * CT scan images were reviewed online. I agree with the written report.   MRI brain 03/06/19:  IMPRESSION: This MRI of the brain without contrast shows the following: 1. There are T2/flair hyperintense foci in the subcortical and deep white matter both hemispheres and in the right pons. The pattern is most consistent with chronic microvascular ischemic change. None of the foci appear to be acute. 2. There are no acute findings.  * MRI scan images were reviewed online. I agree with the written report.     Assessment/Plan:  1.  Gait disturbance, episodic falls  2.  Reported memory disturbance  The etiology of the gait problems is not clear.  With walking, the patient appears to be fairly normal but she does have some gait instability with tandem gait.  She will fall without warning at times.  No evidence of orthostasis is seen today on clinical examination.  The patient will engage in her balance training exercises.  We will follow the memory issues over time, we may add a memory medication in the future if she continues to have problem with progressive changes in cognitive functioning.  She will follow-up here in 6 months.  Jill Alexanders MD 08/18/2019 10:56 AM  [facility] [address] [place], [place] [address]  Phone [phone] Fax [phone]

## 2019-08-19 ENCOUNTER — Telehealth: Payer: Self-pay

## 2019-08-19 DIAGNOSIS — M25511 Pain in right shoulder: Secondary | ICD-10-CM | POA: Diagnosis not present

## 2019-08-19 NOTE — Telephone Encounter (Signed)
Copied from [place] [id number]. Topic: General - Other >> Aug 18, 2019  4:10 PM Keene Breath wrote: Reason for CRM: Called to request that the doctor initial and date the corrected address change on the form.  For questions, please call at [phone]

## 2019-08-20 ENCOUNTER — Encounter: Payer: Self-pay | Admitting: Family Medicine

## 2019-08-20 ENCOUNTER — Other Ambulatory Visit: Payer: Self-pay | Admitting: Gastroenterology

## 2019-08-20 DIAGNOSIS — E034 Atrophy of thyroid (acquired): Secondary | ICD-10-CM

## 2019-08-20 DIAGNOSIS — R1030 Lower abdominal pain, unspecified: Secondary | ICD-10-CM | POA: Diagnosis not present

## 2019-08-20 DIAGNOSIS — R112 Nausea with vomiting, unspecified: Secondary | ICD-10-CM | POA: Diagnosis not present

## 2019-08-21 MED ORDER — LEVOTHYROXINE SODIUM 125 MCG PO TABS: 125.0000 ug | ORAL_TABLET | Freq: Every day | ORAL | 3 refills | Status: DC

## 2019-08-22 DIAGNOSIS — R112 Nausea with vomiting, unspecified: Secondary | ICD-10-CM | POA: Diagnosis not present

## 2019-08-22 DIAGNOSIS — R1030 Lower abdominal pain, unspecified: Secondary | ICD-10-CM | POA: Diagnosis not present

## 2019-08-26 ENCOUNTER — Other Ambulatory Visit: Payer: Self-pay | Admitting: Family Medicine

## 2019-08-26 DIAGNOSIS — H43812 Vitreous degeneration, left eye: Secondary | ICD-10-CM | POA: Diagnosis not present

## 2019-08-26 DIAGNOSIS — R002 Palpitations: Secondary | ICD-10-CM

## 2019-08-26 DIAGNOSIS — H3582 Retinal ischemia: Secondary | ICD-10-CM | POA: Diagnosis not present

## 2019-08-26 DIAGNOSIS — H348312 Tributary (branch) retinal vein occlusion, right eye, stable: Secondary | ICD-10-CM | POA: Diagnosis not present

## 2019-08-26 DIAGNOSIS — E034 Atrophy of thyroid (acquired): Secondary | ICD-10-CM

## 2019-08-26 MED ORDER — ATENOLOL 50 MG PO TABS: 50.0000 mg | ORAL_TABLET | Freq: Every day | ORAL | 0 refills | Status: DC

## 2019-08-26 MED ORDER — LEVOTHYROXINE SODIUM 125 MCG PO TABS: 125.0000 ug | ORAL_TABLET | Freq: Every day | ORAL | 0 refills | Status: DC

## 2019-08-26 NOTE — Telephone Encounter (Signed)
Medication Refill - Medication: atenolol (TENORMIN) 50 MG tablet [id number]  levothyroxine (SYNTHROID) 125 MCG tablet [id number]     Preferred Pharmacy (with phone number or street name): [address] [place] [address] Phone: [phone]  Agent: Please be advised that RX refills may take up to 3 business days. We ask that you follow-up with your pharmacy.

## 2019-08-26 NOTE — Telephone Encounter (Signed)
PT HAS CALLED AGAIN STATING THAT NEEDS BADLY TONITE

## 2019-08-29 ENCOUNTER — Ambulatory Visit
Admission: RE | Admit: 2019-08-29 | Discharge: 2019-08-29 | Disposition: A | Discharge status: Home / Self Care | Payer: Medicare Other | Source: Ambulatory Visit | Attending: Gastroenterology | Admitting: Gastroenterology

## 2019-08-29 DIAGNOSIS — R112 Nausea with vomiting, unspecified: Secondary | ICD-10-CM

## 2019-08-29 DIAGNOSIS — R1030 Lower abdominal pain, unspecified: Secondary | ICD-10-CM

## 2019-08-29 MED ORDER — IOPAMIDOL (ISOVUE-300) INJECTION 61%
100.0000 mL | Freq: Once | INTRAVENOUS | Status: AC | PRN
  Administered 2019-08-29: 100 mL via INTRAVENOUS

## 2019-09-24 ENCOUNTER — Encounter (HOSPITAL_BASED_OUTPATIENT_CLINIC_OR_DEPARTMENT_OTHER): Payer: Self-pay | Admitting: *Deleted

## 2019-09-24 ENCOUNTER — Emergency Department (HOSPITAL_BASED_OUTPATIENT_CLINIC_OR_DEPARTMENT_OTHER): Payer: PPO

## 2019-09-24 ENCOUNTER — Other Ambulatory Visit: Payer: Self-pay

## 2019-09-24 ENCOUNTER — Emergency Department (HOSPITAL_BASED_OUTPATIENT_CLINIC_OR_DEPARTMENT_OTHER)
Admission: EM | Admit: 2019-09-24 | Discharge: 2019-09-24 | Disposition: A | Discharge status: Home / Self Care | Payer: PPO | Attending: Emergency Medicine | Admitting: Emergency Medicine

## 2019-09-24 DIAGNOSIS — K5730 Diverticulosis of large intestine without perforation or abscess without bleeding: Secondary | ICD-10-CM | POA: Insufficient documentation

## 2019-09-24 DIAGNOSIS — M5431 Sciatica, right side: Secondary | ICD-10-CM | POA: Diagnosis not present

## 2019-09-24 DIAGNOSIS — M5441 Lumbago with sciatica, right side: Secondary | ICD-10-CM | POA: Diagnosis not present

## 2019-09-24 DIAGNOSIS — Z7901 Long term (current) use of anticoagulants: Secondary | ICD-10-CM | POA: Diagnosis not present

## 2019-09-24 DIAGNOSIS — I10 Essential (primary) hypertension: Secondary | ICD-10-CM | POA: Diagnosis not present

## 2019-09-24 DIAGNOSIS — R109 Unspecified abdominal pain: Secondary | ICD-10-CM | POA: Diagnosis not present

## 2019-09-24 DIAGNOSIS — M545 Low back pain: Secondary | ICD-10-CM | POA: Diagnosis not present

## 2019-09-24 DIAGNOSIS — R05 Cough: Secondary | ICD-10-CM | POA: Diagnosis not present

## 2019-09-24 DIAGNOSIS — R6883 Chills (without fever): Secondary | ICD-10-CM | POA: Insufficient documentation

## 2019-09-24 DIAGNOSIS — Z20822 Contact with and (suspected) exposure to covid-19: Secondary | ICD-10-CM | POA: Insufficient documentation

## 2019-09-24 DIAGNOSIS — J029 Acute pharyngitis, unspecified: Secondary | ICD-10-CM | POA: Diagnosis not present

## 2019-09-24 DIAGNOSIS — M25551 Pain in right hip: Secondary | ICD-10-CM | POA: Diagnosis not present

## 2019-09-24 DIAGNOSIS — I4891 Unspecified atrial fibrillation: Secondary | ICD-10-CM | POA: Diagnosis not present

## 2019-09-24 DIAGNOSIS — Z79899 Other long term (current) drug therapy: Secondary | ICD-10-CM | POA: Diagnosis not present

## 2019-09-24 DIAGNOSIS — Z03818 Encounter for observation for suspected exposure to other biological agents ruled out: Secondary | ICD-10-CM | POA: Diagnosis not present

## 2019-09-24 LAB — CBC WITH DIFFERENTIAL/PLATELET
Abs Immature Granulocytes: 0.02 10*3/uL (ref 0.00–0.07)
Basophils Absolute: 0.1 10*3/uL (ref 0.0–0.1)
Basophils Relative: 1 %
Eosinophils Absolute: 0.1 10*3/uL (ref 0.0–0.5)
Eosinophils Relative: 1 %
HCT: 41.5 % (ref 36.0–46.0)
Hemoglobin: 13.6 g/dL (ref 12.0–15.0)
Immature Granulocytes: 0 %
Lymphocytes Relative: 20 %
Lymphs Abs: 1.3 10*3/uL (ref 0.7–4.0)
MCH: 29.4 pg (ref 26.0–34.0)
MCHC: 32.8 g/dL (ref 30.0–36.0)
MCV: 89.8 fL (ref 80.0–100.0)
Monocytes Absolute: 0.6 10*3/uL (ref 0.1–1.0)
Monocytes Relative: 9 %
Neutro Abs: 4.6 10*3/uL (ref 1.7–7.7)
Neutrophils Relative %: 69 %
Platelets: 243 10*3/uL (ref 150–400)
RBC: 4.62 MIL/uL (ref 3.87–5.11)
RDW: 13.2 % (ref 11.5–15.5)
WBC: 6.7 10*3/uL (ref 4.0–10.5)
nRBC: 0 % (ref 0.0–0.2)

## 2019-09-24 LAB — BASIC METABOLIC PANEL
Anion gap: 7 (ref 5–15)
BUN: 19 mg/dL (ref 8–23)
CO2: 27 mmol/L (ref 22–32)
Calcium: 9.3 mg/dL (ref 8.9–10.3)
Chloride: 105 mmol/L (ref 98–111)
Creatinine, Ser: 0.61 mg/dL (ref 0.44–1.00)
GFR calc Af Amer: >60 mL/min (ref >60)
GFR calc non Af Amer: >60 mL/min (ref >60)
Glucose, Bld: 87 mg/dL (ref 70–99)
Potassium: 3.8 mmol/L (ref 3.5–5.1)
Sodium: 139 mmol/L (ref 135–145)

## 2019-09-24 LAB — URINALYSIS, ROUTINE W REFLEX MICROSCOPIC
Bilirubin Urine: NEGATIVE
Glucose, UA: NEGATIVE mg/dL
Hgb urine dipstick: NEGATIVE
Ketones, ur: NEGATIVE mg/dL
Leukocytes,Ua: NEGATIVE
Nitrite: NEGATIVE
Protein, ur: NEGATIVE mg/dL
Specific Gravity, Urine: 1.015 (ref 1.005–1.030)
pH: 8.5 — ABNORMAL HIGH (ref 5.0–8.0)

## 2019-09-24 LAB — SEDIMENTATION RATE: Sed Rate: 20 mm/hr (ref 0–22)

## 2019-09-24 LAB — LACTIC ACID, PLASMA: Lactic Acid, Venous: 1.1 mmol/L (ref 0.5–1.9)

## 2019-09-24 MED ORDER — OXYCODONE-ACETAMINOPHEN 5-325 MG PO TABS: 1.0000 | ORAL_TABLET | Freq: Four times a day (QID) | ORAL | 0 refills | Status: DC | PRN

## 2019-09-24 MED ORDER — MORPHINE SULFATE (PF) 4 MG/ML IV SOLN
4.0000 mg | Freq: Once | INTRAVENOUS | Status: AC
  Administered 2019-09-24: 4 mg via INTRAVENOUS
  Filled 2019-09-24: qty 1

## 2019-09-24 MED ORDER — PREDNISONE 10 MG (21) PO TBPK: ORAL_TABLET | Freq: Every day | ORAL | 0 refills | Status: DC

## 2019-09-24 MED ORDER — IOHEXOL 300 MG/ML  SOLN
100.0000 mL | Freq: Once | INTRAMUSCULAR | Status: AC | PRN
  Administered 2019-09-24: 100 mL via INTRAVENOUS

## 2019-09-24 NOTE — ED Notes (Signed)
Patient transported to X-ray 

## 2019-09-24 NOTE — ED Provider Notes (Signed)
[facility] EMERGENCY DEPARTMENT Provider Note   CSN: [id number] Arrival date & time: 09/24/19  1135     History Chief Complaint  Patient presents with  . Hip Pain    Lisa Acosta is a 69 y.o. female.  She started with low back pain.  It is now more into her right hip and thigh.  She rates it 8 out of 10 and throbbing.  Worse with ambulation.  She is also had some shaking chills today.  She said her right calf felt a little numb.  No obvious swelling.  No trauma.  She has a nonproductive cough that she blames on her blood pressure medicine and some sore throat that she says is her allergies.  No known fever.  The history is provided by the patient.  Hip Pain This is a new problem. The current episode started more than 2 days ago. The problem occurs constantly. The problem has been gradually worsening. Pertinent negatives include no chest pain, no abdominal pain, no headaches and no shortness of breath. The symptoms are aggravated by walking. Nothing relieves the symptoms. She has tried nothing for the symptoms. The treatment provided no relief.       Past Medical History:  Diagnosis Date  . Atrial fibrillation ([place])   . Depression   . Diverticulitis   . Diverticulosis   . Esophageal ulcer   . Gait abnormality 10/14/2018  . GERD (gastroesophageal reflux disease)   . Hyperlipidemia   . Hypertension   . Hypothyroid   . Memory difficulty 08/18/2019  . OSA (obstructive sleep apnea) 08/31/2016  . Osteopenia   . 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 Memory difficulty 08/18/2019  . Right wrist pain 05/04/2019  . Mild episode of recurrent major depressive disorder ([place]) 12/11/2018  . Depression, major, single episode, mild ([place]) 10/22/2018  . Gait abnormality 10/14/2018  . 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 Moderate episode of recurrent major depressive disorder ([place]) 09/26/2018  . Generalized  anxiety disorder 09/26/2018  . Osteopenia 01/25/2018  . 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 Dyspnea 09/22/2016  . OSA (obstructive sleep apnea) 08/31/2016  . PAF (paroxysmal atrial fibrillation) ([place]) 03/16/2016  . Hypothyroid 03/16/2016  . Obesity (BMI 30-39.9) 03/16/2016  . Essential hypertension 11/10/2015  . Hyperlipidemia 11/10/2015    Past Surgical History:  Procedure Laterality Date  . BREAST BIOPSY Left    needle core biopsy, benign  . CATARACT EXTRACTION    . HAND SURGERY    . KNEE SURGERY    . ROTATOR CUFF REPAIR    . TOTAL ABDOMINAL HYSTERECTOMY       OB History   No obstetric history on file.     Family History  Problem Relation Age of Onset  . Atrial fibrillation Mother   . Congestive Heart Failure Mother   . Depression Mother   . Heart disease Father   . Alcohol abuse Father   . Heart disease Brother   . Heart disease Brother   . Depression Sister   . Depression Daughter     Social History   Tobacco Use  . Smoking status: Never Smoker  . Smokeless tobacco: Never Used  Substance Use Topics  . Alcohol use: Yes    Comment: rare  . Drug use: No    Home Medications Prior to Admission medications   Medication Sig Start Date End Date Taking? Authorizing Provider  amLODipine (NORVASC) 5  MG tablet Take 1 tablet (5 mg total) by mouth daily. 08/11/19   Copland, Gay Filler, MD  atenolol (TENORMIN) 50 MG tablet Take 1 tablet (50 mg total) by mouth daily. 08/26/19   Copland, Gay Filler, MD  BIOTIN PO Take 1 tablet by mouth daily.    [provider]  Calcium Carb-Cholecalciferol (CALCIUM 600 + D PO) Take by mouth.    [provider]  flecainide (TAMBOCOR) 100 MG tablet TAKE 1 TABLET TWICE A DAY 10/16/18   Copland, Gay Filler, MD  FLUoxetine (PROZAC) 40 MG capsule Take 1 capsule (40 mg total) by mouth 2 (two) times daily. 07/22/19 10/20/19  Copland, Gay Filler, MD  Krill Oil 1000 MG CAPS Take 1 capsule by mouth daily.    [provider]  lactobacillus acidophilus (BACID) TABS tablet Take 1 tablet by mouth daily.    [provider]  levothyroxine (SYNTHROID) 125 MCG tablet Take 1 tablet (125 mcg total) by mouth daily before breakfast. 08/26/19   Copland, Gay Filler, MD  lovastatin (MEVACOR) 40 MG tablet TAKE 1 TABLET BY MOUTH EVERYDAY AT BEDTIME 07/09/19   Lelon Perla, MD  LUTEIN PO Take 1 capsule by mouth daily.    [provider]  magnesium 30 MG tablet Take 30 mg by mouth daily.    [provider]  methocarbamol (ROBAXIN) 500 MG tablet Take 1 tablet (500 mg total) by mouth every 8 (eight) hours as needed for muscle spasms. 06/23/19   Copland, Gay Filler, MD  omeprazole (PRILOSEC) 40 MG capsule Take 1 capsule (40 mg total) by mouth 2 (two) times daily. 02/19/19   Copland, Gay Filler, MD  Potassium 75 MG TABS Take 1 tablet by mouth daily.    [provider]  sucralfate (CARAFATE) 1 g tablet Take 1 g by mouth 2 (two) times daily.    [provider]  traZODone (DESYREL) 50 MG tablet Take 1.5 tablets (75 mg total) by mouth at bedtime. 12/11/18 03/11/19  Pucilowski, Marchia Bond, MD  Turmeric 450 MG CAPS Take 1 capsule by mouth daily.    [provider]  XARELTO 20 MG TABS tablet TAKE 1 TABLET DAILY WITH   SUPPER 11/11/18   Copland, Gay Filler, MD    Allergies    Dextran, Dextrans, Dextromethorphan hbr, Tree extract, Penicillin g, Penicillins, Sulfa antibiotics, and Sulfacetamide sodium  Review of Systems   Review of Systems  Constitutional: Positive for chills. Negative for fever.  HENT: Negative for sore throat.   Eyes: Negative for visual disturbance.  Respiratory: Negative for shortness of breath.   Cardiovascular: Negative for chest pain.  Gastrointestinal: Negative for abdominal pain.  Genitourinary: Negative for dysuria.  Musculoskeletal: Positive for back pain and gait problem.  Skin: Negative for rash.  Neurological: Positive for numbness. Negative for  headaches.    Physical Exam Updated Vital Signs BP 135/84 (BP Location: Right Arm)   Pulse (!) 58   Temp 98.9 F (37.2 C) (Oral)   Resp 18   Ht 5' 1.5" (1.562 m)   Wt 86.2 kg   SpO2 100%   BMI 35.32 kg/m   Physical Exam Vitals and nursing note reviewed.  Constitutional:      General: She is not in acute distress.    Appearance: She is well-developed.  HENT:     Head: Normocephalic and atraumatic.  Eyes:     Conjunctiva/sclera: Conjunctivae normal.  Cardiovascular:     Rate and Rhythm: Normal rate and regular rhythm.  Heart sounds: No murmur.  Pulmonary:     Effort: Pulmonary effort is normal. No respiratory distress.     Breath sounds: Normal breath sounds.  Abdominal:     Palpations: Abdomen is soft.     Tenderness: There is no abdominal tenderness.  Musculoskeletal:        General: Tenderness (Vague tenderness around right hip although full range of motion without any limitations.) present. No deformity or signs of injury. Normal range of motion.     Cervical back: Neck supple.     Right lower leg: No edema.     Left lower leg: No edema.     Comments: Right hip no overlying erythema.  No warmth.  Skin:    General: Skin is warm and dry.     Capillary Refill: Capillary refill takes less than 2 seconds.  Neurological:     General: No focal deficit present.     Mental Status: She is alert.     ED Results / Procedures / Treatments   Labs (all labs ordered are listed, but only abnormal results are displayed) Labs Reviewed  URINALYSIS, ROUTINE W REFLEX MICROSCOPIC - Abnormal; Notable for the following components:      Result Value   pH 8.5 (*)    All other components within normal limits  URINE CULTURE  CULTURE, BLOOD (ROUTINE X 2)  CULTURE, BLOOD (ROUTINE X 2)  NOVEL CORONAVIRUS, NAA (HOSP ORDER, SEND-OUT TO REF LAB; TAT 18-24 HRS)  CBC WITH DIFFERENTIAL/PLATELET  BASIC METABOLIC PANEL  LACTIC ACID, PLASMA  SEDIMENTATION RATE  LACTIC ACID, PLASMA     EKG None  Radiology DG Chest 2 View  Result Date: 09/24/2019 CLINICAL DATA:  Low back and right hip pain for 2 days. Sore throat. EXAM: CHEST - 2 VIEW COMPARISON:  Radiographs 12/15/2016. CT 07/29/2015. FINDINGS: The heart size is stable at the upper limits of normal. There is aortic atherosclerosis and a probable coronary artery stent. The lungs are clear. There is no pleural effusion or pneumothorax. No acute osseous findings. IMPRESSION: Stable chest. No active cardiopulmonary process. Electronically Signed   By: Richardean Sale M.D.   On: 09/24/2019 13:23   CT Abdomen Pelvis W Contrast  Result Date: 09/24/2019 CLINICAL DATA:  69 year old female with history of right lower quadrant abdominal pain. Suspected acute appendicitis. Low back pain. EXAM: CT ABDOMEN AND PELVIS WITH CONTRAST TECHNIQUE: Multidetector CT imaging of the abdomen and pelvis was performed using the standard protocol following bolus administration of intravenous contrast. CONTRAST:  169mL OMNIPAQUE IOHEXOL 300 MG/ML  SOLN COMPARISON:  CT the abdomen and pelvis 08/29/2019. FINDINGS: Lower chest: Aortic atherosclerosis. Atherosclerotic calcification in the right coronary artery. Hepatobiliary: 1.9 cm low-attenuation lesion in the periphery of the liver between segments 4A and 8 (axial image 17 of series 2), compatible with a simple cysts, similar to the prior study. No other suspicious hepatic lesions. No intra or extrahepatic biliary ductal dilatation. Gallbladder is normal in appearance. Pancreas: No pancreatic mass. No pancreatic ductal dilatation. No pancreatic or peripancreatic fluid collections or inflammatory changes. Spleen: Unremarkable. Adrenals/Urinary Tract: Bilateral kidneys and adrenal glands are normal in appearance. No hydroureteronephrosis. Urinary bladder is normal in appearance. Stomach/Bowel: Normal appearance of the stomach. No pathologic dilatation of small bowel or colon. Numerous colonic diverticulae are  noted, particularly in the sigmoid colon, without surrounding inflammatory changes to suggest an acute diverticulitis at this time. Normal appendix. Vascular/Lymphatic: Aortic atherosclerosis, without evidence of aneurysm or dissection in the abdominal or pelvic vasculature. No  lymphadenopathy noted in the abdomen or pelvis. Reproductive: Status post hysterectomy. Ovaries are not confidently identified may be surgically absent or atrophic. Other: No significant volume of ascites.  No pneumoperitoneum. Musculoskeletal: There are no aggressive appearing lytic or blastic lesions noted in the visualized portions of the skeleton. IMPRESSION: 1. No acute findings are noted in the abdomen or pelvis to account for the patient's symptoms. Normal appendix. 2. Colonic diverticulosis without evidence of acute diverticulitis at this time. 3. Aortic atherosclerosis. Electronically Signed   By: Vinnie Langton M.D.   On: 09/24/2019 15:13   DG Hip Unilat With Pelvis 2-3 Views Right  Result Date: 09/24/2019 CLINICAL DATA:  Low back and right hip pain for 2 days. EXAM: DG HIP (WITH OR WITHOUT PELVIS) 2-3V RIGHT COMPARISON:  Abdominopelvic CT 08/29/2019. FINDINGS: The mineralization and alignment are normal. There is no evidence of acute fracture or dislocation. No evidence of femoral head avascular necrosis. No significant hip arthropathy for age. There are mild degenerative changes within the lower lumbar spine. Numerous pelvic calcifications bilaterally are grossly stable. IMPRESSION: No acute osseous findings or significant arthropathic changes in the right hip. Electronically Signed   By: Richardean Sale M.D.   On: 09/24/2019 13:25    Procedures Procedures (including critical care time)  Medications Ordered in ED Medications  morphine 4 MG/ML injection 4 mg (4 mg Intravenous Given 09/24/19 1245)  iohexol (OMNIPAQUE) 300 MG/ML solution 100 mL (100 mLs Intravenous Contrast Given 09/24/19 1444)    ED Course  I have  reviewed the triage vital signs and the nursing notes.  Pertinent labs & imaging results that were available during my care of the patient were reviewed by me and considered in my medical decision making (see chart for details).  Clinical Course as of Sep 23 1716  Wed Sep 23, 1957  5340 69 year old female here with back pain right hip pain and chills over the past few days.  Able to range of motion the hip so doubt septic hip.  No reported falls.   [MB]  [id number] Chest x-ray and right hip x-ray interpreted by me as no acute findings.  No white count.  Urinalysis negative.   [MB]  [id number] Patient's lactate came back unremarkable and her CT does not show any acute findings.  Her symptoms possibly related to sciatica or radiculopathy.  Reviewed this with her and told her we put her on some pain medicine and a steroid taper.  She is comfortable with plan will follow up with her primary care doctor.   [MB]    Clinical Course User Index [MB] Hayden Rasmussen, MD   MDM Rules/Calculators/A&P                       Final Clinical Impression(s) / ED Diagnoses Final diagnoses:  Sciatica of right side    Rx / DC Orders ED Discharge Orders         Ordered    oxyCODONE-acetaminophen (PERCOCET/ROXICET) 5-325 MG tablet  Every 6 hours PRN     09/24/19 1522    predniSONE (STERAPRED UNI-PAK 21 TAB) 10 MG (21) TBPK tablet  Daily     09/24/19 1522           Hayden Rasmussen, MD 09/24/19 1719

## 2019-09-24 NOTE — Discharge Instructions (Addendum)
You were seen in the emergency department for low back pain radiating into your right hip and thigh.  You had a CAT scan of your abdomen and pelvis along with blood work urinalysis chest x-ray that did not show any serious findings.  We are treating you with some pain medication and a steroid taper.  Please contact your primary care doctor for close follow-up.  Return to the emergency department if any worsening or concerning symptoms.

## 2019-09-24 NOTE — ED Triage Notes (Signed)
Pt is here for lower back pain and right hip and right thigh pain.  Pt is reporting that the pain is 8/10 and it is throbbing and making it difficult to walk.  Pt has taken a muscle ralaxant at 5:30 am but continues to have pain.  Not associated with any trauma.

## 2019-09-24 NOTE — ED Notes (Signed)
Pt was helped to the bathroom to void. Pt reports continued pain, MD notified

## 2019-09-25 LAB — URINE CULTURE: Culture: <10000 — AB

## 2019-09-25 LAB — NOVEL CORONAVIRUS, NAA (HOSP ORDER, SEND-OUT TO REF LAB; TAT 18-24 HRS): SARS-CoV-2, NAA: NOT DETECTED

## 2019-09-26 ENCOUNTER — Other Ambulatory Visit: Payer: Self-pay | Admitting: Family Medicine

## 2019-09-26 ENCOUNTER — Encounter: Payer: Self-pay | Admitting: Family Medicine

## 2019-09-26 MED ORDER — OXYCODONE-ACETAMINOPHEN 5-325 MG PO TABS: 1.0000 | ORAL_TABLET | Freq: Four times a day (QID) | ORAL | 0 refills | Status: DC | PRN

## 2019-09-26 NOTE — Telephone Encounter (Incomplete)
Medication Refill - Medication: hydrocodone   Has the patient contacted their pharmacy? Yes.   PT states she got medication filled in the hospital and that she is running out but that she is still in a lot of pain. Please advise.  (Agent: If no, request that the patient contact the pharmacy for the refill.) (Agent: If yes, when and what did the pharmacy advise?)  Preferred Pharmacy (with phone number or street name):  [id number] - [place], [place] - [address] AT  [place] [address]  Phone: [phone] Fax: [phone]  Not a 24 hour pharmacy; exact hours not known.     Agent: Please be advised that RX refills may take up to 3 business days. We ask that you follow-up with your pharmacy.

## 2019-09-26 NOTE — Telephone Encounter (Signed)
Pt requesting refill of Oxycodone which was prescribed in ED on 1/13. Please review for refill.

## 2019-09-29 LAB — CULTURE, BLOOD (ROUTINE X 2)
Culture: NO GROWTH
Culture: NO GROWTH
Special Requests: ADEQUATE
Special Requests: ADEQUATE

## 2019-09-29 NOTE — Progress Notes (Addendum)
[place] [facility] [address], [place], [place] [address] [phone]  Date:  10/01/2019   Name:  Lisa Acosta   DOB:  Sep 25, 1950   MRN:  [id number]  PCP:  Darreld Mclean, MD    Chief Complaint: ER follow up   History of Present Illness:  Lisa Acosta is a 69 y.o. very pleasant female patient who presents with the following:  Following up today from recent ER visit History of depression, atrial fibrillation, osteoarthritis and osteopenia, hypothyroidism, hyperlipidemia  She was seen in the ER on January 13 with concern of severe lower back pain with radiation to the right leg This pain started about 2 weeks ago now- fairly sudden onset but no injury She has pain into her RIGHT leg and numbness in the calf The back is sore but not quite as bad as her leg No loss of bowel or bladder control- she does have to urinate frequently but this is not new Negative urine culture at ER visit  She was diagnosed with sciatica, treated with oxycodone and a prednisone taper Imaging studies performed as below-no plain films of lumbar spine as of yet  Here today with her daughter Altha Harm who contributes to the history. She is concerned about her mom's mental state and her frequent falls, general physical weakness and difficulty walking  She notes that her mother really does not move around much, she may go between her bed and chair at home.  She rarely leaves the house. She has been to see Dr Jannifer Franklin for her gait issue - last visit in December She was doing PT until about a year ago; it did seem to help her  She is not driving currently  She stopped her trazodone while she was on the oxycodone; she thinks that stopping trazodone and taking prednisone may have contributed to increased anxiety She woke up early this morning with an apparent panic attack She took her trazodone earlier this morning and calm down-she is still  anxious, but not acutely panicked  She is not using oxycodone at this time, just plain Tylenol 09/26/2019  1   09/26/2019  Oxycodone-Acetaminophen 5-325  18.00  3 Je Cop   [id number]   Nor ([id number])   0  45.00 MME  Private Pay   [place]  09/24/2019  1   09/24/2019  Oxycodone-Acetaminophen 5-325  15.00  2 Mi But   [id number]   Nor ([id number])   0  56.25 MME  Private Pay   [place]  05/02/2019  1   05/02/2019  Hydrocodone-Acetamin 5-325 MG  30.00  7 Yv Low   [id number]   Nor ([id number])   0  21.43 MME  Medicare   [place]  06/04/2018  1   06/04/2018  Hydrocodone-Acetamin 5-325 MG  40.00  7 Ro Col   [id number]   Nor ([id number])   0  28.57 MME  Medicare   [place]  01/22/2018  1   01/22/2018  Hydrocodone-Acetamin 5-325 MG  6.00  1 Li [place]   [id number]   Med ([id number])   0  30.00 MME        DG Chest 2 View  Result Date: 09/24/2019 CLINICAL DATA:  Low back and right hip pain for 2 days. Sore throat. EXAM: CHEST - 2 VIEW COMPARISON:  Radiographs 12/15/2016. CT 07/29/2015. FINDINGS: The heart size is stable at the upper limits of normal. There is aortic atherosclerosis and a probable coronary  artery stent. The lungs are clear. There is no pleural effusion or pneumothorax. No acute osseous findings. IMPRESSION: Stable chest. No active cardiopulmonary process. Electronically Signed   By: Richardean Sale M.D.   On: 09/24/2019 13:23   CT Abdomen Pelvis W Contrast  Result Date: 09/24/2019 CLINICAL DATA:  69 year old female with history of right lower quadrant abdominal pain. Suspected acute appendicitis. Low back pain. EXAM: CT ABDOMEN AND PELVIS WITH CONTRAST TECHNIQUE: Multidetector CT imaging of the abdomen and pelvis was performed using the standard protocol following bolus administration of intravenous contrast. CONTRAST:  181mL OMNIPAQUE IOHEXOL 300 MG/ML  SOLN COMPARISON:  CT the abdomen and pelvis 08/29/2019. FINDINGS: Lower chest: Aortic atherosclerosis. Atherosclerotic calcification in the right coronary artery. Hepatobiliary: 1.9 cm low-attenuation lesion in  the periphery of the liver between segments 4A and 8 (axial image 17 of series 2), compatible with a simple cysts, similar to the prior study. No other suspicious hepatic lesions. No intra or extrahepatic biliary ductal dilatation. Gallbladder is normal in appearance. Pancreas: No pancreatic mass. No pancreatic ductal dilatation. No pancreatic or peripancreatic fluid collections or inflammatory changes. Spleen: Unremarkable. Adrenals/Urinary Tract: Bilateral kidneys and adrenal glands are normal in appearance. No hydroureteronephrosis. Urinary bladder is normal in appearance. Stomach/Bowel: Normal appearance of the stomach. No pathologic dilatation of small bowel or colon. Numerous colonic diverticulae are noted, particularly in the sigmoid colon, without surrounding inflammatory changes to suggest an acute diverticulitis at this time. Normal appendix. Vascular/Lymphatic: Aortic atherosclerosis, without evidence of aneurysm or dissection in the abdominal or pelvic vasculature. No lymphadenopathy noted in the abdomen or pelvis. Reproductive: Status post hysterectomy. Ovaries are not confidently identified may be surgically absent or atrophic. Other: No significant volume of ascites.  No pneumoperitoneum. Musculoskeletal: There are no aggressive appearing lytic or blastic lesions noted in the visualized portions of the skeleton. IMPRESSION: 1. No acute findings are noted in the abdomen or pelvis to account for the patient's symptoms. Normal appendix. 2. Colonic diverticulosis without evidence of acute diverticulitis at this time. 3. Aortic atherosclerosis. Electronically Signed   By: Vinnie Langton M.D.   On: 09/24/2019 15:13   DG Hip Unilat With Pelvis 2-3 Views Right  Result Date: 09/24/2019 CLINICAL DATA:  Low back and right hip pain for 2 days. EXAM: DG HIP (WITH OR WITHOUT PELVIS) 2-3V RIGHT COMPARISON:  Abdominopelvic CT 08/29/2019. FINDINGS: The mineralization and alignment are normal. There is no  evidence of acute fracture or dislocation. No evidence of femoral head avascular necrosis. No significant hip arthropathy for age. There are mild degenerative changes within the lower lumbar spine. Numerous pelvic calcifications bilaterally are grossly stable. IMPRESSION: No acute osseous findings or significant arthropathic changes in the right hip. Electronically Signed   By: Richardean Sale M.D.   On: 09/24/2019 13:25    Patient Active Problem List   Diagnosis Date Noted  . Memory difficulty 08/18/2019  . Right wrist pain 05/04/2019  . Mild episode of recurrent major depressive disorder ([place]) 12/11/2018  . Depression, major, single episode, mild ([place]) 10/22/2018  . Gait abnormality 10/14/2018  . 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 Moderate episode of recurrent major depressive disorder ([place]) 09/26/2018  . Generalized anxiety disorder 09/26/2018  . Osteopenia 01/25/2018  . 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 Dyspnea 09/22/2016  . OSA (obstructive sleep apnea) 08/31/2016  . PAF (paroxysmal atrial fibrillation) ([employer]) 03/16/2016  . Hypothyroid 03/16/2016  . Obesity (BMI 30-39.9) 03/16/2016  . Essential hypertension 11/10/2015  . Hyperlipidemia 11/10/2015  Past Medical History:  Diagnosis Date  . Atrial fibrillation ([place])   . Depression   . Diverticulitis   . Diverticulosis   . Esophageal ulcer   . Gait abnormality 10/14/2018  . GERD (gastroesophageal reflux disease)   . Hyperlipidemia   . Hypertension   . Hypothyroid   . Memory difficulty 08/18/2019  . OSA (obstructive sleep apnea) 08/31/2016  . Osteopenia   . 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 BREAST BIOPSY Left    needle core biopsy, benign  . CATARACT EXTRACTION    . HAND SURGERY    . KNEE SURGERY    . ROTATOR CUFF REPAIR    . TOTAL ABDOMINAL HYSTERECTOMY      Social History   Tobacco Use  . Smoking status: Never Smoker  .  Smokeless tobacco: Never Used  Substance Use Topics  . Alcohol use: Yes    Comment: rare  . Drug use: No    Family History  Problem Relation Age of Onset  . Atrial fibrillation Mother   . Congestive Heart Failure Mother   . Depression Mother   . Heart disease Father   . Alcohol abuse Father   . Heart disease Brother   . Heart disease Brother   . Depression Sister   . Depression Daughter     Allergies  Allergen Reactions  . Dextran Anaphylaxis  . Dextrans   . Dextromethorphan Hbr Other (See Comments)    hallucinations  . Tree Extract   . Penicillin G Rash  . Penicillins Rash  . Sulfa Antibiotics Rash  . Sulfacetamide Sodium Rash    Medication list has been reviewed and updated.  Current Outpatient Medications on File Prior to Visit  Medication Sig Dispense Refill  . amLODipine (NORVASC) 5 MG tablet Take 1 tablet (5 mg total) by mouth daily. 90 tablet 3  . atenolol (TENORMIN) 50 MG tablet Take 1 tablet (50 mg total) by mouth daily. 7 tablet 0  . BIOTIN PO Take 1 tablet by mouth daily.    . Calcium Carb-Cholecalciferol (CALCIUM 600 + D PO) Take by mouth.    . flecainide (TAMBOCOR) 100 MG tablet TAKE 1 TABLET TWICE A DAY 180 tablet 3  . FLUoxetine (PROZAC) 40 MG capsule Take 1 capsule (40 mg total) by mouth 2 (two) times daily. 180 capsule 3  . Krill Oil 1000 MG CAPS Take 1 capsule by mouth daily.    [name] lactobacillus acidophilus (BACID) TABS tablet Take 1 tablet by mouth daily.    [name] levothyroxine (SYNTHROID) 125 MCG tablet Take 1 tablet (125 mcg total) by mouth daily before breakfast. 7 tablet 0  . lovastatin (MEVACOR) 40 MG tablet TAKE 1 TABLET BY MOUTH EVERYDAY AT BEDTIME 90 tablet 0  . LUTEIN PO Take 1 capsule by mouth daily.    . magnesium 30 MG tablet Take 30 mg by mouth daily.    . methocarbamol (ROBAXIN) 500 MG tablet Take 1 tablet (500 mg total) by mouth every 8 (eight) hours as needed for muscle spasms. 40 tablet 0  . omeprazole (PRILOSEC) 40 MG capsule Take 1  capsule (40 mg total) by mouth 2 (two) times daily. 180 capsule 3  . oxyCODONE-acetaminophen (PERCOCET/ROXICET) 5-325 MG tablet Take 1-2 tablets by mouth every 6 (six) hours as needed for severe pain. 18 tablet 0  . Potassium 75 MG TABS Take 1 tablet by mouth daily.    . predniSONE (STERAPRED UNI-PAK 21 TAB) 10  MG (21) TBPK tablet Take by mouth daily. Take 6 tabs by mouth daily  for 2 days, then 5 tabs for 2 days, then 4 tabs for 2 days, then 3 tabs for 2 days, 2 tabs for 2 days, then 1 tab by mouth daily for 2 days 42 tablet 0  . sucralfate (CARAFATE) 1 g tablet Take 1 g by mouth 2 (two) times daily.    . traZODone (DESYREL) 50 MG tablet Take 1.5 tablets (75 mg total) by mouth at bedtime. 135 tablet 0  . Turmeric 450 MG CAPS Take 1 capsule by mouth daily.     No current facility-administered medications on file prior to visit.    Review of Systems:  As per HPI- otherwise negative. No fever or chills  Physical Examination: Vitals:   10/01/19 1123  BP: 128/76  Pulse: 60  Resp: 19  Temp: (!) 97.1 F (36.2 C)  SpO2: 98%   Vitals:   10/01/19 1123  Weight: 195 lb (88.5 kg)  Height: 5' 1.5" (1.562 m)   Body mass index is 36.25 kg/m. Ideal Body Weight: Weight in (lb) to have BMI = 25: 134.2  GEN: WDWN, NAD, Non-toxic, A & O x 3, obese, appears somewhat anxious HEENT: Atraumatic, Normocephalic. Neck supple. No masses, No LAD. Ears and Nose: No external deformity. CV: RRR-seems in sinus rhythm at this time, No M/G/R. No JVD. No thrill. No extra heart sounds. PULM: CTA B, no wheezes, crackles, rhonchi. No retractions. No resp. distress. No accessory muscle use. ABD: S, NT, ND, +BS. No rebound. No HSM. EXTR: No c/c/e NEURO moving very slowly, gets to her feet with difficulty No lumbar tenderness to palpation.  She is tender over the right sciatic notch.  Thoracolumbar flexion and extension is limited by pain.  She is able to rise up onto her toes.  Left lower extremity displays normal  strength and deep tendon reflex.  The deep tendon reflex at the patella and flexion and extension strength of the right leg is decreased.  No saddle anesthesia or numbness to touch on either side PSYCH: Normally interactive. Conversant. Not depressed or anxious appearing.  Calm demeanor.    Assessment and Plan: Hospital discharge follow-up  Paroxysmal atrial fibrillation ([place]) - Plan: rivaroxaban (XARELTO) 20 MG TABS tablet  Lumbar pain with radiation down right leg - Plan: DG Lumbar Spine Complete, Ambulatory referral to Physical Therapy  Hypothyroidism due to acquired atrophy of thyroid - Plan: TSH  Depression, major, single episode, mild (HCC)  Anxiety  Here today to follow-up from recent ER visit with lumbar pain She had a CT of her abdomen, plain films of her hip She was started on oxycodone and prednisone taper The prednisone seems to be causing anxiety and insomnia.  Advised her to stop taking now and restart trazodone She is no longer using oxycodone, we went over safe limits for daily acetaminophen Will obtain plain films of her lumbar spine today, plan to proceed to MRI Referred her to physical therapy to work on strength, strengthening, balance and gait stability Discussed her depression and insomnia.  Unfortunately Sirenia has not been able to find a psychiatrist who took her insurance and was helpful to her.  Though her daughter is concerned, Lucretia feels that her mood is okay generally, she got especially anxious with the prednisone I did encourage her to find a therapist with whom she can discuss some of her worries and concerns She is getting her first COVID-19 vaccine in 2 days  Medical decision making level today is high, I spent 45 minutes in face-to-face care with patient This visit occurred during the SARS-CoV-2 public health emergency.  Safety protocols were in place, including screening questions prior to the visit, additional usage of staff PPE, and extensive  cleaning of exam room while observing appropriate contact time as indicated for disinfecting solutions.    Signed Lamar Blinks, MD   Received her spine films 1/21-message to patient.  We will set up for MRI CLINICAL DATA:  Right-sided low back pain radiating into the right knee for the past week.  EXAM: LUMBAR SPINE - COMPLETE 4+ VIEW  COMPARISON:  CT abdomen pelvis dated September 24, 2019.  FINDINGS: Five lumbar type vertebral bodies. No acute fracture or subluxation. Vertebral body heights are preserved. Alignment is normal. Unchanged mild disc height loss and facet arthropathy at L4-L5 and L5-S1.  IMPRESSION: Unchanged mild lower lumbar spondylosis.

## 2019-09-29 NOTE — Patient Instructions (Addendum)
It was good to see you again today I would suggest that you get the shingles vaccine at your convenience through your pharmacy- as well as your covid vaccine of course!    We will get plain films of your lumbar spine today- assuming these look reasonable we will plan to proceed to an MRI I am also going to refer you to physical therapy to work on your strength and gait- I referred you to [facility] assuming they take your insurance  We will check your thyroid level today  I do agree that seeing a therapist would be a good idea- most are doing phone/ virtual visits now so you don't have to worry about going out   Stop prednisone Restart trazodone  Tylenol- can take up to 4gm daily - watch as there can also be acetaminophen in cold meds, etc

## 2019-10-01 ENCOUNTER — Ambulatory Visit (HOSPITAL_BASED_OUTPATIENT_CLINIC_OR_DEPARTMENT_OTHER)
Admission: RE | Admit: 2019-10-01 | Discharge: 2019-10-01 | Disposition: A | Discharge status: Home / Self Care | Payer: PPO | Source: Ambulatory Visit | Attending: Family Medicine | Admitting: Family Medicine

## 2019-10-01 ENCOUNTER — Ambulatory Visit (INDEPENDENT_AMBULATORY_CARE_PROVIDER_SITE_OTHER): Payer: PPO | Admitting: Family Medicine

## 2019-10-01 ENCOUNTER — Other Ambulatory Visit: Payer: Self-pay

## 2019-10-01 ENCOUNTER — Encounter: Payer: Self-pay | Admitting: Family Medicine

## 2019-10-01 VITALS — BP 128/76 | HR 60 | Temp 97.1°F | Resp 19 | Ht 61.5 in | Wt 195.0 lb

## 2019-10-01 DIAGNOSIS — F419 Anxiety disorder, unspecified: Secondary | ICD-10-CM

## 2019-10-01 DIAGNOSIS — E034 Atrophy of thyroid (acquired): Secondary | ICD-10-CM

## 2019-10-01 DIAGNOSIS — M79604 Pain in right leg: Secondary | ICD-10-CM

## 2019-10-01 DIAGNOSIS — M545 Low back pain, unspecified: Secondary | ICD-10-CM

## 2019-10-01 DIAGNOSIS — I480 Paroxysmal atrial fibrillation: Secondary | ICD-10-CM

## 2019-10-01 DIAGNOSIS — Z09 Encounter for follow-up examination after completed treatment for conditions other than malignant neoplasm: Secondary | ICD-10-CM

## 2019-10-01 DIAGNOSIS — F320 Major depressive disorder, single episode, mild: Secondary | ICD-10-CM

## 2019-10-01 LAB — TSH: TSH: 1.45 u[IU]/mL (ref 0.35–4.50)

## 2019-10-01 MED ORDER — RIVAROXABAN 20 MG PO TABS: ORAL_TABLET | ORAL | 0 refills | Status: DC

## 2019-10-02 ENCOUNTER — Encounter: Payer: Self-pay | Admitting: Family Medicine

## 2019-10-02 ENCOUNTER — Telehealth: Payer: Self-pay | Admitting: Family Medicine

## 2019-10-02 ENCOUNTER — Telehealth: Payer: Self-pay

## 2019-10-02 MED ORDER — HYDROCODONE-ACETAMINOPHEN 5-325 MG PO TABS: 1.0000 | ORAL_TABLET | Freq: Three times a day (TID) | ORAL | 0 refills | Status: DC | PRN

## 2019-10-02 NOTE — Addendum Note (Signed)
Addended by: Lamar Blinks C on: 10/02/2019 06:52 AM   Modules accepted: Orders

## 2019-10-02 NOTE — Telephone Encounter (Signed)
Patient calling in regards to medication concern, states that she was in the ED last week, needs pain meds for her back. Had an appointment with Dr Laurier Nancy in regards to ED visit. Patient didn't mention to Dr that she thinks what the ED gave was to strong. She  would like something different other than oxycodone for pain.

## 2019-10-02 NOTE — Telephone Encounter (Signed)
I called her back- she does not want to use oxycodone as too strong. Would prefer to use hydrocodone  I will call in for her  09/26/2019  1   09/26/2019  Oxycodone-Acetaminophen 5-325  18.00  3 Je Cop   [id number]   Nor ([id number])   0  45.00 MME  Private Pay   [place]  09/24/2019  1   09/24/2019  Oxycodone-Acetaminophen 5-325  15.00  2 Mi But   [id number]   Nor ([id number])   0  56.25 MME  Private Pay   [place]  05/02/2019  1   05/02/2019  Hydrocodone-Acetamin 5-325 MG  30.00  7 Yv Low   [id number]   Nor ([id number])   0  21.43 MME  Medicare   [place]  06/04/2018  1   06/04/2018  Hydrocodone-Acetamin 5-325 MG  40.00  7 Ro Col   [id number]   Nor ([id number])   0  28.57 MME  Medicare

## 2019-10-02 NOTE — Telephone Encounter (Signed)
Pt called requesting Hydrocodone for her pain.  Pharmacy is [facility] [address] in [place].

## 2019-10-05 ENCOUNTER — Encounter: Payer: Self-pay | Admitting: Family Medicine

## 2019-10-06 ENCOUNTER — Other Ambulatory Visit: Payer: Self-pay

## 2019-10-06 MED ORDER — LOVASTATIN 40 MG PO TABS: ORAL_TABLET | ORAL | 0 refills | Status: DC

## 2019-10-07 DIAGNOSIS — R262 Difficulty in walking, not elsewhere classified: Secondary | ICD-10-CM | POA: Diagnosis not present

## 2019-10-07 DIAGNOSIS — M79604 Pain in right leg: Secondary | ICD-10-CM | POA: Diagnosis not present

## 2019-10-07 DIAGNOSIS — M545 Low back pain: Secondary | ICD-10-CM | POA: Diagnosis not present

## 2019-10-07 DIAGNOSIS — R531 Weakness: Secondary | ICD-10-CM | POA: Diagnosis not present

## 2019-10-08 ENCOUNTER — Telehealth: Payer: Self-pay | Admitting: Family Medicine

## 2019-10-08 MED ORDER — HYDROCODONE-ACETAMINOPHEN 5-325 MG PO TABS: 1.0000 | ORAL_TABLET | Freq: Three times a day (TID) | ORAL | 0 refills | Status: DC | PRN

## 2019-10-08 NOTE — Telephone Encounter (Signed)
Refilled

## 2019-10-08 NOTE — Telephone Encounter (Signed)
Patient states that her lower back and leg pain has worsen and would like Dr Lorelei Pont  to RefillHYDROcodone-Acetaminophen 5-325 MG 1 tablet Oral Every 8 hours PRN . Pt states she has a MRI tomorrow and would meds before than to help with pain.     [facility] [address] is her preferred.

## 2019-10-09 ENCOUNTER — Encounter: Payer: Self-pay | Admitting: Family Medicine

## 2019-10-09 ENCOUNTER — Other Ambulatory Visit: Payer: Self-pay

## 2019-10-09 ENCOUNTER — Other Ambulatory Visit: Payer: Self-pay | Admitting: Family Medicine

## 2019-10-09 ENCOUNTER — Ambulatory Visit
Admission: RE | Admit: 2019-10-09 | Discharge: 2019-10-09 | Disposition: A | Discharge status: Home / Self Care | Payer: PPO | Source: Ambulatory Visit | Attending: Family Medicine | Admitting: Family Medicine

## 2019-10-09 DIAGNOSIS — M545 Low back pain, unspecified: Secondary | ICD-10-CM

## 2019-10-09 DIAGNOSIS — M79604 Pain in right leg: Secondary | ICD-10-CM

## 2019-10-09 DIAGNOSIS — M5126 Other intervertebral disc displacement, lumbar region: Secondary | ICD-10-CM | POA: Diagnosis not present

## 2019-10-09 DIAGNOSIS — M519 Unspecified thoracic, thoracolumbar and lumbosacral intervertebral disc disorder: Secondary | ICD-10-CM

## 2019-10-09 NOTE — Progress Notes (Signed)
Received her MRI report as below  CLINICAL DATA:  Severe back pain with numbness and weakness right leg.  EXAM: MRI LUMBAR SPINE WITHOUT CONTRAST  TECHNIQUE: Multiplanar, multisequence MR imaging of the lumbar spine was performed. No intravenous contrast was administered.  COMPARISON:  Lumbar radiographs 10/01/2019  FINDINGS: Segmentation:  Normal  Alignment:  Normal  Vertebrae:  Normal bone marrow.  Negative for fracture or mass.  Conus medullaris and cauda equina: Conus extends to the L2 level. Conus and cauda equina appear normal. Fatty infiltration of the filum terminalis without significant enlargement. Negative for tethered cord.  Paraspinal and other soft tissues: Negative for paraspinous mass or adenopathy.  Disc levels:  L1-2: Negative  L2-3: Negative  L3-4: Mild disc bulging and mild facet degeneration. Negative for disc protrusion or stenosis  L4-5: Extruded disc fragment on the right with upgoing disc material. Impingement right L4 nerve root under the pedicle. Mild facet degeneration. Spinal canal adequate in size  L5-S1: Mild disc degeneration. Mild left foraminal narrowing due to spurring.  IMPRESSION: Extruded disc fragment on the right at L4-5 with impingement right L4 nerve root. Mild degenerative change L3-4 and L5-S1.   Message to pt, urgent referral to [facility] as pt miserable

## 2019-10-10 ENCOUNTER — Ambulatory Visit: Payer: PPO | Attending: Internal Medicine

## 2019-10-10 ENCOUNTER — Other Ambulatory Visit: Payer: PPO

## 2019-10-10 ENCOUNTER — Encounter: Payer: Self-pay | Admitting: Family Medicine

## 2019-10-10 ENCOUNTER — Telehealth: Payer: Self-pay | Admitting: Family Medicine

## 2019-10-10 DIAGNOSIS — Z6837 Body mass index (BMI) 37.0-37.9, adult: Secondary | ICD-10-CM | POA: Diagnosis not present

## 2019-10-10 DIAGNOSIS — Z20822 Contact with and (suspected) exposure to covid-19: Secondary | ICD-10-CM | POA: Diagnosis not present

## 2019-10-10 DIAGNOSIS — G4700 Insomnia, unspecified: Secondary | ICD-10-CM

## 2019-10-10 DIAGNOSIS — M5416 Radiculopathy, lumbar region: Secondary | ICD-10-CM | POA: Diagnosis not present

## 2019-10-10 DIAGNOSIS — I480 Paroxysmal atrial fibrillation: Secondary | ICD-10-CM

## 2019-10-10 DIAGNOSIS — R002 Palpitations: Secondary | ICD-10-CM

## 2019-10-10 DIAGNOSIS — E034 Atrophy of thyroid (acquired): Secondary | ICD-10-CM

## 2019-10-10 NOTE — Telephone Encounter (Signed)
Medication: atenolol (TENORMIN) 50 MG tablet  amLODipine (NORVASC) 5 MG tabl FLUoxetine (PROZAC) 40 MG capsule traZODone (DESYREL) 50 MG tablet  lovastatin (MEVACOR) 40 MG tablet levothyroxine (SYNTHROID) 125 MCG tablet  flecainide (TAMBOCOR) 100 MG tablet omeprazole (PRILOSEC) 40 MG capsule      Has the patient contacted their pharmacy? Yes.   (If no, request that the patient contact the pharmacy for the refill.) (If yes, when and what did the pharmacy advise?)  Preferred Pharmacy (with phone number or street name): [facility] [phone] ( [place])     Agent: Please be advised that RX refills may take up to 3 business days. We ask that you follow-up with your pharmacy.   PATIENT STATES WE SHOULD ADD TO 2 DAY DELIVERY

## 2019-10-11 LAB — NOVEL CORONAVIRUS, NAA: SARS-CoV-2, NAA: NOT DETECTED

## 2019-10-13 MED ORDER — AMLODIPINE BESYLATE 5 MG PO TABS: 5.0000 mg | ORAL_TABLET | Freq: Every day | ORAL | 3 refills | Status: DC

## 2019-10-13 MED ORDER — LOVASTATIN 40 MG PO TABS: ORAL_TABLET | ORAL | 0 refills | Status: DC

## 2019-10-13 MED ORDER — LEVOTHYROXINE SODIUM 125 MCG PO TABS: 125.0000 ug | ORAL_TABLET | Freq: Every day | ORAL | 0 refills | Status: DC

## 2019-10-13 MED ORDER — TRAZODONE HCL 50 MG PO TABS: 75.0000 mg | ORAL_TABLET | Freq: Every day | ORAL | 0 refills | Status: DC

## 2019-10-13 MED ORDER — ATENOLOL 50 MG PO TABS: 50.0000 mg | ORAL_TABLET | Freq: Every day | ORAL | 0 refills | Status: DC

## 2019-10-13 MED ORDER — FLECAINIDE ACETATE 100 MG PO TABS: 100.0000 mg | ORAL_TABLET | Freq: Two times a day (BID) | ORAL | 3 refills | Status: DC

## 2019-10-13 MED ORDER — OMEPRAZOLE 40 MG PO CPDR: 40.0000 mg | DELAYED_RELEASE_CAPSULE | Freq: Two times a day (BID) | ORAL | 3 refills | Status: DC

## 2019-10-13 NOTE — Telephone Encounter (Signed)
Medication refilled

## 2019-10-14 ENCOUNTER — Telehealth: Payer: Self-pay

## 2019-10-14 DIAGNOSIS — I480 Paroxysmal atrial fibrillation: Secondary | ICD-10-CM

## 2019-10-14 DIAGNOSIS — E034 Atrophy of thyroid (acquired): Secondary | ICD-10-CM

## 2019-10-14 DIAGNOSIS — R002 Palpitations: Secondary | ICD-10-CM

## 2019-10-14 MED ORDER — LEVOTHYROXINE SODIUM 125 MCG PO TABS: 125.0000 ug | ORAL_TABLET | Freq: Every day | ORAL | 3 refills | Status: DC

## 2019-10-14 MED ORDER — AMLODIPINE BESYLATE 5 MG PO TABS: 5.0000 mg | ORAL_TABLET | Freq: Every day | ORAL | 3 refills | Status: DC

## 2019-10-14 MED ORDER — HYDROCODONE-ACETAMINOPHEN 5-325 MG PO TABS: 1.0000 | ORAL_TABLET | Freq: Three times a day (TID) | ORAL | 0 refills | Status: DC | PRN

## 2019-10-14 NOTE — Telephone Encounter (Signed)
[facility] from [facility] is needing Dr. Lorelei Pont to send in a 37  DAY prescription for  (ATENOLOL) &  (lEVOTHYROXINE) Please follow up with  [facility] at [phone]   Thanks

## 2019-10-14 NOTE — Telephone Encounter (Signed)
90 days sent in

## 2019-10-14 NOTE — Progress Notes (Deleted)
HPI: FU atrial fibrillation. Monitor March 2017 showed sinus rhythm with paroxysmal atrial fibrillation. Exercise treadmill October 2018 showed no ST changes and interpreted as negative and adequate as patient achieved 63% predicted heart rate).  Echocardiogram 8/19 showed normal LV function, mild RV dysfunction, mild mitral and tricuspid regurgitation.  Since last seen,   Current Outpatient Medications  Medication Sig Dispense Refill  . amLODipine (NORVASC) 5 MG tablet Take 1 tablet (5 mg total) by mouth daily. 90 tablet 3  . atenolol (TENORMIN) 50 MG tablet Take 1 tablet (50 mg total) by mouth daily. 7 tablet 0  . BIOTIN PO Take 1 tablet by mouth daily.    . Calcium Carb-Cholecalciferol (CALCIUM 600 + D PO) Take by mouth.    . flecainide (TAMBOCOR) 100 MG tablet Take 1 tablet (100 mg total) by mouth 2 (two) times daily. 180 tablet 3  . FLUoxetine (PROZAC) 40 MG capsule Take 1 capsule (40 mg total) by mouth 2 (two) times daily. 180 capsule 3  . Krill Oil 1000 MG CAPS Take 1 capsule by mouth daily.    [name] lactobacillus acidophilus (BACID) TABS tablet Take 1 tablet by mouth daily.    [name] levothyroxine (SYNTHROID) 125 MCG tablet Take 1 tablet (125 mcg total) by mouth daily before breakfast. 7 tablet 0  . lovastatin (MEVACOR) 40 MG tablet TAKE 1 TABLET BY MOUTH EVERYDAY AT BEDTIME 90 tablet 0  . LUTEIN PO Take 1 capsule by mouth daily.    . magnesium 30 MG tablet Take 30 mg by mouth daily.    . methocarbamol (ROBAXIN) 500 MG tablet Take 1 tablet (500 mg total) by mouth every 8 (eight) hours as needed for muscle spasms. 40 tablet 0  . omeprazole (PRILOSEC) 40 MG capsule Take 1 capsule (40 mg total) by mouth 2 (two) times daily. 180 capsule 3  . Potassium 75 MG TABS Take 1 tablet by mouth daily.    . predniSONE (STERAPRED UNI-PAK 21 TAB) 10 MG (21) TBPK tablet Take by mouth daily. Take 6 tabs by mouth daily  for 2 days, then 5 tabs for 2 days, then 4 tabs for 2 days, then 3 tabs for 2 days, 2  tabs for 2 days, then 1 tab by mouth daily for 2 days 42 tablet 0  . rivaroxaban (XARELTO) 20 MG TABS tablet TAKE 1 TABLET DAILY WITH   SUPPER 90 tablet 0  . sucralfate (CARAFATE) 1 g tablet Take 1 g by mouth 2 (two) times daily.    . traZODone (DESYREL) 50 MG tablet Take 1.5 tablets (75 mg total) by mouth at bedtime. 135 tablet 0  . Turmeric 450 MG CAPS Take 1 capsule by mouth daily.     No current facility-administered medications for this visit.     Past Medical History:  Diagnosis Date  . Atrial fibrillation ([place])   . Depression   . Diverticulitis   . Diverticulosis   . Esophageal ulcer   . Gait abnormality 10/14/2018  . GERD (gastroesophageal reflux disease)   . Hyperlipidemia   . Hypertension   . Hypothyroid   . Memory difficulty 08/18/2019  . OSA (obstructive sleep apnea) 08/31/2016  . Osteopenia   . 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 BREAST BIOPSY Left    needle core biopsy, benign  . CATARACT EXTRACTION    . HAND SURGERY    . KNEE SURGERY    . ROTATOR CUFF REPAIR    .  TOTAL ABDOMINAL HYSTERECTOMY      Social History   Socioeconomic History  . Marital status: Married    Spouse name: Jenny Reichmann  . Number of children: 1  . Years of education: College  . Highest education level: Not on file  Occupational History  . Occupation: Conservator, museum/gallery: OTHER    Comment: [employer]  Tobacco Use  . Smoking status: Never Smoker  . Smokeless tobacco: Never Used  Substance and Sexual Activity  . Alcohol use: Yes    Comment: rare  . Drug use: No  . Sexual activity: Not on file  Other Topics Concern  . Not on file  Social History Narrative   Patient lives at home spouse.   Caffeine use: 2 sodas weekly   Right handed    Social Determinants of Health   Financial Resource Strain:   . Difficulty of Paying Living Expenses: Not on file  Food Insecurity:   . Worried About Charity fundraiser  in the Last Year: Not on file  . Ran Out of Food in the Last Year: Not on file  Transportation Needs:   . Lack of Transportation (Medical): Not on file  . Lack of Transportation (Non-Medical): Not on file  Physical Activity:   . Days of Exercise per Week: Not on file  . Minutes of Exercise per Session: Not on file  Stress:   . Feeling of Stress : Not on file  Social Connections:   . Frequency of Communication with Friends and Family: Not on file  . Frequency of Social Gatherings with Friends and Family: Not on file  . Attends Religious Services: Not on file  . Active Member of Clubs or Organizations: Not on file  . Attends Archivist Meetings: Not on file  . Marital Status: Not on file  Intimate Partner Violence:   . Fear of Current or Ex-Partner: Not on file  . Emotionally Abused: Not on file  . Physically Abused: Not on file  . Sexually Abused: Not on file    Family History  Problem Relation Age of Onset  . Atrial fibrillation Mother   . Congestive Heart Failure Mother   . Depression Mother   . Heart disease Father   . Alcohol abuse Father   . Heart disease Brother   . Heart disease Brother   . Depression Sister   . Depression Daughter     ROS: no fevers or chills, productive cough, hemoptysis, dysphasia, odynophagia, melena, hematochezia, dysuria, hematuria, rash, seizure activity, orthopnea, PND, pedal edema, claudication. Remaining systems are negative.  Physical Exam: Well-developed well-nourished in no acute distress.  Skin is warm and dry.  HEENT is normal.  Neck is supple.  Chest is clear to auscultation with normal expansion.  Cardiovascular exam is regular rate and rhythm.  Abdominal exam nontender or distended. No masses palpated. Extremities show no edema. neuro grossly intact  ECG- personally reviewed  A/P  1 paroxysmal atrial fibrillation-continue beta-blocker and flecainide.  Continue Xarelto.  Check hemoglobin and renal function.  2  hypertension-blood pressure controlled.  Continue present medications.  3 hyperlipidemia-followed by primary care.  Continue statin.  Kirk Ruths, MD

## 2019-10-16 ENCOUNTER — Telehealth: Payer: Self-pay | Admitting: Pharmacist Clinician (PhC)/ Clinical Pharmacy Specialist

## 2019-10-16 ENCOUNTER — Encounter: Payer: Self-pay | Admitting: Family Medicine

## 2019-10-16 NOTE — Telephone Encounter (Signed)
Patient with diagnosis of atrial fibrillation on Xarelto for anticoagulation.    Procedure: epidural steroid injection Date of procedure: TBD  CHADS2-VASc score of  3 (HTN, AGE, female)  CrCl 121.6 Platelet count 243  Per office protocol, patient can hold Xarelto for 3 days prior to procedure.    Patient will not need bridging with Lovenox (enoxaparin) around procedure.

## 2019-10-16 NOTE — Telephone Encounter (Signed)
-----   Message from Lendon Colonel, NP sent at 10/15/2019  5:49 PM EST -----  ----- Message ----- From: Darreld Mclean, MD Sent: 10/15/2019   5:06 PM EST To: Lendon Colonel, NP  Hi- this patient is going to have a neuro procedure- I believe an epidural steroid injection- next month.  Are you guys ok with her stopping xarelto for 3 days prior? Thank you! Jess Copland

## 2019-10-16 NOTE — Telephone Encounter (Signed)
Dr. Lorelei Pont,  Per Pharmacy and our office protocol, "Patient can hold Xarelto for 3 days prior to procedure. Patient will not need bridging with Lovenox (enoxaparin) around procedure."  Please call with any questions.  Thank you! Marti Acebo

## 2019-10-21 ENCOUNTER — Ambulatory Visit: Payer: PPO | Admitting: Cardiology

## 2019-10-22 DIAGNOSIS — M79604 Pain in right leg: Secondary | ICD-10-CM | POA: Diagnosis not present

## 2019-10-22 DIAGNOSIS — M545 Low back pain: Secondary | ICD-10-CM | POA: Diagnosis not present

## 2019-10-22 DIAGNOSIS — R531 Weakness: Secondary | ICD-10-CM | POA: Diagnosis not present

## 2019-10-22 DIAGNOSIS — R262 Difficulty in walking, not elsewhere classified: Secondary | ICD-10-CM | POA: Diagnosis not present

## 2019-10-24 DIAGNOSIS — M545 Low back pain: Secondary | ICD-10-CM | POA: Diagnosis not present

## 2019-10-24 DIAGNOSIS — M79604 Pain in right leg: Secondary | ICD-10-CM | POA: Diagnosis not present

## 2019-10-24 DIAGNOSIS — R531 Weakness: Secondary | ICD-10-CM | POA: Diagnosis not present

## 2019-10-24 DIAGNOSIS — R262 Difficulty in walking, not elsewhere classified: Secondary | ICD-10-CM | POA: Diagnosis not present

## 2019-10-28 ENCOUNTER — Encounter: Payer: Self-pay | Admitting: Family Medicine

## 2019-10-28 DIAGNOSIS — R002 Palpitations: Secondary | ICD-10-CM

## 2019-10-28 DIAGNOSIS — I480 Paroxysmal atrial fibrillation: Secondary | ICD-10-CM

## 2019-10-28 DIAGNOSIS — M79604 Pain in right leg: Secondary | ICD-10-CM | POA: Diagnosis not present

## 2019-10-28 DIAGNOSIS — R262 Difficulty in walking, not elsewhere classified: Secondary | ICD-10-CM | POA: Diagnosis not present

## 2019-10-28 DIAGNOSIS — M545 Low back pain: Secondary | ICD-10-CM | POA: Diagnosis not present

## 2019-10-28 DIAGNOSIS — R531 Weakness: Secondary | ICD-10-CM | POA: Diagnosis not present

## 2019-10-29 MED ORDER — RIVAROXABAN 20 MG PO TABS: ORAL_TABLET | ORAL | 3 refills | Status: DC

## 2019-10-29 MED ORDER — ATENOLOL 50 MG PO TABS: 50.0000 mg | ORAL_TABLET | Freq: Every day | ORAL | 1 refills | Status: DC

## 2019-10-31 DIAGNOSIS — R531 Weakness: Secondary | ICD-10-CM | POA: Diagnosis not present

## 2019-10-31 DIAGNOSIS — M545 Low back pain: Secondary | ICD-10-CM | POA: Diagnosis not present

## 2019-10-31 DIAGNOSIS — M79604 Pain in right leg: Secondary | ICD-10-CM | POA: Diagnosis not present

## 2019-10-31 DIAGNOSIS — R262 Difficulty in walking, not elsewhere classified: Secondary | ICD-10-CM | POA: Diagnosis not present

## 2019-11-04 DIAGNOSIS — M79604 Pain in right leg: Secondary | ICD-10-CM | POA: Diagnosis not present

## 2019-11-04 DIAGNOSIS — R531 Weakness: Secondary | ICD-10-CM | POA: Diagnosis not present

## 2019-11-04 DIAGNOSIS — M545 Low back pain: Secondary | ICD-10-CM | POA: Diagnosis not present

## 2019-11-04 DIAGNOSIS — R262 Difficulty in walking, not elsewhere classified: Secondary | ICD-10-CM | POA: Diagnosis not present

## 2019-11-06 DIAGNOSIS — M79604 Pain in right leg: Secondary | ICD-10-CM | POA: Diagnosis not present

## 2019-11-06 DIAGNOSIS — M545 Low back pain: Secondary | ICD-10-CM | POA: Diagnosis not present

## 2019-11-06 DIAGNOSIS — R262 Difficulty in walking, not elsewhere classified: Secondary | ICD-10-CM | POA: Diagnosis not present

## 2019-11-06 DIAGNOSIS — R531 Weakness: Secondary | ICD-10-CM | POA: Diagnosis not present

## 2019-11-23 ENCOUNTER — Other Ambulatory Visit: Payer: Self-pay | Admitting: Family Medicine

## 2019-11-23 DIAGNOSIS — I480 Paroxysmal atrial fibrillation: Secondary | ICD-10-CM

## 2019-12-05 ENCOUNTER — Telehealth: Payer: Self-pay | Admitting: Family Medicine

## 2019-12-05 DIAGNOSIS — E034 Atrophy of thyroid (acquired): Secondary | ICD-10-CM

## 2019-12-05 MED ORDER — LEVOTHYROXINE SODIUM 125 MCG PO TABS: 125.0000 ug | ORAL_TABLET | Freq: Every day | ORAL | 3 refills | Status: DC

## 2019-12-05 NOTE — Telephone Encounter (Signed)
Medication:  levothyroxine (SYNTHROID) 125 MCG tablet   PT is out and needs meds today if possible.  She states he called it in to the pharmacy but I dont see it.     Has the patient contacted their pharmacy? Yes.   (If no, request that the patient contact the pharmacy for the refill.) (If yes, when and what did the pharmacy advise?)  Preferred Pharmacy (with phone number or street name):   [id number] [place],  - [address] AT [place]: Please be advised that RX refills may take up to 3 business days. We ask that you follow-up with your pharmacy.

## 2019-12-05 NOTE — Telephone Encounter (Signed)
Spoke with patient and she does not use [facility] any more.  [facility]

## 2019-12-09 ENCOUNTER — Telehealth: Payer: Self-pay

## 2019-12-09 DIAGNOSIS — I480 Paroxysmal atrial fibrillation: Secondary | ICD-10-CM

## 2019-12-09 MED ORDER — FLECAINIDE ACETATE 100 MG PO TABS: 100.0000 mg | ORAL_TABLET | Freq: Two times a day (BID) | ORAL | 3 refills | Status: DC

## 2019-12-09 NOTE — Telephone Encounter (Signed)
Patient called in to see if Dr. Lorelei Pont could send in a prescription  For flecainide (TAMBOCOR) 100 MG tablet [id number]  Please send it to: [facility] [address] [place], [place] [address]  Phone: [phone]

## 2019-12-24 NOTE — Progress Notes (Signed)
HPI: FU atrial fibrillation. Monitor March 2017 showed sinus rhythm with paroxysmal atrial fibrillation.  Patient treated with flecainide.  Exercise treadmill October 2018 showed no exercise-induced ventricular tachycardia; no ST changes noted.  Echocardiogram at [facility] 2019 showed normal LV function, severe left atrial enlargement, mild RV dysfunction, mild mitral regurgitation.  Abdominal ultrasound November 2020 showed no aneurysm.  Since last seen,  she denies dyspnea, chest pain, palpitations, syncope or bleeding.  Current Outpatient Medications  Medication Sig Dispense Refill  . amLODipine (NORVASC) 5 MG tablet Take 1 tablet (5 mg total) by mouth daily. 90 tablet 3  . atenolol (TENORMIN) 50 MG tablet Take 1 tablet (50 mg total) by mouth daily. 90 tablet 1  . BIOTIN PO Take 1 tablet by mouth daily.    . Calcium Carb-Cholecalciferol (CALCIUM 600 + D PO) Take by mouth.    . flecainide (TAMBOCOR) 100 MG tablet Take 1 tablet (100 mg total) by mouth 2 (two) times daily. 180 tablet 3  . Krill Oil 1000 MG CAPS Take 1 capsule by mouth daily.    [name] lactobacillus acidophilus (BACID) TABS tablet Take 1 tablet by mouth daily.    [name] levothyroxine (SYNTHROID) 125 MCG tablet Take 1 tablet (125 mcg total) by mouth daily before breakfast. 90 tablet 3  . lovastatin (MEVACOR) 40 MG tablet TAKE 1 TABLET BY MOUTH EVERYDAY AT BEDTIME 90 tablet 0  . LUTEIN PO Take 1 capsule by mouth daily.    . magnesium 30 MG tablet Take 30 mg by mouth daily.    . methocarbamol (ROBAXIN) 500 MG tablet Take 1 tablet (500 mg total) by mouth every 8 (eight) hours as needed for muscle spasms. 40 tablet 0  . omeprazole (PRILOSEC) 40 MG capsule Take 1 capsule (40 mg total) by mouth 2 (two) times daily. 180 capsule 3  . Potassium 75 MG TABS Take 1 tablet by mouth daily.    . traZODone (DESYREL) 50 MG tablet Take 1.5 tablets (75 mg total) by mouth at bedtime. 135 tablet 0  . Turmeric 450 MG CAPS Take 1 capsule by mouth  daily.    [name] 20 MG TABS tablet TAKE 1 TABLET BY MOUTH DAILY WITH SUPPER 90 tablet 1  . FLUoxetine (PROZAC) 40 MG capsule Take 1 capsule (40 mg total) by mouth 2 (two) times daily. 180 capsule 3   No current facility-administered medications for this visit.     Past Medical History:  Diagnosis Date  . Atrial fibrillation ([place])   . Depression   . Diverticulitis   . Diverticulosis   . Esophageal ulcer   . Gait abnormality 10/14/2018  . GERD (gastroesophageal reflux disease)   . Hyperlipidemia   . Hypertension   . Hypothyroid   . Memory difficulty 08/18/2019  . OSA (obstructive sleep apnea) 08/31/2016  . Osteopenia   . 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 BREAST BIOPSY Left    needle core biopsy, benign  . CATARACT EXTRACTION    . HAND SURGERY    . KNEE SURGERY    . ROTATOR CUFF REPAIR    . TOTAL ABDOMINAL HYSTERECTOMY      Social History   Socioeconomic History  . Marital status: Married    Spouse name: Jenny Reichmann  . Number of children: 1  . Years of education: College  . Highest education level: Not on file  Occupational History  . Occupation: Youth worker.  Employer: OTHER    Comment: [employer]  Tobacco Use  . Smoking status: Never Smoker  . Smokeless tobacco: Never Used  Substance and Sexual Activity  . Alcohol use: Yes    Comment: rare  . Drug use: No  . Sexual activity: Not on file  Other Topics Concern  . Not on file  Social History Narrative   Patient lives at home spouse.   Caffeine use: 2 sodas weekly   Right handed    Social Determinants of Health   Financial Resource Strain:   . Difficulty of Paying Living Expenses:   Food Insecurity:   . Worried About Charity fundraiser in the Last Year:   . Arboriculturist in the Last Year:   Transportation Needs:   . Film/video editor (Medical):   [name] Lack of Transportation (Non-Medical):   Physical Activity:   . Days of  Exercise per Week:   . Minutes of Exercise per Session:   Stress:   . Feeling of Stress :   Social Connections:   . Frequency of Communication with Friends and Family:   . Frequency of Social Gatherings with Friends and Family:   . Attends Religious Services:   . Active Member of Clubs or Organizations:   . Attends Archivist Meetings:   [name] Marital Status:   Intimate Partner Violence:   . Fear of Current or Ex-Partner:   . Emotionally Abused:   [name] Physically Abused:   . Sexually Abused:     Family History  Problem Relation Age of Onset  . Atrial fibrillation Mother   . Congestive Heart Failure Mother   . Depression Mother   . Heart disease Father   . Alcohol abuse Father   . Heart disease Brother   . Heart disease Brother   . Depression Sister   . Depression Daughter     ROS: Back pain but no fevers or chills, productive cough, hemoptysis, dysphasia, odynophagia, melena, hematochezia, dysuria, hematuria, rash, seizure activity, orthopnea, PND, pedal edema, claudication. Remaining systems are negative.  Physical Exam: Well-developed well-nourished in no acute distress.  Skin is warm and dry.  HEENT is normal.  Neck is supple.  Chest is clear to auscultation with normal expansion.  Cardiovascular exam is regular rate and rhythm.  Abdominal exam nontender or distended. No masses palpated. Extremities show no edema. neuro grossly intact  A/P  1 paroxysmal atrial fibrillation-patient doing well with no recurrences at present.  Continue atenolol and flecainide.  Continue Xarelto.  2 hypertension-patient's blood pressure is controlled today.  Continue present medical regimen.  3 hyperlipidemia-her primary care physician is requesting changing to Crestor.  Discontinue lovastatin and begin Crestor 20 mg daily.  Check lipids and liver in 12 weeks.  Kirk Ruths, MD

## 2019-12-29 ENCOUNTER — Ambulatory Visit (INDEPENDENT_AMBULATORY_CARE_PROVIDER_SITE_OTHER): Payer: Medicare Other | Admitting: Cardiology

## 2019-12-29 ENCOUNTER — Encounter: Payer: Self-pay | Admitting: Cardiology

## 2019-12-29 ENCOUNTER — Other Ambulatory Visit: Payer: Self-pay

## 2019-12-29 VITALS — BP 132/68 | HR 53 | Temp 97.3°F | Resp 20 | Ht 61.5 in | Wt 197.8 lb

## 2019-12-29 DIAGNOSIS — E7800 Pure hypercholesterolemia, unspecified: Secondary | ICD-10-CM | POA: Diagnosis not present

## 2019-12-29 DIAGNOSIS — I480 Paroxysmal atrial fibrillation: Secondary | ICD-10-CM

## 2019-12-29 DIAGNOSIS — I10 Essential (primary) hypertension: Secondary | ICD-10-CM | POA: Diagnosis not present

## 2019-12-29 MED ORDER — ROSUVASTATIN CALCIUM 20 MG PO TABS: 20.0000 mg | ORAL_TABLET | Freq: Every day | ORAL | 3 refills | Status: DC

## 2019-12-29 NOTE — Patient Instructions (Signed)
Medication Instructions:  STOP LOVASTATIN  START ROSUVASTATIN 20 MG ONCE DAILY  *If you need a refill on your cardiac medications before your next appointment, please call your pharmacy*   Lab Work: Your physician recommends that you return for lab work in: [place]  If you have labs (blood work) drawn today and your tests are completely normal, you will receive your results only by: [name] MyChart Message (if you have MyChart) OR . A paper copy in the mail If you have any lab test that is abnormal or we need to change your treatment, we will call you to review the results.   Follow-Up: At [facility], you and your health needs are our priority.  As part of our continuing mission to provide you with exceptional heart care, we have created designated Provider Care Teams.  These Care Teams include your primary Cardiologist (physician) and Advanced Practice Providers (APPs -  Physician Assistants and Nurse Practitioners) who all work together to provide you with the care you need, when you need it.  We recommend signing up for the patient portal called "MyChart".  Sign up information is provided on this After Visit Summary.  MyChart is used to connect with patients for Virtual Visits (Telemedicine).  Patients are able to view lab/test results, encounter notes, upcoming appointments, etc.  Non-urgent messages can be sent to your provider as well.   To learn more about what you can do with MyChart, go to [email or web address].    Your next appointment:   12 month(s)  The format for your next appointment:   Either In Person or Virtual  Provider:   You may see Kirk Ruths, MD or one of the following Advanced Practice Providers on your designated Care Team:    Kerin Ransom, PA-C  [place], [place]  Coletta Memos, [place]

## 2020-01-01 ENCOUNTER — Telehealth: Payer: Self-pay | Admitting: Cardiology

## 2020-01-01 NOTE — Telephone Encounter (Signed)
Patient with diagnosis of afib on Xarelto for anticoagulation.    Procedure:  LSPINE TRANS FORAMINAL RIGHT L4-L5 INJECTION Date of procedure: 02/02/20  CHADS2-VASc score of  3 (HTN, AGE, female)  CrCl 88 ml/min  Per office protocol, patient can hold Xarelto for 3 days prior to procedure.

## 2020-01-01 NOTE — Telephone Encounter (Signed)
I s/w Hannah with [facility] and let her know I had not yet received the clearance fax and if she could please re-fax. Jarrett Soho asked if she could email the form. I informed her that we do not generally use our email for the clearance forms though if she can email it to me if she cannot get the fax to go through. [email or web address]

## 2020-01-01 NOTE — Telephone Encounter (Signed)
   [facility] Pre-operative Risk Assessment    Request for surgical clearance:  1. What type of surgery is being performed? LSPINE TRANS FORAMINAL RIGHT L4-L5 INJECTION  2. When is this surgery scheduled? 02/02/20   3. What type of clearance is required (medical clearance vs. Pharmacy clearance to hold med vs. Both)? BOTH  4. Are there any medications that need to be held prior to surgery and how long? XARELTO X 3 DAYS PRIOR TO INJECTION   5. Practice name and name of physician performing surgery? [facility]: DR. PAUL HARKINS   6. What is your office phone number [phone]    7.   What is your office fax number [phone]  8.   Anesthesia type (None, local, MAC, general) ? NONE LISTED   Julaine Hua 01/01/2020, 12:00 PM  _________________________________________________________________   (provider comments below)

## 2020-01-01 NOTE — Telephone Encounter (Signed)
I have not seen a clearance form come in for this pt.

## 2020-01-01 NOTE — Telephone Encounter (Signed)
   Primary Cardiologist: Kirk Ruths, MD  Chart reviewed as part of pre-operative protocol coverage. Given past medical history and time since last visit, based on ACC/AHA guidelines, Lisa Acosta would be at acceptable risk for the planned procedure without further cardiovascular testing.   Patient with diagnosis of afib on Xarelto for anticoagulation.    Procedure: LSPINE TRANS FORAMINAL RIGHT L4-L5 INJECTION Date of procedure: 02/02/20  CHADS2-VASc score of  3 (HTN, AGE, female)  CrCl 88 ml/min  Per office protocol, patient can hold Xarelto for 3 days prior to procedure.  I will route this recommendation to the requesting party via Epic fax function and remove from pre-op pool.  Please call with questions.  [name]. Nakema Fake NP-C    01/01/2020, 1:50 PM [address] Office [phone] Fax [phone]

## 2020-01-01 NOTE — Telephone Encounter (Signed)
New Message:     Jarrett Soho From [facility] called. She wanted to know if you received clearance that she faxed on 12-31-19?

## 2020-01-12 ENCOUNTER — Other Ambulatory Visit: Payer: Self-pay

## 2020-01-22 ENCOUNTER — Encounter: Payer: Self-pay | Admitting: Family Medicine

## 2020-02-03 ENCOUNTER — Encounter: Payer: Self-pay | Admitting: Family Medicine

## 2020-02-03 DIAGNOSIS — G4700 Insomnia, unspecified: Secondary | ICD-10-CM

## 2020-02-03 MED ORDER — TRAZODONE HCL 50 MG PO TABS: 75.0000 mg | ORAL_TABLET | Freq: Every day | ORAL | 3 refills | Status: DC

## 2020-02-03 MED ORDER — OMEPRAZOLE 40 MG PO CPDR: 40.0000 mg | DELAYED_RELEASE_CAPSULE | Freq: Two times a day (BID) | ORAL | 3 refills | Status: DC

## 2020-02-19 ENCOUNTER — Ambulatory Visit: Payer: Medicare Other | Admitting: Neurology

## 2020-02-19 ENCOUNTER — Encounter: Payer: Self-pay | Admitting: Neurology

## 2020-02-19 ENCOUNTER — Telehealth: Payer: Self-pay | Admitting: Neurology

## 2020-02-19 NOTE — Telephone Encounter (Signed)
This patient did not show for a RV today.

## 2020-03-01 ENCOUNTER — Encounter: Payer: Self-pay | Admitting: Family Medicine

## 2020-03-01 DIAGNOSIS — R413 Other amnesia: Secondary | ICD-10-CM

## 2020-03-03 IMAGING — CR DG CHEST 2V
2 series · 2 of 2 positions shown · non-contrast
Comparison: Radiographs 12/15/2016. CT 07/29/2015.

CLINICAL DATA: Low back and right hip pain for 2 days. Sore throat.

EXAM:
CHEST - 2 VIEW

[w chest pa]
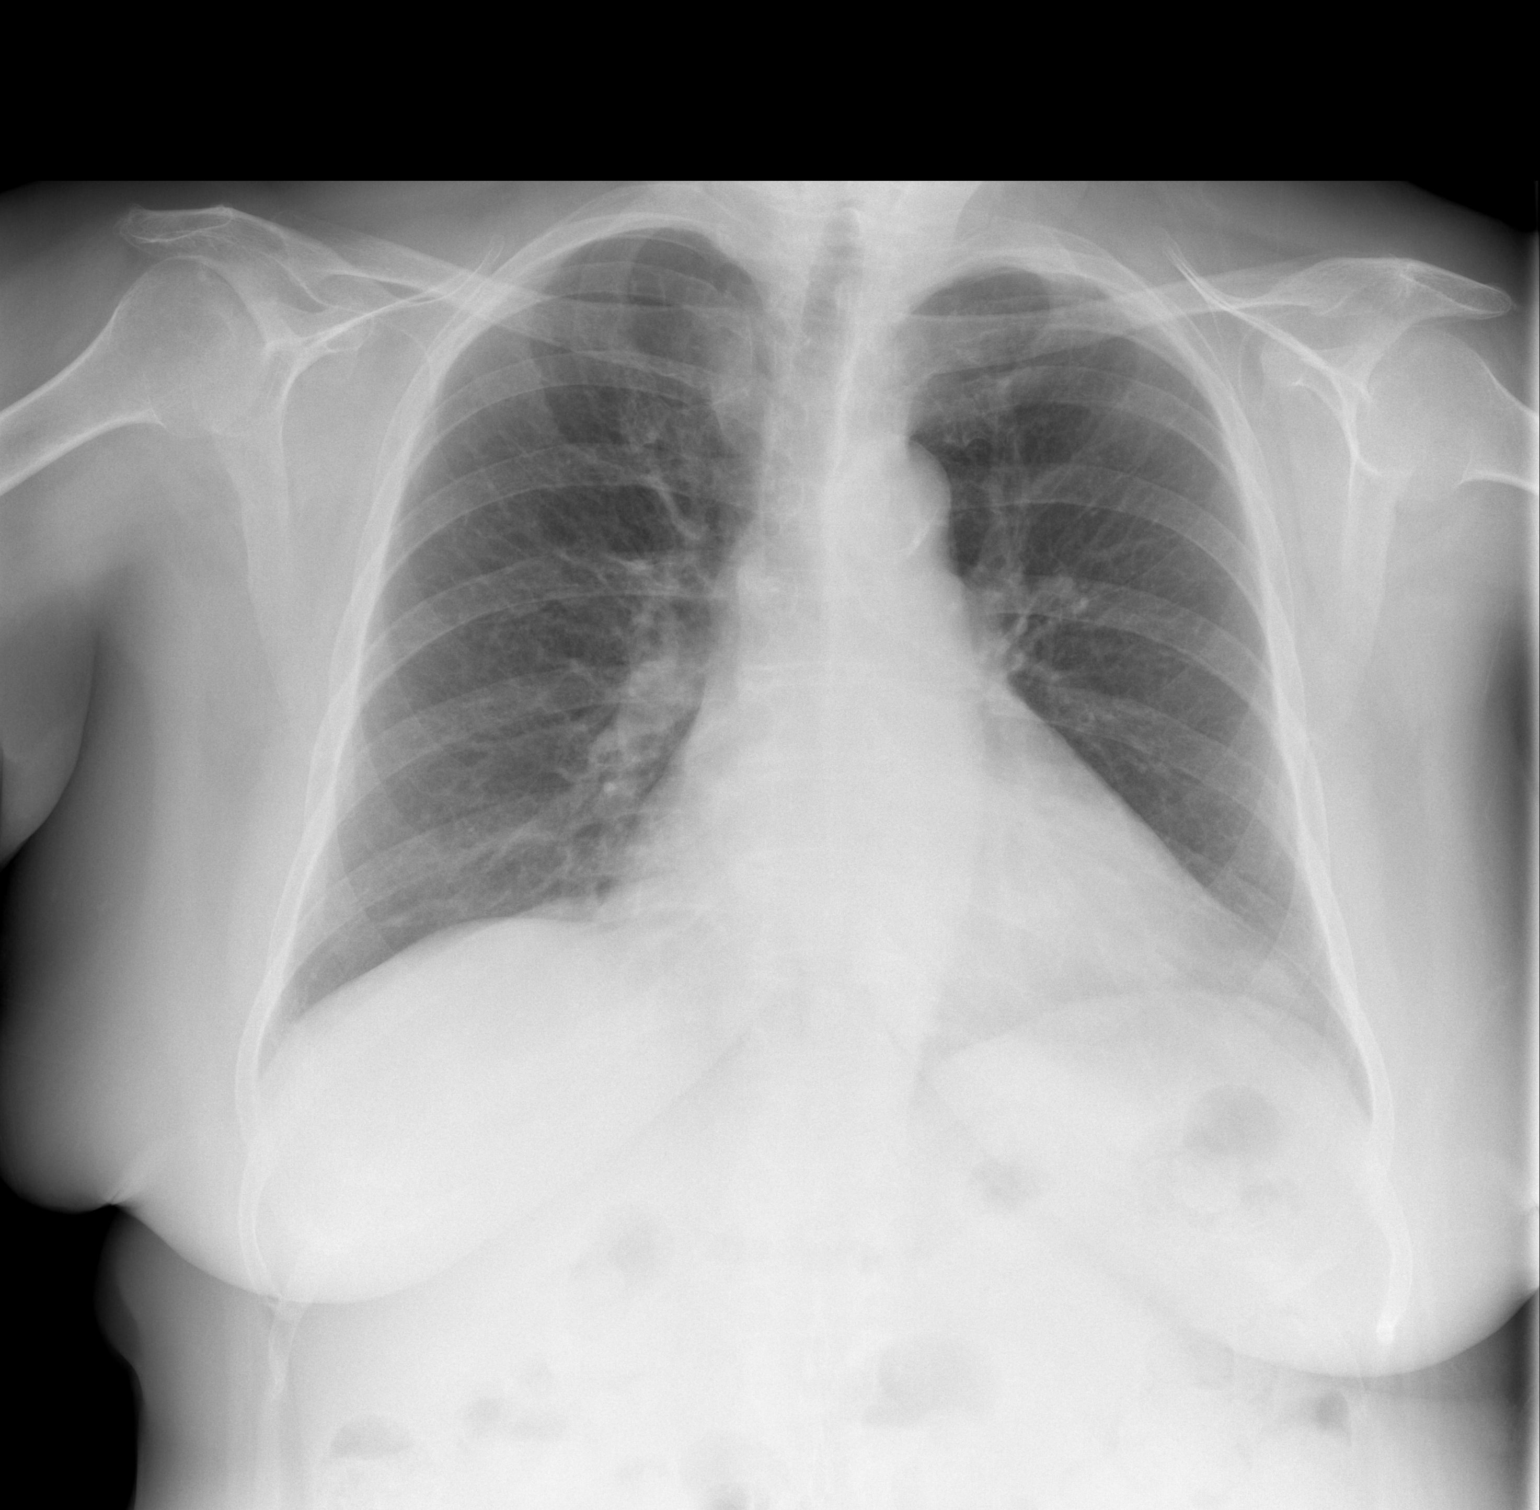

[w chest lat]
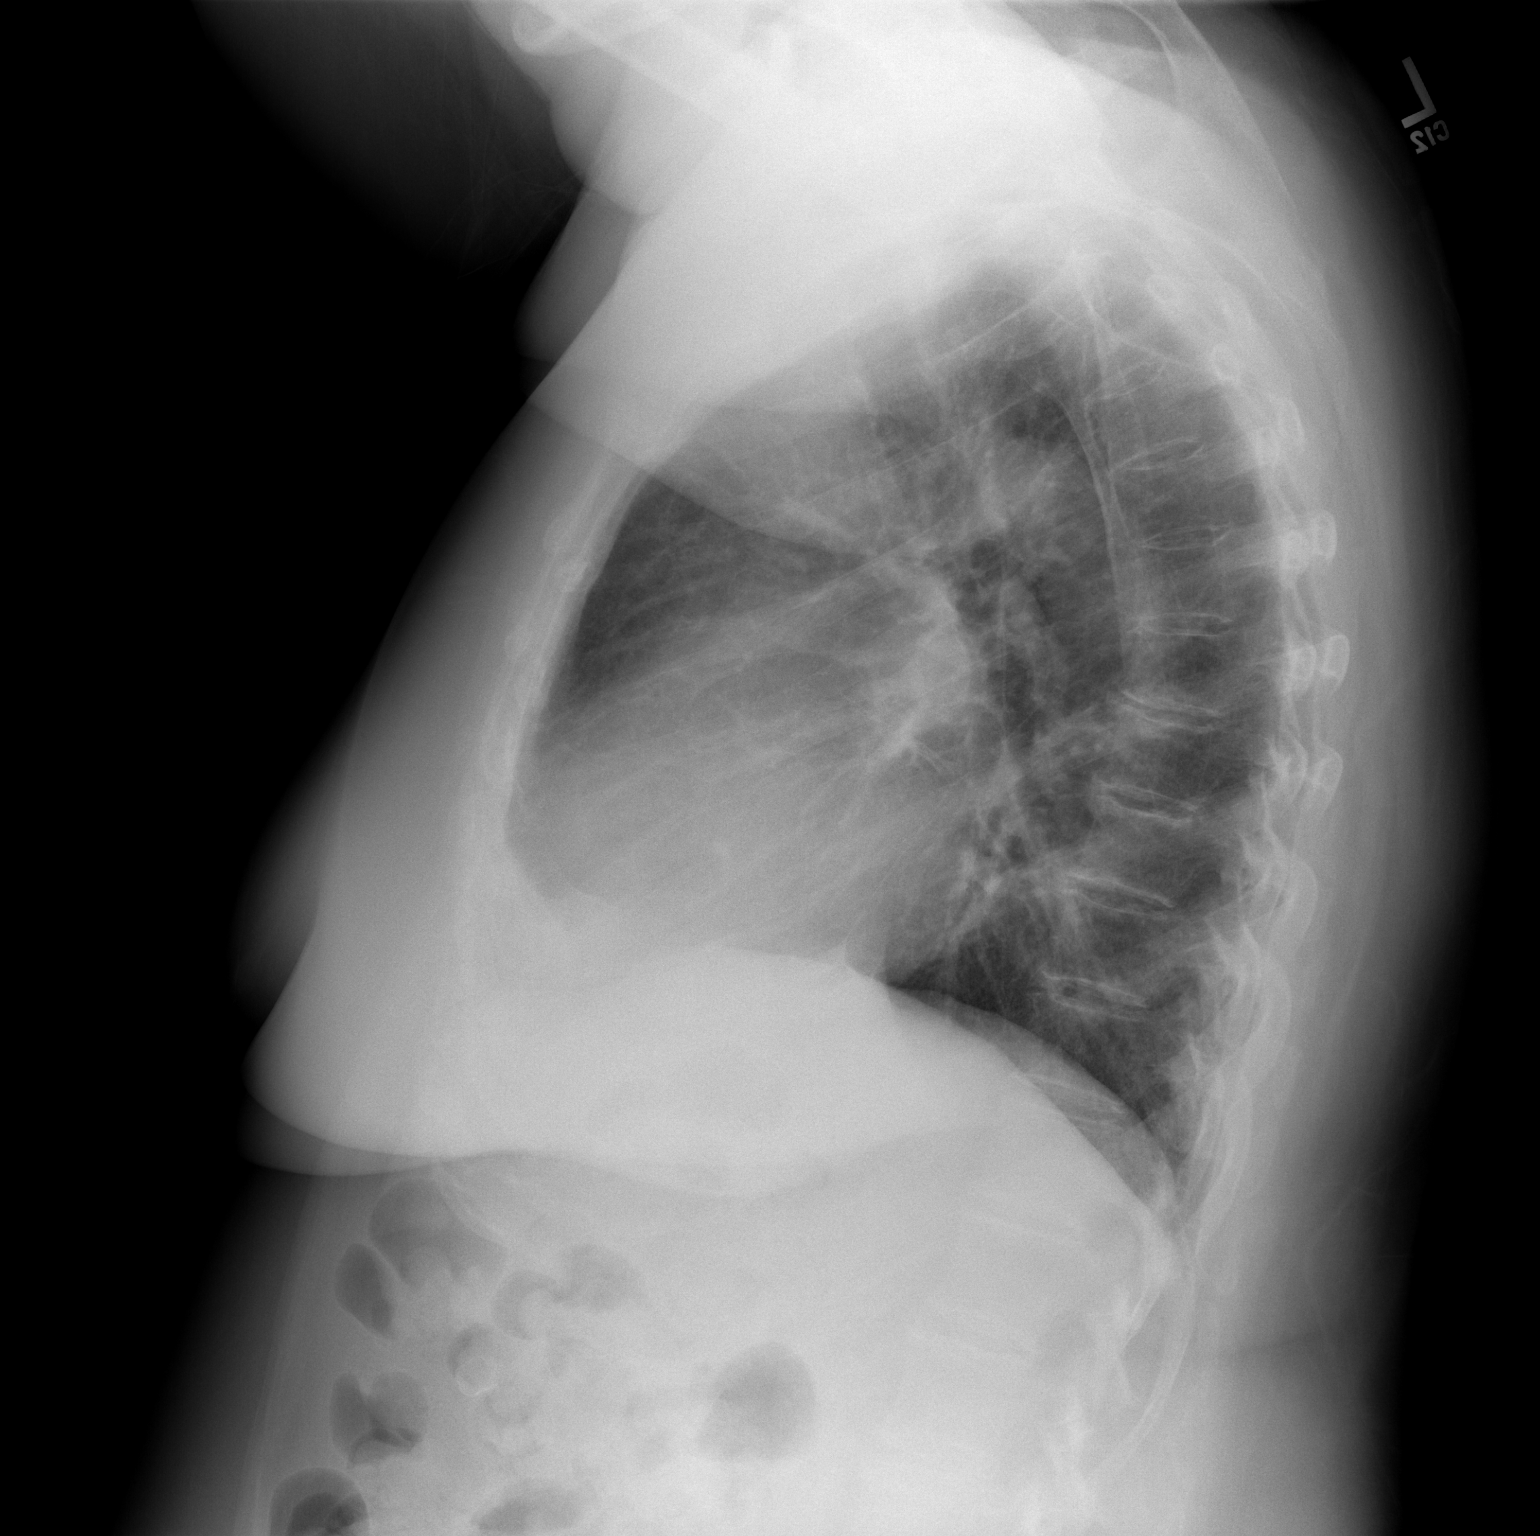

[2 of 2 positions shown; findings below may reference images not displayed]

FINDINGS: The heart size is stable at the upper limits of normal. There is
aortic atherosclerosis and a probable coronary artery stent. The
lungs are clear. There is no pleural effusion or pneumothorax. No
acute osseous findings.
IMPRESSION: Stable chest. No active cardiopulmonary process.

## 2020-03-03 IMAGING — CR DG HIP (WITH OR WITHOUT PELVIS) 2-3V*R*
3 series · 3 of 3 positions shown · non-contrast
Comparison: Abdominopelvic CT 08/29/2019.

CLINICAL DATA: Low back and right hip pain for 2 days.

EXAM:
DG HIP (WITH OR WITHOUT PELVIS) 2-3V RIGHT

[t pelvis a.p.]
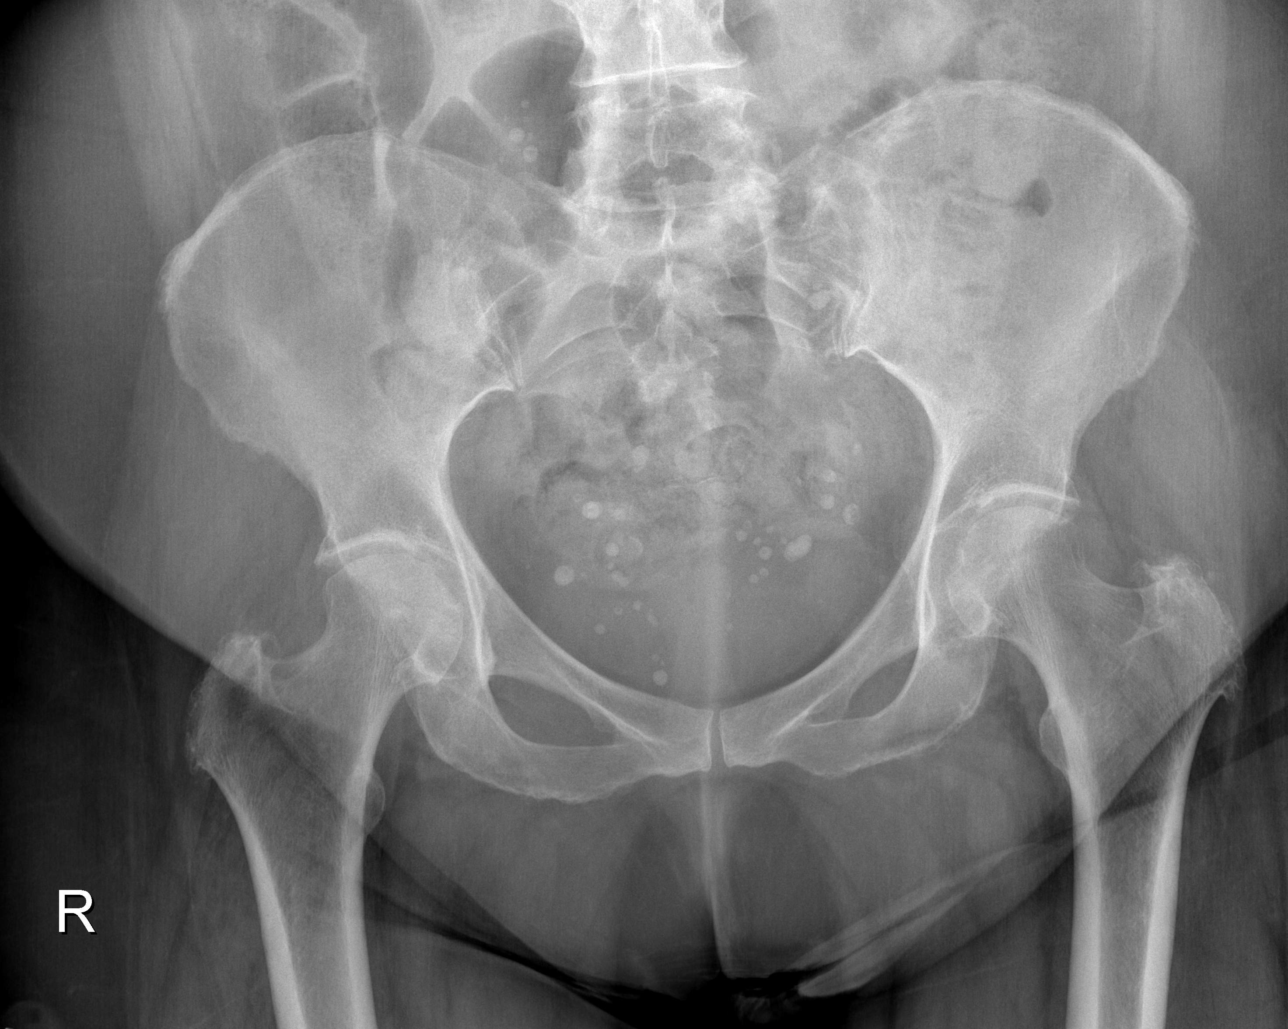

[t hip ap right]
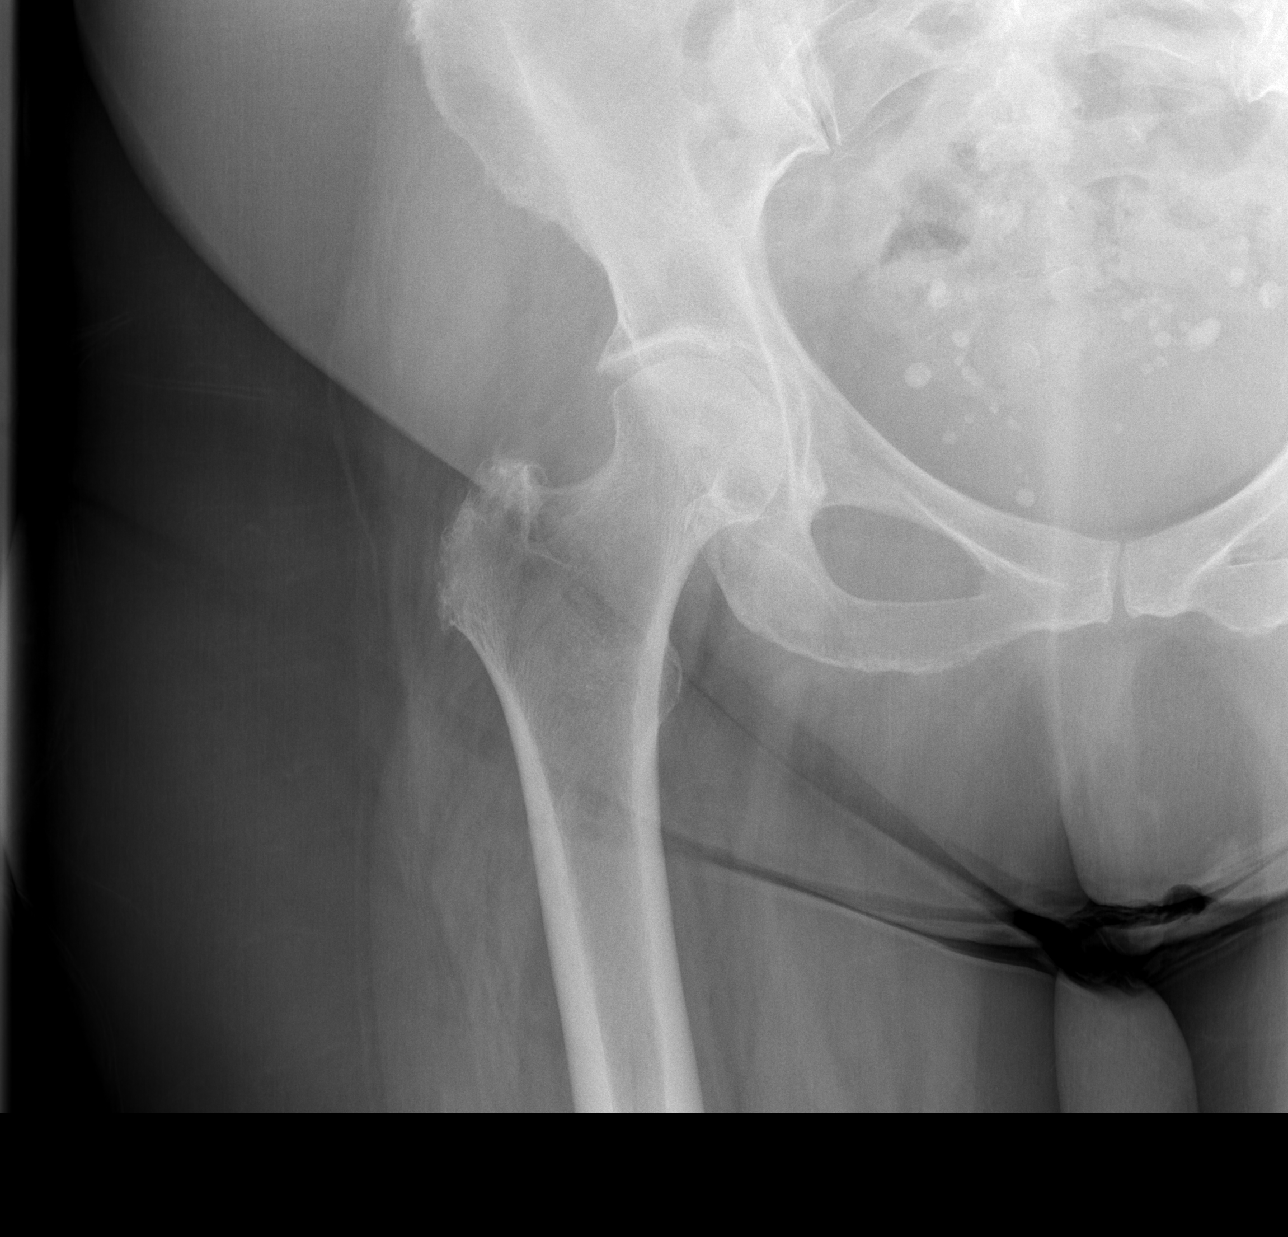

[t hip frog leg right]
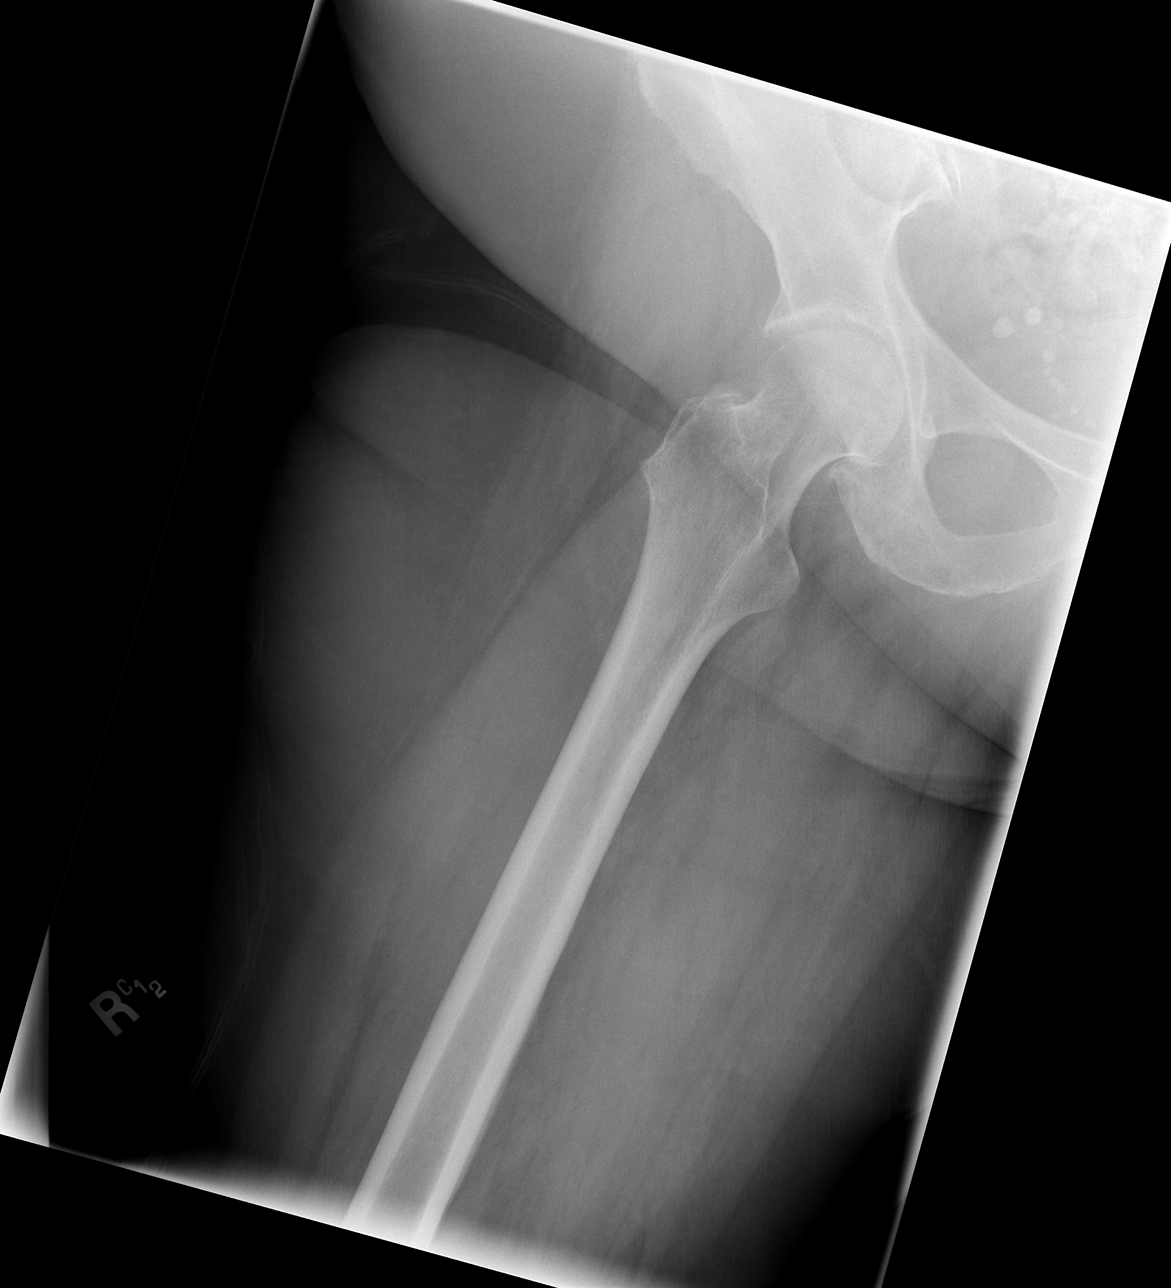

[3 of 3 positions shown; findings below may reference images not displayed]

FINDINGS: The mineralization and alignment are normal. There is no evidence of
acute fracture or dislocation. No evidence of femoral head avascular
necrosis. No significant hip arthropathy for age. There are mild
degenerative changes within the lower lumbar spine. Numerous pelvic
calcifications bilaterally are grossly stable.
IMPRESSION: No acute osseous findings or significant arthropathic changes in the
right hip.

## 2020-03-25 ENCOUNTER — Other Ambulatory Visit: Payer: Self-pay | Admitting: Family Medicine

## 2020-03-25 DIAGNOSIS — R002 Palpitations: Secondary | ICD-10-CM

## 2020-04-05 ENCOUNTER — Encounter: Payer: Self-pay | Admitting: *Deleted

## 2020-04-23 DIAGNOSIS — M25559 Pain in unspecified hip: Secondary | ICD-10-CM | POA: Diagnosis not present

## 2020-04-23 DIAGNOSIS — M5136 Other intervertebral disc degeneration, lumbar region: Secondary | ICD-10-CM | POA: Diagnosis not present

## 2020-04-23 DIAGNOSIS — Z6837 Body mass index (BMI) 37.0-37.9, adult: Secondary | ICD-10-CM | POA: Diagnosis not present

## 2020-04-25 ENCOUNTER — Other Ambulatory Visit: Payer: Self-pay | Admitting: Family Medicine

## 2020-04-25 DIAGNOSIS — I480 Paroxysmal atrial fibrillation: Secondary | ICD-10-CM

## 2020-04-27 ENCOUNTER — Encounter: Payer: Self-pay | Admitting: Family Medicine

## 2020-04-27 MED ORDER — OMEPRAZOLE 40 MG PO CPDR: 40.0000 mg | DELAYED_RELEASE_CAPSULE | Freq: Two times a day (BID) | ORAL | 3 refills | Status: DC

## 2020-04-30 LAB — HEPATIC FUNCTION PANEL
ALT: 36 IU/L — ABNORMAL HIGH (ref 0–32)
AST: 32 IU/L (ref 0–40)
Albumin: 4.3 g/dL (ref 3.8–4.8)
Alkaline Phosphatase: 132 IU/L — ABNORMAL HIGH (ref 48–121)
Bilirubin Total: 1 mg/dL (ref 0.0–1.2)
Bilirubin, Direct: 0.24 mg/dL (ref 0.00–0.40)
Total Protein: 6.8 g/dL (ref 6.0–8.5)

## 2020-04-30 LAB — LIPID PANEL
Chol/HDL Ratio: 2.4 ratio (ref 0.0–4.4)
Cholesterol, Total: 186 mg/dL (ref 100–199)
HDL: 76 mg/dL (ref >39)
LDL Chol Calc (NIH): 96 mg/dL (ref 0–99)
Triglycerides: 76 mg/dL (ref 0–149)
VLDL Cholesterol Cal: 14 mg/dL (ref 5–40)

## 2020-05-03 ENCOUNTER — Other Ambulatory Visit: Payer: Self-pay | Admitting: *Deleted

## 2020-05-03 DIAGNOSIS — R748 Abnormal levels of other serum enzymes: Secondary | ICD-10-CM

## 2020-06-11 ENCOUNTER — Emergency Department (HOSPITAL_BASED_OUTPATIENT_CLINIC_OR_DEPARTMENT_OTHER): Payer: Medicare Other

## 2020-06-11 ENCOUNTER — Other Ambulatory Visit: Payer: Self-pay

## 2020-06-11 ENCOUNTER — Encounter (HOSPITAL_BASED_OUTPATIENT_CLINIC_OR_DEPARTMENT_OTHER): Payer: Self-pay | Admitting: *Deleted

## 2020-06-11 ENCOUNTER — Emergency Department (HOSPITAL_BASED_OUTPATIENT_CLINIC_OR_DEPARTMENT_OTHER)
Admission: EM | Admit: 2020-06-11 | Discharge: 2020-06-11 | Disposition: A | Discharge status: Home / Self Care | Payer: Medicare Other | Attending: Emergency Medicine | Admitting: Emergency Medicine

## 2020-06-11 DIAGNOSIS — S0990XA Unspecified injury of head, initial encounter: Secondary | ICD-10-CM

## 2020-06-11 DIAGNOSIS — Z79899 Other long term (current) drug therapy: Secondary | ICD-10-CM | POA: Insufficient documentation

## 2020-06-11 DIAGNOSIS — I10 Essential (primary) hypertension: Secondary | ICD-10-CM | POA: Insufficient documentation

## 2020-06-11 DIAGNOSIS — Y92513 Shop (commercial) as the place of occurrence of the external cause: Secondary | ICD-10-CM | POA: Diagnosis not present

## 2020-06-11 DIAGNOSIS — M545 Low back pain, unspecified: Secondary | ICD-10-CM | POA: Diagnosis not present

## 2020-06-11 DIAGNOSIS — W07XXXA Fall from chair, initial encounter: Secondary | ICD-10-CM | POA: Diagnosis not present

## 2020-06-11 DIAGNOSIS — E039 Hypothyroidism, unspecified: Secondary | ICD-10-CM | POA: Insufficient documentation

## 2020-06-11 DIAGNOSIS — W19XXXA Unspecified fall, initial encounter: Secondary | ICD-10-CM

## 2020-06-11 MED ORDER — HYDROCODONE-ACETAMINOPHEN 5-325 MG PO TABS
1.0000 | ORAL_TABLET | Freq: Once | ORAL | Status: AC
  Administered 2020-06-11: 1 via ORAL
  Filled 2020-06-11: qty 1

## 2020-06-11 NOTE — ED Triage Notes (Signed)
She was at the salon and a chair with wheels slid out from under her causing her to fall and hit the back of her head. She has a severe headache. She takes Xarelto.

## 2020-06-11 NOTE — ED Provider Notes (Signed)
[facility] EMERGENCY DEPARTMENT Provider Note   CSN: [id number] Arrival date & time: 06/11/20  1350     History Chief Complaint  Patient presents with  . Fall  . Head Injury    Lisa Acosta is a 69 y.o. female history of A. fib on Xarelto, GERD, obesity, hypertension, hyperlipidemia, osteopenia.  Patient reports that just prior to arrival today she was at a salon and sitting in a rolling chair.  The chair slipped out from beneath her causing her to fall backwards striking her head on the ground she reports a moderate intensity generalized headache since that time constant nonradiating no aggravating or alleviating factors, no medications prior to arrival for symptoms.  Additionally patient reports some lower back pain since the fall described as a mild bilateral aching sensation nonradiating worsened with movement improved with rest.  Denies loss of consciousness, vision changes, nausea/vomiting, neck pain, chest pain, abdominal pain, numbness/weakness, tingling, saddle paresthesias, bowel/bladder incontinence, urinary retention, extremity pain or any additional concerns.  HPI     Past Medical History:  Diagnosis Date  . Atrial fibrillation ([place])   . Depression   . Diverticulitis   . Diverticulosis   . Esophageal ulcer   . Gait abnormality 10/14/2018  . GERD (gastroesophageal reflux disease)   . Hyperlipidemia   . Hypertension   . Hypothyroid   . Memory difficulty 08/18/2019  . OSA (obstructive sleep apnea) 08/31/2016  . Osteopenia   . 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 Memory difficulty 08/18/2019  . Right wrist pain 05/04/2019  . Mild episode of recurrent major depressive disorder ([place]) 12/11/2018  . Depression, major, single episode, mild ([place]) 10/22/2018  . Gait abnormality 10/14/2018  . 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 Moderate episode of recurrent major depressive  disorder ([place]) 09/26/2018  . Generalized anxiety disorder 09/26/2018  . Osteopenia 01/25/2018  . 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 Dyspnea 09/22/2016  . OSA (obstructive sleep apnea) 08/31/2016  . PAF (paroxysmal atrial fibrillation) ([place]) 03/16/2016  . Hypothyroid 03/16/2016  . Obesity (BMI 30-39.9) 03/16/2016  . Essential hypertension 11/10/2015  . Hyperlipidemia 11/10/2015    Past Surgical History:  Procedure Laterality Date  . BREAST BIOPSY Left    needle core biopsy, benign  . CATARACT EXTRACTION    . HAND SURGERY    . KNEE SURGERY    . ROTATOR CUFF REPAIR    . TOTAL ABDOMINAL HYSTERECTOMY       OB History   No obstetric history on file.     Family History  Problem Relation Age of Onset  . Atrial fibrillation Mother   . Congestive Heart Failure Mother   . Depression Mother   . Heart disease Father   . Alcohol abuse Father   . Heart disease Brother   . Heart disease Brother   . Depression Sister   . Depression Daughter     Social History   Tobacco Use  . Smoking status: Never Smoker  . Smokeless tobacco: Never Used  Vaping Use  . Vaping Use: Never used  Substance Use Topics  . Alcohol use: Yes    Comment: rare  . Drug use: No    Home Medications Prior to Admission medications   Medication Sig Start Date End Date Taking? Authorizing Provider  amLODipine (NORVASC) 5 MG tablet Take 1 tablet (5 mg total) by mouth daily. 10/14/19   Copland, Gay Filler, MD  atenolol (  TENORMIN) 50 MG tablet TAKE 1 TABLET DAILY 03/25/20   Copland, Gay Filler, MD  BIOTIN PO Take 1 tablet by mouth daily.    [provider]  Calcium Carb-Cholecalciferol (CALCIUM 600 + D PO) Take by mouth.    [provider]  flecainide (TAMBOCOR) 100 MG tablet Take 1 tablet (100 mg total) by mouth 2 (two) times daily. 12/09/19   Copland, Gay Filler, MD  FLUoxetine (PROZAC) 40 MG capsule Take 1 capsule (40 mg total) by mouth 2 (two) times daily. 07/22/19 10/20/19   Copland, Gay Filler, MD  Krill Oil 1000 MG CAPS Take 1 capsule by mouth daily.    [provider]  lactobacillus acidophilus (BACID) TABS tablet Take 1 tablet by mouth daily.    [provider]  levothyroxine (SYNTHROID) 125 MCG tablet Take 1 tablet (125 mcg total) by mouth daily before breakfast. 12/05/19   Copland, Gay Filler, MD  LUTEIN PO Take 1 capsule by mouth daily.    [provider]  magnesium 30 MG tablet Take 30 mg by mouth daily.    [provider]  methocarbamol (ROBAXIN) 500 MG tablet Take 1 tablet (500 mg total) by mouth every 8 (eight) hours as needed for muscle spasms. 06/23/19   Copland, Gay Filler, MD  omeprazole (PRILOSEC) 40 MG capsule Take 1 capsule (40 mg total) by mouth 2 (two) times daily. 04/27/20   Copland, Gay Filler, MD  Potassium 75 MG TABS Take 1 tablet by mouth daily.    [provider]  rosuvastatin (CRESTOR) 20 MG tablet Take 1 tablet (20 mg total) by mouth daily. 12/29/19 03/28/20  Lelon Perla, MD  traZODone (DESYREL) 50 MG tablet Take 1.5 tablets (75 mg total) by mouth at bedtime. 02/03/20 05/03/20  Copland, Gay Filler, MD  Turmeric 450 MG CAPS Take 1 capsule by mouth daily.    [provider]  XARELTO 20 MG TABS tablet TAKE 1 TABLET BY MOUTH DAILY WITH SUPPER 04/26/20   Copland, Gay Filler, MD    Allergies    Dextran, Dextrans, Dextromethorphan hbr, Tree extract, Penicillin g, Penicillins, Sulfa antibiotics, and Sulfacetamide sodium  Review of Systems   Review of Systems Ten systems are reviewed and are negative for acute change except as noted in the HPI  Physical Exam Updated Vital Signs BP (!) 150/70   Pulse (!) 50   Temp 97.7 F (36.5 C) (Oral)   Resp 14   Ht 5' 1.5" (1.562 m)   Wt 88.5 kg   SpO2 98%   BMI 36.25 kg/m   Physical Exam Constitutional:      General: She is not in acute distress.    Appearance: Normal appearance. She is well-developed. She is not ill-appearing or diaphoretic.   HENT:     Head: Normocephalic and atraumatic.     Jaw: There is normal jaw occlusion.     Right Ear: External ear normal. No hemotympanum.     Left Ear: External ear normal. No hemotympanum.     Nose: Nose normal.     Mouth/Throat:     Mouth: Mucous membranes are moist.     Pharynx: Oropharynx is clear.  Eyes:     General: Vision grossly intact. Gaze aligned appropriately.     Extraocular Movements: Extraocular movements intact.     Conjunctiva/sclera: Conjunctivae normal.     Pupils: Pupils are equal, round, and reactive to light.  Neck:     Trachea: Trachea and phonation normal.  Cardiovascular:  Rate and Rhythm: Normal rate and regular rhythm.     Pulses:          Dorsalis pedis pulses are 2+ on the right side and 2+ on the left side.  Pulmonary:     Effort: Pulmonary effort is normal. No accessory muscle usage or respiratory distress.     Breath sounds: Normal breath sounds and air entry.  Chest:     Chest wall: No deformity, tenderness or crepitus.  Abdominal:     General: There is no distension.     Palpations: Abdomen is soft.     Tenderness: There is no abdominal tenderness. There is no guarding or rebound.  Musculoskeletal:        General: Normal range of motion.     Cervical back: Normal range of motion and neck supple. No spinous process tenderness or muscular tenderness.       Back:     Comments: No midline C/T/L spinal tenderness to palpation, no deformity, crepitus, or step-off noted. No sign of injury to the neck or back. - Bilateral paraspinal lumbar muscular tenderness without overlying skin change.  Feet:     Right foot:     Protective Sensation: 3 sites tested. 3 sites sensed.     Left foot:     Protective Sensation: 3 sites tested. 3 sites sensed.  Skin:    General: Skin is warm and dry.  Neurological:     Mental Status: She is alert.     GCS: GCS eye subscore is 4. GCS verbal subscore is 5. GCS motor subscore is 6.     Comments: Speech is clear  and goal oriented, follows commands Major Cranial nerves without deficit, no facial droop Normal strength in upper and lower extremities bilaterally including dorsiflexion and plantar flexion, strong and equal grip strength Sensation intact to extremities x4 Moves extremities without ataxia, coordination intact Steady gait with cane  Psychiatric:        Behavior: Behavior normal.     ED Results / Procedures / Treatments   Labs (all labs ordered are listed, but only abnormal results are displayed) Labs Reviewed - No data to display  EKG None  Radiology DG Lumbar Spine Complete  Result Date: 06/11/2020 CLINICAL DATA:  Low back pain after fall today. EXAM: LUMBAR SPINE - COMPLETE 4+ VIEW COMPARISON:  October 01, 2019. FINDINGS: No fracture or spondylolisthesis is noted. Mild degenerative disc disease is noted at L4-5. IMPRESSION: Mild degenerative disc disease at L4-5. No acute abnormality is noted. Electronically Signed   By: Marijo Conception M.D.   On: 06/11/2020 15:38   DG Sacrum/Coccyx  Result Date: 06/11/2020 CLINICAL DATA:  Status post fall. EXAM: SACRUM AND COCCYX - 2+ VIEW COMPARISON:  None. FINDINGS: There is no evidence of fracture or other focal bone lesions. Mild to moderate severity degenerative changes seen within the visualized portion of the lower lumbar spine. IMPRESSION: No acute osseous abnormality. CT correlation is recommended if acute fracture remains of clinical concern. Electronically Signed   By: Virgina Norfolk M.D.   On: 06/11/2020 17:30   CT Head Wo Contrast  Result Date: 06/11/2020 CLINICAL DATA:  Posttraumatic headache after fall. EXAM: CT HEAD WITHOUT CONTRAST CT CERVICAL SPINE WITHOUT CONTRAST TECHNIQUE: Multidetector CT imaging of the head and cervical spine was performed following the standard protocol without intravenous contrast. Multiplanar CT image reconstructions of the cervical spine were also generated. COMPARISON:  August 13, 2019. FINDINGS: CT  HEAD FINDINGS Brain: No evidence of  acute infarction, hemorrhage, hydrocephalus, extra-axial collection or mass lesion/mass effect. Vascular: No hyperdense vessel or unexpected calcification. Skull: Normal. Negative for fracture or focal lesion. Sinuses/Orbits: No acute finding. Other: None. CT CERVICAL SPINE FINDINGS Alignment: Minimal grade 1 anterolisthesis is noted at C4-5, C5-6 and C6-7 secondary to posterior facet joint hypertrophy. Skull base and vertebrae: No acute fracture. No primary bone lesion or focal pathologic process. Soft tissues and spinal canal: No prevertebral fluid or swelling. No visible canal hematoma. Disc levels: Mild degenerative disc disease is noted at C5-6, C6-7 and C7-T1. Upper chest: Negative. Other: Degenerative changes are seen involving the right-sided posterior facet joints. IMPRESSION: 1. Normal head CT. 2. Multilevel degenerative disc disease. No acute abnormality seen in the cervical spine. Electronically Signed   By: Marijo Conception M.D.   On: 06/11/2020 14:42   CT Cervical Spine Wo Contrast  Result Date: 06/11/2020 CLINICAL DATA:  Posttraumatic headache after fall. EXAM: CT HEAD WITHOUT CONTRAST CT CERVICAL SPINE WITHOUT CONTRAST TECHNIQUE: Multidetector CT imaging of the head and cervical spine was performed following the standard protocol without intravenous contrast. Multiplanar CT image reconstructions of the cervical spine were also generated. COMPARISON:  August 13, 2019. FINDINGS: CT HEAD FINDINGS Brain: No evidence of acute infarction, hemorrhage, hydrocephalus, extra-axial collection or mass lesion/mass effect. Vascular: No hyperdense vessel or unexpected calcification. Skull: Normal. Negative for fracture or focal lesion. Sinuses/Orbits: No acute finding. Other: None. CT CERVICAL SPINE FINDINGS Alignment: Minimal grade 1 anterolisthesis is noted at C4-5, C5-6 and C6-7 secondary to posterior facet joint hypertrophy. Skull base and vertebrae: No acute fracture. No  primary bone lesion or focal pathologic process. Soft tissues and spinal canal: No prevertebral fluid or swelling. No visible canal hematoma. Disc levels: Mild degenerative disc disease is noted at C5-6, C6-7 and C7-T1. Upper chest: Negative. Other: Degenerative changes are seen involving the right-sided posterior facet joints. IMPRESSION: 1. Normal head CT. 2. Multilevel degenerative disc disease. No acute abnormality seen in the cervical spine. Electronically Signed   By: Marijo Conception M.D.   On: 06/11/2020 14:42    Procedures Procedures (including critical care time)  Medications Ordered in ED Medications  HYDROcodone-acetaminophen (NORCO/VICODIN) 5-325 MG per tablet 1 tablet (1 tablet Oral Given 06/11/20 1549)    ED Course  I have reviewed the triage vital signs and the nursing notes.  Pertinent labs & imaging results that were available during my care of the patient were reviewed by me and considered in my medical decision making (see chart for details).  Clinical Course as of Jun 11 1820  Fri Oct 01, 589  222 69 year old female here after mechanical fall in which she struck her head.  She is on blood thinners.  CT showing no acute bleed.  She is awake and alert.  Will discharge with return precautions.   [MB]    Clinical Course User Index [MB] Hayden Rasmussen, MD   MDM Rules/Calculators/A&P                         Additional history obtained from: 1. Nursing notes from this visit. ------------------------ 69 year old female on Xarelto presented after fall with head injury just prior to arrival when a rolling chair slid out from under her.  She has a headache and some lower back pain.  CT head/cervical spine ordered in triage.  She has no external signs of injury no hemotympanum.  Normal neuro exam.  No pain of the neck, chest, abdomen,  pelvis or extremities.  She denies any neurologic complaint.  CT Head/Cspine:  IMPRESSION:  1. Normal head CT.  2. Multilevel degenerative  disc disease. No acute abnormality seen  in the cervical spine.   DG Lumbar Spine:    IMPRESSION:  Mild degenerative disc disease at L4-5. No acute abnormality is  noted.   DG Sacrum/Coccyx:  IMPRESSION:  No acute osseous abnormality. CT correlation is recommended if acute  fracture remains of clinical concern.  - Patient updated on findings as above and states understanding, discussed that follow-up imaging may be needed of the sacrum and coccyx if pain continues.  I offered CT imaging today however patient prefers to follow-up with her PCP, use donut pad and return for any new or worsening symptoms.  No indication for additional imaging at this time.  At this time there does not appear to be any evidence of an acute emergency medical condition and the patient appears stable for discharge with appropriate outpatient follow up. Diagnosis was discussed with patient who verbalizes understanding of care plan and is agreeable to discharge. I have discussed return precautions with patient who verbalizes understanding. Patient encouraged to follow-up with their PCP. All questions answered.  Patient seen and evaluated by Dr. Melina Copa during this visit who agrees with discharge with outpatient follow-up.  Note: Portions of this report may have been transcribed using voice recognition software. Every effort was made to ensure accuracy; however, inadvertent computerized transcription errors may still be present. Final Clinical Impression(s) / ED Diagnoses Final diagnoses:  Fall, initial encounter  Injury of head, initial encounter  Low back pain without sciatica, unspecified back pain laterality, unspecified chronicity    Rx / DC Orders ED Discharge Orders    None       Gari Crown 06/11/20 Edd Fabian, MD 06/12/20 1136

## 2020-06-11 NOTE — Discharge Instructions (Addendum)
At this time there does not appear to be the presence of an emergent medical condition, however there is always the potential for conditions to change. Please read and follow the below instructions.  Please return to the Emergency Department immediately for any new or worsening symptoms. Please be sure to follow up with your Primary Care Provider within one week regarding your visit today; please call their office to schedule an appointment even if you are feeling better for a follow-up visit. Your CT scan today showed atherosclerosis and other degenerative changes of your spine.  Please discuss this incidental findings with your primary care provider. The x-ray of your lumbar spine showed degenerative changes without fracture.  The x-ray of your coccyx also did not show any fracture however there may be an unseen fracture today and if your symptoms do not improve you may need follow-up imaging.  Please discuss all of your findings with your primary care provider at your follow-up visit. You received pain medication in the ER today which may make you drowsy.  Do not drive or perform any dance activities for the rest of the day.  Go to the nearest Emergency Department immediately if: You have fever or chills You have: A very bad headache that is not helped by medicine. Trouble walking or weakness in your arms and legs. Clear or bloody fluid coming from your nose or ears. Changes in how you see (vision). Shaking movements that you cannot control. You lose your balance. You vomit. The black centers of your eyes (pupils) change in size. Your speech is slurred. Your dizziness gets worse. You pass out. You are sleepier than normal and have trouble staying awake. You develop new bowel or bladder control problems. You have unusual weakness or numbness in your arms or legs. You develop nausea or vomiting. You develop abdominal pain. You feel faint. You have any new/concerning or worsening of  symptoms   Please read the additional information packets attached to your discharge summary.  Do not take your medicine if  develop an itchy rash, swelling in your mouth or lips, or difficulty breathing; call 911 and seek immediate emergency medical attention if this occurs.  You may review your lab tests and imaging results in their entirety on your MyChart account.  Please discuss all results of fully with your primary care provider and other specialist at your follow-up visit.  Note: Portions of this text may have been transcribed using voice recognition software. Every effort was made to ensure accuracy; however, inadvertent computerized transcription errors may still be present.

## 2020-06-16 NOTE — Progress Notes (Addendum)
[facility] [address], [place], [place] [address] [phone]  Date:  06/23/2020   Name:  Lisa Acosta   DOB:  [birth year]   MRN:  [id number]  PCP:  Darreld Mclean, MD    Chief Complaint: ER follow up (fall)   History of Present Illness:  Lisa Acosta is a 69 y.o. very pleasant female patient who presents with the following:  History of depression, atrial fibrillation, osteoarthritis and osteopenia, hypothyroidism, hyperlipidemia Following up from recent ER visit - seen in the ER on 10/1 after falling out a rolling chair.  This actually happened at a nail salon She hit her head and is on xarelto so she had a CT of her head/ cervical spine- negative  She was having headache the time of her accident but now ok She also landed on her behind on a hard floor- x-ray did not show any definite fracture in her sacrum/ coccyx This is getting somewhat better but is still sore, she is not able to ride her exercise bike or put direct pressure on the tailbone  Last seen by myself in January of this year - at that time she was struggling with frequent falls and gait instability.  She has been evaluated by neurology, Dr. Everette Rank with [facility] recent visit August 25 Reason for Visit  Tremor and memory issues. Assessment  1) Falls. Likely multifactorial. Suspect some possible musculoskeletal element where the right leg has become unreliable. Will attempt to obtain copy of nerve conduction study from outside provider. Recommended continued efforts at physical therapy as adaptive measure. We reviewed diagnostic considerations and uncertainties. 2) Memory impairment. Chronic. We reviewed diagnostic considerations, treatment concepts and options. Have recommended pursuing formal neuropsych testing to evaluate for any emerging neurodegenerative process such as Alzheimer's type dementia versus possible contributions from  mood. Question possible REM sleep behavior disorder which may increase risk for diffuse Lewy body dementia. Encouraged continued efforts at salubrious behaviors. 3) Tremor. 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 We reviewed pathophysiology, diagnostic considerations, treatment concepts and options as well as prognosis. May consider trials of medication, OT.  Next: PT, OT Plan  1) Neuropsych testing. 2) Recommended PT reattempt for balance and gait training. 3) Release of information from outside provider for nerve conduction study.  I plan to see Lisa Acosta back for Return in about 4 months (around 09/04/2020). She understands to call me for any questions or concerns.    covid vaccine- done including booster Flu vaccine- give today  mammo due this month- she will schedule this  Colon UTD BMP, lipids in August  She had a lumbar MRI in January of this year as follows IMPRESSION: Extruded disc fragment on the right at L4-5 with impingement right L4 nerve root. Mild degenerative change L3-4 and L5-S1.   She has seen neurosurgery and had a couple epidural steroid injections, unfortunately not very helpful She is doing neuropsychic testing in February of next year for memory loss  She has noted worsening of her urge urinary incontinence-she is having to wear pads She is up 2-3x a night to use the bathroom  Patient Active Problem List   Diagnosis Date Noted  . Memory difficulty 08/18/2019  . Right wrist pain 05/04/2019  . Mild episode of recurrent major depressive disorder ([place]) 12/11/2018  . Depression, major, single episode, mild ([place]) 10/22/2018  . Gait abnormality 10/14/2018  . 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  Moderate episode of recurrent major depressive disorder ([place]) 09/26/2018  . Generalized anxiety disorder 09/26/2018  . Osteopenia 01/25/2018  . 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 Dyspnea 09/22/2016  . OSA (obstructive sleep apnea)  08/31/2016  . PAF (paroxysmal atrial fibrillation) ([place]) 03/16/2016  . Hypothyroid 03/16/2016  . Obesity (BMI 30-39.9) 03/16/2016  . Essential hypertension 11/10/2015  . Hyperlipidemia 11/10/2015    Past Medical History:  Diagnosis Date  . Atrial fibrillation ([place])   . Depression   . Diverticulitis   . Diverticulosis   . Esophageal ulcer   . Gait abnormality 10/14/2018  . GERD (gastroesophageal reflux disease)   . Hyperlipidemia   . Hypertension   . Hypothyroid   . Memory difficulty 08/18/2019  . OSA (obstructive sleep apnea) 08/31/2016  . Osteopenia   . 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 BREAST BIOPSY Left    needle core biopsy, benign  . CATARACT EXTRACTION    . HAND SURGERY    . KNEE SURGERY    . ROTATOR CUFF REPAIR    . TOTAL ABDOMINAL HYSTERECTOMY      Social History   Tobacco Use  . Smoking status: Never Smoker  . Smokeless tobacco: Never Used  Vaping Use  . Vaping Use: Never used  Substance Use Topics  . Alcohol use: Yes    Comment: rare  . Drug use: No    Family History  Problem Relation Age of Onset  . Atrial fibrillation Mother   . Congestive Heart Failure Mother   . Depression Mother   . Heart disease Father   . Alcohol abuse Father   . Heart disease Brother   . Heart disease Brother   . Depression Sister   . Depression Daughter     Allergies  Allergen Reactions  . Dextran Anaphylaxis  . Dextrans   . Dextromethorphan Hbr Other (See Comments)    hallucinations  . Tree Extract   . Penicillin G Rash  . Penicillins Rash  . Sulfa Antibiotics Rash  . Sulfacetamide Sodium Rash    Medication list has been reviewed and updated.  Current Outpatient Medications on File Prior to Visit  Medication Sig Dispense Refill  . amLODipine (NORVASC) 5 MG tablet Take 1 tablet (5 mg total) by mouth daily. 90 tablet 3  . atenolol (TENORMIN) 50 MG tablet Take 1 tablet (50 mg total) by mouth  daily. 90 tablet 1  . BIOTIN PO Take 1 tablet by mouth daily.    . Calcium Carb-Cholecalciferol (CALCIUM 600 + D PO) Take by mouth.    . flecainide (TAMBOCOR) 100 MG tablet Take 1 tablet (100 mg total) by mouth 2 (two) times daily. 180 tablet 3  . Krill Oil 1000 MG CAPS Take 1 capsule by mouth daily.    [name] levothyroxine (SYNTHROID) 125 MCG tablet Take 1 tablet (125 mcg total) by mouth daily before breakfast. 90 tablet 3  . LUTEIN PO Take 1 capsule by mouth daily.    . magnesium 30 MG tablet Take 30 mg by mouth daily.    . methocarbamol (ROBAXIN) 500 MG tablet Take 1 tablet (500 mg total) by mouth every 8 (eight) hours as needed for muscle spasms. 40 tablet 0  . omeprazole (PRILOSEC) 40 MG capsule Take 1 capsule (40 mg total) by mouth 2 (two) times daily. 180 capsule 3  . Potassium 75 MG TABS Take 1 tablet by mouth daily.    . Turmeric 450 MG  CAPS Take 1 capsule by mouth daily.    [name] 20 MG TABS tablet TAKE 1 TABLET BY MOUTH DAILY WITH SUPPER 90 tablet 1  . FLUoxetine (PROZAC) 40 MG capsule Take 1 capsule (40 mg total) by mouth 2 (two) times daily. 180 capsule 3  . rosuvastatin (CRESTOR) 20 MG tablet Take 1 tablet (20 mg total) by mouth daily. 90 tablet 3  . traZODone (DESYREL) 50 MG tablet Take 1.5 tablets (75 mg total) by mouth at bedtime. 135 tablet 3   No current facility-administered medications on file prior to visit.    Review of Systems:  As per HPI- otherwise negative.   Physical Examination: Vitals:   06/23/20 1114  BP: 122/70  Pulse: (!) 51  Resp: 12  Temp: 98.1 F (36.7 C)  SpO2: 96%   Vitals:   06/23/20 1114  Weight: 203 lb 3.2 oz (92.2 kg)  Height: 5' 1.5" (1.562 m)   Body mass index is 37.77 kg/m. Ideal Body Weight: Weight in (lb) to have BMI = 25: 134.2  GEN: no acute distress. Obese, looks well  HEENT: Atraumatic, Normocephalic.  Ears and Nose: No external deformity. CV: RRR, No M/G/R. No JVD. No thrill. No extra heart sounds. PULM: CTA B, no  wheezes, crackles, rhonchi. No retractions. No resp. distress. No accessory muscle use. ABD: S, NT, ND, +BS. No rebound. No HSM. EXTR: No c/c/e PSYCH: Normally interactive. Conversant.  Tender over coccyx   Assessment and Plan: Hospital discharge follow-up  Memory difficulty  Paroxysmal atrial fibrillation ([place])  Hypothyroidism due to acquired atrophy of thyroid - Plan: TSH  Alkaline phosphatase elevation - Plan: Gamma GT, Hepatic function panel  Pure hypercholesterolemia - Plan: Lipid panel  Influenza vaccine administered - Plan: Flu Vaccine QUAD High Dose(Fluad)  Following up today from recent ER visit.  Patient was in a nail shop, she leaned forward in a rolling chair and fell forward.  She hit her head and her behind on the floor ER evaluation did not show any dangerous pathology. She continues to have tenderness over the coccyx.  She is content with allowing this to heal naturally  We will recheck TSH today Noted elevated alk phos previously, repeat alk phos and GGT Her cardiologist recently changed her cholesterol medication, check lipids today Flu shot given  Patient has noticed some issues with gait and balance, as well as her memory.  She is seeing neurology, has neuropsychiatric testing scheduled for early next year  This visit occurred during the SARS-CoV-2 public health emergency.  Safety protocols were in place, including screening questions prior to the visit, additional usage of staff PPE, and extensive cleaning of exam room while observing appropriate contact time as indicated for disinfecting solutions.   Lipids to [place]- recent change in lipid therapy to crestor  Signed Lamar Blinks, MD  Addendum 10/14, received her labs as below-message to patient Alk phos is now back to normal Results for orders placed or performed in visit on 06/23/20  TSH  Result Value Ref Range   TSH 1.00 0.40 - 4.50 mIU/L  Lipid panel  Result Value Ref Range   Cholesterol  190 <200 mg/dL   HDL 66 > OR = 50 mg/dL   Triglycerides 115 <150 mg/dL   LDL Cholesterol (Calc) 103 (H) mg/dL (calc)   Total CHOL/HDL Ratio 2.9 <5.0 (calc)   Non-HDL Cholesterol (Calc) 124 <130 mg/dL (calc)  Gamma GT  Result Value Ref Range   GGT 28 3 - 65 U/L  Hepatic  function panel  Result Value Ref Range   Total Protein 6.6 6.1 - 8.1 g/dL   Albumin 3.8 3.6 - 5.1 g/dL   Globulin 2.8 1.9 - 3.7 g/dL (calc)   AG Ratio 1.4 1.0 - 2.5 (calc)   Total Bilirubin 0.8 0.2 - 1.2 mg/dL   Bilirubin, Direct 0.1 0.0 - 0.2 mg/dL   Indirect Bilirubin 0.7 0.2 - 1.2 mg/dL (calc)   Alkaline phosphatase (APISO) 104 37 - 153 U/L   AST 32 10 - 35 U/L   ALT 32 (H) 6 - 29 U/L

## 2020-06-16 NOTE — Patient Instructions (Addendum)
It was good to see you again today! I will be in touch with your labs I can send your lipids to Dr Stanford Breed for you  Please let me know what the neuropsychology testing shows I expect your tailbone will heal over the next several weeks- please keep me posted   Take care!

## 2020-06-18 ENCOUNTER — Encounter: Payer: Self-pay | Admitting: Family Medicine

## 2020-06-18 DIAGNOSIS — R002 Palpitations: Secondary | ICD-10-CM

## 2020-06-18 MED ORDER — ATENOLOL 50 MG PO TABS: 50.0000 mg | ORAL_TABLET | Freq: Every day | ORAL | 1 refills | Status: DC

## 2020-06-23 ENCOUNTER — Ambulatory Visit (INDEPENDENT_AMBULATORY_CARE_PROVIDER_SITE_OTHER): Payer: Medicare Other | Admitting: Family Medicine

## 2020-06-23 ENCOUNTER — Other Ambulatory Visit: Payer: Self-pay

## 2020-06-23 ENCOUNTER — Encounter: Payer: Self-pay | Admitting: Family Medicine

## 2020-06-23 VITALS — BP 122/70 | HR 51 | Temp 98.1°F | Resp 12 | Ht 61.5 in | Wt 203.2 lb

## 2020-06-23 DIAGNOSIS — E7800 Pure hypercholesterolemia, unspecified: Secondary | ICD-10-CM | POA: Diagnosis not present

## 2020-06-23 DIAGNOSIS — Z23 Encounter for immunization: Secondary | ICD-10-CM | POA: Diagnosis not present

## 2020-06-23 DIAGNOSIS — I480 Paroxysmal atrial fibrillation: Secondary | ICD-10-CM | POA: Diagnosis not present

## 2020-06-23 DIAGNOSIS — R748 Abnormal levels of other serum enzymes: Secondary | ICD-10-CM | POA: Diagnosis not present

## 2020-06-23 DIAGNOSIS — E034 Atrophy of thyroid (acquired): Secondary | ICD-10-CM | POA: Diagnosis not present

## 2020-06-23 DIAGNOSIS — R413 Other amnesia: Secondary | ICD-10-CM

## 2020-06-23 DIAGNOSIS — Z09 Encounter for follow-up examination after completed treatment for conditions other than malignant neoplasm: Secondary | ICD-10-CM

## 2020-06-24 ENCOUNTER — Encounter: Payer: Self-pay | Admitting: Family Medicine

## 2020-06-24 LAB — TSH: TSH: 1 mIU/L (ref 0.40–4.50)

## 2020-06-24 LAB — HEPATIC FUNCTION PANEL
AG Ratio: 1.4 (calc) (ref 1.0–2.5)
ALT: 32 U/L — ABNORMAL HIGH (ref 6–29)
AST: 32 U/L (ref 10–35)
Albumin: 3.8 g/dL (ref 3.6–5.1)
Alkaline phosphatase (APISO): 104 U/L (ref 37–153)
Bilirubin, Direct: 0.1 mg/dL (ref 0.0–0.2)
Globulin: 2.8 g/dL (calc) (ref 1.9–3.7)
Indirect Bilirubin: 0.7 mg/dL (calc) (ref 0.2–1.2)
Total Bilirubin: 0.8 mg/dL (ref 0.2–1.2)
Total Protein: 6.6 g/dL (ref 6.1–8.1)

## 2020-06-24 LAB — GAMMA GT: GGT: 28 U/L (ref 3–65)

## 2020-06-24 LAB — LIPID PANEL
Cholesterol: 190 mg/dL (ref <200)
HDL: 66 mg/dL (ref >=50)
LDL Cholesterol (Calc): 103 mg/dL (calc) — ABNORMAL HIGH
Non-HDL Cholesterol (Calc): 124 mg/dL (calc) (ref <130)
Total CHOL/HDL Ratio: 2.9 (calc) (ref <5.0)
Triglycerides: 115 mg/dL (ref <150)

## 2020-07-21 ENCOUNTER — Encounter: Payer: Self-pay | Admitting: Family Medicine

## 2020-07-22 ENCOUNTER — Telehealth: Payer: Self-pay

## 2020-07-22 NOTE — Telephone Encounter (Signed)
I left a message asking the patient to call and schedule her yearly Medicare visit with nurse [facility] Coach).  If the patient calls back, please schedule Medicare Wellness Visit with Health Coach at next available opening, preferably in office but can be video or telephone. Last AWV 06/17/2019 VDM (DD)

## 2020-07-29 DIAGNOSIS — R419 Unspecified symptoms and signs involving cognitive functions and awareness: Secondary | ICD-10-CM | POA: Insufficient documentation

## 2020-07-31 ENCOUNTER — Other Ambulatory Visit: Payer: Self-pay | Admitting: Family Medicine

## 2020-07-31 DIAGNOSIS — F320 Major depressive disorder, single episode, mild: Secondary | ICD-10-CM

## 2020-08-03 ENCOUNTER — Encounter: Payer: Self-pay | Admitting: Family Medicine

## 2020-08-03 DIAGNOSIS — F320 Major depressive disorder, single episode, mild: Secondary | ICD-10-CM

## 2020-09-02 ENCOUNTER — Other Ambulatory Visit: Payer: Self-pay | Admitting: Family Medicine

## 2020-09-02 DIAGNOSIS — I480 Paroxysmal atrial fibrillation: Secondary | ICD-10-CM

## 2020-09-15 DIAGNOSIS — M545 Low back pain, unspecified: Secondary | ICD-10-CM | POA: Diagnosis not present

## 2020-09-15 DIAGNOSIS — Z7409 Other reduced mobility: Secondary | ICD-10-CM | POA: Diagnosis not present

## 2020-09-21 ENCOUNTER — Ambulatory Visit (INDEPENDENT_AMBULATORY_CARE_PROVIDER_SITE_OTHER): Payer: Medicare Other | Admitting: Medical

## 2020-09-21 ENCOUNTER — Other Ambulatory Visit: Payer: Self-pay

## 2020-09-21 ENCOUNTER — Telehealth: Payer: Self-pay | Admitting: Medical

## 2020-09-21 ENCOUNTER — Ambulatory Visit (HOSPITAL_BASED_OUTPATIENT_CLINIC_OR_DEPARTMENT_OTHER)
Admission: RE | Admit: 2020-09-21 | Discharge: 2020-09-21 | Disposition: A | Discharge status: Home / Self Care | Payer: Medicare Other | Source: Ambulatory Visit | Attending: Medical | Admitting: Medical

## 2020-09-21 VITALS — BP 139/69 | HR 53 | Resp 16 | Ht 61.0 in | Wt 205.0 lb

## 2020-09-21 DIAGNOSIS — R42 Dizziness and giddiness: Secondary | ICD-10-CM

## 2020-09-21 DIAGNOSIS — H814 Vertigo of central origin: Secondary | ICD-10-CM

## 2020-09-21 DIAGNOSIS — R519 Headache, unspecified: Secondary | ICD-10-CM | POA: Diagnosis not present

## 2020-09-21 DIAGNOSIS — H6121 Impacted cerumen, right ear: Secondary | ICD-10-CM | POA: Diagnosis not present

## 2020-09-21 MED ORDER — MECLIZINE HCL 12.5 MG PO TABS: 12.5000 mg | ORAL_TABLET | Freq: Three times a day (TID) | ORAL | 0 refills | Status: DC | PRN

## 2020-09-21 MED ORDER — FLUTICASONE PROPIONATE 50 MCG/ACT NA SUSP: 2.0000 | Freq: Every day | NASAL | 1 refills | Status: DC

## 2020-09-21 NOTE — Telephone Encounter (Signed)
Received teams msg from provider - no auth needed.

## 2020-09-21 NOTE — Telephone Encounter (Signed)
Can you get ct of head authorized. Does she need prior auth

## 2020-09-21 NOTE — Progress Notes (Signed)
Subjective:    Patient ID: Lisa Acosta, female    DOB: Jun 01, 1951, 70 y.o.   MRN: [id number]  HPI  Pt fell about one week ago.   Pt got out of bed and fell on her buttox. Shes craped her back on bed a week ago Monday. Pt went to PT past Wednesday.   Pt states Sunday had vertigo. She was dry heaving with nausea. Pt states vertigo better but still transient. Sunday when woke up and turned her head had spinning. No hx of vertigo until Sunday.   Pt had mild faint ha yesterday but not today.    No vomiting and not gross motor/sensory function deficits. But mild frontal ha and mild nausea.  Pt has hx of atrial fibrillation. She is on xarelto.   Pt has some bilateral mild ear itching.   BP is well controlled.       Review of Systems  Constitutional: Negative for chills, fatigue and fever.  HENT: Negative for congestion, sinus pressure and sinus pain.        Right ear cerumen  Respiratory: Negative for cough, chest tightness, shortness of breath and wheezing.   Cardiovascular: Negative for chest pain and palpitations.  Gastrointestinal: Positive for nausea. Negative for abdominal pain, anal bleeding and blood in stool.  Musculoskeletal: Negative for back pain, myalgias and neck stiffness.  Skin: Negative for rash.  Neurological: Positive for dizziness and headaches. Negative for facial asymmetry, speech difficulty, weakness and numbness.       Dizziness. With frontal ha mild.  Hematological: Negative for adenopathy. Does not bruise/bleed easily.  Psychiatric/Behavioral: Negative for behavioral problems, confusion and sleep disturbance. The patient is not nervous/anxious.     Past Medical History:  Diagnosis Date  . Atrial fibrillation ([place])   . Depression   . Diverticulitis   . Diverticulosis   . Esophageal ulcer   . Gait abnormality 10/14/2018  . GERD (gastroesophageal reflux disease)   . Hyperlipidemia   . Hypertension   . Hypothyroid   . Memory difficulty  08/18/2019  . OSA (obstructive sleep apnea) 08/31/2016  . Osteopenia   . 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 Marital status: Married    Spouse name: Jenny Reichmann  . Number of children: 1  . Years of education: College  . Highest education level: Not on file  Occupational History  . Occupation: Conservator, museum/gallery: OTHER    Comment: [employer]  Tobacco Use  . Smoking status: Never Smoker  . Smokeless tobacco: Never Used  Vaping Use  . Vaping Use: Never used  Substance and Sexual Activity  . Alcohol use: Yes    Comment: rare  . Drug use: No  . Sexual activity: Not on file  Other Topics Concern  . Not on file  Social History Narrative   Patient lives at home spouse.   Caffeine use: 2 sodas weekly   Right handed    Social Determinants of Health   Financial Resource Strain: Not on file  Food Insecurity: Not on file  Transportation Needs: Not on file  Physical Activity: Not on file  Stress: Not on file  Social Connections: Not on file  Intimate Partner Violence: Not on file    Past Surgical History:  Procedure Laterality Date  . BREAST BIOPSY Left    needle core biopsy, benign  . CATARACT EXTRACTION    . HAND SURGERY    .  KNEE SURGERY    . ROTATOR CUFF REPAIR    . TOTAL ABDOMINAL HYSTERECTOMY      Family History  Problem Relation Age of Onset  . Atrial fibrillation Mother   . Congestive Heart Failure Mother   . Depression Mother   . Heart disease Father   . Alcohol abuse Father   . Heart disease Brother   . Heart disease Brother   . Depression Sister   . Depression Daughter     Allergies  Allergen Reactions  . Dextran Anaphylaxis  . Dextrans   . Dextromethorphan Hbr Other (See Comments)    hallucinations  . Tree Extract   . Penicillin G Rash  . Penicillins Rash  . Sulfa Antibiotics Rash  . Sulfacetamide Sodium Rash    Current Outpatient Medications on File Prior to Visit   Medication Sig Dispense Refill  . amLODipine (NORVASC) 5 MG tablet TAKE 1 TABLET BY MOUTH EVERY DAY 90 tablet 1  . atenolol (TENORMIN) 50 MG tablet Take 1 tablet (50 mg total) by mouth daily. 90 tablet 1  . BIOTIN PO Take 1 tablet by mouth daily.    . Calcium Carb-Cholecalciferol (CALCIUM 600 + D PO) Take by mouth.    . flecainide (TAMBOCOR) 100 MG tablet Take 1 tablet (100 mg total) by mouth 2 (two) times daily. 180 tablet 3  . FLUoxetine (PROZAC) 40 MG capsule TAKE 1 CAPSULE BY MOUTH TWICE A DAY 180 capsule 2  . Krill Oil 1000 MG CAPS Take 1 capsule by mouth daily.    [name] levothyroxine (SYNTHROID) 125 MCG tablet Take 1 tablet (125 mcg total) by mouth daily before breakfast. 90 tablet 3  . LUTEIN PO Take 1 capsule by mouth daily.    . magnesium 30 MG tablet Take 30 mg by mouth daily.    . methocarbamol (ROBAXIN) 500 MG tablet Take 1 tablet (500 mg total) by mouth every 8 (eight) hours as needed for muscle spasms. 40 tablet 0  . omeprazole (PRILOSEC) 40 MG capsule Take 1 capsule (40 mg total) by mouth 2 (two) times daily. 180 capsule 3  . Potassium 75 MG TABS Take 1 tablet by mouth daily.    . Turmeric 450 MG CAPS Take 1 capsule by mouth daily.    [name] 20 MG TABS tablet TAKE 1 TABLET BY MOUTH DAILY WITH SUPPER 90 tablet 1  . rosuvastatin (CRESTOR) 20 MG tablet Take 1 tablet (20 mg total) by mouth daily. 90 tablet 3  . traZODone (DESYREL) 50 MG tablet Take 1.5 tablets (75 mg total) by mouth at bedtime. 135 tablet 3   No current facility-administered medications on file prior to visit.    BP 139/69   Pulse (!) 53   Resp 16   Ht 5\' 1"  (1.549 m)   Wt 205 lb (93 kg)   SpO2 95%   BMI 38.73 kg/m      Objective:   Physical Exam  General Mental Status- Alert. General Appearance- Not in acute distress.   Skin General: Color- Normal Color. Moisture- Normal Moisture.  Neck Carotid Arteries- Normal color. Moisture- Normal Moisture. No carotid bruits. No JVD.  Chest and Lung  Exam Auscultation: Breath Sounds:-Normal.  Cardiovascular Auscultation:Rythm- Regular. Murmurs & Other Heart Sounds:Auscultation of the heart reveals- No Murmurs.  Abdomen Inspection:-Inspeection Normal. Palpation/Percussion:Note:No mass. Palpation and Percussion of the abdomen reveal- Non Tender, Non Distended + BS, no rebound or guarding.   Neurologic Cranial Nerve exam:- CN III-XII intact(No nystagmus), symmetric smile. Strength:- 3/5  equal and symmetric strength both upper and lower extremities.(Note patient has moderate to severe degenerative changes to hand and states based on poor grip strength.) Finger-to-nose intact.  No hand drift.  Lying supine she has mild induced vertigo when head tilted to the right.    HEENT- left canal clear normal TM.  Right canal blocked with cerumen.  Could not visualize TM initially.  Post lavage wax cleared and TM is normal.    Assessment & Plan:  Recent onset of vertigo which is unusual for your history.  Also some nausea with a frontal region headache.  Headache level is low presently.  Overall good neurologic exam.  Recommend Tylenol for mild headache and Flonase nasal spray.  For residual of vertigo prescribed meclizine.  Rx advisement given.  Decided to go ahead and get a CT of head without contrast due to a combination of above symptoms.  Also history of hypertension and atrial fibrillation.  You can go down and get scheduled for CT of head without contrast today.  Staff notified me that you do not need prior authorization.  Right ear cerumen impaction.  Asking staff to wash out your ear.  Recent mild nausea.  Advised patient to use her Zofran which she has at the house.  She thinks it might be expired.  Though she is not sure and states medication is very expensive.  She is going to use Zofran and if it seems to not be working then advised to notify me and I will send a new prescription.  Follow-up in 7 to 10 days or as needed.  Mackie Pai, PA-C   Time spent with patient today was  40 minutes which consisted of chart review, discussing diagnosis, work up treatment and documentation.

## 2020-09-21 NOTE — Patient Instructions (Addendum)
Recent onset of vertigo which is unusual/never had before.  Also some nausea with a frontal region headache.  Headache level is low presently.  Overall good neurologic exam.  Recommend Tylenol for mild headache and Flonase nasal spray.  For residual of vertigo prescribed meclizine.  Rx advisement given.  Decided to go ahead and get a CT of head without contrast due to a combination of above symptoms.  Also history of hypertension and atrial fibrillation.  You can go down and get scheduled for CT of head without contrast today.  Staff notified me that you do not need prior authorization.  Right ear cerumen impaction.  Asking staff to wash out your ear.  Post lavage wax cleared completely.  Recent mild nausea.  Advised patient to use her Zofran which she has at the house.  She thinks it might be expired.  Though she is not sure and states medication is very expensive.  She is going to use Zofran and if it seems to not be working then advised to notify me and I will send a new prescription.  Follow-up in 7 to 10 days or as needed.

## 2020-09-23 ENCOUNTER — Encounter: Payer: Self-pay | Admitting: Medical

## 2020-09-23 ENCOUNTER — Telehealth: Payer: Self-pay | Admitting: Medical

## 2020-09-23 MED ORDER — AZITHROMYCIN 250 MG PO TABS: ORAL_TABLET | ORAL | 0 refills | Status: DC

## 2020-09-23 NOTE — Telephone Encounter (Signed)
Rx azithromycin sent to pt pharmacy. 

## 2020-10-04 DIAGNOSIS — M545 Low back pain, unspecified: Secondary | ICD-10-CM | POA: Diagnosis not present

## 2020-10-04 DIAGNOSIS — Z7409 Other reduced mobility: Secondary | ICD-10-CM | POA: Diagnosis not present

## 2020-10-06 DIAGNOSIS — F332 Major depressive disorder, recurrent severe without psychotic features: Secondary | ICD-10-CM | POA: Diagnosis not present

## 2020-10-07 DIAGNOSIS — M545 Low back pain, unspecified: Secondary | ICD-10-CM | POA: Diagnosis not present

## 2020-10-07 DIAGNOSIS — Z7409 Other reduced mobility: Secondary | ICD-10-CM | POA: Diagnosis not present

## 2020-10-07 DIAGNOSIS — H8111 Benign paroxysmal vertigo, right ear: Secondary | ICD-10-CM | POA: Diagnosis not present

## 2020-10-12 DIAGNOSIS — M545 Low back pain, unspecified: Secondary | ICD-10-CM | POA: Diagnosis not present

## 2020-10-12 DIAGNOSIS — Z7409 Other reduced mobility: Secondary | ICD-10-CM | POA: Diagnosis not present

## 2020-10-12 DIAGNOSIS — H8111 Benign paroxysmal vertigo, right ear: Secondary | ICD-10-CM | POA: Diagnosis not present

## 2020-10-13 DIAGNOSIS — F321 Major depressive disorder, single episode, moderate: Secondary | ICD-10-CM | POA: Diagnosis not present

## 2020-10-15 DIAGNOSIS — M545 Low back pain, unspecified: Secondary | ICD-10-CM | POA: Diagnosis not present

## 2020-10-15 DIAGNOSIS — Z7409 Other reduced mobility: Secondary | ICD-10-CM | POA: Diagnosis not present

## 2020-10-16 ENCOUNTER — Other Ambulatory Visit: Payer: Self-pay | Admitting: Medical

## 2020-10-19 DIAGNOSIS — M545 Low back pain, unspecified: Secondary | ICD-10-CM | POA: Diagnosis not present

## 2020-10-19 DIAGNOSIS — Z7409 Other reduced mobility: Secondary | ICD-10-CM | POA: Diagnosis not present

## 2020-10-19 DIAGNOSIS — H8111 Benign paroxysmal vertigo, right ear: Secondary | ICD-10-CM | POA: Diagnosis not present

## 2020-10-22 DIAGNOSIS — Z7409 Other reduced mobility: Secondary | ICD-10-CM | POA: Diagnosis not present

## 2020-10-22 DIAGNOSIS — M545 Low back pain, unspecified: Secondary | ICD-10-CM | POA: Diagnosis not present

## 2020-10-22 DIAGNOSIS — H8111 Benign paroxysmal vertigo, right ear: Secondary | ICD-10-CM | POA: Diagnosis not present

## 2020-10-26 DIAGNOSIS — Z5181 Encounter for therapeutic drug level monitoring: Secondary | ICD-10-CM | POA: Diagnosis not present

## 2020-10-26 DIAGNOSIS — I480 Paroxysmal atrial fibrillation: Secondary | ICD-10-CM | POA: Diagnosis not present

## 2020-10-26 DIAGNOSIS — Z79899 Other long term (current) drug therapy: Secondary | ICD-10-CM | POA: Diagnosis not present

## 2020-10-27 ENCOUNTER — Encounter: Payer: Self-pay | Admitting: Family Medicine

## 2020-10-27 ENCOUNTER — Other Ambulatory Visit: Payer: Self-pay | Admitting: Medical

## 2020-10-27 DIAGNOSIS — I480 Paroxysmal atrial fibrillation: Secondary | ICD-10-CM

## 2020-10-27 MED ORDER — FLECAINIDE ACETATE 100 MG PO TABS: 100.0000 mg | ORAL_TABLET | Freq: Two times a day (BID) | ORAL | 3 refills | Status: DC

## 2020-10-29 DIAGNOSIS — H8111 Benign paroxysmal vertigo, right ear: Secondary | ICD-10-CM | POA: Diagnosis not present

## 2020-10-29 DIAGNOSIS — M545 Low back pain, unspecified: Secondary | ICD-10-CM | POA: Diagnosis not present

## 2020-10-29 DIAGNOSIS — Z7409 Other reduced mobility: Secondary | ICD-10-CM | POA: Diagnosis not present

## 2020-10-30 NOTE — Progress Notes (Addendum)
[facility] [place], [place], [place] [address] [phone]  Date:  11/03/2020   Name:  Quincee Gittens   DOB:  May 12, 1951   MRN:  [id number]  PCP:  Darreld Mclean, MD    Chief Complaint: Medication Refill and Cough (Severe cough, started Friday, productive cough, slightly elevated temp, chest pain from coughing/)   History of Present Illness:  Maneh Sieben is a 70 y.o. very pleasant female patient who presents with the following:  Here today for a medication recheck History of depression, atrial fibrillation, osteoarthritis and osteopenia, hypothyroidism, hyperlipidemia Last seen by myself in October of last year She notes recent illness, became sick with a cough about 5 days ago Last seek she was coughing a lot- she is bringing up some mucus still The cough is not quite as severe as it was last week She ran a temp of 98 but no fever She took a home covid test on Monday and it was negative -today is Wednesday  Seen by cardiology last week - Dr Lurene Shadow at [facility]  They are monitoring her flecainide  All is ok- she was asked to stop her tumeric as she is on xarelto   She notes that she has been cold- she realized she was not taking her thyroid right, she was taking it with omeprazole.  She is now separating these medications  mammo 07/2019- will order for her  Dexa is now due- will order for her  covid series done shingrix   She is seeing PT- they are working on her back pain and her balance/ frequent falls  Lab Results  Component Value Date   TSH 1.00 06/23/2020     Patient Active Problem List   Diagnosis Date Noted  . Memory difficulty 08/18/2019  . Right wrist pain 05/04/2019  . Mild episode of recurrent major depressive disorder ([place]) 12/11/2018  . Depression, major, single episode, mild ([place]) 10/22/2018  . Gait abnormality 10/14/2018  . 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 Moderate episode of recurrent major depressive disorder ([place]) 09/26/2018  . Generalized anxiety disorder 09/26/2018  . Osteopenia 01/25/2018  . 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 Dyspnea 09/22/2016  . OSA (obstructive sleep apnea) 08/31/2016  . PAF (paroxysmal atrial fibrillation) ([place]) 03/16/2016  . Hypothyroid 03/16/2016  . Obesity (BMI 30-39.9) 03/16/2016  . Essential hypertension 11/10/2015  . Hyperlipidemia 11/10/2015    Past Medical History:  Diagnosis Date  . Atrial fibrillation ([place])   . Depression   . Diverticulitis   . Diverticulosis   . Esophageal ulcer   . Gait abnormality 10/14/2018  . GERD (gastroesophageal reflux disease)   . Hyperlipidemia   . Hypertension   . Hypothyroid   . Memory difficulty 08/18/2019  . OSA (obstructive sleep apnea) 08/31/2016  . Osteopenia   . 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 BREAST BIOPSY Left    needle core biopsy, benign  . CATARACT EXTRACTION    . HAND SURGERY    . KNEE SURGERY    . ROTATOR CUFF REPAIR    . TOTAL ABDOMINAL HYSTERECTOMY      Social History   Tobacco Use  . Smoking status: Never Smoker  . Smokeless tobacco: Never Used  Vaping Use  . Vaping Use: Never used  Substance Use Topics  . Alcohol use: Yes    Comment: rare  .  Drug use: No    Family History  Problem Relation Age of Onset  . Atrial fibrillation Mother   . Congestive Heart Failure Mother   . Depression Mother   . Heart disease Father   . Alcohol abuse Father   . Heart disease Brother   . Heart disease Brother   . Depression Sister   . Depression Daughter     Allergies  Allergen Reactions  . Dextran Anaphylaxis  . Dextrans   . Dextromethorphan Hbr Other (See Comments)    hallucinations  . Tree Extract   . Penicillin G Rash  . Penicillins Rash  . Sulfa Antibiotics Rash  . Sulfacetamide Sodium Rash    Medication list has been  reviewed and updated.  Current Outpatient Medications on File Prior to Visit  Medication Sig Dispense Refill  . amLODipine (NORVASC) 5 MG tablet TAKE 1 TABLET BY MOUTH EVERY DAY 90 tablet 1  . atenolol (TENORMIN) 50 MG tablet Take 1 tablet (50 mg total) by mouth daily. 90 tablet 1  . BIOTIN PO Take 1 tablet by mouth daily.    . Calcium Carb-Cholecalciferol (CALCIUM 600 + D PO) Take by mouth.    . flecainide (TAMBOCOR) 100 MG tablet Take 1 tablet (100 mg total) by mouth 2 (two) times daily. 180 tablet 3  . FLUoxetine (PROZAC) 40 MG capsule TAKE 1 CAPSULE BY MOUTH TWICE A DAY 180 capsule 2  . fluticasone (FLONASE) 50 MCG/ACT nasal spray SPRAY 2 SPRAYS INTO EACH NOSTRIL EVERY DAY 48 mL 1  . Krill Oil 1000 MG CAPS Take 1 capsule by mouth daily.    [name] levothyroxine (SYNTHROID) 125 MCG tablet Take 1 tablet (125 mcg total) by mouth daily before breakfast. 90 tablet 3  . LUTEIN PO Take 1 capsule by mouth daily.    . magnesium 30 MG tablet Take 30 mg by mouth daily.    . meclizine (ANTIVERT) 12.5 MG tablet Take 1 tablet (12.5 mg total) by mouth 3 (three) times daily as needed for dizziness. 30 tablet 0  . methocarbamol (ROBAXIN) 500 MG tablet Take 1 tablet (500 mg total) by mouth every 8 (eight) hours as needed for muscle spasms. 40 tablet 0  . omeprazole (PRILOSEC) 40 MG capsule Take 1 capsule (40 mg total) by mouth 2 (two) times daily. 180 capsule 3  . Potassium 75 MG TABS Take 1 tablet by mouth daily.    . Turmeric 450 MG CAPS Take 1 capsule by mouth daily.    [name] 20 MG TABS tablet TAKE 1 TABLET BY MOUTH DAILY WITH SUPPER 90 tablet 1  . rosuvastatin (CRESTOR) 20 MG tablet Take 1 tablet (20 mg total) by mouth daily. 90 tablet 3  . traZODone (DESYREL) 50 MG tablet Take 1.5 tablets (75 mg total) by mouth at bedtime. 135 tablet 3   No current facility-administered medications on file prior to visit.    Review of Systems:  As per HPI- otherwise negative.   Physical Examination: Vitals:    11/03/20 1054  BP: 122/70  Pulse: (!) 54  Resp: 18  Temp: 98.7 F (37.1 C)  SpO2: 97%   Vitals:   11/03/20 1054  Weight: 204 lb (92.5 kg)  Height: 5\' 1"  (1.549 m)   Body mass index is 38.55 kg/m. Ideal Body Weight: Weight in (lb) to have BMI = 25: 132  GEN: no acute distress.  Obese, looks well Occasional cough which can be productive HEENT: Atraumatic, Normocephalic.  Ears and Nose: No external deformity.  CV: RRR, No M/G/R. No JVD. No thrill. No extra heart sounds. PULM: CTA B, no wheezes, crackles, rhonchi. No retractions. No resp. distress. No accessory muscle use. EXTR: No c/c/e PSYCH: Normally interactive. Conversant.    Assessment and Plan: Screening mammogram for breast cancer - Plan: MM 3D SCREEN BREAST BILATERAL  Estrogen deficiency - Plan: DG Bone Density  Paroxysmal atrial fibrillation ([place]) - Plan: CBC  Hypothyroidism due to acquired atrophy of thyroid - Plan: TSH  Obesity (BMI 30-39.9) - Plan: Amb Ref to Medical Weight Management  Cough - Plan: DG Chest 2 View  Here today for a follow-up visit.  Ordered mammogram and bone density Due for thyroid level Tylin is interested in a weight loss program, will refer to the weight and wellness center with [facility] health. She notes recent productive cough.  COVID-19 test is negative.  Will obtain chest film today and plan to treat for bronchitis/pneumonia as noted This visit occurred during the SARS-CoV-2 public health emergency.  Safety protocols were in place, including screening questions prior to the visit, additional usage of staff PPE, and extensive cleaning of exam room while observing appropriate contact time as indicated for disinfecting solutions.    Signed Lamar Blinks, MD  Received her chest film below, message to patient  DG Chest 2 View  Result Date: 11/03/2020 CLINICAL DATA:  Cough. EXAM: CHEST - 2 VIEW COMPARISON:  PA and lateral chest 09/24/2019. FINDINGS: Lungs are clear. Heart size is normal.  Aortic atherosclerosis. No pneumothorax or pleural fluid. No acute or focal bony abnormality. IMPRESSION: No acute disease. Aortic Atherosclerosis (ICD10-I70.0). Electronically Signed   By: Inge Rise M.D.   On: 11/03/2020 12:04   Received her labs as below, message to patient Results for orders placed or performed in visit on 11/03/20  CBC  Result Value Ref Range   WBC 6.2 4.0 - 10.5 K/uL   RBC 4.77 3.87 - 5.11 Mil/uL   Platelets 275.0 150.0 - 400.0 K/uL   Hemoglobin 13.7 12.0 - 15.0 g/dL   HCT 41.4 36.0 - 46.0 %   MCV 86.7 78.0 - 100.0 fl   MCHC 33.1 30.0 - 36.0 g/dL   RDW 13.7 11.5 - 15.5 %  TSH  Result Value Ref Range   TSH 2.10 0.35 - 4.50 uIU/mL

## 2020-10-30 NOTE — Patient Instructions (Addendum)
Good to see you again today! I will be in touch with your labs and your chest film asap I think we will need an antibiotic but I wanted to check on your firm before we decide which one  We will refer you to the weight and wellness center, and also to get your mammogram and bone density screening    Health Maintenance After Age 71 After age 6, you are at a higher risk for certain long-term diseases and infections as well as injuries from falls. Falls are a major cause of broken bones and head injuries in people who are older than age 3. Getting regular preventive care can help to keep you healthy and well. Preventive care includes getting regular testing and making lifestyle changes as recommended by your health care provider. Talk with your health care provider about:  Which screenings and tests you should have. A screening is a test that checks for a disease when you have no symptoms.  A diet and exercise plan that is right for you. What should I know about screenings and tests to prevent falls? Screening and testing are the best ways to find a health problem early. Early diagnosis and treatment give you the best chance of managing medical conditions that are common after age 36. Certain conditions and lifestyle choices may make you more likely to have a fall. Your health care provider may recommend:  Regular vision checks. Poor vision and conditions such as cataracts can make you more likely to have a fall. If you wear glasses, make sure to get your prescription updated if your vision changes.  Medicine review. Work with your health care provider to regularly review all of the medicines you are taking, including over-the-counter medicines. Ask your health care provider about any side effects that may make you more likely to have a fall. Tell your health care provider if any medicines that you take make you feel dizzy or sleepy.  Osteoporosis screening. Osteoporosis is a condition that causes  the bones to get weaker. This can make the bones weak and cause them to break more easily.  Blood pressure screening. Blood pressure changes and medicines to control blood pressure can make you feel dizzy.  Strength and balance checks. Your health care provider may recommend certain tests to check your strength and balance while standing, walking, or changing positions.  Foot health exam. Foot pain and numbness, as well as not wearing proper footwear, can make you more likely to have a fall.  Depression screening. You may be more likely to have a fall if you have a fear of falling, feel emotionally low, or feel unable to do activities that you used to do.  Alcohol use screening. Using too much alcohol can affect your balance and may make you more likely to have a fall. What actions can I take to lower my risk of falls? General instructions  Talk with your health care provider about your risks for falling. Tell your health care provider if: ? You fall. Be sure to tell your health care provider about all falls, even ones that seem minor. ? You feel dizzy, sleepy, or off-balance.  Take over-the-counter and prescription medicines only as told by your health care provider. These include any supplements.  Eat a healthy diet and maintain a healthy weight. A healthy diet includes low-fat dairy products, low-fat (lean) meats, and fiber from whole grains, beans, and lots of fruits and vegetables. Home safety  Remove any tripping hazards, such as  rugs, cords, and clutter.  Install safety equipment such as grab bars in bathrooms and safety rails on stairs.  Keep rooms and walkways well-lit. Activity  Follow a regular exercise program to stay fit. This will help you maintain your balance. Ask your health care provider what types of exercise are appropriate for you.  If you need a cane or walker, use it as recommended by your health care provider.  Wear supportive shoes that have nonskid soles.    Lifestyle  Do not drink alcohol if your health care provider tells you not to drink.  If you drink alcohol, limit how much you have: ? 0-1 drink a day for women. ? 0-2 drinks a day for men.  Be aware of how much alcohol is in your drink. In the [place] one drink equals one typical bottle of beer (12 oz), one-half glass of wine (5 oz), or one shot of hard liquor (1 oz).  Do not use any products that contain nicotine or tobacco, such as cigarettes and e-cigarettes. If you need help quitting, ask your health care provider. Summary  Having a healthy lifestyle and getting preventive care can help to protect your health and wellness after age 78.  Screening and testing are the best way to find a health problem early and help you avoid having a fall. Early diagnosis and treatment give you the best chance for managing medical conditions that are more common for people who are older than age 43.  Falls are a major cause of broken bones and head injuries in people who are older than age 40. Take precautions to prevent a fall at home.  Work with your health care provider to learn what changes you can make to improve your health and wellness and to prevent falls. This information is not intended to replace advice given to you by your health care provider. Make sure you discuss any questions you have with your health care provider. Document Revised: 12/19/2018 Document Reviewed: 07/11/2017 Elsevier Patient Education  2021 [employer].

## 2020-11-03 ENCOUNTER — Other Ambulatory Visit: Payer: Self-pay

## 2020-11-03 ENCOUNTER — Encounter: Payer: Self-pay | Admitting: Family Medicine

## 2020-11-03 ENCOUNTER — Ambulatory Visit (INDEPENDENT_AMBULATORY_CARE_PROVIDER_SITE_OTHER): Payer: Medicare Other | Admitting: Family Medicine

## 2020-11-03 ENCOUNTER — Ambulatory Visit (HOSPITAL_BASED_OUTPATIENT_CLINIC_OR_DEPARTMENT_OTHER)
Admission: RE | Admit: 2020-11-03 | Discharge: 2020-11-03 | Disposition: A | Discharge status: Home / Self Care | Payer: Medicare Other | Source: Ambulatory Visit | Attending: Family Medicine | Admitting: Family Medicine

## 2020-11-03 VITALS — BP 122/70 | HR 54 | Temp 98.7°F | Resp 18 | Ht 61.0 in | Wt 204.0 lb

## 2020-11-03 DIAGNOSIS — E034 Atrophy of thyroid (acquired): Secondary | ICD-10-CM

## 2020-11-03 DIAGNOSIS — R059 Cough, unspecified: Secondary | ICD-10-CM | POA: Insufficient documentation

## 2020-11-03 DIAGNOSIS — Z1231 Encounter for screening mammogram for malignant neoplasm of breast: Secondary | ICD-10-CM

## 2020-11-03 DIAGNOSIS — E669 Obesity, unspecified: Secondary | ICD-10-CM | POA: Diagnosis not present

## 2020-11-03 DIAGNOSIS — E2839 Other primary ovarian failure: Secondary | ICD-10-CM

## 2020-11-03 DIAGNOSIS — I480 Paroxysmal atrial fibrillation: Secondary | ICD-10-CM | POA: Diagnosis not present

## 2020-11-03 LAB — CBC
HCT: 41.4 % (ref 36.0–46.0)
Hemoglobin: 13.7 g/dL (ref 12.0–15.0)
MCHC: 33.1 g/dL (ref 30.0–36.0)
MCV: 86.7 fl (ref 78.0–100.0)
Platelets: 275 10*3/uL (ref 150.0–400.0)
RBC: 4.77 Mil/uL (ref 3.87–5.11)
RDW: 13.7 % (ref 11.5–15.5)
WBC: 6.2 10*3/uL (ref 4.0–10.5)

## 2020-11-03 LAB — TSH: TSH: 2.1 u[IU]/mL (ref 0.35–4.50)

## 2020-11-03 MED ORDER — DOXYCYCLINE HYCLATE 100 MG PO CAPS: 100.0000 mg | ORAL_CAPSULE | Freq: Two times a day (BID) | ORAL | 0 refills | Status: DC

## 2020-11-09 ENCOUNTER — Encounter: Payer: Self-pay | Admitting: Family Medicine

## 2020-11-11 ENCOUNTER — Encounter: Payer: Self-pay | Admitting: Family Medicine

## 2020-11-11 MED ORDER — BENZONATATE 200 MG PO CAPS: 200.0000 mg | ORAL_CAPSULE | Freq: Three times a day (TID) | ORAL | 2 refills | Status: DC | PRN

## 2020-11-12 MED ORDER — PREDNISONE 20 MG PO TABS: ORAL_TABLET | ORAL | 0 refills | Status: DC

## 2020-11-12 NOTE — Addendum Note (Signed)
Addended by: Lamar Blinks C on: 11/12/2020 12:42 PM   Modules accepted: Orders

## 2020-11-14 DIAGNOSIS — Z20822 Contact with and (suspected) exposure to covid-19: Secondary | ICD-10-CM | POA: Diagnosis not present

## 2020-11-14 DIAGNOSIS — I10 Essential (primary) hypertension: Secondary | ICD-10-CM | POA: Diagnosis not present

## 2020-11-14 DIAGNOSIS — J209 Acute bronchitis, unspecified: Secondary | ICD-10-CM | POA: Diagnosis not present

## 2020-11-14 DIAGNOSIS — K219 Gastro-esophageal reflux disease without esophagitis: Secondary | ICD-10-CM | POA: Diagnosis not present

## 2020-11-14 DIAGNOSIS — E039 Hypothyroidism, unspecified: Secondary | ICD-10-CM | POA: Diagnosis not present

## 2020-11-15 MED ORDER — PREDNISONE 20 MG PO TABS
ORAL_TABLET | ORAL | 0 refills | Status: DC
Start: 2020-11-15 — End: 2020-12-22

## 2020-11-15 NOTE — Addendum Note (Signed)
Addended by: Lamar Blinks C on: 11/15/2020 04:48 PM   Modules accepted: Orders

## 2020-11-15 NOTE — Telephone Encounter (Signed)
Caller: Zain  Call Back @ [phone]   Patient states she would like a refill on her predniSONE (DELTASONE) 20 MG tablet, patient states this is the first day she has felt better and only has one pill left, patient would like a few more pills sent to [facility]. Patient leaves in the morning to go out of town.

## 2020-12-06 ENCOUNTER — Encounter: Payer: Self-pay | Admitting: Family Medicine

## 2020-12-06 DIAGNOSIS — I480 Paroxysmal atrial fibrillation: Secondary | ICD-10-CM

## 2020-12-06 MED ORDER — RIVAROXABAN 20 MG PO TABS: 20.0000 mg | ORAL_TABLET | Freq: Every day | ORAL | 3 refills | Status: DC

## 2020-12-14 ENCOUNTER — Telehealth: Payer: Self-pay | Admitting: Family Medicine

## 2020-12-14 DIAGNOSIS — M545 Low back pain, unspecified: Secondary | ICD-10-CM | POA: Diagnosis not present

## 2020-12-14 DIAGNOSIS — Z7409 Other reduced mobility: Secondary | ICD-10-CM | POA: Diagnosis not present

## 2020-12-14 NOTE — Telephone Encounter (Signed)
Nurse Assessment Nurse: Eugenio Hoes, RN, Jenny Reichmann Date/Time (Eastern Time): 12/14/2020 2:14:54 PM Confirm and document reason for call. If symptomatic, describe symptoms. ---Caller states that she was tested for covid and it was negative and was told she had bronchitis and is done taking her antibiotics and prednisone. she is currently having cough, chills and unable to check her temperature. Does the patient have any new or worsening symptoms? ---Yes Will a triage be completed? ---Yes Related visit to physician within the last 2 weeks? ---Yes Does the PT have any chronic conditions? (i.e. diabetes, asthma, this includes High risk factors for pregnancy, etc.) ---Yes List chronic conditions. ---HTN, a-fib, HLD, depression Is this a behavioral health or substance abuse call? ---No Guidelines Guideline Title Affirmed Question Affirmed Notes Nurse Date/Time (Eastern Time) Cough - Chronic [1] Continuous (nonstop) coughing interferes with work or school AND [2] no improvement using cough treatment per Care Advice Eugenio Hoes, RN, Jenny Reichmann 12/14/2020 2:17:41 PM PLEASE NOTE: All timestamps contained within this report are represented as [place] Standard Time. CONFIDENTIALTY NOTICE: This fax transmission is intended only for the addressee. It contains information that is legally privileged, confidential or otherwise protected from use or disclosure. If you are not the intended recipient, you are strictly prohibited from reviewing, disclosing, copying using or disseminating any of this information or taking any action in reliance on or regarding this information. If you have received this fax in error, please notify us immediately by telephone so that we can arrange for its return to [place]. Phone: [phone], Toll-Free: [phone], Fax: [phone] Page: 2 of 2 Call Id: [id number] [place]. Time Eilene Ghazi Time) Disposition Final User 12/14/2020 2:24:56 PM See PCP within 24 Hours Yes Eugenio Hoes, RN, Jenny Reichmann Caller  Disagree/Comply Comply Caller Understands Yes PreDisposition Call Doctor Care Advice Given Per Guideline SEE PCP WITHIN 24 HOURS: * IF OFFICE WILL BE OPEN: You need to be examined within the next 24 hours. Call your doctor (or NP/PA) when the office opens and make an appointment. COUGHING SPELLS: * Drink warm fluids. Inhale warm mist. This can help relax the airway and also loosen up phlegm. * Suck on cough drops or hard candy to coat the irritated throat. * COUGH SYRUP WITH DEXTROMETHORPHAN: An over-the-counter cough syrup can help your cough. The most common cough suppressant in over-the-counter cough medicines is dextromethorphan. COUGH SYRUP WITH DEXTROMETHORPHAN: * Cough syrups containing the cough suppressant dextromethorphan may help decrease your cough. * Cough syrup works best for coughs that keep you awake at night. It can also sometimes help in the late stages of a lung or airway infection when the cough is dry and hacking. Cough syrup can be used along with cough drops. * Examples: Delsym 12-hour Cough, Robitussin Cough LongActing, Triaminic Long-Acting, Vicks DayQuil Cough. COUGH SYRUP WITH DEXTROMETHORPHAN - EXTRA NOTES AND WARNINGS: * Do not try to completely stop coughs that produce mucus and phlegm. * Coughing is helpful. It brings up the mucus from the lungs and helps prevent pneumonia. COUGH MEDICINES: * COUGH DROPS: Over-the-counter cough drops can help a lot, especially for mild coughs. They soothe an irritated throat and remove the tickle sensation in the back of the throat. Cough drops are easy to carry with you. * HOME REMEDY - HARD CANDY: Hard candy works just as well as over-the-counter cough drops. People who have diabetes should use sugar-free candy. * HOME REMEDY - HONEY: This old home remedy has been shown to help decrease coughing at night. The adult dosage is 2 teaspoons (10 ml) at bedtime.  HUMIDIFIER: * If the air is dry, use a humidifier in the bedroom. * Dry air  makes coughs worse. CALL BACK IF: * Difficulty breathing occurs * You become worse CARE ADVICE given per Cough - Chronic (Adult) guideline. * IF OFFICE WILL BE CLOSED: You need to be seen within the next 24 hours. A clinic or an urgent care center is often a good source of care if your doctor's office is closed or you can't get an appointment. Referrals REFERRED TO PCP OFFICE

## 2020-12-14 NOTE — Telephone Encounter (Signed)
Spoke w/ Pt- she is currently waiting for her husband to get home to take her to urgent care. I offered her a virtual visit but declines because her phone frequently drops telephone calls. Informed her I'd document that she is waiting on her husband then will be going to urgent care.

## 2020-12-14 NOTE — Telephone Encounter (Signed)
Patient called stating chest pain, coughing and chills , patient sent to triage

## 2020-12-15 DIAGNOSIS — R509 Fever, unspecified: Secondary | ICD-10-CM | POA: Diagnosis not present

## 2020-12-15 DIAGNOSIS — I700 Atherosclerosis of aorta: Secondary | ICD-10-CM | POA: Diagnosis not present

## 2020-12-15 DIAGNOSIS — Z20822 Contact with and (suspected) exposure to covid-19: Secondary | ICD-10-CM | POA: Diagnosis not present

## 2020-12-15 DIAGNOSIS — R059 Cough, unspecified: Secondary | ICD-10-CM | POA: Diagnosis not present

## 2020-12-15 DIAGNOSIS — R110 Nausea: Secondary | ICD-10-CM | POA: Diagnosis not present

## 2020-12-15 DIAGNOSIS — J069 Acute upper respiratory infection, unspecified: Secondary | ICD-10-CM | POA: Diagnosis not present

## 2020-12-18 ENCOUNTER — Other Ambulatory Visit: Payer: Self-pay | Admitting: Family Medicine

## 2020-12-18 DIAGNOSIS — I480 Paroxysmal atrial fibrillation: Secondary | ICD-10-CM

## 2020-12-20 ENCOUNTER — Encounter: Payer: Self-pay | Admitting: Family Medicine

## 2020-12-20 DIAGNOSIS — E7800 Pure hypercholesterolemia, unspecified: Secondary | ICD-10-CM

## 2020-12-20 MED ORDER — ROSUVASTATIN CALCIUM 20 MG PO TABS: 20.0000 mg | ORAL_TABLET | Freq: Every day | ORAL | 3 refills | Status: DC

## 2020-12-21 DIAGNOSIS — M545 Low back pain, unspecified: Secondary | ICD-10-CM | POA: Diagnosis not present

## 2020-12-21 DIAGNOSIS — R2689 Other abnormalities of gait and mobility: Secondary | ICD-10-CM | POA: Diagnosis not present

## 2020-12-21 DIAGNOSIS — R413 Other amnesia: Secondary | ICD-10-CM | POA: Diagnosis not present

## 2020-12-21 DIAGNOSIS — F32A Depression, unspecified: Secondary | ICD-10-CM | POA: Diagnosis not present

## 2020-12-22 ENCOUNTER — Other Ambulatory Visit: Payer: Self-pay

## 2020-12-22 ENCOUNTER — Ambulatory Visit (INDEPENDENT_AMBULATORY_CARE_PROVIDER_SITE_OTHER): Payer: Medicare Other | Admitting: Internal Medicine

## 2020-12-22 ENCOUNTER — Encounter: Payer: Self-pay | Admitting: Internal Medicine

## 2020-12-22 VITALS — BP 122/78 | HR 59 | Temp 98.3°F | Resp 18 | Ht 61.0 in | Wt 202.0 lb

## 2020-12-22 DIAGNOSIS — R059 Cough, unspecified: Secondary | ICD-10-CM | POA: Diagnosis not present

## 2020-12-22 MED ORDER — HYDROCODONE-HOMATROPINE 5-1.5 MG/5ML PO SYRP: 5.0000 mL | ORAL_SOLUTION | Freq: Two times a day (BID) | ORAL | 0 refills | Status: DC | PRN

## 2020-12-22 MED ORDER — AZITHROMYCIN 250 MG PO TABS: ORAL_TABLET | ORAL | 0 refills | Status: DC

## 2020-12-22 MED ORDER — PREDNISONE 10 MG PO TABS: ORAL_TABLET | ORAL | 0 refills | Status: DC

## 2020-12-22 NOTE — Patient Instructions (Addendum)
Start taking Zithromax, an antibiotic  Start on low dose prednisone  Mucinex or Mucinex DM as needed for cough  Hydrocodone (syrup) as needed for cough twice a day.  Watch for drowsiness  Drink plenty fluids  Call if not gradually better.

## 2020-12-22 NOTE — Progress Notes (Signed)
Subjective:    Patient ID: Lisa Acosta, female    DOB: 1951/01/13, 70 y.o.   MRN: [id number]  DOS:  12/22/2020 Type of visit - description: Acute  Reports that in February she was diagnosed with bronchitis, chest x-ray was negative, was prescribed doxycycline, prednisone. It took few weeks before she felt better however never got 100% well.  Had a lingering cough.  She actually got sick again 10 days ago: Fever up to 101.0, chills, nausea, vomiting, headache. Went to urgent care 12/15/2020, repeated chest x-ray was done: No acute process.  A influenza and Covid test were negative, was Rx Tessalon but did not get it due to cost, was Rx prednisone but she thought it was too much and did not get it.  Has used a leftover albuterol with mixed results.   She ran out of albuterol despite.   Review of Systems Some sinus congestion Some wheezing, no hemoptysis.   Past Medical History:  Diagnosis Date  . Atrial fibrillation ([place])   . Depression   . Diverticulitis   . Diverticulosis   . Esophageal ulcer   . Gait abnormality 10/14/2018  . GERD (gastroesophageal reflux disease)   . Hyperlipidemia   . Hypertension   . Hypothyroid   . Memory difficulty 08/18/2019  . OSA (obstructive sleep apnea) 08/31/2016  . Osteopenia   . 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 BREAST BIOPSY Left    needle core biopsy, benign  . CATARACT EXTRACTION    . HAND SURGERY    . KNEE SURGERY    . ROTATOR CUFF REPAIR    . TOTAL ABDOMINAL HYSTERECTOMY      Allergies as of 12/22/2020      Reactions   Dextran Anaphylaxis   Dextrans    Tree Extract    Penicillin G Rash   Penicillins Rash   Sulfa Antibiotics Rash   Sulfacetamide Sodium Rash      Medication List       Accurate as of December 22, 2020 11:59 PM. If you have any questions, ask your nurse or doctor.        amLODipine 5 MG tablet Commonly known as: NORVASC TAKE 1 TABLET BY  MOUTH EVERY DAY   atenolol 50 MG tablet Commonly known as: TENORMIN Take 1 tablet (50 mg total) by mouth daily.   azithromycin 250 MG tablet Commonly known as: Zithromax Z-Pak 2 tabs a day the first day, then 1 tab a day x 4 days Started by: Kathlene November, MD   benzonatate 200 MG capsule Commonly known as: TESSALON Take 1 capsule (200 mg total) by mouth 3 (three) times daily as needed for cough.   BIOTIN PO Take 1 tablet by mouth daily.   CALCIUM 600 + D PO Take by mouth.   doxycycline 100 MG capsule Commonly known as: VIBRAMYCIN Take 1 capsule (100 mg total) by mouth 2 (two) times daily.   flecainide 100 MG tablet Commonly known as: TAMBOCOR Take 1 tablet (100 mg total) by mouth 2 (two) times daily.   FLUoxetine 40 MG capsule Commonly known as: PROZAC TAKE 1 CAPSULE BY MOUTH TWICE A DAY   fluticasone 50 MCG/ACT nasal spray Commonly known as: FLONASE SPRAY 2 SPRAYS INTO EACH NOSTRIL EVERY DAY   HYDROcodone-homatropine 5-1.5 MG/5ML syrup Commonly known as: HYCODAN Take 5 mLs by mouth 2 (two) times daily as needed for cough. Started by: Kathlene November, MD   Astrid Drafts  1000 MG Caps Take 1 capsule by mouth daily.   levothyroxine 125 MCG tablet Commonly known as: SYNTHROID Take 1 tablet (125 mcg total) by mouth daily before breakfast.   LUTEIN PO Take 1 capsule by mouth daily.   magnesium 30 MG tablet Take 30 mg by mouth daily.   meclizine 12.5 MG tablet Commonly known as: ANTIVERT Take 1 tablet (12.5 mg total) by mouth 3 (three) times daily as needed for dizziness.   methocarbamol 500 MG tablet Commonly known as: ROBAXIN Take 1 tablet (500 mg total) by mouth every 8 (eight) hours as needed for muscle spasms.   omeprazole 40 MG capsule Commonly known as: PRILOSEC Take 1 capsule (40 mg total) by mouth 2 (two) times daily.   Potassium 75 MG Tabs Take 1 tablet by mouth daily.   predniSONE 10 MG tablet Commonly known as: DELTASONE 3 tabs x 3 days, 2 tabs x 3 days, 1  tab x 3 days What changed:   medication strength  additional instructions Changed by: Kathlene November, MD   rivaroxaban 20 MG Tabs tablet Commonly known as: Xarelto Take 1 tablet (20 mg total) by mouth daily with supper.   rosuvastatin 20 MG tablet Commonly known as: CRESTOR Take 1 tablet (20 mg total) by mouth daily.   traZODone 50 MG tablet Commonly known as: DESYREL Take 1.5 tablets (75 mg total) by mouth at bedtime.   Turmeric 450 MG Caps Take 1 capsule by mouth daily.          Objective:   Physical Exam BP 122/78 (BP Location: Left Arm, Patient Position: Sitting, Cuff Size: Normal)   Pulse (!) 59   Temp 98.3 F (36.8 C) (Oral)   Resp 18   Ht 5\' 1"  (1.549 m)   Wt 202 lb (91.6 kg)   SpO2 97%   BMI 38.17 kg/m  General:   Well developed, NAD, BMI noted. HEENT:  Normocephalic . Face symmetric, atraumatic Lungs:  CTA B, few rhonchi, no crackles.  Minimal if any end expiratory wheezing.  Frequent cough noted. Normal respiratory effort, no intercostal retractions, no accessory muscle use. Heart: RRR,  no murmur.  Lower extremities: no pretibial edema bilaterally  Skin: Not pale. Not jaundice Neurologic:  alert & oriented X3.  Speech normal, gait appropriate for age and unassisted Psych--  Cognition and judgment appear intact.  Cooperative with normal attention span and concentration.  Behavior appropriate. No anxious or depressed appearing.      Assessment     70 year old female, PMH includes allergies to penicillin, sulfa, dextromethorphan, also HTN, A. fib, sleep apnea, thyroid disease, DJD, anticoagulated with Xarelto, never smoker, presents with  Cough: This is actually going on for few weeks, had doxycycline with the onset of symptoms, cough resurface few days ago associated with fever. Went to urgent care, chest x-ray and Covid test negative.  She does not look toxic. Atypical infection? Plan: Mucinex or Mucinex DM Zithromax Low-dose  prednisone Hydrocodone for symptom management Albuterol did not seem to help much but we could try again in few days if she is no better.     This visit occurred during the SARS-CoV-2 public health emergency.  Safety protocols were in place, including screening questions prior to the visit, additional usage of staff PPE, and extensive cleaning of exam room while observing appropriate contact time as indicated for disinfecting solutions.

## 2020-12-27 ENCOUNTER — Other Ambulatory Visit: Payer: Self-pay | Admitting: Family Medicine

## 2020-12-27 ENCOUNTER — Other Ambulatory Visit: Payer: Self-pay

## 2020-12-27 ENCOUNTER — Ambulatory Visit (HOSPITAL_BASED_OUTPATIENT_CLINIC_OR_DEPARTMENT_OTHER)
Admission: RE | Admit: 2020-12-27 | Discharge: 2020-12-27 | Disposition: A | Discharge status: Home / Self Care | Payer: Medicare Other | Source: Ambulatory Visit | Attending: Family Medicine | Admitting: Family Medicine

## 2020-12-27 ENCOUNTER — Encounter (HOSPITAL_BASED_OUTPATIENT_CLINIC_OR_DEPARTMENT_OTHER): Payer: Self-pay

## 2020-12-27 ENCOUNTER — Encounter: Payer: Self-pay | Admitting: Family Medicine

## 2020-12-27 DIAGNOSIS — Z1231 Encounter for screening mammogram for malignant neoplasm of breast: Secondary | ICD-10-CM

## 2020-12-27 DIAGNOSIS — E2839 Other primary ovarian failure: Secondary | ICD-10-CM | POA: Diagnosis not present

## 2020-12-27 DIAGNOSIS — Z780 Asymptomatic menopausal state: Secondary | ICD-10-CM | POA: Diagnosis not present

## 2020-12-27 DIAGNOSIS — M810 Age-related osteoporosis without current pathological fracture: Secondary | ICD-10-CM | POA: Diagnosis not present

## 2020-12-29 DIAGNOSIS — Z7409 Other reduced mobility: Secondary | ICD-10-CM | POA: Diagnosis not present

## 2020-12-29 DIAGNOSIS — M545 Low back pain, unspecified: Secondary | ICD-10-CM | POA: Diagnosis not present

## 2020-12-30 ENCOUNTER — Other Ambulatory Visit: Payer: Self-pay | Admitting: Family Medicine

## 2020-12-30 DIAGNOSIS — R002 Palpitations: Secondary | ICD-10-CM

## 2021-01-03 DIAGNOSIS — M545 Low back pain, unspecified: Secondary | ICD-10-CM | POA: Diagnosis not present

## 2021-01-03 DIAGNOSIS — Z7409 Other reduced mobility: Secondary | ICD-10-CM | POA: Diagnosis not present

## 2021-01-05 ENCOUNTER — Ambulatory Visit (INDEPENDENT_AMBULATORY_CARE_PROVIDER_SITE_OTHER): Payer: Medicare Other | Admitting: Bariatrics

## 2021-01-05 ENCOUNTER — Encounter (INDEPENDENT_AMBULATORY_CARE_PROVIDER_SITE_OTHER): Payer: Self-pay | Admitting: Bariatrics

## 2021-01-05 ENCOUNTER — Other Ambulatory Visit: Payer: Self-pay

## 2021-01-05 VITALS — BP 135/82 | HR 59 | Temp 98.5°F | Ht 61.0 in | Wt 189.0 lb

## 2021-01-05 DIAGNOSIS — Z6837 Body mass index (BMI) 37.0-37.9, adult: Secondary | ICD-10-CM

## 2021-01-05 DIAGNOSIS — Z1331 Encounter for screening for depression: Secondary | ICD-10-CM | POA: Diagnosis not present

## 2021-01-05 DIAGNOSIS — R7303 Prediabetes: Secondary | ICD-10-CM | POA: Diagnosis not present

## 2021-01-05 DIAGNOSIS — G4733 Obstructive sleep apnea (adult) (pediatric): Secondary | ICD-10-CM

## 2021-01-05 DIAGNOSIS — E559 Vitamin D deficiency, unspecified: Secondary | ICD-10-CM

## 2021-01-05 DIAGNOSIS — E7849 Other hyperlipidemia: Secondary | ICD-10-CM

## 2021-01-05 DIAGNOSIS — Z0289 Encounter for other administrative examinations: Secondary | ICD-10-CM

## 2021-01-05 DIAGNOSIS — R5383 Other fatigue: Secondary | ICD-10-CM

## 2021-01-05 DIAGNOSIS — K219 Gastro-esophageal reflux disease without esophagitis: Secondary | ICD-10-CM | POA: Diagnosis not present

## 2021-01-05 DIAGNOSIS — E038 Other specified hypothyroidism: Secondary | ICD-10-CM

## 2021-01-05 DIAGNOSIS — R0602 Shortness of breath: Secondary | ICD-10-CM

## 2021-01-05 DIAGNOSIS — I10 Essential (primary) hypertension: Secondary | ICD-10-CM

## 2021-01-06 DIAGNOSIS — Z7409 Other reduced mobility: Secondary | ICD-10-CM | POA: Diagnosis not present

## 2021-01-06 DIAGNOSIS — M545 Low back pain, unspecified: Secondary | ICD-10-CM | POA: Diagnosis not present

## 2021-01-06 LAB — HEMOGLOBIN A1C
Est. average glucose Bld gHb Est-mCnc: 131 mg/dL
Hgb A1c MFr Bld: 6.2 % — ABNORMAL HIGH (ref 4.8–5.6)

## 2021-01-06 LAB — LIPID PANEL WITH LDL/HDL RATIO
Cholesterol, Total: 186 mg/dL (ref 100–199)
HDL: 67 mg/dL (ref >39)
LDL Chol Calc (NIH): 95 mg/dL (ref 0–99)
LDL/HDL Ratio: 1.4 ratio (ref 0.0–3.2)
Triglycerides: 138 mg/dL (ref 0–149)
VLDL Cholesterol Cal: 24 mg/dL (ref 5–40)

## 2021-01-06 LAB — INSULIN, RANDOM: INSULIN: 20.4 u[IU]/mL (ref 2.6–24.9)

## 2021-01-06 LAB — VITAMIN D 25 HYDROXY (VIT D DEFICIENCY, FRACTURES): Vit D, 25-Hydroxy: 25 ng/mL — ABNORMAL LOW (ref 30.0–100.0)

## 2021-01-06 NOTE — Progress Notes (Signed)
Dear Dr. Lorelei Pont,   Thank you for referring Lisa Acosta to our clinic. The following note includes my evaluation and treatment recommendations.  Chief Complaint:   OBESITY Lisa Acosta (MR# [id number]) is a 70 y.o. female who presents for evaluation and treatment of obesity and related comorbidities. Current BMI is Body mass index is 35.71 kg/m. Lisa Acosta has been struggling with her weight for many years and has been unsuccessful in either losing weight, maintaining weight loss, or reaching her healthy weight goal.  Lisa Acosta is currently in the action stage of change and ready to dedicate time achieving and maintaining a healthier weight. Lisa Acosta is interested in becoming our patient and working on intensive lifestyle modifications including (but not limited to) diet and exercise for weight loss.  Lisa Acosta does not like to cook. She craves carbohydrates. She considers herself a picky eater.  Lisa Acosta's habits were reviewed today and are as follows: she started gaining weight after marriage, her heaviest weight ever was 236 pounds, she is a picky eater and doesn't like to eat healthier foods, she has significant food cravings issues, she snacks frequently in the evenings, she skips meals frequently, she is frequently drinking liquids with calories, she frequently makes poor food choices and she struggles with emotional eating.  Depression Screen Lisa Acosta's Food and Mood (modified PHQ-9) score was 8.  Depression screen PHQ 2/9 01/05/2021  Decreased Interest 1  Down, Depressed, Hopeless 2  PHQ - 2 Score 3  Altered sleeping 0  Tired, decreased energy 1  Change in appetite 2  Feeling bad or failure about yourself  1  Trouble concentrating 1  Moving slowly or fidgety/restless 0  Suicidal thoughts 0  PHQ-9 Score 8  Difficult doing work/chores Not difficult at all   Subjective:   1. Other fatigue Lisa Acosta admits to daytime somnolence and admits to waking up still tired. Patent has a  history of symptoms of daytime fatigue and morning fatigue. Lisa Acosta generally gets 7 or 8 hours of sleep per night, and states that she has generally restful sleep with trazodone. Snoring is present. Apneic episodes (didn't answer). Epworth Sleepiness Score is 8.  2. SOB (shortness of breath) on exertion Briget notes increasing shortness of breath with exercising and seems to be worsening over time with weight gain. She notes getting out of breath sooner with activity than she used to. This has not gotten worse recently. Ophia denies shortness of breath at rest or orthopnea.  3. Essential hypertension Zalayah is taking amlodipine and atenolol. Her BP is reasonably well controlled.  4. Other specified hypothyroidism Controlled. Pammy is taking levothyroxine.  5. Gastroesophageal reflux disease, unspecified whether esophagitis present Sola is taking omeprazole.   6. OSA (obstructive sleep apnea) Maansi wears a CPAP machine.  7. Pre-diabetes Jaelynn denies polyphagia or decreased appetite.  8. Vitamin D deficiency Mikyla sits outside.  9. Other hyperlipidemia Averi is on Crestor.  Assessment/Plan:   1. Other fatigue Lisa Acosta does feel that her weight is causing her energy to be lower than it should be. Fatigue may be related to obesity, depression or many other causes. Labs will be ordered, and in the meanwhile, Lisa Acosta will focus on self care including making healthy food choices, increasing physical activity and focusing on stress reduction. Check labs today.  - EKG 12-Lead - Hemoglobin A1c - Insulin, random - Lipid Panel With LDL/HDL Ratio - VITAMIN D 25 Hydroxy (Vit-D Deficiency, Fractures)  2. SOB (shortness of breath) on exertion Lisa Acosta does not feel  that she gets out of breath more easily that she used to when she exercises. Lisa Acosta's shortness of breath appears to be obesity related and exercise induced. She has agreed to work on weight loss and gradually increase exercise to treat her  exercise induced shortness of breath. Will continue to monitor closely. Check labs today.  - Lipid Panel With LDL/HDL Ratio  3. Essential hypertension Lisa Acosta is working on healthy weight loss and exercise to improve blood pressure control. We will watch for signs of hypotension as she continues her lifestyle modifications. Continue current treatment plan.  4. Other specified hypothyroidism Patient with long-standing hypothyroidism, on levothyroxine therapy. She appears euthyroid. Orders and follow up as documented in patient record. Continue current treatment plan.  Counseling . Good thyroid control is important for overall health. Supratherapeutic thyroid levels are dangerous and will not improve weight loss results. . The correct way to take levothyroxine is fasting, with water, separated by at least 30 minutes from breakfast, and separated by more than 4 hours from calcium, iron, multivitamins, acid reflux medications (PPIs).   5. Gastroesophageal reflux disease, unspecified whether esophagitis present Intensive lifestyle modifications are the first line treatment for this issue. We discussed several lifestyle modifications today and she will continue to work on diet, exercise and weight loss efforts. Orders and follow up as documented in patient record.   Counseling . If a person has gastroesophageal reflux disease (GERD), food and stomach acid move back up into the esophagus and cause symptoms or problems such as damage to the esophagus. . Anti-reflux measures include: raising the head of the bed, avoiding tight clothing or belts, avoiding eating late at night, not lying down shortly after mealtime, and achieving weight loss. . Avoid ASA, NSAID's, caffeine, alcohol, and tobacco.  . OTC Pepcid and/or Tums are often very helpful for as needed use.  Lisa Acosta Kitchen However, for persisting chronic or daily symptoms, stronger medications like Omeprazole may be needed. . You may need to avoid foods and drinks  such as: ? Coffee and tea (with or without caffeine). ? Drinks that contain alcohol. ? Energy drinks and sports drinks. ? Bubbly (carbonated) drinks or sodas. ? Chocolate and cocoa. ? Peppermint and mint flavorings. ? Garlic and onions. ? Horseradish. ? Spicy and acidic foods. These include peppers, chili powder, curry powder, vinegar, hot sauces, and BBQ sauce. ? Citrus fruit juices and citrus fruits, such as oranges, lemons, and limes. ? Tomato-based foods. These include red sauce, chili, salsa, and pizza with red sauce. ? Fried and fatty foods. These include donuts, french fries, potato chips, and high-fat dressings. ? High-fat meats. These include hot dogs, rib eye steak, sausage, ham, and bacon.  6. OSA (obstructive sleep apnea) Intensive lifestyle modifications are the first line treatment for this issue. We discussed several lifestyle modifications today and she will continue to work on diet, exercise and weight loss efforts. We will continue to monitor. Orders and follow up as documented in patient record. Will continue to wear CPAP.  7. Pre-diabetes Karna will continue to work on weight loss, exercise, and decreasing simple carbohydrates to help decrease the risk of diabetes.  Check labs today.  - Hemoglobin A1c - Insulin, random  8. Vitamin D deficiency Low Vitamin D level contributes to fatigue and are associated with obesity, breast, and colon cancer. She agrees to follow-up for routine testing of Vitamin D, at least 2-3 times per year to avoid over-replacement. Check labs today.  - VITAMIN D 25 Hydroxy (Vit-D Deficiency, Fractures)  9. Other hyperlipidemia Cardiovascular risk and specific lipid/LDL goals reviewed.  We discussed several lifestyle modifications today and Ernie will continue to work on diet, exercise and weight loss efforts. Orders and follow up as documented in patient record. Check labs today. Continue Crestor.  Counseling Intensive lifestyle  modifications are the first line treatment for this issue. . Dietary changes: Increase soluble fiber. Decrease simple carbohydrates. . Exercise changes: Moderate to vigorous-intensity aerobic activity 150 minutes per week if tolerated. . Lipid-lowering medications: see documented in medical record.  - Lipid Panel With LDL/HDL Ratio  10. Depression screen Le had a positive depression screening. Depression is commonly associated with obesity and often results in emotional eating behaviors. We will monitor this closely and work on CBT to help improve the non-hunger eating patterns. Referral to Psychology may be required if no improvement is seen as she continues in our clinic.  11. Class 2 severe obesity with serious comorbidity and body mass index (BMI) of 37.0 to 37.9 in adult, unspecified obesity type [facility])  Savayah is currently in the action stage of change and her goal is to continue with weight loss efforts. I recommend Kerrilyn begin the structured treatment plan as follows:  She has agreed to the Category 1 Plan.  Meal plan Intentional eating 11/03/2020 labs reviewed (CBC, TSH) Will not skip meals.  Exercise goals: No exercise has been prescribed at this time.   Behavioral modification strategies: increasing lean protein intake, decreasing simple carbohydrates, increasing vegetables, increasing water intake, decreasing eating out, no skipping meals, meal planning and cooking strategies, keeping healthy foods in the home and planning for success.  She was informed of the importance of frequent follow-up visits to maximize her success with intensive lifestyle modifications for her multiple health conditions. She was informed we would discuss her lab results at her next visit unless there is a critical issue that needs to be addressed sooner. Nazarene agreed to keep her next visit at the agreed upon time to discuss these results.  Objective:   Blood pressure 135/82, pulse (!) 59, temperature  98.5 F (36.9 C), height 5\' 1"  (1.549 m), weight 189 lb (85.7 kg), SpO2 96 %. Body mass index is 35.71 kg/m.  EKG: Abnormal, rate 51, bradycardia- first degree A-V Block PRi= 238 -Nonspecific T-abnormality  Indirect Calorimeter completed today shows a VO2 of 178 and a REE of 1238.  Her calculated basal metabolic rate is 7253 thus her basal metabolic rate is worse than expected.  General: Cooperative, alert, well developed, in no acute distress. Uses a cane for ambulation. HEENT: Conjunctivae and lids unremarkable. Cardiovascular: Regular rhythm.  Lungs: Normal work of breathing. Neurologic: No focal deficits.   Lab Results  Component Value Date   CREATININE 0.61 09/24/2019   BUN 19 09/24/2019   NA 139 09/24/2019   K 3.8 09/24/2019   CL 105 09/24/2019   CO2 27 09/24/2019   Lab Results  Component Value Date   ALT 32 (H) 06/23/2020   AST 32 06/23/2020   ALKPHOS 132 (H) 04/30/2020   BILITOT 0.8 06/23/2020   Lab Results  Component Value Date   HGBA1C 6.2 (H) 01/05/2021   HGBA1C 5.9 06/23/2019   HGBA1C 5.8 04/15/2018   HGBA1C 5.8 08/13/2017   HGBA1C 6.0 01/22/2017   Lab Results  Component Value Date   INSULIN 20.4 01/05/2021   Lab Results  Component Value Date   TSH 2.10 11/03/2020   Lab Results  Component Value Date   CHOL 186 01/05/2021  HDL 67 01/05/2021   LDLCALC 95 01/05/2021   LDLDIRECT 112.0 01/22/2017   TRIG 138 01/05/2021   CHOLHDL 2.9 06/23/2020   Lab Results  Component Value Date   WBC 6.2 11/03/2020   HGB 13.7 11/03/2020   HCT 41.4 11/03/2020   MCV 86.7 11/03/2020   PLT 275.0 11/03/2020   No results found for: IRON, TIBC, FERRITIN Obesity Behavioral Intervention:   Approximately 15 minutes were spent on the discussion below.  ASK: We discussed the diagnosis of obesity with Arryana today and Amiayah agreed to give [place] permission to discuss obesity behavioral modification therapy today.  ASSESS: Corine has the diagnosis of obesity and her BMI  today is 37.5. Lisa Acosta is in the action stage of change.   ADVISE: Enslee was educated on the multiple health risks of obesity as well as the benefit of weight loss to improve her health. She was advised of the need for long term treatment and the importance of lifestyle modifications to improve her current health and to decrease her risk of future health problems.  AGREE: Multiple dietary modification options and treatment options were discussed and Tasnia agreed to follow the recommendations documented in the above note.  ARRANGE: Allysia was educated on the importance of frequent visits to treat obesity as outlined per CMS and USPSTF guidelines and agreed to schedule her next follow up appointment today.  Attestation Statements:   Reviewed by clinician on day of visit: allergies, medications, problem list, medical history, surgical history, family history, social history, and previous encounter notes.  Coral Ceo, am acting as Location manager for [employer], DO.  I have reviewed the above documentation for accuracy and completeness, and I agree with the above. -Jearld Lesch, DO

## 2021-01-07 ENCOUNTER — Ambulatory Visit: Payer: Medicare Other | Admitting: Cardiology

## 2021-01-10 ENCOUNTER — Encounter (INDEPENDENT_AMBULATORY_CARE_PROVIDER_SITE_OTHER): Payer: Self-pay | Admitting: Bariatrics

## 2021-01-10 DIAGNOSIS — F331 Major depressive disorder, recurrent, moderate: Secondary | ICD-10-CM | POA: Diagnosis not present

## 2021-01-10 DIAGNOSIS — E559 Vitamin D deficiency, unspecified: Secondary | ICD-10-CM | POA: Insufficient documentation

## 2021-01-10 DIAGNOSIS — F419 Anxiety disorder, unspecified: Secondary | ICD-10-CM | POA: Diagnosis not present

## 2021-01-11 DIAGNOSIS — R2689 Other abnormalities of gait and mobility: Secondary | ICD-10-CM | POA: Diagnosis not present

## 2021-01-11 DIAGNOSIS — M546 Pain in thoracic spine: Secondary | ICD-10-CM | POA: Diagnosis not present

## 2021-01-12 DIAGNOSIS — Z7409 Other reduced mobility: Secondary | ICD-10-CM | POA: Diagnosis not present

## 2021-01-12 DIAGNOSIS — M545 Low back pain, unspecified: Secondary | ICD-10-CM | POA: Diagnosis not present

## 2021-01-14 DIAGNOSIS — M545 Low back pain, unspecified: Secondary | ICD-10-CM | POA: Diagnosis not present

## 2021-01-14 DIAGNOSIS — Z7409 Other reduced mobility: Secondary | ICD-10-CM | POA: Diagnosis not present

## 2021-01-17 DIAGNOSIS — Z7409 Other reduced mobility: Secondary | ICD-10-CM | POA: Diagnosis not present

## 2021-01-17 DIAGNOSIS — M545 Low back pain, unspecified: Secondary | ICD-10-CM | POA: Diagnosis not present

## 2021-01-17 NOTE — Progress Notes (Signed)
HPI: FU atrial fibrillation. Monitor March 2017 showed sinus rhythm with paroxysmal atrial fibrillation.  Patient treated with flecainide.  Exercise treadmill October 2018 showed no exercise-induced ventricular tachycardia; no ST changes noted.  Echocardiogram at [facility] 2019 showed normal LV function, severe left atrial enlargement, mild RV dysfunction, mild mitral regurgitation.  Abdominal ultrasound November 2020 showed no aneurysm.  Since last seen, she denies dyspnea, chest pain, palpitations or syncope.  No bleeding.  Current Outpatient Medications  Medication Sig Dispense Refill  . alendronate (FOSAMAX) 70 MG tablet Take 1 tablet (70 mg total) by mouth every 7 (seven) days. Take with a full glass of water on an empty stomach. 12 tablet 3  . amLODipine (NORVASC) 5 MG tablet TAKE 1 TABLET BY MOUTH EVERY DAY 90 tablet 1  . atenolol (TENORMIN) 50 MG tablet TAKE 1 TABLET BY MOUTH EVERY DAY 90 tablet 1  . flecainide (TAMBOCOR) 100 MG tablet Take 1 tablet (100 mg total) by mouth 2 (two) times daily. 180 tablet 3  . FLUoxetine (PROZAC) 40 MG capsule TAKE 1 CAPSULE BY MOUTH TWICE A DAY 180 capsule 2  . HYDROcodone-acetaminophen (NORCO/VICODIN) 5-325 MG tablet Take 1 tablet by mouth every 8 (eight) hours as needed for up to 5 days. 30 tablet 0  . levothyroxine (SYNTHROID) 125 MCG tablet Take 1 tablet (125 mcg total) by mouth daily before breakfast. 90 tablet 3  . omeprazole (PRILOSEC) 40 MG capsule Take 40 mg by mouth in the morning and at bedtime.    . rivaroxaban (XARELTO) 20 MG TABS tablet Take 20 mg by mouth daily with supper.    . rosuvastatin (CRESTOR) 20 MG tablet Take 1 tablet (20 mg total) by mouth daily. 90 tablet 3  . traZODone (DESYREL) 50 MG tablet Take 1.5 tablets (75 mg total) by mouth at bedtime. 135 tablet 3  . Vitamin D, Ergocalciferol, (DRISDOL) 1.25 MG (50000 UNIT) CAPS capsule Take 1 capsule (50,000 Units total) by mouth every 7 (seven) days. 4 capsule 0   No  current facility-administered medications for this visit.     Past Medical History:  Diagnosis Date  . Atrial fibrillation ([place])   . Back pain   . Constipation   . Depression   . Diverticulitis   . Diverticulitis   . Diverticulosis   . Esophageal ulcer   . Fatty liver   . Gait abnormality 10/14/2018  . GERD (gastroesophageal reflux disease)   . Hyperlipidemia   . Hypertension   . Hypothyroid   . Memory difficulty 08/18/2019  . OSA (obstructive sleep apnea) 08/31/2016  . Osteopenia   . 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 Pre-diabetes     Past Surgical History:  Procedure Laterality Date  . BREAST BIOPSY Left    needle core biopsy, benign  . CATARACT EXTRACTION    . HAND SURGERY    . KNEE SURGERY    . ROTATOR CUFF REPAIR    . TOTAL ABDOMINAL HYSTERECTOMY      Social History   Socioeconomic History  . Marital status: Married    Spouse name: Jenny Reichmann  . Number of children: 1  . Years of education: College  . Highest education level: Not on file  Occupational History  . Occupation: Retired    Fish farm manager: OTHER    Comment: Radiographer, therapeutic  Tobacco Use  . Smoking status: Never Smoker  . Smokeless tobacco: Never Used  Vaping Use  . Vaping Use: Never used  Substance and Sexual  Activity  . Alcohol use: Yes    Comment: rare  . Drug use: No  . Sexual activity: Not on file  Other Topics Concern  . Not on file  Social History Narrative   Patient lives at home spouse.   Caffeine use: 2 sodas weekly   Right handed    Social Determinants of Health   Financial Resource Strain: Not on file  Food Insecurity: Not on file  Transportation Needs: Not on file  Physical Activity: Not on file  Stress: Not on file  Social Connections: Not on file  Intimate Partner Violence: Not on file    Family History  Problem Relation Age of Onset  . Atrial fibrillation Mother   . Congestive Heart Failure Mother   . Depression Mother   . Heart disease Mother   . Stroke  Mother   . Thyroid disease Mother   . Anxiety disorder Mother   . Obesity Mother   . Heart disease Father   . Alcohol abuse Father   . High blood pressure Father   . High Cholesterol Father   . Alcoholism Father   . Heart disease Brother   . Heart disease Brother   . Depression Sister   . Depression Daughter     ROS: Depression but no fevers or chills, productive cough, hemoptysis, dysphasia, odynophagia, melena, hematochezia, dysuria, hematuria, rash, seizure activity, orthopnea, PND, pedal edema, claudication. Remaining systems are negative.  Physical Exam: Well-developed obese in no acute distress.  Skin is warm and dry.  HEENT is normal.  Neck is supple.  Chest is clear to auscultation with normal expansion.  Cardiovascular exam is regular rate and rhythm.  Abdominal exam nontender or distended. No masses palpated. Extremities show no edema. neuro grossly intact  A/P  1 paroxysmal atrial fibrillation-by history patient has had no recurrences.  Continue flecainide and atenolol.  Continue Xarelto.  We will have most recent laboratories including BUN/creatinine forwarded to [place].  2 hypertension-patient's blood pressure is controlled.  Continue present medications.  3 hyperlipidemia-continue Crestor.  Kirk Ruths, MD

## 2021-01-19 ENCOUNTER — Other Ambulatory Visit: Payer: Self-pay

## 2021-01-19 ENCOUNTER — Encounter (INDEPENDENT_AMBULATORY_CARE_PROVIDER_SITE_OTHER): Payer: Self-pay | Admitting: Bariatrics

## 2021-01-19 ENCOUNTER — Ambulatory Visit (INDEPENDENT_AMBULATORY_CARE_PROVIDER_SITE_OTHER): Payer: Medicare Other | Admitting: Bariatrics

## 2021-01-19 VITALS — BP 117/73 | HR 56 | Temp 98.7°F | Ht 61.0 in | Wt 191.0 lb

## 2021-01-19 DIAGNOSIS — R7303 Prediabetes: Secondary | ICD-10-CM | POA: Diagnosis not present

## 2021-01-19 DIAGNOSIS — E559 Vitamin D deficiency, unspecified: Secondary | ICD-10-CM

## 2021-01-19 DIAGNOSIS — Z6837 Body mass index (BMI) 37.0-37.9, adult: Secondary | ICD-10-CM | POA: Diagnosis not present

## 2021-01-19 MED ORDER — VITAMIN D (ERGOCALCIFEROL) 1.25 MG (50000 UNIT) PO CAPS: 50000.0000 [IU] | ORAL_CAPSULE | ORAL | 0 refills | Status: DC

## 2021-01-20 NOTE — Progress Notes (Signed)
Chief Complaint:   OBESITY Lisa Acosta is here to discuss her progress with her obesity treatment plan along with follow-up of her obesity related diagnoses. Lisa Acosta is on the Category 1 Plan and states she is following her eating plan approximately 97% of the time. Lisa Acosta states she is doing 0 minutes 0 times per week.  Today's visit was #: 2 Starting weight: 198 lbs Starting date: 01/05/2021 Today's weight: 191 lbs Today's date: 01/19/2021 Total lbs lost to date: 7 Total lbs lost since last in-office visit: 7  Interim History: Lisa Acosta is down 7 lbs since her last visit. She states that she "stuck" with the diet well. She did not get hungry between meals.  Subjective:   1. Vitamin D deficiency Lisa Acosta is taking calcium plus Vit D.  2. Pre-diabetes Lisa Acosta A1c is 6.2, and she is not on medications.  Assessment/Plan:   1. Vitamin D deficiency Low Vitamin D level contributes to fatigue and are associated with obesity, breast, and colon cancer. Lisa Acosta agreed to start prescription Vitamin D 50,000 IU every week with no refills. She will follow-up for routine testing of Vitamin D, at least 2-3 times per year to avoid over-replacement.  - Vitamin D, Ergocalciferol, (DRISDOL) 1.25 MG (50000 UNIT) CAPS capsule; Take 1 capsule (50,000 Units total) by mouth every 7 (seven) days.  Dispense: 4 capsule; Refill: 0  2. Pre-diabetes Lisa Acosta will continue to work on weight loss, increasing activities, increasing healthy fats and protein, and decreasing simple carbohydrates to help decrease the risk of diabetes.   3. Obesity, current BMI 86 Lisa Acosta is currently in the action stage of change. As such, her goal is to continue with weight loss efforts. She has agreed to the Category 1 Plan.   Intentional eating was discussed. I reviewed all labs from 01/05/2021 with the patient today. Eating Out handout was given today.  Exercise goals: She is going to physical therapy, and using bands for  exercise.  Behavioral modification strategies: increasing lean protein intake, decreasing simple carbohydrates, increasing vegetables, increasing water intake, decreasing eating out, no skipping meals, meal planning and cooking strategies, keeping healthy foods in the home, ways to avoid boredom eating and planning for success.  Lisa Acosta has agreed to follow-up with our clinic in 2 weeks. She was informed of the importance of frequent follow-up visits to maximize her success with intensive lifestyle modifications for her multiple health conditions.   Objective:   Blood pressure 117/73, pulse (!) 56, temperature 98.7 F (37.1 C), height 5\' 1"  (1.549 m), weight 191 lb (86.6 kg), SpO2 97 %. Body mass index is 36.09 kg/m.  Lisa Acosta is using a cane for ambulation. General: Cooperative, alert, well developed, in no acute distress. HEENT: Conjunctivae and lids unremarkable. Cardiovascular: Regular rhythm.  Lungs: Normal work of breathing. Neurologic: No focal deficits.   Lab Results  Component Value Date   CREATININE 0.61 09/24/2019   BUN 19 09/24/2019   NA 139 09/24/2019   K 3.8 09/24/2019   CL 105 09/24/2019   CO2 27 09/24/2019   Lab Results  Component Value Date   ALT 32 (H) 06/23/2020   AST 32 06/23/2020   ALKPHOS 132 (H) 04/30/2020   BILITOT 0.8 06/23/2020   Lab Results  Component Value Date   HGBA1C 6.2 (H) 01/05/2021   HGBA1C 5.9 06/23/2019   HGBA1C 5.8 04/15/2018   HGBA1C 5.8 08/13/2017   HGBA1C 6.0 01/22/2017   Lab Results  Component Value Date   INSULIN 20.4 01/05/2021  Lab Results  Component Value Date   TSH 2.10 11/03/2020   Lab Results  Component Value Date   CHOL 186 01/05/2021   HDL 67 01/05/2021   LDLCALC 95 01/05/2021   LDLDIRECT 112.0 01/22/2017   TRIG 138 01/05/2021   CHOLHDL 2.9 06/23/2020   Lab Results  Component Value Date   WBC 6.2 11/03/2020   HGB 13.7 11/03/2020   HCT 41.4 11/03/2020   MCV 86.7 11/03/2020   PLT 275.0 11/03/2020   No  results found for: IRON, TIBC, FERRITIN  Obesity Behavioral Intervention:   Approximately 15 minutes were spent on the discussion below.  ASK: We discussed the diagnosis of obesity with Lisa Acosta today and Lisa Acosta agreed to give [place] permission to discuss obesity behavioral modification therapy today.  ASSESS: Lisa Acosta has the diagnosis of obesity and her BMI today is 36.11. Lisa Acosta is in the action stage of change.   ADVISE: Lisa Acosta was educated on the multiple health risks of obesity as well as the benefit of weight loss to improve her health. She was advised of the need for long term treatment and the importance of lifestyle modifications to improve her current health and to decrease her risk of future health problems.  AGREE: Multiple dietary modification options and treatment options were discussed and Lisa Acosta agreed to follow the recommendations documented in the above note.  ARRANGE: Lisa Acosta was educated on the importance of frequent visits to treat obesity as outlined per CMS and USPSTF guidelines and agreed to schedule her next follow up appointment today.  Attestation Statements:   Reviewed by clinician on day of visit: allergies, medications, problem list, medical history, surgical history, family history, social history, and previous encounter notes.   Lisa Acosta, am acting as Location manager for [employer], DO.  I have reviewed the above documentation for accuracy and completeness, and I agree with the above. Lisa Lesch, DO

## 2021-01-20 NOTE — Progress Notes (Addendum)
[facility] [address], [place], [place] [address] [phone]  Date:  01/24/2021   Name:  Lisa Acosta   DOB:  1950/09/17   MRN:  [id number]  PCP:  Darreld Mclean, MD    Chief Complaint: Follow-up (Bone density results)   History of Present Illness:  Lisa Acosta is a 70 y.o. very pleasant female patient who presents with the following:  Following up today regarding bone density- History of depression, atrial fibrillation, osteoarthritis and osteopenia-however most recent bone density upgraded diagnosis to osteoporosis, hypothyroidism, hyperlipidemia Last visit with myself in February   She finally got over the bronchitis she had earlier this year.  She still has some lingering cough- OTC meds do help her   Bone density in April ASSESSMENT: The BMD measured at Femur Neck Left is 0.695 g/cm2 with a T-score of -2.5. This patient is considered osteoporotic according to [facility]) criteria. Compared with the prior study on, 01/24/2018 the BMD of the total mean shows a statistically significant decrease. The scan quality is good.  She uses hydrocodone on occasion for back pain; she filled 30 pills almost a year ago.  She wonders if she could have another prescription to use for the rare occasion when her pain is not controlled by over-the-counter medications  She sees Dr Kem Boroughs who recently found another compression in her T spine  Lab Results  Component Value Date   TSH 2.10 11/03/2020   Wt Readings from Last 3 Encounters:  01/24/21 195 lb 2 oz (88.5 kg)  01/19/21 191 lb (86.6 kg)  01/05/21 189 lb (85.7 kg)   She would like to get the shingles vaccine-plans to have that her drugstore She is aware of the 4th dose of covid and plans to do this soon   She is taking vitamin D OTC as well as calcium  Her mood today is improved from usual.  She visited her brothers in [place] not  long ago and this really boosted her.  She enjoy the sunshine and the warm weather  Patient Active Problem List   Diagnosis Date Noted  . Vitamin D insufficiency 01/10/2021  . Memory difficulty 08/18/2019  . Right wrist pain 05/04/2019  . Mild episode of recurrent major depressive disorder ([place]) 12/11/2018  . Depression, major, single episode, mild ([place]) 10/22/2018  . Gait abnormality 10/14/2018  . 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 Moderate episode of recurrent major depressive disorder ([place]) 09/26/2018  . Generalized anxiety disorder 09/26/2018  . Osteoporosis 01/25/2018  . 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 Dyspnea 09/22/2016  . OSA (obstructive sleep apnea) 08/31/2016  . PAF (paroxysmal atrial fibrillation) ([place]) 03/16/2016  . Hypothyroid 03/16/2016  . Obesity (BMI 30-39.9) 03/16/2016  . Essential hypertension 11/10/2015  . Hyperlipidemia 11/10/2015    Past Medical History:  Diagnosis Date  . Atrial fibrillation ([place])   . Back pain   . Constipation   . Depression   . Diverticulitis   . Diverticulitis   . Diverticulosis   . Esophageal ulcer   . Fatty liver   . Gait abnormality 10/14/2018  . GERD (gastroesophageal reflux disease)   . Hyperlipidemia   . Hypertension   . Hypothyroid   . Memory difficulty 08/18/2019  . OSA (obstructive sleep apnea) 08/31/2016  . Osteopenia   . 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 Pre-diabetes     Past Surgical History:  Procedure Laterality Date  .  BREAST BIOPSY Left    needle core biopsy, benign  . CATARACT EXTRACTION    . HAND SURGERY    . KNEE SURGERY    . ROTATOR CUFF REPAIR    . TOTAL ABDOMINAL HYSTERECTOMY      Social History   Tobacco Use  . Smoking status: Never Smoker  . Smokeless tobacco: Never Used  Vaping Use  . Vaping Use: Never used  Substance Use Topics  . Alcohol use: Yes    Comment: rare  . Drug use: No    Family History  Problem Relation Age of Onset  . Atrial  fibrillation Mother   . Congestive Heart Failure Mother   . Depression Mother   . Heart disease Mother   . Stroke Mother   . Thyroid disease Mother   . Anxiety disorder Mother   . Obesity Mother   . Heart disease Father   . Alcohol abuse Father   . High blood pressure Father   . High Cholesterol Father   . Alcoholism Father   . Heart disease Brother   . Heart disease Brother   . Depression Sister   . Depression Daughter     Allergies  Allergen Reactions  . Dextran Anaphylaxis  . Dextrans   . Tree Extract   . Penicillin G Rash  . Penicillins Rash  . Sulfa Antibiotics Rash  . Sulfacetamide Sodium Rash    Medication list has been reviewed and updated.  Current Outpatient Medications on File Prior to Visit  Medication Sig Dispense Refill  . amLODipine (NORVASC) 5 MG tablet TAKE 1 TABLET BY MOUTH EVERY DAY 90 tablet 1  . atenolol (TENORMIN) 50 MG tablet TAKE 1 TABLET BY MOUTH EVERY DAY 90 tablet 1  . flecainide (TAMBOCOR) 100 MG tablet Take 1 tablet (100 mg total) by mouth 2 (two) times daily. 180 tablet 3  . FLUoxetine (PROZAC) 40 MG capsule TAKE 1 CAPSULE BY MOUTH TWICE A DAY 180 capsule 2  . levothyroxine (SYNTHROID) 125 MCG tablet Take 1 tablet (125 mcg total) by mouth daily before breakfast. 90 tablet 3  . omeprazole (PRILOSEC) 40 MG capsule Take 40 mg by mouth in the morning and at bedtime.    . rosuvastatin (CRESTOR) 20 MG tablet Take 1 tablet (20 mg total) by mouth daily. 90 tablet 3  . traZODone (DESYREL) 50 MG tablet Take 1.5 tablets (75 mg total) by mouth at bedtime. 135 tablet 3  . Vitamin D, Ergocalciferol, (DRISDOL) 1.25 MG (50000 UNIT) CAPS capsule Take 1 capsule (50,000 Units total) by mouth every 7 (seven) days. (Patient not taking: Reported on 01/24/2021) 4 capsule 0   No current facility-administered medications on file prior to visit.    Review of Systems:  As per HPI- otherwise negative.   Physical Examination: Vitals:   01/24/21 1514 01/24/21 1534   BP: (!) 146/82 120/70  Pulse: (!) 59   Resp: 18   Temp: 99.1 F (37.3 C)   SpO2: 94%    Vitals:   01/24/21 1514  Weight: 195 lb 2 oz (88.5 kg)  Height: 5\' 1"  (1.549 m)   Body mass index is 36.87 kg/m. Ideal Body Weight: Weight in (lb) to have BMI = 25: 132  GEN: no acute distress.  Obese, otherwise looks well.  Uses cane to walk HEENT: Atraumatic, Normocephalic.  Ears and Nose: No external deformity. CV: RRR, No M/G/R. No JVD. No thrill. No extra heart sounds. PULM: CTA B, no wheezes, crackles, rhonchi. No retractions. No resp.  distress. No accessory muscle use. ABD: S, NT, ND, +BS. No rebound. No HSM. EXTR: No c/c/e PSYCH: Normally interactive. Conversant.   She is getting around better, still does use a cane around the house.  She also has a walker which she will use when walking outdoors, when she was in [place] she was walking up to an hour at a time  BP Readings from Last 3 Encounters:  01/24/21 120/70  01/19/21 117/73  01/05/21 135/82    Assessment and Plan: Age related osteoporosis, unspecified pathological fracture presence - Plan: alendronate (FOSAMAX) 70 MG tablet, Comprehensive metabolic panel  Osteoarthritis of thoracolumbar spine, unspecified spinal osteoarthritis complication status  Chronic bilateral low back pain without sciatica - Plan: HYDROcodone-acetaminophen (NORCO/VICODIN) 5-325 MG tablet  Gastroesophageal reflux disease, unspecified whether esophagitis present - Plan: omeprazole (PRILOSEC) 40 MG capsule  Follow-up visit today, discussed recently diagnosed osteoporosis.  We discussed options for treatment, she is taking calcium and vitamin D.  She is trying to walk is much as possible.  She does take a chronic PPI, but has not been able to stop this due to return of GERD symptoms.  I prescribed Fosamax 70 mg once weekly, await renal function prior to filling prescription  Refill hydrocodone which she uses on rare occasion for more severe back pain.   She does have known history of of degenerative spine disease and compression fracture  Continue omeprazole for chronic GERD This visit occurred during the SARS-CoV-2 public health emergency.  Safety protocols were in place, including screening questions prior to the visit, additional usage of staff PPE, and extensive cleaning of exam room while observing appropriate contact time as indicated for disinfecting solutions.    Signed Lamar Blinks, MD  Received her labs 5/17- message to pt  Results for orders placed or performed in visit on 01/24/21  Comprehensive metabolic panel  Result Value Ref Range   Sodium 139 135 - 145 mEq/L   Potassium 4.2 3.5 - 5.1 mEq/L   Chloride 101 96 - 112 mEq/L   CO2 26 19 - 32 mEq/L   Glucose, Bld 99 70 - 99 mg/dL   BUN 23 6 - 23 mg/dL   Creatinine, Ser 0.81 0.40 - 1.20 mg/dL   Total Bilirubin 0.6 0.2 - 1.2 mg/dL   Alkaline Phosphatase 106 39 - 117 U/L   AST 43 (H) 0 - 37 U/L   ALT 39 (H) 0 - 35 U/L   Total Protein 6.7 6.0 - 8.3 g/dL   Albumin 4.0 3.5 - 5.2 g/dL   GFR 73.37 >60.00 mL/min   Calcium 9.6 8.4 - 10.5 mg/dL

## 2021-01-21 DIAGNOSIS — M545 Low back pain, unspecified: Secondary | ICD-10-CM | POA: Diagnosis not present

## 2021-01-21 DIAGNOSIS — Z7409 Other reduced mobility: Secondary | ICD-10-CM | POA: Diagnosis not present

## 2021-01-24 ENCOUNTER — Encounter (INDEPENDENT_AMBULATORY_CARE_PROVIDER_SITE_OTHER): Payer: Self-pay | Admitting: Bariatrics

## 2021-01-24 ENCOUNTER — Encounter: Payer: Self-pay | Admitting: Family Medicine

## 2021-01-24 ENCOUNTER — Ambulatory Visit (INDEPENDENT_AMBULATORY_CARE_PROVIDER_SITE_OTHER): Payer: Medicare Other | Admitting: Family Medicine

## 2021-01-24 ENCOUNTER — Other Ambulatory Visit: Payer: Self-pay

## 2021-01-24 VITALS — BP 120/70 | HR 59 | Temp 99.1°F | Resp 18 | Ht 61.0 in | Wt 195.1 lb

## 2021-01-24 DIAGNOSIS — G8929 Other chronic pain: Secondary | ICD-10-CM

## 2021-01-24 DIAGNOSIS — K219 Gastro-esophageal reflux disease without esophagitis: Secondary | ICD-10-CM

## 2021-01-24 DIAGNOSIS — M545 Low back pain, unspecified: Secondary | ICD-10-CM | POA: Diagnosis not present

## 2021-01-24 DIAGNOSIS — M810 Age-related osteoporosis without current pathological fracture: Secondary | ICD-10-CM

## 2021-01-24 DIAGNOSIS — M47815 Spondylosis without myelopathy or radiculopathy, thoracolumbar region: Secondary | ICD-10-CM

## 2021-01-24 MED ORDER — ALENDRONATE SODIUM 70 MG PO TABS: 70.0000 mg | ORAL_TABLET | ORAL | 3 refills | Status: DC

## 2021-01-24 MED ORDER — HYDROCODONE-ACETAMINOPHEN 5-325 MG PO TABS: 1.0000 | ORAL_TABLET | Freq: Three times a day (TID) | ORAL | 0 refills | Status: AC | PRN

## 2021-01-24 NOTE — Patient Instructions (Signed)
It was great to see you again today, I am glad to see you looking so well!  I will be in touch with your labs ASAP.  Assuming your kidney function is normal, you can start on Fosamax once a week to build bone density.  We can plan to repeat a bone density scan in 1 to 2 years.  I also refilled your hydrocodone to use as needed for occasional more severe back pain  Assuming all is well, please see me in about 6 months  I do recommend getting your shingles vaccine and your fourth dose of COVID

## 2021-01-25 ENCOUNTER — Ambulatory Visit (INDEPENDENT_AMBULATORY_CARE_PROVIDER_SITE_OTHER): Payer: Medicare Other | Admitting: Cardiology

## 2021-01-25 ENCOUNTER — Encounter: Payer: Self-pay | Admitting: Family Medicine

## 2021-01-25 ENCOUNTER — Encounter: Payer: Self-pay | Admitting: Cardiology

## 2021-01-25 VITALS — BP 112/68 | HR 53 | Ht 61.0 in | Wt 194.4 lb

## 2021-01-25 DIAGNOSIS — I480 Paroxysmal atrial fibrillation: Secondary | ICD-10-CM | POA: Diagnosis not present

## 2021-01-25 DIAGNOSIS — I10 Essential (primary) hypertension: Secondary | ICD-10-CM | POA: Diagnosis not present

## 2021-01-25 DIAGNOSIS — E7800 Pure hypercholesterolemia, unspecified: Secondary | ICD-10-CM | POA: Diagnosis not present

## 2021-01-25 LAB — COMPREHENSIVE METABOLIC PANEL
ALT: 39 U/L — ABNORMAL HIGH (ref 0–35)
AST: 43 U/L — ABNORMAL HIGH (ref 0–37)
Albumin: 4 g/dL (ref 3.5–5.2)
Alkaline Phosphatase: 106 U/L (ref 39–117)
BUN: 23 mg/dL (ref 6–23)
CO2: 26 mEq/L (ref 19–32)
Calcium: 9.6 mg/dL (ref 8.4–10.5)
Chloride: 101 mEq/L (ref 96–112)
Creatinine, Ser: 0.81 mg/dL (ref 0.40–1.20)
GFR: 73.37 mL/min (ref >60.00)
Glucose, Bld: 99 mg/dL (ref 70–99)
Potassium: 4.2 mEq/L (ref 3.5–5.1)
Sodium: 139 mEq/L (ref 135–145)
Total Bilirubin: 0.6 mg/dL (ref 0.2–1.2)
Total Protein: 6.7 g/dL (ref 6.0–8.3)

## 2021-01-25 NOTE — Patient Instructions (Signed)

## 2021-01-27 DIAGNOSIS — M545 Low back pain, unspecified: Secondary | ICD-10-CM | POA: Diagnosis not present

## 2021-01-27 DIAGNOSIS — Z7409 Other reduced mobility: Secondary | ICD-10-CM | POA: Diagnosis not present

## 2021-01-28 ENCOUNTER — Other Ambulatory Visit: Payer: Self-pay | Admitting: Family Medicine

## 2021-01-28 ENCOUNTER — Telehealth: Payer: Self-pay | Admitting: *Deleted

## 2021-01-28 ENCOUNTER — Telehealth: Payer: Self-pay | Admitting: Family Medicine

## 2021-01-28 DIAGNOSIS — E559 Vitamin D deficiency, unspecified: Secondary | ICD-10-CM

## 2021-01-28 DIAGNOSIS — E034 Atrophy of thyroid (acquired): Secondary | ICD-10-CM

## 2021-01-28 NOTE — Telephone Encounter (Signed)
I have attempted to call back to go over lab results. There was no answer so I left a message to call back.  Message that pt sent to pt was:  "Your kidney function is fine to start Fosamax Mildly elevated liver tests is likely due to fatty liver   Take care, please see me in about 6 months "

## 2021-01-28 NOTE — Telephone Encounter (Signed)
Pt,would like her lab results explain to her   Please advice

## 2021-01-28 NOTE — Telephone Encounter (Signed)
Patient would like to if you could send her vit d into [facility].  The other office sent to mail order and with local [facility] can use [facility].  Patient initially got from [facility] (that's why I did not send in).

## 2021-01-29 ENCOUNTER — Encounter: Payer: Self-pay | Admitting: Family Medicine

## 2021-01-29 MED ORDER — VITAMIN D (ERGOCALCIFEROL) 1.25 MG (50000 UNIT) PO CAPS: 50000.0000 [IU] | ORAL_CAPSULE | ORAL | 0 refills | Status: DC

## 2021-02-01 DIAGNOSIS — M545 Low back pain, unspecified: Secondary | ICD-10-CM | POA: Diagnosis not present

## 2021-02-01 DIAGNOSIS — Z7409 Other reduced mobility: Secondary | ICD-10-CM | POA: Diagnosis not present

## 2021-02-03 DIAGNOSIS — M545 Low back pain, unspecified: Secondary | ICD-10-CM | POA: Diagnosis not present

## 2021-02-03 DIAGNOSIS — Z7409 Other reduced mobility: Secondary | ICD-10-CM | POA: Diagnosis not present

## 2021-02-08 ENCOUNTER — Ambulatory Visit (INDEPENDENT_AMBULATORY_CARE_PROVIDER_SITE_OTHER): Payer: Medicare Other | Admitting: Bariatrics

## 2021-02-08 ENCOUNTER — Other Ambulatory Visit: Payer: Self-pay

## 2021-02-08 ENCOUNTER — Encounter (HOSPITAL_BASED_OUTPATIENT_CLINIC_OR_DEPARTMENT_OTHER): Payer: Self-pay | Admitting: *Deleted

## 2021-02-08 ENCOUNTER — Encounter (INDEPENDENT_AMBULATORY_CARE_PROVIDER_SITE_OTHER): Payer: Self-pay | Admitting: Bariatrics

## 2021-02-08 ENCOUNTER — Emergency Department (HOSPITAL_BASED_OUTPATIENT_CLINIC_OR_DEPARTMENT_OTHER)
Admission: EM | Admit: 2021-02-08 | Discharge: 2021-02-08 | Disposition: A | Discharge status: Home / Self Care | Payer: Medicare Other | Attending: Emergency Medicine | Admitting: Emergency Medicine

## 2021-02-08 ENCOUNTER — Emergency Department (HOSPITAL_BASED_OUTPATIENT_CLINIC_OR_DEPARTMENT_OTHER): Payer: Medicare Other

## 2021-02-08 VITALS — BP 117/73 | HR 52 | Temp 98.4°F | Ht 61.0 in | Wt 189.0 lb

## 2021-02-08 DIAGNOSIS — S80211A Abrasion, right knee, initial encounter: Secondary | ICD-10-CM | POA: Diagnosis not present

## 2021-02-08 DIAGNOSIS — K5909 Other constipation: Secondary | ICD-10-CM

## 2021-02-08 DIAGNOSIS — Z6837 Body mass index (BMI) 37.0-37.9, adult: Secondary | ICD-10-CM

## 2021-02-08 DIAGNOSIS — Z79899 Other long term (current) drug therapy: Secondary | ICD-10-CM | POA: Diagnosis not present

## 2021-02-08 DIAGNOSIS — Y92017 Garden or yard in single-family (private) house as the place of occurrence of the external cause: Secondary | ICD-10-CM | POA: Insufficient documentation

## 2021-02-08 DIAGNOSIS — M25531 Pain in right wrist: Secondary | ICD-10-CM | POA: Diagnosis not present

## 2021-02-08 DIAGNOSIS — I10 Essential (primary) hypertension: Secondary | ICD-10-CM

## 2021-02-08 DIAGNOSIS — S6991XA Unspecified injury of right wrist, hand and finger(s), initial encounter: Secondary | ICD-10-CM | POA: Diagnosis present

## 2021-02-08 DIAGNOSIS — K219 Gastro-esophageal reflux disease without esophagitis: Secondary | ICD-10-CM | POA: Diagnosis not present

## 2021-02-08 DIAGNOSIS — W010XXA Fall on same level from slipping, tripping and stumbling without subsequent striking against object, initial encounter: Secondary | ICD-10-CM | POA: Diagnosis not present

## 2021-02-08 DIAGNOSIS — S63501A Unspecified sprain of right wrist, initial encounter: Secondary | ICD-10-CM | POA: Insufficient documentation

## 2021-02-08 DIAGNOSIS — E039 Hypothyroidism, unspecified: Secondary | ICD-10-CM | POA: Insufficient documentation

## 2021-02-08 NOTE — ED Triage Notes (Signed)
She fell while working in her garden today. Right wrist injury. She took a Vicodin 2 hours ago.

## 2021-02-08 NOTE — ED Provider Notes (Signed)
[facility] EMERGENCY DEPARTMENT Provider Note  CSN: [id number] Arrival date & time: 02/08/21 2150    History Chief Complaint  Patient presents with  . Fall  . Wrist Injury    HPI  Lisa Acosta is a 70 y.o. female reports she stumbled and fell in the yard this afternoon, injuring her R wrist. She is not sure how she landed. She reports moderate to severe pain, worse with movement. She took some vicodin at home earlier today without improvement. Denies head injury or LOC.    Past Medical History:  Diagnosis Date  . Atrial fibrillation ([place])   . Back pain   . Constipation   . Depression   . Diverticulitis   . Diverticulitis   . Diverticulosis   . Esophageal ulcer   . Fatty liver   . Gait abnormality 10/14/2018  . GERD (gastroesophageal reflux disease)   . Hyperlipidemia   . Hypertension   . Hypothyroid   . Memory difficulty 08/18/2019  . OSA (obstructive sleep apnea) 08/31/2016  . Osteopenia   . 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 Pre-diabetes     Past Surgical History:  Procedure Laterality Date  . BREAST BIOPSY Left    needle core biopsy, benign  . CATARACT EXTRACTION    . HAND SURGERY    . KNEE SURGERY    . ROTATOR CUFF REPAIR    . TOTAL ABDOMINAL HYSTERECTOMY      Family History  Problem Relation Age of Onset  . Atrial fibrillation Mother   . Congestive Heart Failure Mother   . Depression Mother   . Heart disease Mother   . Stroke Mother   . Thyroid disease Mother   . Anxiety disorder Mother   . Obesity Mother   . Heart disease Father   . Alcohol abuse Father   . High blood pressure Father   . High Cholesterol Father   . Alcoholism Father   . Heart disease Brother   . Heart disease Brother   . Depression Sister   . Depression Daughter     Social History   Tobacco Use  . Smoking status: Never Smoker  . Smokeless tobacco: Never Used  Vaping Use  . Vaping Use: Never used  Substance Use Topics  . Alcohol use: Yes     Comment: rare  . Drug use: No     Home Medications Prior to Admission medications   Medication Sig Start Date End Date Taking? Authorizing Provider  alendronate (FOSAMAX) 70 MG tablet Take 1 tablet (70 mg total) by mouth every 7 (seven) days. Take with a full glass of water on an empty stomach. 01/24/21   Copland, Gay Filler, MD  amLODipine (NORVASC) 5 MG tablet TAKE 1 TABLET BY MOUTH EVERY DAY 09/06/20   Copland, Gay Filler, MD  atenolol (TENORMIN) 50 MG tablet TAKE 1 TABLET BY MOUTH EVERY DAY 12/30/20   Copland, Gay Filler, MD  flecainide (TAMBOCOR) 100 MG tablet Take 1 tablet (100 mg total) by mouth 2 (two) times daily. 10/27/20   Copland, Gay Filler, MD  FLUoxetine (PROZAC) 40 MG capsule TAKE 1 CAPSULE BY MOUTH TWICE A DAY 08/02/20   Copland, Gay Filler, MD  levothyroxine (SYNTHROID) 125 MCG tablet TAKE 1 TABLET (125 MCG TOTAL) BY MOUTH DAILY BEFORE BREAKFAST. 01/28/21   Copland, Gay Filler, MD  omeprazole (PRILOSEC) 40 MG capsule Take 40 mg by mouth in the morning and at bedtime.    [provider]  rivaroxaban (XARELTO) 20 MG  TABS tablet Take 20 mg by mouth daily with supper.    [provider]  rosuvastatin (CRESTOR) 20 MG tablet Take 1 tablet (20 mg total) by mouth daily. 12/20/20 12/15/21  Copland, Gay Filler, MD  traZODone (DESYREL) 50 MG tablet Take 1.5 tablets (75 mg total) by mouth at bedtime. 02/03/20 05/03/20  Copland, Gay Filler, MD  Vitamin D, Ergocalciferol, (DRISDOL) 1.25 MG (50000 UNIT) CAPS capsule Take 1 capsule (50,000 Units total) by mouth every 7 (seven) days. 01/29/21   Copland, Gay Filler, MD     Allergies    Dextran, Dextrans, Tree extract, Penicillin g, Penicillins, Sulfa antibiotics, and Sulfacetamide sodium   Review of Systems   Review of Systems A comprehensive review of systems was completed and negative except as noted in HPI.    Physical Exam BP (!) 129/59 (BP Location: Left Arm)   Pulse (!) 55   Temp 98.6 F (37 C) (Oral)   Resp 20   Ht 5'  1" (1.549 m)   Wt 85.7 kg   SpO2 96%   BMI 35.71 kg/m   Physical Exam Vitals and nursing note reviewed.  Constitutional:      Appearance: Normal appearance.  HENT:     Head: Normocephalic and atraumatic.     Nose: Nose normal.     Mouth/Throat:     Mouth: Mucous membranes are moist.  Eyes:     Extraocular Movements: Extraocular movements intact.     Conjunctiva/sclera: Conjunctivae normal.  Cardiovascular:     Rate and Rhythm: Normal rate.  Pulmonary:     Effort: Pulmonary effort is normal.     Breath sounds: Normal breath sounds.  Abdominal:     General: Abdomen is flat.     Palpations: Abdomen is soft.     Tenderness: There is no abdominal tenderness.  Musculoskeletal:        General: Swelling and tenderness (R wrist, radial and ulnar including mild snuffbox tenderness) present. Normal range of motion.     Cervical back: Neck supple.  Skin:    General: Skin is warm and dry.     Comments: Superficial abrasion R knee  Neurological:     General: No focal deficit present.     Mental Status: She is alert.  Psychiatric:        Mood and Affect: Mood normal.      ED Results / Procedures / Treatments   Labs (all labs ordered are listed, but only abnormal results are displayed) Labs Reviewed - No data to display  EKG None   Radiology DG Wrist Complete Right  Result Date: 02/08/2021 CLINICAL DATA:  Fall, right wrist pain EXAM: RIGHT WRIST - COMPLETE 3+ VIEW COMPARISON:  None. FINDINGS: Degenerative changes at the 1st carpometacarpal joint. No acute bony abnormality. Specifically, no fracture, subluxation, or dislocation. IMPRESSION: No acute bony abnormality. Electronically Signed   By: Rolm Baptise M.D.   On: 02/08/2021 22:25    Procedures Procedures  Medications Ordered in the ED Medications - No data to display   MDM Rules/Calculators/A&P MDM Xray images reviewed, neg for fracture. Discussed occult scaphoid fracture with patient, will place in thumb spica  and send to PCP for repeat xray in a week. RTED for any other concerns.  ED Course  I have reviewed the triage vital signs and the nursing notes.  Pertinent labs & imaging results that were available during my care of the patient were reviewed by me and considered in my medical decision making (see chart  for details).     Final Clinical Impression(s) / ED Diagnoses Final diagnoses:  Sprain of right wrist, initial encounter    Rx / DC Orders ED Discharge Orders    None       Truddie Hidden, MD 02/08/21 2251

## 2021-02-10 NOTE — Progress Notes (Signed)
Chief Complaint:   OBESITY Lisa Acosta is here to discuss her progress with her obesity treatment plan along with follow-up of her obesity related diagnoses. Lisa Acosta is on the Category 1 Plan and states she is following her eating plan approximately 90% of the time. Lisa Acosta states she is not exercising regularly.  Today's visit was #: 3 Starting weight: 198 lbs Starting date: 01/05/2021 Today's weight: 189 lbs Today's date: 02/08/2021 Total lbs lost to date: 2 lbs Total lbs lost since last in-office visit: 9 lbs  Interim History: Lisa Acosta is down an additional 2 pounds.  She had problems with constipation.  Taking Benefiber.  Subjective:   1. Essential hypertension Blood pressure is controlled.  Review: taking medications as instructed, no medication side effects noted, no chest pain on exertion, no dyspnea on exertion, no swelling of ankles.  Taking Norvasc and Tenormin.  BP Readings from Last 3 Encounters:  02/08/21 (!) 129/59  02/08/21 117/73  01/25/21 112/68   2. Gastroesophageal reflux disease, unspecified whether esophagitis present Taking Prilosec.  3. Other constipation Taking Benefiber and has increased protein intake.  Assessment/Plan:   1. Essential hypertension Lisa Acosta is working on healthy weight loss and exercise to improve blood pressure control. We will watch for signs of hypotension as she continues her lifestyle modifications.  Continue medications.  2. Gastroesophageal reflux disease, unspecified whether esophagitis present Intensive lifestyle modifications are the first line treatment for this issue. We discussed several lifestyle modifications today and she will continue to work on diet, exercise and weight loss efforts. Orders and follow up as documented in patient record.  Continue Prilosec.  Counseling . If a person has gastroesophageal reflux disease (GERD), food and stomach acid move back up into the esophagus and cause symptoms or problems such as damage to  the esophagus. . Anti-reflux measures include: raising the head of the bed, avoiding tight clothing or belts, avoiding eating late at night, not lying down shortly after mealtime, and achieving weight loss. . Avoid ASA, NSAID's, caffeine, alcohol, and tobacco.  . OTC Pepcid and/or Tums are often very helpful for as needed use.  [name] However, for persisting chronic or daily symptoms, stronger medications like Omeprazole may be needed. . You may need to avoid foods and drinks such as: ? Coffee and tea (with or without caffeine). ? Drinks that contain alcohol. ? Energy drinks and sports drinks. ? Bubbly (carbonated) drinks or sodas. ? Chocolate and cocoa. ? Peppermint and mint flavorings. ? Garlic and onions. ? Horseradish. ? Spicy and acidic foods. These include peppers, chili powder, curry powder, vinegar, hot sauces, and BBQ sauce. ? Citrus fruit juices and citrus fruits, such as oranges, lemons, and limes. ? Tomato-based foods. These include red sauce, chili, salsa, and pizza with red sauce. ? Fried and fatty foods. These include donuts, french fries, potato chips, and high-fat dressings. ? High-fat meats. These include hot dogs, rib eye steak, sausage, ham, and bacon.  3. Other constipation Lisa Acosta was informed that a decrease in bowel movement frequency is normal while losing weight, but stools should not be hard or painful. Orders and follow up as documented in patient record.  Take Dulcolax or Milk of Magnesia.  Take MiraLAX daily with water.  Increase raw fruits and vegetables.  Fleet suppositories.  Use if stool in colon.  Counseling Getting to Good Bowel Health: Your goal is to have one soft bowel movement each day. Drink at least 8 glasses of water each day. Eat plenty of fiber (goal  is over 25 grams each day). It is best to get most of your fiber from dietary sources which includes leafy green vegetables, fresh fruit, and whole grains. You may need to add fiber with the help of OTC fiber  supplements. These include Metamucil, Citrucel, and Flaxseed. If you are still having trouble, try adding Miralax or Magnesium Citrate. If all of these changes do not work, Cabin crew.  4. Obesity, current BMI 35  Lisa Acosta is currently in the action stage of change. As such, her goal is to continue with weight loss efforts. She has agreed to the Category 1 Plan.   She will work on meal planning and mindful eating.  Exercise goals: Older adults should follow the adult guidelines. When older adults cannot meet the adult guidelines, they should be as physically active as their abilities and conditions will allow.  Older adults should do exercises that maintain or improve balance if they are at risk of falling.   Behavioral modification strategies: increasing lean protein intake, decreasing simple carbohydrates, increasing vegetables, increasing water intake, decreasing eating out, no skipping meals, meal planning and cooking strategies, keeping healthy foods in the home and planning for success.  Lisa Acosta has agreed to follow-up with our clinic in 2 weeks. She was informed of the importance of frequent follow-up visits to maximize her success with intensive lifestyle modifications for her multiple health conditions.   Objective:   Blood pressure 117/73, pulse (!) 52, temperature 98.4 F (36.9 C), height 5\' 1"  (1.549 m), weight 189 lb (85.7 kg), SpO2 94 %. Body mass index is 35.71 kg/m.  General: Cooperative, alert, well developed, in no acute distress. HEENT: Conjunctivae and lids unremarkable. Cardiovascular: Regular rhythm.  Lungs: Normal work of breathing. Neurologic: No focal deficits.   Lab Results  Component Value Date   CREATININE 0.81 01/24/2021   BUN 23 01/24/2021   NA 139 01/24/2021   K 4.2 01/24/2021   CL 101 01/24/2021   CO2 26 01/24/2021   Lab Results  Component Value Date   ALT 39 (H) 01/24/2021   AST 43 (H) 01/24/2021   ALKPHOS 106 01/24/2021   BILITOT 0.6  01/24/2021   Lab Results  Component Value Date   HGBA1C 6.2 (H) 01/05/2021   HGBA1C 5.9 06/23/2019   HGBA1C 5.8 04/15/2018   HGBA1C 5.8 08/13/2017   HGBA1C 6.0 01/22/2017   Lab Results  Component Value Date   INSULIN 20.4 01/05/2021   Lab Results  Component Value Date   TSH 2.10 11/03/2020   Lab Results  Component Value Date   CHOL 186 01/05/2021   HDL 67 01/05/2021   LDLCALC 95 01/05/2021   LDLDIRECT 112.0 01/22/2017   TRIG 138 01/05/2021   CHOLHDL 2.9 06/23/2020   Lab Results  Component Value Date   WBC 6.2 11/03/2020   HGB 13.7 11/03/2020   HCT 41.4 11/03/2020   MCV 86.7 11/03/2020   PLT 275.0 11/03/2020   Obesity Behavioral Intervention:   Approximately 15 minutes were spent on the discussion below.  ASK: We discussed the diagnosis of obesity with Lagena today and Karalyne agreed to give [place] permission to discuss obesity behavioral modification therapy today.  ASSESS: Brinda has the diagnosis of obesity and her BMI today is 35.8. Amarissa is in the action stage of change.   ADVISE: Naidelyn was educated on the multiple health risks of obesity as well as the benefit of weight loss to improve her health. She was advised of the need for long term treatment  and the importance of lifestyle modifications to improve her current health and to decrease her risk of future health problems.  AGREE: Multiple dietary modification options and treatment options were discussed and Armanie agreed to follow the recommendations documented in the above note.  ARRANGE: Aymee was educated on the importance of frequent visits to treat obesity as outlined per CMS and USPSTF guidelines and agreed to schedule her next follow up appointment today.  Attestation Statements:   Reviewed by clinician on day of visit: allergies, medications, problem list, medical history, surgical history, family history, social history, and previous encounter notes.  I, Water quality scientist, CMA, am acting as Location manager  for [employer], DO  I have reviewed the above documentation for accuracy and completeness, and I agree with the above. Jearld Lesch, DO

## 2021-02-14 ENCOUNTER — Ambulatory Visit (INDEPENDENT_AMBULATORY_CARE_PROVIDER_SITE_OTHER): Payer: Medicare Other | Admitting: Family

## 2021-02-14 ENCOUNTER — Encounter (INDEPENDENT_AMBULATORY_CARE_PROVIDER_SITE_OTHER): Payer: Self-pay | Admitting: Bariatrics

## 2021-02-14 ENCOUNTER — Other Ambulatory Visit: Payer: Self-pay

## 2021-02-14 ENCOUNTER — Ambulatory Visit (HOSPITAL_BASED_OUTPATIENT_CLINIC_OR_DEPARTMENT_OTHER)
Admission: RE | Admit: 2021-02-14 | Discharge: 2021-02-14 | Disposition: A | Discharge status: Home / Self Care | Payer: Medicare Other | Source: Ambulatory Visit | Attending: Family | Admitting: Family

## 2021-02-14 VITALS — BP 136/58 | HR 53 | Temp 98.8°F | Resp 16 | Wt 191.2 lb

## 2021-02-14 DIAGNOSIS — M79641 Pain in right hand: Secondary | ICD-10-CM | POA: Diagnosis not present

## 2021-02-14 DIAGNOSIS — M25531 Pain in right wrist: Secondary | ICD-10-CM | POA: Diagnosis not present

## 2021-02-14 NOTE — Progress Notes (Signed)
Subjective:   By signing my name below, I, Shehryar Baig, attest that this documentation has been prepared under the direction and in the presence of Debbrah Alar NP. 02/14/2021     Patient ID: Lisa Acosta, female    DOB: May 08, 1951, 70 y.o.   MRN: [id number]  Chief Complaint  Patient presents with  . Follow-up    ED follow up, right wrist injury after falling.    HPI Patient is in today for a office visit. She presented to the ED on 02/08/21 following a fall with wrist injury. She had a mechanical fall in the yard while doing lab work. Right wrist x-ray was performed which noted no acute abnormality. EDP was concerned about possibility of occult scaphoid fracture and sent her home with a Thumb Spica and advised her to follow up with her PCP in 1 week.   She reports on 02/08/2021 while gardening she fell on her front side on  he hurt her knees, chest, hands, and chest during. She went to the ED and they diagnosed her with a sprain of the right wrist. After spraining it she developed painful swelling in her right hand followed by some discoloration of her skin. She puts ice on it several times a day and finds relief only while using it.  She also complains of some pain in her chest since that incident but notes that it is manageable compared to her right hand pain.   Health Maintenance Due  Topic Date Due  . Pneumococcal Vaccine 87-62 Years old (1 of 4 - PCV13) Never done  . Zoster Vaccines- Shingrix (1 of 2) Never done    Past Medical History:  Diagnosis Date  . Atrial fibrillation ([place])   . Back pain   . Constipation   . Depression   . Diverticulitis   . Diverticulitis   . Diverticulosis   . Esophageal ulcer   . Fatty liver   . Gait abnormality 10/14/2018  . GERD (gastroesophageal reflux disease)   . Hyperlipidemia   . Hypertension   . Hypothyroid   . Memory difficulty 08/18/2019  . OSA (obstructive sleep apnea) 08/31/2016  . Osteopenia   . 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 Pre-diabetes     Past Surgical History:  Procedure Laterality Date  . BREAST BIOPSY Left    needle core biopsy, benign  . CATARACT EXTRACTION    . HAND SURGERY    . KNEE SURGERY    . ROTATOR CUFF REPAIR    . TOTAL ABDOMINAL HYSTERECTOMY      Family History  Problem Relation Age of Onset  . Atrial fibrillation Mother   . Congestive Heart Failure Mother   . Depression Mother   . Heart disease Mother   . Stroke Mother   . Thyroid disease Mother   . Anxiety disorder Mother   . Obesity Mother   . Heart disease Father   . Alcohol abuse Father   . High blood pressure Father   . High Cholesterol Father   . Alcoholism Father   . Heart disease Brother   . Heart disease Brother   . Depression Sister   . Depression Daughter     Social History   Socioeconomic History  . Marital status: Married    Spouse name: Jenny Reichmann  . Number of children: 1  . Years of education: College  . Highest education level: Not on file  Occupational History  . Occupation: Retired    Fish farm manager: OTHER  Comment: High  [employer]  Tobacco Use  . Smoking status: Never Smoker  . Smokeless tobacco: Never Used  Vaping Use  . Vaping Use: Never used  Substance and Sexual Activity  . Alcohol use: Yes    Comment: rare  . Drug use: No  . Sexual activity: Not on file  Other Topics Concern  . Not on file  Social History Narrative   Patient lives at home spouse.   Caffeine use: 2 sodas weekly   Right handed    Social Determinants of Health   Financial Resource Strain: Not on file  Food Insecurity: Not on file  Transportation Needs: Not on file  Physical Activity: Not on file  Stress: Not on file  Social Connections: Not on file  Intimate Partner Violence: Not on file    Outpatient Medications Prior to Visit  Medication Sig Dispense Refill  . alendronate (FOSAMAX) 70 MG tablet Take 1 tablet (70 mg total) by mouth every 7 (seven) days. Take with a full glass of  water on an empty stomach. 12 tablet 3  . amLODipine (NORVASC) 5 MG tablet TAKE 1 TABLET BY MOUTH EVERY DAY 90 tablet 1  . atenolol (TENORMIN) 50 MG tablet TAKE 1 TABLET BY MOUTH EVERY DAY 90 tablet 1  . flecainide (TAMBOCOR) 100 MG tablet Take 1 tablet (100 mg total) by mouth 2 (two) times daily. 180 tablet 3  . FLUoxetine (PROZAC) 40 MG capsule TAKE 1 CAPSULE BY MOUTH TWICE A DAY 180 capsule 2  . levothyroxine (SYNTHROID) 125 MCG tablet TAKE 1 TABLET (125 MCG TOTAL) BY MOUTH DAILY BEFORE BREAKFAST. 90 tablet 3  . omeprazole (PRILOSEC) 40 MG capsule Take 40 mg by mouth in the morning and at bedtime.    . rivaroxaban (XARELTO) 20 MG TABS tablet Take 20 mg by mouth daily with supper.    . rosuvastatin (CRESTOR) 20 MG tablet Take 1 tablet (20 mg total) by mouth daily. 90 tablet 3  . Vitamin D, Ergocalciferol, (DRISDOL) 1.25 MG (50000 UNIT) CAPS capsule Take 1 capsule (50,000 Units total) by mouth every 7 (seven) days. 4 capsule 0  . traZODone (DESYREL) 50 MG tablet Take 1.5 tablets (75 mg total) by mouth at bedtime. 135 tablet 3   No facility-administered medications prior to visit.    Allergies  Allergen Reactions  . Dextran Anaphylaxis  . Dextrans   . Tree Extract   . Penicillin G Rash  . Penicillins Rash  . Sulfa Antibiotics Rash  . Sulfacetamide Sodium Rash    Review of Systems  Musculoskeletal: Positive for joint pain (Right wrist) and myalgias (Right hand , and chest).       Objective:    Physical Exam Constitutional:      General: She is not in acute distress.    Appearance: Normal appearance. She is not ill-appearing.  HENT:     Head: Normocephalic and atraumatic.     Right Ear: External ear normal.     Left Ear: External ear normal.  Eyes:     Extraocular Movements: Extraocular movements intact.     Pupils: Pupils are equal, round, and reactive to light.  Cardiovascular:     Rate and Rhythm: Normal rate and regular rhythm.     Pulses: Normal pulses.     Heart  sounds: Normal heart sounds. No murmur heard. No gallop.   Pulmonary:     Effort: Pulmonary effort is normal. No respiratory distress.     Breath sounds: Normal breath sounds. No  wheezing, rhonchi or rales.  Musculoskeletal:     Right wrist: Tenderness present.     Right hand: Tenderness (dorsal right hand) present.     Comments: tenderness to palpation overlying right wrist and dorsal right hand  Skin:    General: Skin is warm and dry.  Neurological:     Mental Status: She is alert and oriented to person, place, and time.  Psychiatric:        Behavior: Behavior normal.     BP (!) 136/58 (BP Location: Left Arm, Patient Position: Sitting, Cuff Size: Small)   Pulse (!) 53   Temp 98.8 F (37.1 C) (Oral)   Resp 16   Wt 191 lb 3.2 oz (86.7 kg)   SpO2 95%   BMI 36.13 kg/m  Wt Readings from Last 3 Encounters:  02/14/21 191 lb 3.2 oz (86.7 kg)  02/08/21 189 lb (85.7 kg)  02/08/21 189 lb (85.7 kg)       Assessment & Plan:   Problem List Items Addressed This Visit      Unprioritized   Right wrist pain - Primary    Will repeat x-ray to re-evaluate for occult fracture. In the meantime, I advised her to continue to wear thumb spica until pain is improved. Will plan orthopedic fracture if + for fracture.       Relevant Orders   DG Wrist Complete Right   DG Hand Complete Right       No orders of the defined types were placed in this encounter.   I, Debbrah Alar NP, personally preformed the services described in this documentation.  All medical record entries made by the scribe were at my direction and in my presence.  I have reviewed the chart and discharge instructions (if applicable) and agree that the record reflects my personal performance and is accurate and complete.   I,Shehryar Multimedia programmer as a Education administrator for [employer], NP.,have documented all relevant documentation on the behalf of Nance Pear, NP,as directed by  Nance Pear, NP while  in the presence of Nance Pear, NP.   Nance Pear, NP

## 2021-02-14 NOTE — Assessment & Plan Note (Addendum)
Will repeat x-ray to re-evaluate for occult fracture. In the meantime, I advised her to continue to wear thumb spica until pain is improved. Will plan orthopedic referral if + for fracture.

## 2021-02-14 NOTE — Patient Instructions (Signed)
Please complete x-ray on the first floor.  

## 2021-02-16 DIAGNOSIS — R2689 Other abnormalities of gait and mobility: Secondary | ICD-10-CM | POA: Diagnosis not present

## 2021-02-16 DIAGNOSIS — R413 Other amnesia: Secondary | ICD-10-CM | POA: Diagnosis not present

## 2021-02-22 ENCOUNTER — Encounter: Payer: Self-pay | Admitting: Family Medicine

## 2021-02-22 DIAGNOSIS — I480 Paroxysmal atrial fibrillation: Secondary | ICD-10-CM

## 2021-02-22 MED ORDER — AMLODIPINE BESYLATE 5 MG PO TABS: 1.0000 | ORAL_TABLET | Freq: Every day | ORAL | 1 refills | Status: DC

## 2021-02-28 ENCOUNTER — Ambulatory Visit (INDEPENDENT_AMBULATORY_CARE_PROVIDER_SITE_OTHER): Payer: Medicare Other | Admitting: Bariatrics

## 2021-03-01 DIAGNOSIS — R296 Repeated falls: Secondary | ICD-10-CM | POA: Diagnosis not present

## 2021-03-01 DIAGNOSIS — R413 Other amnesia: Secondary | ICD-10-CM | POA: Diagnosis not present

## 2021-03-01 DIAGNOSIS — R2689 Other abnormalities of gait and mobility: Secondary | ICD-10-CM | POA: Diagnosis not present

## 2021-03-03 ENCOUNTER — Other Ambulatory Visit: Payer: Self-pay | Admitting: Family Medicine

## 2021-03-03 DIAGNOSIS — G4700 Insomnia, unspecified: Secondary | ICD-10-CM

## 2021-03-16 DIAGNOSIS — Z23 Encounter for immunization: Secondary | ICD-10-CM | POA: Diagnosis not present

## 2021-03-16 DIAGNOSIS — Z20822 Contact with and (suspected) exposure to covid-19: Secondary | ICD-10-CM | POA: Diagnosis not present

## 2021-03-17 ENCOUNTER — Encounter (INDEPENDENT_AMBULATORY_CARE_PROVIDER_SITE_OTHER): Payer: Self-pay | Admitting: Bariatrics

## 2021-03-17 ENCOUNTER — Other Ambulatory Visit: Payer: Self-pay

## 2021-03-17 ENCOUNTER — Ambulatory Visit (INDEPENDENT_AMBULATORY_CARE_PROVIDER_SITE_OTHER): Payer: Medicare Other | Admitting: Bariatrics

## 2021-03-17 VITALS — BP 143/78 | HR 52 | Temp 99.4°F | Ht 61.0 in | Wt 181.0 lb

## 2021-03-17 DIAGNOSIS — K219 Gastro-esophageal reflux disease without esophagitis: Secondary | ICD-10-CM

## 2021-03-17 DIAGNOSIS — Z6837 Body mass index (BMI) 37.0-37.9, adult: Secondary | ICD-10-CM | POA: Diagnosis not present

## 2021-03-17 DIAGNOSIS — I10 Essential (primary) hypertension: Secondary | ICD-10-CM

## 2021-03-20 DIAGNOSIS — Z20822 Contact with and (suspected) exposure to covid-19: Secondary | ICD-10-CM | POA: Diagnosis not present

## 2021-03-21 DIAGNOSIS — F321 Major depressive disorder, single episode, moderate: Secondary | ICD-10-CM | POA: Diagnosis not present

## 2021-03-23 ENCOUNTER — Encounter (INDEPENDENT_AMBULATORY_CARE_PROVIDER_SITE_OTHER): Payer: Self-pay | Admitting: Bariatrics

## 2021-03-23 NOTE — Progress Notes (Signed)
Chief Complaint:   OBESITY Lisa Acosta is here to discuss her progress with her obesity treatment plan along with follow-up of her obesity related diagnoses. Lisa Acosta is on the Category 1 Plan and states she is following her eating plan approximately 90% of the time. Lisa Acosta states she is walking and water aerobics 80 minutes 3-4 times per week.  Today's visit was #: 4 Starting weight: 198 lbs Starting date: 01/05/2021 Today's weight: 181 lbs Today's date: 03/17/2021 Total lbs lost to date: 17 Total lbs lost since last in-office visit: 8  Interim History: Lisa Acosta is down an additional 8 lbs. She sprained her hand and was not able to drive.  Subjective:   1. Essential hypertension Lisa Acosta BP is reasonably well controlled.  BP Readings from Last 3 Encounters:  03/17/21 (!) 143/78  02/14/21 (!) 136/58  02/08/21 (!) 129/59   2. Gastroesophageal reflux disease, unspecified whether esophagitis present Lisa Acosta is taking Prilosec.  Assessment/Plan:   1. Essential hypertension Lisa Acosta is working on healthy weight loss and exercise to improve blood pressure control. We will watch for signs of hypotension as she continues her lifestyle modifications. Continue current treatment plan.  2. Gastroesophageal reflux disease, unspecified whether esophagitis present Intensive lifestyle modifications are the first line treatment for this issue. We discussed several lifestyle modifications today and she will continue to work on diet, exercise and weight loss efforts. Orders and follow up as documented in patient record. Continue current treatment plan.  Counseling If a person has gastroesophageal reflux disease (GERD), food and stomach acid move back up into the esophagus and cause symptoms or problems such as damage to the esophagus. Anti-reflux measures include: raising the head of the bed, avoiding tight clothing or belts, avoiding eating late at night, not lying down shortly after mealtime, and achieving  weight loss. Avoid ASA, NSAID's, caffeine, alcohol, and tobacco.  OTC Pepcid and/or Tums are often very helpful for as needed use.  However, for persisting chronic or daily symptoms, stronger medications like Omeprazole may be needed. You may need to avoid foods and drinks such as: Coffee and tea (with or without caffeine). Drinks that contain alcohol. Energy drinks and sports drinks. Bubbly (carbonated) drinks or sodas. Chocolate and cocoa. Peppermint and mint flavorings. Garlic and onions. Horseradish. Spicy and acidic foods. These include peppers, chili powder, curry powder, vinegar, hot sauces, and BBQ sauce. Citrus fruit juices and citrus fruits, such as oranges, lemons, and limes. Tomato-based foods. These include red sauce, chili, salsa, and pizza with red sauce. Fried and fatty foods. These include donuts, french fries, potato chips, and high-fat dressings. High-fat meats. These include hot dogs, rib eye steak, sausage, ham, and bacon.  3. Obesity, current BMI 34.3  Lisa Acosta is currently in the action stage of change. As such, her goal is to continue with weight loss efforts. She has agreed to the Category 1 Plan.   Meal plan Intentional eating  Exercise goals:  As is  Behavioral modification strategies: increasing lean protein intake, decreasing simple carbohydrates, increasing vegetables, increasing water intake, decreasing eating out, no skipping meals, meal planning and cooking strategies, keeping healthy foods in the home, and planning for success.  Lisa Acosta has agreed to follow-up with our clinic in 2 weeks. She was informed of the importance of frequent follow-up visits to maximize her success with intensive lifestyle modifications for her multiple health conditions.   Objective:   Blood pressure (!) 143/78, pulse (!) 52, temperature 99.4 F (37.4 C), height 5\' 1"  (1.549 m),  weight 181 lb (82.1 kg), SpO2 92 %. Body mass index is 34.2 kg/m.  General: Cooperative, alert,  well developed, in no acute distress. HEENT: Conjunctivae and lids unremarkable. Cardiovascular: Regular rhythm.  Lungs: Normal work of breathing. Neurologic: No focal deficits.   Lab Results  Component Value Date   CREATININE 0.81 01/24/2021   BUN 23 01/24/2021   NA 139 01/24/2021   K 4.2 01/24/2021   CL 101 01/24/2021   CO2 26 01/24/2021   Lab Results  Component Value Date   ALT 39 (H) 01/24/2021   AST 43 (H) 01/24/2021   ALKPHOS 106 01/24/2021   BILITOT 0.6 01/24/2021   Lab Results  Component Value Date   HGBA1C 6.2 (H) 01/05/2021   HGBA1C 5.9 06/23/2019   HGBA1C 5.8 04/15/2018   HGBA1C 5.8 08/13/2017   HGBA1C 6.0 01/22/2017   Lab Results  Component Value Date   INSULIN 20.4 01/05/2021   Lab Results  Component Value Date   TSH 2.10 11/03/2020   Lab Results  Component Value Date   CHOL 186 01/05/2021   HDL 67 01/05/2021   LDLCALC 95 01/05/2021   LDLDIRECT 112.0 01/22/2017   TRIG 138 01/05/2021   CHOLHDL 2.9 06/23/2020   Lab Results  Component Value Date   VD25OH 25.0 (L) 01/05/2021   Lab Results  Component Value Date   WBC 6.2 11/03/2020   HGB 13.7 11/03/2020   HCT 41.4 11/03/2020   MCV 86.7 11/03/2020   PLT 275.0 11/03/2020   No results found for: IRON, TIBC, FERRITIN  Obesity Behavioral Intervention:   Approximately 15 minutes were spent on the discussion below.  ASK: We discussed the diagnosis of obesity with Doryce today and Lisa Acosta agreed to give [place] permission to discuss obesity behavioral modification therapy today.  ASSESS: Lisa Acosta has the diagnosis of obesity and her BMI today is 34.3. Lisa Acosta is in the action stage of change.   ADVISE: Lisa Acosta was educated on the multiple health risks of obesity as well as the benefit of weight loss to improve her health. She was advised of the need for long term treatment and the importance of lifestyle modifications to improve her current health and to decrease her risk of future health  problems.  AGREE: Multiple dietary modification options and treatment options were discussed and Lisa Acosta agreed to follow the recommendations documented in the above note.  ARRANGE: Lisa Acosta was educated on the importance of frequent visits to treat obesity as outlined per CMS and USPSTF guidelines and agreed to schedule her next follow up appointment today.  Attestation Statements:   Reviewed by clinician on day of visit: allergies, medications, problem list, medical history, surgical history, family history, social history, and previous encounter notes.  Coral Ceo, CMA, am acting as Location manager for [employer], DO.  I have reviewed the above documentation for accuracy and completeness, and I agree with the above. Jearld Lesch, DO

## 2021-04-04 ENCOUNTER — Other Ambulatory Visit: Payer: Self-pay

## 2021-04-04 ENCOUNTER — Encounter (INDEPENDENT_AMBULATORY_CARE_PROVIDER_SITE_OTHER): Payer: Self-pay | Admitting: Bariatrics

## 2021-04-04 ENCOUNTER — Ambulatory Visit (INDEPENDENT_AMBULATORY_CARE_PROVIDER_SITE_OTHER): Payer: Medicare Other | Admitting: Bariatrics

## 2021-04-04 VITALS — BP 117/71 | HR 47 | Temp 99.4°F | Ht 61.0 in | Wt 178.0 lb

## 2021-04-04 DIAGNOSIS — Z6837 Body mass index (BMI) 37.0-37.9, adult: Secondary | ICD-10-CM

## 2021-04-04 DIAGNOSIS — K219 Gastro-esophageal reflux disease without esophagitis: Secondary | ICD-10-CM

## 2021-04-04 DIAGNOSIS — I10 Essential (primary) hypertension: Secondary | ICD-10-CM | POA: Diagnosis not present

## 2021-04-08 ENCOUNTER — Encounter (INDEPENDENT_AMBULATORY_CARE_PROVIDER_SITE_OTHER): Payer: Self-pay | Admitting: Bariatrics

## 2021-04-08 NOTE — Progress Notes (Signed)
Chief Complaint:   OBESITY Lisa Acosta is here to discuss her progress with her obesity treatment plan along with follow-up of her obesity related diagnoses. Lisa Acosta is on the Category 1 Plan and states she is following her eating plan approximately 85% of the time. Lisa Acosta states she is doing water aerobics 60 minutes 3 times per week.  Today's visit was #: 5 Starting weight: 198 lbs Starting date: 01/05/2021 Today's weight: 178 lbs Today's date: 04/04/2021 Total lbs lost to date: 20 lbs Total lbs lost since last in-office visit: 3 lbs  Interim History: Lisa Acosta is down an additional 3 lbs and doing well.  She states that she struggles more with the plan. She is doing more family dinners.  Subjective:   1. Essential hypertension Review: taking medications as instructed, no medication side effects noted, no chest pain on exertion, no dyspnea on exertion, no swelling of ankles. Lisa Acosta is currently taking Norvasc 5 mg daily.  BP Readings from Last 3 Encounters:  04/04/21 117/71  03/17/21 (!) 143/78  02/14/21 (!) 136/58   2. Gastroesophageal reflux disease, unspecified whether esophagitis present Lisa Acosta is currently taking Prilosec.  Assessment/Plan:   1. Essential hypertension Lisa Acosta is working on healthy weight loss and exercise to improve blood pressure control. We will watch for signs of hypotension as she continues her lifestyle modifications. Gennett will continue with her medication.  2. Gastroesophageal reflux disease, unspecified whether esophagitis present Intensive lifestyle modifications are the first line treatment for this issue. We discussed several lifestyle modifications today and she will continue to work on diet, exercise and weight loss efforts. Orders and follow up as documented in patient record. Lisa Acosta with continue taking Prilosec.  Counseling If a person has gastroesophageal reflux disease (GERD), food and stomach acid move back up into the esophagus and cause symptoms  or problems such as damage to the esophagus. Anti-reflux measures include: raising the head of the bed, avoiding tight clothing or belts, avoiding eating late at night, not lying down shortly after mealtime, and achieving weight loss. Avoid ASA, NSAID's, caffeine, alcohol, and tobacco.  OTC Pepcid and/or Tums are often very helpful for as needed use.  However, for persisting chronic or daily symptoms, stronger medications like Omeprazole may be needed. You may need to avoid foods and drinks such as: Coffee and tea (with or without caffeine). Drinks that contain alcohol. Energy drinks and sports drinks. Bubbly (carbonated) drinks or sodas. Chocolate and cocoa. Peppermint and mint flavorings. Garlic and onions. Horseradish. Spicy and acidic foods. These include peppers, chili powder, curry powder, vinegar, hot sauces, and BBQ sauce. Citrus fruit juices and citrus fruits, such as oranges, lemons, and limes. Tomato-based foods. These include red sauce, chili, salsa, and pizza with red sauce. Fried and fatty foods. These include donuts, french fries, potato chips, and high-fat dressings. High-fat meats. These include hot dogs, rib eye steak, sausage, ham, and bacon.   3. Obesity, current BMI 33.8 Lisa Acosta is currently in the action stage of change. As such, her goal is to continue with weight loss efforts. She has agreed to the Category 1 Plan.   Exercise goals: Continue going to the pool for aerobic classes.  Behavioral modification strategies: increasing lean protein intake, decreasing simple carbohydrates, increasing vegetables, increasing water intake, decreasing eating out, no skipping meals, meal planning and cooking strategies, keeping healthy foods in the home, and planning for success.  Lisa Acosta has agreed to follow-up with our clinic in 2 weeks. She was informed of the importance of  frequent follow-up visits to maximize her success with intensive lifestyle modifications for her multiple  health conditions.   Objective:   Blood pressure 117/71, pulse (!) 47, temperature 99.4 F (37.4 C), height '5\' 1"'$  (1.549 m), weight 178 lb (80.7 kg), SpO2 95 %. Body mass index is 33.63 kg/m.  General: Cooperative, alert, well developed, in no acute distress. HEENT: Conjunctivae and lids unremarkable. Cardiovascular: Regular rhythm.  Lungs: Normal work of breathing. Neurologic: No focal deficits.   Lab Results  Component Value Date   CREATININE 0.81 01/24/2021   BUN 23 01/24/2021   NA 139 01/24/2021   K 4.2 01/24/2021   CL 101 01/24/2021   CO2 26 01/24/2021   Lab Results  Component Value Date   ALT 39 (H) 01/24/2021   AST 43 (H) 01/24/2021   ALKPHOS 106 01/24/2021   BILITOT 0.6 01/24/2021   Lab Results  Component Value Date   HGBA1C 6.2 (H) 01/05/2021   HGBA1C 5.9 06/23/2019   HGBA1C 5.8 04/15/2018   HGBA1C 5.8 08/13/2017   HGBA1C 6.0 01/22/2017   Lab Results  Component Value Date   INSULIN 20.4 01/05/2021   Lab Results  Component Value Date   TSH 2.10 11/03/2020   Lab Results  Component Value Date   CHOL 186 01/05/2021   HDL 67 01/05/2021   LDLCALC 95 01/05/2021   LDLDIRECT 112.0 01/22/2017   TRIG 138 01/05/2021   CHOLHDL 2.9 06/23/2020   Lab Results  Component Value Date   VD25OH 25.0 (L) 01/05/2021   Lab Results  Component Value Date   WBC 6.2 11/03/2020   HGB 13.7 11/03/2020   HCT 41.4 11/03/2020   MCV 86.7 11/03/2020   PLT 275.0 11/03/2020   No results found for: IRON, TIBC, FERRITIN  Obesity Behavioral Intervention:   Approximately 15 minutes were spent on the discussion below.  ASK: We discussed the diagnosis of obesity with Lisa Acosta today and Lisa Acosta agreed to give [place] permission to discuss obesity behavioral modification therapy today.  ASSESS: Lisa Acosta has the diagnosis of obesity and her BMI today is 33.8. Lisa Acosta is in the action stage of change.   ADVISE: Lisa Acosta was educated on the multiple health risks of obesity as well as the  benefit of weight loss to improve her health. She was advised of the need for long term treatment and the importance of lifestyle modifications to improve her current health and to decrease her risk of future health problems.  AGREE: Multiple dietary modification options and treatment options were discussed and Lisa Acosta agreed to follow the recommendations documented in the above note.  ARRANGE: Lisa Acosta was educated on the importance of frequent visits to treat obesity as outlined per CMS and USPSTF guidelines and agreed to schedule her next follow up appointment today.  Attestation Statements:   Reviewed by clinician on day of visit: allergies, medications, problem list, medical history, surgical history, family history, social history, and previous encounter notes.  ILennette Bihari, CMA, am acting as transcriptionist for Dr. Jearld Lesch, DO.  I have reviewed the above documentation for accuracy and completeness, and I agree with the above. Jearld Lesch, DO

## 2021-04-12 DIAGNOSIS — F321 Major depressive disorder, single episode, moderate: Secondary | ICD-10-CM | POA: Diagnosis not present

## 2021-04-14 ENCOUNTER — Encounter: Payer: Self-pay | Admitting: Family Medicine

## 2021-04-14 ENCOUNTER — Other Ambulatory Visit: Payer: Self-pay | Admitting: Family Medicine

## 2021-04-14 DIAGNOSIS — F320 Major depressive disorder, single episode, mild: Secondary | ICD-10-CM

## 2021-04-14 MED ORDER — FLUOXETINE HCL 40 MG PO CAPS: 40.0000 mg | ORAL_CAPSULE | Freq: Two times a day (BID) | ORAL | 3 refills | Status: DC

## 2021-04-18 DIAGNOSIS — I480 Paroxysmal atrial fibrillation: Secondary | ICD-10-CM | POA: Diagnosis not present

## 2021-04-18 DIAGNOSIS — I700 Atherosclerosis of aorta: Secondary | ICD-10-CM | POA: Diagnosis not present

## 2021-04-18 DIAGNOSIS — Z5181 Encounter for therapeutic drug level monitoring: Secondary | ICD-10-CM | POA: Diagnosis not present

## 2021-04-18 DIAGNOSIS — Z79899 Other long term (current) drug therapy: Secondary | ICD-10-CM | POA: Diagnosis not present

## 2021-04-19 ENCOUNTER — Ambulatory Visit (INDEPENDENT_AMBULATORY_CARE_PROVIDER_SITE_OTHER): Payer: Medicare Other | Admitting: Bariatrics

## 2021-04-19 ENCOUNTER — Encounter (INDEPENDENT_AMBULATORY_CARE_PROVIDER_SITE_OTHER): Payer: Self-pay | Admitting: Bariatrics

## 2021-04-19 ENCOUNTER — Other Ambulatory Visit: Payer: Self-pay

## 2021-04-19 VITALS — BP 107/63 | HR 50 | Temp 99.0°F | Ht 61.0 in | Wt 176.0 lb

## 2021-04-19 DIAGNOSIS — Z6837 Body mass index (BMI) 37.0-37.9, adult: Secondary | ICD-10-CM | POA: Diagnosis not present

## 2021-04-19 DIAGNOSIS — K219 Gastro-esophageal reflux disease without esophagitis: Secondary | ICD-10-CM

## 2021-04-19 DIAGNOSIS — I10 Essential (primary) hypertension: Secondary | ICD-10-CM

## 2021-04-19 NOTE — Patient Instructions (Signed)
Health Maintenance Due  Topic Date Due   Zoster Vaccines- Shingrix (1 of 2) Never done   COVID-19 Vaccine (4 - Booster for Pfizer series) 09/17/2020   INFLUENZA VACCINE  04/11/2021    Depression screen [facility] 2/9 01/24/2021 01/05/2021 12/22/2020  Decreased Interest 0 1 0  Down, Depressed, Hopeless 0 2 0  PHQ - 2 Score 0 3 0  Altered sleeping - 0 0  Tired, decreased energy - 1 0  Change in appetite - 2 0  Feeling bad or failure about yourself  - 1 0  Trouble concentrating - 1 0  Moving slowly or fidgety/restless - 0 0  Suicidal thoughts - 0 0  PHQ-9 Score - 8 0  Difficult doing work/chores - Not difficult at all Not difficult at all  Some recent data might be hidden

## 2021-04-20 ENCOUNTER — Encounter (INDEPENDENT_AMBULATORY_CARE_PROVIDER_SITE_OTHER): Payer: Self-pay | Admitting: Bariatrics

## 2021-04-20 DIAGNOSIS — H348312 Tributary (branch) retinal vein occlusion, right eye, stable: Secondary | ICD-10-CM | POA: Diagnosis not present

## 2021-04-20 DIAGNOSIS — H53411 Scotoma involving central area, right eye: Secondary | ICD-10-CM | POA: Diagnosis not present

## 2021-04-20 DIAGNOSIS — H04123 Dry eye syndrome of bilateral lacrimal glands: Secondary | ICD-10-CM | POA: Diagnosis not present

## 2021-04-20 DIAGNOSIS — Z961 Presence of intraocular lens: Secondary | ICD-10-CM | POA: Diagnosis not present

## 2021-04-20 NOTE — Progress Notes (Signed)
Chief Complaint:   OBESITY Kiona is here to discuss her progress with her obesity treatment plan along with follow-up of her obesity related diagnoses. Brayleigh is on the Category 1 Plan and states she is following her eating plan approximately 85% of the time. Nene states she is doing water aerobics for 60 minutes 3 times per week.  Today's visit was #: 6 Starting weight: 198 lbs Starting date: 01/05/2021 Today's weight: 176 lbs Today's date: 04/19/2021 Total lbs lost to date: 22 lbs Total lbs lost since last in-office visit: 2 lbs  Interim History: Aylinn is down an additional 2 lbs since her last visit. She has had several momentous occasions where she celebrated with food.   Subjective:   1. Gastroesophageal reflux disease, unspecified whether esophagitis present Correen is taking Prilosec.  2. Essential hypertension Keiley's hypertension is controlled. Review: taking medications as instructed, no medication side effects noted, no chest pain on exertion, no dyspnea on exertion, no swelling of ankles.   Assessment/Plan:   1. Gastroesophageal reflux disease, unspecified whether esophagitis present Brightyn will continue taking Prilosec. She will monitor triggers. Intensive lifestyle modifications are the first line treatment for this issue. We discussed several lifestyle modifications today and she will continue to work on diet, exercise and weight loss efforts. Orders and follow up as documented in patient record.   Counseling If a person has gastroesophageal reflux disease (GERD), food and stomach acid move back up into the esophagus and cause symptoms or problems such as damage to the esophagus. Anti-reflux measures include: raising the head of the bed, avoiding tight clothing or belts, avoiding eating late at night, not lying down shortly after mealtime, and achieving weight loss. Avoid ASA, NSAID's, caffeine, alcohol, and tobacco.  OTC Pepcid and/or Tums are often very helpful for  as needed use.  However, for persisting chronic or daily symptoms, stronger medications like Omeprazole may be needed. You may need to avoid foods and drinks such as: Coffee and tea (with or without caffeine). Drinks that contain alcohol. Energy drinks and sports drinks. Bubbly (carbonated) drinks or sodas. Chocolate and cocoa. Peppermint and mint flavorings. Garlic and onions. Horseradish. Spicy and acidic foods. These include peppers, chili powder, curry powder, vinegar, hot sauces, and BBQ sauce. Citrus fruit juices and citrus fruits, such as oranges, lemons, and limes. Tomato-based foods. These include red sauce, chili, salsa, and pizza with red sauce. Fried and fatty foods. These include donuts, french fries, potato chips, and high-fat dressings. High-fat meats. These include hot dogs, rib eye steak, sausage, ham, and bacon.   2. Essential hypertension Breta will continue medications. She is working on healthy weight loss and exercise to improve blood pressure control. We will watch for signs of hypotension as she continues her lifestyle modifications.   3. Obesity, current BMI 33.3 Cadience is currently in the action stage of change. As such, her goal is to continue with weight loss efforts. She has agreed to the Category 1 Plan.   Adeli will continue meal planning. She will continue intentional eating. She will manage between calories. She will balance protein, fruits and vegetables.  Exercise goals:  Karlen will continue water aerobics and will continue 3-4 times weekly.  Behavioral modification strategies: increasing lean protein intake, decreasing simple carbohydrates, increasing vegetables, increasing water intake, decreasing eating out, no skipping meals, meal planning and cooking strategies, keeping healthy foods in the home, and planning for success.  Jashanti has agreed to follow-up with our clinic in 2 weeks. She  was informed of the importance of frequent follow-up visits to  maximize her success with intensive lifestyle modifications for her multiple health conditions.   Objective:   Blood pressure 107/63, pulse (!) 50, temperature 99 F (37.2 C), height '5\' 1"'$  (1.549 m), weight 176 lb (79.8 kg), SpO2 95 %. Body mass index is 33.25 kg/m. Courtnei is using the 4 point cane for ambulation.  General: Cooperative, alert, well developed, in no acute distress. HEENT: Conjunctivae and lids unremarkable. Cardiovascular: Regular rhythm.  Lungs: Normal work of breathing. Neurologic: No focal deficits.   Lab Results  Component Value Date   CREATININE 0.81 01/24/2021   BUN 23 01/24/2021   NA 139 01/24/2021   K 4.2 01/24/2021   CL 101 01/24/2021   CO2 26 01/24/2021   Lab Results  Component Value Date   ALT 39 (H) 01/24/2021   AST 43 (H) 01/24/2021   ALKPHOS 106 01/24/2021   BILITOT 0.6 01/24/2021   Lab Results  Component Value Date   HGBA1C 6.2 (H) 01/05/2021   HGBA1C 5.9 06/23/2019   HGBA1C 5.8 04/15/2018   HGBA1C 5.8 08/13/2017   HGBA1C 6.0 01/22/2017   Lab Results  Component Value Date   INSULIN 20.4 01/05/2021   Lab Results  Component Value Date   TSH 2.10 11/03/2020   Lab Results  Component Value Date   CHOL 186 01/05/2021   HDL 67 01/05/2021   LDLCALC 95 01/05/2021   LDLDIRECT 112.0 01/22/2017   TRIG 138 01/05/2021   CHOLHDL 2.9 06/23/2020   Lab Results  Component Value Date   VD25OH 25.0 (L) 01/05/2021   Lab Results  Component Value Date   WBC 6.2 11/03/2020   HGB 13.7 11/03/2020   HCT 41.4 11/03/2020   MCV 86.7 11/03/2020   PLT 275.0 11/03/2020   No results found for: IRON, TIBC, FERRITIN  Obesity Behavioral Intervention:   Approximately 15 minutes were spent on the discussion below.  ASK: We discussed the diagnosis of obesity with Calina today and Breeley agreed to give [place] permission to discuss obesity behavioral modification therapy today.  ASSESS: Rhianna has the diagnosis of obesity and her BMI today is 33.3. Shruthi is  in the action stage of change.   ADVISE: Hilal was educated on the multiple health risks of obesity as well as the benefit of weight loss to improve her health. She was advised of the need for long term treatment and the importance of lifestyle modifications to improve her current health and to decrease her risk of future health problems.  AGREE: Multiple dietary modification options and treatment options were discussed and Bellany agreed to follow the recommendations documented in the above note.  ARRANGE: Hildur was educated on the importance of frequent visits to treat obesity as outlined per CMS and USPSTF guidelines and agreed to schedule her next follow up appointment today.  Attestation Statements:   Reviewed by clinician on day of visit: allergies, medications, problem list, medical history, surgical history, family history, social history, and previous encounter notes.  I, Lizbeth Bark, RMA, am acting as Location manager for [employer], DO.   I have reviewed the above documentation for accuracy and completeness, and I agree with the above. Jearld Lesch, DO

## 2021-04-26 DIAGNOSIS — Z7409 Other reduced mobility: Secondary | ICD-10-CM | POA: Diagnosis not present

## 2021-04-26 DIAGNOSIS — M5136 Other intervertebral disc degeneration, lumbar region: Secondary | ICD-10-CM | POA: Diagnosis not present

## 2021-04-29 DIAGNOSIS — Z7409 Other reduced mobility: Secondary | ICD-10-CM | POA: Diagnosis not present

## 2021-04-29 DIAGNOSIS — R269 Unspecified abnormalities of gait and mobility: Secondary | ICD-10-CM | POA: Diagnosis not present

## 2021-04-29 DIAGNOSIS — M5136 Other intervertebral disc degeneration, lumbar region: Secondary | ICD-10-CM | POA: Diagnosis not present

## 2021-05-04 ENCOUNTER — Encounter (INDEPENDENT_AMBULATORY_CARE_PROVIDER_SITE_OTHER): Payer: Self-pay | Admitting: Bariatrics

## 2021-05-04 ENCOUNTER — Ambulatory Visit (INDEPENDENT_AMBULATORY_CARE_PROVIDER_SITE_OTHER): Payer: Medicare Other | Admitting: Bariatrics

## 2021-05-04 ENCOUNTER — Other Ambulatory Visit: Payer: Self-pay

## 2021-05-04 VITALS — BP 111/73 | HR 50 | Temp 98.5°F | Ht 61.0 in | Wt 174.0 lb

## 2021-05-04 DIAGNOSIS — Z6837 Body mass index (BMI) 37.0-37.9, adult: Secondary | ICD-10-CM

## 2021-05-04 DIAGNOSIS — K219 Gastro-esophageal reflux disease without esophagitis: Secondary | ICD-10-CM

## 2021-05-04 DIAGNOSIS — I10 Essential (primary) hypertension: Secondary | ICD-10-CM

## 2021-05-04 DIAGNOSIS — F321 Major depressive disorder, single episode, moderate: Secondary | ICD-10-CM | POA: Diagnosis not present

## 2021-05-04 NOTE — Progress Notes (Signed)
Chief Complaint:   OBESITY Lisa Acosta is here to discuss her progress with her obesity treatment plan along with follow-up of her obesity related diagnoses. Lisa Acosta is on the Category 1 Plan and states she is following her eating plan approximately 75% of the time. Lisa Acosta states she is doing water aerobics for 60 minutes 3 times per week and physical therapy for 45 minutes 2 times per week.  Today's visit was #: 7 Starting weight: 198 lbs Starting date: 01/02/2021 Today's weight: 174 lbs Today's date: 05/04/2021 Total lbs lost to date: 24 lbs Total lbs lost since last in-office visit: 2 lbs  Interim History: Lisa Acosta is down an additional 2 lbs since her last visit. She has attended multiple celebrations. She is working on protein.  Subjective:   1. Essential hypertension Lisa Acosta's hypertension is controlled on Norvasc and Tenormin.  2. Gastroesophageal reflux disease, unspecified whether esophagitis present Lisa Acosta is taking Prilosec.   Assessment/Plan:   1. Essential hypertension Lisa Acosta will continue on medications. She is working on healthy weight loss and exercise to improve blood pressure control. We will watch for signs of hypotension as she continues her lifestyle modifications.   2. Gastroesophageal reflux disease, unspecified whether esophagitis present Intensive lifestyle modifications are the first line treatment for this issue. Lisa Acosta will continue Prilosec. She will avoid trigger foods.We discussed several lifestyle modifications today and she will continue to work on diet, exercise and weight loss efforts. Orders and follow up as documented in patient record.   Counseling If a person has gastroesophageal reflux disease (GERD), food and stomach acid move back up into the esophagus and cause symptoms or problems such as damage to the esophagus. Anti-reflux measures include: raising the head of the bed, avoiding tight clothing or belts, avoiding eating late at night, not lying down  shortly after mealtime, and achieving weight loss. Avoid ASA, NSAID's, caffeine, alcohol, and tobacco.  OTC Pepcid and/or Tums are often very helpful for as needed use.  However, for persisting chronic or daily symptoms, stronger medications like Omeprazole may be needed. You may need to avoid foods and drinks such as: Coffee and tea (with or without caffeine). Drinks that contain alcohol. Energy drinks and sports drinks. Bubbly (carbonated) drinks or sodas. Chocolate and cocoa. Peppermint and mint flavorings. Garlic and onions. Horseradish. Spicy and acidic foods. These include peppers, chili powder, curry powder, vinegar, hot sauces, and BBQ sauce. Citrus fruit juices and citrus fruits, such as oranges, lemons, and limes. Tomato-based foods. These include red sauce, chili, salsa, and pizza with red sauce. Fried and fatty foods. These include donuts, french fries, potato chips, and high-fat dressings. High-fat meats. These include hot dogs, rib eye steak, sausage, ham, and bacon.   3. Obesity, current BMI 33 Lisa Acosta is currently in the action stage of change. As such, her goal is to continue with weight loss efforts. She has agreed to the Category 1 Plan.   Lisa Acosta will continue meal planning. She will continue intentional eating. High Protein Food Choices. She will increase H2O.  Exercise goals: No exercise has been prescribed at this time.  Behavioral modification strategies: increasing lean protein intake, decreasing simple carbohydrates, increasing vegetables, increasing water intake, decreasing eating out, no skipping meals, meal planning and cooking strategies, keeping healthy foods in the home, and planning for success.  Lisa Acosta has agreed to follow-up with our clinic in 2-3 weeks(fasting). She was informed of the importance of frequent follow-up visits to maximize her success with intensive lifestyle modifications for her  multiple health conditions.   Objective:   Blood pressure  111/73, pulse (!) 50, temperature 98.5 F (36.9 C), height '5\' 1"'$  (1.549 m), weight 174 lb (78.9 kg), SpO2 94 %. Body mass index is 32.88 kg/m. Lisa Acosta is walking with a 4 point cane.  General: Cooperative, alert, well developed, in no acute distress. HEENT: Conjunctivae and lids unremarkable. Cardiovascular: Regular rhythm.  Lungs: Normal work of breathing. Neurologic: No focal deficits.   Lab Results  Component Value Date   CREATININE 0.81 01/24/2021   BUN 23 01/24/2021   NA 139 01/24/2021   K 4.2 01/24/2021   CL 101 01/24/2021   CO2 26 01/24/2021   Lab Results  Component Value Date   ALT 39 (H) 01/24/2021   AST 43 (H) 01/24/2021   ALKPHOS 106 01/24/2021   BILITOT 0.6 01/24/2021   Lab Results  Component Value Date   HGBA1C 6.2 (H) 01/05/2021   HGBA1C 5.9 06/23/2019   HGBA1C 5.8 04/15/2018   HGBA1C 5.8 08/13/2017   HGBA1C 6.0 01/22/2017   Lab Results  Component Value Date   INSULIN 20.4 01/05/2021   Lab Results  Component Value Date   TSH 2.10 11/03/2020   Lab Results  Component Value Date   CHOL 186 01/05/2021   HDL 67 01/05/2021   LDLCALC 95 01/05/2021   LDLDIRECT 112.0 01/22/2017   TRIG 138 01/05/2021   CHOLHDL 2.9 06/23/2020   Lab Results  Component Value Date   VD25OH 25.0 (L) 01/05/2021   Lab Results  Component Value Date   WBC 6.2 11/03/2020   HGB 13.7 11/03/2020   HCT 41.4 11/03/2020   MCV 86.7 11/03/2020   PLT 275.0 11/03/2020   No results found for: IRON, TIBC, FERRITIN  Obesity Behavioral Intervention:   Approximately 15 minutes were spent on the discussion below.  ASK: We discussed the diagnosis of obesity with Lisa Acosta today and Lisa Acosta agreed to give [place] permission to discuss obesity behavioral modification therapy today.  ASSESS: Lisa Acosta has the diagnosis of obesity and her BMI today is 33.0. Lisa Acosta is in the action stage of change.   ADVISE: Lisa Acosta was educated on the multiple health risks of obesity as well as the benefit of weight  loss to improve her health. She was advised of the need for long term treatment and the importance of lifestyle modifications to improve her current health and to decrease her risk of future health problems.  AGREE: Multiple dietary modification options and treatment options were discussed and Lisa Acosta agreed to follow the recommendations documented in the above note.  ARRANGE: Lisa Acosta was educated on the importance of frequent visits to treat obesity as outlined per CMS and USPSTF guidelines and agreed to schedule her next follow up appointment today.  Attestation Statements:   Reviewed by clinician on day of visit: allergies, medications, problem list, medical history, surgical history, family history, social history, and previous encounter notes.  I, Lizbeth Bark, RMA, am acting as Location manager for [employer], DO.   I have reviewed the above documentation for accuracy and completeness, and I agree with the above. Jearld Lesch, DO

## 2021-05-06 ENCOUNTER — Encounter (INDEPENDENT_AMBULATORY_CARE_PROVIDER_SITE_OTHER): Payer: Self-pay | Admitting: Bariatrics

## 2021-05-18 DIAGNOSIS — Z7409 Other reduced mobility: Secondary | ICD-10-CM | POA: Diagnosis not present

## 2021-05-18 DIAGNOSIS — M5136 Other intervertebral disc degeneration, lumbar region: Secondary | ICD-10-CM | POA: Diagnosis not present

## 2021-05-20 DIAGNOSIS — Z7409 Other reduced mobility: Secondary | ICD-10-CM | POA: Diagnosis not present

## 2021-05-20 DIAGNOSIS — M5136 Other intervertebral disc degeneration, lumbar region: Secondary | ICD-10-CM | POA: Diagnosis not present

## 2021-05-20 DIAGNOSIS — M545 Low back pain, unspecified: Secondary | ICD-10-CM | POA: Diagnosis not present

## 2021-05-24 DIAGNOSIS — M5136 Other intervertebral disc degeneration, lumbar region: Secondary | ICD-10-CM | POA: Diagnosis not present

## 2021-05-24 DIAGNOSIS — Z7409 Other reduced mobility: Secondary | ICD-10-CM | POA: Diagnosis not present

## 2021-05-24 DIAGNOSIS — F321 Major depressive disorder, single episode, moderate: Secondary | ICD-10-CM | POA: Diagnosis not present

## 2021-05-24 DIAGNOSIS — M545 Low back pain, unspecified: Secondary | ICD-10-CM | POA: Diagnosis not present

## 2021-05-26 ENCOUNTER — Ambulatory Visit (INDEPENDENT_AMBULATORY_CARE_PROVIDER_SITE_OTHER): Payer: Medicare Other | Admitting: Bariatrics

## 2021-05-26 ENCOUNTER — Encounter (INDEPENDENT_AMBULATORY_CARE_PROVIDER_SITE_OTHER): Payer: Self-pay | Admitting: Bariatrics

## 2021-05-26 ENCOUNTER — Other Ambulatory Visit: Payer: Self-pay

## 2021-05-26 VITALS — BP 122/77 | HR 50 | Temp 98.2°F | Ht 61.0 in | Wt 170.0 lb

## 2021-05-26 DIAGNOSIS — K219 Gastro-esophageal reflux disease without esophagitis: Secondary | ICD-10-CM | POA: Diagnosis not present

## 2021-05-26 DIAGNOSIS — I10 Essential (primary) hypertension: Secondary | ICD-10-CM | POA: Diagnosis not present

## 2021-05-26 DIAGNOSIS — Z6837 Body mass index (BMI) 37.0-37.9, adult: Secondary | ICD-10-CM | POA: Diagnosis not present

## 2021-05-30 DIAGNOSIS — Z7409 Other reduced mobility: Secondary | ICD-10-CM | POA: Diagnosis not present

## 2021-05-30 DIAGNOSIS — M5136 Other intervertebral disc degeneration, lumbar region: Secondary | ICD-10-CM | POA: Diagnosis not present

## 2021-05-30 DIAGNOSIS — M545 Low back pain, unspecified: Secondary | ICD-10-CM | POA: Diagnosis not present

## 2021-05-30 NOTE — Progress Notes (Signed)
Chief Complaint:   OBESITY Lisa Acosta is here to discuss her progress with her obesity treatment plan along with follow-up of her obesity related diagnoses. Lisa Acosta is on the Category 1 Plan and states she is following her eating plan approximately 90% of the time. Lisa Acosta states she is doing water aerobics for 60 minutes 4 times per week.  Today's visit was #: 8 Starting weight: 198 lbs Starting date: 01/02/2021 Today's weight: 170 lbs Today's date: 05/26/2021 Total lbs lost to date: 28 lbs Total lbs lost since last in-office visit: 4 lbs  Interim History: Lisa Acosta is down 4 lbs and doing well. She is having problems eating food overall. She is still struggling with H2O at times.  Subjective:   1. Gastroesophageal reflux disease, unspecified whether esophagitis present Lisa Acosta is currently taking Prilosec. Her hypertension is controlled.  2. Essential hypertension Lisa Acosta is currently taking Norvasc and Tenormin.   Assessment/Plan:   1. Gastroesophageal reflux disease, unspecified whether esophagitis present Intensive lifestyle modifications are the first line treatment for this issue. We discussed several lifestyle modifications today and she will continue to work on diet, exercise and weight loss efforts. Lisa Acosta will continue medications. Orders and follow up as documented in patient record.   Counseling If a person has gastroesophageal reflux disease (GERD), food and stomach acid move back up into the esophagus and cause symptoms or problems such as damage to the esophagus. Anti-reflux measures include: raising the head of the bed, avoiding tight clothing or belts, avoiding eating late at night, not lying down shortly after mealtime, and achieving weight loss. Avoid ASA, NSAID's, caffeine, alcohol, and tobacco.  OTC Pepcid and/or Tums are often very helpful for as needed use.  However, for persisting chronic or daily symptoms, stronger medications like Omeprazole may be needed. You may  need to avoid foods and drinks such as: Coffee and tea (with or without caffeine). Drinks that contain alcohol. Energy drinks and sports drinks. Bubbly (carbonated) drinks or sodas. Chocolate and cocoa. Peppermint and mint flavorings. Garlic and onions. Horseradish. Spicy and acidic foods. These include peppers, chili powder, curry powder, vinegar, hot sauces, and BBQ sauce. Citrus fruit juices and citrus fruits, such as oranges, lemons, and limes. Tomato-based foods. These include red sauce, chili, salsa, and pizza with red sauce. Fried and fatty foods. These include donuts, french fries, potato chips, and high-fat dressings. High-fat meats. These include hot dogs, rib eye steak, sausage, ham, and bacon.   2. Essential hypertension Lisa Acosta will continue medications. She is working on healthy weight loss and exercise to improve blood pressure control. We will watch for signs of hypotension as she continues her lifestyle modifications.  3. Obesity, current BMI 32.1 Lisa Acosta is currently in the action stage of change. As such, her goal is to continue with weight loss efforts. She has agreed to the Category 1 Plan.   Lisa Acosta will continue meal eating and intentional eating. She will increase H2O.  Exercise goals:  Lisa Acosta will continue water aerobics 60 minutes 4 times per week and walking.  Behavioral modification strategies: increasing lean protein intake, decreasing simple carbohydrates, increasing vegetables, increasing water intake, decreasing eating out, no skipping meals, meal planning and cooking strategies, keeping healthy foods in the home, and planning for success.  Lisa Acosta has agreed to follow-up with our clinic in 3-4 weeks. She was informed of the importance of frequent follow-up visits to maximize her success with intensive lifestyle modifications for her multiple health conditions.   Objective:   Blood pressure  122/77, pulse (!) 50, temperature 98.2 F (36.8 C), height '5\' 1"'$  (1.549  m), weight 170 lb (77.1 kg), SpO2 93 %. Body mass index is 32.12 kg/m. Lisa Acosta is using a 4 point cane for ambulation.  General: Cooperative, alert, well developed, in no acute distress. HEENT: Conjunctivae and lids unremarkable. Cardiovascular: Regular rhythm.  Lungs: Normal work of breathing. Neurologic: No focal deficits.   Lab Results  Component Value Date   CREATININE 0.81 01/24/2021   BUN 23 01/24/2021   NA 139 01/24/2021   K 4.2 01/24/2021   CL 101 01/24/2021   CO2 26 01/24/2021   Lab Results  Component Value Date   ALT 39 (H) 01/24/2021   AST 43 (H) 01/24/2021   ALKPHOS 106 01/24/2021   BILITOT 0.6 01/24/2021   Lab Results  Component Value Date   HGBA1C 6.2 (H) 01/05/2021   HGBA1C 5.9 06/23/2019   HGBA1C 5.8 04/15/2018   HGBA1C 5.8 08/13/2017   HGBA1C 6.0 01/22/2017   Lab Results  Component Value Date   INSULIN 20.4 01/05/2021   Lab Results  Component Value Date   TSH 2.10 11/03/2020   Lab Results  Component Value Date   CHOL 186 01/05/2021   HDL 67 01/05/2021   LDLCALC 95 01/05/2021   LDLDIRECT 112.0 01/22/2017   TRIG 138 01/05/2021   CHOLHDL 2.9 06/23/2020   Lab Results  Component Value Date   VD25OH 25.0 (L) 01/05/2021   Lab Results  Component Value Date   WBC 6.2 11/03/2020   HGB 13.7 11/03/2020   HCT 41.4 11/03/2020   MCV 86.7 11/03/2020   PLT 275.0 11/03/2020   No results found for: IRON, TIBC, FERRITIN  Obesity Behavioral Intervention:   Approximately 15 minutes were spent on the discussion below.  ASK: We discussed the diagnosis of obesity with Lisa Acosta today and Lisa Acosta agreed to give [place] permission to discuss obesity behavioral modification therapy today.  ASSESS: Lisa Acosta has the diagnosis of obesity and her BMI today is 32.1. Lisa Acosta is in the action stage of change.   ADVISE: Lisa Acosta was educated on the multiple health risks of obesity as well as the benefit of weight loss to improve her health. She was advised of the need for long  term treatment and the importance of lifestyle modifications to improve her current health and to decrease her risk of future health problems.  AGREE: Multiple dietary modification options and treatment options were discussed and Lisa Acosta agreed to follow the recommendations documented in the above note.  ARRANGE: Lisa Acosta was educated on the importance of frequent visits to treat obesity as outlined per CMS and USPSTF guidelines and agreed to schedule her next follow up appointment today.  Attestation Statements:   Reviewed by clinician on day of visit: allergies, medications, problem list, medical history, surgical history, family history, social history, and previous encounter notes.  I, Lizbeth Bark, RMA, am acting as Location manager for [employer], DO.   I have reviewed the above documentation for accuracy and completeness, and I agree with the above. Jearld Lesch, DO

## 2021-06-07 DIAGNOSIS — M545 Low back pain, unspecified: Secondary | ICD-10-CM | POA: Diagnosis not present

## 2021-06-07 DIAGNOSIS — M5136 Other intervertebral disc degeneration, lumbar region: Secondary | ICD-10-CM | POA: Diagnosis not present

## 2021-06-07 DIAGNOSIS — Z7409 Other reduced mobility: Secondary | ICD-10-CM | POA: Diagnosis not present

## 2021-06-14 DIAGNOSIS — Z7409 Other reduced mobility: Secondary | ICD-10-CM | POA: Diagnosis not present

## 2021-06-14 DIAGNOSIS — M5136 Other intervertebral disc degeneration, lumbar region: Secondary | ICD-10-CM | POA: Diagnosis not present

## 2021-06-21 ENCOUNTER — Ambulatory Visit (INDEPENDENT_AMBULATORY_CARE_PROVIDER_SITE_OTHER): Payer: Medicare Other | Admitting: Bariatrics

## 2021-06-23 DIAGNOSIS — F320 Major depressive disorder, single episode, mild: Secondary | ICD-10-CM | POA: Diagnosis not present

## 2021-06-27 NOTE — Progress Notes (Addendum)
[place] [facility] [address], [place], [place] [address] [phone]  Date:  06/30/2021   Name:  Lisa Acosta   DOB:  1951-02-16   MRN:  [id number]  PCP:  Darreld Mclean, MD    Chief Complaint: Follow-up (Concerns/ questions: yes/Flu shot today: yes/)   History of Present Illness:  Lisa Acosta is a 70 y.o. very pleasant female patient who presents with the following:  Pt seen today for follow-up  Last seen by myself in May History of depression, atrial fibrillation, osteoarthritis and osteopenia-however most recent bone density upgraded diagnosis to osteoporosis, hypothyroidism, hyperlipidemia  She is going to the weight [facility] wellness center and has lost about 30- 35 lbs total.  She is walking up to a mile at a time and doing water aerobics  She notes a long term issue with constipation. She has tried miralax daily.  It is working for her- she wonders if this is ok to do, I advised her this is ok as long as it's working and she is up-to-date on her colon cancer screening  She is drinking a lot of water- this is getting kind of boring for her. She wonders about plain black tea- not sweet.  I advised this is likely ok within reason  She is worried about her memory - she is seeing neurology with Dr Geralynn Ochs in December  2) Memory impairment. Chronic. 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 We reviewed diagnostic considerations, treatment concepts and options. Question previous suggestion for possible REM sleep behavior disorder which may increase risk for diffuse Lewy body dementia. Encouraged continued efforts at salubrious behaviors, continued aggressive management of depression. Presently linked with psychiatry and counseling now  She is doing some counseling- for depression sx She feels like her depression is much better overall.  However, she  continues to feel like her memory is getting worse.  She has a poor sense of direction and has to use GPS a lot of the time.  She knows she may lose her train of thought.  Her family and friends have also noticed the symptoms She is mostly staying in [place] right now- she will take a 1/2 atenolol if she goes into fib and it resolves   Fosmax Amloidpine Atenolol  Flecainide Prozac Synthroid  Xarelto  Crestor Trazodone  Labs can be updated  Shingrix Covid booster Flu vaccine- done today  Mammo UTD Dexa 4/22 Colon is UTD  Wt Readings from Last 3 Encounters:  06/30/21 168 lb (76.2 kg)  05/26/21 170 lb (77.1 kg)  05/04/21 174 lb (78.9 kg)     Lab Results  Component Value Date   HGBA1C 6.2 (H) 01/05/2021     Patient Active Problem List   Diagnosis Date Noted   Vitamin D insufficiency 01/10/2021   Memory difficulty 08/18/2019   Right wrist pain 05/04/2019   Mild episode of recurrent major depressive disorder ([place]) 12/11/2018   Depression, major, single episode, mild ([place]) 10/22/2018   Gait abnormality 10/14/2018   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   Moderate episode of recurrent major depressive disorder ([place]) 09/26/2018   Generalized anxiety disorder 09/26/2018   Osteoporosis 01/25/2018   Osteoarthritis of hands, 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   Dyspnea 09/22/2016   OSA (obstructive sleep apnea) 08/31/2016   PAF (paroxysmal atrial fibrillation) ([place]) 03/16/2016   Hypothyroid 03/16/2016   Obesity (BMI 30-39.9) 03/16/2016   Essential hypertension 11/10/2015  Hyperlipidemia 11/10/2015    Past Medical History:  Diagnosis Date   Atrial fibrillation ([place])    Back pain    Constipation    Depression    Diverticulitis    Diverticulitis    Diverticulosis    Esophageal ulcer    Fatty liver    Gait abnormality 10/14/2018   GERD (gastroesophageal reflux disease)    Hyperlipidemia    Hypertension    Hypothyroid    Memory difficulty 08/18/2019   OSA (obstructive sleep  apnea) 08/31/2016   Osteopenia    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   Pre-diabetes     Past Surgical History:  Procedure Laterality Date   BREAST BIOPSY Left    needle core biopsy, benign   CATARACT EXTRACTION     HAND SURGERY     KNEE SURGERY     ROTATOR CUFF REPAIR     TOTAL ABDOMINAL HYSTERECTOMY      Social History   Tobacco Use   Smoking status: Never   Smokeless tobacco: Never  Vaping Use   Vaping Use: Never used  Substance Use Topics   Alcohol use: Yes    Comment: rare   Drug use: No    Family History  Problem Relation Age of Onset   Atrial fibrillation Mother    Congestive Heart Failure Mother    Depression Mother    Heart disease Mother    Stroke Mother    Thyroid disease Mother    Anxiety disorder Mother    Obesity Mother    Heart disease Father    Alcohol abuse Father    High blood pressure Father    High Cholesterol Father    Alcoholism Father    Heart disease Brother    Heart disease Brother    Depression Sister    Depression Daughter     Allergies  Allergen Reactions   Dextran Anaphylaxis   Dextrans    Tree Extract    Penicillin G Rash   Penicillins Rash   Sulfa Antibiotics Rash   Sulfacetamide Sodium Rash    Medication list has been reviewed and updated.  Current Outpatient Medications on File Prior to Visit  Medication Sig Dispense Refill   alendronate (FOSAMAX) 70 MG tablet Take 1 tablet (70 mg total) by mouth every 7 (seven) days. Take with a full glass of water on an empty stomach. 12 tablet 3   amLODipine (NORVASC) 5 MG tablet Take 1 tablet (5 mg total) by mouth daily. 90 tablet 1   atenolol (TENORMIN) 50 MG tablet TAKE 1 TABLET BY MOUTH EVERY DAY 90 tablet 1   flecainide (TAMBOCOR) 100 MG tablet Take 1 tablet (100 mg total) by mouth 2 (two) times daily. 180 tablet 3   FLUoxetine (PROZAC) 40 MG capsule Take 1 capsule (40 mg total) by mouth 2 (two) times daily. 180 capsule 3   levothyroxine (SYNTHROID) 125 MCG tablet TAKE  1 TABLET (125 MCG TOTAL) BY MOUTH DAILY BEFORE BREAKFAST. 90 tablet 3   omeprazole (PRILOSEC) 40 MG capsule Take 40 mg by mouth in the morning and at bedtime.     rivaroxaban (XARELTO) 20 MG TABS tablet Take 20 mg by mouth daily with supper.     rosuvastatin (CRESTOR) 20 MG tablet Take 1 tablet (20 mg total) by mouth daily. 90 tablet 3   traZODone (DESYREL) 50 MG tablet Take 1.5 tablets (75 mg total) by mouth at bedtime. 135 tablet 1   No current facility-administered medications on file prior to visit.  Review of Systems:  As per HPI- otherwise negative.   Physical Examination: Vitals:   06/30/21 1449  BP: 124/70  Pulse: 61  Resp: 18  Temp: 98.8 F (37.1 C)  SpO2: 95%   Vitals:   06/30/21 1449  Weight: 168 lb (76.2 kg)   Body mass index is 31.74 kg/m. Ideal Body Weight:    GEN: no acute distress.  Looks great, she has lost significant weight and appears much healthier HEENT: Atraumatic, Normocephalic.  Ears and Nose: No external deformity. CV: RRR, No M/G/R. No JVD. No thrill. No extra heart sounds. PULM: CTA B, no wheezes, crackles, rhonchi. No retractions. No resp. distress. No accessory muscle use. ABD: S, NT, ND, +BS. No rebound. No HSM. EXTR: No c/c/e PSYCH: Normally interactive. Conversant.  On conversation with Benigna I have never noticed any particular concerns about her memory  Assessment and Plan: Need for influenza vaccination - Plan: Flu Vaccine QUAD High Dose(Fluad)  Constipation, unspecified constipation type  Memory difficulty  Paroxysmal atrial fibrillation ([place])  Other specified hypothyroidism - Plan: TSH  Prediabetes - Plan: Hemoglobin A1c  Essential hypertension - Plan: CBC, Comprehensive metabolic panel  Patient seen today for follow-up.  Flu shot given, labs are pending as above She is currently in sinus rhythm.  Unfortunately her Xarelto has gotten to be very expensive. I gave her a savings card which hopefully will reduce her cost,  also provided some samples.  We only had 15 and 2.5 mg on hand, she will take 15 mg +2.5 + 2.5 to equal total 20  Discussed her memory concerns.  She has follow-up plans with neurology for early next year.  Discussed the fact that her neurocognitive testing suggested her dementia symptoms were actually due to depression.  She had not fully understood this, she is encouraged by this news but remains concerned about her memory  Will plan further follow- up pending labs.   Signed Lamar Blinks, MD  Received her labs as below, message to patient  Results for orders placed or performed in visit on 06/30/21  Hemoglobin A1c  Result Value Ref Range   Hgb A1c MFr Bld 5.7 4.6 - 6.5 %  TSH  Result Value Ref Range   TSH 0.82 0.35 - 5.50 uIU/mL  CBC  Result Value Ref Range   WBC 17.2 (H) 4.0 - 10.5 K/uL   RBC 4.67 3.87 - 5.11 Mil/uL   Platelets 221.0 150.0 - 400.0 K/uL   Hemoglobin 13.4 12.0 - 15.0 g/dL   HCT 41.1 36.0 - 46.0 %   MCV 88.1 78.0 - 100.0 fl   MCHC 32.6 30.0 - 36.0 g/dL   RDW 14.8 11.5 - 15.5 %  Comprehensive metabolic panel  Result Value Ref Range   Sodium 137 135 - 145 mEq/L   Potassium 3.5 3.5 - 5.1 mEq/L   Chloride 100 96 - 112 mEq/L   CO2 27 19 - 32 mEq/L   Glucose, Bld 117 (H) 70 - 99 mg/dL   BUN 30 (H) 6 - 23 mg/dL   Creatinine, Ser 0.67 0.40 - 1.20 mg/dL   Total Bilirubin 0.7 0.2 - 1.2 mg/dL   Alkaline Phosphatase 81 39 - 117 U/L   AST 36 0 - 37 U/L   ALT 46 (H) 0 - 35 U/L   Total Protein 7.4 6.0 - 8.3 g/dL   Albumin 4.3 3.5 - 5.2 g/dL   GFR 88.08 >60.00 mL/min   Calcium 9.9 8.4 - 10.5 mg/dL

## 2021-06-29 DIAGNOSIS — M25561 Pain in right knee: Secondary | ICD-10-CM | POA: Diagnosis not present

## 2021-06-29 DIAGNOSIS — M1711 Unilateral primary osteoarthritis, right knee: Secondary | ICD-10-CM | POA: Diagnosis not present

## 2021-06-30 ENCOUNTER — Ambulatory Visit (INDEPENDENT_AMBULATORY_CARE_PROVIDER_SITE_OTHER): Payer: Medicare Other | Admitting: Family Medicine

## 2021-06-30 ENCOUNTER — Other Ambulatory Visit: Payer: Self-pay

## 2021-06-30 VITALS — BP 124/70 | HR 61 | Temp 98.8°F | Resp 18 | Ht 61.0 in | Wt 168.0 lb

## 2021-06-30 DIAGNOSIS — R413 Other amnesia: Secondary | ICD-10-CM | POA: Diagnosis not present

## 2021-06-30 DIAGNOSIS — R799 Abnormal finding of blood chemistry, unspecified: Secondary | ICD-10-CM | POA: Diagnosis not present

## 2021-06-30 DIAGNOSIS — E038 Other specified hypothyroidism: Secondary | ICD-10-CM | POA: Diagnosis not present

## 2021-06-30 DIAGNOSIS — Z23 Encounter for immunization: Secondary | ICD-10-CM

## 2021-06-30 DIAGNOSIS — K5900 Constipation, unspecified: Secondary | ICD-10-CM

## 2021-06-30 DIAGNOSIS — R7303 Prediabetes: Secondary | ICD-10-CM | POA: Diagnosis not present

## 2021-06-30 DIAGNOSIS — I480 Paroxysmal atrial fibrillation: Secondary | ICD-10-CM

## 2021-06-30 DIAGNOSIS — I10 Essential (primary) hypertension: Secondary | ICD-10-CM | POA: Diagnosis not present

## 2021-07-01 ENCOUNTER — Encounter: Payer: Self-pay | Admitting: Family Medicine

## 2021-07-01 LAB — HEMOGLOBIN A1C: Hgb A1c MFr Bld: 5.7 % (ref 4.6–6.5)

## 2021-07-01 LAB — COMPREHENSIVE METABOLIC PANEL
ALT: 46 U/L — ABNORMAL HIGH (ref 0–35)
AST: 36 U/L (ref 0–37)
Albumin: 4.3 g/dL (ref 3.5–5.2)
Alkaline Phosphatase: 81 U/L (ref 39–117)
BUN: 30 mg/dL — ABNORMAL HIGH (ref 6–23)
CO2: 27 mEq/L (ref 19–32)
Calcium: 9.9 mg/dL (ref 8.4–10.5)
Chloride: 100 mEq/L (ref 96–112)
Creatinine, Ser: 0.67 mg/dL (ref 0.40–1.20)
GFR: 88.08 mL/min (ref >60.00)
Glucose, Bld: 117 mg/dL — ABNORMAL HIGH (ref 70–99)
Potassium: 3.5 mEq/L (ref 3.5–5.1)
Sodium: 137 mEq/L (ref 135–145)
Total Bilirubin: 0.7 mg/dL (ref 0.2–1.2)
Total Protein: 7.4 g/dL (ref 6.0–8.3)

## 2021-07-01 LAB — CBC
HCT: 41.1 % (ref 36.0–46.0)
Hemoglobin: 13.4 g/dL (ref 12.0–15.0)
MCHC: 32.6 g/dL (ref 30.0–36.0)
MCV: 88.1 fl (ref 78.0–100.0)
Platelets: 221 10*3/uL (ref 150.0–400.0)
RBC: 4.67 Mil/uL (ref 3.87–5.11)
RDW: 14.8 % (ref 11.5–15.5)
WBC: 17.2 10*3/uL — ABNORMAL HIGH (ref 4.0–10.5)

## 2021-07-01 LAB — TSH: TSH: 0.82 u[IU]/mL (ref 0.35–5.50)

## 2021-07-01 NOTE — Addendum Note (Signed)
Addended by: Lamar Blinks C on: 07/01/2021 12:33 PM   Modules accepted: Orders

## 2021-07-05 ENCOUNTER — Ambulatory Visit (INDEPENDENT_AMBULATORY_CARE_PROVIDER_SITE_OTHER): Payer: Medicare Other | Admitting: Bariatrics

## 2021-07-05 ENCOUNTER — Other Ambulatory Visit: Payer: Self-pay

## 2021-07-05 ENCOUNTER — Encounter: Payer: Self-pay | Admitting: Family Medicine

## 2021-07-05 ENCOUNTER — Encounter (INDEPENDENT_AMBULATORY_CARE_PROVIDER_SITE_OTHER): Payer: Self-pay | Admitting: Bariatrics

## 2021-07-05 VITALS — BP 105/60 | HR 50 | Temp 98.2°F | Ht 61.0 in | Wt 160.0 lb

## 2021-07-05 DIAGNOSIS — K219 Gastro-esophageal reflux disease without esophagitis: Secondary | ICD-10-CM | POA: Diagnosis not present

## 2021-07-05 DIAGNOSIS — Z6837 Body mass index (BMI) 37.0-37.9, adult: Secondary | ICD-10-CM | POA: Diagnosis not present

## 2021-07-05 DIAGNOSIS — R7303 Prediabetes: Secondary | ICD-10-CM | POA: Diagnosis not present

## 2021-07-05 DIAGNOSIS — K5909 Other constipation: Secondary | ICD-10-CM

## 2021-07-05 NOTE — Progress Notes (Signed)
Chief Complaint:   OBESITY Lorella is here to discuss her progress with her obesity treatment plan along with follow-up of her obesity related diagnoses. Emmaline is on the Category 1 Plan and states she is following her eating plan approximately 95% of the time. [place] states she is walking 30 minutes 4 times per week.  Today's visit was #: 9 Starting weight: 198 lbs Starting date: 01/02/2021 Today's weight: 160 lbs Today's date: 07/05/2021 Total lbs lost to date: 38 Total lbs lost since last in-office visit: 10  Interim History: Kimber is down an additional 10 lbs since her last visit and doing well overall.  Subjective:   1. Pre-diabetes Avabella is not on medication. Her last A1c was 5.7.  2. Gastroesophageal reflux disease, unspecified whether esophagitis present Pt is taking Prilosec.  3. Other constipation Pt is taking Miralax and eating prunes.  Assessment/Plan:   1. Pre-diabetes Maahi will continue to work on weight loss, exercise, and decreasing simple carbohydrates to help decrease the risk of diabetes.   2. Gastroesophageal reflux disease, unspecified whether esophagitis present Intensive lifestyle modifications are the first line treatment for this issue. We discussed several lifestyle modifications today and she will continue to work on diet, exercise and weight loss efforts. Orders and follow up as documented in patient record. Continue current treatment plan.  Counseling If a person has gastroesophageal reflux disease (GERD), food and stomach acid move back up into the esophagus and cause symptoms or problems such as damage to the esophagus. Anti-reflux measures include: raising the head of the bed, avoiding tight clothing or belts, avoiding eating late at night, not lying down shortly after mealtime, and achieving weight loss. Avoid ASA, NSAID's, caffeine, alcohol, and tobacco.  OTC Pepcid and/or Tums are often very helpful for as needed use.  However, for  persisting chronic or daily symptoms, stronger medications like Omeprazole may be needed. You may need to avoid foods and drinks such as: Coffee and tea (with or without caffeine). Drinks that contain alcohol. Energy drinks and sports drinks. Bubbly (carbonated) drinks or sodas. Chocolate and cocoa. Peppermint and mint flavorings. Garlic and onions. Horseradish. Spicy and acidic foods. These include peppers, chili powder, curry powder, vinegar, hot sauces, and BBQ sauce. Citrus fruit juices and citrus fruits, such as oranges, lemons, and limes. Tomato-based foods. These include red sauce, chili, salsa, and pizza with red sauce. Fried and fatty foods. These include donuts, french fries, potato chips, and high-fat dressings. High-fat meats. These include hot dogs, rib eye steak, sausage, ham, and bacon.  3. Other constipation Sarann was informed that a decrease in bowel movement frequency is normal while losing weight, but stools should not be hard or painful. Orders and follow up as documented in patient record. Increase water intake, raw vegetables, and prunes. Use flaxseed and bran.  Counseling Getting to Good Bowel Health: Your goal is to have one soft bowel movement each day. Drink at least 8 glasses of water each day. Eat plenty of fiber (goal is over 25 grams each day). It is best to get most of your fiber from dietary sources which includes leafy green vegetables, fresh fruit, and whole grains. You may need to add fiber with the help of OTC fiber supplements. These include Metamucil, Citrucel, and Flaxseed. If you are still having trouble, try adding Miralax or Magnesium Citrate. If all of these changes do not work, Cabin crew.  4. Obesity, current BMI 30.4  Elliet is currently in the action stage  of change. As such, her goal is to continue with weight loss efforts. She has agreed to the Category 1 Plan.   Meal planning and intentional eating.  Exercise goals:  As is-  increase exercise.  Behavioral modification strategies: increasing lean protein intake, decreasing simple carbohydrates, increasing vegetables, increasing water intake, decreasing eating out, no skipping meals, meal planning and cooking strategies, keeping healthy foods in the home, and planning for success.  Shontia has agreed to follow-up with our clinic in 3 weeks. She was informed of the importance of frequent follow-up visits to maximize her success with intensive lifestyle modifications for her multiple health conditions.   Objective:   Blood pressure 105/60, pulse (!) 50, temperature 98.2 F (36.8 C), height 5\' 1"  (1.549 m), weight 160 lb (72.6 kg), SpO2 94 %. Body mass index is 30.23 kg/m.  General: Cooperative, alert, well developed, in no acute distress. HEENT: Conjunctivae and lids unremarkable. Cardiovascular: Regular rhythm.  Lungs: Normal work of breathing. Neurologic: No focal deficits.   Lab Results  Component Value Date   CREATININE 0.67 06/30/2021   BUN 30 (H) 06/30/2021   NA 137 06/30/2021   K 3.5 06/30/2021   CL 100 06/30/2021   CO2 27 06/30/2021   Lab Results  Component Value Date   ALT 46 (H) 06/30/2021   AST 36 06/30/2021   ALKPHOS 81 06/30/2021   BILITOT 0.7 06/30/2021   Lab Results  Component Value Date   HGBA1C 5.7 06/30/2021   HGBA1C 6.2 (H) 01/05/2021   HGBA1C 5.9 06/23/2019   HGBA1C 5.8 04/15/2018   HGBA1C 5.8 08/13/2017   Lab Results  Component Value Date   INSULIN 20.4 01/05/2021   Lab Results  Component Value Date   TSH 0.82 06/30/2021   Lab Results  Component Value Date   CHOL 186 01/05/2021   HDL 67 01/05/2021   LDLCALC 95 01/05/2021   LDLDIRECT 112.0 01/22/2017   TRIG 138 01/05/2021   CHOLHDL 2.9 06/23/2020   Lab Results  Component Value Date   VD25OH 25.0 (L) 01/05/2021   Lab Results  Component Value Date   WBC 17.2 (H) 06/30/2021   HGB 13.4 06/30/2021   HCT 41.1 06/30/2021   MCV 88.1 06/30/2021   PLT 221.0  06/30/2021   No results found for: IRON, TIBC, FERRITIN  Obesity Behavioral Intervention:   Approximately 15 minutes were spent on the discussion below.  ASK: We discussed the diagnosis of obesity with Shaquira today and Quinnley agreed to give [place] permission to discuss obesity behavioral modification therapy today.  ASSESS: Rosalynd has the diagnosis of obesity and her BMI today is 30.4. Ajna is in the action stage of change.   ADVISE: Sia was educated on the multiple health risks of obesity as well as the benefit of weight loss to improve her health. She was advised of the need for long term treatment and the importance of lifestyle modifications to improve her current health and to decrease her risk of future health problems.  AGREE: Multiple dietary modification options and treatment options were discussed and Saliha agreed to follow the recommendations documented in the above note.  ARRANGE: Danelly was educated on the importance of frequent visits to treat obesity as outlined per CMS and USPSTF guidelines and agreed to schedule her next follow up appointment today.  Attestation Statements:   Reviewed by clinician on day of visit: allergies, medications, problem list, medical history, surgical history, family history, social history, and previous encounter notes.  Coral Ceo, CMA,  am acting as Location manager for [employer], DO.  I have reviewed the above documentation for accuracy and completeness, and I agree with the above. Jearld Lesch, DO

## 2021-07-07 ENCOUNTER — Encounter (INDEPENDENT_AMBULATORY_CARE_PROVIDER_SITE_OTHER): Payer: Self-pay | Admitting: Bariatrics

## 2021-07-10 ENCOUNTER — Other Ambulatory Visit: Payer: Self-pay | Admitting: Family Medicine

## 2021-07-10 DIAGNOSIS — R002 Palpitations: Secondary | ICD-10-CM

## 2021-07-18 ENCOUNTER — Telehealth: Payer: Self-pay | Admitting: Family Medicine

## 2021-07-18 NOTE — Telephone Encounter (Signed)
Left message for patient to call back and schedule Medicare Annual Wellness Visit (AWV) in office.   If not able to come in office, please offer to do virtually or by telephone.  Left office number and my jabber [phone].  Last AWV:06/17/2019  Please schedule at anytime with Nurse Health Advisor.

## 2021-07-20 ENCOUNTER — Other Ambulatory Visit: Payer: Self-pay | Admitting: Family Medicine

## 2021-07-20 DIAGNOSIS — K219 Gastro-esophageal reflux disease without esophagitis: Secondary | ICD-10-CM

## 2021-07-26 ENCOUNTER — Encounter (INDEPENDENT_AMBULATORY_CARE_PROVIDER_SITE_OTHER): Payer: Self-pay | Admitting: Bariatrics

## 2021-07-26 ENCOUNTER — Other Ambulatory Visit: Payer: Self-pay

## 2021-07-26 ENCOUNTER — Ambulatory Visit (INDEPENDENT_AMBULATORY_CARE_PROVIDER_SITE_OTHER): Payer: Medicare Other | Admitting: Bariatrics

## 2021-07-26 ENCOUNTER — Other Ambulatory Visit: Payer: Self-pay | Admitting: Family Medicine

## 2021-07-26 VITALS — BP 134/79 | HR 79 | Temp 98.3°F | Ht 61.0 in | Wt 158.0 lb

## 2021-07-26 DIAGNOSIS — R7303 Prediabetes: Secondary | ICD-10-CM | POA: Diagnosis not present

## 2021-07-26 DIAGNOSIS — E6609 Other obesity due to excess calories: Secondary | ICD-10-CM | POA: Diagnosis not present

## 2021-07-26 DIAGNOSIS — F320 Major depressive disorder, single episode, mild: Secondary | ICD-10-CM

## 2021-07-26 DIAGNOSIS — I10 Essential (primary) hypertension: Secondary | ICD-10-CM

## 2021-07-26 DIAGNOSIS — Z6830 Body mass index (BMI) 30.0-30.9, adult: Secondary | ICD-10-CM | POA: Diagnosis not present

## 2021-07-26 NOTE — Progress Notes (Signed)
Chief Complaint:   OBESITY Lisa Acosta is here to discuss her progress with her obesity treatment plan along with follow-up of her obesity related diagnoses. Lisa Acosta is on the Category 1 Plan and states she is following her eating plan approximately 85% of the time. Lisa Acosta states she is walking for 30 minutes 3 times per week.  Today's visit was #: 10 Starting weight: 198 lbs Starting date: 01/02/2021 Today's weight: 158 lbs Today's date: 07/26/2021 Total lbs lost to date: 40 lbs Total lbs lost since last in-office visit: 2 lbs  Interim History: Lisa Acosta is down 2 lbs. She is at her goal. She did not get to walk as much.   Subjective:   1. Essential hypertension Lisa Acosta is currently taking Norvasc and Tenormin. Her blood pressure is controlled.  2. Prediabetes Lisa Acosta is not on medications currently.   Assessment/Plan:   1. Essential hypertension Lisa Acosta will continue her medications. She is working on healthy weight loss and exercise to improve blood pressure control. We will watch for signs of hypotension as she continues her lifestyle modifications.  2. Prediabetes Lisa Acosta will continue to follow the plan. She will increase healthy fats and protein. She will continue to work on weight loss, exercise, and decreasing simple carbohydrates to help decrease the risk of diabetes.   3. Obesity, current BMI 29.9 Lisa Acosta is currently in the action stage of change. As such, her goal is to continue with weight loss efforts. She has agreed to the Category 1 Plan.   Lisa Acosta will continue meal planning and she will continue intentional eating. She will set a goal weight. Strategies for the holidays were provided today.  Exercise goals: Lisa Acosta will walk 1 - 1 1/4 miles.  Behavioral modification strategies: increasing lean protein intake, decreasing simple carbohydrates, increasing vegetables, increasing water intake, decreasing eating out, no skipping meals, meal planning and cooking strategies, keeping  healthy foods in the home, and planning for success.  Lisa Acosta has agreed to follow-up with our clinic in 3 weeks Lisa Schwab, Lisa Acosta and 6 weeks with myself. She was informed of the importance of frequent follow-up visits to maximize her success with intensive lifestyle modifications for her multiple health conditions.   Objective:   Blood pressure 134/79, pulse 79, temperature 98.3 F (36.8 C), height 5\' 1"  (1.549 m), weight 158 lb (71.7 kg), SpO2 98 %. Body mass index is 29.85 kg/m.  General: Cooperative, alert, well developed, in no acute distress. HEENT: Conjunctivae and lids unremarkable. Cardiovascular: Regular rhythm.  Lungs: Normal work of breathing. Neurologic: No focal deficits.   Lab Results  Component Value Date   CREATININE 0.67 06/30/2021   BUN 30 (H) 06/30/2021   NA 137 06/30/2021   K 3.5 06/30/2021   CL 100 06/30/2021   CO2 27 06/30/2021   Lab Results  Component Value Date   ALT 46 (H) 06/30/2021   AST 36 06/30/2021   ALKPHOS 81 06/30/2021   BILITOT 0.7 06/30/2021   Lab Results  Component Value Date   HGBA1C 5.7 06/30/2021   HGBA1C 6.2 (H) 01/05/2021   HGBA1C 5.9 06/23/2019   HGBA1C 5.8 04/15/2018   HGBA1C 5.8 08/13/2017   Lab Results  Component Value Date   INSULIN 20.4 01/05/2021   Lab Results  Component Value Date   TSH 0.82 06/30/2021   Lab Results  Component Value Date   CHOL 186 01/05/2021   HDL 67 01/05/2021   LDLCALC 95 01/05/2021   LDLDIRECT 112.0 01/22/2017   TRIG 138 01/05/2021  CHOLHDL 2.9 06/23/2020   Lab Results  Component Value Date   VD25OH 25.0 (L) 01/05/2021   Lab Results  Component Value Date   WBC 17.2 (H) 06/30/2021   HGB 13.4 06/30/2021   HCT 41.1 06/30/2021   MCV 88.1 06/30/2021   PLT 221.0 06/30/2021   No results found for: IRON, TIBC, FERRITIN  Obesity Behavioral Intervention:   Approximately 15 minutes were spent on the discussion below.  ASK: We discussed the diagnosis of obesity with Latoia today and  Daenerys agreed to give [place] permission to discuss obesity behavioral modification therapy today.  ASSESS: Jameelah has the diagnosis of obesity and her BMI today is 29.9. Jazsmin is in the action stage of change.   ADVISE: Kindra was educated on the multiple health risks of obesity as well as the benefit of weight loss to improve her health. She was advised of the need for long term treatment and the importance of lifestyle modifications to improve her current health and to decrease her risk of future health problems.  AGREE: Multiple dietary modification options and treatment options were discussed and Leanora agreed to follow the recommendations documented in the above note.  ARRANGE: Malarie was educated on the importance of frequent visits to treat obesity as outlined per CMS and USPSTF guidelines and agreed to schedule her next follow up appointment today.  Attestation Statements:   Reviewed by clinician on day of visit: allergies, medications, problem list, medical history, surgical history, family history, social history, and previous encounter notes.  I, Lizbeth Bark, RMA, am acting as Location manager for [employer], DO.   I have reviewed the above documentation for accuracy and completeness, and I agree with the above. Jearld Lesch, DO

## 2021-07-29 ENCOUNTER — Emergency Department (HOSPITAL_BASED_OUTPATIENT_CLINIC_OR_DEPARTMENT_OTHER): Payer: Medicare Other

## 2021-07-29 ENCOUNTER — Emergency Department (HOSPITAL_BASED_OUTPATIENT_CLINIC_OR_DEPARTMENT_OTHER)
Admission: EM | Admit: 2021-07-29 | Discharge: 2021-07-29 | Disposition: A | Discharge status: Home / Self Care | Payer: Medicare Other | Attending: Emergency Medicine | Admitting: Emergency Medicine

## 2021-07-29 ENCOUNTER — Encounter (HOSPITAL_BASED_OUTPATIENT_CLINIC_OR_DEPARTMENT_OTHER): Payer: Self-pay

## 2021-07-29 ENCOUNTER — Other Ambulatory Visit: Payer: Self-pay

## 2021-07-29 DIAGNOSIS — S0031XA Abrasion of nose, initial encounter: Secondary | ICD-10-CM | POA: Insufficient documentation

## 2021-07-29 DIAGNOSIS — S00511A Abrasion of lip, initial encounter: Secondary | ICD-10-CM | POA: Diagnosis not present

## 2021-07-29 DIAGNOSIS — S0990XA Unspecified injury of head, initial encounter: Secondary | ICD-10-CM | POA: Insufficient documentation

## 2021-07-29 DIAGNOSIS — W01198A Fall on same level from slipping, tripping and stumbling with subsequent striking against other object, initial encounter: Secondary | ICD-10-CM | POA: Insufficient documentation

## 2021-07-29 DIAGNOSIS — Z79899 Other long term (current) drug therapy: Secondary | ICD-10-CM | POA: Diagnosis not present

## 2021-07-29 DIAGNOSIS — S0993XA Unspecified injury of face, initial encounter: Secondary | ICD-10-CM | POA: Diagnosis present

## 2021-07-29 DIAGNOSIS — Z043 Encounter for examination and observation following other accident: Secondary | ICD-10-CM | POA: Diagnosis not present

## 2021-07-29 DIAGNOSIS — S0011XA Contusion of right eyelid and periocular area, initial encounter: Secondary | ICD-10-CM | POA: Diagnosis not present

## 2021-07-29 DIAGNOSIS — E039 Hypothyroidism, unspecified: Secondary | ICD-10-CM | POA: Diagnosis not present

## 2021-07-29 DIAGNOSIS — S0012XA Contusion of left eyelid and periocular area, initial encounter: Secondary | ICD-10-CM | POA: Insufficient documentation

## 2021-07-29 DIAGNOSIS — S0081XA Abrasion of other part of head, initial encounter: Secondary | ICD-10-CM | POA: Diagnosis not present

## 2021-07-29 DIAGNOSIS — Z7901 Long term (current) use of anticoagulants: Secondary | ICD-10-CM | POA: Diagnosis not present

## 2021-07-29 DIAGNOSIS — G319 Degenerative disease of nervous system, unspecified: Secondary | ICD-10-CM | POA: Diagnosis not present

## 2021-07-29 DIAGNOSIS — R220 Localized swelling, mass and lump, head: Secondary | ICD-10-CM | POA: Diagnosis not present

## 2021-07-29 DIAGNOSIS — Y9301 Activity, walking, marching and hiking: Secondary | ICD-10-CM | POA: Diagnosis not present

## 2021-07-29 DIAGNOSIS — I10 Essential (primary) hypertension: Secondary | ICD-10-CM | POA: Diagnosis not present

## 2021-07-29 MED ORDER — OXYCODONE-ACETAMINOPHEN 5-325 MG PO TABS
2.0000 | ORAL_TABLET | Freq: Once | ORAL | Status: AC
  Administered 2021-07-29: 2 via ORAL
  Filled 2021-07-29: qty 2

## 2021-07-29 MED ORDER — TRAMADOL HCL 50 MG PO TABS: 50.0000 mg | ORAL_TABLET | Freq: Four times a day (QID) | ORAL | 0 refills | Status: DC | PRN

## 2021-07-29 NOTE — ED Triage Notes (Addendum)
Pt reports she fell after walking laps on a cross [place] track. Legs felt weak afterwards and she fell face forward after letting go of her walker resulting in laceration to bridge of nose and upper lip. Denies loss of consciousness. Pt is on blood products

## 2021-07-29 NOTE — ED Provider Notes (Signed)
[facility] EMERGENCY DEPARTMENT Provider Note   CSN: [id number] Arrival date & time: 07/29/21  1613     History Chief Complaint  Patient presents with   Fall   Laceration    Lisa Acosta is a 70 y.o. female who presents emergency department after mechanical fall.  Patient states that she was walking on the track with her walker.  She does says that she pushed herself a little harder than usual did an extra lap and walking very fast.  When she got back to the start of the track she got weak in her legs and fell forward hitting her face on the track.  She had immediate nosebleed.  She denies any severe headache, loss of consciousness.  She did not feel presyncopal.  She states she simply fell forward and lost her footing.  Patient is on Xarelto and is anticoagulated for A. fib.  She denies any other injuries.  She is up-to-date on her tetanus vaccination.   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   Pre-diabetes     Patient Active Problem List   Diagnosis Date Noted   Vitamin D insufficiency 01/10/2021   Memory difficulty 08/18/2019   Right wrist pain 05/04/2019   Mild episode of recurrent major depressive disorder ([place]) 12/11/2018   Depression, major, single episode, mild ([place]) 10/22/2018   Gait abnormality 10/14/2018   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   Moderate episode of recurrent major depressive disorder ([place]) 09/26/2018   Generalized anxiety disorder 09/26/2018   Osteoporosis 01/25/2018   Osteoarthritis of  hands, 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   Dyspnea 09/22/2016   OSA (obstructive sleep apnea) 08/31/2016   PAF (paroxysmal atrial fibrillation) ([place]) 03/16/2016   Hypothyroid 03/16/2016   Obesity (BMI 30-39.9) 03/16/2016   Essential hypertension 11/10/2015   Hyperlipidemia 11/10/2015    Past Surgical History:  Procedure Laterality Date   BREAST BIOPSY Left    needle core biopsy, benign   CATARACT EXTRACTION     HAND SURGERY     KNEE SURGERY     ROTATOR CUFF REPAIR     TOTAL ABDOMINAL HYSTERECTOMY       OB History     Gravida  2   Para  1   Term      Preterm      AB      Living         SAB      IAB      Ectopic      Multiple      Live Births              Family History  Problem Relation Age of Onset   Atrial fibrillation Mother    Congestive Heart Failure Mother    Depression Mother    Heart disease Mother    Stroke Mother    Thyroid disease Mother    Anxiety disorder Mother    Obesity Mother    Heart disease  Father    Alcohol abuse Father    High blood pressure Father    High Cholesterol Father    Alcoholism Father    Heart disease Brother    Heart disease Brother    Depression Sister    Depression Daughter     Social History   Tobacco Use   Smoking status: Never   Smokeless tobacco: Never  Vaping Use   Vaping Use: Never used  Substance Use Topics   Alcohol use: Yes    Comment: rare   Drug use: No    Home Medications Prior to Admission medications   Medication Sig Start Date End Date Taking? Authorizing Provider  traMADol (ULTRAM) 50 MG tablet Take 1 tablet (50 mg total) by mouth every 6 (six) hours as needed. 07/29/21  Yes Seda Kronberg, PA-C  alendronate (FOSAMAX) 70 MG tablet Take 1 tablet (70 mg total) by mouth every 7 (seven) days. Take with a full glass of water on an empty stomach. 01/24/21   Copland, Gay Filler, MD  amLODipine (NORVASC) 5 MG tablet Take 1 tablet (5 mg total) by mouth daily. 02/22/21   Copland, Gay Filler, MD   atenolol (TENORMIN) 50 MG tablet TAKE 1 TABLET BY MOUTH EVERY DAY 07/11/21   Copland, Gay Filler, MD  flecainide (TAMBOCOR) 100 MG tablet Take 1 tablet (100 mg total) by mouth 2 (two) times daily. 10/27/20   Copland, Gay Filler, MD  FLUoxetine (PROZAC) 40 MG capsule TAKE 1 CAPSULE BY MOUTH TWICE A DAY 07/26/21   Copland, Gay Filler, MD  levothyroxine (SYNTHROID) 125 MCG tablet TAKE 1 TABLET (125 MCG TOTAL) BY MOUTH DAILY BEFORE BREAKFAST. 01/28/21   Copland, Gay Filler, MD  omeprazole (PRILOSEC) 40 MG capsule TAKE 1 CAPSULE BY MOUTH TWICE A DAY 07/21/21   Copland, Gay Filler, MD  rivaroxaban (XARELTO) 20 MG TABS tablet Take 20 mg by mouth daily with supper.    [provider]  rosuvastatin (CRESTOR) 20 MG tablet Take 1 tablet (20 mg total) by mouth daily. 12/20/20 12/15/21  Copland, Gay Filler, MD  traZODone (DESYREL) 50 MG tablet Take 1.5 tablets (75 mg total) by mouth at bedtime. 03/04/21   Copland, Gay Filler, MD    Allergies    Dextran, Dextrans, Tree extract, Penicillin g, Penicillins, Sulfa antibiotics, and Sulfacetamide sodium  Review of Systems   Review of Systems Ten systems reviewed and are negative for acute change, except as noted in the HPI.   Physical Exam Updated Vital Signs BP (!) 108/50   Pulse (!) 57   Temp 98.2 F (36.8 C) (Oral)   Resp 16   Ht 5\' 1"  (1.549 m)   Wt 71.7 kg   SpO2 96%   BMI 29.85 kg/m   Physical Exam Vitals and nursing note reviewed.  Constitutional:      General: She is not in acute distress.    Appearance: She is well-developed. She is not diaphoretic.  HENT:     Head: Normocephalic.     Comments: Abrasion to the left upper lip and philtrum    Right Ear: External ear normal.     Left Ear: External ear normal.     Nose:     Comments: Abrasions to the nose or bridge.  No lacerations.  Bruising bilaterally near the medial canthus of the eye.  Epistaxis resolved however there is dried blood in the nares.    Mouth/Throat:     Mouth: Mucous  membranes are moist.     Comments: No  blood on the posterior oropharynx Eyes:     General: No scleral icterus.    Extraocular Movements: Extraocular movements intact.     Conjunctiva/sclera: Conjunctivae normal.     Comments: No pain with eye movement  Cardiovascular:     Rate and Rhythm: Normal rate and regular rhythm.     Heart sounds: Normal heart sounds. No murmur heard.   No friction rub. No gallop.  Pulmonary:     Effort: Pulmonary effort is normal. No respiratory distress.     Breath sounds: Normal breath sounds.  Abdominal:     General: Bowel sounds are normal. There is no distension.     Palpations: Abdomen is soft. There is no mass.     Tenderness: There is no abdominal tenderness. There is no guarding.  Musculoskeletal:     Cervical back: Normal range of motion.  Skin:    General: Skin is warm and dry.  Neurological:     General: No focal deficit present.     Mental Status: She is alert and oriented to person, place, and time.     Cranial Nerves: No cranial nerve deficit.     Sensory: No sensory deficit.     Motor: No weakness.     Coordination: Coordination normal.     Gait: Gait normal.  Psychiatric:        Behavior: Behavior normal.    ED Results / Procedures / Treatments   Labs (all labs ordered are listed, but only abnormal results are displayed) Labs Reviewed - No data to display  EKG None  Radiology CT Head Wo Contrast  Result Date: 07/29/2021 CLINICAL DATA:  Status post fall. EXAM: CT HEAD WITHOUT CONTRAST TECHNIQUE: Contiguous axial images were obtained from the base of the skull through the vertex without intravenous contrast. COMPARISON:  None. FINDINGS: Brain: There is mild cerebral atrophy with widening of the extra-axial spaces and ventricular dilatation. There are areas of decreased attenuation within the white matter tracts of the supratentorial brain, consistent with microvascular disease changes. Vascular: No hyperdense vessel or unexpected  calcification. Skull: Normal. Negative for fracture or focal lesion. Sinuses/Orbits: No acute finding. Other: There is mild left frontal scalp soft tissue swelling. IMPRESSION: Mild left frontal scalp soft tissue swelling without evidence of an acute fracture or acute intracranial abnormality. Electronically Signed   By: Virgina Norfolk M.D.   On: 07/29/2021 17:01   CT Cervical Spine Wo Contrast  Result Date: 07/29/2021 CLINICAL DATA:  Status post fall. EXAM: CT CERVICAL SPINE WITHOUT CONTRAST TECHNIQUE: Multidetector CT imaging of the cervical spine was performed without intravenous contrast. Multiplanar CT image reconstructions were also generated. COMPARISON:  June 11, 2020 FINDINGS: Alignment: Approximately 1 mm anterolisthesis of the C4 vertebral body is noted on C5, with approximately 2 mm anterolisthesis of C5 vertebral body on C6. Skull base and vertebrae: No acute fracture. No primary bone lesion or focal pathologic process. Soft tissues and spinal canal: No prevertebral fluid or swelling. No visible canal hematoma. Disc levels: Mild to moderate severity endplate sclerosis and anterior osteophyte formation are seen at the levels of C5-C6, C6-C7 and C7-T1. Mild multilevel intervertebral disc space narrowing is seen throughout the cervical spine. Bilateral marked severity multilevel facet joint hypertrophy is noted, right greater than left. Upper chest: Negative. Other: None. IMPRESSION: 1. No acute fracture within the cervical spine. 2. Mild to moderate severity degenerative changes throughout the cervical spine, as described above. 3. Approximately 1 mm anterolisthesis of the C4 vertebral body on C5,  with approximately 2 mm anterolisthesis of C5 vertebral body on C6. Electronically Signed   By: Virgina Norfolk M.D.   On: 07/29/2021 17:03   CT Maxillofacial Wo Contrast  Result Date: 07/29/2021 CLINICAL DATA:  Status post fall. EXAM: CT MAXILLOFACIAL WITHOUT CONTRAST TECHNIQUE: Multidetector  CT imaging of the maxillofacial structures was performed. Multiplanar CT image reconstructions were also generated. COMPARISON:  None. FINDINGS: Osseous: No fracture or mandibular dislocation. No destructive process. Orbits: Negative. No traumatic or inflammatory finding. Sinuses: Clear. Soft tissues: Mild bilateral paranasal soft tissue swelling is seen, left greater than right. Mild left frontal scalp soft tissue swelling is noted. Limited intracranial: No significant or unexpected finding. IMPRESSION: 1. Mild bilateral paranasal soft tissue swelling, left greater than right. 2. Mild left frontal scalp soft tissue swelling. 3. No evidence of acute fracture or mandibular dislocation. Electronically Signed   By: Virgina Norfolk M.D.   On: 07/29/2021 17:05    Procedures Procedures   Medications Ordered in ED Medications  oxyCODONE-acetaminophen (PERCOCET/ROXICET) 5-325 MG per tablet 2 tablet (2 tablets Oral Given 07/29/21 1643)    ED Course  I have reviewed the triage vital signs and the nursing notes.  Pertinent labs & imaging results that were available during my care of the patient were reviewed by me and considered in my medical decision making (see chart for details).    MDM Rules/Calculators/A&P                         Patient here with mechanical fall. I ordered and reviewed CT images of the head, C-spine and maxillofacial.  I personally reviewed these images and interpreted them.  There are no acute findings.  Patient did have a significant change in her blood pressure after she was given Percocet as a standing order for pain control.  Patient pain was improved.  She has no evidence of nasal bone fracture, orbital fracture or entrapment.  Patient's wound cleaned.  No evidence of laceration.  Wound care and outpatient follow-up discussed.  She appears otherwise appropriate for discharge. PDMP reviewed during this encounter.  Final Clinical Impression(s) / ED Diagnoses Final diagnoses:   Facial abrasion, initial encounter    Rx / DC Orders ED Discharge Orders          Ordered    traMADol (ULTRAM) 50 MG tablet  Every 6 hours PRN        07/29/21 1841             Margarita Mail, PA-C 07/29/21 1908    Drenda Freeze, MD 07/30/21 1455

## 2021-07-29 NOTE — Discharge Instructions (Addendum)
Contact a health care provider if: You received a tetanus shot, and you have swelling, severe pain, redness, or bleeding at your injection site. Your pain is not controlled with medicine. You have a fever. You have any of these signs of infection: Redness, swelling, or more pain around your wound. Warmth coming from your wound. Blood, fluid, pus, or a bad smell coming from your wound. Get help right away if: You have a red streak spreading away from your wound.

## 2021-08-01 ENCOUNTER — Other Ambulatory Visit: Payer: Self-pay | Admitting: Family Medicine

## 2021-08-02 DIAGNOSIS — Z23 Encounter for immunization: Secondary | ICD-10-CM | POA: Diagnosis not present

## 2021-08-09 ENCOUNTER — Encounter: Payer: Self-pay | Admitting: Family Medicine

## 2021-08-09 ENCOUNTER — Other Ambulatory Visit: Payer: Self-pay

## 2021-08-09 ENCOUNTER — Ambulatory Visit (INDEPENDENT_AMBULATORY_CARE_PROVIDER_SITE_OTHER): Payer: Medicare Other | Admitting: Family Medicine

## 2021-08-09 VITALS — BP 118/76 | HR 55 | Temp 98.7°F | Ht 61.0 in | Wt 159.1 lb

## 2021-08-09 DIAGNOSIS — S0083XA Contusion of other part of head, initial encounter: Secondary | ICD-10-CM | POA: Diagnosis not present

## 2021-08-09 NOTE — Progress Notes (Signed)
Musculoskeletal Exam  Patient: Lisa Acosta DOB: Mar 15, 1951  DOS: 08/09/2021  SUBJECTIVE:  Chief Complaint:   Chief Complaint  Patient presents with   Lisa Acosta is a 70 y.o.  female for evaluation and treatment of facial pain.   Onset:  11 days ago.  The patient was walking on the track and normally only goes 4-5 laps.  She was walking with a friend and walked a few extra laps.  She had a back to her car, she felt like her legs were Jell-O.  She tried to sit down to rest but did not make it and fell on her face.  She went to the emergency department and had an unremarkable CT sinus. Location: L side of face above eye and just below it Character:  throbbing  Progression of issue:  has improved Associated symptoms: Bruising and swelling Treatment: to date has been rest and ice.   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 No acute distress. HEENT: MMM, PERRLA, sclera white  Thorax & Lungs: No accessory muscle use Musculoskeletal: Sinuses Tenderness to palpation: mild ttp over the L orbit Deformity: no Ecchymosis: yes Neurologic: Normal sensory function.  Psychiatric: Normal mood. Age appropriate judgment and insight. Alert & oriented x 3.    Assessment:  Facial contusion, initial  encounter  Plan: Strengthening exercises recommended for the lower extremities.  Continue to walk.  Ice, Tylenol.  F/u prn. The patient voiced understanding and agreement to the plan.   Marissa, DO 08/09/21  12:20 PM

## 2021-08-09 NOTE — Patient Instructions (Addendum)
Try to do some strengthening exercises for your calves, hamstrings and quads.  The bruising and bump over your eye can take 3-4 weeks to resolve.   Ice/cold pack over area for 10-15 min twice daily.  OK to take Tylenol 1000 mg (2 extra strength tabs) or 975 mg (3 regular strength tabs) every 6 hours as needed.  Artificial tears like Refresh and Systane may be used for comfort. OK to get generic version. Generally people use them every 2-4 hours, but you can use them as much as you want because there is no medication in it.  Let us know if you need anything.

## 2021-08-10 DIAGNOSIS — M25561 Pain in right knee: Secondary | ICD-10-CM | POA: Diagnosis not present

## 2021-08-10 DIAGNOSIS — M1711 Unilateral primary osteoarthritis, right knee: Secondary | ICD-10-CM | POA: Diagnosis not present

## 2021-08-18 DIAGNOSIS — Z20822 Contact with and (suspected) exposure to covid-19: Secondary | ICD-10-CM | POA: Diagnosis not present

## 2021-08-23 ENCOUNTER — Ambulatory Visit (INDEPENDENT_AMBULATORY_CARE_PROVIDER_SITE_OTHER): Payer: Medicare Other | Admitting: Family Medicine

## 2021-08-24 DIAGNOSIS — M25561 Pain in right knee: Secondary | ICD-10-CM | POA: Diagnosis not present

## 2021-08-25 ENCOUNTER — Other Ambulatory Visit: Payer: Self-pay | Admitting: Family Medicine

## 2021-08-25 DIAGNOSIS — I480 Paroxysmal atrial fibrillation: Secondary | ICD-10-CM

## 2021-09-06 DIAGNOSIS — Z20822 Contact with and (suspected) exposure to covid-19: Secondary | ICD-10-CM | POA: Diagnosis not present

## 2021-09-08 ENCOUNTER — Encounter: Payer: Self-pay | Admitting: Family Medicine

## 2021-09-08 MED ORDER — MOLNUPIRAVIR EUA 200MG CAPSULE: 4.0000 | ORAL_CAPSULE | Freq: Two times a day (BID) | ORAL | 0 refills | Status: AC

## 2021-09-08 NOTE — Telephone Encounter (Signed)
Are you willing to send an antiviral so that she does not miss the window?

## 2021-09-18 ENCOUNTER — Encounter: Payer: Self-pay | Admitting: Family Medicine

## 2021-09-19 ENCOUNTER — Ambulatory Visit (INDEPENDENT_AMBULATORY_CARE_PROVIDER_SITE_OTHER): Payer: Medicare Other | Admitting: Bariatrics

## 2021-09-19 DIAGNOSIS — H04123 Dry eye syndrome of bilateral lacrimal glands: Secondary | ICD-10-CM | POA: Diagnosis not present

## 2021-09-19 DIAGNOSIS — M1711 Unilateral primary osteoarthritis, right knee: Secondary | ICD-10-CM | POA: Diagnosis not present

## 2021-09-19 DIAGNOSIS — M25561 Pain in right knee: Secondary | ICD-10-CM | POA: Diagnosis not present

## 2021-09-19 DIAGNOSIS — S0012XA Contusion of left eyelid and periocular area, initial encounter: Secondary | ICD-10-CM | POA: Diagnosis not present

## 2021-09-19 NOTE — Telephone Encounter (Signed)
Pt tested and treated for covid on 09/08/21. Please advise.

## 2021-09-22 ENCOUNTER — Encounter: Payer: Self-pay | Admitting: Family Medicine

## 2021-09-22 DIAGNOSIS — G4700 Insomnia, unspecified: Secondary | ICD-10-CM

## 2021-09-22 MED ORDER — TRAZODONE HCL 50 MG PO TABS: 75.0000 mg | ORAL_TABLET | Freq: Every day | ORAL | 1 refills | Status: DC

## 2021-10-21 ENCOUNTER — Other Ambulatory Visit: Payer: Self-pay | Admitting: Family Medicine

## 2021-10-21 DIAGNOSIS — I480 Paroxysmal atrial fibrillation: Secondary | ICD-10-CM

## 2021-10-24 ENCOUNTER — Ambulatory Visit (INDEPENDENT_AMBULATORY_CARE_PROVIDER_SITE_OTHER): Payer: Medicare Other | Admitting: Family Medicine

## 2021-10-24 ENCOUNTER — Other Ambulatory Visit: Payer: Self-pay

## 2021-10-24 ENCOUNTER — Encounter (INDEPENDENT_AMBULATORY_CARE_PROVIDER_SITE_OTHER): Payer: Self-pay | Admitting: Family Medicine

## 2021-10-24 VITALS — BP 133/64 | HR 54 | Temp 98.2°F | Ht 61.0 in | Wt 152.0 lb

## 2021-10-24 DIAGNOSIS — E66812 Obesity, class 2: Secondary | ICD-10-CM

## 2021-10-24 DIAGNOSIS — I10 Essential (primary) hypertension: Secondary | ICD-10-CM

## 2021-10-24 DIAGNOSIS — Z6828 Body mass index (BMI) 28.0-28.9, adult: Secondary | ICD-10-CM

## 2021-10-24 DIAGNOSIS — E669 Obesity, unspecified: Secondary | ICD-10-CM | POA: Diagnosis not present

## 2021-10-24 DIAGNOSIS — Z20822 Contact with and (suspected) exposure to covid-19: Secondary | ICD-10-CM | POA: Diagnosis not present

## 2021-10-25 NOTE — Progress Notes (Signed)
Chief Complaint:   OBESITY Lisa Acosta is here to discuss her progress with her obesity treatment plan along with follow-up of her obesity related diagnoses. Lisa Acosta is on the Category 1 Plan and states she is following her eating plan approximately 60% of the time. Lisa Acosta states she is walking 1-2 miles 7 times per week.  Today's visit was #: 11 Starting weight: 198 lbs Starting date: 01/02/2021 Today's weight: 152 lbs Today's date: 10/24/2021 Total lbs lost to date: 46 Total lbs lost since last in-office visit: 6  Interim History: Pt just got back from [place] (3 weeks) visiting her sister. She is walking 1-2 miles a day; wants to keep up this amount of activity in the next few weeks. Her grandson's 18th birthday is in March. Pt thinks she needs to have meniscus surgery in the upcoming few weeks. She wants to start being more creative with meal plan. Pt is seeing PCP in a few days.  Subjective:   1. Essential hypertension BP well controlled today. Lisa Acosta denies chest pain/chest pressure/headache. She is on Norvasc and Tenormin.  Assessment/Plan:   1. Essential hypertension Lisa Acosta is working on healthy weight loss and exercise to improve blood pressure control. We will watch for signs of hypotension as she continues her lifestyle modifications. Continue current treatment plan with no changes.  2. Obesity with current BMI of 28.8 Lisa Acosta is currently in the action stage of change. As such, her goal is to continue with weight loss efforts. She has agreed to the Category 1 Plan.   Exercise goals: All adults should avoid inactivity. Some physical activity is better than none, and adults who participate in any amount of physical activity gain some health benefits.  Behavioral modification strategies: increasing lean protein intake, meal planning and cooking strategies, keeping healthy foods in the home, and planning for success.  Lisa Acosta has agreed to follow-up with our clinic in 3 weeks. She was  informed of the importance of frequent follow-up visits to maximize her success with intensive lifestyle modifications for her multiple health conditions.   Objective:   Blood pressure 133/64, pulse (!) 54, temperature 98.2 F (36.8 C), height 5\' 1"  (1.549 m), weight 152 lb (68.9 kg), SpO2 97 %. Body mass index is 28.72 kg/m.  General: Cooperative, alert, well developed, in no acute distress. HEENT: Conjunctivae and lids unremarkable. Cardiovascular: Regular rhythm.  Lungs: Normal work of breathing. Neurologic: No focal deficits.   Lab Results  Component Value Date   CREATININE 0.67 06/30/2021   BUN 30 (H) 06/30/2021   NA 137 06/30/2021   K 3.5 06/30/2021   CL 100 06/30/2021   CO2 27 06/30/2021   Lab Results  Component Value Date   ALT 46 (H) 06/30/2021   AST 36 06/30/2021   ALKPHOS 81 06/30/2021   BILITOT 0.7 06/30/2021   Lab Results  Component Value Date   HGBA1C 5.7 06/30/2021   HGBA1C 6.2 (H) 01/05/2021   HGBA1C 5.9 06/23/2019   HGBA1C 5.8 04/15/2018   HGBA1C 5.8 08/13/2017   Lab Results  Component Value Date   INSULIN 20.4 01/05/2021   Lab Results  Component Value Date   TSH 0.82 06/30/2021   Lab Results  Component Value Date   CHOL 186 01/05/2021   HDL 67 01/05/2021   LDLCALC 95 01/05/2021   LDLDIRECT 112.0 01/22/2017   TRIG 138 01/05/2021   CHOLHDL 2.9 06/23/2020   Lab Results  Component Value Date   VD25OH 25.0 (L) 01/05/2021   Lab Results  Component Value Date   WBC 17.2 (H) 06/30/2021   HGB 13.4 06/30/2021   HCT 41.1 06/30/2021   MCV 88.1 06/30/2021   PLT 221.0 06/30/2021    Attestation Statements:   Reviewed by clinician on day of visit: allergies, medications, problem list, medical history, surgical history, family history, social history, and previous encounter notes.  Coral Ceo, CMA, am acting as transcriptionist for Coralie Common, MD.   I have reviewed the above documentation for accuracy and completeness, and I  agree with the above. - Coralie Common, MD

## 2021-10-26 DIAGNOSIS — M1711 Unilateral primary osteoarthritis, right knee: Secondary | ICD-10-CM | POA: Diagnosis not present

## 2021-10-26 DIAGNOSIS — S83241A Other tear of medial meniscus, current injury, right knee, initial encounter: Secondary | ICD-10-CM | POA: Diagnosis not present

## 2021-10-27 ENCOUNTER — Ambulatory Visit (INDEPENDENT_AMBULATORY_CARE_PROVIDER_SITE_OTHER): Payer: Medicare Other

## 2021-10-27 ENCOUNTER — Encounter: Payer: Self-pay | Admitting: Family

## 2021-10-27 ENCOUNTER — Ambulatory Visit (INDEPENDENT_AMBULATORY_CARE_PROVIDER_SITE_OTHER): Payer: Medicare Other | Admitting: Family

## 2021-10-27 VITALS — BP 143/69 | HR 55 | Temp 98.1°F | Resp 20 | Ht 61.0 in | Wt 155.6 lb

## 2021-10-27 VITALS — BP 143/69 | HR 55 | Temp 98.1°F | Resp 20 | Ht 61.0 in | Wt 155.0 lb

## 2021-10-27 DIAGNOSIS — H6121 Impacted cerumen, right ear: Secondary | ICD-10-CM | POA: Diagnosis not present

## 2021-10-27 DIAGNOSIS — Z0000 Encounter for general adult medical examination without abnormal findings: Secondary | ICD-10-CM | POA: Diagnosis not present

## 2021-10-27 LAB — EAR WAX REMOVAL

## 2021-10-27 MED ORDER — AZITHROMYCIN 250 MG PO TABS: ORAL_TABLET | ORAL | 0 refills | Status: DC

## 2021-10-27 NOTE — Progress Notes (Signed)
Lisa Acosta is a 71 y.o. 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) 03/16/2016   Hypothyroid 03/16/2016   Obesity (BMI 30-39.9) 03/16/2016   Essential hypertension 11/10/2015   Hyperlipidemia 11/10/2015    Current Outpatient Medications  Medication Sig Dispense Refill   alendronate (FOSAMAX) 70 MG tablet Take 1 tablet (70 mg total) by mouth every 7 (seven) days. Take with a full glass of water on an empty stomach. 12 tablet 3   amLODipine (NORVASC) 5 MG tablet TAKE 1 TABLET (5 MG TOTAL) BY MOUTH DAILY. 90 tablet 1   atenolol (TENORMIN) 50 MG tablet TAKE 1 TABLET BY MOUTH EVERY DAY 90 tablet 1   azithromycin (ZITHROMAX) 250 MG tablet 2 tabs po qd x 1 day; 1 tablet per day x 4 days; 6 tablet 0   flecainide (TAMBOCOR) 100 MG tablet Take 1 tablet (100 mg total) by mouth 2 (two) times daily. 180 tablet 3   FLUoxetine (PROZAC) 40 MG capsule TAKE 1 CAPSULE BY MOUTH TWICE A DAY 180 capsule 1   levothyroxine (SYNTHROID) 125 MCG tablet TAKE 1 TABLET (125 MCG TOTAL) BY MOUTH DAILY BEFORE BREAKFAST. 90 tablet 3   omeprazole (PRILOSEC) 40 MG capsule TAKE 1 CAPSULE BY MOUTH TWICE A DAY 180 capsule 3   rosuvastatin (CRESTOR) 20 MG tablet Take 1 tablet (20 mg total) by mouth daily. 90 tablet 3   traZODone (DESYREL) 50 MG tablet Take  1.5 tablets (75 mg total) by mouth at bedtime. 135 tablet 1   XARELTO 20 MG TABS tablet TAKE 1 TABLET BY MOUTH DAILY WITH SUPPER. 90 tablet 1   No current facility-administered medications for this visit.    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   Pre-diabetes     Past Surgical History:  Procedure Laterality Date   BREAST BIOPSY Left    needle core biopsy, benign   CATARACT EXTRACTION     HAND SURGERY     KNEE SURGERY     ROTATOR CUFF REPAIR     TOTAL ABDOMINAL HYSTERECTOMY      Family History  Problem Relation Age of Onset   Atrial fibrillation Mother    Congestive Heart Failure Mother    Depression Mother    Heart disease Mother    Stroke Mother  Thyroid disease Mother    Anxiety disorder Mother    Obesity Mother    Heart disease Father    Alcohol abuse Father    High blood pressure Father    High Cholesterol Father    Alcoholism Father    Heart disease Brother    Heart disease Brother    Depression Sister    Depression Daughter     Social History   Tobacco Use   Smoking status: Never   Smokeless tobacco: Never  Substance Use Topics   Alcohol use: Yes    Comment: rare    Subjective:  Requesting ear lavage; was evaluated by audiology and told that had ear wax; per patient, she may need ear lavage of the right ear every 1-2 years;    Objective:  Vitals:   10/27/21 1356  BP: (!) 143/69  Pulse: (!) 55  Resp: 20  Temp: 98.1 F  (36.7 C)  TempSrc: Oral  SpO2: 98%  Weight: 155 lb 9.6 oz (70.6 kg)  Height: 5\' 1"  (1.549 m)    General: Well developed, well nourished, in no acute distress  Skin : Warm and dry.  Head: Normocephalic and atraumatic  Eyes: Sclera and conjunctiva clear; pupils round and reactive to light; extraocular movements intact  Ears: External normal; after lavage, right canal erythematous;  tympanic membranes normal  Oropharynx: Pink, supple. No suspicious lesions  Neck: Supple without thyromegaly, adenopathy  Lungs: Respirations unlabored;  Neurologic: Alert and oriented; speech intact; face symmetrical; moves all extremities well; CNII-XII intact without focal deficit   Assessment:  1. Impacted cerumen of right ear     Plan:   Ear lavage completed with no difficulty; Rx for Z-pak to treat for outer ear infection- patient prefers oral medication rather than drops;  This visit occurred during the SARS-CoV-2 public health emergency.  Safety protocols were in place, including screening questions prior to the visit, additional usage of staff PPE, and extensive cleaning of exam room while observing appropriate contact time as indicated for disinfecting solutions.    No follow-ups on file.  Orders Placed This Encounter  Procedures   Ear wax removal    Requested Prescriptions   Signed Prescriptions Disp Refills   azithromycin (ZITHROMAX) 250 MG tablet 6 tablet 0    Sig: 2 tabs po qd x 1 day; 1 tablet per day x 4 days;

## 2021-10-27 NOTE — Progress Notes (Signed)
Subjective:   Eldine Leyan Branden is a 71 y.o. female who presents for Medicare Annual (Subsequent) preventive examination.  Review of Systems     Cardiac Risk Factors include: advanced age (>37men, >80 women);hypertension;dyslipidemia     Objective:    Today's Vitals   10/27/21 1500  BP: (!) 143/69  Pulse: (!) 55  Resp: 20  Temp: 98.1 F (36.7 C)  Weight: 155 lb (70.3 kg)  Height: 5\' 1"  (1.549 m)   Body mass index is 29.29 kg/m.  Advanced Directives 10/27/2021 07/29/2021 02/08/2021 06/11/2020 09/24/2019 08/13/2019 06/17/2019  Does Patient Have a Medical Advance Directive? Yes Yes Yes Yes No Yes Yes  Type of Advance Directive Healthcare Power of [place];Living will  Does patient want to make changes to medical advance directive? - - - - - - No - Patient declined  Copy of [place] in Chart? Yes - validated most recent copy scanned in chart (See row information) No - copy requested - - - - No - copy requested  Would patient like information on creating a medical advance directive? - - - No - Patient declined - - -    Current Medications (verified) Outpatient Encounter Medications as of 10/27/2021  Medication Sig   alendronate (FOSAMAX) 70 MG tablet Take 1 tablet (70 mg total) by mouth every 7 (seven) days. Take with a full glass of water on an empty stomach.   amLODipine (NORVASC) 5 MG tablet TAKE 1 TABLET (5 MG TOTAL) BY MOUTH DAILY.   atenolol (TENORMIN) 50 MG tablet TAKE 1 TABLET BY MOUTH EVERY DAY   azithromycin (ZITHROMAX) 250 MG tablet 2 tabs po qd x 1 day; 1 tablet per day x 4 days;   flecainide (TAMBOCOR) 100 MG tablet Take 1 tablet (100 mg total) by mouth 2 (two) times daily.   FLUoxetine (PROZAC) 40 MG capsule TAKE 1 CAPSULE BY MOUTH TWICE A DAY   levothyroxine (SYNTHROID) 125 MCG tablet TAKE 1 TABLET (125 MCG TOTAL) BY  MOUTH DAILY BEFORE BREAKFAST.   omeprazole (PRILOSEC) 40 MG capsule TAKE 1 CAPSULE BY MOUTH TWICE A DAY   rosuvastatin (CRESTOR) 20 MG tablet Take 1 tablet (20 mg total) by mouth daily.   traZODone (DESYREL) 50 MG tablet Take 1.5 tablets (75 mg total) by mouth at bedtime.   XARELTO 20 MG TABS tablet TAKE 1 TABLET BY MOUTH DAILY WITH SUPPER.   No facility-administered encounter medications on file as of 10/27/2021.    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   Pre-diabetes    Past Surgical History:  Procedure Laterality Date   BREAST BIOPSY Left    needle core biopsy, benign   CATARACT EXTRACTION     HAND SURGERY     KNEE SURGERY     ROTATOR CUFF  REPAIR     TOTAL ABDOMINAL HYSTERECTOMY     Family History  Problem Relation Age of Onset   Atrial fibrillation Mother    Congestive Heart Failure Mother    Depression Mother    Heart disease Mother    Stroke Mother    Thyroid disease Mother    Anxiety disorder Mother    Obesity Mother    Heart disease Father    Alcohol abuse Father    High blood pressure Father    High Cholesterol Father    Alcoholism Father    Heart disease Brother    Heart disease Brother    Depression Sister    Depression Daughter    Social History   Socioeconomic History   Marital status: Married    Spouse name: John   Number of children: 1   Years of education: College   Highest education level: Not  on file  Occupational History   Occupation: Retired    Fish farm manager: OTHER    Comment: Radiographer, therapeutic  Tobacco Use   Smoking status: Never   Smokeless tobacco: Never  Vaping Use   Vaping Use: Never used  Substance and Sexual Activity   Alcohol use: Yes    Comment: rare   Drug use: No   Sexual activity: Not on file  Other Topics Concern   Not on file  Social History Narrative   Patient lives at home spouse.   Caffeine use: 2 sodas weekly   Right handed    Social Determinants of Health   Financial Resource Strain: Low Risk    Difficulty of Paying Living Expenses: Not hard at all  Food Insecurity: No Food Insecurity   Worried About Charity fundraiser in the Last Year: Never true   Ran Out of Food in the Last Year: Never true  Transportation Needs: No Transportation Needs   Lack of Transportation (Medical): No   Lack of Transportation (Non-Medical): No  Physical Activity: Not on file  Stress: No Stress Concern Present   Feeling of Stress : Only a little  Social Connections: Engineer, building services of Communication with Friends and Family: Once a week   Frequency of Social Gatherings with Friends and Family: More than three times a week   Attends Religious Services: More than 4 times per year   Active Member of [employer] or Organizations: Yes   Attends Music therapist: More than 4 times per year   Marital Status: Married    Tobacco Counseling Counseling given: Not Answered   Clinical Intake:  Pre-visit preparation completed: Yes        BMI - recorded: 29.4 Nutritional Status: BMI 25 -29 Overweight Nutritional Risks: None Diabetes: No  How often do you need to have someone help you when you read instructions, pamphlets, or other written materials from your doctor or pharmacy?: 1 - Never  Diabetic?No  Interpreter Needed?: No  Information entered by :: Caroleen Hamman LPN   Activities of Daily Living In your present state of health,  do you have any difficulty performing the following activities: 10/27/2021 12/22/2020  Hearing? Y N  Vision? N N  Difficulty concentrating or making decisions? Y N  Comment sees neurologist -  Walking or climbing stairs? N N  Dressing or bathing? N N  Doing errands, shopping? N N  Preparing Food and eating ? N -  Using the Toilet? N -  In the past six months, have you accidently leaked urine? Y -  Do you have problems with loss of bowel control? N -  Managing your Medications? N -  Managing your Finances? N -  Housekeeping or managing your Housekeeping? Y -  Some recent data might be hidden    Patient Care Team: Copland, Gay Filler, MD as PCP - General (Family Medicine) Stanford Breed Denice Bors, MD as PCP - Cardiology (Cardiology)  Indicate any recent Medical Services you may have received from other than [facility] providers in the past year (date may be approximate).     Assessment:   This is a routine wellness examination for Jadan.  Hearing/Vision screen Hearing Screening - Comments:: Has some hearing loss-has hearing aids but has lost them Vision Screening - Comments:: Last eye exam-within the past 6 months-Dr. Beverly Sessions  Dietary issues and exercise activities discussed: Current Exercise Habits: Home exercise routine, Type of exercise: walking, Intensity: Mild   Goals Addressed             This Visit's Progress    Patient Stated       Continue walking       Depression Screen PHQ 2/9 Scores 10/27/2021 06/30/2021 01/24/2021 01/05/2021 12/22/2020 06/17/2019 01/11/2018  PHQ - 2 Score 1 3 0 3 0 0 0  PHQ- 9 Score - 6 - 8 0 - -  Exception Documentation - - - - - - -    Fall Risk Fall Risk  10/27/2021 10/27/2021 02/14/2021 01/24/2021 12/22/2020  Falls in the past year? 1 0 1 0 1  Number falls in past yr: 1 0 1 0 1  Injury with Fall? 1 0 1 0 0  Risk for fall due to : History of fall(s);Impaired balance/gait Impaired mobility - - -  Risk for fall due to: Comment - - - - -  Follow up Falls  prevention discussed Falls evaluation completed - Falls evaluation completed Falls evaluation completed    [place]:  Any stairs in or around the home? Yes  If so, are there any without handrails? No  Home free of loose throw rugs in walkways, pet beds, electrical cords, etc? Yes  Adequate lighting in your home to reduce risk of falls? Yes   ASSISTIVE DEVICES UTILIZED TO PREVENT FALLS:  Life alert? No  Use of a cane, walker or w/c? Yes  Grab bars in the bathroom? No  Shower chair or bench in shower? Yes  Elevated toilet seat or a handicapped toilet? No   TIMED UP AND GO:  Was the test performed? Yes .  Length of time to ambulate 10 feet: 12 sec.   Gait slow and steady with assistive device  Cognitive Function:Patient sees neurology for mild memory impairment.  MMSE - Mini Mental State Exam 08/18/2019 10/14/2018 08/13/2017  Orientation to time 5 5 5   Orientation to Place 5 5 5   Registration 3 3 3   Attention/ Calculation 3 5 5   Recall 3 2 3   Language- name 2 objects 2 2 2   Language- repeat 1 1 1   Language- follow 3 step command 3 3 3   Language- read & follow direction 1 1 1   Write a sentence 1 1 1   Copy design 0 1 1  Total score 27 29 30         Immunizations Immunization History  Administered Date(s) Administered   Fluad Quad(high Dose 65+) 05/02/2019, 06/23/2020, 06/30/2021   Influenza, High Dose Seasonal PF 06/14/2016, 05/15/2018   Influenza,inj,Quad PF,6+ Mos 06/11/2014   Influenza,inj,quad, With Preservative 06/11/2017   PFIZER(Purple  Top)SARS-COV-2 Vaccination 10/03/2019, 10/24/2019, 06/17/2020   Pneumococcal Conjugate-13 08/07/2016   Pneumococcal Polysaccharide-23 01/28/2018   Tdap 01/28/2018    TDAP status: Up to date  Flu Vaccine status: Up to date  Pneumococcal vaccine status: Up to date  Covid-19 vaccine status: Completed vaccines per patient. Date unknown  Qualifies for Shingles Vaccine? Yes   Zostavax completed  No   Shingrix Completed?: No.    Education has been provided regarding the importance of this vaccine. Patient has been advised to call insurance company to determine out of pocket expense if they have not yet received this vaccine. Advised may also receive vaccine at local pharmacy or Health Dept. Verbalized acceptance and understanding.  Screening Tests Health Maintenance  Topic Date Due   COVID-19 Vaccine (4 - Booster for Pfizer series) 11/12/2021 (Originally 08/12/2020)   Zoster Vaccines- Shingrix (1 of 2) 01/24/2022 (Originally 04/22/1970)   MAMMOGRAM  12/28/2022   COLONOSCOPY (Pts 45-67yrs Insurance coverage will need to be confirmed)  09/12/2023   TETANUS/TDAP  01/29/2028   Pneumonia Vaccine 41+ Years old  Completed   INFLUENZA VACCINE  Completed   DEXA SCAN  Completed   Hepatitis C Screening  Completed   HPV VACCINES  Aged Out    Health Maintenance  There are no preventive care reminders to display for this patient.   Colorectal cancer screening: Type of screening: Colonoscopy. Completed 09/11/2013. Repeat every 10 years  Mammogram status: Completed bilateral 12/27/2020. Repeat every year  Bone Density status: Completed 12/27/2020. Results reflect: Bone density results: OSTEOPOROSIS. Repeat every 2 years.  Lung Cancer Screening: (Low Dose CT Chest recommended if Age 21-80 years, 30 pack-year currently smoking OR have quit w/in 15years.) does not qualify.     Additional Screening:  Hepatitis C Screening: Completed 01/22/2017  Vision Screening: Recommended annual ophthalmology exams for early detection of glaucoma and other disorders of the eye. Is the patient up to date with their annual eye exam?  Yes  Who is the provider or what is the name of the office in which the patient attends annual eye exams? Dr. Beverly Sessions   Dental Screening: Recommended annual dental exams for proper oral hygiene  Community Resource Referral / Chronic Care Management: CRR required this visit?  No    CCM required this visit?  No      Plan:     I have personally reviewed and noted the following in the patients chart:   Medical and social history Use of alcohol, tobacco or illicit drugs  Current medications and supplements including opioid prescriptions.  Functional ability and status Nutritional status Physical activity Advanced directives List of other physicians Hospitalizations, surgeries, and ER visits in previous 12 months Vitals Screenings to include cognitive, depression, and falls Referrals and appointments  In addition, I have reviewed and discussed with patient certain preventive protocols, quality metrics, and best practice recommendations. A written personalized care plan for preventive services as well as general preventive health recommendations were provided to patient.   Patient to access avs on mychart.  Marta Antu, LPN   [id number]  Nurse Health Advisor  Nurse Notes: None

## 2021-10-27 NOTE — Patient Instructions (Signed)
Lisa Acosta , Thank you for taking time to come for your Medicare Wellness Visit. I appreciate your ongoing commitment to your health goals. Please review the following plan we discussed and let me know if I can assist you in the future.   Screening recommendations/referrals: Colonoscopy: Completed 09/11/2013-Due-09/12/2023 Mammogram: Completed 12/27/2020-Due-12/27/2021 Bone Density: Completed 12/27/2020-Due-12/28/2022 Recommended yearly ophthalmology/optometry visit for glaucoma screening and checkup Recommended yearly dental visit for hygiene and checkup  Vaccinations: Influenza vaccine: Up to date Pneumococcal vaccine: Up to date Tdap vaccine: Up to date Shingles vaccine:May obtain vaccine at your local pharmacy. Covid-19:Up tp date  Advanced directives: Please bring a copy of Living Will and/or Healthcare Power of Attorney for your chart.   Conditions/risks identified: See problem list  Next appointment: Follow up in one year for your annual wellness visit    Preventive Care 65 Years and Older, Female Preventive care refers to lifestyle choices and visits with your health care provider that can promote health and wellness. What does preventive care include? A yearly physical exam. This is also called an annual well check. Dental exams once or twice a year. Routine eye exams. Ask your health care provider how often you should have your eyes checked. Personal lifestyle choices, including: Daily care of your teeth and gums. Regular physical activity. Eating a healthy diet. Avoiding tobacco and drug use. Limiting alcohol use. Practicing safe sex. Taking low-dose aspirin every day. Taking vitamin and mineral supplements as recommended by your health care provider. What happens during an annual well check? The services and screenings done by your health care provider during your annual well check will depend on your age, overall health, lifestyle risk factors, and family history of  disease. Counseling  Your health care provider may ask you questions about your: Alcohol use. Tobacco use. Drug use. Emotional well-being. Home and relationship well-being. Sexual activity. Eating habits. History of falls. Memory and ability to understand (cognition). Work and work Statistician. Reproductive health. Screening  You may have the following tests or measurements: Height, weight, and BMI. Blood pressure. Lipid and cholesterol levels. These may be checked every 5 years, or more frequently if you are over 48 years old. Skin check. Lung cancer screening. You may have this screening every year starting at age 76 if you have a 30-pack-year history of smoking and currently smoke or have quit within the past 15 years. Fecal occult blood test (FOBT) of the stool. You may have this test every year starting at age 42. Flexible sigmoidoscopy or colonoscopy. You may have a sigmoidoscopy every 5 years or a colonoscopy every 10 years starting at age 75. Hepatitis C blood test. Hepatitis B blood test. Sexually transmitted disease (STD) testing. Diabetes screening. This is done by checking your blood sugar (glucose) after you have not eaten for a while (fasting). You may have this done every 1-3 years. Bone density scan. This is done to screen for osteoporosis. You may have this done starting at age 46. Mammogram. This may be done every 1-2 years. Talk to your health care provider about how often you should have regular mammograms. Talk with your health care provider about your test results, treatment options, and if necessary, the need for more tests. Vaccines  Your health care provider may recommend certain vaccines, such as: Influenza vaccine. This is recommended every year. Tetanus, diphtheria, and acellular pertussis (Tdap, Td) vaccine. You may need a Td booster every 10 years. Zoster vaccine. You may need this after age 43. Pneumococcal 13-valent conjugate (PCV13)  vaccine. One  dose is recommended after age 26. Pneumococcal polysaccharide (PPSV23) vaccine. One dose is recommended after age 73. Talk to your health care provider about which screenings and vaccines you need and how often you need them. This information is not intended to replace advice given to you by your health care provider. Make sure you discuss any questions you have with your health care provider. Document Released: 09/24/2015 Document Revised: 05/17/2016 Document Reviewed: 06/29/2015 Elsevier Interactive Patient Education  2017 [employer] Prevention in the Home Falls can cause injuries. They can happen to people of all ages. There are many things you can do to make your home safe and to help prevent falls. What can I do on the outside of my home? Regularly fix the edges of walkways and driveways and fix any cracks. Remove anything that might make you trip as you walk through a door, such as a raised step or threshold. Trim any bushes or trees on the path to your home. Use bright outdoor lighting. Clear any walking paths of anything that might make someone trip, such as rocks or tools. Regularly check to see if handrails are loose or broken. Make sure that both sides of any steps have handrails. Any raised decks and porches should have guardrails on the edges. Have any leaves, snow, or ice cleared regularly. Use sand or salt on walking paths during winter. Clean up any spills in your garage right away. This includes oil or grease spills. What can I do in the bathroom? Use night lights. Install grab bars by the toilet and in the tub and shower. Do not use towel bars as grab bars. Use non-skid mats or decals in the tub or shower. If you need to sit down in the shower, use a plastic, non-slip stool. Keep the floor dry. Clean up any water that spills on the floor as soon as it happens. Remove soap buildup in the tub or shower regularly. Attach bath mats securely with double-sided  non-slip rug tape. Do not have throw rugs and other things on the floor that can make you trip. What can I do in the bedroom? Use night lights. Make sure that you have a light by your bed that is easy to reach. Do not use any sheets or blankets that are too big for your bed. They should not hang down onto the floor. Have a firm chair that has side arms. You can use this for support while you get dressed. Do not have throw rugs and other things on the floor that can make you trip. What can I do in the kitchen? Clean up any spills right away. Avoid walking on wet floors. Keep items that you use a lot in easy-to-reach places. If you need to reach something above you, use a strong step stool that has a grab bar. Keep electrical cords out of the way. Do not use floor polish or wax that makes floors slippery. If you must use wax, use non-skid floor wax. Do not have throw rugs and other things on the floor that can make you trip. What can I do with my stairs? Do not leave any items on the stairs. Make sure that there are handrails on both sides of the stairs and use them. Fix handrails that are broken or loose. Make sure that handrails are as long as the stairways. Check any carpeting to make sure that it is firmly attached to the stairs. Fix any carpet that is loose  or worn. Avoid having throw rugs at the top or bottom of the stairs. If you do have throw rugs, attach them to the floor with carpet tape. Make sure that you have a light switch at the top of the stairs and the bottom of the stairs. If you do not have them, ask someone to add them for you. What else can I do to help prevent falls? Wear shoes that: Do not have high heels. Have rubber bottoms. Are comfortable and fit you well. Are closed at the toe. Do not wear sandals. If you use a stepladder: Make sure that it is fully opened. Do not climb a closed stepladder. Make sure that both sides of the stepladder are locked into place. Ask  someone to hold it for you, if possible. Clearly mark and make sure that you can see: Any grab bars or handrails. First and last steps. Where the edge of each step is. Use tools that help you move around (mobility aids) if they are needed. These include: Canes. Walkers. Scooters. Crutches. Turn on the lights when you go into a dark area. Replace any light bulbs as soon as they burn out. Set up your furniture so you have a clear path. Avoid moving your furniture around. If any of your floors are uneven, fix them. If there are any pets around you, be aware of where they are. Review your medicines with your doctor. Some medicines can make you feel dizzy. This can increase your chance of falling. Ask your doctor what other things that you can do to help prevent falls. This information is not intended to replace advice given to you by your health care provider. Make sure you discuss any questions you have with your health care provider. Document Released: 06/24/2009 Document Revised: 02/03/2016 Document Reviewed: 10/02/2014 Elsevier Interactive Patient Education  2017 [employer].

## 2021-11-12 ENCOUNTER — Emergency Department (INDEPENDENT_AMBULATORY_CARE_PROVIDER_SITE_OTHER)
Admission: EM | Admit: 2021-11-12 | Discharge: 2021-11-12 | Disposition: A | Discharge status: Home / Self Care | Payer: Medicare Other | Source: Home / Self Care | Attending: Family Medicine | Admitting: Family Medicine

## 2021-11-12 ENCOUNTER — Other Ambulatory Visit: Payer: Self-pay

## 2021-11-12 ENCOUNTER — Encounter: Payer: Self-pay | Admitting: Emergency Medicine

## 2021-11-12 DIAGNOSIS — H9202 Otalgia, left ear: Secondary | ICD-10-CM | POA: Diagnosis not present

## 2021-11-12 NOTE — Discharge Instructions (Signed)
Continue allergy pill ?Add Flonase 2 times a day for a couple days then once a day until the allergy season has and ?May take Tylenol for mild pain.  Hydrocodone if pain is severe ?See your doctor if not improving by Monday ?

## 2021-11-12 NOTE — ED Provider Notes (Signed)
?[facility] ? ? ? ?CSN: [id number] ?Arrival date & time: 11/12/21  0936 ? ? ?  ? ?History   ?Chief Complaint ?Chief Complaint  ?Patient presents with  ? Otalgia  ?  Left  ? ? ?HPI ?Lisa Acosta is a 71 y.o. female.  ? ?HPI ? ?Patient is here for ear pain.  She has ear pain on the left.  Hearing is unchanged.  Minor cold symptoms and sore throat.  No fever. ?Recently saw her primary care doctor for ear pain on the right.  This was found to be cerumen impaction and external otitis ? ?Past Medical History:  ?Diagnosis Date  ? Atrial fibrillation ([place])   ? Back pain   ? Constipation   ? Depression   ? Diverticulitis   ? Diverticulitis   ? Diverticulosis   ? Esophageal ulcer   ? Fatty liver   ? Gait abnormality 10/14/2018  ? GERD (gastroesophageal reflux disease)   ? Hyperlipidemia   ? Hypertension   ? Hypothyroid   ? Memory difficulty 08/18/2019  ? OSA (obstructive sleep apnea) 08/31/2016  ? Osteopenia   ? 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 Pre-diabetes   ? ? ?Patient Active Problem List  ? Diagnosis Date Noted  ? Vitamin D insufficiency 01/10/2021  ? Memory difficulty 08/18/2019  ? Right wrist pain 05/04/2019  ? Mild episode of recurrent major depressive disorder ([place]) 12/11/2018  ? Depression, major, single episode, mild ([place]) 10/22/2018  ? Gait abnormality 10/14/2018  ? 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 Moderate episode of recurrent major depressive disorder ([place]) 09/26/2018  ? Generalized anxiety disorder 09/26/2018  ? Osteoporosis 01/25/2018  ? 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 Dyspnea 09/22/2016  ? OSA (obstructive sleep apnea) 08/31/2016  ? PAF (paroxysmal atrial fibrillation) ([place]) 03/16/2016  ? Hypothyroid 03/16/2016  ? Obesity (BMI 30-39.9) 03/16/2016  ? Essential hypertension 11/10/2015  ? Hyperlipidemia 11/10/2015  ? ? ?Past Surgical History:  ?Procedure Laterality Date  ? BREAST BIOPSY Left   ? needle core biopsy, benign  ? CATARACT EXTRACTION    ? HAND  SURGERY    ? KNEE SURGERY    ? ROTATOR CUFF REPAIR    ? TOTAL ABDOMINAL HYSTERECTOMY    ? ? ?OB History   ? ? Gravida  ?2  ? Para  ?1  ? Term  ?   ? Preterm  ?   ? AB  ?   ? Living  ?   ?  ? ? SAB  ?   ? IAB  ?   ? Ectopic  ?   ? Multiple  ?   ? Live Births  ?   ?   ?  ?  ? ? ? ?Home Medications   ? ?Prior to Admission medications   ?Medication Sig Start Date End Date Taking? Authorizing Provider  ?alendronate (FOSAMAX) 70 MG tablet Take 1 tablet (70 mg total) by mouth every 7 (seven) days. Take with a full glass of water on an empty stomach. 01/24/21  Yes Copland, Gay Filler, MD  ?amLODipine (NORVASC) 5 MG tablet TAKE 1 TABLET (5 MG TOTAL) BY MOUTH DAILY. 08/25/21   Copland, Gay Filler, MD  ?atenolol (TENORMIN) 50 MG tablet TAKE 1 TABLET BY MOUTH EVERY DAY 07/11/21   Copland, Gay Filler, MD  ?azithromycin (ZITHROMAX) 250 MG tablet 2 tabs po qd x 1 day; 1 tablet per day x 4 days; ?Patient not taking: Reported on 11/12/2021 10/27/21   Marrian Salvage,  FNP  ?flecainide (TAMBOCOR) 100 MG tablet Take 1 tablet (100 mg total) by mouth 2 (two) times daily. 10/27/20   Copland, Gay Filler, MD  ?FLUoxetine (PROZAC) 40 MG capsule TAKE 1 CAPSULE BY MOUTH TWICE A DAY 07/26/21   Copland, Gay Filler, MD  ?levothyroxine (SYNTHROID) 125 MCG tablet TAKE 1 TABLET (125 MCG TOTAL) BY MOUTH DAILY BEFORE BREAKFAST. 01/28/21   Copland, Gay Filler, MD  ?omeprazole (PRILOSEC) 40 MG capsule TAKE 1 CAPSULE BY MOUTH TWICE A DAY 07/21/21   Copland, Gay Filler, MD  ?rosuvastatin (CRESTOR) 20 MG tablet Take 1 tablet (20 mg total) by mouth daily. 12/20/20 12/15/21  Copland, Gay Filler, MD  ?traZODone (DESYREL) 50 MG tablet Take 1.5 tablets (75 mg total) by mouth at bedtime. 09/22/21   Copland, Gay Filler, MD  ?XARELTO 20 MG TABS tablet TAKE 1 TABLET BY MOUTH DAILY WITH SUPPER. 10/21/21   Copland, Gay Filler, MD  ? ? ?Family History ?Family History  ?Problem Relation Age of Onset  ? Atrial fibrillation Mother   ? Congestive Heart Failure Mother   ? Depression  Mother   ? Heart disease Mother   ? Stroke Mother   ? Thyroid disease Mother   ? Anxiety disorder Mother   ? Obesity Mother   ? Heart disease Father   ? Alcohol abuse Father   ? High blood pressure Father   ? High Cholesterol Father   ? Alcoholism Father   ? Depression Sister   ? Heart disease Brother   ? Heart disease Brother   ? Depression Daughter   ? ? ?Social History ?Social History  ? ?Tobacco Use  ? Smoking status: Never  ? Smokeless tobacco: Never  ?Vaping Use  ? Vaping Use: Never used  ?Substance Use Topics  ? Alcohol use: Yes  ?  Comment: rare  ? Drug use: No  ? ? ? ?Allergies   ?Dextran, Dextrans, Tree extract, Penicillin g, Penicillins, Sulfa antibiotics, and Sulfacetamide sodium ? ? ?Review of Systems ?Review of Systems ?See HPI ? ?Physical Exam ?Triage Vital Signs ?ED Triage Vitals  ?Enc Vitals Group  ?   BP 11/12/21 0952 132/78  ?   Pulse Rate 11/12/21 0952 (!) 52  ?   Resp 11/12/21 0952 16  ?   Temp 11/12/21 0952 98.1 ?F (36.7 ?C)  ?   Temp Source 11/12/21 0952 Oral  ?   SpO2 11/12/21 0952 98 %  ?   Weight 11/12/21 0956 153 lb (69.4 kg)  ?   Height 11/12/21 0956 '5\' 1"'$  (1.549 m)  ?   Head Circumference --   ?   Peak Flow --   ?   Pain Score 11/12/21 0956 6  ?   Pain Loc --   ?   Pain Edu? --   ?   Excl. in [place]? --   ? ?No data found. ? ?Updated Vital Signs ?BP 132/78 (BP Location: Left Arm)   Pulse (!) 52   Temp 98.1 ?F (36.7 ?C) (Oral)   Resp 16   Ht '5\' 1"'$  (1.549 m)   Wt 69.4 kg   SpO2 98%   BMI 28.91 kg/m?  ?  ? ?Physical Exam ?Constitutional:   ?   General: She is not in acute distress. ?   Appearance: Normal appearance. She is well-developed.  ?HENT:  ?   Head: Normocephalic and atraumatic.  ?   Right Ear: Tympanic membrane, ear canal and external ear normal. There is no impacted cerumen.  ?  Left Ear: Tympanic membrane, ear canal and external ear normal. There is no impacted cerumen.  ?   Ears:  ?   Comments: Both external ear canals and tympanic membranes appear normal ?   Nose: Nose  normal. No congestion.  ?   Mouth/Throat:  ?   Mouth: Mucous membranes are moist.  ?   Pharynx: No posterior oropharyngeal erythema.  ?Eyes:  ?   Conjunctiva/sclera: Conjunctivae normal.  ?   Pupils: Pupils are equal, round, and reactive to light.  ?Cardiovascular:  ?   Rate and Rhythm: Normal rate.  ?Pulmonary:  ?   Effort: Pulmonary effort is normal. No respiratory distress.  ?Abdominal:  ?   General: There is no distension.  ?   Palpations: Abdomen is soft.  ?Musculoskeletal:     ?   General: Normal range of motion.  ?   Cervical back: Normal range of motion.  ?Lymphadenopathy:  ?   Cervical: No cervical adenopathy.  ?Skin: ?   General: Skin is warm and dry.  ?Neurological:  ?   Mental Status: She is alert.  ?Psychiatric:     ?   Mood and Affect: Mood normal.     ?   Behavior: Behavior normal.  ? ? ? ?UC Treatments / Results  ?Labs ?(all labs ordered are listed, but only abnormal results are displayed) ?Labs Reviewed - No data to display ? ?EKG ? ? ?Radiology ?No results found. ? ?Procedures ?Procedures (including critical care time) ? ?Medications Ordered in UC ?Medications - No data to display ? ?Initial Impression / Assessment and Plan / UC Course  ?I have reviewed the triage vital signs and the nursing notes. ? ?Pertinent labs & imaging results that were available during my care of the patient were reviewed by me and considered in my medical decision making (see chart for details). ? ?  ? ?Possible eustachian tubeDysfunction.  Ear pain unclear etiology. ?Final Clinical Impressions(s) / UC Diagnoses  ? ?Final diagnoses:  ?Left ear pain  ? ? ? ?Discharge Instructions   ? ?  ?Continue allergy pill ?Add Flonase 2 times a day for a couple days then once a day until the allergy season has and ?May take Tylenol for mild pain.  Hydrocodone if pain is severe ?See your doctor if not improving by Monday ? ? ?ED Prescriptions   ?None ?  ? ?PDMP not reviewed this encounter. ?  ?Raylene Everts, MD ?11/12/21 1100 ? ?

## 2021-11-12 NOTE — ED Triage Notes (Signed)
Left ear pain x 24 hours  ?Recent impaction on right side w/ infection approx 2 weeks ago ? ?

## 2021-11-14 ENCOUNTER — Encounter: Payer: Self-pay | Admitting: Family Medicine

## 2021-11-14 DIAGNOSIS — E7800 Pure hypercholesterolemia, unspecified: Secondary | ICD-10-CM

## 2021-11-14 DIAGNOSIS — E038 Other specified hypothyroidism: Secondary | ICD-10-CM

## 2021-11-14 DIAGNOSIS — E559 Vitamin D deficiency, unspecified: Secondary | ICD-10-CM

## 2021-11-14 DIAGNOSIS — I10 Essential (primary) hypertension: Secondary | ICD-10-CM

## 2021-11-16 ENCOUNTER — Encounter: Payer: Self-pay | Admitting: Family Medicine

## 2021-11-16 ENCOUNTER — Other Ambulatory Visit: Payer: Self-pay | Admitting: Family Medicine

## 2021-11-16 ENCOUNTER — Other Ambulatory Visit (INDEPENDENT_AMBULATORY_CARE_PROVIDER_SITE_OTHER): Payer: Medicare Other

## 2021-11-16 DIAGNOSIS — E559 Vitamin D deficiency, unspecified: Secondary | ICD-10-CM

## 2021-11-16 DIAGNOSIS — R7303 Prediabetes: Secondary | ICD-10-CM | POA: Diagnosis not present

## 2021-11-16 DIAGNOSIS — E7800 Pure hypercholesterolemia, unspecified: Secondary | ICD-10-CM

## 2021-11-16 DIAGNOSIS — Z20828 Contact with and (suspected) exposure to other viral communicable diseases: Secondary | ICD-10-CM | POA: Diagnosis not present

## 2021-11-16 DIAGNOSIS — I10 Essential (primary) hypertension: Secondary | ICD-10-CM | POA: Diagnosis not present

## 2021-11-16 DIAGNOSIS — E038 Other specified hypothyroidism: Secondary | ICD-10-CM | POA: Diagnosis not present

## 2021-11-16 DIAGNOSIS — R799 Abnormal finding of blood chemistry, unspecified: Secondary | ICD-10-CM

## 2021-11-16 LAB — LIPID PANEL
Cholesterol: 180 mg/dL (ref 0–200)
HDL: 65.1 mg/dL (ref >39.00)
LDL Cholesterol: 93 mg/dL (ref 0–99)
NonHDL: 114.97
Total CHOL/HDL Ratio: 3
Triglycerides: 108 mg/dL (ref 0.0–149.0)
VLDL: 21.6 mg/dL (ref 0.0–40.0)

## 2021-11-16 LAB — BASIC METABOLIC PANEL
BUN: 19 mg/dL (ref 6–23)
CO2: 32 mEq/L (ref 19–32)
Calcium: 9.3 mg/dL (ref 8.4–10.5)
Chloride: 103 mEq/L (ref 96–112)
Creatinine, Ser: 0.57 mg/dL (ref 0.40–1.20)
GFR: 91.33 mL/min (ref >60.00)
Glucose, Bld: 99 mg/dL (ref 70–99)
Potassium: 4 mEq/L (ref 3.5–5.1)
Sodium: 140 mEq/L (ref 135–145)

## 2021-11-16 LAB — TSH: TSH: 0.86 u[IU]/mL (ref 0.35–5.50)

## 2021-11-16 LAB — HEMOGLOBIN A1C: Hgb A1c MFr Bld: 5.6 % (ref 4.6–6.5)

## 2021-11-16 LAB — VITAMIN D 25 HYDROXY (VIT D DEFICIENCY, FRACTURES): VITD: 79.1 ng/mL (ref 30.00–100.00)

## 2021-11-21 ENCOUNTER — Other Ambulatory Visit: Payer: Self-pay

## 2021-11-21 ENCOUNTER — Encounter (INDEPENDENT_AMBULATORY_CARE_PROVIDER_SITE_OTHER): Payer: Self-pay | Admitting: Family Medicine

## 2021-11-21 ENCOUNTER — Ambulatory Visit (INDEPENDENT_AMBULATORY_CARE_PROVIDER_SITE_OTHER): Payer: Medicare Other | Admitting: Family Medicine

## 2021-11-21 VITALS — BP 120/60 | HR 48 | Temp 98.4°F | Ht 61.0 in | Wt 152.0 lb

## 2021-11-21 DIAGNOSIS — E7849 Other hyperlipidemia: Secondary | ICD-10-CM

## 2021-11-21 DIAGNOSIS — Z6828 Body mass index (BMI) 28.0-28.9, adult: Secondary | ICD-10-CM | POA: Diagnosis not present

## 2021-11-21 DIAGNOSIS — E038 Other specified hypothyroidism: Secondary | ICD-10-CM

## 2021-11-21 DIAGNOSIS — E669 Obesity, unspecified: Secondary | ICD-10-CM

## 2021-11-22 NOTE — Progress Notes (Signed)
? ? ? ?Chief Complaint:  ? ?OBESITY ?Lisa Acosta is here to discuss her progress with her obesity treatment plan along with follow-up of her obesity related diagnoses. Lisa Acosta is on the Category 1 Plan and states she is following her eating plan approximately 60% of the time. Lisa Acosta states she is walking 1 mile 2 times per week.  ? ?Today's visit was #: 12 ?Starting weight: 198 lbs ?Starting date: 01/02/2021 ?Today's weight: 152 lbs ?Today's date: 11/21/2021 ?Total lbs lost to date: 23 ?Total lbs lost since last in-office visit: 0 ? ?Interim History: Lisa Acosta had a difficult few weeks, and she hasn't done any walking and her sweet tooth is reemerging. No longer feeling satisfied from [employer]. She does crave chocolate. She notes some hunger in the last few weeks, more so than previously. She is getting knee surgery in the upcoming few days. ? ?Subjective:  ? ?1. Other hyperlipidemia ?Lisa Acosta's last LDL was 93, HDL 65, and triglycerides 108. She is on Crestor.  ? ?2. Other specified hypothyroidism ?Lisa Acosta is on levothyroxine. She denies hot or cold intolerance, or palpitations. ? ?Assessment/Plan:  ? ?1. Other hyperlipidemia ?Lisa Acosta will continue Crestor, with no change in dose. ? ?2. Other specified hypothyroidism ?Lisa Acosta will continue levothyroxine, with no change in dose. ? ?3. Obesity with current BMI of 28.8 ?Lisa Acosta is currently in the action stage of change. As such, her goal is to continue with weight loss efforts. She has agreed to the Category 2 Plan with 6 oz of meat at supper.  ? ?Exercise goals: All adults should avoid inactivity. Some physical activity is better than none, and adults who participate in any amount of physical activity gain some health benefits. ? ?Behavioral modification strategies: increasing lean protein intake, meal planning and cooking strategies, keeping healthy foods in the home, and planning for success. ? ?Lisa Acosta has agreed to follow-up with our clinic in 4 to 5 weeks. She was informed of the  importance of frequent follow-up visits to maximize her success with intensive lifestyle modifications for her multiple health conditions.  ? ?Objective:  ? ?Blood pressure 120/60, pulse (!) 48, temperature 98.4 ?F (36.9 ?C), height '5\' 1"'$  (1.549 m), weight 152 lb (68.9 kg), SpO2 97 %. ?Body mass index is 28.72 kg/m?. ? ?General: Cooperative, alert, well developed, in no acute distress. ?HEENT: Conjunctivae and lids unremarkable. ?Cardiovascular: Regular rhythm.  ?Lungs: Normal work of breathing. ?Neurologic: No focal deficits.  ? ?Lab Results  ?Component Value Date  ? CREATININE 0.57 11/16/2021  ? BUN 19 11/16/2021  ? NA 140 11/16/2021  ? K 4.0 11/16/2021  ? CL 103 11/16/2021  ? CO2 32 11/16/2021  ? ?Lab Results  ?Component Value Date  ? ALT 46 (H) 06/30/2021  ? AST 36 06/30/2021  ? ALKPHOS 81 06/30/2021  ? BILITOT 0.7 06/30/2021  ? ?Lab Results  ?Component Value Date  ? HGBA1C 5.6 11/16/2021  ? HGBA1C 5.7 06/30/2021  ? HGBA1C 6.2 (H) 01/05/2021  ? HGBA1C 5.9 06/23/2019  ? HGBA1C 5.8 04/15/2018  ? ?Lab Results  ?Component Value Date  ? INSULIN 20.4 01/05/2021  ? ?Lab Results  ?Component Value Date  ? TSH 0.86 11/16/2021  ? ?Lab Results  ?Component Value Date  ? CHOL 180 11/16/2021  ? HDL 65.10 11/16/2021  ? [place] 93 11/16/2021  ? LDLDIRECT 112.0 01/22/2017  ? TRIG 108.0 11/16/2021  ? CHOLHDL 3 11/16/2021  ? ?Lab Results  ?Component Value Date  ? VD25OH 79.10 11/16/2021  ? VD25OH 25.0 (L) 01/05/2021  ? ?  Lab Results  ?Component Value Date  ? WBC 17.2 (H) 06/30/2021  ? HGB 13.4 06/30/2021  ? HCT 41.1 06/30/2021  ? MCV 88.1 06/30/2021  ? PLT 221.0 06/30/2021  ? ?No results found for: IRON, TIBC, FERRITIN ? ?Attestation Statements:  ? ?Reviewed by clinician on day of visit: allergies, medications, problem list, medical history, surgical history, family history, social history, and previous encounter notes. ? ? ?I, Trixie Dredge, am acting as transcriptionist for Coralie Common, MD. ? ?I have reviewed the above  documentation for accuracy and completeness, and I agree with the above. Coralie Common, MD ? ? ?

## 2021-11-24 DIAGNOSIS — S83231A Complex tear of medial meniscus, current injury, right knee, initial encounter: Secondary | ICD-10-CM | POA: Diagnosis not present

## 2021-11-24 DIAGNOSIS — S83281A Other tear of lateral meniscus, current injury, right knee, initial encounter: Secondary | ICD-10-CM | POA: Diagnosis not present

## 2021-11-24 DIAGNOSIS — M94261 Chondromalacia, right knee: Secondary | ICD-10-CM | POA: Diagnosis not present

## 2021-11-24 DIAGNOSIS — M94262 Chondromalacia, left knee: Secondary | ICD-10-CM | POA: Diagnosis not present

## 2021-11-24 DIAGNOSIS — G8918 Other acute postprocedural pain: Secondary | ICD-10-CM | POA: Diagnosis not present

## 2021-11-24 DIAGNOSIS — Y999 Unspecified external cause status: Secondary | ICD-10-CM | POA: Diagnosis not present

## 2021-11-24 DIAGNOSIS — X58XXXA Exposure to other specified factors, initial encounter: Secondary | ICD-10-CM | POA: Diagnosis not present

## 2021-11-24 DIAGNOSIS — S83282A Other tear of lateral meniscus, current injury, left knee, initial encounter: Secondary | ICD-10-CM | POA: Diagnosis not present

## 2021-11-27 ENCOUNTER — Other Ambulatory Visit: Payer: Self-pay | Admitting: Medical

## 2021-12-01 DIAGNOSIS — M545 Low back pain, unspecified: Secondary | ICD-10-CM | POA: Diagnosis not present

## 2021-12-01 DIAGNOSIS — G8918 Other acute postprocedural pain: Secondary | ICD-10-CM | POA: Diagnosis not present

## 2021-12-01 DIAGNOSIS — Z7409 Other reduced mobility: Secondary | ICD-10-CM | POA: Diagnosis not present

## 2021-12-05 DIAGNOSIS — Z20822 Contact with and (suspected) exposure to covid-19: Secondary | ICD-10-CM | POA: Diagnosis not present

## 2021-12-06 DIAGNOSIS — Z7409 Other reduced mobility: Secondary | ICD-10-CM | POA: Diagnosis not present

## 2021-12-06 DIAGNOSIS — Z4789 Encounter for other orthopedic aftercare: Secondary | ICD-10-CM | POA: Diagnosis not present

## 2021-12-06 DIAGNOSIS — M545 Low back pain, unspecified: Secondary | ICD-10-CM | POA: Diagnosis not present

## 2021-12-06 DIAGNOSIS — M25561 Pain in right knee: Secondary | ICD-10-CM | POA: Diagnosis not present

## 2021-12-08 DIAGNOSIS — Z20822 Contact with and (suspected) exposure to covid-19: Secondary | ICD-10-CM | POA: Diagnosis not present

## 2021-12-08 DIAGNOSIS — Z7409 Other reduced mobility: Secondary | ICD-10-CM | POA: Diagnosis not present

## 2021-12-08 DIAGNOSIS — Z4889 Encounter for other specified surgical aftercare: Secondary | ICD-10-CM | POA: Diagnosis not present

## 2021-12-08 DIAGNOSIS — M545 Low back pain, unspecified: Secondary | ICD-10-CM | POA: Diagnosis not present

## 2021-12-08 DIAGNOSIS — G8918 Other acute postprocedural pain: Secondary | ICD-10-CM | POA: Diagnosis not present

## 2021-12-09 ENCOUNTER — Other Ambulatory Visit: Payer: Self-pay | Admitting: Family Medicine

## 2021-12-09 DIAGNOSIS — I480 Paroxysmal atrial fibrillation: Secondary | ICD-10-CM

## 2021-12-13 DIAGNOSIS — Z7409 Other reduced mobility: Secondary | ICD-10-CM | POA: Diagnosis not present

## 2021-12-13 DIAGNOSIS — M545 Low back pain, unspecified: Secondary | ICD-10-CM | POA: Diagnosis not present

## 2021-12-13 DIAGNOSIS — Z4789 Encounter for other orthopedic aftercare: Secondary | ICD-10-CM | POA: Diagnosis not present

## 2021-12-15 DIAGNOSIS — Z20822 Contact with and (suspected) exposure to covid-19: Secondary | ICD-10-CM | POA: Diagnosis not present

## 2021-12-15 DIAGNOSIS — Z7409 Other reduced mobility: Secondary | ICD-10-CM | POA: Diagnosis not present

## 2021-12-15 DIAGNOSIS — M545 Low back pain, unspecified: Secondary | ICD-10-CM | POA: Diagnosis not present

## 2021-12-15 DIAGNOSIS — Z4789 Encounter for other orthopedic aftercare: Secondary | ICD-10-CM | POA: Diagnosis not present

## 2021-12-15 DIAGNOSIS — G8918 Other acute postprocedural pain: Secondary | ICD-10-CM | POA: Diagnosis not present

## 2021-12-17 ENCOUNTER — Other Ambulatory Visit: Payer: Self-pay | Admitting: Family Medicine

## 2021-12-17 DIAGNOSIS — E7800 Pure hypercholesterolemia, unspecified: Secondary | ICD-10-CM

## 2021-12-19 DIAGNOSIS — M545 Low back pain, unspecified: Secondary | ICD-10-CM | POA: Diagnosis not present

## 2021-12-19 DIAGNOSIS — Z7409 Other reduced mobility: Secondary | ICD-10-CM | POA: Diagnosis not present

## 2021-12-19 DIAGNOSIS — M25561 Pain in right knee: Secondary | ICD-10-CM | POA: Diagnosis not present

## 2021-12-19 DIAGNOSIS — Z4789 Encounter for other orthopedic aftercare: Secondary | ICD-10-CM | POA: Diagnosis not present

## 2021-12-26 DIAGNOSIS — Z20822 Contact with and (suspected) exposure to covid-19: Secondary | ICD-10-CM | POA: Diagnosis not present

## 2021-12-27 DIAGNOSIS — F321 Major depressive disorder, single episode, moderate: Secondary | ICD-10-CM | POA: Diagnosis not present

## 2021-12-27 NOTE — Progress Notes (Addendum)
Therapist, music at [facility] [employer] ?[address], [address] ?[place], [place] [address] ?336 [phone] ?Fax [phone] ? ?Date:  12/29/2021  ? ?Name:  Adiah Guereca   DOB:  02-Mar-1951   MRN:  [id number] ? ?PCP:  Darreld Mclean, MD  ? ? ?Chief Complaint: 6 month follow up (Concerns/ questions: 1. pt would like a new sleep study, her cpap maching is old. 2. She would like her ears checked. /) ? ? ?History of Present Illness: ? ?Lisa Acosta is a 71 y.o. very pleasant female patient who presents with the following: ? ?Patient seen today for periodic follow-up ?Most recent visit with myself was in October ?History of depression, atrial fibrillation, osteoarthritis, osteoporosis, hypothyroidism, hyperlipidemia ? ?At her last visit she was working with the [facility] center and has lost about 35 pounds.  She was walking and doing water aerobics ?She was also seeing a counselor for depression, was worried about her memory-we discussed recent neurocognitive testing which suggested her memory issues were actually due to depression ?She uses atenolol as needed for atrial fib-is also taking Xarelto ? ?COVID booster ?BMP, lipids, vitamin D done in March ?Need to follow-up on CBC today-CBC in October showed significant leukocytosis-perhaps due to recent steroid shot ?Elevated BUN in October returned to normal in March ? ?She has had her current CPAP machine for a long time. It seems to be working strangely recently.  Also, we wonder if she still has sleep apnea as she has lost weight  ?Will arrange for a repeat sleep study for her  ? ?She did have RIGHT knee surgery in March- meniscal tear surgery ?Unfortunately she does not feel like her knee is much better  ? ?Wt Readings from Last 3 Encounters:  ?12/29/21 158 lb 6.4 oz (71.8 kg)  ?11/21/21 152 lb (68.9 kg)  ?11/12/21 153 lb (69.4 kg)  ? ? ?Lab Results  ?Component Value Date  ? HGBA1C 5.6 11/16/2021  ? ? ?Lab Results  ?Component Value Date   ? TSH 0.86 11/16/2021  ? ? ?Patient Active Problem List  ? Diagnosis Date Noted  ? Vitamin D insufficiency 01/10/2021  ? Memory difficulty 08/18/2019  ? Right wrist pain 05/04/2019  ? Mild episode of recurrent major depressive disorder ([place]) 12/11/2018  ? Depression, major, single episode, mild ([place]) 10/22/2018  ? Gait abnormality 10/14/2018  ? 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 Moderate episode of recurrent major depressive disorder ([place]) 09/26/2018  ? Generalized anxiety disorder 09/26/2018  ? Osteoporosis 01/25/2018  ? 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 Dyspnea 09/22/2016  ? OSA (obstructive sleep apnea) 08/31/2016  ? PAF (paroxysmal atrial fibrillation) ([place]) 03/16/2016  ? Hypothyroid 03/16/2016  ? Obesity (BMI 30-39.9) 03/16/2016  ? Essential hypertension 11/10/2015  ? Hyperlipidemia 11/10/2015  ? ? ?Past Medical History:  ?Diagnosis Date  ? Atrial fibrillation ([place])   ? Back pain   ? Constipation   ? Depression   ? Diverticulitis   ? Diverticulitis   ? Diverticulosis   ? Esophageal ulcer   ? Fatty liver   ? Gait abnormality 10/14/2018  ? GERD (gastroesophageal reflux disease)   ? Hyperlipidemia   ? Hypertension   ? Hypothyroid   ? Memory difficulty 08/18/2019  ? OSA (obstructive sleep apnea) 08/31/2016  ? Osteopenia   ? 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 Pre-diabetes   ? ? ?Past Surgical History:  ?Procedure Laterality Date  ? BREAST BIOPSY Left   ? needle  core biopsy, benign  ? CATARACT EXTRACTION    ? HAND SURGERY    ? KNEE SURGERY    ? ROTATOR CUFF REPAIR    ? TOTAL ABDOMINAL HYSTERECTOMY    ? ? ?Social History  ? ?Tobacco Use  ? Smoking status: Never  ? Smokeless tobacco: Never  ?Vaping Use  ? Vaping Use: Never used  ?Substance Use Topics  ? Alcohol use: Yes  ?  Comment: rare  ? Drug use: No  ? ? ?Family History  ?Problem Relation Age of Onset  ? Atrial fibrillation Mother   ? Congestive Heart Failure Mother   ? Depression Mother   ? Heart disease Mother   ? Stroke Mother   ?  Thyroid disease Mother   ? Anxiety disorder Mother   ? Obesity Mother   ? Heart disease Father   ? Alcohol abuse Father   ? High blood pressure Father   ? High Cholesterol Father   ? Alcoholism Father   ? Depression Sister   ? Heart disease Brother   ? Heart disease Brother   ? Depression Daughter   ? ? ?Allergies  ?Allergen Reactions  ? Dextran Anaphylaxis  ? Dextrans   ? Tree Extract   ? Penicillin G Rash  ? Penicillins Rash  ? Sulfa Antibiotics Rash  ? Sulfacetamide Sodium Rash  ? ? ?Medication list has been reviewed and updated. ? ?Current Outpatient Medications on File Prior to Visit  ?Medication Sig Dispense Refill  ? alendronate (FOSAMAX) 70 MG tablet Take 1 tablet (70 mg total) by mouth every 7 (seven) days. Take with a full glass of water on an empty stomach. 12 tablet 3  ? flecainide (TAMBOCOR) 100 MG tablet TAKE 1 TABLET BY MOUTH 2 TIMES DAILY 180 tablet 3  ? fluticasone (FLONASE) 50 MCG/ACT nasal spray SPRAY 2 SPRAYS INTO EACH NOSTRIL EVERY DAY 48 mL 1  ? levothyroxine (SYNTHROID) 125 MCG tablet TAKE 1 TABLET (125 MCG TOTAL) BY MOUTH DAILY BEFORE BREAKFAST. 90 tablet 3  ? omeprazole (PRILOSEC) 40 MG capsule TAKE 1 CAPSULE BY MOUTH TWICE A DAY 180 capsule 3  ? rosuvastatin (CRESTOR) 20 MG tablet TAKE 1 TABLET BY MOUTH EVERY DAY 90 tablet 3  ? traZODone (DESYREL) 50 MG tablet Take 1.5 tablets (75 mg total) by mouth at bedtime. 135 tablet 1  ? XARELTO 20 MG TABS tablet TAKE 1 TABLET BY MOUTH DAILY WITH SUPPER. 90 tablet 1  ? ?No current facility-administered medications on file prior to visit.  ? ? ?Review of Systems: ? ?As per HPI- otherwise negative. ? ? ?Physical Examination: ?Vitals:  ? 12/29/21 1023  ?BP: 122/80  ?Pulse: (!) 51  ?Resp: 18  ?Temp: 98.4 ?F (36.9 ?C)  ?SpO2: 95%  ? ?Vitals:  ? 12/29/21 1023  ?Weight: 158 lb 6.4 oz (71.8 kg)  ?Height: '5\' 1"'$  (1.549 m)  ? ?Body mass index is 29.93 kg/m?. ?Ideal Body Weight: Weight in (lb) to have BMI = 25: 132 ? ?GEN: no acute distress. Overweight, looks  well  ?HEENT: Atraumatic, Normocephalic.  ?Ears and Nose: No external deformity. ?CV: RRR, No M/G/R. No JVD. No thrill. No extra heart sounds. ?PULM: CTA B, no wheezes, crackles, rhonchi. No retractions. No resp. distress. No accessory muscle use. ?ABD: S, NT, ND, +BS. No rebound. No HSM. ?EXTR: No c/c/e ?PSYCH: Normally interactive. Conversant.  ? ?Pulse Readings from Last 3 Encounters:  ?12/29/21 (!) 51  ?11/21/21 (!) 48  ?11/12/21 (!) 52  ? ? ?Assessment and  Plan: ?Leukocytosis, unspecified type - Plan: CBC ? ?OSA (obstructive sleep apnea) - Plan: Ambulatory referral to Neurology ? ?Paroxysmal atrial fibrillation (HCC) - Plan: amLODipine (NORVASC) 5 MG tablet ? ?Palpitations - Plan: atenolol (TENORMIN) 50 MG tablet ? ?Depression, major, single episode, mild (HCC) - Plan: FLUoxetine (PROZAC) 40 MG capsule ? ?Seen today for follow-up.  We will recheck white blood cell count, elevated previously which may have been due to to recent steroid shot for her knees ?She continues to take atenolol for rate control, mild bradycardia stable.  Sinus rhythm today ?Diagnosis sleep apnea years ago, has been using her current CPAP machine for about a decade ?She may need a new machine.  However, given her recent weight loss we also wonder if she still has sleep.  Referral to neurology for testing ?Depression is much better with fluoxetine, she continues to take 80 mg daily.  She might want to try decreasing to 40 which is fine ? ?Recheck 6 months assuming all is well ? ?Signed ?Lamar Blinks, MD ? ?Received labs as below, message to patient ? ?Results for orders placed or performed in visit on 12/29/21  ?CBC  ?Result Value Ref Range  ? WBC 6.7 4.0 - 10.5 K/uL  ? RBC 4.60 3.87 - 5.11 Mil/uL  ? Platelets 251.0 150.0 - 400.0 K/uL  ? Hemoglobin 13.6 12.0 - 15.0 g/dL  ? HCT 41.2 36.0 - 46.0 %  ? MCV 89.6 78.0 - 100.0 fl  ? MCHC 32.9 30.0 - 36.0 g/dL  ? RDW 13.7 11.5 - 15.5 %  ? ? ? ?

## 2021-12-27 NOTE — Patient Instructions (Addendum)
It was great to see you again today!  ?Consider COVID booster if due ?I will be in touch with your labs asap- we will just recheck on your white cell count ?We will also set you up to see neurology - we will see if you still have sleep apnea  ? ?

## 2021-12-28 ENCOUNTER — Ambulatory Visit (INDEPENDENT_AMBULATORY_CARE_PROVIDER_SITE_OTHER): Payer: Medicare Other | Admitting: Family Medicine

## 2021-12-29 ENCOUNTER — Ambulatory Visit (INDEPENDENT_AMBULATORY_CARE_PROVIDER_SITE_OTHER): Payer: Medicare Other | Admitting: Family Medicine

## 2021-12-29 ENCOUNTER — Encounter: Payer: Self-pay | Admitting: Family Medicine

## 2021-12-29 VITALS — BP 122/80 | HR 51 | Temp 98.4°F | Resp 18 | Ht 61.0 in | Wt 158.4 lb

## 2021-12-29 DIAGNOSIS — D72829 Elevated white blood cell count, unspecified: Secondary | ICD-10-CM | POA: Diagnosis not present

## 2021-12-29 DIAGNOSIS — F320 Major depressive disorder, single episode, mild: Secondary | ICD-10-CM | POA: Diagnosis not present

## 2021-12-29 DIAGNOSIS — Z7409 Other reduced mobility: Secondary | ICD-10-CM | POA: Diagnosis not present

## 2021-12-29 DIAGNOSIS — Z4789 Encounter for other orthopedic aftercare: Secondary | ICD-10-CM | POA: Diagnosis not present

## 2021-12-29 DIAGNOSIS — G4733 Obstructive sleep apnea (adult) (pediatric): Secondary | ICD-10-CM | POA: Diagnosis not present

## 2021-12-29 DIAGNOSIS — R002 Palpitations: Secondary | ICD-10-CM | POA: Diagnosis not present

## 2021-12-29 DIAGNOSIS — I480 Paroxysmal atrial fibrillation: Secondary | ICD-10-CM

## 2021-12-29 LAB — CBC
HCT: 41.2 % (ref 36.0–46.0)
Hemoglobin: 13.6 g/dL (ref 12.0–15.0)
MCHC: 32.9 g/dL (ref 30.0–36.0)
MCV: 89.6 fl (ref 78.0–100.0)
Platelets: 251 10*3/uL (ref 150.0–400.0)
RBC: 4.6 Mil/uL (ref 3.87–5.11)
RDW: 13.7 % (ref 11.5–15.5)
WBC: 6.7 10*3/uL (ref 4.0–10.5)

## 2021-12-29 MED ORDER — FLUOXETINE HCL 40 MG PO CAPS: 40.0000 mg | ORAL_CAPSULE | Freq: Two times a day (BID) | ORAL | 3 refills | Status: DC

## 2021-12-29 MED ORDER — ATENOLOL 50 MG PO TABS: 50.0000 mg | ORAL_TABLET | Freq: Every day | ORAL | 3 refills | Status: DC

## 2021-12-29 MED ORDER — AMLODIPINE BESYLATE 5 MG PO TABS: 5.0000 mg | ORAL_TABLET | Freq: Every day | ORAL | 3 refills | Status: DC

## 2022-01-05 DIAGNOSIS — Z7409 Other reduced mobility: Secondary | ICD-10-CM | POA: Diagnosis not present

## 2022-01-05 DIAGNOSIS — M545 Low back pain, unspecified: Secondary | ICD-10-CM | POA: Diagnosis not present

## 2022-01-05 DIAGNOSIS — Z4789 Encounter for other orthopedic aftercare: Secondary | ICD-10-CM | POA: Diagnosis not present

## 2022-01-05 DIAGNOSIS — M25561 Pain in right knee: Secondary | ICD-10-CM | POA: Diagnosis not present

## 2022-01-08 ENCOUNTER — Encounter: Payer: Self-pay | Admitting: Family Medicine

## 2022-01-10 DIAGNOSIS — Z20822 Contact with and (suspected) exposure to covid-19: Secondary | ICD-10-CM | POA: Diagnosis not present

## 2022-01-11 DIAGNOSIS — Z4789 Encounter for other orthopedic aftercare: Secondary | ICD-10-CM | POA: Diagnosis not present

## 2022-01-11 DIAGNOSIS — M25561 Pain in right knee: Secondary | ICD-10-CM | POA: Diagnosis not present

## 2022-01-11 DIAGNOSIS — Z7409 Other reduced mobility: Secondary | ICD-10-CM | POA: Diagnosis not present

## 2022-01-11 DIAGNOSIS — M545 Low back pain, unspecified: Secondary | ICD-10-CM | POA: Diagnosis not present

## 2022-01-13 DIAGNOSIS — G8918 Other acute postprocedural pain: Secondary | ICD-10-CM | POA: Diagnosis not present

## 2022-01-13 DIAGNOSIS — M545 Low back pain, unspecified: Secondary | ICD-10-CM | POA: Diagnosis not present

## 2022-01-13 DIAGNOSIS — Z7409 Other reduced mobility: Secondary | ICD-10-CM | POA: Diagnosis not present

## 2022-01-13 DIAGNOSIS — Z20822 Contact with and (suspected) exposure to covid-19: Secondary | ICD-10-CM | POA: Diagnosis not present

## 2022-01-14 DIAGNOSIS — Z20828 Contact with and (suspected) exposure to other viral communicable diseases: Secondary | ICD-10-CM | POA: Diagnosis not present

## 2022-01-14 DIAGNOSIS — Z20822 Contact with and (suspected) exposure to covid-19: Secondary | ICD-10-CM | POA: Diagnosis not present

## 2022-01-16 DIAGNOSIS — Z20822 Contact with and (suspected) exposure to covid-19: Secondary | ICD-10-CM | POA: Diagnosis not present

## 2022-01-16 DIAGNOSIS — I480 Paroxysmal atrial fibrillation: Secondary | ICD-10-CM | POA: Diagnosis not present

## 2022-01-16 DIAGNOSIS — I700 Atherosclerosis of aorta: Secondary | ICD-10-CM | POA: Diagnosis not present

## 2022-01-16 DIAGNOSIS — Z79899 Other long term (current) drug therapy: Secondary | ICD-10-CM | POA: Diagnosis not present

## 2022-01-16 DIAGNOSIS — Z5181 Encounter for therapeutic drug level monitoring: Secondary | ICD-10-CM | POA: Diagnosis not present

## 2022-01-18 DIAGNOSIS — L821 Other seborrheic keratosis: Secondary | ICD-10-CM | POA: Diagnosis not present

## 2022-01-24 ENCOUNTER — Ambulatory Visit (INDEPENDENT_AMBULATORY_CARE_PROVIDER_SITE_OTHER): Payer: Medicare Other | Admitting: Nurse Practitioner

## 2022-01-25 DIAGNOSIS — Z4789 Encounter for other orthopedic aftercare: Secondary | ICD-10-CM | POA: Diagnosis not present

## 2022-01-25 DIAGNOSIS — G8918 Other acute postprocedural pain: Secondary | ICD-10-CM | POA: Diagnosis not present

## 2022-01-25 DIAGNOSIS — M545 Low back pain, unspecified: Secondary | ICD-10-CM | POA: Diagnosis not present

## 2022-01-25 DIAGNOSIS — Z7409 Other reduced mobility: Secondary | ICD-10-CM | POA: Diagnosis not present

## 2022-02-10 ENCOUNTER — Other Ambulatory Visit: Payer: Self-pay | Admitting: Family Medicine

## 2022-02-10 DIAGNOSIS — E034 Atrophy of thyroid (acquired): Secondary | ICD-10-CM

## 2022-02-13 DIAGNOSIS — F324 Major depressive disorder, single episode, in partial remission: Secondary | ICD-10-CM | POA: Diagnosis not present

## 2022-02-14 ENCOUNTER — Ambulatory Visit (INDEPENDENT_AMBULATORY_CARE_PROVIDER_SITE_OTHER): Payer: Medicare Other | Admitting: Nurse Practitioner

## 2022-02-14 ENCOUNTER — Encounter (INDEPENDENT_AMBULATORY_CARE_PROVIDER_SITE_OTHER): Payer: Self-pay | Admitting: Nurse Practitioner

## 2022-02-14 VITALS — BP 123/65 | HR 53 | Temp 97.9°F | Ht 61.0 in | Wt 156.0 lb

## 2022-02-14 DIAGNOSIS — E7849 Other hyperlipidemia: Secondary | ICD-10-CM

## 2022-02-14 DIAGNOSIS — E669 Obesity, unspecified: Secondary | ICD-10-CM

## 2022-02-14 DIAGNOSIS — Z6829 Body mass index (BMI) 29.0-29.9, adult: Secondary | ICD-10-CM

## 2022-02-15 NOTE — Progress Notes (Signed)
Chief Complaint:   OBESITY Lisa Acosta is here to discuss her progress with her obesity treatment plan along with follow-up of her obesity related diagnoses. Lisa Acosta is on the Category 2 Plan 6 ounces of meat at supper and states she is following her eating plan approximately 70% of the time. Lisa Acosta states she is doing 0 minutes 0 times per week. Lisa Acosta has knee pain.   Today's visit was #: 13 Starting weight: 198 lbs Starting date: 01/02/2021 Today's weight: 156 lbs Today's date: 02/14/2022 Total lbs lost to date: 42 l;bs Total lbs lost since last in-office visit: 0  Interim History: Lisa Acosta had right knee surgery the end of March. She wasn't able to walk for 6 weeks. She plans to start swimming. She is walking with the aid of a cane and walks with a walker for longer distance. She walked 2 times last week. She is drinking water and a lot of diet sodas recently. She is here today to get back on track.  Subjective:   1. Other hyperlipidemia Lisa Acosta is currently taking Crestor 20 mg. She denies side effects.   Assessment/Plan:   1. Other hyperlipidemia Cardiovascular risk and specific lipid/LDL goals reviewed.  Lisa Acosta will continue to follow up with her primary care physician. She will continue her medications as directed. We discussed several lifestyle modifications today and Lisa Acosta will continue to work on diet, exercise and weight loss efforts. Orders and follow up as documented in patient record.   Counseling Intensive lifestyle modifications are the first line treatment for this issue. Dietary changes: Increase soluble fiber. Decrease simple carbohydrates. Exercise changes: Moderate to vigorous-intensity aerobic activity 150 minutes per week if tolerated. Lipid-lowering medications: see documented in medical record.  2. Obesity with current BMI of 29.6 Lisa Acosta is currently in the action stage of change. As such, her goal is to continue with weight loss efforts. She has agreed to keeping a food  journal and adhering to recommended goals of 1100 calories and 75 plus grams of protein.   Handouts: Lisa Acosta was provided today. We will get IC at next visit.   Exercise goals: No exercise has been prescribed at this time.  Behavioral modification strategies: increasing lean protein intake, increasing water intake, and no skipping meals.  Lisa Acosta has agreed to follow-up with our clinic in 3 weeks. She was informed of the importance of frequent follow-up visits to maximize her success with intensive lifestyle modifications for her multiple health conditions.   Objective:   Blood pressure 123/65, pulse (!) 53, temperature 97.9 F (36.6 C), height '5\' 1"'$  (1.549 m), weight 156 lb (70.8 kg), SpO2 96 %. Body mass index is 29.48 kg/m.  General: Cooperative, alert, well developed, in no acute distress. HEENT: Conjunctivae and lids unremarkable. Cardiovascular: Regular rhythm.  Lungs: Normal work of breathing. Neurologic: No focal deficits.   Lab Results  Component Value Date   CREATININE 0.57 11/16/2021   BUN 19 11/16/2021   NA 140 11/16/2021   K 4.0 11/16/2021   CL 103 11/16/2021   CO2 32 11/16/2021   Lab Results  Component Value Date   ALT 46 (H) 06/30/2021   AST 36 06/30/2021   ALKPHOS 81 06/30/2021   BILITOT 0.7 06/30/2021   Lab Results  Component Value Date   HGBA1C 5.6 11/16/2021   HGBA1C 5.7 06/30/2021   HGBA1C 6.2 (H) 01/05/2021   HGBA1C 5.9 06/23/2019   HGBA1C 5.8 04/15/2018   Lab Results  Component Value Date   INSULIN 20.4  01/05/2021   Lab Results  Component Value Date   TSH 0.86 11/16/2021   Lab Results  Component Value Date   CHOL 180 11/16/2021   HDL 65.10 11/16/2021   LDLCALC 93 11/16/2021   LDLDIRECT 112.0 01/22/2017   TRIG 108.0 11/16/2021   CHOLHDL 3 11/16/2021   Lab Results  Component Value Date   VD25OH 79.10 11/16/2021   VD25OH 25.0 (L) 01/05/2021   Lab Results  Component Value Date   WBC 6.7 12/29/2021   HGB 13.6 12/29/2021    HCT 41.2 12/29/2021   MCV 89.6 12/29/2021   PLT 251.0 12/29/2021   No results found for: IRON, TIBC, FERRITIN  Attestation Statements:   Reviewed by clinician on day of visit: allergies, medications, problem list, medical history, surgical history, family history, social history, and previous encounter notes.  Time spent on visit including pre-visit chart review and post-visit care and charting was 30 minutes.   I, Lisa Acosta, RMA, am acting as Location manager for Lisa Pacific, FNP.  I have reviewed the above documentation for accuracy and completeness, and I agree with the above. Lisa Pacific, FNP

## 2022-03-01 ENCOUNTER — Ambulatory Visit (INDEPENDENT_AMBULATORY_CARE_PROVIDER_SITE_OTHER): Payer: Medicare Other | Admitting: Nurse Practitioner

## 2022-03-01 ENCOUNTER — Encounter (INDEPENDENT_AMBULATORY_CARE_PROVIDER_SITE_OTHER): Payer: Self-pay | Admitting: Nurse Practitioner

## 2022-03-01 VITALS — BP 118/72 | HR 50 | Temp 98.2°F | Ht 61.0 in | Wt 154.0 lb

## 2022-03-01 DIAGNOSIS — Z6829 Body mass index (BMI) 29.0-29.9, adult: Secondary | ICD-10-CM

## 2022-03-01 DIAGNOSIS — I10 Essential (primary) hypertension: Secondary | ICD-10-CM | POA: Diagnosis not present

## 2022-03-01 DIAGNOSIS — R0602 Shortness of breath: Secondary | ICD-10-CM

## 2022-03-01 DIAGNOSIS — E669 Obesity, unspecified: Secondary | ICD-10-CM

## 2022-03-02 NOTE — Progress Notes (Signed)
Chief Complaint:   OBESITY Lisa Acosta is here to discuss her progress with her obesity treatment plan along with follow-up of her obesity related diagnoses. Lisa Acosta is on the Category 1 Plan and states she is following her eating plan approximately 70% of the time. Lisa Acosta states she is walking and doing pool exercises 25-30 minutes 2 times per week.  Today's visit was #: 14 Starting weight: 198 lbs Starting date: 01/02/2021 Today's weight: 154 lbs Today's date: 03/01/2022 Total lbs lost to date: 44 lbs Total lbs lost since last in-office visit: 2  Interim History: Lisa Acosta has done well since last visit. She reports her life is crazy right now. She has been driving and loaning her car out to her grandchildren. She is struggling right now on what to cook for dinner and the cost of food. She has not been exercising due to weather and being busy. She has been swimming twice. She is drinking a protein shake daily, water and diet soda.   Subjective:   1. Essential hypertension Lisa Acosta's blood pressure is a little low today. She has not taken medication yet. Denies any chest pain, shortness of breath or palpitations. Denies dizziness. Recheck BP was 118/72.  2. SOBOE (shortness of breath on exertion) Lisa Acosta's last IC was on 01/05/21 at 1238. Her IC today was 1411.  Assessment/Plan:   1. Essential hypertension  Lisa Acosta will continue to follow up with PCP. Continue medication as directed.  Lisa Acosta is working on healthy weight loss and exercise to improve blood pressure control. We will watch for signs of hypotension as she continues her lifestyle modifications.   2. SOBOE (shortness of breath on exertion) IC reviewed with her today.  3. Obesity with current BMI of 29.1 Lisa Acosta is currently in the action stage of change. As such, her goal is to continue with weight loss efforts. She has agreed to keeping a food journal and adhering to recommended goals of 1100 calories and 75+ grams of protein.    Exercise goals: As is.  Behavioral modification strategies: increasing lean protein intake, increasing water intake, and planning for success.  Lisa Acosta has agreed to follow-up with our clinic in 3 weeks. She was informed of the importance of frequent follow-up visits to maximize her success with intensive lifestyle modifications for her multiple health conditions.   Objective:   Blood pressure 118/72, pulse (!) 50, temperature 98.2 F (36.8 C), height '5\' 1"'$  (1.549 m), weight 154 lb (69.9 kg), SpO2 95 %. Body mass index is 29.1 kg/m.  General: Cooperative, alert, well developed, in no acute distress. HEENT: Conjunctivae and lids unremarkable. Cardiovascular: Regular rhythm.  Lungs: Normal work of breathing. Neurologic: No focal deficits.   Lab Results  Component Value Date   CREATININE 0.57 11/16/2021   BUN 19 11/16/2021   NA 140 11/16/2021   K 4.0 11/16/2021   CL 103 11/16/2021   CO2 32 11/16/2021   Lab Results  Component Value Date   ALT 46 (H) 06/30/2021   AST 36 06/30/2021   ALKPHOS 81 06/30/2021   BILITOT 0.7 06/30/2021   Lab Results  Component Value Date   HGBA1C 5.6 11/16/2021   HGBA1C 5.7 06/30/2021   HGBA1C 6.2 (H) 01/05/2021   HGBA1C 5.9 06/23/2019   HGBA1C 5.8 04/15/2018   Lab Results  Component Value Date   INSULIN 20.4 01/05/2021   Lab Results  Component Value Date   TSH 0.86 11/16/2021   Lab Results  Component Value Date   CHOL 180 11/16/2021  HDL 65.10 11/16/2021   LDLCALC 93 11/16/2021   LDLDIRECT 112.0 01/22/2017   TRIG 108.0 11/16/2021   CHOLHDL 3 11/16/2021   Lab Results  Component Value Date   VD25OH 79.10 11/16/2021   VD25OH 25.0 (L) 01/05/2021   Lab Results  Component Value Date   WBC 6.7 12/29/2021   HGB 13.6 12/29/2021   HCT 41.2 12/29/2021   MCV 89.6 12/29/2021   PLT 251.0 12/29/2021   No results found for: "IRON", "TIBC", "FERRITIN"  Attestation Statements:   Reviewed by clinician on day of visit: allergies,  medications, problem list, medical history, surgical history, family history, social history, and previous encounter notes.  Time spent on visit including pre-visit chart review and post-visit care and charting was 30 minutes.   I, Brendell Tyus, RMA, am acting as transcriptionist for Everardo Pacific, FNP..  I have reviewed the above documentation for accuracy and completeness, and I agree with the above. Everardo Pacific, FNP

## 2022-03-17 ENCOUNTER — Encounter: Payer: Self-pay | Admitting: Family Medicine

## 2022-03-17 DIAGNOSIS — R110 Nausea: Secondary | ICD-10-CM

## 2022-03-17 NOTE — Addendum Note (Signed)
Addended by: Lamar Blinks C on: 03/17/2022 02:37 PM   Modules accepted: Orders

## 2022-03-17 NOTE — Telephone Encounter (Signed)
Pt was last seen on 12/29/21- this was not discussed. Need ov?

## 2022-03-20 ENCOUNTER — Telehealth (HOSPITAL_BASED_OUTPATIENT_CLINIC_OR_DEPARTMENT_OTHER): Payer: Self-pay

## 2022-03-20 NOTE — Addendum Note (Signed)
Addended by: Lamar Blinks C on: 03/20/2022 05:28 AM   Modules accepted: Orders

## 2022-03-21 ENCOUNTER — Encounter: Payer: Self-pay | Admitting: Family Medicine

## 2022-03-21 ENCOUNTER — Other Ambulatory Visit (INDEPENDENT_AMBULATORY_CARE_PROVIDER_SITE_OTHER): Payer: Medicare Other

## 2022-03-21 DIAGNOSIS — R110 Nausea: Secondary | ICD-10-CM

## 2022-03-21 LAB — CBC
HCT: 40.3 % (ref 36.0–46.0)
Hemoglobin: 13.5 g/dL (ref 12.0–15.0)
MCHC: 33.5 g/dL (ref 30.0–36.0)
MCV: 88.8 fl (ref 78.0–100.0)
Platelets: 229 10*3/uL (ref 150.0–400.0)
RBC: 4.54 Mil/uL (ref 3.87–5.11)
RDW: 13 % (ref 11.5–15.5)
WBC: 6.5 10*3/uL (ref 4.0–10.5)

## 2022-03-21 LAB — COMPREHENSIVE METABOLIC PANEL
ALT: 34 U/L (ref 0–35)
AST: 36 U/L (ref 0–37)
Albumin: 4.1 g/dL (ref 3.5–5.2)
Alkaline Phosphatase: 74 U/L (ref 39–117)
BUN: 21 mg/dL (ref 6–23)
CO2: 29 mEq/L (ref 19–32)
Calcium: 9.3 mg/dL (ref 8.4–10.5)
Chloride: 105 mEq/L (ref 96–112)
Creatinine, Ser: 0.6 mg/dL (ref 0.40–1.20)
GFR: 89.99 mL/min (ref >60.00)
Glucose, Bld: 112 mg/dL — ABNORMAL HIGH (ref 70–99)
Potassium: 4 mEq/L (ref 3.5–5.1)
Sodium: 141 mEq/L (ref 135–145)
Total Bilirubin: 0.9 mg/dL (ref 0.2–1.2)
Total Protein: 6.8 g/dL (ref 6.0–8.3)

## 2022-03-21 LAB — LIPASE: Lipase: 19 U/L (ref 11.0–59.0)

## 2022-03-21 NOTE — Progress Notes (Unsigned)
[place] [facility] [address], [place], [place] [address] [phone]  Date:  03/23/2022   Name:  Lisa Acosta   DOB:  Jun 12, 1951   MRN:  [id number]  PCP:  Darreld Mclean, MD    Chief Complaint: No chief complaint on file.   History of Present Illness:  Lisa Acosta is a 71 y.o. very pleasant female patient who presents with the following:  Patient seen today for concern of abdominal pain Most recent visit with myself was in April History of depression, atrial fibrillation, osteoarthritis, osteoporosis, hypothyroidism, hyperlipidemia She recently contacted me with concern of intermittent, recurrent nausea: I've been having severe nausea on and off for several months but now it is happening more frequently, almost everyday. I wasn't pleased with the doctor I used to see from [facility] on [facility]. I'm  also having sudden pains on the left side of and slightly under my left breast. I wouldn't think the two issues are connected unless the pain is possibly from constipation. Anyway it has gotten bad enough that I need to find out what's going on. I've had to find a trash can in a store because I felt like I was going to vomit. 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   Moderate episode of recurrent major depressive disorder ([place]) 09/26/2018   Generalized anxiety disorder 09/26/2018   Osteoporosis 01/25/2018   Osteoarthritis of hands, 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   Dyspnea 09/22/2016   OSA (obstructive sleep apnea) 08/31/2016    PAF (paroxysmal atrial fibrillation) ([place]) 03/16/2016   Hypothyroid 03/16/2016   Obesity (BMI 30-39.9) 03/16/2016   Essential hypertension 11/10/2015   Hyperlipidemia 11/10/2015    Past Medical History:  Diagnosis Date   Atrial fibrillation ([place])    Back pain    Constipation    Depression    Diverticulitis    Diverticulitis    Diverticulosis    Esophageal ulcer    Fatty liver    Gait abnormality 10/14/2018   GERD (gastroesophageal reflux disease)    Hyperlipidemia    Hypertension    Hypothyroid    Memory difficulty 08/18/2019   OSA (obstructive sleep apnea) 08/31/2016   Osteopenia    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   Pre-diabetes     Past Surgical History:  Procedure Laterality Date   BREAST BIOPSY Left    needle core biopsy, benign   CATARACT EXTRACTION     HAND SURGERY     KNEE SURGERY     ROTATOR CUFF REPAIR     TOTAL ABDOMINAL HYSTERECTOMY      Social History   Tobacco Use   Smoking status: Never   Smokeless tobacco: Never  Vaping Use   Vaping Use: Never used  Substance Use Topics   Alcohol use: Yes    Comment: rare   Drug use: No    Family History  Problem Relation Age of Onset   Atrial fibrillation Mother    Congestive Heart Failure Mother    Depression Mother    Heart disease Mother    Stroke Mother  Thyroid disease Mother    Anxiety disorder Mother    Obesity Mother    Heart disease Father    Alcohol abuse Father    High blood pressure Father    High Cholesterol Father    Alcoholism Father    Depression Sister    Heart disease Brother    Heart disease Brother    Depression Daughter     Allergies  Allergen Reactions   Dextran Anaphylaxis   Dextrans    Tree Extract    Penicillin G Rash   Penicillins Rash   Sulfa Antibiotics Rash   Sulfacetamide Sodium Rash    Medication list has been reviewed and updated.  Current Outpatient Medications on File Prior to Visit  Medication Sig Dispense Refill   alendronate (FOSAMAX)  70 MG tablet Take 1 tablet (70 mg total) by mouth every 7 (seven) days. Take with a full glass of water on an empty stomach. 12 tablet 3   amLODipine (NORVASC) 5 MG tablet Take 1 tablet (5 mg total) by mouth daily. 90 tablet 3   atenolol (TENORMIN) 50 MG tablet Take 1 tablet (50 mg total) by mouth daily. 90 tablet 3   flecainide (TAMBOCOR) 100 MG tablet TAKE 1 TABLET BY MOUTH 2 TIMES DAILY 180 tablet 3   FLUoxetine (PROZAC) 40 MG capsule Take 1 capsule (40 mg total) by mouth 2 (two) times daily. 180 capsule 3   fluticasone (FLONASE) 50 MCG/ACT nasal spray SPRAY 2 SPRAYS INTO EACH NOSTRIL EVERY DAY 48 mL 1   levothyroxine (SYNTHROID) 125 MCG tablet TAKE 1 TABLET BY MOUTH DAILY BEFORE BREAKFAST. 90 tablet 3   omeprazole (PRILOSEC) 40 MG capsule TAKE 1 CAPSULE BY MOUTH TWICE A DAY 180 capsule 3   rosuvastatin (CRESTOR) 20 MG tablet TAKE 1 TABLET BY MOUTH EVERY DAY 90 tablet 3   traZODone (DESYREL) 50 MG tablet Take 1.5 tablets (75 mg total) by mouth at bedtime. 135 tablet 1   XARELTO 20 MG TABS tablet TAKE 1 TABLET BY MOUTH DAILY WITH SUPPER. 90 tablet 1   No current facility-administered medications on file prior to visit.    Review of Systems:  As per HPI- otherwise negative.   Physical Examination: There were no vitals filed for this visit. There were no vitals filed for this visit. There is no height or weight on file to calculate BMI. Ideal Body Weight:    GEN: no acute distress. HEENT: Atraumatic, Normocephalic.  Ears and Nose: No external deformity. CV: RRR, No M/G/R. No JVD. No thrill. No extra heart sounds. PULM: CTA B, no wheezes, crackles, rhonchi. No retractions. No resp. distress. No accessory muscle use. ABD: S, NT, ND, +BS. No rebound. No HSM. EXTR: No c/c/e PSYCH: Normally interactive. Conversant.    Assessment and Plan: ***  Signed Lamar Blinks, MD

## 2022-03-21 NOTE — Telephone Encounter (Signed)
Pt scheduled  

## 2022-03-22 ENCOUNTER — Ambulatory Visit (INDEPENDENT_AMBULATORY_CARE_PROVIDER_SITE_OTHER): Payer: Medicare Other | Admitting: Neurology

## 2022-03-22 ENCOUNTER — Ambulatory Visit (INDEPENDENT_AMBULATORY_CARE_PROVIDER_SITE_OTHER): Payer: Medicare Other | Admitting: Nurse Practitioner

## 2022-03-22 ENCOUNTER — Encounter: Payer: Self-pay | Admitting: Neurology

## 2022-03-22 VITALS — BP 117/57 | HR 48 | Ht 61.0 in | Wt 160.0 lb

## 2022-03-22 DIAGNOSIS — Z9989 Dependence on other enabling machines and devices: Secondary | ICD-10-CM | POA: Diagnosis not present

## 2022-03-22 DIAGNOSIS — I480 Paroxysmal atrial fibrillation: Secondary | ICD-10-CM

## 2022-03-22 DIAGNOSIS — Z820 Family history of epilepsy and other diseases of the nervous system: Secondary | ICD-10-CM | POA: Diagnosis not present

## 2022-03-22 DIAGNOSIS — E669 Obesity, unspecified: Secondary | ICD-10-CM | POA: Diagnosis not present

## 2022-03-22 DIAGNOSIS — R351 Nocturia: Secondary | ICD-10-CM | POA: Diagnosis not present

## 2022-03-22 DIAGNOSIS — G4733 Obstructive sleep apnea (adult) (pediatric): Secondary | ICD-10-CM | POA: Diagnosis not present

## 2022-03-22 DIAGNOSIS — R634 Abnormal weight loss: Secondary | ICD-10-CM | POA: Diagnosis not present

## 2022-03-22 NOTE — Progress Notes (Signed)
Subjective:    Patient ID: Lisa Acosta is a 71 y.o. female.  HPI    Star Age, MD, PhD [facility] [address], [address] [place], [place] [address]  Dear Janett Billow,   I saw your patient, Lisa Acosta, upon your kind request in my sleep clinic today for initial consultation of her sleep disorder, in particular, evaluation of her prior diagnosis of obstructive sleep apnea.  The patient is unaccompanied today.  As you know, Mr. Slomski is a 71 year old right-handed woman with an underlying medical history of paroxysmal atrial fibrillation, diverticulitis, reflux disease, hypertension, hyperlipidemia, hypothyroidism, osteopenia, prediabetes, back pain and borderline obesity, who was previously diagnosed with obstructive sleep apnea and placed on PAP therapy.  She has not had a re-evaluation in many years, prior testing was about 10 years ago, test results are not available for my review today.  She has an older PAP machine.  A compliance download was reviewed today.  She brought her machine, she has a ResMed AirSense 10 AutoSet machine with a pressure range currently at 5 to 15 cm, 95th percentile of pressure at 10.9 cm, residual AHI 2.1/h, leak on the high side fairly consistently with the 95th percentile at 44.5 L/min.  She reports that she had a leak in the house.  She has not been using her machine consistently, in the past 30 days she used it 19 days.  She has benefited from treatment especially in the beginning as she recalls.  She has been working on weight loss and has lost about 40 pounds altogether, plus minus.  Her brother has sleep apnea and also uses a machine.  Patient lives with her husband in the downstairs apartment with her daughter's family.  She has 1 daughter who has 2 grown sons.  Patient reports a bedtime of 9 PM to as late as 2 AM depending on how easy it is for her to fall asleep, she likes to read in bed.  She does take trazodone,  typically 50 mg on an average night.  Rise time is generally between 7 and 8.  She has nocturia about twice per average night.  She would like to get reevaluated for her sleep apnea because of weight loss and also in order to update to a new machine. I reviewed your office note from 09/30/2021.  Her Epworth sleepiness score is 5 out of 24, fatigue severity score is 17 out of 63.  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   Pre-diabetes     Her Past Surgical History Is Significant For: Past Surgical History:  Procedure Laterality Date   BREAST BIOPSY Left    needle core biopsy, benign   CATARACT EXTRACTION     HAND SURGERY     KNEE SURGERY     ROTATOR CUFF REPAIR     TOTAL ABDOMINAL HYSTERECTOMY      Her Family History Is Significant For: Family History  Problem  Relation Age of Onset   Atrial fibrillation Mother    Congestive Heart Failure Mother    Depression Mother    Heart disease Mother    Stroke Mother    Thyroid disease Mother    Anxiety disorder Mother    Obesity Mother    Heart disease Father    Alcohol abuse Father    High blood pressure Father    High Cholesterol Father    Alcoholism Father    Depression Sister    Heart disease Brother    Heart disease Brother    Depression Daughter     Her Social History Is Significant For: Social History   Socioeconomic History   Marital status: Married    Spouse name: John   Number of children: 1   Years of education: Engineer, agricultural education level: Not on  file  Occupational History   Occupation: Retired    Fish farm manager: OTHER    Comment: Radiographer, therapeutic  Tobacco Use   Smoking status: Never   Smokeless tobacco: Never  Vaping Use   Vaping Use: Never used  Substance and Sexual Activity   Alcohol use: Yes    Comment: rare   Drug use: No   Sexual activity: Not on file  Other Topics Concern   Not on file  Social History Narrative   Patient lives at home spouse.   Caffeine use: 2 sodas weekly   Right handed    Social Determinants of Health   Financial Resource Strain: Low Risk  (10/27/2021)   Overall Financial Resource Strain (CARDIA)    Difficulty of Paying Living Expenses: Not hard at all  Food Insecurity: No Food Insecurity (10/27/2021)   Hunger Vital Sign    Worried About Running Out of Food in the Last Year: Never true    Ran Out of Food in the Last Year: Never true  Transportation Needs: No Transportation Needs (10/27/2021)   PRAPARE - Hydrologist (Medical): No    Lack of Transportation (Non-Medical): No  Physical Activity: Inactive (09/26/2018)   Exercise Vital Sign    Days of Exercise per Week: 0 days    Minutes of Exercise per Session: 0 min  Stress: No Stress Concern Present (10/27/2021)   [place]    Feeling of Stress : Only a little  Social Connections: Socially Integrated (10/27/2021)   Social Connection and Isolation Panel [NHANES]    Frequency of Communication with Friends and Family: Once a week    Frequency of Social Gatherings with Friends and Family: More than three times a week    Attends Religious Services: More than 4 times per year    Active Member of [employer] or Organizations: Yes    Attends Music therapist: More than 4 times per year    Marital Status: Married    Her Allergies Are:  Allergies  Allergen Reactions   Dextran Anaphylaxis   Dextrans    Tree Extract    Penicillin G Rash    Penicillins Rash   Sulfa Antibiotics Rash   Sulfacetamide Sodium Rash  :   Her Current Medications Are:  Outpatient Encounter Medications as of 03/22/2022  Medication Sig   alendronate (FOSAMAX) 70 MG tablet Take 1 tablet (70 mg total) by mouth every 7 (seven) days. Take with a full glass of water on an empty stomach.   amLODipine (NORVASC) 5 MG tablet Take 1 tablet (5 mg  total) by mouth daily.   atenolol (TENORMIN) 50 MG tablet Take 1 tablet (50 mg total) by mouth daily.   flecainide (TAMBOCOR) 100 MG tablet TAKE 1 TABLET BY MOUTH 2 TIMES DAILY   FLUoxetine (PROZAC) 40 MG capsule Take 1 capsule (40 mg total) by mouth 2 (two) times daily.   fluticasone (FLONASE) 50 MCG/ACT nasal spray SPRAY 2 SPRAYS INTO EACH NOSTRIL EVERY DAY   levothyroxine (SYNTHROID) 125 MCG tablet TAKE 1 TABLET BY MOUTH DAILY BEFORE BREAKFAST.   omeprazole (PRILOSEC) 40 MG capsule TAKE 1 CAPSULE BY MOUTH TWICE A DAY   rosuvastatin (CRESTOR) 20 MG tablet TAKE 1 TABLET BY MOUTH EVERY DAY   traZODone (DESYREL) 50 MG tablet Take 1.5 tablets (75 mg total) by mouth at bedtime.   XARELTO 20 MG TABS tablet TAKE 1 TABLET BY MOUTH DAILY WITH SUPPER.   No facility-administered encounter medications on file as of 03/22/2022.  :   Review of Systems:  Out of a complete 14 point review of systems, all are reviewed and negative with the exception of these symptoms as listed below:  Review of Systems  Neurological:        Here for sleep consult. Prior sleep study and is on cpap currently. Would like to discuss getting new CPAP and updated sleep study.     Objective:  Neurological Exam  Physical Exam Physical Examination:   Vitals:   03/22/22 1124  BP: (!) 117/57  Pulse: (!) 48    General Examination: The patient is a very pleasant 71 y.o. female in no acute distress. She appears well-developed and well-nourished and well groomed.   HEENT: Normocephalic, atraumatic, pupils are equal, round and reactive to light,  extraocular tracking is good without limitation to gaze excursion or nystagmus noted. Hearing is grossly intact. Face is symmetric with normal facial animation. Speech is clear with no dysarthria noted. There is no hypophonia. There is no lip, neck/head, jaw or voice tremor. Neck is supple with full range of passive and active motion. There are no carotid bruits on auscultation. Oropharynx exam reveals: mild mouth dryness, adequate dental hygiene and mild airway crowding, due to small airway entry, slightly wider tongue, tonsils of about 1+ bilaterally.  Mallampati class II.  Neck circumference of 1478 inches.  Mild to moderate overbite noted.  Tongue protrudes centrally and palate elevates symmetrically.  Chest: Clear to auscultation without wheezing, rhonchi or crackles noted.  Heart: S1+S2+0, regular and normal without murmurs, rubs or gallops noted.  Mild bradycardia noted.  Abdomen: Soft, non-tender and non-distended.  Extremities: There is no pitting edema in the distal lower extremities bilaterally.   Skin: Warm and dry without trophic changes noted.   Musculoskeletal: exam reveals no obvious joint deformities.  Appears to be restless in her lower extremities, moving her feet and legs repeatedly.  Neurologically:  Mental status: The patient is awake, alert and oriented in all 4 spheres. Her immediate and remote memory, attention, language skills and fund of knowledge are appropriate. There is no evidence of aphasia, agnosia, apraxia or anomia. Speech is clear with normal prosody and enunciation. Thought process is linear. Mood is normal and affect is normal.  Cranial nerves II - XII are as described above under HEENT exam.  Motor exam: Normal bulk, strength and tone is noted. There is no obvious tremor.  Fine motor skills and coordination: grossly intact.  Cerebellar testing: No dysmetria or intention tremor. There is no truncal or gait ataxia.  Sensory exam: intact to light touch  in the  upper and lower extremities.  Gait, station and balance: She stands slowly, she uses a 4 pronged cane.  Assessment and Plan:  In summary, Tayen Jadesola Poynter is a very pleasant 71 y.o.-year old female with an underlying medical history of paroxysmal atrial fibrillation, diverticulitis, reflux disease, hypertension, hyperlipidemia, hypothyroidism, osteopenia, prediabetes, back pain and borderline obesity, who presents for evaluation of her obstructive sleep apnea.  She has an older AutoPap machine.  She has recently restarted using it but has not been fully consistent with it.  She has over time benefited from treatment but has also achieved quite a bit of weight loss in the recent past.  She would benefit from reevaluation.  Her current DME company is [employer].  She has a ResMed AirSense 10 AutoSet machine.  She is agreeable to pursuing retesting. I had a long chat with the patient about my findings and the diagnosis of sleep apnea, particularly OSA, its prognosis and treatment options. We talked about medical/conservative treatments, surgical interventions and non-pharmacological approaches for symptom control. I explained, in particular, the risks and ramifications of untreated moderate to severe OSA, especially with respect to developing cardiovascular disease down the road, including congestive heart failure (CHF), difficult to treat hypertension, cardiac arrhythmias (particularly A-fib), neurovascular complications including TIA, stroke and dementia. Even type 2 diabetes has, in part, been linked to untreated OSA. Symptoms of untreated OSA may include (but may not be limited to) daytime sleepiness, nocturia (i.e. frequent nighttime urination), memory problems, mood irritability and suboptimally controlled or worsening mood disorder such as depression and/or anxiety, lack of energy, lack of motivation, physical discomfort, as well as recurrent headaches, especially morning or nocturnal headaches. We  talked about the importance of maintaining a healthy lifestyle and striving for healthy weight. In addition, we talked about the importance of striving for and maintaining good sleep hygiene. I recommended the following at this time: sleep study.  I outlined the differences between a laboratory attended sleep study which is considered more comprehensive and accurate over the option of a home sleep test (HST); the latter may lead to underestimation of sleep disordered breathing in some instances and does not help with diagnosing upper airway resistance syndrome and is not accurate enough to diagnose primary central sleep apnea typically. The patient indicated that she would be willing to continue to use positive airway pressure treatment.  We talked a little bit about alternative treatment options including inspire, she had seen an app for it on TV.  She is not in favor of pursuing any alternative options especially any surgical options at this time and I agree.  We will pick up our discussion after testing, we will keep her posted as to her test results by phone call in the interim and plan a follow-up accordingly.  She will need a follow-up according to her set up date with new equipment according to insurance requirement.  I answered all her questions today and she was in agreement with our plan.   Thank you very much for allowing me to participate in the care of this nice patient. If I can be of any further assistance to you please do not hesitate to call me at [phone].  Sincerely,   Star Age, MD, PhD

## 2022-03-22 NOTE — Patient Instructions (Signed)
Thank you for choosing [facility] for your sleep related care! It was nice to meet you today!   Here is what we discussed today and what we came up with as our plan for you:    Based on your symptoms and your exam I believe you are still at risk for obstructive sleep apnea and would benefit from re-evaluation as it has been many years and you need new supplies and an updated machine. Therefore, I think we should proceed with a sleep study to determine how severe your sleep apnea is. If you have more than mild OSA, I want you to consider ongoing treatment with CPAP. Please remember, the risks and ramifications of moderate to severe obstructive sleep apnea or OSA are: Cardiovascular disease, including congestive heart failure, stroke, difficult to control hypertension, arrhythmias, and even type 2 diabetes has been linked to untreated OSA. Sleep apnea causes disruption of sleep and sleep deprivation in most cases, which, in turn, can cause recurrent headaches, problems with memory, mood, concentration, focus, and vigilance. Most people with untreated sleep apnea report excessive daytime sleepiness, which can affect their ability to drive. Please do not drive if you feel sleepy.   I will likely see you back after your sleep study to go over the test results and where to go from there. We will call you after your sleep study to advise about the results (most likely, you will hear from [place], my nurse) and to set up an appointment at the time, as necessary.    Our sleep lab administrative assistant will call you to schedule your sleep study. If you don't hear back from her by about 2 weeks from now, please feel free to call her at [phone]. You can leave a message with your phone number and concerns, if you get the voicemail box. She will call back as soon as possible.

## 2022-03-23 ENCOUNTER — Telehealth: Payer: Self-pay | Admitting: Neurology

## 2022-03-23 ENCOUNTER — Ambulatory Visit (HOSPITAL_BASED_OUTPATIENT_CLINIC_OR_DEPARTMENT_OTHER)
Admission: RE | Admit: 2022-03-23 | Discharge: 2022-03-23 | Disposition: A | Discharge status: Home / Self Care | Payer: Medicare Other | Source: Ambulatory Visit | Attending: Family Medicine | Admitting: Family Medicine

## 2022-03-23 ENCOUNTER — Ambulatory Visit (INDEPENDENT_AMBULATORY_CARE_PROVIDER_SITE_OTHER): Payer: Medicare Other | Admitting: Family Medicine

## 2022-03-23 VITALS — BP 124/72 | HR 76 | Temp 97.8°F | Resp 18 | Ht 61.0 in | Wt 156.0 lb

## 2022-03-23 DIAGNOSIS — R109 Unspecified abdominal pain: Secondary | ICD-10-CM | POA: Diagnosis not present

## 2022-03-23 DIAGNOSIS — R1013 Epigastric pain: Secondary | ICD-10-CM | POA: Diagnosis not present

## 2022-03-23 DIAGNOSIS — R110 Nausea: Secondary | ICD-10-CM | POA: Insufficient documentation

## 2022-03-23 DIAGNOSIS — K7689 Other specified diseases of liver: Secondary | ICD-10-CM | POA: Diagnosis not present

## 2022-03-23 DIAGNOSIS — R0781 Pleurodynia: Secondary | ICD-10-CM | POA: Diagnosis not present

## 2022-03-23 MED ORDER — SUCRALFATE 1 G PO TABS: 1.0000 g | ORAL_TABLET | Freq: Three times a day (TID) | ORAL | 0 refills | Status: DC

## 2022-03-23 NOTE — Telephone Encounter (Signed)
LVM for pt to call back to schedule   Medicare/mutual of [place] no auth req

## 2022-03-23 NOTE — Patient Instructions (Addendum)
It was good to see you today- I will call [facility] and see if they can get you seen soon  For the time being take the omeprazole 40 twice daily- and add carafate 4x daily for 10 days  Please let me know if anything is getting worse or changing   7/14 2;30

## 2022-03-24 DIAGNOSIS — R1013 Epigastric pain: Secondary | ICD-10-CM | POA: Diagnosis not present

## 2022-03-24 DIAGNOSIS — R112 Nausea with vomiting, unspecified: Secondary | ICD-10-CM | POA: Diagnosis not present

## 2022-03-24 DIAGNOSIS — R1310 Dysphagia, unspecified: Secondary | ICD-10-CM | POA: Diagnosis not present

## 2022-03-27 ENCOUNTER — Ambulatory Visit: Payer: Medicare Other | Admitting: Family Medicine

## 2022-03-28 ENCOUNTER — Telehealth: Payer: Self-pay

## 2022-03-28 ENCOUNTER — Encounter: Payer: Self-pay | Admitting: Family Medicine

## 2022-03-28 NOTE — Telephone Encounter (Signed)
   Pre-operative Risk Assessment    Patient Name: Lisa Acosta  DOB: Mar 19, 1951 MRN: [id number]      Request for Surgical Clearance    Procedure:   Endoscopy  Date of Surgery:  Clearance 06/19/22                                 Surgeon:  Dr. Therisa Doyne Surgeon's Group or Practice Name:  [facility] Phone number:  [phone] Fax number:  [phone]   Type of Clearance Requested:   - Pharmacy:  Hold Rivaroxaban (Xarelto) 3   Type of Anesthesia:   Propofol   Additional requests/questions:    Signed, Wonda Horner   03/28/2022, 2:33 PM

## 2022-03-30 ENCOUNTER — Ambulatory Visit (INDEPENDENT_AMBULATORY_CARE_PROVIDER_SITE_OTHER): Payer: Medicare Other | Admitting: Neurology

## 2022-03-30 DIAGNOSIS — G4720 Circadian rhythm sleep disorder, unspecified type: Secondary | ICD-10-CM

## 2022-03-30 DIAGNOSIS — R0683 Snoring: Secondary | ICD-10-CM

## 2022-03-30 DIAGNOSIS — Z820 Family history of epilepsy and other diseases of the nervous system: Secondary | ICD-10-CM

## 2022-03-30 DIAGNOSIS — G4733 Obstructive sleep apnea (adult) (pediatric): Secondary | ICD-10-CM

## 2022-03-30 DIAGNOSIS — R634 Abnormal weight loss: Secondary | ICD-10-CM

## 2022-03-30 DIAGNOSIS — E66811 Obesity, class 1: Secondary | ICD-10-CM

## 2022-03-30 DIAGNOSIS — R9431 Abnormal electrocardiogram [ECG] [EKG]: Secondary | ICD-10-CM

## 2022-03-30 DIAGNOSIS — E669 Obesity, unspecified: Secondary | ICD-10-CM

## 2022-03-30 DIAGNOSIS — I480 Paroxysmal atrial fibrillation: Secondary | ICD-10-CM

## 2022-03-30 DIAGNOSIS — R351 Nocturia: Secondary | ICD-10-CM

## 2022-03-30 DIAGNOSIS — G4734 Idiopathic sleep related nonobstructive alveolar hypoventilation: Secondary | ICD-10-CM

## 2022-03-30 NOTE — Telephone Encounter (Signed)
It appears patient is now being followed by [facility]. She was last seen in our office 01/2021 and she has multiple notes from [facility] since that time.  Emmaline Life, NP-C    03/30/2022, 9:47 AM [place] [address] Office [phone] Fax [phone]

## 2022-03-31 ENCOUNTER — Encounter: Payer: Self-pay | Admitting: Family Medicine

## 2022-04-04 ENCOUNTER — Ambulatory Visit (INDEPENDENT_AMBULATORY_CARE_PROVIDER_SITE_OTHER): Payer: Medicare Other | Admitting: Nurse Practitioner

## 2022-04-04 ENCOUNTER — Encounter (INDEPENDENT_AMBULATORY_CARE_PROVIDER_SITE_OTHER): Payer: Self-pay | Admitting: Nurse Practitioner

## 2022-04-04 VITALS — BP 100/64 | HR 47 | Temp 98.9°F | Ht 61.0 in | Wt 156.0 lb

## 2022-04-04 DIAGNOSIS — E038 Other specified hypothyroidism: Secondary | ICD-10-CM

## 2022-04-04 DIAGNOSIS — Z6829 Body mass index (BMI) 29.0-29.9, adult: Secondary | ICD-10-CM

## 2022-04-04 DIAGNOSIS — E669 Obesity, unspecified: Secondary | ICD-10-CM

## 2022-04-04 NOTE — Procedures (Signed)
PATIENT'S NAME:  Lisa Acosta, Lisa Acosta DOB:      07-Dec-1950      MR#:    [id number]     DATE OF RECORDING: 03/30/2022 REFERRING M.D.:  Lamar Blinks, MD Study Performed:   Baseline Polysomnogram HISTORY: 71 year old woman with a history of paroxysmal atrial fibrillation, diverticulitis, reflux disease, hypertension, hyperlipidemia, hypothyroidism, osteopenia, prediabetes, back pain and borderline obesity, who was previously diagnosed with obstructive sleep apnea and placed on PAP therapy. She has not had a re-evaluation in many years. She has been working on weight loss and has lost about 40 pounds altogether. The patient endorsed the Epworth Sleepiness Scale at 5 points. The patient's weight 160 pounds with a height of 61 (inches), resulting in a BMI of 30.4 kg/m2. The patient's neck circumference measured 14 inches.  CURRENT MEDICATIONS: Fosamax, Norvasc, Tenormin, Tambocor, Prozac, Flonase, Synthroid, Prilosec, Crestor, Desyrel, Xarelto   PROCEDURE:  This is a multichannel digital polysomnogram utilizing the Somnostar 11.2 system.  Electrodes and sensors were applied and monitored per AASM Specifications.   EEG, EOG, Chin and Limb EMG, were sampled at 200 Hz.  ECG, Snore and Nasal Pressure, Thermal Airflow, Respiratory Effort, CPAP Flow and Pressure, Oximetry was sampled at 50 Hz. Digital video and audio were recorded.      BASELINE STUDY  The patient took trazodone 50 mg before lights out. Lights Out was at 22:00 and Lights On at 05:05.  Total recording time (TRT) was 425.5 minutes, with a total sleep time (TST) of 347.5 minutes.   The patient's sleep latency was 19 minutes. REM sleep was absent.  The sleep efficiency was 81.7 %.     SLEEP ARCHITECTURE: WASO (Wake after sleep onset) was 72.5 minutes.  There were 24 minutes in Stage N1, 284.5 minutes Stage N2, 39 minutes Stage N3 and 0 minutes in Stage REM.  The percentage of Stage N1 was 6.9%, Stage N2 was 81.9%, which is markedly increased, Stage N3  was 11.2% and Stage R (REM sleep) was absent. The arousals were noted as: 62 were spontaneous, 10 were associated with PLMs, 1 were associated with respiratory events.  RESPIRATORY ANALYSIS:  There were a total of 2 respiratory events:  1 obstructive apneas, 0 central apneas and 0 mixed apneas with a total of 1 apneas and an apnea index (AI) of .2 /hour. There were 1 hypopneas with a hypopnea index of .2 /hour. The patient also had 0 respiratory event related arousals (RERAs).      The total APNEA/HYPOPNEA INDEX (AHI) was .3/hour and the total RESPIRATORY DISTURBANCE INDEX was  .3 /hour.  0 events occurred in REM sleep and 2 events in NREM. The REM AHI was  0 /hour, versus a non-REM AHI of .3. The patient spent 347.5 minutes of total sleep time in the supine position and 0 minutes in non-supine.. The supine AHI was 0.4 versus a non-supine AHI of 0.0.  OXYGEN SATURATION & C02:  The Wake baseline 02 saturation was 87%, and the asleep baseline oxygen saturation was 84%. The patient was started on supplemental oxygen at 1 lpm at 22:42, epoch 91, after which the average asleep oxygen saturation improved to 91%. The oxygen was kept at 1 lpm and for comparison values, her O2 was discontinued at 04:09, epoch 745, after which her average awake oxygen saturation drifted down to 87% and her average asleep oxygen saturations drifted down to 85%.    PERIODIC LIMB MOVEMENTS: The patient had a total of 63 Periodic Limb Movements.  The Periodic  Limb Movement (PLM) index was 10.9 and the PLM Arousal index was 1.7/hour.  Audio and video analysis did not show any abnormal or unusual movements, behaviors, phonations or vocalizations. The patient took 2 bathroom breaks. Mild snoring was noted. The EKG showed mild irregularity, but was not in keeping with atrial fibrillation.    Post-study, the patient indicated that sleep was worse than usual.   IMPRESSION:  Nocturnal hypoxemia Primary Snoring Dysfunctions associated  with sleep stages or arousal from sleep Nonspecific abnormal EKG  RECOMMENDATIONS:  This study does not demonstrate any significant obstructive or central sleep disordered breathing. The use of CPAP or autoPAP is not indicated based on this study. The absence of REM sleep during this study may have led to some degree of underestimation f her sleep apnea. Her weight loss has likely aided in reducing her sleep disordered breathing as she has lost a significant amount of weight, compared to when she was first diagnosed with OSA.  The patient has evidence of hypoxemia, during wakefulness and sleep. She will be advised to seek evaluation for an underlying pulmonary cause; she will be encouraged to follow-up with her PCP to discuss a referral to pulmonology.  This study shows sleep fragmentation and abnormal sleep stage percentages; these are nonspecific findings and per se do not signify an intrinsic sleep disorder or a cause for the patient's sleep-related symptoms. Causes include (but are not limited to) the first night effect of the sleep study, circadian rhythm disturbances, medication effect or an underlying mood disorder or medical problem.  The patient should be cautioned not to drive, work at heights, or operate dangerous or heavy equipment when tired or sleepy. Review and reiteration of good sleep hygiene measures should be pursued with any patient. The study showed an abnormal single lead EKG, but no clear evidence of atrial fibrillation; clinical correlation is recommended. The patient is followed by cardiology. The patient will be advised to follow up with the referring provider, who will be notified of the test results.   I certify that I have reviewed the entire raw data recording prior to the issuance of this report in accordance with the Standards of Accreditation of the American Academy of Sleep Medicine (AASM)    Star Age, MD, PhD Diplomat, American Board of Neurology and Sleep  Medicine (Neurology and Sleep Medicine)

## 2022-04-05 ENCOUNTER — Telehealth: Payer: Self-pay | Admitting: *Deleted

## 2022-04-05 DIAGNOSIS — F321 Major depressive disorder, single episode, moderate: Secondary | ICD-10-CM | POA: Diagnosis not present

## 2022-04-05 NOTE — Telephone Encounter (Signed)
Spoke to patient gave sleep study results . Pt was very surprised about results . Pt states she has been using CPAP for 20+ years she will continuing using it until CPAP machine doesn't work anymore. Informed patient that was fine. Informed patient Per Dr. Rexene Alberts please contact PCP to get a referral  to Pulmonology  due to lower oxygen rate throughout the night during sleep study . Pt states she will call PCP tomorrow  for referral . Pt expressed understanding and thanked me for calling . Did forward sleep study results to referring MDJanett Billow Acosta)

## 2022-04-05 NOTE — Telephone Encounter (Signed)
-----   Message from Star Age, MD sent at 04/04/2022  6:20 PM EDT ----- Patient referred by Dr. Lorelei Pont for OSA eval. She has been on autoPAP, seen by me on 03/22/22, diagnostic PSG on 03/30/22.   Please call and notify the patient that the recent sleep study did not show any significant obstructive sleep apnea, her AHI was 0.3/hour. However, she had lower oxygen saturations throughout the night. While treatment with CPAP or autoPAP is not indicated based on her study (her weight loss has likely aided in reducing her sleep apnea), I recommend she follow-up with her PCP to discuss a referral to pulmonology (lung specialist) to look for an underlying cause of her lower oxygen saturations. At this juncture, she will not qualify for a new autoPAP, but should FU with PCP.  Thanks,  Star Age, MD, PhD [facility])

## 2022-04-06 ENCOUNTER — Other Ambulatory Visit: Payer: Self-pay

## 2022-04-06 ENCOUNTER — Emergency Department (HOSPITAL_BASED_OUTPATIENT_CLINIC_OR_DEPARTMENT_OTHER): Payer: Medicare Other

## 2022-04-06 ENCOUNTER — Ambulatory Visit (INDEPENDENT_AMBULATORY_CARE_PROVIDER_SITE_OTHER): Payer: Medicare Other | Admitting: Family Medicine

## 2022-04-06 ENCOUNTER — Emergency Department (HOSPITAL_BASED_OUTPATIENT_CLINIC_OR_DEPARTMENT_OTHER)
Admission: EM | Admit: 2022-04-06 | Discharge: 2022-04-06 | Disposition: A | Discharge status: Home / Self Care | Payer: Medicare Other | Attending: Emergency Medicine | Admitting: Emergency Medicine

## 2022-04-06 VITALS — BP 138/80 | HR 69 | Temp 97.9°F | Resp 18 | Ht 61.0 in | Wt 160.2 lb

## 2022-04-06 DIAGNOSIS — R0789 Other chest pain: Secondary | ICD-10-CM | POA: Diagnosis not present

## 2022-04-06 DIAGNOSIS — J181 Lobar pneumonia, unspecified organism: Secondary | ICD-10-CM | POA: Diagnosis not present

## 2022-04-06 DIAGNOSIS — Z7901 Long term (current) use of anticoagulants: Secondary | ICD-10-CM | POA: Diagnosis not present

## 2022-04-06 DIAGNOSIS — E039 Hypothyroidism, unspecified: Secondary | ICD-10-CM | POA: Diagnosis not present

## 2022-04-06 DIAGNOSIS — I517 Cardiomegaly: Secondary | ICD-10-CM | POA: Diagnosis not present

## 2022-04-06 DIAGNOSIS — I10 Essential (primary) hypertension: Secondary | ICD-10-CM | POA: Diagnosis not present

## 2022-04-06 DIAGNOSIS — J189 Pneumonia, unspecified organism: Secondary | ICD-10-CM | POA: Diagnosis not present

## 2022-04-06 DIAGNOSIS — R1011 Right upper quadrant pain: Secondary | ICD-10-CM | POA: Insufficient documentation

## 2022-04-06 DIAGNOSIS — R001 Bradycardia, unspecified: Secondary | ICD-10-CM | POA: Diagnosis not present

## 2022-04-06 DIAGNOSIS — R079 Chest pain, unspecified: Secondary | ICD-10-CM

## 2022-04-06 DIAGNOSIS — R110 Nausea: Secondary | ICD-10-CM | POA: Insufficient documentation

## 2022-04-06 DIAGNOSIS — Z79899 Other long term (current) drug therapy: Secondary | ICD-10-CM | POA: Diagnosis not present

## 2022-04-06 DIAGNOSIS — I4891 Unspecified atrial fibrillation: Secondary | ICD-10-CM | POA: Diagnosis not present

## 2022-04-06 DIAGNOSIS — R0902 Hypoxemia: Secondary | ICD-10-CM | POA: Diagnosis not present

## 2022-04-06 DIAGNOSIS — R7989 Other specified abnormal findings of blood chemistry: Secondary | ICD-10-CM | POA: Diagnosis not present

## 2022-04-06 DIAGNOSIS — I700 Atherosclerosis of aorta: Secondary | ICD-10-CM | POA: Diagnosis not present

## 2022-04-06 LAB — BASIC METABOLIC PANEL
Anion gap: 7 (ref 5–15)
BUN: 26 mg/dL — ABNORMAL HIGH (ref 8–23)
CO2: 24 mmol/L (ref 22–32)
Calcium: 9.2 mg/dL (ref 8.9–10.3)
Chloride: 107 mmol/L (ref 98–111)
Creatinine, Ser: 0.62 mg/dL (ref 0.44–1.00)
GFR, Estimated: >60 mL/min (ref >60)
Glucose, Bld: 95 mg/dL (ref 70–99)
Potassium: 3.9 mmol/L (ref 3.5–5.1)
Sodium: 138 mmol/L (ref 135–145)

## 2022-04-06 LAB — TROPONIN I (HIGH SENSITIVITY): Troponin I (High Sensitivity): 7 ng/L (ref <18)

## 2022-04-06 LAB — CBC
HCT: 40.2 % (ref 36.0–46.0)
Hemoglobin: 13.6 g/dL (ref 12.0–15.0)
MCH: 29.8 pg (ref 26.0–34.0)
MCHC: 33.8 g/dL (ref 30.0–36.0)
MCV: 88 fL (ref 80.0–100.0)
Platelets: 231 10*3/uL (ref 150–400)
RBC: 4.57 MIL/uL (ref 3.87–5.11)
RDW: 12.4 % (ref 11.5–15.5)
WBC: 7.8 10*3/uL (ref 4.0–10.5)
nRBC: 0 % (ref 0.0–0.2)

## 2022-04-06 MED ORDER — IOHEXOL 350 MG/ML SOLN
75.0000 mL | Freq: Once | INTRAVENOUS | Status: AC | PRN
  Administered 2022-04-06: 75 mL via INTRAVENOUS

## 2022-04-06 MED ORDER — LEVOFLOXACIN 750 MG PO TABS: 750.0000 mg | ORAL_TABLET | Freq: Every day | ORAL | 0 refills | Status: AC

## 2022-04-06 NOTE — ED Provider Notes (Signed)
[facility] EMERGENCY DEPARTMENT Provider Note   CSN: [id number] Arrival date & time: 04/06/22  1631     History  Chief Complaint  Patient presents with   Chest Pain    Lisa Acosta is a 71 y.o. female.  Lisa Acosta is a 71 year old female with a past medical history of hypertension, atrial fibrillation, hyperlipidemia, obstructive sleep apnea, GERD, hiatal hernia, and hypothyroidism who presents from her primary care office with complaints of about a week of left-sided chest pain, cough, and difficulty taking a full breath.  She has also had epigastric/right upper quadrant abdominal pain with nausea for the past few weeks and has been evaluated by gastroenterology in this time with a normal right upper quadrant ultrasound done.  She has a strong family history of coronary artery disease and she had a negative stress test done 2 months ago.  She denies any association of her pain with exertion.  She notes the pain can happen when she is laying down or when she is walking around and does not know any alleviating or aggravating factors.  She denies any radiation of her left chest pain to her arm or neck/jaw or any associated presyncope or shortness of breath.  She is on Xarelto for her A-fib and has not missed any doses of this or had any long car rides recently.   Chest Pain Associated symptoms: abdominal pain, cough and nausea   Associated symptoms: no fever, no shortness of breath, no vomiting and no weakness        Home Medications Prior to Admission medications   Medication Sig Start Date End Date Taking? Authorizing Provider  levofloxacin (LEVAQUIN) 750 MG tablet Take 1 tablet (750 mg total) by mouth daily for 5 days. 04/06/22 04/11/22 Yes Johny Blamer, DO  alendronate (FOSAMAX) 70 MG tablet Take 1 tablet (70 mg total) by mouth every 7 (seven) days. Take with a full glass of water on an empty stomach. 01/24/21   Copland, Gay Filler, MD  amLODipine (NORVASC) 5 MG  tablet Take 1 tablet (5 mg total) by mouth daily. 12/29/21   Copland, Gay Filler, MD  atenolol (TENORMIN) 50 MG tablet Take 1 tablet (50 mg total) by mouth daily. 12/29/21   Copland, Gay Filler, MD  flecainide (TAMBOCOR) 100 MG tablet TAKE 1 TABLET BY MOUTH 2 TIMES DAILY 12/09/21   Copland, Gay Filler, MD  FLUoxetine (PROZAC) 40 MG capsule Take 1 capsule (40 mg total) by mouth 2 (two) times daily. 12/29/21   Copland, Gay Filler, MD  fluticasone (FLONASE) 50 MCG/ACT nasal spray SPRAY 2 SPRAYS INTO EACH NOSTRIL EVERY DAY 11/28/21   Saguier, Percell Miller, PA-C  levothyroxine (SYNTHROID) 125 MCG tablet TAKE 1 TABLET BY MOUTH DAILY BEFORE BREAKFAST. 02/10/22   Copland, Gay Filler, MD  omeprazole (PRILOSEC) 40 MG capsule TAKE 1 CAPSULE BY MOUTH TWICE A DAY 07/21/21   Copland, Gay Filler, MD  rosuvastatin (CRESTOR) 20 MG tablet TAKE 1 TABLET BY MOUTH EVERY DAY 12/19/21   Copland, Gay Filler, MD  sucralfate (CARAFATE) 1 g tablet Take 1 tablet (1 g total) by mouth 4 (four) times daily -  with meals and at bedtime. 03/23/22   Copland, Gay Filler, MD  traZODone (DESYREL) 50 MG tablet Take 1.5 tablets (75 mg total) by mouth at bedtime. 09/22/21   Copland, Gay Filler, MD  XARELTO 20 MG TABS tablet TAKE 1 TABLET BY MOUTH DAILY WITH SUPPER. 10/21/21   Copland, Gay Filler, MD      Allergies  Dextran, Dextrans, Tree extract, Penicillin g, Penicillins, Sulfa antibiotics, and Sulfacetamide sodium    Review of Systems   Review of Systems  Constitutional:  Negative for chills and fever.  Respiratory:  Positive for cough and chest tightness. Negative for shortness of breath.   Cardiovascular:  Positive for chest pain. Negative for leg swelling.  Gastrointestinal:  Positive for abdominal pain and nausea. Negative for vomiting.  Neurological:  Negative for syncope, facial asymmetry, weakness and light-headedness.  Psychiatric/Behavioral:  Negative for decreased concentration.     Physical Exam Updated Vital Signs BP (!) 147/74   Pulse  (!) 56   Temp 99.1 F (37.3 C) (Oral)   Resp (!) 22   SpO2 93%  Physical Exam Vitals reviewed.  Constitutional:      General: She is not in acute distress. Cardiovascular:     Rate and Rhythm: Regular rhythm. Bradycardia present.     Pulses:          Radial pulses are 2+ on the right side and 2+ on the left side.       Dorsalis pedis pulses are 2+ on the right side and 2+ on the left side.  Pulmonary:     Effort: Pulmonary effort is normal. No tachypnea, accessory muscle usage or respiratory distress.     Breath sounds: Normal breath sounds.  Chest:     Chest wall: Tenderness (Tenderness to palpation midline over the sternum, otherwise no chest wall tenderness) present.  Abdominal:     Palpations: Abdomen is soft.     Tenderness: There is abdominal tenderness (Tenderness to palpation of the epigastric and supraumbilical region otherwise no abdominal tenderness.). There is no guarding or rebound.  Musculoskeletal:     Right lower leg: No tenderness. No edema.     Left lower leg: No tenderness. No edema.  Skin:    General: Skin is warm and dry.  Neurological:     Mental Status: She is alert.     ED Results / Procedures / Treatments   Labs (all labs ordered are listed, but only abnormal results are displayed) Labs Reviewed  BASIC METABOLIC PANEL - Abnormal; Notable for the following components:      Result Value   BUN 26 (*)    All other components within normal limits  CBC  TROPONIN I (HIGH SENSITIVITY)    EKG None  Radiology CT Angio Chest PE W/Cm &/Or Wo Cm  Result Date: 04/06/2022 CLINICAL DATA:  Pulmonary embolism (PE) suspected, unknown D-dimer. 71 y/o female. c/o mid chest pain, ongoing for some time. Recently prescribed carafate and zofran for potential esophageal ulcer. EXAM: CT ANGIOGRAPHY CHEST WITH CONTRAST TECHNIQUE: Multidetector CT imaging of the chest was performed using the standard protocol during bolus administration of intravenous contrast.  Multiplanar CT image reconstructions and MIPs were obtained to evaluate the vascular anatomy. RADIATION DOSE REDUCTION: This exam was performed according to the departmental dose-optimization program which includes automated exposure control, adjustment of the mA and/or kV according to patient size and/or use of iterative reconstruction technique. CONTRAST:  3m OMNIPAQUE IOHEXOL 350 MG/ML SOLN COMPARISON:  Chest x-ray 04/06/2022, ultrasound abdomen 03/23/2022, CT abdomen pelvis 09/24/2019, chest x-ray 11/03/2020 FINDINGS: Cardiovascular: Satisfactory opacification of the pulmonary arteries to the segmental level. No evidence of pulmonary embolism. Prominent heart size. No significant pericardial effusion. The thoracic aorta is normal in caliber. At least mild atherosclerotic plaque of the thoracic aorta. No coronary artery calcifications. Mediastinum/Nodes: A 1.1 cm left hilar lymph node. No enlarged mediastinal,  right hilar, or axillary lymph nodes. Thyroid gland, trachea, and esophagus demonstrate no significant findings. Lungs/Pleura: Peripheral consolidation within the left upper lobe. No pulmonary nodule. No pulmonary mass. No pleural effusion. No pneumothorax. Upper Abdomen: No acute abnormality. There is a 2 x 2.2 cm fluid density lesion within the right hepatic lobe that likely represents a simple hepatic cyst. Subcentimeter hypodensity too small to characterize. Colonic diverticulosis. Musculoskeletal: No chest wall abnormality. No suspicious lytic or blastic osseous lesions. No acute displaced fracture. Multilevel degenerative changes of the spine. Superior endplate T12 mild concavity with associated Schmorl node. Review of the MIP images confirms the above findings. IMPRESSION: 1. No pulmonary embolus. 2. Left upper lobe consolidation. Followup PA and lateral chest X-ray is recommended in 3-4 weeks following therapy to ensure resolution and exclude underlying malignancy. 3. Nonspecific 1.1 cm left hilar  lymph node. Finding may be reactive in etiology. 4. Mild cardiomegaly. Electronically Signed   By: Iven Finn M.D.   On: 04/06/2022 18:20   DG Chest 2 View  Result Date: 04/06/2022 CLINICAL DATA:  Chest pain EXAM: CHEST - 2 VIEW COMPARISON:  11/03/2020, 12/15/2020 FINDINGS: Interval left upper lobe opacity. No pleural effusion. Normal cardiac size with aortic atherosclerosis. No pneumothorax IMPRESSION: Interval left upper lobe opacity which could be secondary to pneumonia or potentially a mass. Recommend chest CT for further evaluation. Electronically Signed   By: Donavan Foil M.D.   On: 04/06/2022 17:15    Procedures Procedures    Medications Ordered in ED Medications  iohexol (OMNIPAQUE) 350 MG/ML injection 75 mL (75 mLs Intravenous Contrast Given 04/06/22 1750)    ED Course/ Medical Decision Making/ A&P                           Medical Decision Making Lisa Acosta is a 71 year old female with a past medical history of hypertension, atrial fibrillation, hyperlipidemia, obstructive sleep apnea, GERD, hiatal hernia, and hypothyroidism who presents with 1 week of intermittent left-sided chest pain associated cough and difficulty taking a full breath.  She was sent from her primary care physician's office due to concern that this could be cardiac or pulmonary in nature as she has been seen by GI for her ongoing epigastric and right upper quadrant pain recently.  On initial evaluation she appears stable with heart rate in the 50s and O2 sats in the low 90s intermittently.  Initial chest x-ray showed a opacity in the left upper lobe that was questionable for lobar pneumonia versus mass that could be further characterized by CT.  Bradycardia can be explained by her beta-blocker therapy but there is concern that this may be masking response to PE with her low O2 sats, sternal pleuritic chest pain, and recent cough.  Overall suspicion of PE is low but in conjunction with her chest x-ray  findings we elected to pursue a CT angiogram of the chest with PE timing.  Other work-up so far shown no signs of systemic infection or any abnormalities on her basic metabolic panel.  First troponin was negative.  CT scan showed consolidation in the left upper lobe but no evidence of PE.  With community-acquired pneumonia is the most likely diagnosis at this time we will prescribe Levaquin 750 mg daily for 5 days and discharge her.  We discussed the second troponin and respiratory panel that had not been drawn yet and together with the patient we decided that this left upper lobe pneumonia is the most  likely cause of her left-sided chest pain and cough and that she would like to go home and not wait for these to be drawn.  All questions were answered prior to discharge.  Problems Addressed: Community acquired pneumonia of left upper lobe of lung: acute illness or injury  Amount and/or Complexity of Data Reviewed External Data Reviewed: labs, radiology, ECG and notes. Labs: ordered. Decision-making details documented in ED Course. Radiology: ordered and independent interpretation performed. Decision-making details documented in ED Course. ECG/medicine tests: ordered and independent interpretation performed. Decision-making details documented in ED Course.  Risk Prescription drug management.          Final Clinical Impression(s) / ED Diagnoses Final diagnoses:  Community acquired pneumonia of left upper lobe of lung    Rx / DC Orders ED Discharge Orders          Ordered    levofloxacin (LEVAQUIN) 750 MG tablet  Daily        04/06/22 1852              Johny Blamer, DO 04/06/22 [place], Dan, DO 04/06/22 2022

## 2022-04-06 NOTE — Progress Notes (Signed)
Chief Complaint:   OBESITY Lisa Acosta is here to discuss her progress with her obesity treatment plan along with follow-up of her obesity related diagnoses. Lisa Acosta is on the Category 1 Plan and keeping a food journal and adhering to recommended goals of 1100 calories and 75+ grams of protein and states she is following her eating plan approximately 75% of the time. Lisa Acosta states she is swimming and going to the gym 60-120 minutes 4 times per week.  Today's visit was #: 15 Starting weight: 198 lbs Starting date: 01/02/2021 Today's weight: 156 lbs Today's date: 04/04/2022 Total lbs lost to date: 42 lbs Total lbs lost since last in-office visit: 0  Interim History: Lisa Acosta is surprised that she gained weight. Struggling with nausea, saw GI and has been scheduled for EGD. Hard to eat and follow plan due to nausea. Drinking a lot of protein shakes instead of meals. Goal weight: 150 lbs.  Subjective:   1. Other specified hypothyroidism Lisa Acosta is currently taking Synthroid 125 mcg. Denies any side effects. She denies heat/cold intolerances or palpitations.  Assessment/Plan:   1. Other specified hypothyroidism Continue to follow up with PCP and continue medications as directed.  2. Obesity with current BMI of 29.6 Lisa Acosta is currently in the action stage of change. As such, her goal is to continue with weight loss efforts. She has agreed to keeping a food journal and adhering to recommended goals of 1100 calories and 75+ grams of protein.   Multiple recipes given today.  Exercise goals: As is.  Behavioral modification strategies: increasing lean protein intake, increasing water intake, and no skipping meals.  Lisa Acosta has agreed to follow-up with our clinic in 3 weeks. She was informed of the importance of frequent follow-up visits to maximize her success with intensive lifestyle modifications for her multiple health conditions.   Objective:   Blood pressure 100/64, pulse (!) 47, temperature  98.9 F (37.2 C), height '5\' 1"'$  (1.549 m), weight 156 lb (70.8 kg), SpO2 94 %. Body mass index is 29.48 kg/m.  General: Cooperative, alert, well developed, in no acute distress. HEENT: Conjunctivae and lids unremarkable. Cardiovascular: Regular rhythm.  Lungs: Normal work of breathing. Neurologic: No focal deficits.   Lab Results  Component Value Date   CREATININE 0.60 03/21/2022   BUN 21 03/21/2022   NA 141 03/21/2022   K 4.0 03/21/2022   CL 105 03/21/2022   CO2 29 03/21/2022   Lab Results  Component Value Date   ALT 34 03/21/2022   AST 36 03/21/2022   ALKPHOS 74 03/21/2022   BILITOT 0.9 03/21/2022   Lab Results  Component Value Date   HGBA1C 5.6 11/16/2021   HGBA1C 5.7 06/30/2021   HGBA1C 6.2 (H) 01/05/2021   HGBA1C 5.9 06/23/2019   HGBA1C 5.8 04/15/2018   Lab Results  Component Value Date   INSULIN 20.4 01/05/2021   Lab Results  Component Value Date   TSH 0.86 11/16/2021   Lab Results  Component Value Date   CHOL 180 11/16/2021   HDL 65.10 11/16/2021   LDLCALC 93 11/16/2021   LDLDIRECT 112.0 01/22/2017   TRIG 108.0 11/16/2021   CHOLHDL 3 11/16/2021   Lab Results  Component Value Date   VD25OH 79.10 11/16/2021   VD25OH 25.0 (L) 01/05/2021   Lab Results  Component Value Date   WBC 6.5 03/21/2022   HGB 13.5 03/21/2022   HCT 40.3 03/21/2022   MCV 88.8 03/21/2022   PLT 229.0 03/21/2022   No results found for: "  IRON", "TIBC", "FERRITIN"  Attestation Statements:   Reviewed by clinician on day of visit: allergies, medications, problem list, medical history, surgical history, family history, social history, and previous encounter notes.  Spent 30 minutes with the patient and reviewing chart before and after visit.   I, Brendell Tyus, RMA, am acting as transcriptionist for Everardo Pacific, FNP.  I have reviewed the above documentation for accuracy and completeness, and I agree with the above. Everardo Pacific, FNP

## 2022-04-06 NOTE — ED Notes (Signed)
Pt verbalizes understanding of discharge instructions. Opportunity for questions and answers were provided. Pt discharged from the ED.   ?

## 2022-04-06 NOTE — Progress Notes (Signed)
[facility] [address], [place], [place] [address] [phone]  Date:  04/06/2022   Name:  Lisa Acosta   DOB:  [birth year]   MRN:  [id number]  PCP:  Darreld Mclean, MD    Chief Complaint: Follow Up - Pain under Left Breast (Pt thinks she has an esophageal ulcer, also is having some pains in the left side. /Concerns/ questions: needs a ref to Pulm due to her sleep study results.)   History of Present Illness:  Lisa Acosta is a 71 y.o. very pleasant female patient who presents with the following:  Pt seen by myself on 7/13 as follows : Patient seen today with concern of nausea and epigastric discomfort, suspect she may have an ulcer.  She has history of esophageal ulcer in the past.  She is already taking high-dose PPI, will add Carafate.  Recent labs and ultrasound reassuring.  We are able to get her gastroenterology appointment for tomorrow with [facility] which she will attend Patient also notes short-lived, intermittent pain over her left chest wall/lower ribs.  Of note she had a negative Myoview 2 months ago.  I offered to perform further cardiac testing such as a troponin, for the time being she declines.  She will let me know if this is getting worse or not going away  We got a RUQ [place] as follows: IMPRESSION: Prominent common bile duct, at the upper limit of normal for age. Recommend correlation with LFTs. No evidence of cholecystitis.  No gallstones visualized.   Labs looked ok - done 7/11  She notes the intermittent the pain under her left breast is getting worse- lasting longer and more severe- over the last week   Her oxygen dropped during her recent sleep study although she was told she actually did not have apnea and does not need cpap any longer?  She was told to get a pulmonology referral which I will do  She is having more severe pain with swallowing- she suspects an esophageal ulcer.  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   Moderate episode of recurrent major depressive disorder ([place]) 09/26/2018   Generalized anxiety disorder 09/26/2018   Osteoporosis 01/25/2018   Osteoarthritis of hands, 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   Dyspnea 09/22/2016   OSA (obstructive sleep apnea) 08/31/2016   PAF (paroxysmal atrial fibrillation) ([place]) 03/16/2016   Hypothyroid 03/16/2016   Obesity (BMI 30-39.9) 03/16/2016   Essential hypertension 11/10/2015   Hyperlipidemia 11/10/2015    Past Medical History:  Diagnosis Date   Atrial fibrillation ([place])    Back pain    Constipation    Depression    Diverticulitis    Diverticulitis    Diverticulosis    Esophageal ulcer    Fatty liver    Gait abnormality 10/14/2018   GERD (gastroesophageal  reflux disease)    Hyperlipidemia    Hypertension    Hypothyroid    Memory difficulty 08/18/2019   OSA (obstructive sleep apnea) 08/31/2016   Osteopenia    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   Pre-diabetes     Past Surgical History:  Procedure Laterality Date   BREAST BIOPSY Left    needle core biopsy, benign   CATARACT EXTRACTION     HAND SURGERY     KNEE SURGERY     ROTATOR CUFF REPAIR     TOTAL ABDOMINAL HYSTERECTOMY      Social History   Tobacco Use   Smoking status: Never   Smokeless tobacco: Never  Vaping Use   Vaping Use: Never used  Substance Use  Topics   Alcohol use: Yes    Comment: rare   Drug use: No    Family History  Problem Relation Age of Onset   Atrial fibrillation Mother    Congestive Heart Failure Mother    Depression Mother    Heart disease Mother    Stroke Mother    Thyroid disease Mother    Anxiety disorder Mother    Obesity Mother    Heart disease Father    Alcohol abuse Father    High blood pressure Father    High Cholesterol Father    Alcoholism Father    Depression Sister    Heart disease Brother    Heart disease Brother    Depression Daughter     Allergies  Allergen Reactions   Dextran Anaphylaxis   Dextrans    Tree Extract    Penicillin G Rash   Penicillins Rash   Sulfa Antibiotics Rash   Sulfacetamide Sodium Rash    Medication list has been reviewed and updated.  Current Outpatient Medications on File Prior to Visit  Medication Sig Dispense Refill   alendronate (FOSAMAX) 70 MG tablet Take 1 tablet (70 mg total) by mouth every 7 (seven) days. Take with a full glass of water on an empty stomach. 12 tablet 3   amLODipine (NORVASC) 5 MG tablet Take 1 tablet (5 mg total) by mouth daily. 90 tablet 3   atenolol (TENORMIN) 50 MG tablet Take 1 tablet (50 mg total) by mouth daily. 90 tablet 3   flecainide (TAMBOCOR) 100 MG tablet TAKE 1 TABLET BY MOUTH 2 TIMES DAILY 180 tablet 3   FLUoxetine (PROZAC) 40 MG capsule Take 1 capsule (40 mg total) by mouth 2 (two) times daily. 180 capsule 3   fluticasone (FLONASE) 50 MCG/ACT nasal spray SPRAY 2 SPRAYS INTO EACH NOSTRIL EVERY DAY 48 mL 1   levothyroxine (SYNTHROID) 125 MCG tablet TAKE 1 TABLET BY MOUTH DAILY BEFORE BREAKFAST. 90 tablet 3   omeprazole (PRILOSEC) 40 MG capsule TAKE 1 CAPSULE BY MOUTH TWICE A DAY 180 capsule 3   rosuvastatin (CRESTOR) 20 MG tablet TAKE 1 TABLET BY MOUTH EVERY DAY 90 tablet 3   sucralfate (CARAFATE) 1 g tablet Take 1 tablet (1 g total) by mouth 4 (four) times daily -  with meals and at bedtime. 40 tablet 0   traZODone  (DESYREL) 50 MG tablet Take 1.5 tablets (75 mg total) by mouth at bedtime. 135 tablet 1   XARELTO 20 MG TABS tablet TAKE 1 TABLET BY MOUTH DAILY WITH SUPPER. 90 tablet 1   No current facility-administered medications on file prior to visit.    Review of Systems:  As per HPI- otherwise negative.   Physical Examination: Vitals:   04/06/22 1607  BP:  138/80  Pulse: 69  Resp: 18  Temp: 97.9 F (36.6 C)  SpO2: 96%   Vitals:   04/06/22 1607  Weight: 160 lb 3.2 oz (72.7 kg)   Body mass index is 30.27 kg/m. Ideal Body Weight:    GEN: no acute distress.  Mild obesity. Not in distress but does not appear to feel great either  HEENT: Atraumatic, Normocephalic.  Bilateral TM wnl, oropharynx normal.  PEERL,EOMI.   Ears and Nose: No external deformity. CV: RRR, No M/G/R. No JVD. No thrill. No extra heart sounds. PULM: CTA B, no wheezes, crackles, rhonchi. No retractions. No resp. distress. No accessory muscle use. ABD: S, NT, ND. No rebound. No HSM. Reproducible chest pain over the anterior chest wall  EXTR: No c/c/e PSYCH: Normally interactive. Conversant.   EKG; SR with bradycardia to 49, first degree AV block  Assessment and Plan: Chest pain, unspecified type - Plan: EKG 12-Lead Pt seen today with chest pain and intermittent pain under the left breast.  This is her 2nd visit for same and she has been to see her GI doctor.   We suspect this may be an esophageal or gastric ulcer but she does not have a scope appt on the horizon.  Her sx are getting worse and she has active chest discomfort.  Explained to pt that likely this is an esophageal problem, but I am concerned also about possible heart or lung pathology. She is also concerned and agrees to go to the ER.  Accompanied to the ER by staff- appreciate ED care of this nice patient  JC Signed Lamar Blinks, MD

## 2022-04-06 NOTE — Discharge Instructions (Addendum)
You were seen in the emergency department after being sent by her primary care physician to evaluate your left-sided chest pain.  On work-up found no evidence of a heart attack or blood clot in your lungs.  There was evidence on your chest x-ray and CT scan of pneumonia in your left lung.  Please take prescribed levofloxacin 750 mg daily for the next 5 days.  Please follow-up with your primary care physician for reevaluation in the next week.  Please have a follow-up chest x-ray in 3 to 4 weeks to monitor resolution of your pneumonia.  If you experience any new or worsening symptoms please return to the emergency room.

## 2022-04-06 NOTE — ED Triage Notes (Signed)
Pt from PCP c/o mid chest pain, ongoing for some time. Recently prescribed carafate and zofran for potential esophageal ulcer.  Denies ShOB. Reports intermittent difficulty swallowing due to painful swallowing.

## 2022-04-06 NOTE — ED Notes (Signed)
EKG not obtained at triage due to pt having EKG performed at PCP office immediately prior to arrival, accessible in EMR.

## 2022-04-12 ENCOUNTER — Encounter: Payer: Self-pay | Admitting: Family Medicine

## 2022-04-12 ENCOUNTER — Ambulatory Visit (INDEPENDENT_AMBULATORY_CARE_PROVIDER_SITE_OTHER): Payer: Medicare Other | Admitting: Nurse Practitioner

## 2022-04-12 DIAGNOSIS — J189 Pneumonia, unspecified organism: Secondary | ICD-10-CM

## 2022-04-19 ENCOUNTER — Encounter (INDEPENDENT_AMBULATORY_CARE_PROVIDER_SITE_OTHER): Payer: Self-pay

## 2022-04-19 DIAGNOSIS — R110 Nausea: Secondary | ICD-10-CM | POA: Diagnosis not present

## 2022-04-19 DIAGNOSIS — K2930 Chronic superficial gastritis without bleeding: Secondary | ICD-10-CM | POA: Diagnosis not present

## 2022-04-19 DIAGNOSIS — K3189 Other diseases of stomach and duodenum: Secondary | ICD-10-CM | POA: Diagnosis not present

## 2022-04-19 DIAGNOSIS — K5710 Diverticulosis of small intestine without perforation or abscess without bleeding: Secondary | ICD-10-CM | POA: Diagnosis not present

## 2022-04-19 DIAGNOSIS — K2289 Other specified disease of esophagus: Secondary | ICD-10-CM | POA: Diagnosis not present

## 2022-04-19 DIAGNOSIS — R1310 Dysphagia, unspecified: Secondary | ICD-10-CM | POA: Diagnosis not present

## 2022-04-25 ENCOUNTER — Encounter: Payer: Self-pay | Admitting: Family Medicine

## 2022-04-25 ENCOUNTER — Ambulatory Visit (HOSPITAL_BASED_OUTPATIENT_CLINIC_OR_DEPARTMENT_OTHER)
Admission: RE | Admit: 2022-04-25 | Discharge: 2022-04-25 | Disposition: A | Discharge status: Home / Self Care | Payer: Medicare Other | Source: Ambulatory Visit | Attending: Family Medicine | Admitting: Family Medicine

## 2022-04-25 ENCOUNTER — Other Ambulatory Visit: Payer: Self-pay | Admitting: Family Medicine

## 2022-04-25 DIAGNOSIS — J189 Pneumonia, unspecified organism: Secondary | ICD-10-CM | POA: Diagnosis not present

## 2022-04-25 DIAGNOSIS — R1013 Epigastric pain: Secondary | ICD-10-CM

## 2022-04-25 MED ORDER — SUCRALFATE 1 G PO TABS: 1.0000 g | ORAL_TABLET | Freq: Three times a day (TID) | ORAL | 0 refills | Status: DC

## 2022-04-26 DIAGNOSIS — K2930 Chronic superficial gastritis without bleeding: Secondary | ICD-10-CM | POA: Diagnosis not present

## 2022-04-26 DIAGNOSIS — K2289 Other specified disease of esophagus: Secondary | ICD-10-CM | POA: Diagnosis not present

## 2022-04-27 ENCOUNTER — Encounter: Payer: Self-pay | Admitting: Family Medicine

## 2022-04-27 DIAGNOSIS — K219 Gastro-esophageal reflux disease without esophagitis: Secondary | ICD-10-CM | POA: Diagnosis not present

## 2022-04-27 DIAGNOSIS — R1310 Dysphagia, unspecified: Secondary | ICD-10-CM | POA: Diagnosis not present

## 2022-04-30 ENCOUNTER — Other Ambulatory Visit: Payer: Self-pay | Admitting: Family Medicine

## 2022-04-30 DIAGNOSIS — I480 Paroxysmal atrial fibrillation: Secondary | ICD-10-CM

## 2022-05-01 ENCOUNTER — Encounter (INDEPENDENT_AMBULATORY_CARE_PROVIDER_SITE_OTHER): Payer: Self-pay | Admitting: Nurse Practitioner

## 2022-05-01 ENCOUNTER — Ambulatory Visit (INDEPENDENT_AMBULATORY_CARE_PROVIDER_SITE_OTHER): Payer: Medicare Other | Admitting: Nurse Practitioner

## 2022-05-01 VITALS — BP 110/74 | HR 47 | Temp 98.6°F | Ht 61.0 in | Wt 155.0 lb

## 2022-05-01 DIAGNOSIS — G4733 Obstructive sleep apnea (adult) (pediatric): Secondary | ICD-10-CM

## 2022-05-01 DIAGNOSIS — Z6829 Body mass index (BMI) 29.0-29.9, adult: Secondary | ICD-10-CM

## 2022-05-01 DIAGNOSIS — K219 Gastro-esophageal reflux disease without esophagitis: Secondary | ICD-10-CM

## 2022-05-01 DIAGNOSIS — E669 Obesity, unspecified: Secondary | ICD-10-CM

## 2022-05-03 ENCOUNTER — Institutional Professional Consult (permissible substitution): Payer: Medicare Other | Admitting: Internal Medicine

## 2022-05-08 DIAGNOSIS — K5900 Constipation, unspecified: Secondary | ICD-10-CM | POA: Diagnosis not present

## 2022-05-08 DIAGNOSIS — K219 Gastro-esophageal reflux disease without esophagitis: Secondary | ICD-10-CM | POA: Diagnosis not present

## 2022-05-08 DIAGNOSIS — R110 Nausea: Secondary | ICD-10-CM | POA: Diagnosis not present

## 2022-05-10 NOTE — Progress Notes (Signed)
Chief Complaint:   OBESITY Lisa Acosta is here to discuss her progress with her obesity treatment plan along with follow-up of her obesity related diagnoses. Lisa Acosta is on the Category 1 Plan and states she is following her eating plan approximately ?% of the time. Lisa Acosta states she is exercising 0 minutes 0 times per week.  Today's visit was #: 75 Starting weight: 198 lbs Starting date: 01/02/2021 Today's weight: 155 lbs Today's date: 05/01/2022 Total lbs lost to date: 43 lbs Total lbs lost since last in-office visit: 1  Interim History: Lisa Acosta has had a stressful month. Had pneumonia; struggling with GERD and her grandson left for college. Struggling with stress eating. Her other grandson is leaving soon to spend 2 months in [place]. Struggles with following plan due to GERD. Hoping to get back to the pool when feeling better. She is drinking a protein shake and muffins for breakfast.   Subjective:   1. Gastroesophageal reflux disease, unspecified whether esophagitis present Saw GI last on 04/27/22. Had EGD on 04/19/22. Has not been taking Omeprazole due to concerns of side effects of dementia. Currently taking Protonix.  2. OSA (obstructive sleep apnea) Lisa Acosta's last PSG was 03/30/22. See results  Assessment/Plan:   1. Gastroesophageal reflux disease, unspecified whether esophagitis present Oris will continue to follow up with GI. She has an appointment in 3 weeks.  2. OSA (obstructive sleep apnea) Lisa Acosta has an appointment scheduled with pulmonary this week.  3. Obesity with current BMI of 29.4 Lisa Acosta is currently in the action stage of change. As such, her goal is to continue with weight loss efforts. She has agreed to the Category 1 Plan.   Exercise goals: Older adults should follow the adult guidelines. When older adults cannot meet the adult guidelines, they should be as physically active as their abilities and conditions will allow.   Behavioral modification strategies: increasing  lean protein intake, increasing water intake, and planning for success.  Lisa Acosta has agreed to follow-up with our clinic in 4 weeks. She was informed of the importance of frequent follow-up visits to maximize her success with intensive lifestyle modifications for her multiple health conditions.   Objective:   Blood pressure 110/74, pulse (!) 47, temperature 98.6 F (37 C), height '5\' 1"'$  (1.549 m), weight 155 lb (70.3 kg), SpO2 96 %. Body mass index is 29.29 kg/m.  General: Cooperative, alert, well developed, in no acute distress. HEENT: Conjunctivae and lids unremarkable. Cardiovascular: Regular rhythm.  Lungs: Normal work of breathing. Neurologic: No focal deficits.   Lab Results  Component Value Date   CREATININE 0.62 04/06/2022   BUN 26 (H) 04/06/2022   NA 138 04/06/2022   K 3.9 04/06/2022   CL 107 04/06/2022   CO2 24 04/06/2022   Lab Results  Component Value Date   ALT 34 03/21/2022   AST 36 03/21/2022   ALKPHOS 74 03/21/2022   BILITOT 0.9 03/21/2022   Lab Results  Component Value Date   HGBA1C 5.6 11/16/2021   HGBA1C 5.7 06/30/2021   HGBA1C 6.2 (H) 01/05/2021   HGBA1C 5.9 06/23/2019   HGBA1C 5.8 04/15/2018   Lab Results  Component Value Date   INSULIN 20.4 01/05/2021   Lab Results  Component Value Date   TSH 0.86 11/16/2021   Lab Results  Component Value Date   CHOL 180 11/16/2021   HDL 65.10 11/16/2021   LDLCALC 93 11/16/2021   LDLDIRECT 112.0 01/22/2017   TRIG 108.0 11/16/2021   CHOLHDL 3 11/16/2021  Lab Results  Component Value Date   VD25OH 79.10 11/16/2021   VD25OH 25.0 (L) 01/05/2021   Lab Results  Component Value Date   WBC 7.8 04/06/2022   HGB 13.6 04/06/2022   HCT 40.2 04/06/2022   MCV 88.0 04/06/2022   PLT 231 04/06/2022   No results found for: "IRON", "TIBC", "FERRITIN"  Attestation Statements:   Reviewed by clinician on day of visit: allergies, medications, problem list, medical history, surgical history, family history, social  history, and previous encounter notes.  Time spent on visit including pre-visit chart review and post-visit care and charting was 30 minutes.   I, Brendell Tyus, RMA, am acting as transcriptionist for Everardo Pacific, FNP.  I have reviewed the above documentation for accuracy and completeness, and I agree with the above. Everardo Pacific, FNP

## 2022-05-17 ENCOUNTER — Other Ambulatory Visit: Payer: Self-pay | Admitting: Family Medicine

## 2022-05-17 DIAGNOSIS — M810 Age-related osteoporosis without current pathological fracture: Secondary | ICD-10-CM

## 2022-05-17 NOTE — Telephone Encounter (Signed)
Okay to refill/ pt was not seen since 04/06/22 and last fill of the rx is 01/24/21.

## 2022-05-18 ENCOUNTER — Ambulatory Visit (HOSPITAL_BASED_OUTPATIENT_CLINIC_OR_DEPARTMENT_OTHER)
Admission: RE | Admit: 2022-05-18 | Discharge: 2022-05-18 | Disposition: A | Discharge status: Home / Self Care | Payer: Medicare Other | Source: Ambulatory Visit | Attending: Family Medicine | Admitting: Family Medicine

## 2022-05-18 ENCOUNTER — Encounter: Payer: Self-pay | Admitting: Family Medicine

## 2022-05-18 DIAGNOSIS — J9811 Atelectasis: Secondary | ICD-10-CM | POA: Diagnosis not present

## 2022-05-18 DIAGNOSIS — J189 Pneumonia, unspecified organism: Secondary | ICD-10-CM | POA: Insufficient documentation

## 2022-05-19 MED ORDER — DOXYCYCLINE HYCLATE 100 MG PO TABS: 100.0000 mg | ORAL_TABLET | Freq: Two times a day (BID) | ORAL | 0 refills | Status: DC

## 2022-05-19 NOTE — Addendum Note (Signed)
Addended by: Lamar Blinks C on: 05/19/2022 12:25 PM   Modules accepted: Orders

## 2022-05-24 ENCOUNTER — Ambulatory Visit (HOSPITAL_BASED_OUTPATIENT_CLINIC_OR_DEPARTMENT_OTHER)
Admission: RE | Admit: 2022-05-24 | Discharge: 2022-05-24 | Disposition: A | Discharge status: Home / Self Care | Payer: Medicare Other | Source: Ambulatory Visit | Attending: Family Medicine | Admitting: Family Medicine

## 2022-05-24 ENCOUNTER — Encounter (HOSPITAL_BASED_OUTPATIENT_CLINIC_OR_DEPARTMENT_OTHER): Payer: Self-pay

## 2022-05-24 DIAGNOSIS — J189 Pneumonia, unspecified organism: Secondary | ICD-10-CM | POA: Insufficient documentation

## 2022-05-24 DIAGNOSIS — R918 Other nonspecific abnormal finding of lung field: Secondary | ICD-10-CM | POA: Diagnosis not present

## 2022-05-24 MED ORDER — IOHEXOL 300 MG/ML  SOLN
100.0000 mL | Freq: Once | INTRAMUSCULAR | Status: AC | PRN
  Administered 2022-05-24: 80 mL via INTRAVENOUS

## 2022-05-25 ENCOUNTER — Encounter: Payer: Self-pay | Admitting: Family Medicine

## 2022-05-29 ENCOUNTER — Encounter: Payer: Self-pay | Admitting: Internal Medicine

## 2022-05-29 ENCOUNTER — Telehealth: Payer: Self-pay | Admitting: Internal Medicine

## 2022-05-29 ENCOUNTER — Ambulatory Visit (INDEPENDENT_AMBULATORY_CARE_PROVIDER_SITE_OTHER): Payer: Medicare Other | Admitting: Internal Medicine

## 2022-05-29 VITALS — BP 122/74 | HR 52 | Temp 98.3°F | Ht 61.0 in | Wt 158.8 lb

## 2022-05-29 DIAGNOSIS — Z01812 Encounter for preprocedural laboratory examination: Secondary | ICD-10-CM

## 2022-05-29 DIAGNOSIS — R930 Abnormal findings on diagnostic imaging of skull and head, not elsewhere classified: Secondary | ICD-10-CM | POA: Diagnosis not present

## 2022-05-29 DIAGNOSIS — G4734 Idiopathic sleep related nonobstructive alveolar hypoventilation: Secondary | ICD-10-CM | POA: Diagnosis not present

## 2022-05-29 DIAGNOSIS — J181 Lobar pneumonia, unspecified organism: Secondary | ICD-10-CM | POA: Diagnosis not present

## 2022-05-29 DIAGNOSIS — Z23 Encounter for immunization: Secondary | ICD-10-CM

## 2022-05-29 NOTE — Patient Instructions (Addendum)
Please schedule follow up scheduled with myself in 1 months.  If my schedule is not open yet, we will contact you with a reminder closer to that time. Please call [phone] if you haven't heard from [place] a month before.   Before your next visit I would like you to have:  PET Scan - we will order this and schedule and call you with a time.   I will contact you with results. I think we are probably going to have to do a bronchoscopy to follow up on this. Tentatively first week of October. Someone will call you to schedule.  Please finish your course of antibiotics as prescribed.   We will prescribe oxygen for you at night time based on your sleep study in July.

## 2022-05-29 NOTE — Progress Notes (Signed)
Lisa Acosta    [id number]    1951-08-03  Primary Care Physician:Copland, Gay Filler, MD  Referring Physician: Darreld Mclean, [address] [place],  [place] [address] Reason for Consultation: abnormal CT Chest Date of Consultation: 05/29/2022  Chief complaint:   Chief Complaint  Patient presents with   Consult    Pneumonia in July 2023, cough left sided pain      HPI:  Lisa Acosta is a 71 y.o. woman who presents for new patient evaluation of left chest pain and an abnormal CT Chest.   She was diagnosed with left upper lobe pneumonia in July 2023. She was evaluated in the ED and found to have LUL consolidation consistent with pneumonia and was treated with a course of abx. She had repeat imaging 6 weeks later which shows persistent LUL consolidation despite two outpatient rounds of abx.   Biggest symptom is persistent pain in the left side, with cough and scant mucus usually white. She had a low grade fever at initial diagnosis in July when she was in the ED 99.1 F. She denies dyspnea. Has difficulty swallowing. No wheezing.  She reports having a light cough occasionally but this persistent cough has been going on since July. She wakes up due to nocturia. She had a sleep study and no longer has sleep apnea after intentional weight loss but does have nocturnal hypoxemia.   She notes worsening fatigue, decreased activity level for the past two months. She would like to get back to former activity level. She has a history of falls and ambulates with walker.   She has had bronchitis in the past but no recurrent pna and denies childhood respiratory disease or hospitalization for respiratory illness.   No previous history of cancer or significant family history. UTD on age appropriate cancer screening - due for mammo soon.   Social history:  Occupation: retired, used to work as Web designer  Exposures: lives at home with  husband - daughter, Son in law Smoking history: never smoker, had passive smoke exposure in spouse and parents.   Social History   Occupational History   Occupation: Retired    Fish farm manager: OTHER    Comment: Radiographer, therapeutic  Tobacco Use   Smoking status: Never   Smokeless tobacco: Never  Vaping Use   Vaping Use: Never used  Substance and Sexual Activity   Alcohol use: Yes    Comment: rare   Drug use: No   Sexual activity: Not on file    Relevant family history:  Family History  Problem Relation Age of Onset   Atrial fibrillation Mother    Congestive Heart Failure Mother    Depression Mother    Heart disease Mother    Stroke Mother    Thyroid disease Mother    Anxiety disorder Mother    Obesity Mother    Heart disease Father    Alcohol abuse Father    High blood pressure Father    High Cholesterol Father    Alcoholism Father    Depression Sister    Heart disease Brother    Heart disease Brother    Depression Daughter     Past Medical History:  Diagnosis Date   Atrial fibrillation ([place])    Back pain    Constipation    Depression    Diverticulitis    Diverticulitis    Diverticulosis    Esophageal ulcer  Fatty liver    Gait abnormality 10/14/2018   GERD (gastroesophageal reflux disease)    Hyperlipidemia    Hypertension    Hypothyroid    Memory difficulty 08/18/2019   OSA (obstructive sleep apnea) 08/31/2016   Osteopenia    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 Gen:      No acute distress ENT:  no nasal polyps, mucus  membranes moist Lungs:    No increased respiratory effort, symmetric chest wall excursion, breath sounds diminished left upper lung field.  CV:         Regular rate and rhythm; no murmurs, rubs, or gallops.  No pedal edema Abd:      + bowel sounds; soft, non-tender; no distension MSK: no acute synovitis of DIP or PIP joints, no mechanics hands.  Skin:      Warm and dry; no rashes Neuro: normal speech, no focal facial asymmetry Psych: alert and oriented x3, normal mood and affect   Data Reviewed/Medical Decision Making:  Independent interpretation of tests: Imaging:  Review of patient's CT Chest images revealed persistent LUL consolidation from July to September with reactive lymphadenopathy. The patient's images have been independently reviewed by me.    PFTs: I have personally reviewed the patient's PFTs and normal pft 2017    Latest Ref Rng & Units 08/29/2016    4:08 PM  PFT Results  FVC-Pre L 2.57   FVC-Predicted Pre % 93   FVC-Post L 2.63   FVC-Predicted Post % 95   Pre FEV1/FVC % % 83   Post FEV1/FCV % % 84   FEV1-Pre L 2.13   FEV1-Predicted Pre % 101   FEV1-Post L 2.21   DLCO uncorrected ml/min/mmHg 15.17   DLCO UNC% % 75   DLVA Predicted % 86   TLC L 4.20   TLC % Predicted % 91   RV % Predicted % 73     Labs:  Lab Results  Component Value Date   WBC 7.8 04/06/2022   HGB 13.6 04/06/2022   HCT 40.2 04/06/2022   MCV 88.0 04/06/2022   PLT 231 04/06/2022   Lab Results  Component Value Date   NA 138 04/06/2022   K 3.9 04/06/2022   CL 107 04/06/2022   CO2 24 04/06/2022     Immunization status:  Immunization History  Administered Date(s) Administered   Fluad Quad(high Dose 65+) 05/02/2019, 06/23/2020, 06/30/2021   Influenza, High Dose Seasonal PF 06/14/2016, 05/15/2018   Influenza,inj,Quad PF,6+ Mos 06/11/2014   Influenza,inj,quad, With Preservative 06/11/2017   PFIZER(Purple Top)SARS-COV-2 Vaccination 10/03/2019, 10/24/2019, 06/17/2020, 03/16/2021    Pfizer Covid-19 Vaccine Bivalent Booster 41yr & up 08/02/2021   Pneumococcal Conjugate-13 08/07/2016   Pneumococcal Polysaccharide-23 01/28/2018   Tdap 01/28/2018     I reviewed prior external note(s) from ED, PCP  I reviewed the result(s) of the labs and imaging as noted above.   I have ordered PET scan  Discussion of management or test interpretation with another colleague.  Assessment:  Abnormal CT Chest Cough, chest pain Nocturnal Hypoxemia Encounter for influenza vaccination  Plan/Recommendations: PET Scan - we will order this and schedule and call you with a time.   I will contact you with results. I think we are probably going to have to do a bronchoscopy to follow up on this. Tentatively first week of October. Someone will call you to schedule.  Please finish your course of antibiotics as prescribed.  Performed ambulatory desaturation study and she dropped from 98% to 92%. However, We will prescribe oxygen for you at night time based on your sleep study in July. Suspect this could be related to her ongoing pneumonia process.   Flu shot given today.  We discussed disease management and progression at length today. Bronchoscopy, risks and benefits discussed and patient was agreeable to proceed.    Return to Care: Return in about 4 weeks (around 06/26/2022).  Lenice Llamas, MD Pulmonary and [place]  CC: Copland, Gay Filler, MD

## 2022-05-29 NOTE — H&P (View-Only) (Signed)
[place]    [id number]    [birth year]  Primary Care Physician:Copland, Gay Filler, MD  Referring Physician: Darreld Mclean, [address] [place],  [place] [address] Reason for Consultation: abnormal CT Chest Date of Consultation: 05/29/2022  Chief complaint:   Chief Complaint  Patient presents with   Consult    Pneumonia in July 2023, cough left sided pain      HPI:  Lisa Acosta is a 71 y.o. woman who presents for new patient evaluation of left chest pain and an abnormal CT Chest.   She was diagnosed with left upper lobe pneumonia in July 2023. She was evaluated in the ED and found to have LUL consolidation consistent with pneumonia and was treated with a course of abx. She had repeat imaging 6 weeks later which shows persistent LUL consolidation despite two outpatient rounds of abx.   Biggest symptom is persistent pain in the left side, with cough and scant mucus usually white. She had a low grade fever at initial diagnosis in July when she was in the ED 99.1 F. She denies dyspnea. Has difficulty swallowing. No wheezing.  She reports having a light cough occasionally but this persistent cough has been going on since July. She wakes up due to nocturia. She had a sleep study and no longer has sleep apnea after intentional weight loss but does have nocturnal hypoxemia.   She notes worsening fatigue, decreased activity level for the past two months. She would like to get back to former activity level. She has a history of falls and ambulates with walker.   She has had bronchitis in the past but no recurrent pna and denies childhood respiratory disease or hospitalization for respiratory illness.   No previous history of cancer or significant family history. UTD on age appropriate cancer screening - due for mammo soon.   Social history:  Occupation: retired, used to work as Web designer  Exposures: lives at home with  husband - daughter, Son in law Smoking history: never smoker, had passive smoke exposure in spouse and parents.   Social History   Occupational History   Occupation: Retired    Fish farm manager: OTHER    Comment: Radiographer, therapeutic  Tobacco Use   Smoking status: Never   Smokeless tobacco: Never  Vaping Use   Vaping Use: Never used  Substance and Sexual Activity   Alcohol use: Yes    Comment: rare   Drug use: No   Sexual activity: Not on file    Relevant family history:  Family History  Problem Relation Age of Onset   Atrial fibrillation Mother    Congestive Heart Failure Mother    Depression Mother    Heart disease Mother    Stroke Mother    Thyroid disease Mother    Anxiety disorder Mother    Obesity Mother    Heart disease Father    Alcohol abuse Father    High blood pressure Father    High Cholesterol Father    Alcoholism Father    Depression Sister    Heart disease Brother    Heart disease Brother    Depression Daughter     Past Medical History:  Diagnosis Date   Atrial fibrillation ([place])    Back pain    Constipation    Depression    Diverticulitis    Diverticulitis    Diverticulosis    Esophageal ulcer  Fatty liver    Gait abnormality 10/14/2018   GERD (gastroesophageal reflux disease)    Hyperlipidemia    Hypertension    Hypothyroid    Memory difficulty 08/18/2019   OSA (obstructive sleep apnea) 08/31/2016   Osteopenia    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 Gen:      No acute distress ENT:  no nasal polyps, mucus  membranes moist Lungs:    No increased respiratory effort, symmetric chest wall excursion, breath sounds diminished left upper lung field.  CV:         Regular rate and rhythm; no murmurs, rubs, or gallops.  No pedal edema Abd:      + bowel sounds; soft, non-tender; no distension MSK: no acute synovitis of DIP or PIP joints, no mechanics hands.  Skin:      Warm and dry; no rashes Neuro: normal speech, no focal facial asymmetry Psych: alert and oriented x3, normal mood and affect   Data Reviewed/Medical Decision Making:  Independent interpretation of tests: Imaging:  Review of patient's CT Chest images revealed persistent LUL consolidation from July to September with reactive lymphadenopathy. The patient's images have been independently reviewed by me.    PFTs: I have personally reviewed the patient's PFTs and normal pft 2017    Latest Ref Rng & Units 08/29/2016    4:08 PM  PFT Results  FVC-Pre L 2.57   FVC-Predicted Pre % 93   FVC-Post L 2.63   FVC-Predicted Post % 95   Pre FEV1/FVC % % 83   Post FEV1/FCV % % 84   FEV1-Pre L 2.13   FEV1-Predicted Pre % 101   FEV1-Post L 2.21   DLCO uncorrected ml/min/mmHg 15.17   DLCO UNC% % 75   DLVA Predicted % 86   TLC L 4.20   TLC % Predicted % 91   RV % Predicted % 73     Labs:  Lab Results  Component Value Date   WBC 7.8 04/06/2022   HGB 13.6 04/06/2022   HCT 40.2 04/06/2022   MCV 88.0 04/06/2022   PLT 231 04/06/2022   Lab Results  Component Value Date   NA 138 04/06/2022   K 3.9 04/06/2022   CL 107 04/06/2022   CO2 24 04/06/2022     Immunization status:  Immunization History  Administered Date(s) Administered   Fluad Quad(high Dose 65+) 05/02/2019, 06/23/2020, 06/30/2021   Influenza, High Dose Seasonal PF 06/14/2016, 05/15/2018   Influenza,inj,Quad PF,6+ Mos 06/11/2014   Influenza,inj,quad, With Preservative 06/11/2017   PFIZER(Purple Top)SARS-COV-2 Vaccination 10/03/2019, 10/24/2019, 06/17/2020, 03/16/2021    Pfizer Covid-19 Vaccine Bivalent Booster 61yr & up 08/02/2021   Pneumococcal Conjugate-13 08/07/2016   Pneumococcal Polysaccharide-23 01/28/2018   Tdap 01/28/2018     I reviewed prior external note(s) from ED, PCP  I reviewed the result(s) of the labs and imaging as noted above.   I have ordered PET scan  Discussion of management or test interpretation with another colleague.  Assessment:  Abnormal CT Chest Cough, chest pain Nocturnal Hypoxemia Encounter for influenza vaccination  Plan/Recommendations: PET Scan - we will order this and schedule and call you with a time.   I will contact you with results. I think we are probably going to have to do a bronchoscopy to follow up on this. Tentatively first week of October. Someone will call you to schedule.  Please finish your course of antibiotics as prescribed.  Performed ambulatory desaturation study and she dropped from 98% to 92%. However, We will prescribe oxygen for you at night time based on your sleep study in July. Suspect this could be related to her ongoing pneumonia process.   Flu shot given today.  We discussed disease management and progression at length today. Bronchoscopy, risks and benefits discussed and patient was agreeable to proceed.    Return to Care: Return in about 4 weeks (around 06/26/2022).  Lenice Llamas, MD Pulmonary and [place]  CC: Copland, Gay Filler, MD

## 2022-05-29 NOTE — Addendum Note (Signed)
Addended by: Gavin Potters R on: 05/29/2022 11:22 AM   Modules accepted: Orders

## 2022-05-30 NOTE — Telephone Encounter (Signed)
Yes thanks she need to stop xarelto 2 days before the procedure (the two doses leading up to procedure.)

## 2022-05-30 NOTE — Telephone Encounter (Signed)
Dr Shearon Stalls,  When would you like patient to stop blood thinner before bronch procedure  Please advise

## 2022-05-31 ENCOUNTER — Ambulatory Visit: Payer: Self-pay | Admitting: Bariatrics

## 2022-05-31 NOTE — Telephone Encounter (Signed)
Please see Dr Shearon Stalls note  Thank you

## 2022-06-01 NOTE — Telephone Encounter (Signed)
I called the patient and left  a detailed message per DPR and for her to call the office back if she has any questions.

## 2022-06-07 ENCOUNTER — Encounter (HOSPITAL_COMMUNITY)
Admission: RE | Admit: 2022-06-07 | Discharge: 2022-06-07 | Disposition: A | Discharge status: Home / Self Care | Payer: Medicare Other | Source: Ambulatory Visit | Attending: Internal Medicine | Admitting: Internal Medicine

## 2022-06-07 DIAGNOSIS — R930 Abnormal findings on diagnostic imaging of skull and head, not elsewhere classified: Secondary | ICD-10-CM | POA: Diagnosis not present

## 2022-06-07 DIAGNOSIS — J181 Lobar pneumonia, unspecified organism: Secondary | ICD-10-CM | POA: Diagnosis not present

## 2022-06-07 DIAGNOSIS — R918 Other nonspecific abnormal finding of lung field: Secondary | ICD-10-CM | POA: Diagnosis not present

## 2022-06-07 LAB — GLUCOSE, CAPILLARY: Glucose-Capillary: 109 mg/dL — ABNORMAL HIGH (ref 70–99)

## 2022-06-07 MED ORDER — FLUDEOXYGLUCOSE F - 18 (FDG) INJECTION
7.8000 | Freq: Once | INTRAVENOUS | Status: AC
  Administered 2022-06-07: 7.9 via INTRAVENOUS

## 2022-06-12 ENCOUNTER — Other Ambulatory Visit: Payer: Medicare Other

## 2022-06-12 DIAGNOSIS — Z01812 Encounter for preprocedural laboratory examination: Secondary | ICD-10-CM

## 2022-06-13 ENCOUNTER — Other Ambulatory Visit: Payer: Self-pay

## 2022-06-13 ENCOUNTER — Encounter (HOSPITAL_COMMUNITY): Payer: Self-pay | Admitting: Internal Medicine

## 2022-06-13 ENCOUNTER — Telehealth: Payer: Self-pay | Admitting: Internal Medicine

## 2022-06-13 NOTE — Progress Notes (Signed)
S.D.W- Instructions   Your procedure is scheduled on Thurs., Oct. 5, 2023 from 11:75m12:45pm.  [facility] at 9:00 A.M., then check in with the Admitting office.  Call this number if you have problems the morning of surgery:  [phone]  Remember:  Do not eat or drink after midnight on Oct. 4th    Take these medicines the morning of surgery with A SIP OF WATER:  amLODipine (NORVASC) flecainide (TAMBOCOR) FLUoxetine (PROZAC) levothyroxine (SYNTHROID) omeprazole (PRILOSEC) rosuvastatin (CRESTOR)  ondansetron (ZOFRAN) Propylene Glycol (SYSTANE BALANCE)  Take last dose of Xarelto on 10/2  As of today, STOP taking any Aspirin (unless otherwise instructed by your surgeon) Aleve, Naproxen, Ibuprofen, Motrin, Advil, Goody's, BC's, all herbal medications, fish oil, and all vitamins.  Do not wear jewelry or makeup. Do not wear lotions, powders, perfumes or deodorant. Do not shave 48 hours prior to surgery.   Do not bring valuables to the hospital. Do not wear nail polish, gel polish, artificial nails, or any other type of covering on natural nails (fingers and toes) If you have artificial nails or gel coating that need to be removed by a nail salon, please have this removed prior to surgery. Artificial nails or gel coating may interfere with anesthesia's ability to adequately monitor your vital signs.  [facility] is not responsible for any belongings or valuables.    Do NOT Smoke (Tobacco/Vaping)  24 hours prior to your procedure  If you use a CPAP at night, you may bring your mask for your overnight stay.   Contacts, glasses, hearing aids, dentures or partials may not be worn into surgery, please bring cases for these belongings   For patients admitted to the hospital, discharge time will be determined by your treatment team.   Patients discharged the day of surgery will not be allowed to drive home, and someone needs to stay with them for 24  hours.  Special instructions:    Oral Hygiene is also important to reduce your risk of infection.  Remember - BRUSH YOUR TEETH THE MORNING OF SURGERY WITH YOUR REGULAR TOOTHPASTE  [facility]- Preparing For Surgery  Before surgery, you can play an important role. Because skin is not sterile, your skin needs to be as free of germs as possible. You can reduce the number of germs on your skin by washing with Antibacterial Soap before surgery.     Please follow these instructions carefully.     Shower the NIGHT BEFORE SURGERY and the MORNING OF SURGERY with Antibacterial Soap.   Pat yourself dry with a CLEAN TOWEL.  Wear CLEAN PAJAMAS to bed the night before surgery  Place CLEAN SHEETS on your bed the night before your surgery  DO NOT SLEEP WITH PETS.  Day of Surgery:  Take a shower with Antibacterial soap. Wear Clean/Comfortable clothing the morning of surgery Do not apply any deodorants/lotions.   Remember to brush your teeth WITH YOUR REGULAR TOOTHPASTE.   If you test positive for Covid, or been in contact with anyone that has tested positive in the last 10 days, please notify your surgeon.  SURGICAL WAITING ROOM VISITATION Patients having surgery or a procedure may have no more than 2 support people in the waiting area - these visitors may rotate.   Children under the age of 161must have an adult with them who is not the patient. If the patient needs to stay at the hospital during part of their recovery, the visitor guidelines for  inpatient rooms apply. Pre-op nurse will coordinate an appropriate time for 1 support person to accompany patient in pre-op.  This support person may not rotate.   Please refer to the [facility] website for the visitor guidelines for Inpatients (after your surgery is over and you are in a regular room).

## 2022-06-13 NOTE — Progress Notes (Signed)
PCP - Dr. Edilia Bo  Cardiologist - Dr. Stanford Breed  EP- Denies  Endocrine- Denies  Pulm- Denies  Chest x-ray - 05/18/22 (E)  EKG - 04/06/22 (E)  Stress Test - 01/16/22 (CE)  ECHO - 11/12/15 (E)  Cardiac Cath - Denies  AICD-na PM-na LOOP-na  Nerve Stimulator- Denies  Dialysis- Denies  Sleep Study - Yes- Positive CPAP - Denies, but uses Oxygen at bedtime  LABS- 06/15/22: CBC, BMP 06/12/22: COVID (pending)  ASA-Denies XARELTO- LD 10/2  ERAS- No  HA1C- Denies  Anesthesia- No  Pt denies having chest pain, sob, or fever during the pre-op phone call. All instructions explained to the pt, with a verbal understanding of the material. Pt also instructed to wear a mask and social distance after being tested for COVID-19. The opportunity to ask questions was provided.

## 2022-06-14 LAB — SPECIMEN STATUS REPORT

## 2022-06-14 LAB — NOVEL CORONAVIRUS, NAA: SARS-CoV-2, NAA: NOT DETECTED

## 2022-06-14 NOTE — Telephone Encounter (Signed)
Spoke with the pt  She states that someone from the hospital called her to discuss bronch details  Nothing further needed

## 2022-06-15 ENCOUNTER — Ambulatory Visit (HOSPITAL_COMMUNITY)
Admission: RE | Admit: 2022-06-15 | Discharge: 2022-06-15 | Disposition: A | Discharge status: Home / Self Care | Payer: Medicare Other | Attending: Internal Medicine | Admitting: Internal Medicine

## 2022-06-15 ENCOUNTER — Other Ambulatory Visit: Payer: Self-pay

## 2022-06-15 ENCOUNTER — Ambulatory Visit (HOSPITAL_COMMUNITY): Payer: Medicare Other | Admitting: Anesthesiology

## 2022-06-15 ENCOUNTER — Encounter (HOSPITAL_COMMUNITY)
Admission: RE | Disposition: A | Discharge status: Home / Self Care | Payer: Self-pay | Source: Home / Self Care | Attending: Internal Medicine

## 2022-06-15 ENCOUNTER — Ambulatory Visit (HOSPITAL_BASED_OUTPATIENT_CLINIC_OR_DEPARTMENT_OTHER): Payer: Medicare Other | Admitting: Anesthesiology

## 2022-06-15 ENCOUNTER — Encounter (HOSPITAL_COMMUNITY): Payer: Self-pay | Admitting: Internal Medicine

## 2022-06-15 DIAGNOSIS — R351 Nocturia: Secondary | ICD-10-CM | POA: Diagnosis not present

## 2022-06-15 DIAGNOSIS — I10 Essential (primary) hypertension: Secondary | ICD-10-CM | POA: Insufficient documentation

## 2022-06-15 DIAGNOSIS — I898 Other specified noninfective disorders of lymphatic vessels and lymph nodes: Secondary | ICD-10-CM | POA: Insufficient documentation

## 2022-06-15 DIAGNOSIS — I4891 Unspecified atrial fibrillation: Secondary | ICD-10-CM

## 2022-06-15 DIAGNOSIS — J181 Lobar pneumonia, unspecified organism: Secondary | ICD-10-CM | POA: Diagnosis not present

## 2022-06-15 DIAGNOSIS — R059 Cough, unspecified: Secondary | ICD-10-CM | POA: Diagnosis not present

## 2022-06-15 DIAGNOSIS — Z8701 Personal history of pneumonia (recurrent): Secondary | ICD-10-CM | POA: Insufficient documentation

## 2022-06-15 DIAGNOSIS — K219 Gastro-esophageal reflux disease without esophagitis: Secondary | ICD-10-CM | POA: Insufficient documentation

## 2022-06-15 DIAGNOSIS — Z79899 Other long term (current) drug therapy: Secondary | ICD-10-CM | POA: Diagnosis not present

## 2022-06-15 DIAGNOSIS — J189 Pneumonia, unspecified organism: Secondary | ICD-10-CM | POA: Diagnosis not present

## 2022-06-15 DIAGNOSIS — R0902 Hypoxemia: Secondary | ICD-10-CM | POA: Insufficient documentation

## 2022-06-15 DIAGNOSIS — R590 Localized enlarged lymph nodes: Secondary | ICD-10-CM

## 2022-06-15 DIAGNOSIS — R079 Chest pain, unspecified: Secondary | ICD-10-CM | POA: Diagnosis not present

## 2022-06-15 DIAGNOSIS — I889 Nonspecific lymphadenitis, unspecified: Secondary | ICD-10-CM | POA: Diagnosis not present

## 2022-06-15 DIAGNOSIS — R918 Other nonspecific abnormal finding of lung field: Secondary | ICD-10-CM | POA: Insufficient documentation

## 2022-06-15 DIAGNOSIS — Z9181 History of falling: Secondary | ICD-10-CM | POA: Diagnosis not present

## 2022-06-15 DIAGNOSIS — R5383 Other fatigue: Secondary | ICD-10-CM | POA: Diagnosis not present

## 2022-06-15 DIAGNOSIS — F418 Other specified anxiety disorders: Secondary | ICD-10-CM | POA: Diagnosis not present

## 2022-06-15 DIAGNOSIS — R846 Abnormal cytological findings in specimens from respiratory organs and thorax: Secondary | ICD-10-CM | POA: Diagnosis not present

## 2022-06-15 HISTORY — PX: BRONCHIAL NEEDLE ASPIRATION BIOPSY: SHX5106

## 2022-06-15 HISTORY — PX: BRONCHIAL WASHINGS: SHX5105

## 2022-06-15 HISTORY — DX: Other specified postprocedural states: Z98.890

## 2022-06-15 HISTORY — PX: BRONCHIAL BIOPSY: SHX5109

## 2022-06-15 HISTORY — PX: VIDEO BRONCHOSCOPY: SHX5072

## 2022-06-15 HISTORY — PX: VIDEO BRONCHOSCOPY WITH ENDOBRONCHIAL ULTRASOUND: SHX6177

## 2022-06-15 HISTORY — DX: Other complications of anesthesia, initial encounter: T88.59XA

## 2022-06-15 HISTORY — DX: Nausea with vomiting, unspecified: R11.2

## 2022-06-15 LAB — CBC
HCT: 40.1 % (ref 36.0–46.0)
Hemoglobin: 13.1 g/dL (ref 12.0–15.0)
MCH: 29.6 pg (ref 26.0–34.0)
MCHC: 32.7 g/dL (ref 30.0–36.0)
MCV: 90.5 fL (ref 80.0–100.0)
Platelets: 239 10*3/uL (ref 150–400)
RBC: 4.43 MIL/uL (ref 3.87–5.11)
RDW: 12.5 % (ref 11.5–15.5)
WBC: 6.7 10*3/uL (ref 4.0–10.5)
nRBC: 0 % (ref 0.0–0.2)

## 2022-06-15 LAB — BODY FLUID CELL COUNT WITH DIFFERENTIAL
Eos, Fluid: 1 %
Lymphs, Fluid: 8 %
Monocyte-Macrophage-Serous Fluid: 70 % (ref 50–90)
Neutrophil Count, Fluid: 21 % (ref 0–25)
Total Nucleated Cell Count, Fluid: 14 cu mm (ref 0–1000)

## 2022-06-15 LAB — BASIC METABOLIC PANEL WITH GFR
Anion gap: 7 (ref 5–15)
BUN: 15 mg/dL (ref 8–23)
CO2: 23 mmol/L (ref 22–32)
Calcium: 8.6 mg/dL — ABNORMAL LOW (ref 8.9–10.3)
Chloride: 107 mmol/L (ref 98–111)
Creatinine, Ser: 0.66 mg/dL (ref 0.44–1.00)
GFR, Estimated: >60 mL/min
Glucose, Bld: 120 mg/dL — ABNORMAL HIGH (ref 70–99)
Potassium: 3.9 mmol/L (ref 3.5–5.1)
Sodium: 137 mmol/L (ref 135–145)

## 2022-06-15 SURGERY — BRONCHOSCOPY, WITH EBUS
Anesthesia: Monitor Anesthesia Care

## 2022-06-15 SURGERY — BRONCHOSCOPY, WITH FLUOROSCOPY
Anesthesia: General

## 2022-06-15 MED ORDER — LIDOCAINE 2% (20 MG/ML) 5 ML SYRINGE
INTRAMUSCULAR | Status: DC | PRN
  Administered 2022-06-15: 100 mg via INTRAVENOUS

## 2022-06-15 MED ORDER — DEXAMETHASONE SODIUM PHOSPHATE 10 MG/ML IJ SOLN
INTRAMUSCULAR | Status: DC | PRN
  Administered 2022-06-15: 5 mg via INTRAVENOUS

## 2022-06-15 MED ORDER — SUGAMMADEX SODIUM 200 MG/2ML IV SOLN
INTRAVENOUS | Status: DC | PRN
  Administered 2022-06-15: 200 mg via INTRAVENOUS

## 2022-06-15 MED ORDER — CHLORHEXIDINE GLUCONATE 0.12 % MT SOLN
OROMUCOSAL | Status: AC
  Administered 2022-06-15: 15 mL
  Filled 2022-06-15: qty 15

## 2022-06-15 MED ORDER — PROPOFOL 10 MG/ML IV BOLUS
INTRAVENOUS | Status: DC | PRN
  Administered 2022-06-15: 150 mg via INTRAVENOUS
  Administered 2022-06-15: 20 mg via INTRAVENOUS

## 2022-06-15 MED ORDER — LACTATED RINGERS IV SOLN: INTRAVENOUS | Status: DC

## 2022-06-15 MED ORDER — ACETAMINOPHEN 500 MG PO TABS
1000.0000 mg | ORAL_TABLET | Freq: Once | ORAL | Status: AC
  Administered 2022-06-15: 1000 mg via ORAL
  Filled 2022-06-15: qty 2

## 2022-06-15 MED ORDER — ONDANSETRON HCL 4 MG/2ML IJ SOLN
INTRAMUSCULAR | Status: DC | PRN
  Administered 2022-06-15: 4 mg via INTRAVENOUS

## 2022-06-15 MED ORDER — FENTANYL CITRATE (PF) 100 MCG/2ML IJ SOLN
INTRAMUSCULAR | Status: DC | PRN
  Administered 2022-06-15 (×2): 50 ug via INTRAVENOUS

## 2022-06-15 MED ORDER — ROCURONIUM BROMIDE 10 MG/ML (PF) SYRINGE
PREFILLED_SYRINGE | INTRAVENOUS | Status: DC | PRN
  Administered 2022-06-15: 60 mg via INTRAVENOUS

## 2022-06-15 NOTE — Anesthesia Procedure Notes (Signed)
Procedure Name: Intubation Date/Time: 06/15/2022 11:39 AM  Performed by: Amadeo Garnet, CRNAPre-anesthesia Checklist: Patient identified, Emergency Drugs available, Suction available and Patient being monitored Patient Re-evaluated:Patient Re-evaluated prior to induction Oxygen Delivery Method: Circle system utilized Preoxygenation: Pre-oxygenation with 100% oxygen Induction Type: IV induction Ventilation: Mask ventilation without difficulty Laryngoscope Size: Mac and 3 Grade View: Grade I Tube type: Oral Tube size: 8.0 mm Number of attempts: 1 Airway Equipment and Method: Stylet and Oral airway Placement Confirmation: ETT inserted through vocal cords under direct vision, positive ETCO2 and breath sounds checked- equal and bilateral Secured at: 21 cm Tube secured with: Tape Dental Injury: Teeth and Oropharynx as per pre-operative assessment

## 2022-06-15 NOTE — Anesthesia Preprocedure Evaluation (Addendum)
Anesthesia Evaluation  Patient identified by MRN, date of birth, ID band Patient awake    Reviewed: Allergy & Precautions, H&P , NPO status , Patient's Chart, lab work & pertinent test results, reviewed documented beta blocker date and time   History of Anesthesia Complications (+) PONV and history of anesthetic complications  Airway Mallampati: III  TM Distance: >3 FB Neck ROM: Full    Dental no notable dental hx. (+) Teeth Intact, Dental Advisory Given   Pulmonary sleep apnea and Oxygen sleep apnea    Pulmonary exam normal breath sounds clear to auscultation       Cardiovascular hypertension, Pt. on medications and Pt. on home beta blockers + dysrhythmias Atrial Fibrillation  Rhythm:Regular Rate:Normal     Neuro/Psych   Anxiety Depression    negative neurological ROS     GI/Hepatic Neg liver ROS, PUD,GERD  Medicated,,  Endo/Other  Hypothyroidism    Renal/GU negative Renal ROS  negative genitourinary   Musculoskeletal  (+) Arthritis , Osteoarthritis,    Abdominal   Peds  Hematology negative hematology ROS (+)   Anesthesia Other Findings   Reproductive/Obstetrics negative OB ROS                             Anesthesia Physical Anesthesia Plan  ASA: 3  Anesthesia Plan: General   Post-op Pain Management: Tylenol PO (pre-op)*   Induction: Intravenous  PONV Risk Score and Plan: 4 or greater and Ondansetron, Dexamethasone, Treatment may vary due to age or medical condition, Propofol infusion and TIVA  Airway Management Planned: Oral ETT  Additional Equipment:   Intra-op Plan:   Post-operative Plan: Extubation in OR  Informed Consent: I have reviewed the patients History and Physical, chart, labs and discussed the procedure including the risks, benefits and alternatives for the proposed anesthesia with the patient or authorized representative who has indicated his/her understanding  and acceptance.     Dental advisory given  Plan Discussed with: CRNA  Anesthesia Plan Comments:         Anesthesia Quick Evaluation

## 2022-06-15 NOTE — Transfer of Care (Signed)
Immediate Anesthesia Transfer of Care Note  Patient: Lisa Acosta  Procedure(s) Performed: VIDEO BRONCHOSCOPY WITH FLUORO, BRONCHOSCOPY WITH BAL, TRANSBRONCHIAL BIOPIES, POSSIBLE VIDEO BRONCHOSCOPY WITH ENDOBRONCHIAL ULTRASOUND BRONCHIAL BIOPSIES BRONCHIAL NEEDLE ASPIRATION BIOPSIES BRONCHIAL WASHINGS  Patient Location: PACU  Anesthesia Type:General  Level of Consciousness: drowsy and patient cooperative  Airway & Oxygen Therapy: Patient Spontanous Breathing and Patient connected to nasal cannula oxygen  Post-op Assessment: Report given to RN, Post -op Vital signs reviewed and stable, and Patient moving all extremities  Post vital signs: Reviewed and stable  Last Vitals:  Vitals Value Taken Time  BP 140/91 06/15/22 1315  Temp    Pulse 61 06/15/22 1316  Resp 12 06/15/22 1316  SpO2 90 % 06/15/22 1316  Vitals shown include unvalidated device data.  Last Pain:  Vitals:   06/15/22 0928  TempSrc:   PainSc: 0-No pain      Patients Stated Pain Goal: 3 (20/25/42 7062)  Complications: No notable events documented.

## 2022-06-15 NOTE — Discharge Instructions (Signed)
Ok to resume Financial controller.

## 2022-06-15 NOTE — Interval H&P Note (Signed)
Patient presents for bronchoscopy with EBUS for lung mass and pet avid hilar adenopathy. Has been on anticoagulation which is held. Ok to proceed with procedure as discussed.  Lenice Llamas, MD Pulmonary and [place] 06/15/2022 9:51 AM Pager: see AMION  If no response to pager, please call critical care on call (see AMION) until 7pm After 7:00 pm call Elink

## 2022-06-15 NOTE — Op Note (Signed)
[facility] Cardiopulmonary Patient Name: Lisa Acosta Date: 06/15/2022 MRN: [id number] Attending MD: Senaida Ores. Shearon Stalls MD, MD Date of Birth: 1950-10-18 CSN: Finalized Age: 71 Admit Type: Outpatient Gender: Female Procedure:             Bronchoscopy Indications:           Unresolving left upper lobe infiltrate, Hilar                         lymphadenopathy of the left side Providers:             Johnn Krasowski S. Shearon Stalls MD, MD, Glori Bickers, RN, Cherylynn Ridges, Technician, Gloris Ham, Technician Referring MD:           Medicines:             General Anesthesia, general anesthesia Complications:         No immediate complications. Estimated blood loss:                         Minimal Estimated Blood Loss:  Estimated blood loss was minimal. Procedure:             Pre-Anesthesia Assessment:                        - A History and Physical has been performed. The                         patient's medications, allergies and sensitivities                         have been reviewed.                        After obtaining informed consent, the bronchoscope was                         passed under direct vision. Throughout the procedure,                         the patient's blood pressure, pulse, and oxygen                         saturations were monitored continuously. the                         Bronchoscope was introduced through the mouth, via the                         endotracheal tube (the patient was intubated for the                         procedure) and advanced to the tracheobronchial tree.                         Initially the EBUS scope was inserted. I was able to  visualize the 4L and 10L lymph nodes. EBUS TBNA was                         performed at both of these sites and sent for cytology                         and cell block. the [id number] )[id number]) Olympus                         broncoscope was  introduced through the and advanced to                         the tracheobronchial tree. right tracheobronchial tree                         was normal. the left upper lobe take off was puckered                         and friable. I obtained LUL endobronchial biopsy,                         brushing, and BAL. The patient tolerated the procedure                         well. Scope In: Scope Out: Findings:      The endotracheal tube is in good position. The visualized portion of the       trachea is of normal caliber. The carina is sharp. The tracheobronchial       tree was examined to at least the first subsegmental level. right       tracheobronchial tree was normal. the left upper lobe take off was       puckered and friable. Impression:            - Unresolving left upper lobe infiltrate                        - Hilar lymphadenopathy of the left side                        - erythematous and friable LUL takeoff                        - left hilar adenopathy - EBUS TBNA performed of 4L                         and 10L Moderate Sedation:      general anesthesia Recommendation:        - Await BAL, biopsy, brushing, culture and cytology                         results. Procedure Code(s):     --- Professional ---                        [id number], Bronchoscopy, rigid or flexible, including                         fluoroscopic guidance, when performed; diagnostic,  with cell washing, when performed (separate procedure) Diagnosis Code(s):     --- Professional ---                        R91.8, Other nonspecific abnormal finding of lung field                        R59.0, Localized enlarged lymph nodes CPT copyright 2019 American Medical Association. All rights reserved. The codes documented in this report are preliminary and upon coder review may  be revised to meet current compliance requirements. Seba Madole S. Shearon Stalls MD, MD 06/15/2022 1:23:49 PM This report has been signed  electronically. Number of Addenda: 0

## 2022-06-15 NOTE — Anesthesia Postprocedure Evaluation (Signed)
Anesthesia Post Note  Patient: Lisa Acosta  Procedure(s) Performed: VIDEO BRONCHOSCOPY WITH FLUORO, BRONCHOSCOPY WITH BAL, TRANSBRONCHIAL BIOPIES, POSSIBLE VIDEO BRONCHOSCOPY WITH ENDOBRONCHIAL ULTRASOUND BRONCHIAL BIOPSIES BRONCHIAL NEEDLE ASPIRATION BIOPSIES BRONCHIAL WASHINGS     Patient location during evaluation: PACU Anesthesia Type: General Level of consciousness: awake and alert Pain management: pain level controlled Vital Signs Assessment: post-procedure vital signs reviewed and stable Respiratory status: spontaneous breathing, nonlabored ventilation and respiratory function stable Cardiovascular status: blood pressure returned to baseline and stable Postop Assessment: no apparent nausea or vomiting Anesthetic complications: no   No notable events documented.  Last Vitals:  Vitals:   06/15/22 1330 06/15/22 1345  BP: (!) 118/57 (!) 119/58  Pulse: (!) 58 61  Resp: 20 17  Temp:  37.1 C  SpO2: 94% 93%    Last Pain:  Vitals:   06/15/22 1345  TempSrc:   PainSc: 0-No pain                 Jessicalynn Deshong,W. EDMOND

## 2022-06-17 LAB — CULTURE, RESPIRATORY W GRAM STAIN
Culture: NO GROWTH
Gram Stain: NONE SEEN

## 2022-06-18 ENCOUNTER — Encounter (HOSPITAL_COMMUNITY): Payer: Self-pay | Admitting: Internal Medicine

## 2022-06-19 ENCOUNTER — Telehealth: Payer: Self-pay | Admitting: Internal Medicine

## 2022-06-19 NOTE — Telephone Encounter (Signed)
Pt has an upcoming appt scheduled with Dr. Shearon Stalls 10/19. Dr. Shearon Stalls, please advise if you have any info for pt from recent bronch.

## 2022-06-20 ENCOUNTER — Telehealth: Payer: Self-pay | Admitting: Internal Medicine

## 2022-06-20 ENCOUNTER — Ambulatory Visit: Payer: Self-pay | Admitting: Nurse Practitioner

## 2022-06-20 DIAGNOSIS — F321 Major depressive disorder, single episode, moderate: Secondary | ICD-10-CM | POA: Diagnosis not present

## 2022-06-20 LAB — CYTOLOGY - NON PAP

## 2022-06-20 MED ORDER — HYDROCODONE BIT-HOMATROP MBR 5-1.5 MG/5ML PO SOLN: 5.0000 mL | Freq: Four times a day (QID) | ORAL | 0 refills | Status: DC | PRN

## 2022-06-20 NOTE — Telephone Encounter (Signed)
Called and spoke with patient. I relayed to her Dr. Mauricio Po message. She verbalized understanding.   Nothing further needed at time of call.

## 2022-06-20 NOTE — Telephone Encounter (Signed)
Reviewed results with patient. Plan to discuss in tumor board this week. She has follow up with me next week for next steps - repeat imaging vs repeat endoscopy depending on their recommendation. Cough syrup called into pharmacy for her ongoing cough.

## 2022-06-21 LAB — SURGICAL PATHOLOGY

## 2022-06-22 ENCOUNTER — Other Ambulatory Visit: Payer: Self-pay | Admitting: *Deleted

## 2022-06-22 ENCOUNTER — Telehealth: Payer: Self-pay | Admitting: Internal Medicine

## 2022-06-22 DIAGNOSIS — J181 Lobar pneumonia, unspecified organism: Secondary | ICD-10-CM

## 2022-06-22 MED ORDER — BENZONATATE 200 MG PO CAPS: 200.0000 mg | ORAL_CAPSULE | Freq: Three times a day (TID) | ORAL | 1 refills | Status: DC | PRN

## 2022-06-22 NOTE — Progress Notes (Signed)
The proposed treatment discussed in conference is for discussion purpose only and is not a binding recommendation.  The patients have not been physically examined, or presented with their treatment options.  Therefore, final treatment plans cannot be decided.  

## 2022-06-22 NOTE — Telephone Encounter (Signed)
Order placed for robotic bronch 10/19 and CT Super D Chest to be done ASAP before 10/19

## 2022-06-22 NOTE — Telephone Encounter (Signed)
Soonest I could do this would be 10/30. NM or BI could you arrange to do sooner?

## 2022-06-22 NOTE — Telephone Encounter (Signed)
Called patient to review results of tumor board conference. Recommendation was for repeat bronchoscopy. I will set her up with one of my colleagues who does navigational bronchoscopy to see how quickly we can get her on the schedule. She has an appointment with me next week for follow up that needs to be moved to a week or two after her bronch for follow up.  I sent in [place] for her ongoing cough since hydromet cough syrup was not covered.

## 2022-06-23 ENCOUNTER — Encounter: Payer: Self-pay | Admitting: Student

## 2022-06-23 ENCOUNTER — Other Ambulatory Visit: Payer: Self-pay | Admitting: Student

## 2022-06-23 DIAGNOSIS — Z01812 Encounter for preprocedural laboratory examination: Secondary | ICD-10-CM

## 2022-06-23 NOTE — Telephone Encounter (Signed)
Bronch has been scheduled for 10/19 with Dr Verlee Monte.  I rescheduled follow up appt with Dr Shearon Stalls for 11/1.  I spoke to pt & gave her appt info.

## 2022-06-26 ENCOUNTER — Ambulatory Visit (HOSPITAL_COMMUNITY)
Admission: RE | Admit: 2022-06-26 | Discharge: 2022-06-26 | Disposition: A | Discharge status: Home / Self Care | Payer: Medicare Other | Source: Ambulatory Visit | Attending: Student | Admitting: Student

## 2022-06-26 ENCOUNTER — Other Ambulatory Visit: Payer: Medicare Other

## 2022-06-26 DIAGNOSIS — Z01812 Encounter for preprocedural laboratory examination: Secondary | ICD-10-CM | POA: Diagnosis not present

## 2022-06-26 DIAGNOSIS — J181 Lobar pneumonia, unspecified organism: Secondary | ICD-10-CM | POA: Diagnosis not present

## 2022-06-27 ENCOUNTER — Other Ambulatory Visit: Payer: Self-pay

## 2022-06-27 ENCOUNTER — Encounter (HOSPITAL_COMMUNITY): Payer: Self-pay | Admitting: Student

## 2022-06-27 NOTE — Progress Notes (Signed)
PCP - Silvestre Mesi, MD Cardiologist - Kirk Ruths, MD  PPM/ICD - denies  Chest x-ray - n/a EKG - 04/10/22 Stress Test - 06/27/17 ECHO - 11/12/15 Cardiac Cath - denies  CPAP - positive for OSA but patient is not wearing it; she uses oxygen at bedtime  Fasting Blood Sugar - n/a  Blood Thinner Instructions: Xarelto - last dose - 06/26/22 per MD order Aspirin Instructions: Patient was instructed: As of today, STOP taking any Aspirin (unless otherwise instructed by your surgeon) Aleve, Naproxen, Ibuprofen, Motrin, Advil, Goody's, BC's, all herbal medications, fish oil, and all vitamins.  ERAS Protcol - n/a  COVID TEST- done on 06/26/22 (pending)  Anesthesia review: no  Patient verbally denies any shortness of breath, fever, cough and chest pain during phone call   -------------  SDW INSTRUCTIONS given:  Your procedure is scheduled on Thursday, October 19th, 2023.  Report to [facility] Main Entrance "A" at 08:00 A.M., and check in at the Admitting office.  Call this number if you have problems the morning of surgery:  [phone]   Remember:  Do not eat or drink after midnight the night before your surgery   Take these medicines the morning of surgery with A SIP OF WATER Amlodipine, Flecainide, Prozac, Synthroid, Prilosec, Crestor PRN: Tessalon, Hydrocodone bit, Zofran; eye drops   The day of surgery:                     Do not wear jewelry, make up, or nail polish            Do not wear lotions, powders, perfumes, or deodorant.            Do not shave 48 hours prior to surgery.              Do not bring valuables to the hospital.            [facility] is not responsible for any belongings or valuables.  Do NOT Smoke (Tobacco/Vaping) 24 hours prior to your procedure If you use a CPAP at night, you may bring all equipment for your overnight stay.   Contacts, glasses, dentures or bridgework may not be worn into surgery.      For patients admitted to the  hospital, discharge time will be determined by your treatment team.   Patients discharged the day of surgery will not be allowed to drive home, and someone needs to stay with them for 24 hours.    Special instructions:   [place]- Preparing For Surgery  Before surgery, you can play an important role. Because skin is not sterile, your skin needs to be as free of germs as possible. You can reduce the number of germs on your skin by washing with CHG (chlorahexidine gluconate) Soap before surgery.  CHG is an antiseptic cleaner which kills germs and bonds with the skin to continue killing germs even after washing.    Oral Hygiene is also important to reduce your risk of infection.  Remember - BRUSH YOUR TEETH THE MORNING OF SURGERY WITH YOUR REGULAR TOOTHPASTE  Please do not use if you have an allergy to CHG or antibacterial soaps. If your skin becomes reddened/irritated stop using the CHG.  Do not shave (including legs and underarms) for at least 48 hours prior to first CHG shower. It is OK to shave your face.  Please follow these instructions carefully.   Shower the NIGHT BEFORE SURGERY and the MORNING OF SURGERY with DIAL  Soap.   Pat yourself dry with a CLEAN TOWEL.  Wear CLEAN PAJAMAS to bed the night before surgery  Place CLEAN SHEETS on your bed the night of your first shower and DO NOT SLEEP WITH PETS.   Day of Surgery: Please shower morning of surgery  Wear Clean/Comfortable clothing the morning of surgery Do not apply any deodorants/lotions.   Remember to brush your teeth WITH YOUR REGULAR TOOTHPASTE.   Questions were answered. Patient verbalized understanding of instructions.

## 2022-06-28 LAB — NOVEL CORONAVIRUS, NAA: SARS-CoV-2, NAA: NOT DETECTED

## 2022-06-28 LAB — SPECIMEN STATUS REPORT

## 2022-06-28 NOTE — Progress Notes (Signed)
Patient was called and informed that the surgery time for tomorrow was changed from 10:30 to 11:30 o'clock. Patient was instructed to be at the hospital at 09:00 o'clock. Patient will be NPO after midnight. Patient verbalized understanding.

## 2022-06-29 ENCOUNTER — Ambulatory Visit (HOSPITAL_COMMUNITY): Payer: Medicare Other | Admitting: Anesthesiology

## 2022-06-29 ENCOUNTER — Ambulatory Visit: Payer: Medicare Other | Admitting: Internal Medicine

## 2022-06-29 ENCOUNTER — Ambulatory Visit (HOSPITAL_BASED_OUTPATIENT_CLINIC_OR_DEPARTMENT_OTHER): Payer: Medicare Other | Admitting: Anesthesiology

## 2022-06-29 ENCOUNTER — Ambulatory Visit (HOSPITAL_COMMUNITY): Payer: Medicare Other

## 2022-06-29 ENCOUNTER — Encounter (HOSPITAL_COMMUNITY): Payer: Self-pay | Admitting: Pulmonary Disease

## 2022-06-29 ENCOUNTER — Ambulatory Visit (HOSPITAL_COMMUNITY)
Admission: RE | Admit: 2022-06-29 | Discharge: 2022-06-29 | Disposition: A | Discharge status: Home / Self Care | Payer: Medicare Other | Attending: Pulmonary Disease | Admitting: Pulmonary Disease

## 2022-06-29 ENCOUNTER — Other Ambulatory Visit: Payer: Self-pay

## 2022-06-29 ENCOUNTER — Encounter (HOSPITAL_COMMUNITY)
Admission: RE | Disposition: A | Discharge status: Home / Self Care | Payer: Self-pay | Source: Home / Self Care | Attending: Pulmonary Disease

## 2022-06-29 DIAGNOSIS — Z9181 History of falling: Secondary | ICD-10-CM | POA: Insufficient documentation

## 2022-06-29 DIAGNOSIS — R5383 Other fatigue: Secondary | ICD-10-CM | POA: Insufficient documentation

## 2022-06-29 DIAGNOSIS — R0902 Hypoxemia: Secondary | ICD-10-CM | POA: Diagnosis not present

## 2022-06-29 DIAGNOSIS — J8410 Pulmonary fibrosis, unspecified: Secondary | ICD-10-CM | POA: Diagnosis not present

## 2022-06-29 DIAGNOSIS — J40 Bronchitis, not specified as acute or chronic: Secondary | ICD-10-CM | POA: Diagnosis not present

## 2022-06-29 DIAGNOSIS — R590 Localized enlarged lymph nodes: Secondary | ICD-10-CM | POA: Insufficient documentation

## 2022-06-29 DIAGNOSIS — G4730 Sleep apnea, unspecified: Secondary | ICD-10-CM

## 2022-06-29 DIAGNOSIS — R351 Nocturia: Secondary | ICD-10-CM | POA: Insufficient documentation

## 2022-06-29 DIAGNOSIS — J9809 Other diseases of bronchus, not elsewhere classified: Secondary | ICD-10-CM | POA: Diagnosis not present

## 2022-06-29 DIAGNOSIS — M1990 Unspecified osteoarthritis, unspecified site: Secondary | ICD-10-CM | POA: Diagnosis not present

## 2022-06-29 DIAGNOSIS — J984 Other disorders of lung: Secondary | ICD-10-CM | POA: Diagnosis not present

## 2022-06-29 DIAGNOSIS — R053 Chronic cough: Secondary | ICD-10-CM | POA: Insufficient documentation

## 2022-06-29 DIAGNOSIS — I10 Essential (primary) hypertension: Secondary | ICD-10-CM | POA: Insufficient documentation

## 2022-06-29 DIAGNOSIS — Z8711 Personal history of peptic ulcer disease: Secondary | ICD-10-CM | POA: Insufficient documentation

## 2022-06-29 DIAGNOSIS — I4891 Unspecified atrial fibrillation: Secondary | ICD-10-CM | POA: Insufficient documentation

## 2022-06-29 DIAGNOSIS — Z9889 Other specified postprocedural states: Secondary | ICD-10-CM | POA: Diagnosis not present

## 2022-06-29 DIAGNOSIS — R918 Other nonspecific abnormal finding of lung field: Secondary | ICD-10-CM

## 2022-06-29 HISTORY — DX: Cardiac arrhythmia, unspecified: I49.9

## 2022-06-29 HISTORY — DX: Unspecified osteoarthritis, unspecified site: M19.90

## 2022-06-29 HISTORY — PX: BRONCHIAL NEEDLE ASPIRATION BIOPSY: SHX5106

## 2022-06-29 HISTORY — PX: VIDEO BRONCHOSCOPY WITH ENDOBRONCHIAL ULTRASOUND: SHX6177

## 2022-06-29 HISTORY — PX: BRONCHIAL BIOPSY: SHX5109

## 2022-06-29 HISTORY — DX: Pneumonia, unspecified organism: J18.9

## 2022-06-29 SURGERY — BRONCHOSCOPY, WITH BIOPSY USING ELECTROMAGNETIC NAVIGATION
Anesthesia: General

## 2022-06-29 MED ORDER — LIDOCAINE 2% (20 MG/ML) 5 ML SYRINGE
INTRAMUSCULAR | Status: DC | PRN
  Administered 2022-06-29: 60 mg via INTRAVENOUS

## 2022-06-29 MED ORDER — OXYCODONE HCL 5 MG PO TABS: 5.0000 mg | ORAL_TABLET | Freq: Once | ORAL | Status: DC | PRN

## 2022-06-29 MED ORDER — EPHEDRINE SULFATE (PRESSORS) 50 MG/ML IJ SOLN
INTRAMUSCULAR | Status: DC | PRN
  Administered 2022-06-29 (×2): 5 mg via INTRAVENOUS

## 2022-06-29 MED ORDER — ONDANSETRON HCL 4 MG/2ML IJ SOLN
INTRAMUSCULAR | Status: DC | PRN
  Administered 2022-06-29: 4 mg via INTRAVENOUS

## 2022-06-29 MED ORDER — PHENYLEPHRINE 80 MCG/ML (10ML) SYRINGE FOR IV PUSH (FOR BLOOD PRESSURE SUPPORT)
PREFILLED_SYRINGE | INTRAVENOUS | Status: DC | PRN
  Administered 2022-06-29: 80 ug via INTRAVENOUS
  Administered 2022-06-29: 40 ug via INTRAVENOUS

## 2022-06-29 MED ORDER — LACTATED RINGERS IV SOLN: INTRAVENOUS | Status: DC

## 2022-06-29 MED ORDER — FENTANYL CITRATE (PF) 250 MCG/5ML IJ SOLN
INTRAMUSCULAR | Status: DC | PRN
  Administered 2022-06-29: 100 ug via INTRAVENOUS

## 2022-06-29 MED ORDER — CHLORHEXIDINE GLUCONATE 0.12 % MT SOLN
15.0000 mL | Freq: Once | OROMUCOSAL | Status: AC
  Administered 2022-06-29: 15 mL via OROMUCOSAL
  Filled 2022-06-29: qty 15

## 2022-06-29 MED ORDER — PROMETHAZINE HCL 25 MG/ML IJ SOLN: 6.2500 mg | INTRAMUSCULAR | Status: DC | PRN

## 2022-06-29 MED ORDER — HYDROMORPHONE HCL 1 MG/ML IJ SOLN: 0.2500 mg | INTRAMUSCULAR | Status: DC | PRN

## 2022-06-29 MED ORDER — SUGAMMADEX SODIUM 200 MG/2ML IV SOLN
INTRAVENOUS | Status: DC | PRN
  Administered 2022-06-29: 150 mg via INTRAVENOUS

## 2022-06-29 MED ORDER — DEXAMETHASONE SODIUM PHOSPHATE 10 MG/ML IJ SOLN
INTRAMUSCULAR | Status: DC | PRN
  Administered 2022-06-29: 10 mg via INTRAVENOUS

## 2022-06-29 MED ORDER — ROCURONIUM BROMIDE 10 MG/ML (PF) SYRINGE
PREFILLED_SYRINGE | INTRAVENOUS | Status: DC | PRN
  Administered 2022-06-29: 60 mg via INTRAVENOUS

## 2022-06-29 MED ORDER — PROPOFOL 10 MG/ML IV BOLUS
INTRAVENOUS | Status: DC | PRN
  Administered 2022-06-29: 150 mg via INTRAVENOUS

## 2022-06-29 MED ORDER — AMISULPRIDE (ANTIEMETIC) 5 MG/2ML IV SOLN: 10.0000 mg | Freq: Once | INTRAVENOUS | Status: DC | PRN

## 2022-06-29 MED ORDER — OXYCODONE HCL 5 MG/5ML PO SOLN: 5.0000 mg | Freq: Once | ORAL | Status: DC | PRN

## 2022-06-29 SURGICAL SUPPLY — 30 items
BRUSH CYTOL CELLEBRITY 1.5X140 (MISCELLANEOUS) IMPLANT
CANISTER SUCT 3000ML PPV (MISCELLANEOUS) ×2 IMPLANT
CONT SPEC 4OZ CLIKSEAL STRL BL (MISCELLANEOUS) ×2 IMPLANT
COVER BACK TABLE 60X90IN (DRAPES) ×2 IMPLANT
COVER DOME SNAP 22 D (MISCELLANEOUS) ×2 IMPLANT
FORCEPS BIOP RJ4 1.8 (CUTTING FORCEPS) IMPLANT
GAUZE SPONGE 4X4 12PLY STRL (GAUZE/BANDAGES/DRESSINGS) ×2 IMPLANT
GLOVE BIO SURGEON STRL SZ7.5 (GLOVE) ×2 IMPLANT
GOWN STRL REUS W/ TWL LRG LVL3 (GOWN DISPOSABLE) ×2 IMPLANT
GOWN STRL REUS W/TWL LRG LVL3 (GOWN DISPOSABLE) ×2
KIT CLEAN ENDO COMPLIANCE (KITS) ×4 IMPLANT
KIT TURNOVER KIT B (KITS) ×2 IMPLANT
MARKER SKIN DUAL TIP RULER LAB (MISCELLANEOUS) ×2 IMPLANT
NDL EBUS SONO TIP PENTAX (NEEDLE) ×2 IMPLANT
NEEDLE EBUS SONO TIP PENTAX (NEEDLE) ×2 IMPLANT
NS IRRIG 1000ML POUR BTL (IV SOLUTION) ×2 IMPLANT
OIL SILICONE PENTAX (PARTS (SERVICE/REPAIRS)) ×2 IMPLANT
PAD ARMBOARD 7.5X6 YLW CONV (MISCELLANEOUS) ×4 IMPLANT
SOL ANTI FOG 6CC (MISCELLANEOUS) ×2 IMPLANT
SOLUTION ANTI FOG 6CC (MISCELLANEOUS) ×2
SYR 20CC LL (SYRINGE) ×4 IMPLANT
SYR 20ML ECCENTRIC (SYRINGE) ×4 IMPLANT
SYR 50ML SLIP (SYRINGE) IMPLANT
SYR 5ML LUER SLIP (SYRINGE) ×2 IMPLANT
TOWEL OR 17X24 6PK STRL BLUE (TOWEL DISPOSABLE) ×2 IMPLANT
TRAP SPECIMEN MUCOUS 40CC (MISCELLANEOUS) IMPLANT
TUBE CONNECTING 20X1/4 (TUBING) ×4 IMPLANT
UNDERPAD 30X30 (UNDERPADS AND DIAPERS) ×2 IMPLANT
VALVE DISPOSABLE (MISCELLANEOUS) ×2 IMPLANT
WATER STERILE IRR 1000ML POUR (IV SOLUTION) ×2 IMPLANT

## 2022-06-29 NOTE — H&P (Signed)
Lisa Acosta                                                [id number]                                          October 17, 1950   Primary Care Physician:Copland, Gay Filler, MD   Referring Physician: Darreld Mclean, [address] [place],  [place] [address] Reason for Consultation: abnormal CT Chest Date of Consultation: 05/29/2022   Chief complaint:       Chief Complaint  Patient presents with   Consult      Pneumonia in July 2023, cough left sided pain       HPI:  Per Dr. Shearon Stalls:   "Lisa Acosta is a 71 y.o. woman who presents for new patient evaluation of left chest pain and an abnormal CT Chest.    She was diagnosed with left upper lobe pneumonia in July 2023. She was evaluated in the ED and found to have LUL consolidation consistent with pneumonia and was treated with a course of abx. She had repeat imaging 6 weeks later which shows persistent LUL consolidation despite two outpatient rounds of abx.    Biggest symptom is persistent pain in the left side, with cough and scant mucus usually white. She had a low grade fever at initial diagnosis in July when she was in the ED 99.1 F. She denies dyspnea. Has difficulty swallowing. No wheezing.   She reports having a light cough occasionally but this persistent cough has been going on since July. She wakes up due to nocturia. She had a sleep study and no longer has sleep apnea after intentional weight loss but does have nocturnal hypoxemia.    She notes worsening fatigue, decreased activity level for the past two months. She would like to get back to former activity level. She has a history of falls and ambulates with walker.    She has had bronchitis in the past but no recurrent pna and denies childhood respiratory disease or hospitalization for respiratory illness. No previous history of cancer or  significant family history. UTD on age appropriate cancer screening - due for mammo soon."   06/29/2022: here today for repeat bronchoscopy    Social history:   Occupation: retired, used to work as Web designer  Exposures: lives at home with husband - daughter, Son in Sports coach Smoking history: never smoker, had passive smoke exposure in spouse and parents.    Social History         Occupational History   Occupation:  Retired      Fish farm manager: OTHER      Comment: [employer]  Tobacco Use   Smoking status: Never   Smokeless tobacco: Never  Vaping Use   Vaping Use: Never used  Substance and Sexual Activity   Alcohol use: Yes      Comment: rare   Drug use: No   Sexual activity: Not on file      Relevant family history:        Family History  Problem Relation Age of Onset   Atrial fibrillation Mother     Congestive Heart Failure Mother     Depression Mother     Heart disease Mother     Stroke Mother     Thyroid disease Mother     Anxiety disorder Mother     Obesity Mother     Heart disease Father     Alcohol abuse Father     High blood pressure Father     High Cholesterol Father     Alcoholism Father     Depression Sister     Heart disease Brother     Heart disease Brother     Depression Daughter            Past Medical History:  Diagnosis Date   Atrial fibrillation ([place])     Back pain     Constipation     Depression     Diverticulitis     Diverticulitis     Diverticulosis     Esophageal ulcer     Fatty liver     Gait abnormality 10/14/2018   GERD (gastroesophageal reflux disease)     Hyperlipidemia     Hypertension     Hypothyroid     Memory difficulty 08/18/2019   OSA (obstructive sleep apnea) 08/31/2016   Osteopenia     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   Pre-diabetes             Past Surgical History:  Procedure Laterality Date   BREAST BIOPSY Left      needle core biopsy, benign   CATARACT EXTRACTION       HAND  SURGERY       KNEE SURGERY       ROTATOR CUFF REPAIR       TOTAL ABDOMINAL HYSTERECTOMY            Physical Exam: BP (!) 125/58   Pulse (!) 53   Temp 98.7 F (37.1 C) (Oral)   Resp 18   Ht 5' 0.1" (1.527 m)   Wt 70.3 kg   SpO2 93%   BMI 30.17 kg/m   General appearance: 71 y.o., female, female, NAD, conversant  Eyes: anicteric sclerae, moist conjunctivae; no lid-lag; PERRLA, tracking appropriately HENT: NCAT; oropharynx, MMM, no mucosal ulcerations; normal hard and soft palate Neck: Trachea midline; FROM, supple, lymphadenopathy, no JVD Lungs: CTAB, no crackles, no wheeze CV: RRR, S1, S2, no MRGs  Abdomen: Soft, non-tender; non-distended, BS present  Extremities: No peripheral edema, radial and DP pulses present bilaterally  Skin: Normal temperature, turgor and texture; no rash Psych: Appropriate affect Neuro: Alert and oriented to person and place, no focal deficit       Data Reviewed/Medical Decision Making:   Independent interpretation of tests: Imaging:  Review of patient's CT Chest images revealed persistent LUL consolidation from July to September with reactive lymphadenopathy. The patient's images have been independently reviewed by me.     PFTs: I have personally reviewed the patient's  PFTs and normal pft 2017     Latest Ref Rng & Units 08/29/2016    4:08 PM  PFT Results  FVC-Pre L 2.57   FVC-Predicted Pre % 93   FVC-Post L 2.63   FVC-Predicted Post % 95   Pre FEV1/FVC % % 83   Post FEV1/FCV % % 84   FEV1-Pre L 2.13   FEV1-Predicted Pre % 101   FEV1-Post L 2.21   DLCO uncorrected ml/min/mmHg 15.17   DLCO UNC% % 75   DLVA Predicted % 86   TLC L 4.20   TLC % Predicted % 91   RV % Predicted % 73       Labs:  Recent Labs       Lab Results  Component Value Date    WBC 7.8 04/06/2022    HGB 13.6 04/06/2022    HCT 40.2 04/06/2022    MCV 88.0 04/06/2022    PLT 231 04/06/2022      Recent Labs       Lab Results  Component Value Date    NA 138  04/06/2022    K 3.9 04/06/2022    CL 107 04/06/2022    CO2 24 04/06/2022          Immunization status:      Immunization History  Administered Date(s) Administered   Fluad Quad(high Dose 65+) 05/02/2019, 06/23/2020, 06/30/2021   Influenza, High Dose Seasonal PF 06/14/2016, 05/15/2018   Influenza,inj,Quad PF,6+ Mos 06/11/2014   Influenza,inj,quad, With Preservative 06/11/2017   PFIZER(Purple Top)SARS-COV-2 Vaccination 10/03/2019, 10/24/2019, 06/17/2020, 03/16/2021   Pfizer Covid-19 Vaccine Bivalent Booster 49yr & up 08/02/2021   Pneumococcal Conjugate-13 08/07/2016   Pneumococcal Polysaccharide-23 01/28/2018   Tdap 01/28/2018       I reviewed prior external note(s) from ED, PCP  I reviewed the result(s) of the labs and imaging as noted above.   I have ordered PET scan   Discussion of management or test interpretation with another colleague.   Assessment:  Abnormal ct  Left upper lobe infiltrate Adenopathy    Plan/Recommendations:  Bronchoscopy planned today  Robotic assisted bronchoscopy today  Discussed risks benefits  Patient is agreeable    BGarner Nash DO [place] [facility] 06/29/2022 12:01 PM

## 2022-06-29 NOTE — Anesthesia Postprocedure Evaluation (Signed)
Anesthesia Post Note  Patient: Lisa Acosta  Procedure(s) Performed: ROBOTIC ASSISTED NAVIGATIONAL BRONCHOSCOPY VIDEO BRONCHOSCOPY WITH ENDOBRONCHIAL ULTRASOUND BRONCHIAL BIOPSIES BRONCHIAL NEEDLE ASPIRATION BIOPSIES     Patient location during evaluation: PACU Anesthesia Type: General Level of consciousness: awake and alert Pain management: pain level controlled Vital Signs Assessment: post-procedure vital signs reviewed and stable Respiratory status: spontaneous breathing, nonlabored ventilation and respiratory function stable Cardiovascular status: blood pressure returned to baseline and stable Postop Assessment: no apparent nausea or vomiting Anesthetic complications: no   No notable events documented.  Last Vitals:  Vitals:   06/29/22 1400 06/29/22 1415  BP: (!) 115/98 (!) 103/57  Pulse: (!) 55 (!) 59  Resp: 18 17  Temp:  37.2 C  SpO2: 92% 97%    Last Pain:  Vitals:   06/29/22 1415  TempSrc:   PainSc: 2                  Lynda Rainwater

## 2022-06-29 NOTE — Interval H&P Note (Signed)
History and Physical Interval Note:  06/29/2022 12:05 PM  Lisa Acosta  has presented today for surgery, with the diagnosis of left lung mass.  The various methods of treatment have been discussed with the patient and family. After consideration of risks, benefits and other options for treatment, the patient has consented to  Procedure(s): ROBOTIC ASSISTED NAVIGATIONAL BRONCHOSCOPY (N/A) VIDEO BRONCHOSCOPY WITH ENDOBRONCHIAL ULTRASOUND (N/A) as a surgical intervention.  The patient's history has been reviewed, patient examined, no change in status, stable for surgery.  I have reviewed the patient's chart and labs.  Questions were answered to the patient's satisfaction.     [name]

## 2022-06-29 NOTE — Transfer of Care (Signed)
Immediate Anesthesia Transfer of Care Note  Patient: Lisa Acosta  Procedure(s) Performed: ROBOTIC ASSISTED NAVIGATIONAL BRONCHOSCOPY VIDEO BRONCHOSCOPY WITH ENDOBRONCHIAL ULTRASOUND BRONCHIAL BIOPSIES BRONCHIAL NEEDLE ASPIRATION BIOPSIES  Patient Location: PACU  Anesthesia Type:General  Level of Consciousness: drowsy and patient cooperative  Airway & Oxygen Therapy: Patient Spontanous Breathing and Patient connected to nasal cannula oxygen  Post-op Assessment: Report given to RN, Post -op Vital signs reviewed and stable and Patient moving all extremities X 4  Post vital signs: Reviewed and stable  Last Vitals:  Vitals Value Taken Time  BP 147/118 06/29/22 1333  Temp    Pulse 61 06/29/22 1333  Resp 11 06/29/22 1333  SpO2 93 % 06/29/22 1333  Vitals shown include unvalidated device data.  Last Pain:  Vitals:   06/29/22 0952  TempSrc:   PainSc: 0-No pain         Complications: No notable events documented.

## 2022-06-29 NOTE — Discharge Instructions (Signed)
Flexible Bronchoscopy, Care After This sheet gives you information about how to care for yourself after your test. Your doctor may also give you more specific instructions. If you have problems or questions, contact your doctor. Follow these instructions at home: Eating and drinking Do not eat or drink anything (not even water) for 2 hours after your test, or until your numbing medicine (local anesthetic) wears off. When your numbness is gone and your cough and gag reflexes have come back, you may: Eat only soft foods. Slowly drink liquids. The day after the test, go back to your normal diet. Driving Do not drive for 24 hours if you were given a medicine to help you relax (sedative). Do not drive or use heavy machinery while taking prescription pain medicine. General instructions  Take over-the-counter and prescription medicines only as told by your doctor. Return to your normal activities as told. Ask what activities are safe for you. Do not use any products that have nicotine or tobacco in them. This includes cigarettes and e-cigarettes. If you need help quitting, ask your doctor. Keep all follow-up visits as told by your doctor. This is important. It is very important if you had a tissue sample (biopsy) taken. Get help right away if: You have shortness of breath that gets worse. You get light-headed. You feel like you are going to pass out (faint). You have chest pain. You cough up: More than a little blood. More blood than before. Summary Do not eat or drink anything (not even water) for 2 hours after your test, or until your numbing medicine wears off. Do not use cigarettes. Do not use e-cigarettes. Get help right away if you have chest pain.  This information is not intended to replace advice given to you by your health care provider. Make sure you discuss any questions you have with your health care provider. Document Released: 06/25/2009 Document Revised: 08/10/2017 Document  Reviewed: 09/15/2016 Elsevier Patient Education  2020 [employer].

## 2022-06-29 NOTE — Op Note (Signed)
Video Bronchoscopy with Robotic Assisted Bronchoscopic Navigation   Date of Operation: 06/29/2022   Pre-op Diagnosis: Left upper lobe infiltrate, mediastinal adenopathy  Post-op Diagnosis: Left upper lobe infiltrate, endobronchial polyp lesions  Surgeon: Garner Nash, DO   Assistants: None   Anesthesia: General endotracheal anesthesia  Operation: Flexible video fiberoptic bronchoscopy with robotic assistance and biopsies.  Estimated Blood Loss: Minimal  Complications: None  Indications and History: Lisa Acosta is a 71 y.o. female with history of left upper lobe infiltrate, mediastinal adenopathy. The risks, benefits, complications, treatment options and expected outcomes were discussed with the patient.  The possibilities of pneumothorax, pneumonia, reaction to medication, pulmonary aspiration, perforation of a viscus, bleeding, failure to diagnose a condition and creating a complication requiring transfusion or operation were discussed with the patient who freely signed the consent.    Description of Procedure: The patient was seen in the Preoperative Area, was examined and was deemed appropriate to proceed.  The patient was taken to [facility] endoscopy room 3, identified as Lisa Acosta and the procedure verified as Flexible Video Fiberoptic Bronchoscopy.  A Time Out was held and the above information confirmed.   Prior to the date of the procedure a high-resolution CT scan of the chest was performed. Utilizing ION software program a virtual tracheobronchial tree was generated to allow the creation of distinct navigation pathways to the patient's parenchymal abnormalities. After being taken to the operating room general anesthesia was initiated and the patient  was orally intubated. The video fiberoptic bronchoscope was introduced via the endotracheal tube and a general inspection was performed which showed normal right and left lung anatomy, aspiration of the bilateral  mainstems was completed to remove any remaining secretions. Robotic catheter inserted into patient's endotracheal tube.   Target #1 left upper lobe infiltrate: The distinct navigation pathways prepared prior to this procedure were then utilized to navigate to patient's lesion identified on CT scan. The robotic catheter was secured into place and the vision probe was withdrawn.  Lesion location was approximated using fluoroscopy and three-dimensional cone beam CT imaging peripheral targeting. Under fluoroscopic guidance transbronchial needle brushings, transbronchial needle biopsies, and transbronchial forceps biopsies were performed to be sent for cytology and pathology.  Following completion of this portion of the procedure a standard therapeutic scope was inserted into the airways to make sure there was no significant bleeding.  We used the two-point a [employer] forceps to complete endobronchial biopsies of the polypoid lesion seen on the left upper lobe opening as well as within the distal subsegmental openings of the left upper lobe.  This tissue will be sent for surgical pathology evaluation.  A bronchioalveolar lavage was performed in the left upper lobe and sent for microbiology.  Target #2 station 10 L, 4L: The standard scope was then withdrawn and the endobronchial ultrasound was used to identify and characterize the peritracheal, hilar and bronchial lymph nodes. Inspection showed visible polypoid lesion along the opening of the left upper lobe.  Under ultrasound there was enlarged 4L peritracheal node as well as left hilar.  Using real-time ultrasound guidance Wang needle biopsies were take from Station 10L and 4L nodes and were sent for cytology. The patient tolerated the procedure well without apparent complications. There was no significant blood loss. The bronchoscope was withdrawn. Anesthesia was reversed and the patient was taken to the PACU for recovery.   At the end of the procedure  a general airway inspection was performed and there was no evidence of  active bleeding. The bronchoscope was removed.  The patient tolerated the procedure well. There was no significant blood loss and there were no obvious complications. A post-procedural chest x-ray is pending.  Samples Target #1: 1. Transbronchial needle brushings from LUL 2. Transbronchial Wang needle biopsies from LUL 3. Transbronchial forceps biopsies from LUL 5. Endobronchial biopsies from LUL opening   Samples Target #2: 1. Wang needle biopsies from 10L node 2.  Wang needle biopsies from 4L node  Plans:  The patient will be discharged from the PACU to home when recovered from anesthesia and after chest x-ray is reviewed. We will review the cytology, pathology and microbiology results with the patient when they become available. Outpatient followup will be with Garner Nash, DO.  Garner Nash, DO [facility] 06/29/2022 1:34 PM

## 2022-06-29 NOTE — Anesthesia Procedure Notes (Signed)
Procedure Name: Intubation Date/Time: 06/29/2022 12:19 PM  Performed by: Amadeo Garnet, CRNAPre-anesthesia Checklist: Patient identified, Emergency Drugs available, Suction available and Patient being monitored Patient Re-evaluated:Patient Re-evaluated prior to induction Oxygen Delivery Method: Circle system utilized Preoxygenation: Pre-oxygenation with 100% oxygen Induction Type: IV induction Ventilation: Mask ventilation without difficulty Laryngoscope Size: Mac and 4 Grade View: Grade I Tube type: Oral Tube size: 8.5 mm Number of attempts: 1 Airway Equipment and Method: Stylet and Oral airway Placement Confirmation: ETT inserted through vocal cords under direct vision, positive ETCO2 and breath sounds checked- equal and bilateral Secured at: 22 cm Tube secured with: Tape Dental Injury: Teeth and Oropharynx as per pre-operative assessment

## 2022-06-29 NOTE — Anesthesia Preprocedure Evaluation (Signed)
Anesthesia Evaluation  Patient identified by MRN, date of birth, ID band Patient awake    Reviewed: Allergy & Precautions, H&P , NPO status , Patient's Chart, lab work & pertinent test results, reviewed documented beta blocker date and time   History of Anesthesia Complications (+) PONV and history of anesthetic complications  Airway Mallampati: III  TM Distance: >3 FB Neck ROM: Full    Dental no notable dental hx. (+) Teeth Intact, Dental Advisory Given   Pulmonary shortness of breath, sleep apnea and Oxygen sleep apnea ,    Pulmonary exam normal breath sounds clear to auscultation       Cardiovascular hypertension, Pt. on medications and Pt. on home beta blockers + dysrhythmias Atrial Fibrillation  Rhythm:Regular Rate:Normal     Neuro/Psych Anxiety Depression negative neurological ROS     GI/Hepatic Neg liver ROS, PUD, GERD  Medicated,  Endo/Other  Hypothyroidism   Renal/GU negative Renal ROS  negative genitourinary   Musculoskeletal  (+) Arthritis , Osteoarthritis,    Abdominal (+) + obese,   Peds  Hematology negative hematology ROS (+)   Anesthesia Other Findings   Reproductive/Obstetrics negative OB ROS                             Anesthesia Physical  Anesthesia Plan  ASA: 3  Anesthesia Plan: General   Post-op Pain Management: Minimal or no pain anticipated   Induction: Intravenous  PONV Risk Score and Plan: 4 or greater and Ondansetron, Dexamethasone, Treatment may vary due to age or medical condition, Midazolam and Droperidol  Airway Management Planned: Oral ETT  Additional Equipment:   Intra-op Plan:   Post-operative Plan: Extubation in OR  Informed Consent: I have reviewed the patients History and Physical, chart, labs and discussed the procedure including the risks, benefits and alternatives for the proposed anesthesia with the patient or authorized representative  who has indicated his/her understanding and acceptance.     Dental advisory given  Plan Discussed with: CRNA  Anesthesia Plan Comments:         Anesthesia Quick Evaluation

## 2022-07-01 LAB — AEROBIC/ANAEROBIC CULTURE W GRAM STAIN (SURGICAL/DEEP WOUND): Culture: NORMAL

## 2022-07-01 LAB — CULTURE, BAL-QUANTITATIVE W GRAM STAIN: Culture: NO GROWTH

## 2022-07-01 LAB — CULTURE, RESPIRATORY W GRAM STAIN: Culture: NORMAL

## 2022-07-03 ENCOUNTER — Encounter (HOSPITAL_COMMUNITY): Payer: Self-pay | Admitting: Pulmonary Disease

## 2022-07-03 LAB — ACID FAST SMEAR (AFB, MYCOBACTERIA): Acid Fast Smear: NEGATIVE

## 2022-07-03 LAB — SURGICAL PATHOLOGY

## 2022-07-03 LAB — CYTOLOGY - NON PAP

## 2022-07-04 LAB — CYTOLOGY - NON PAP

## 2022-07-05 ENCOUNTER — Ambulatory Visit (INDEPENDENT_AMBULATORY_CARE_PROVIDER_SITE_OTHER): Payer: Medicare Other | Admitting: Pulmonary Disease

## 2022-07-05 ENCOUNTER — Encounter: Payer: Self-pay | Admitting: Family Medicine

## 2022-07-05 ENCOUNTER — Encounter: Payer: Self-pay | Admitting: Pulmonary Disease

## 2022-07-05 VITALS — BP 100/70 | HR 51 | Ht 61.0 in | Wt 159.0 lb

## 2022-07-05 DIAGNOSIS — R942 Abnormal results of pulmonary function studies: Secondary | ICD-10-CM | POA: Diagnosis not present

## 2022-07-05 DIAGNOSIS — D869 Sarcoidosis, unspecified: Secondary | ICD-10-CM | POA: Diagnosis not present

## 2022-07-05 DIAGNOSIS — F419 Anxiety disorder, unspecified: Secondary | ICD-10-CM

## 2022-07-05 MED ORDER — PREDNISONE 10 MG PO TABS: 30.0000 mg | ORAL_TABLET | Freq: Every day | ORAL | 1 refills | Status: AC

## 2022-07-05 NOTE — Patient Instructions (Signed)
Thank you for visiting Dr. Valeta Harms at [facility]. Today we recommend the following:  Orders Placed This Encounter  Procedures   CT Chest Wo Contrast   Meds ordered this encounter  Medications   predniSONE (DELTASONE) 10 MG tablet    Sig: Take 3 tablets (30 mg total) by mouth daily with breakfast.    Dispense:  180 tablet    Refill:  1   Return in about 8 weeks (around 08/30/2022) for with APP or Dr. Valeta Harms.    Please do your part to reduce the spread of COVID-19.

## 2022-07-05 NOTE — Progress Notes (Signed)
Synopsis: Referred in October 2023 for post bronchoscopy follow-up by Copland, Gay Filler, MD  Subjective:   PATIENT ID: Lisa Acosta GENDER: female DOB: [birth year], MRN: [id number]  Chief Complaint  Patient presents with   Follow-up    After bronch    This is a 71 year old female, past medical history of gastroesophageal reflux, hypertension, hyperlipidemia, atrial fibrillation patient is a lifelong never smoker.  She originally had an abnormal chest x-ray imaging axial CT imaging concern for persistent infiltrate and adenopathy concerning for underlying malignancy.  She had PET scan that was concerning.  She was taken for bronchoscopy with bronchoscopy endobronchial ultrasound which revealed granulomatous inflammation on pathology.  Due to the pattern seen on the PET scan there was still underlying concern for malignancy and the decision was made for repeat bronchoscopy and biopsy.  She was initially scheduled with Dr. Bjorn Loser however he was unable to complete the procedure and the patient had the procedure completed by me.  This also revealed granulomatous inflammation.  She additionally had small little lesions within the subsegments of the airway which showed and appeared as if endobronchial granuloma formation.  Biopsy of these also revealed granulomatous disease.  Her cultures are still pending but have no growth to date.  That is for AFB and fungus.    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   Pre-diabetes      Family History  Problem Relation Age of Onset   Atrial fibrillation Mother    Congestive Heart Failure Mother    Depression Mother    Heart disease Mother    Stroke Mother    Thyroid disease Mother    Anxiety disorder Mother    Obesity Mother    Heart disease Father    Alcohol abuse Father    High blood pressure Father    High Cholesterol Father    Alcoholism Father    Depression Sister    Heart disease Brother    Heart disease Brother    Depression Daughter      Past Surgical History:  Procedure Laterality Date   BREAST BIOPSY Left    needle core biopsy, benign   BRONCHIAL BIOPSY  06/15/2022   Procedure: BRONCHIAL BIOPSIES;  Surgeon: Spero Geralds, MD;  Location: [facility] ENDOSCOPY;  Service: Pulmonary;;   BRONCHIAL BIOPSY  06/29/2022   Procedure: BRONCHIAL BIOPSIES;  Surgeon: Garner Nash, DO;  Location: [facility] ENDOSCOPY;  Service: Pulmonary;;   BRONCHIAL NEEDLE ASPIRATION BIOPSY  06/15/2022   Procedure: BRONCHIAL NEEDLE ASPIRATION BIOPSIES;  Surgeon: Spero Geralds, MD;  Location[facility] ENDOSCOPY;  Service: Pulmonary;;   BRONCHIAL NEEDLE ASPIRATION BIOPSY  06/29/2022   Procedure: BRONCHIAL NEEDLE ASPIRATION BIOPSIES;  Surgeon: Garner Nash, DO;  Location: [facility] ENDOSCOPY;  Service: Pulmonary;;   BRONCHIAL WASHINGS  06/15/2022   Procedure: BRONCHIAL WASHINGS;  Surgeon: Shearon Stalls,  Senaida Ores, MD;  Location: [facility] ENDOSCOPY;  Service: Pulmonary;;   CATARACT EXTRACTION     DILATION AND CURETTAGE OF UTERUS     EYE SURGERY     HAND SURGERY     KNEE SURGERY     ROTATOR CUFF REPAIR     TOTAL ABDOMINAL HYSTERECTOMY     VIDEO BRONCHOSCOPY N/A 06/15/2022   Procedure: VIDEO BRONCHOSCOPY WITH FLUORO;  Surgeon: Spero Geralds, MD;  Location: [facility] ENDOSCOPY;  Service: Pulmonary;  Laterality: N/A;   VIDEO BRONCHOSCOPY WITH ENDOBRONCHIAL ULTRASOUND  06/15/2022   Procedure: VIDEO BRONCHOSCOPY WITH ENDOBRONCHIAL ULTRASOUND;  Surgeon:  Spero Geralds, MD;  Location[facility] ENDOSCOPY;  Service: Pulmonary;;   VIDEO BRONCHOSCOPY WITH ENDOBRONCHIAL ULTRASOUND N/A 06/29/2022   Procedure: VIDEO BRONCHOSCOPY WITH ENDOBRONCHIAL ULTRASOUND;  Surgeon: Garner Nash, DO;  Location: [place];  Service: Pulmonary;  Laterality: N/A;    Social History   Socioeconomic History   Marital status: Married    Spouse name: John   Number of children: 1   Years of education: [employer] education level: Not on file  Occupational History   Occupation: Retired    Fish farm manager: OTHER    Comment: [employer]  Tobacco Use   Smoking status: Never   Smokeless tobacco: Never  Vaping Use   Vaping Use: Never used  Substance and Sexual Activity   Alcohol use: Yes    Comment: rare   Drug use: No   Sexual activity: Not on file  Other Topics Concern   Not on file  Social History Narrative   Patient lives at home spouse.   Caffeine use: 2 sodas weekly   Right handed    Social Determinants of Health   Financial Resource Strain: Low Risk  (10/27/2021)   Overall Financial Resource Strain (CARDIA)    Difficulty of Paying Living Expenses: Not hard at all  Food Insecurity: No Food Insecurity (10/27/2021)   Hunger Vital Sign    Worried About Running Out of Food in the Last Year: Never true    Ran Out of Food in the Last Year: Never true  Transportation Needs: No Transportation Needs (10/27/2021)   PRAPARE - Hydrologist (Medical): No    Lack of Transportation (Non-Medical): No  Physical Activity: Inactive (09/26/2018)   Exercise Vital Sign    Days of Exercise per Week: 0 days    Minutes of Exercise per Session: 0 min  Stress: No Stress Concern Present (10/27/2021)   [employer]    Feeling of Stress : Only a little  Social Connections: Socially Integrated (10/27/2021)   Social Connection and Isolation Panel [NHANES]    Frequency of  Communication with Friends and Family: Once a week    Frequency of Social Gatherings with Friends and Family: More than three times a week    Attends Religious Services: More than 4 times per year    Active Member of [employer] or Organizations: Yes    Attends Archivist Meetings: More than 4 times per year    Marital Status: Married  Human resources officer Violence: Not At Risk (10/27/2021)   Humiliation, Afraid, Rape, and Kick questionnaire    Fear of Current or Ex-Partner: No    Emotionally Abused: No    Physically Abused: No    Sexually Abused: No     Allergies  Allergen Reactions   Dextran Anaphylaxis   Tree Extract  Penicillin G Rash   Penicillins Rash   Sulfa Antibiotics Rash     Outpatient Medications Prior to Visit  Medication Sig Dispense Refill   fluticasone (FLONASE) 50 MCG/ACT nasal spray SPRAY 2 SPRAYS INTO EACH NOSTRIL EVERY DAY 48 mL 1   alendronate (FOSAMAX) 70 MG tablet TAKE 1 TABLET BY MOUTH EVERY 7 DAYS, TAKE WITH A FULL GLASS OF WATER ON AN EMPTY STOMACH (Patient taking differently: Take 70 mg by mouth every Sunday.) 12 tablet 3   amLODipine (NORVASC) 5 MG tablet Take 1 tablet (5 mg total) by mouth daily. 90 tablet 3   atenolol (TENORMIN) 50 MG tablet Take 1 tablet (50 mg total) by mouth daily. (Patient taking differently: Take 50 mg by mouth at bedtime.) 90 tablet 3   benzonatate (TESSALON) 200 MG capsule Take 1 capsule (200 mg total) by mouth 3 (three) times daily as needed for cough. (Patient not taking: Reported on 07/05/2022) 30 capsule 1   Calcium Carb-Cholecalciferol (CALCIUM 600 + D PO) Take 1 tablet by mouth in the morning and at bedtime.     doxycycline (VIBRA-TABS) 100 MG tablet Take 1 tablet (100 mg total) by mouth 2 (two) times daily. (Patient not taking: Reported on 06/08/2022) 20 tablet 0   flecainide (TAMBOCOR) 100 MG tablet TAKE 1 TABLET BY MOUTH 2 TIMES DAILY 180 tablet 3   FLUoxetine (PROZAC) 40 MG capsule Take 1 capsule (40 mg total) by mouth 2  (two) times daily. 180 capsule 3   HYDROcodone bit-homatropine (HYCODAN) 5-1.5 MG/5ML syrup Take 5 mLs by mouth every 6 (six) hours as needed for cough. 240 mL 0   Krill Oil 500 MG CAPS Take 500 mg by mouth daily.     levothyroxine (SYNTHROID) 125 MCG tablet TAKE 1 TABLET BY MOUTH DAILY BEFORE BREAKFAST. 90 tablet 3   magnesium oxide (MAG-OX) 400 (240 Mg) MG tablet Take 400 mg by mouth daily.     Multiple Vitamins-Minerals (HAIR/SKIN/NAILS/BIOTIN PO) Take 1 capsule by mouth daily.     Multiple Vitamins-Minerals (PRESERVISION AREDS 2+MULTI VIT PO) Take 1 capsule by mouth daily.     omeprazole (PRILOSEC) 40 MG capsule Take 40 mg by mouth in the morning and at bedtime.     ondansetron (ZOFRAN) 4 MG tablet Take 4 mg by mouth 3 (three) times daily as needed for nausea or vomiting.     Propylene Glycol (SYSTANE BALANCE) 0.6 % SOLN Place 1 drop into both eyes as needed (dry eyes).     rivaroxaban (XARELTO) 20 MG TABS tablet Take 1 tablet (20 mg total) by mouth daily with supper. 90 tablet 1   rosuvastatin (CRESTOR) 20 MG tablet TAKE 1 TABLET BY MOUTH EVERY DAY 90 tablet 3   sucralfate (CARAFATE) 1 g tablet Take 1 tablet (1 g total) by mouth 4 (four) times daily -  with meals and at bedtime. (Patient not taking: Reported on 05/29/2022) 40 tablet 0   traZODone (DESYREL) 50 MG tablet Take 1.5 tablets (75 mg total) by mouth at bedtime. (Patient taking differently: Take 50 mg by mouth at bedtime.) 135 tablet 1   No facility-administered medications prior to visit.    Review of Systems  Constitutional:  Negative for chills, fever, malaise/fatigue and weight loss.  HENT:  Negative for hearing loss, sore throat and tinnitus.   Eyes:  Negative for blurred vision and double vision.  Respiratory:  Positive for cough. Negative for hemoptysis, sputum production, shortness of breath, wheezing and stridor.   Cardiovascular:  Negative for  chest pain, palpitations, orthopnea, leg swelling and PND.  Gastrointestinal:   Negative for abdominal pain, constipation, diarrhea, heartburn, nausea and vomiting.  Genitourinary:  Negative for dysuria, hematuria and urgency.  Musculoskeletal:  Negative for joint pain and myalgias.  Skin:  Negative for itching and rash.  Neurological:  Negative for dizziness, tingling, weakness and headaches.  Endo/Heme/Allergies:  Negative for environmental allergies. Does not bruise/bleed easily.  Psychiatric/Behavioral:  Negative for depression. The patient is not nervous/anxious and does not have insomnia.   All other systems reviewed and are negative.    Objective:  Physical Exam Vitals reviewed.  Constitutional:      General: She is not in acute distress.    Appearance: She is well-developed.  HENT:     Head: Normocephalic and atraumatic.  Eyes:     General: No scleral icterus.    Conjunctiva/sclera: Conjunctivae normal.     Pupils: Pupils are equal, round, and reactive to light.  Neck:     Vascular: No JVD.     Trachea: No tracheal deviation.  Cardiovascular:     Rate and Rhythm: Normal rate and regular rhythm.     Heart sounds: Normal heart sounds. No murmur heard. Pulmonary:     Effort: Pulmonary effort is normal. No tachypnea, accessory muscle usage or respiratory distress.     Breath sounds: No stridor. No wheezing, rhonchi or rales.  Abdominal:     General: Bowel sounds are normal. There is no distension.     Palpations: Abdomen is soft.     Tenderness: There is no abdominal tenderness.  Musculoskeletal:        General: No tenderness.     Cervical back: Neck supple.  Lymphadenopathy:     Cervical: No cervical adenopathy.  Skin:    General: Skin is warm and dry.     Capillary Refill: Capillary refill takes less than 2 seconds.     Findings: No rash.  Neurological:     Mental Status: She is alert and oriented to person, place, and time.  Psychiatric:        Behavior: Behavior normal.      Vitals:   07/05/22 0915  BP: 100/70  Pulse: (!) 51   SpO2: 98%  Weight: 159 lb (72.1 kg)  Height: '5\' 1"'$  (8.921 m)   98% on RA BMI Readings from Last 3 Encounters:  07/05/22 30.04 kg/m  06/29/22 30.17 kg/m  06/15/22 29.10 kg/m   Wt Readings from Last 3 Encounters:  07/05/22 159 lb (72.1 kg)  06/29/22 155 lb (70.3 kg)  06/15/22 154 lb (69.9 kg)     CBC    Component Value Date/Time   WBC 6.7 06/15/2022 0935   RBC 4.43 06/15/2022 0935   HGB 13.1 06/15/2022 0935   HCT 40.1 06/15/2022 0935   PLT 239 06/15/2022 0935   MCV 90.5 06/15/2022 0935   MCH 29.6 06/15/2022 0935   MCHC 32.7 06/15/2022 0935   RDW 12.5 06/15/2022 0935   LYMPHSABS 1.3 09/24/2019 1224   MONOABS 0.6 09/24/2019 1224   EOSABS 0.1 09/24/2019 1224   BASOSABS 0.1 09/24/2019 1224     Chest Imaging: October 2023 nuclear medicine pet imaging: Hypermetabolic lesion within the left upper lobe with associated adenopathy in the chest. The patient's images have been independently reviewed by me.    Pulmonary Functions Testing Results:    Latest Ref Rng & Units 08/29/2016    4:08 PM  PFT Results  FVC-Pre L 2.57   FVC-Predicted Pre % 93  FVC-Post L 2.63   FVC-Predicted Post % 95   Pre FEV1/FVC % % 83   Post FEV1/FCV % % 84   FEV1-Pre L 2.13   FEV1-Predicted Pre % 101   FEV1-Post L 2.21   DLCO uncorrected ml/min/mmHg 15.17   DLCO UNC% % 75   DLVA Predicted % 86   TLC L 4.20   TLC % Predicted % 91   RV % Predicted % 73     FeNO:   Pathology:   Echocardiogram:   Heart Catheterization:     Assessment & Plan:     ICD-10-CM   1. Sarcoidosis  D86.9 CT Chest Wo Contrast    2. Abnormal PET of left lung  R94.2       Discussion:  I think for all intensive purposes the patient likely has sarcoidosis.  The pattern does not necessarily so systemic disease within the lung bilaterally but she does have disease in the left lung with associated adenopathy.  She has endobronchial disease.  All biopsies from both bronchoscopies show granulomatous  inflammation.  Plan: I think next best step would be empiric treatments with prednisone to see if this improves. We will start her on 0.5 mg/kg, 30 mg of prednisone daily. We will have repeat CT scan imaging in approximately 6 to 8 weeks. Patient was instructed to take medication in the morning with breakfast. After repeat imaging if it shows improvement we will potentially start considerations for tapering. At that point she will also need referrals to ensure routine eye exam. I will place a referral for echocardiogram hopefully to be complete prior to next office visit.    Current Outpatient Medications:    fluticasone (FLONASE) 50 MCG/ACT nasal spray, SPRAY 2 SPRAYS INTO EACH NOSTRIL EVERY DAY, Disp: 48 mL, Rfl: 1   predniSONE (DELTASONE) 10 MG tablet, Take 3 tablets (30 mg total) by mouth daily with breakfast., Disp: 180 tablet, Rfl: 1   alendronate (FOSAMAX) 70 MG tablet, TAKE 1 TABLET BY MOUTH EVERY 7 DAYS, TAKE WITH A FULL GLASS OF WATER ON AN EMPTY STOMACH (Patient taking differently: Take 70 mg by mouth every Sunday.), Disp: 12 tablet, Rfl: 3   amLODipine (NORVASC) 5 MG tablet, Take 1 tablet (5 mg total) by mouth daily., Disp: 90 tablet, Rfl: 3   atenolol (TENORMIN) 50 MG tablet, Take 1 tablet (50 mg total) by mouth daily. (Patient taking differently: Take 50 mg by mouth at bedtime.), Disp: 90 tablet, Rfl: 3   benzonatate (TESSALON) 200 MG capsule, Take 1 capsule (200 mg total) by mouth 3 (three) times daily as needed for cough. (Patient not taking: Reported on 07/05/2022), Disp: 30 capsule, Rfl: 1   Calcium Carb-Cholecalciferol (CALCIUM 600 + D PO), Take 1 tablet by mouth in the morning and at bedtime., Disp: , Rfl:    doxycycline (VIBRA-TABS) 100 MG tablet, Take 1 tablet (100 mg total) by mouth 2 (two) times daily. (Patient not taking: Reported on 06/08/2022), Disp: 20 tablet, Rfl: 0   flecainide (TAMBOCOR) 100 MG tablet, TAKE 1 TABLET BY MOUTH 2 TIMES DAILY, Disp: 180 tablet, Rfl:  3   FLUoxetine (PROZAC) 40 MG capsule, Take 1 capsule (40 mg total) by mouth 2 (two) times daily., Disp: 180 capsule, Rfl: 3   HYDROcodone bit-homatropine (HYCODAN) 5-1.5 MG/5ML syrup, Take 5 mLs by mouth every 6 (six) hours as needed for cough., Disp: 240 mL, Rfl: 0   Krill Oil 500 MG CAPS, Take 500 mg by mouth daily., Disp: , Rfl:  levothyroxine (SYNTHROID) 125 MCG tablet, TAKE 1 TABLET BY MOUTH DAILY BEFORE BREAKFAST., Disp: 90 tablet, Rfl: 3   magnesium oxide (MAG-OX) 400 (240 Mg) MG tablet, Take 400 mg by mouth daily., Disp: , Rfl:    Multiple Vitamins-Minerals (HAIR/SKIN/NAILS/BIOTIN PO), Take 1 capsule by mouth daily., Disp: , Rfl:    Multiple Vitamins-Minerals (PRESERVISION AREDS 2+MULTI VIT PO), Take 1 capsule by mouth daily., Disp: , Rfl:    omeprazole (PRILOSEC) 40 MG capsule, Take 40 mg by mouth in the morning and at bedtime., Disp: , Rfl:    ondansetron (ZOFRAN) 4 MG tablet, Take 4 mg by mouth 3 (three) times daily as needed for nausea or vomiting., Disp: , Rfl:    Propylene Glycol (SYSTANE BALANCE) 0.6 % SOLN, Place 1 drop into both eyes as needed (dry eyes)., Disp: , Rfl:    rivaroxaban (XARELTO) 20 MG TABS tablet, Take 1 tablet (20 mg total) by mouth daily with supper., Disp: 90 tablet, Rfl: 1   rosuvastatin (CRESTOR) 20 MG tablet, TAKE 1 TABLET BY MOUTH EVERY DAY, Disp: 90 tablet, Rfl: 3   sucralfate (CARAFATE) 1 g tablet, Take 1 tablet (1 g total) by mouth 4 (four) times daily -  with meals and at bedtime. (Patient not taking: Reported on 05/29/2022), Disp: 40 tablet, Rfl: 0   traZODone (DESYREL) 50 MG tablet, Take 1.5 tablets (75 mg total) by mouth at bedtime. (Patient taking differently: Take 50 mg by mouth at bedtime.), Disp: 135 tablet, Rfl: 1    Garner Nash, DO [facility] 07/05/2022 9:46 AM

## 2022-07-06 MED ORDER — ALPRAZOLAM 0.5 MG PO TABS: 0.2500 mg | ORAL_TABLET | Freq: Two times a day (BID) | ORAL | 0 refills | Status: DC | PRN

## 2022-07-06 MED ORDER — HYDROXYZINE HCL 10 MG PO TABS: 10.0000 mg | ORAL_TABLET | Freq: Three times a day (TID) | ORAL | 3 refills | Status: DC | PRN

## 2022-07-08 DIAGNOSIS — J029 Acute pharyngitis, unspecified: Secondary | ICD-10-CM | POA: Diagnosis not present

## 2022-07-08 DIAGNOSIS — Z20822 Contact with and (suspected) exposure to covid-19: Secondary | ICD-10-CM | POA: Diagnosis not present

## 2022-07-11 DIAGNOSIS — F321 Major depressive disorder, single episode, moderate: Secondary | ICD-10-CM | POA: Diagnosis not present

## 2022-07-12 ENCOUNTER — Ambulatory Visit: Payer: Medicare Other | Admitting: Internal Medicine

## 2022-07-13 ENCOUNTER — Encounter: Payer: Self-pay | Admitting: Family Medicine

## 2022-07-13 ENCOUNTER — Telehealth: Payer: Self-pay | Admitting: Family Medicine

## 2022-07-13 ENCOUNTER — Other Ambulatory Visit: Payer: Self-pay | Admitting: Family Medicine

## 2022-07-13 DIAGNOSIS — F419 Anxiety disorder, unspecified: Secondary | ICD-10-CM

## 2022-07-13 NOTE — Telephone Encounter (Signed)
Please advise 

## 2022-07-13 NOTE — Telephone Encounter (Signed)
Patient called to advise that she is unable to take the trazadone with hydroxyzine and it is keeping her from sleeping. Patient would like guidance on what to do. Should she wait until she has a reaction from prednisone before she takes the hydroxyzine. Please call to advise.

## 2022-07-13 NOTE — Telephone Encounter (Signed)
Sent her a mychart message

## 2022-07-14 ENCOUNTER — Ambulatory Visit (HOSPITAL_COMMUNITY)
Admission: RE | Admit: 2022-07-14 | Discharge: 2022-07-14 | Disposition: A | Discharge status: Home / Self Care | Payer: Medicare Other | Source: Ambulatory Visit | Attending: Pulmonary Disease | Admitting: Pulmonary Disease

## 2022-07-14 DIAGNOSIS — D869 Sarcoidosis, unspecified: Secondary | ICD-10-CM | POA: Diagnosis not present

## 2022-07-14 DIAGNOSIS — I10 Essential (primary) hypertension: Secondary | ICD-10-CM | POA: Diagnosis not present

## 2022-07-14 DIAGNOSIS — I4891 Unspecified atrial fibrillation: Secondary | ICD-10-CM | POA: Diagnosis not present

## 2022-07-14 DIAGNOSIS — G4730 Sleep apnea, unspecified: Secondary | ICD-10-CM | POA: Diagnosis not present

## 2022-07-14 DIAGNOSIS — E785 Hyperlipidemia, unspecified: Secondary | ICD-10-CM | POA: Diagnosis not present

## 2022-07-14 LAB — ECHOCARDIOGRAM COMPLETE
Area-P 1/2: 3.06 cm2
S' Lateral: 3 cm

## 2022-07-14 NOTE — Progress Notes (Signed)
  Echocardiogram 2D Echocardiogram has been performed.  Lisa Acosta 07/14/2022, 3:50 PM

## 2022-07-21 ENCOUNTER — Ambulatory Visit: Payer: Medicare Other | Admitting: Nurse Practitioner

## 2022-07-23 ENCOUNTER — Other Ambulatory Visit: Payer: Self-pay | Admitting: Family Medicine

## 2022-07-23 DIAGNOSIS — I480 Paroxysmal atrial fibrillation: Secondary | ICD-10-CM

## 2022-07-23 NOTE — Progress Notes (Unsigned)
Cardiology Clinic Note   Patient Name: Lisa Acosta Date of Encounter: 07/24/2022  Primary Care Provider:  Darreld Mclean, MD Primary Cardiologist:  Lisa Ruths, MD  Patient Profile    Lisa Acosta 71 year old female presents to the clinic today for follow-up evaluation of her atrial fibrillation and essential hypertension.  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   Pre-diabetes    Past Surgical History:  Procedure Laterality Date   BREAST BIOPSY Left    needle core biopsy, benign   BRONCHIAL BIOPSY  06/15/2022   Procedure: BRONCHIAL BIOPSIES;  Surgeon: Lisa Geralds, MD;  Location: [facility] ENDOSCOPY;  Service: Pulmonary;;   BRONCHIAL BIOPSY  06/29/2022   Procedure: BRONCHIAL BIOPSIES;  Surgeon: Lisa Nash, DO;  Location: [place] ENDOSCOPY;  Service: Pulmonary;;   BRONCHIAL NEEDLE ASPIRATION BIOPSY  06/15/2022   Procedure: BRONCHIAL NEEDLE ASPIRATION BIOPSIES;  Surgeon: Lisa Geralds, MD;  Location: [facility] ENDOSCOPY;  Service: Pulmonary;;   BRONCHIAL NEEDLE ASPIRATION BIOPSY  06/29/2022   Procedure: BRONCHIAL NEEDLE ASPIRATION BIOPSIES;  Surgeon: Lisa Nash, DO;  Location: [place] ENDOSCOPY;  Service: Pulmonary;;   BRONCHIAL WASHINGS  06/15/2022   Procedure: BRONCHIAL WASHINGS;  Surgeon: Lisa Geralds, MD;  Location: [facility] ENDOSCOPY;  Service: Pulmonary;;   CATARACT EXTRACTION     DILATION AND  CURETTAGE OF UTERUS     EYE SURGERY     HAND SURGERY     KNEE SURGERY     ROTATOR CUFF REPAIR     TOTAL ABDOMINAL HYSTERECTOMY     VIDEO BRONCHOSCOPY N/A 06/15/2022   Procedure: VIDEO BRONCHOSCOPY WITH FLUORO;  Surgeon: Lisa Geralds, MD;  Location: [facility] ENDOSCOPY;  Service: Pulmonary;  Laterality: N/A;   VIDEO BRONCHOSCOPY WITH ENDOBRONCHIAL ULTRASOUND  06/15/2022   Procedure: VIDEO BRONCHOSCOPY WITH ENDOBRONCHIAL ULTRASOUND;  Surgeon: Lisa Geralds, MD;  Location: [facility] ENDOSCOPY;  Service: Pulmonary;;   VIDEO BRONCHOSCOPY WITH ENDOBRONCHIAL ULTRASOUND N/A 06/29/2022   Procedure: VIDEO BRONCHOSCOPY WITH ENDOBRONCHIAL ULTRASOUND;  Surgeon: Lisa Nash, DO;  Location: [place];  Service: Pulmonary;  Laterality: N/A;    Allergies  Allergies  Allergen Reactions   Dextran Anaphylaxis   Tree Extract    Penicillin G Rash   Penicillins Rash   Sulfa Antibiotics Rash    History of Present Illness    Lisa Acosta has a PMH of HTN, PAF, OSA, hyperlipidemia, obesity, dyspnea, generalized anxiety disorder, depression, memory difficulty and vitamin D insufficiency.  Her cardiac event monitor 3/17 showed sinus rhythm with paroxysmal atrial fibrillation.  She was treated with flecainide.  She underwent exercise treadmill stress testing 10/18 which showed no exercise-induced VT did not note any ST changes.  Her echocardiogram in 2019 at [place] showed normal LV function, severe left atrial enlargement, mild RV dysfunction, mild mitral  regurgitation.  Her abdominal ultrasound 11/20 showed no aneurysm.  She was seen in follow-up by Dr. Stanford Acosta on 01/25/2021.  During that time she denied dyspnea, chest pain, palpitations and syncope.  She denied bleeding issues.  She was admitted to the hospital on 06/29/2022 for robotic assisted bronchoscopy.  She reported pneumonia 7/23 with cough and left-sided pain.  Her cough has been ongoing since that time.  Her chest CT showed persistent  left upper lobe consolidation from July to September.  She continues to follow with pulmonology.  She presents to the clinic today for follow-up evaluation and states she feels fairly well today.  She has not been walking as much due to the cooler weather.  She does walk 20 to 30 minutes 3 times per week.  She is looking for an indoor track to continue walking over the winter.  We reviewed her most recent echocardiogram and CT results.  She expressed understanding.  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  She is currently on prednisone which increases her appetite.  She is tolerating her cardiac medications well and denies side effects.  She continues to follow at [facility] as well.  We will plan follow-up in 9 to 12 months.  Today she denies chest pain, shortness of breath, lower extremity edema, fatigue, palpitations, melena, hematuria, hemoptysis, diaphoresis, weakness, presyncope, syncope, orthopnea, and PND.    Home Medications    Prior to Admission medications   Medication Sig Start Date End Date Taking? Authorizing Provider  alendronate (FOSAMAX) 70 MG tablet TAKE 1 TABLET BY MOUTH EVERY 7 DAYS, TAKE WITH A FULL GLASS OF WATER ON AN EMPTY STOMACH Patient taking differently: Take 70 mg by mouth every Sunday. 05/17/22   Acosta, Lisa Filler, MD  ALPRAZolam Lisa Acosta) 0.5 MG tablet Take 0.5-1 tablets (0.25-0.5 mg total) by mouth 2 (two) times daily as needed for anxiety. 07/06/22   Acosta, Lisa Filler, MD  amLODipine (NORVASC) 5 MG tablet Take 1 tablet (5 mg total) by mouth daily. 12/29/21   Acosta, Lisa Filler, MD  atenolol (TENORMIN) 50 MG tablet Take 1 tablet (50 mg total) by mouth daily. Patient taking differently: Take 50 mg by mouth at bedtime. 12/29/21   Acosta, Lisa Filler, MD  benzonatate (TESSALON) 200 MG capsule Take 1 capsule (200 mg total) by mouth 3 (three) times daily as needed for cough. Patient not taking: Reported on 07/05/2022 06/22/22   Lisa Geralds, MD  Calcium  Carb-Cholecalciferol (CALCIUM 600 + D PO) Take 1 tablet by mouth in the morning and at bedtime.    [provider]  doxycycline (VIBRA-TABS) 100 MG tablet Take 1 tablet (100 mg total) by mouth 2 (two) times daily. Patient not taking: Reported on 06/08/2022 05/19/22   Acosta, Lisa Filler, MD  flecainide (TAMBOCOR) 100 MG tablet TAKE 1 TABLET BY MOUTH 2 TIMES DAILY 12/09/21   Acosta, Lisa Filler, MD  FLUoxetine (PROZAC) 40 MG capsule Take 1 capsule (40 mg total) by mouth 2 (two) times daily. 12/29/21   Acosta, Lisa Filler, MD  fluticasone (FLONASE) 50 MCG/ACT nasal spray SPRAY 2 SPRAYS INTO EACH NOSTRIL EVERY DAY 11/28/21   Saguier, Percell Miller, PA-C  HYDROcodone bit-homatropine (HYCODAN) 5-1.5 MG/5ML syrup Take 5 mLs by mouth every 6 (six) hours as needed for cough. 06/20/22   Lisa Geralds, MD  hydrOXYzine (ATARAX) 10 MG tablet Take 1-2 tablets (10-20 mg total) by mouth 3 (three) times daily as needed for anxiety. 07/06/22   Acosta, Lisa Filler, MD  Krill Oil 500 MG CAPS Take 500 mg by mouth daily.    [provider]  levothyroxine (SYNTHROID) 125 MCG tablet TAKE 1 TABLET BY MOUTH DAILY BEFORE BREAKFAST. 02/10/22   Acosta, Lisa Filler, MD  magnesium oxide (MAG-OX) 400 (240 Mg) MG tablet Take 400 mg by mouth daily.    [provider]  Multiple Vitamins-Minerals (HAIR/SKIN/NAILS/BIOTIN PO) Take 1 capsule by mouth daily.    [provider]  Multiple Vitamins-Minerals (PRESERVISION AREDS 2+MULTI VIT PO) Take 1 capsule by mouth daily.    [provider]  omeprazole (PRILOSEC) 40 MG capsule Take 40 mg by mouth in the morning and at bedtime.    [provider]  ondansetron (ZOFRAN) 4 MG tablet Take 4 mg by mouth 3 (three) times daily as needed for nausea or vomiting. 04/20/22   [provider]  predniSONE (DELTASONE) 10 MG tablet Take 3 tablets (30 mg total) by mouth daily with breakfast. 07/05/22 09/03/22  Icard, Octavio Graves, DO  Propylene Glycol (SYSTANE  BALANCE) 0.6 % SOLN Place 1 drop into both eyes as needed (dry eyes).    [provider]  rivaroxaban (XARELTO) 20 MG TABS tablet Take 1 tablet (20 mg total) by mouth daily with supper. 05/01/22   Acosta, Lisa Filler, MD  rosuvastatin (CRESTOR) 20 MG tablet TAKE 1 TABLET BY MOUTH EVERY DAY 12/19/21   Acosta, Lisa Filler, MD  sucralfate (CARAFATE) 1 g tablet Take 1 tablet (1 g total) by mouth 4 (four) times daily -  with meals and at bedtime. Patient not taking: Reported on 05/29/2022 04/25/22   Acosta, Lisa Filler, MD  traZODone (DESYREL) 50 MG tablet Take 1.5 tablets (75 mg total) by mouth at bedtime. Patient taking differently: Take 50 mg by mouth at bedtime. 09/22/21   Acosta, Lisa Filler, MD    Family History    Family History  Problem Relation Age of Onset   Atrial fibrillation Mother    Congestive Heart Failure Mother    Depression Mother    Heart disease Mother    Stroke Mother    Thyroid disease Mother    Anxiety disorder Mother    Obesity Mother    Heart disease Father    Alcohol abuse Father    High blood pressure Father    High Cholesterol Father    Alcoholism Father    Depression Sister    Heart disease Brother    Heart disease Brother    Depression Daughter    She indicated that her mother is deceased. She indicated that her father is deceased. She indicated that the status of her sister is unknown. She indicated that both of her brothers are alive. She indicated that the status of her daughter is unknown.  Social History    Social History   Socioeconomic History   Marital status: Married    Spouse name: John   Number of children: 1   Years of education: [employer] education level: Not on file  Occupational History   Occupation: Retired    Fish farm manager: OTHER    Comment: [employer]  Tobacco Use   Smoking status: Never   Smokeless tobacco: Never  Vaping Use   Vaping Use: Never used  Substance and Sexual Activity   Alcohol use: Yes     Comment: rare   Drug use: No   Sexual activity: Not on file  Other Topics Concern   Not on file  Social History Narrative   Patient lives  at home spouse.   Caffeine use: 2 sodas weekly   Right handed    Social Determinants of Health   Financial Resource Strain: Low Risk  (10/27/2021)   Overall Financial Resource Strain (CARDIA)    Difficulty of Paying Living Expenses: Not hard at all  Food Insecurity: No Food Insecurity (10/27/2021)   Hunger Vital Sign    Worried About Running Out of Food in the Last Year: Never true    Ran Out of Food in the Last Year: Never true  Transportation Needs: No Transportation Needs (10/27/2021)   PRAPARE - Hydrologist (Medical): No    Lack of Transportation (Non-Medical): No  Physical Activity: Inactive (09/26/2018)   Exercise Vital Sign    Days of Exercise per Week: 0 days    Minutes of Exercise per Session: 0 min  Stress: No Stress Concern Present (10/27/2021)   [place]    Feeling of Stress : Only a little  Social Connections: Socially Integrated (10/27/2021)   Social Connection and Isolation Panel [NHANES]    Frequency of Communication with Friends and Family: Once a week    Frequency of Social Gatherings with Friends and Family: More than three times a week    Attends Religious Services: More than 4 times per year    Active Member of [employer] or Organizations: Yes    Attends Music therapist: More than 4 times per year    Marital Status: Married  Human resources officer Violence: Not At Risk (10/27/2021)   Humiliation, Afraid, Rape, and Kick questionnaire    Fear of Current or Ex-Partner: No    Emotionally Abused: No    Physically Abused: No    Sexually Abused: No     Review of Systems    General:  No chills, fever, night sweats or weight changes.  Cardiovascular:  No chest pain, dyspnea on exertion, edema, orthopnea, palpitations, paroxysmal  nocturnal dyspnea. Dermatological: No rash, lesions/masses Respiratory: No cough, dyspnea Urologic: No hematuria, dysuria Abdominal:   No nausea, vomiting, diarrhea, bright red blood per rectum, melena, or hematemesis Neurologic:  No visual changes, wkns, changes in mental status. All other systems reviewed and are otherwise negative except as noted above.  Physical Exam    VS:  BP 118/64   Pulse 62   Ht '5\' 1"'$  (1.549 m)   Wt 162 lb 9.6 oz (73.8 kg)   SpO2 97%   BMI 30.72 kg/m  , BMI Body mass index is 30.72 kg/m. GEN: Well nourished, well developed, in no acute distress. HEENT: normal. Neck: Supple, no JVD, carotid bruits, or masses. Cardiac: RRR, no murmurs, rubs, or gallops. No clubbing, cyanosis, edema.  Radials/DP/PT 2+ and equal bilaterally.  Respiratory:  Respirations regular and unlabored, clear to auscultation bilaterally. GI: Soft, nontender, nondistended, BS + x 4. MS: no deformity or atrophy. Skin: warm and dry, no rash. Neuro:  Strength and sensation are intact. Psych: Normal affect.  Accessory Clinical Findings    Recent Labs: 11/16/2021: TSH 0.86 03/21/2022: ALT 34 06/15/2022: BUN 15; Creatinine, Ser 0.66; Hemoglobin 13.1; Platelets 239; Potassium 3.9; Sodium 137   Recent Lipid Panel    Component Value Date/Time   CHOL 180 11/16/2021 0923   CHOL 186 01/05/2021 1040   TRIG 108.0 11/16/2021 0923   HDL 65.10 11/16/2021 0923   HDL 67 01/05/2021 1040   CHOLHDL 3 11/16/2021 0923   VLDL 21.6 11/16/2021 0923   LDLCALC 93 11/16/2021  [place] [place] 01/05/2021 1040   LDLCALC 103 (H) 06/23/2020 1148   LDLDIRECT 112.0 01/22/2017 1559         ECG personally reviewed by me today-none today.  Echocardiogram 07/14/2022  Conclusion(s)/Recommendation(s): Consider CMR if clinically indicated.   FINDINGS   Left Ventricle: Left ventricular ejection fraction, by estimation, is 65  to 70%. The left ventricle has normal function. The left ventricle  demonstrates  regional wall motion abnormalities. The left ventricular  internal cavity size was normal in size.  There is no left ventricular hypertrophy. Left ventricular diastolic  parameters are consistent with Grade I diastolic dysfunction (impaired  relaxation).     LV Wall Scoring:  The basal anteroseptal segment and basal inferoseptal segment are  hypokinetic.   Right Ventricle: The right ventricular size is normal. No increase in  right ventricular wall thickness. Right ventricular systolic function is  normal.   Left Atrium: Left atrial size was normal in size.   Right Atrium: Right atrial size was normal in size.   Pericardium: Trivial pericardial effusion is present. The pericardial  effusion is circumferential. Presence of epicardial fat layer.   Mitral Valve: The mitral valve is grossly normal. No evidence of mitral  valve regurgitation. No evidence of mitral valve stenosis.   Tricuspid Valve: The tricuspid valve is normal in structure. Tricuspid  valve regurgitation is not demonstrated. No evidence of tricuspid  stenosis.   Aortic Valve: The aortic valve was not well visualized. Aortic valve  regurgitation is not visualized. Aortic valve sclerosis is present, with  no evidence of aortic valve stenosis.   Pulmonic Valve: The pulmonic valve was grossly normal. Pulmonic valve  regurgitation is trivial. No evidence of pulmonic stenosis.   Aorta: The aortic root and ascending aorta are structurally normal, with  no evidence of dilitation.   Venous: The inferior vena cava is normal in size with greater than 50%  respiratory variability, suggesting right atrial pressure of 3 mmHg.   IAS/Shunts: No atrial level shunt detected by color flow Doppler.    Assessment & Plan   1.  Paroxysmal atrial fibrillation-denies recent episodes of irregular or accelerated heartbeat.  Reports compliance with Xarelto and denies bleeding issues.  Notes that she is in the donut hole.  Will provide  Xarelto samples. Continue Xarelto, atenolol, flecainide Heart healthy low-sodium diet-salty 6 given Increase physical activity as tolerated Avoid triggers caffeine, chocolate, EtOH, dehydration etc.  Essential hypertension-BP today 118/64 Continue atenolol, amlodipine Heart healthy low-sodium diet-salty 6 given Increase physical activity as tolerated Request labs from PCP  Hyperlipidemia-compliant with statin therapy.  Denies side effects. Continue Crestor Follows with PCP  Disposition: Follow-up with Dr. Stanford Acosta or me in 9-12 year.   [name]. Elky Funches NP-C     07/24/2022, 9:08 AM [place] [address] Office [phone] Fax [phone]    I spent 14 minutes examining this patient, reviewing medications, and using patient centered shared decision making involving her cardiac care.  Prior to her visit I spent greater than 20 minutes reviewing her past medical history,  medications, and prior cardiac tests.

## 2022-07-24 ENCOUNTER — Encounter: Payer: Self-pay | Admitting: General Practice

## 2022-07-24 ENCOUNTER — Ambulatory Visit: Payer: Medicare Other | Attending: Nurse Practitioner | Admitting: General Practice

## 2022-07-24 VITALS — BP 118/64 | HR 62 | Ht 61.0 in | Wt 162.6 lb

## 2022-07-24 DIAGNOSIS — I10 Essential (primary) hypertension: Secondary | ICD-10-CM | POA: Diagnosis not present

## 2022-07-24 DIAGNOSIS — E7800 Pure hypercholesterolemia, unspecified: Secondary | ICD-10-CM | POA: Diagnosis not present

## 2022-07-24 DIAGNOSIS — I480 Paroxysmal atrial fibrillation: Secondary | ICD-10-CM | POA: Diagnosis not present

## 2022-07-24 MED ORDER — RIVAROXABAN 20 MG PO TABS: 20.0000 mg | ORAL_TABLET | Freq: Every day | ORAL | 0 refills | Status: DC

## 2022-07-24 NOTE — Patient Instructions (Signed)
Medication Instructions:  No Changes *If you need a refill on your cardiac medications before your next appointment, please call your pharmacy*   Lab Work: No labs If you have labs (blood work) drawn today and your tests are completely normal, you will receive your results only by: [place] (if you have MyChart) OR A paper copy in the mail If you have any lab test that is abnormal or we need to change your treatment, we will call you to review the results.   Testing/Procedures: No Testing   Follow-Up: At [facility], you and your health needs are our priority.  As part of our continuing mission to provide you with exceptional heart care, we have created designated Provider Care Teams.  These Care Teams include your primary Cardiologist (physician) and Advanced Practice Providers (APPs -  Physician Assistants and Nurse Practitioners) who all work together to provide you with the care you need, when you need it.  We recommend signing up for the patient portal called "MyChart".  Sign up information is provided on this After Visit Summary.  MyChart is used to connect with patients for Virtual Visits (Telemedicine).  Patients are able to view lab/test results, encounter notes, upcoming appointments, etc.  Non-urgent messages can be sent to your provider as well.   To learn more about what you can do with MyChart, go to [email or web address].    Your next appointment:   9 month(s)- 1 year  The format for your next appointment:   In Person  Provider:   Kirk Ruths, MD

## 2022-07-26 DIAGNOSIS — K219 Gastro-esophageal reflux disease without esophagitis: Secondary | ICD-10-CM | POA: Diagnosis not present

## 2022-07-27 ENCOUNTER — Encounter: Payer: Self-pay | Admitting: Family Medicine

## 2022-07-27 ENCOUNTER — Telehealth: Payer: Self-pay | Admitting: Family Medicine

## 2022-07-27 NOTE — Telephone Encounter (Signed)
Medication: omeprazole (PRILOSEC) 40 MG capsule   Has the patient contacted their pharmacy? Yes.     Preferred Pharmacy (with phone number or street name):  [id number] [place] [id number]: [phone] Fax: [phone]

## 2022-07-27 NOTE — Telephone Encounter (Signed)
I do not see that your name has ever been on this Rx, okay to send in?

## 2022-07-31 LAB — FUNGUS CULTURE WITH STAIN

## 2022-07-31 LAB — FUNGAL ORGANISM REFLEX

## 2022-07-31 LAB — FUNGUS CULTURE RESULT

## 2022-07-31 NOTE — Telephone Encounter (Signed)
Pt called to follow up on med refill status. Advised pt that it hadn't been sent in yet due to a confirmatio Dr. Lorelei Pont had sent her. Read message from Dr. Lorelei Pont to pt and pt confirmed that the information was correct and to proceed with sending it in.

## 2022-08-01 ENCOUNTER — Ambulatory Visit: Payer: Medicare Other | Admitting: Nurse Practitioner

## 2022-08-01 MED ORDER — OMEPRAZOLE 40 MG PO CPDR: 40.0000 mg | DELAYED_RELEASE_CAPSULE | Freq: Two times a day (BID) | ORAL | 0 refills | Status: DC

## 2022-08-01 NOTE — Progress Notes (Signed)
I will review ECHO results with patient at next office visit. I have reviewed them and everything looks good. Normal cardiac function   Thanks,  BLI  Garner Nash, DO [facility] 08/01/2022 9:12 AM

## 2022-08-01 NOTE — Telephone Encounter (Signed)
Rx sent 

## 2022-08-10 DIAGNOSIS — F321 Major depressive disorder, single episode, moderate: Secondary | ICD-10-CM | POA: Diagnosis not present

## 2022-08-12 LAB — ACID FAST CULTURE WITH REFLEXED SENSITIVITIES (MYCOBACTERIA): Acid Fast Culture: NEGATIVE

## 2022-08-14 ENCOUNTER — Ambulatory Visit (HOSPITAL_BASED_OUTPATIENT_CLINIC_OR_DEPARTMENT_OTHER)
Admission: RE | Admit: 2022-08-14 | Discharge: 2022-08-14 | Disposition: A | Discharge status: Home / Self Care | Payer: Medicare Other | Source: Ambulatory Visit | Attending: Pulmonary Disease | Admitting: Pulmonary Disease

## 2022-08-14 ENCOUNTER — Ambulatory Visit (INDEPENDENT_AMBULATORY_CARE_PROVIDER_SITE_OTHER): Payer: Medicare Other | Admitting: Pulmonary Disease

## 2022-08-14 ENCOUNTER — Encounter: Payer: Self-pay | Admitting: Pulmonary Disease

## 2022-08-14 VITALS — BP 124/78 | HR 58 | Temp 98.4°F | Ht 64.0 in | Wt 166.2 lb

## 2022-08-14 DIAGNOSIS — G4734 Idiopathic sleep related nonobstructive alveolar hypoventilation: Secondary | ICD-10-CM | POA: Diagnosis not present

## 2022-08-14 DIAGNOSIS — R918 Other nonspecific abnormal finding of lung field: Secondary | ICD-10-CM | POA: Diagnosis not present

## 2022-08-14 DIAGNOSIS — D869 Sarcoidosis, unspecified: Secondary | ICD-10-CM | POA: Diagnosis not present

## 2022-08-14 NOTE — Progress Notes (Signed)
Synopsis: Referred in October 2023 for post bronchoscopy follow-up by Copland, Gay Filler, MD  Subjective:   PATIENT ID: Lisa Acosta GENDER: female DOB: 04-May-1951, MRN: [id number]  Chief Complaint  Patient presents with   Follow-up    Cough just started, lung pain, increased mucus      This is a 71 year old female, past medical history of gastroesophageal reflux, hypertension, hyperlipidemia, atrial fibrillation patient is a lifelong never smoker.  She originally had an abnormal chest x-ray imaging axial CT imaging concern for persistent infiltrate and adenopathy concerning for underlying malignancy.  She had PET scan that was concerning.  She was taken for bronchoscopy with bronchoscopy endobronchial ultrasound which revealed granulomatous inflammation on pathology.  Due to the pattern seen on the PET scan there was still underlying concern for malignancy and the decision was made for repeat bronchoscopy and biopsy.  She was initially scheduled with Dr. Verlee Monte however he was unable to complete the procedure and the patient had the procedure completed by me.  This also revealed granulomatous inflammation.  She additionally had small little lesions within the subsegments of the airway which showed and appeared as if endobronchial granuloma formation.  Biopsy of these also revealed granulomatous disease.  Her cultures are still pending but have no growth to date.  That is for AFB and fungus.  OV 08/14/2022: here today for follow. CT compelted this AM. Not read by radiology but the consolidation appears smaller in size.  Her cough is much improved after being on 30 mg of prednisone.  She has a planned trip next week going on a cruise.    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   Pre-diabetes      Family History  Problem Relation Age of Onset   Atrial fibrillation Mother    Congestive Heart Failure Mother    Depression Mother    Heart disease Mother    Stroke Mother    Thyroid disease Mother    Anxiety disorder Mother    Obesity Mother    Heart disease Father    Alcohol abuse Father    High blood pressure Father    High Cholesterol Father    Alcoholism Father    Depression Sister    Heart disease Brother    Heart disease Brother    Depression Daughter      Past Surgical History:  Procedure Laterality Date   BREAST BIOPSY Left    needle core biopsy, benign   BRONCHIAL BIOPSY  06/15/2022   Procedure: BRONCHIAL BIOPSIES;  Surgeon: Spero Geralds, MD;  Location: [facility] ENDOSCOPY;  Service: Pulmonary;;   BRONCHIAL BIOPSY  06/29/2022   Procedure: BRONCHIAL BIOPSIES;  Surgeon: Garner Nash, DO;  Location: [place] ENDOSCOPY;  Service: Pulmonary;;   BRONCHIAL NEEDLE ASPIRATION BIOPSY  06/15/2022   Procedure: BRONCHIAL NEEDLE ASPIRATION BIOPSIES;  Surgeon: Spero Geralds, MD;  Location: [facility] ENDOSCOPY;  Service: Pulmonary;;   BRONCHIAL NEEDLE ASPIRATION BIOPSY  06/29/2022   Procedure: BRONCHIAL NEEDLE ASPIRATION BIOPSIES;  Surgeon: Garner Nash, DO;  Location: [place];  Service: Pulmonary;;   BRONCHIAL WASHINGS  06/15/2022   Procedure: BRONCHIAL WASHINGS;  Surgeon: Spero Geralds, MD;  Location: [facility] ENDOSCOPY;  Service: Pulmonary;;   CATARACT EXTRACTION     DILATION AND CURETTAGE OF UTERUS     EYE SURGERY     HAND SURGERY     KNEE SURGERY     ROTATOR CUFF REPAIR     TOTAL ABDOMINAL HYSTERECTOMY     VIDEO BRONCHOSCOPY N/A  06/15/2022   Procedure: VIDEO BRONCHOSCOPY WITH FLUORO;  Surgeon: Spero Geralds, MD;  Location: [facility] ENDOSCOPY;  Service: Pulmonary;  Laterality: N/A;   VIDEO BRONCHOSCOPY WITH ENDOBRONCHIAL ULTRASOUND  06/15/2022   Procedure: VIDEO BRONCHOSCOPY WITH ENDOBRONCHIAL ULTRASOUND;  Surgeon: Spero Geralds, MD;  Location[facility] ENDOSCOPY;  Service: Pulmonary;;   VIDEO BRONCHOSCOPY WITH ENDOBRONCHIAL ULTRASOUND N/A 06/29/2022   Procedure: VIDEO BRONCHOSCOPY WITH ENDOBRONCHIAL ULTRASOUND;  Surgeon: Garner Nash, DO;  Location: [place];  Service: Pulmonary;  Laterality: N/A;    Social History   Socioeconomic History   Marital status: Married    Spouse name: John   Number of children: 1   Years of education: [employer] education level: Not on file  Occupational History   Occupation: Retired    Fish farm manager: OTHER    Comment: [employer]  Tobacco Use   Smoking status: Never   Smokeless tobacco: Never  Vaping Use   Vaping Use: Never used  Substance and Sexual Activity   Alcohol use: Yes    Comment: rare   Drug use: No   Sexual activity: Not on file  Other Topics Concern   Not on file  Social History Narrative   Patient lives at home spouse.   Caffeine use: 2 sodas weekly   Right handed    Social Determinants of Health   Financial Resource Strain: Low Risk  (10/27/2021)   Overall Financial Resource Strain (CARDIA)    Difficulty of Paying Living Expenses: Not hard at all  Food Insecurity: No Food Insecurity (10/27/2021)   Hunger Vital Sign    Worried About Running Out of Food in the Last Year: Never true    Ran Out of Food in the Last Year: Never true  Transportation Needs: No Transportation Needs (10/27/2021)   PRAPARE - Hydrologist (Medical): No    Lack of Transportation (Non-Medical): No  Physical Activity: Inactive (09/26/2018)   Exercise Vital Sign    Days of Exercise per Week: 0 days    Minutes of Exercise per Session: 0 min   Stress: No Stress Concern Present (10/27/2021)   [place]    Feeling of Stress : Only a little  Social Connections: Socially Integrated (10/27/2021)   Social Connection and Isolation Panel [NHANES]    Frequency of Communication with Friends and Family: Once a week    Frequency of Social Gatherings with Friends and Family: More than three times a week    Attends Religious Services: More than 4 times per year    Active Member of [employer] or Organizations: Yes    Attends Archivist Meetings: More than 4 times per year    Marital Status: Married  Human resources officer Violence: Not At Risk (10/27/2021)  Humiliation, Afraid, Rape, and Kick questionnaire    Fear of Current or Ex-Partner: No    Emotionally Abused: No    Physically Abused: No    Sexually Abused: No     Allergies  Allergen Reactions   Dextran Anaphylaxis   Tree Extract    Penicillin G Rash   Penicillins Rash   Sulfa Antibiotics Rash     Outpatient Medications Prior to Visit  Medication Sig Dispense Refill   alendronate (FOSAMAX) 70 MG tablet TAKE 1 TABLET BY MOUTH EVERY 7 DAYS, TAKE WITH A FULL GLASS OF WATER ON AN EMPTY STOMACH (Patient taking differently: Take 70 mg by mouth every Sunday.) 12 tablet 3   ALPRAZolam (XANAX) 0.5 MG tablet Take 0.5-1 tablets (0.25-0.5 mg total) by mouth 2 (two) times daily as needed for anxiety. 20 tablet 0   amLODipine (NORVASC) 5 MG tablet Take 1 tablet (5 mg total) by mouth daily. 90 tablet 3   atenolol (TENORMIN) 50 MG tablet Take 1 tablet (50 mg total) by mouth daily. (Patient taking differently: Take 50 mg by mouth at bedtime.) 90 tablet 3   benzonatate (TESSALON) 200 MG capsule Take 1 capsule (200 mg total) by mouth 3 (three) times daily as needed for cough. 30 capsule 1   Calcium Carb-Cholecalciferol (CALCIUM 600 + D PO) Take 1 tablet by mouth in the morning and at bedtime.     flecainide (TAMBOCOR) 100 MG tablet  TAKE 1 TABLET BY MOUTH 2 TIMES DAILY 180 tablet 3   FLUoxetine (PROZAC) 40 MG capsule Take 1 capsule (40 mg total) by mouth 2 (two) times daily. 180 capsule 3   fluticasone (FLONASE) 50 MCG/ACT nasal spray SPRAY 2 SPRAYS INTO EACH NOSTRIL EVERY DAY 48 mL 1   hydrOXYzine (ATARAX) 10 MG tablet Take 1-2 tablets (10-20 mg total) by mouth 3 (three) times daily as needed for anxiety. 90 tablet 3   Krill Oil 500 MG CAPS Take 500 mg by mouth daily.     levothyroxine (SYNTHROID) 125 MCG tablet TAKE 1 TABLET BY MOUTH DAILY BEFORE BREAKFAST. 90 tablet 3   magnesium oxide (MAG-OX) 400 (240 Mg) MG tablet Take 400 mg by mouth daily.     Multiple Vitamins-Minerals (PRESERVISION AREDS 2+MULTI VIT PO) Take 1 capsule by mouth daily.     omeprazole (PRILOSEC) 40 MG capsule Take 1 capsule (40 mg total) by mouth in the morning and at bedtime. 180 capsule 0   ondansetron (ZOFRAN) 4 MG tablet Take 4 mg by mouth 3 (three) times daily as needed for nausea or vomiting.     predniSONE (DELTASONE) 10 MG tablet Take 3 tablets (30 mg total) by mouth daily with breakfast. 180 tablet 1   Propylene Glycol (SYSTANE BALANCE) 0.6 % SOLN Place 1 drop into both eyes as needed (dry eyes).     rosuvastatin (CRESTOR) 20 MG tablet TAKE 1 TABLET BY MOUTH EVERY DAY 90 tablet 3   traZODone (DESYREL) 50 MG tablet Take 1.5 tablets (75 mg total) by mouth at bedtime. (Patient taking differently: Take 50 mg by mouth at bedtime.) 135 tablet 1   XARELTO 20 MG TABS tablet TAKE 1 TABLET BY MOUTH EVERY DAY 90 tablet 3   No facility-administered medications prior to visit.    Review of Systems  Constitutional:  Negative for chills, fever, malaise/fatigue and weight loss.  HENT:  Negative for hearing loss, sore throat and tinnitus.   Eyes:  Negative for blurred vision and double vision.  Respiratory:  Positive for cough.  Negative for hemoptysis, sputum production, shortness of breath, wheezing and stridor.   Cardiovascular:  Negative for chest  pain, palpitations, orthopnea, leg swelling and PND.  Gastrointestinal:  Negative for abdominal pain, constipation, diarrhea, heartburn, nausea and vomiting.  Genitourinary:  Negative for dysuria, hematuria and urgency.  Musculoskeletal:  Negative for joint pain and myalgias.  Skin:  Negative for itching and rash.  Neurological:  Negative for dizziness, tingling, weakness and headaches.  Endo/Heme/Allergies:  Negative for environmental allergies. Does not bruise/bleed easily.  Psychiatric/Behavioral:  Negative for depression. The patient is not nervous/anxious and does not have insomnia.   All other systems reviewed and are negative.    Objective:  Physical Exam Vitals reviewed.  Constitutional:      General: She is not in acute distress.    Appearance: She is well-developed.  HENT:     Head: Normocephalic and atraumatic.  Eyes:     General: No scleral icterus.    Conjunctiva/sclera: Conjunctivae normal.     Pupils: Pupils are equal, round, and reactive to light.  Neck:     Vascular: No JVD.     Trachea: No tracheal deviation.  Cardiovascular:     Rate and Rhythm: Normal rate and regular rhythm.     Heart sounds: Normal heart sounds. No murmur heard. Pulmonary:     Effort: Pulmonary effort is normal. No tachypnea, accessory muscle usage or respiratory distress.     Breath sounds: No stridor. No wheezing, rhonchi or rales.  Abdominal:     General: Bowel sounds are normal. There is no distension.     Palpations: Abdomen is soft.     Tenderness: There is no abdominal tenderness.  Musculoskeletal:        General: No tenderness.     Cervical back: Neck supple.  Lymphadenopathy:     Cervical: No cervical adenopathy.  Skin:    General: Skin is warm and dry.     Capillary Refill: Capillary refill takes less than 2 seconds.     Findings: No rash.  Neurological:     Mental Status: She is alert and oriented to person, place, and time.  Psychiatric:        Behavior: Behavior  normal.      Vitals:   08/14/22 1404  BP: 124/78  Pulse: (!) 58  Temp: 98.4 F (36.9 C)  TempSrc: Oral  SpO2: 96%  Weight: 166 lb 3.2 oz (75.4 kg)  Height: '5\' 4"'$  (1.626 m)   96% on RA BMI Readings from Last 3 Encounters:  08/14/22 28.53 kg/m  07/24/22 30.72 kg/m  07/05/22 30.04 kg/m   Wt Readings from Last 3 Encounters:  08/14/22 166 lb 3.2 oz (75.4 kg)  07/24/22 162 lb 9.6 oz (73.8 kg)  07/05/22 159 lb (72.1 kg)     CBC    Component Value Date/Time   WBC 6.7 06/15/2022 0935   RBC 4.43 06/15/2022 0935   HGB 13.1 06/15/2022 0935   HCT 40.1 06/15/2022 0935   PLT 239 06/15/2022 0935   MCV 90.5 06/15/2022 0935   MCH 29.6 06/15/2022 0935   MCHC 32.7 06/15/2022 0935   RDW 12.5 06/15/2022 0935   LYMPHSABS 1.3 09/24/2019 1224   MONOABS 0.6 09/24/2019 1224   EOSABS 0.1 09/24/2019 1224   BASOSABS 0.1 09/24/2019 1224     Chest Imaging: October 2023 nuclear medicine pet imaging: Hypermetabolic lesion within the left upper lobe with associated adenopathy in the chest. The patient's images have been independently reviewed by me.  Pulmonary Functions Testing Results:    Latest Ref Rng & Units 08/29/2016    4:08 PM  PFT Results  FVC-Pre L 2.57   FVC-Predicted Pre % 93   FVC-Post L 2.63   FVC-Predicted Post % 95   Pre FEV1/FVC % % 83   Post FEV1/FCV % % 84   FEV1-Pre L 2.13   FEV1-Predicted Pre % 101   FEV1-Post L 2.21   DLCO uncorrected ml/min/mmHg 15.17   DLCO UNC% % 75   DLVA Predicted % 86   TLC L 4.20   TLC % Predicted % 91   RV % Predicted % 73     FeNO:   Pathology:   Echocardiogram:   Heart Catheterization:     Assessment & Plan:     ICD-10-CM   1. Sarcoidosis  D86.9     2. Nocturnal hypoxemia  G47.34 Pulse oximetry, overnight      Discussion:  Pulmonary sarcoidosis.  Currently under treatment with 0.5 mg/kg, 30 mg of prednisone daily.  Plan: Reviewed echocardiogram results with patient today in the office. Establish care  for follow-up appointment to see Dr. Rodman Pickle in the next 4 to 6 weeks. She would likely need PFTs in the future. Continue annual eye exam.  She already has an ophthalmologist that she follows with annually. Will discuss treatment duration of prednisone at next appointment.  And or consideration for transition off of prednisone to a steroid sparing agent.     Current Outpatient Medications:    alendronate (FOSAMAX) 70 MG tablet, TAKE 1 TABLET BY MOUTH EVERY 7 DAYS, TAKE WITH A FULL GLASS OF WATER ON AN EMPTY STOMACH (Patient taking differently: Take 70 mg by mouth every Sunday.), Disp: 12 tablet, Rfl: 3   ALPRAZolam (XANAX) 0.5 MG tablet, Take 0.5-1 tablets (0.25-0.5 mg total) by mouth 2 (two) times daily as needed for anxiety., Disp: 20 tablet, Rfl: 0   amLODipine (NORVASC) 5 MG tablet, Take 1 tablet (5 mg total) by mouth daily., Disp: 90 tablet, Rfl: 3   atenolol (TENORMIN) 50 MG tablet, Take 1 tablet (50 mg total) by mouth daily. (Patient taking differently: Take 50 mg by mouth at bedtime.), Disp: 90 tablet, Rfl: 3   benzonatate (TESSALON) 200 MG capsule, Take 1 capsule (200 mg total) by mouth 3 (three) times daily as needed for cough., Disp: 30 capsule, Rfl: 1   Calcium Carb-Cholecalciferol (CALCIUM 600 + D PO), Take 1 tablet by mouth in the morning and at bedtime., Disp: , Rfl:    flecainide (TAMBOCOR) 100 MG tablet, TAKE 1 TABLET BY MOUTH 2 TIMES DAILY, Disp: 180 tablet, Rfl: 3   FLUoxetine (PROZAC) 40 MG capsule, Take 1 capsule (40 mg total) by mouth 2 (two) times daily., Disp: 180 capsule, Rfl: 3   fluticasone (FLONASE) 50 MCG/ACT nasal spray, SPRAY 2 SPRAYS INTO EACH NOSTRIL EVERY DAY, Disp: 48 mL, Rfl: 1   hydrOXYzine (ATARAX) 10 MG tablet, Take 1-2 tablets (10-20 mg total) by mouth 3 (three) times daily as needed for anxiety., Disp: 90 tablet, Rfl: 3   Krill Oil 500 MG CAPS, Take 500 mg by mouth daily., Disp: , Rfl:    levothyroxine (SYNTHROID) 125 MCG tablet, TAKE 1 TABLET BY  MOUTH DAILY BEFORE BREAKFAST., Disp: 90 tablet, Rfl: 3   magnesium oxide (MAG-OX) 400 (240 Mg) MG tablet, Take 400 mg by mouth daily., Disp: , Rfl:    Multiple Vitamins-Minerals (PRESERVISION AREDS 2+MULTI VIT PO), Take 1 capsule by mouth daily., Disp: , Rfl:  omeprazole (PRILOSEC) 40 MG capsule, Take 1 capsule (40 mg total) by mouth in the morning and at bedtime., Disp: 180 capsule, Rfl: 0   ondansetron (ZOFRAN) 4 MG tablet, Take 4 mg by mouth 3 (three) times daily as needed for nausea or vomiting., Disp: , Rfl:    predniSONE (DELTASONE) 10 MG tablet, Take 3 tablets (30 mg total) by mouth daily with breakfast., Disp: 180 tablet, Rfl: 1   Propylene Glycol (SYSTANE BALANCE) 0.6 % SOLN, Place 1 drop into both eyes as needed (dry eyes)., Disp: , Rfl:    rosuvastatin (CRESTOR) 20 MG tablet, TAKE 1 TABLET BY MOUTH EVERY DAY, Disp: 90 tablet, Rfl: 3   traZODone (DESYREL) 50 MG tablet, Take 1.5 tablets (75 mg total) by mouth at bedtime. (Patient taking differently: Take 50 mg by mouth at bedtime.), Disp: 135 tablet, Rfl: 1   XARELTO 20 MG TABS tablet, TAKE 1 TABLET BY MOUTH EVERY DAY, Disp: 90 tablet, Rfl: 3    Garner Nash, DO [facility] 08/14/2022 2:31 PM

## 2022-08-14 NOTE — Patient Instructions (Signed)
Thank you for visiting Dr. Valeta Harms at [facility]. Today we recommend the following:  Overnight pulse ox on room air  Continue '30mg'$  prednisone   Return in about 6 weeks (around 09/25/2022) for w/ Dr. Loanne Drilling, new consult sarcoidosis.    Please do your part to reduce the spread of COVID-19.

## 2022-08-15 ENCOUNTER — Encounter: Payer: Self-pay | Admitting: Nurse Practitioner

## 2022-08-15 ENCOUNTER — Telehealth: Payer: Self-pay | Admitting: Pulmonary Disease

## 2022-08-15 ENCOUNTER — Ambulatory Visit (INDEPENDENT_AMBULATORY_CARE_PROVIDER_SITE_OTHER): Payer: Medicare Other | Admitting: Nurse Practitioner

## 2022-08-15 VITALS — BP 104/68 | HR 56 | Temp 98.3°F | Ht 64.0 in | Wt 162.0 lb

## 2022-08-15 DIAGNOSIS — Z6830 Body mass index (BMI) 30.0-30.9, adult: Secondary | ICD-10-CM

## 2022-08-15 DIAGNOSIS — Z6835 Body mass index (BMI) 35.0-35.9, adult: Secondary | ICD-10-CM

## 2022-08-15 DIAGNOSIS — I10 Essential (primary) hypertension: Secondary | ICD-10-CM | POA: Diagnosis not present

## 2022-08-15 DIAGNOSIS — E669 Obesity, unspecified: Secondary | ICD-10-CM | POA: Diagnosis not present

## 2022-08-16 ENCOUNTER — Encounter: Payer: Self-pay | Admitting: Family Medicine

## 2022-08-16 DIAGNOSIS — Z1231 Encounter for screening mammogram for malignant neoplasm of breast: Secondary | ICD-10-CM

## 2022-08-16 DIAGNOSIS — I480 Paroxysmal atrial fibrillation: Secondary | ICD-10-CM

## 2022-08-16 NOTE — Telephone Encounter (Signed)
Order for the ONO was sent to [employer].  [employer] will reach out to pt for her to get the device to do the ONO. Attempted to call pt to go over this info with her but unable to reach. Left message for her to return call.

## 2022-08-17 DIAGNOSIS — G4730 Sleep apnea, unspecified: Secondary | ICD-10-CM | POA: Diagnosis not present

## 2022-08-17 DIAGNOSIS — R0683 Snoring: Secondary | ICD-10-CM | POA: Diagnosis not present

## 2022-08-17 MED ORDER — FLECAINIDE ACETATE 100 MG PO TABS: 100.0000 mg | ORAL_TABLET | Freq: Two times a day (BID) | ORAL | 3 refills | Status: DC

## 2022-08-17 NOTE — Telephone Encounter (Signed)
Called and spoke with pt who states she received a call from [employer] stating that they have received ONO device and she will be doing the ONO tonight. Stated to her that we would call her once we had the report from the ONO to go over the results and she verbalized understanding. Nothing further needed.

## 2022-08-18 ENCOUNTER — Other Ambulatory Visit: Payer: Self-pay

## 2022-08-18 ENCOUNTER — Telehealth: Payer: Self-pay | Admitting: Pulmonary Disease

## 2022-08-18 NOTE — Telephone Encounter (Signed)
Called patient back this evening and told her that I will message Dr Valeta Harms and get the ONO results faxed to [place] if he has not seen them. I advised her that as soon as we get the report for the ono someone will call her with an update in regards to her oxygen.   Please advise sir.   Called to get you the results for the ONO if you have not already seen them

## 2022-08-18 NOTE — Progress Notes (Signed)
The proposed treatment discussed in conference is for discussion purpose only and is not a binding recommendation.  The patients have not been physically examined, or presented with their treatment options.  Therefore, final treatment plans cannot be decided.  

## 2022-08-21 ENCOUNTER — Ambulatory Visit: Payer: Medicare Other | Admitting: Pulmonary Disease

## 2022-08-31 ENCOUNTER — Telehealth: Payer: Self-pay | Admitting: Pulmonary Disease

## 2022-08-31 ENCOUNTER — Encounter: Payer: Self-pay | Admitting: Pulmonary Disease

## 2022-08-31 NOTE — Progress Notes (Signed)
Chief Complaint:   OBESITY Lisa Acosta is here to discuss her progress with her obesity treatment plan along with follow-up of her obesity related diagnoses. Lisa Acosta is on the Category 1 Plan and states she is following her eating plan approximately 0% of the time. Lisa Acosta states she is exercising 0 minutes 0 times per week.  Today's visit was #: 77 Starting weight: 198 lbs Starting date: 01/02/2021 Today's weight: 162 lbs Today's date: 08/15/2022 Total lbs lost to date: 36 lbs Total lbs lost since last in-office visit: 0  Interim History: Lisa Acosta was seen here last on 05/01/22. Has been on Prednisone for the past 6 weeks and will be on it 3 more months. Struggling with hunger since starting Prednisone. Seeing Pulmonary for abnormal scans and follow up. Going on a cruise next week.  Subjective:   1. Essential hypertension Antoine's blood pressure looks good today. Saw cardiology recently. Last Echo 07/14/22.  Assessment/Plan:   1. Essential hypertension Continue to follow up with cardio. Continue medications as directed.  Lisa Acosta is working on healthy weight loss and exercise to improve blood pressure control. We will watch for signs of hypotension as she continues her lifestyle modifications.   2. Obesity with current BMI of 30.6 [name] is currently in the action stage of change. As such, her goal is to continue with weight loss efforts. She has agreed to practicing portion control and making smarter food choices, such as increasing vegetables and decreasing simple carbohydrates.   Exercise goals: Older adults should follow the adult guidelines. When older adults cannot meet the adult guidelines, they should be as physically active as their abilities and conditions will allow.   Lisa Acosta is to discuss plan of care with Pulmonary. Recheck IC at next visit.  Behavioral modification strategies: increasing lean protein intake, increasing water intake, and holiday eating strategies .  Lisa Acosta has  agreed to follow-up with our clinic in 3 weeks. She was informed of the importance of frequent follow-up visits to maximize her success with intensive lifestyle modifications for her multiple health conditions.   Objective:   Blood pressure 104/68, pulse (!) 56, temperature 98.3 F (36.8 C), temperature source Oral, height '5\' 4"'$  (1.626 m), weight 162 lb (73.5 kg), SpO2 92 %. Body mass index is 27.81 kg/m.  General: Cooperative, alert, well developed, in no acute distress. HEENT: Conjunctivae and lids unremarkable. Cardiovascular: Regular rhythm.  Lungs: Normal work of breathing. Neurologic: No focal deficits.   Lab Results  Component Value Date   CREATININE 0.66 06/15/2022   BUN 15 06/15/2022   NA 137 06/15/2022   K 3.9 06/15/2022   CL 107 06/15/2022   CO2 23 06/15/2022   Lab Results  Component Value Date   ALT 34 03/21/2022   AST 36 03/21/2022   ALKPHOS 74 03/21/2022   BILITOT 0.9 03/21/2022   Lab Results  Component Value Date   HGBA1C 5.6 11/16/2021   HGBA1C 5.7 06/30/2021   HGBA1C 6.2 (H) 01/05/2021   HGBA1C 5.9 06/23/2019   HGBA1C 5.8 04/15/2018   Lab Results  Component Value Date   INSULIN 20.4 01/05/2021   Lab Results  Component Value Date   TSH 0.86 11/16/2021   Lab Results  Component Value Date   CHOL 180 11/16/2021   HDL 65.10 11/16/2021   LDLCALC 93 11/16/2021   LDLDIRECT 112.0 01/22/2017   TRIG 108.0 11/16/2021   CHOLHDL 3 11/16/2021   Lab Results  Component Value Date   VD25OH 79.10 11/16/2021   VD25OH  25.0 (L) 01/05/2021   Lab Results  Component Value Date   WBC 6.7 06/15/2022   HGB 13.1 06/15/2022   HCT 40.1 06/15/2022   MCV 90.5 06/15/2022   PLT 239 06/15/2022   No results found for: "IRON", "TIBC", "FERRITIN"  Attestation Statements:   Reviewed by clinician on day of visit: allergies, medications, problem list, medical history, surgical history, family history, social history, and previous encounter notes.  Time spent on visit  including pre-visit chart review and post-visit care and charting was 30 minutes.   I, Brendell Tyus, RMA, am acting as transcriptionist for Everardo Pacific, FNP.  I have reviewed the above documentation for accuracy and completeness, and I agree with the above. Everardo Pacific, FNP

## 2022-08-31 NOTE — Telephone Encounter (Signed)
Called patient and she is wanting to know if any tests or scans need to be done before she is to see Dr Loanne Drilling. She was also asking about the home sleep test but I looked at chart and she is referring to the ONO that she did. She states she is wanting to know those results if possible.   Please advise Dr Valeta Harms

## 2022-08-31 NOTE — Telephone Encounter (Signed)
Also wonders about Testing Dr. Valeta Harms did (Home Sleep Study) and what the results of that were and if she will need )@ therapy. TY

## 2022-08-31 NOTE — Telephone Encounter (Signed)
PT states Dr. Valeta Harms referred her to Dr. Loanne Drilling and she has appt but she wonders if a scan/Xray  was to be done before the appt.  [phone] is her CB

## 2022-09-01 ENCOUNTER — Telehealth: Payer: Self-pay

## 2022-09-01 NOTE — Telephone Encounter (Signed)
Called [employer] and had them email me pt ONO results. I had DOD review pt's ONO and interpret results to me. Called pt and had to leave massage. Interpreted results below. Will send Dr Avanell Shackleton results at [facility] before pt's appt. Nothing further needed.   Collene Gobble, MD1 minute ago (4:10 PM)    I reviewed the patient's overnight oximetry done on room air 08/10/2022 shows characteristic desaturations consistent with intermittent obstruction, 1 hour and 7 minutes spent below 88% with a total sleep time of almost 7 hours. She is currently on treatment dose prednisone, may merit either nocturnal oxygen or sleep study.  She needs to discuss this when she follows up with Dr. Loanne Drilling on 09/27/2022

## 2022-09-01 NOTE — Telephone Encounter (Signed)
I reviewed the patient's overnight oximetry done on room air 07/17/2022 shows characteristic desaturations consistent with intermittent obstruction, 1 hour and 7 minutes spent below 88% with a total sleep time of almost 7 hours. She is currently on treatment dose prednisone, may merit either nocturnal oxygen or sleep study.  She needs to discuss this when she follows up with Dr. Loanne Drilling on 09/27/2022

## 2022-09-05 ENCOUNTER — Telehealth: Payer: Self-pay | Admitting: Pulmonary Disease

## 2022-09-05 NOTE — Telephone Encounter (Signed)
Lisa Gobble, MD      I reviewed the patient's overnight oximetry done on room air 08/10/2022 shows characteristic desaturations consistent with intermittent obstruction, 1 hour and 7 minutes spent below 88% with a total sleep time of almost 7 hours. She is currently on treatment dose prednisone, may merit either nocturnal oxygen or sleep study.  She needs to discuss this when she follows up with Dr. Loanne Drilling on 09/27/2022      Called and spoke with pt letting her know this info and she verbalized understanding. Nothing further needed.

## 2022-09-06 ENCOUNTER — Ambulatory Visit (INDEPENDENT_AMBULATORY_CARE_PROVIDER_SITE_OTHER): Payer: Medicare Other | Admitting: Nurse Practitioner

## 2022-09-06 ENCOUNTER — Encounter: Payer: Self-pay | Admitting: Nurse Practitioner

## 2022-09-06 VITALS — BP 122/67 | HR 50 | Temp 98.4°F | Ht 64.0 in | Wt 166.0 lb

## 2022-09-06 DIAGNOSIS — E669 Obesity, unspecified: Secondary | ICD-10-CM

## 2022-09-06 DIAGNOSIS — Z6828 Body mass index (BMI) 28.0-28.9, adult: Secondary | ICD-10-CM | POA: Diagnosis not present

## 2022-09-06 DIAGNOSIS — R0602 Shortness of breath: Secondary | ICD-10-CM

## 2022-09-18 ENCOUNTER — Telehealth: Payer: Self-pay | Admitting: Pulmonary Disease

## 2022-09-18 NOTE — Telephone Encounter (Signed)
Spoke with patient she states since starting Prednisone she has experienced side effects. She states she has gained 20 pounds in a month, headaches above her rt eye for the last week, and for the past 3 days her stool has been darker. She also mentions she is urinating every hour and sometimes she doesn't always make it to the bathroom.   She is wondering should she continue this medication. She has a appointment with Dr. Loanne Drilling on 09/27/22.  Please advise Dr. Loanne Drilling, I'm sending this to you since Dr. Valeta Harms is unavailable and her next appointment is with you

## 2022-09-18 NOTE — Telephone Encounter (Signed)
Spoke with pt and reviewed advise provided by Dr. Loanne Drilling. Pt stated understanding and that she currently had enough Prednisone if needed to last until scheduled OV. Nothing further needed at this time.

## 2022-09-18 NOTE — Telephone Encounter (Signed)
She is currently Dr. Juline Patch patient and I have not yet established care with her.   Based on her symptoms, I would recommend holding prednisone until she can be seen by me. If her symptoms of cough return and are severe, we can consider restarting prednisone at 20 mg daily instead of 30 mg daily.  Please order 10 day supply if patient is agreeable.

## 2022-09-18 NOTE — Telephone Encounter (Signed)
PT has an appt coming up w/Dr. Loanne Drilling and has been on Pred. Says she is having issues w/it and seeks top be weaned off of it. Please call to advise.   Headaches Black Stool Wt Gain  Pharm: [facility] on [address]

## 2022-09-19 ENCOUNTER — Encounter (HOSPITAL_BASED_OUTPATIENT_CLINIC_OR_DEPARTMENT_OTHER): Payer: Self-pay

## 2022-09-19 ENCOUNTER — Ambulatory Visit (HOSPITAL_BASED_OUTPATIENT_CLINIC_OR_DEPARTMENT_OTHER)
Admission: RE | Admit: 2022-09-19 | Discharge: 2022-09-19 | Disposition: A | Discharge status: Home / Self Care | Payer: Medicare Other | Source: Ambulatory Visit | Attending: Family Medicine | Admitting: Family Medicine

## 2022-09-19 DIAGNOSIS — Z1231 Encounter for screening mammogram for malignant neoplasm of breast: Secondary | ICD-10-CM | POA: Diagnosis not present

## 2022-09-19 NOTE — Progress Notes (Signed)
Chief Complaint:   OBESITY Lisa Acosta is here to discuss her progress with her obesity treatment plan along with follow-up of her obesity related diagnoses. Lisa Acosta is on practicing portion control and making smarter food choices, such as increasing vegetables and decreasing simple carbohydrates and states she is following her eating plan approximately 10% of the time. Lisa Acosta states she is exercising 0 minutes 0 times per week.  Today's visit was #: 18 Starting weight: 198 lbs Starting date: 01/02/2021 Today's weight: 166 lbs Today's date: 09/06/2022 Total lbs lost to date: 32 lbs Total lbs lost since last in-office visit: 0  Interim History: Lisa Acosta has been on a cruise and celebrated the holidays since her last visit.  Struggling with hunger and cravings, since starting Prednisone.  Plans to get back on track in the new year.  Subjective:   1. SOBOE (shortness of breath on exertion) Lisa Acosta's IC on 03/01/2022 of 1411.  Today"s  IC of 1382.  Assessment/Plan:   1. SOBOE (shortness of breath on exertion) IC reviewed with patient.  Worsening.  Increase water, protein and movement.  2. Obesity with current BMI of 28.6 Lisa Acosta is currently in the action stage of change. As such, her goal is to continue with weight loss efforts. She has agreed to the Category 1 Plan.   Lisa Acosta will consider Topamax 25 mg for cravings. She wants to discuss with cardiology on 09/18/21.  Exercise goals: Older adults should follow the adult guidelines. When older adults cannot meet the adult guidelines, they should be as physically active as their abilities and conditions will allow.   Behavioral modification strategies: increasing lean protein intake, increasing water intake, and meal planning and cooking strategies.  Lisa Acosta has agreed to follow-up with our clinic in 4 weeks. She was informed of the importance of frequent follow-up visits to maximize her success with intensive lifestyle modifications for her multiple  health conditions.   Objective:   Blood pressure 122/67, pulse (!) 50, temperature 98.4 F (36.9 C), height '5\' 4"'$  (1.626 m), weight 166 lb (75.3 kg), SpO2 93 %. Body mass index is 28.49 kg/m.  General: Cooperative, alert, well developed, in no acute distress. HEENT: Conjunctivae and lids unremarkable. Cardiovascular: Regular rhythm.  Lungs: Normal work of breathing. Neurologic: No focal deficits.   Lab Results  Component Value Date   CREATININE 0.66 06/15/2022   BUN 15 06/15/2022   NA 137 06/15/2022   K 3.9 06/15/2022   CL 107 06/15/2022   CO2 23 06/15/2022   Lab Results  Component Value Date   ALT 34 03/21/2022   AST 36 03/21/2022   ALKPHOS 74 03/21/2022   BILITOT 0.9 03/21/2022   Lab Results  Component Value Date   HGBA1C 5.6 11/16/2021   HGBA1C 5.7 06/30/2021   HGBA1C 6.2 (H) 01/05/2021   HGBA1C 5.9 06/23/2019   HGBA1C 5.8 04/15/2018   Lab Results  Component Value Date   INSULIN 20.4 01/05/2021   Lab Results  Component Value Date   TSH 0.86 11/16/2021   Lab Results  Component Value Date   CHOL 180 11/16/2021   HDL 65.10 11/16/2021   LDLCALC 93 11/16/2021   LDLDIRECT 112.0 01/22/2017   TRIG 108.0 11/16/2021   CHOLHDL 3 11/16/2021   Lab Results  Component Value Date   VD25OH 79.10 11/16/2021   VD25OH 25.0 (L) 01/05/2021   Lab Results  Component Value Date   WBC 6.7 06/15/2022   HGB 13.1 06/15/2022   HCT 40.1 06/15/2022   MCV  90.5 06/15/2022   PLT 239 06/15/2022   No results found for: "IRON", "TIBC", "FERRITIN"  Attestation Statements:   Reviewed by clinician on day of visit: allergies, medications, problem list, medical history, surgical history, family history, social history, and previous encounter notes.  I, Brendell Tyus, RMA, am acting as transcriptionist for Everardo Pacific, FNP.  I have reviewed the above documentation for accuracy and completeness, and I agree with the above. Everardo Pacific, FNP

## 2022-09-25 ENCOUNTER — Telehealth: Payer: Self-pay | Admitting: Pulmonary Disease

## 2022-09-25 NOTE — Telephone Encounter (Signed)
Pt's appt has been changed to 10:00. Attempted to call pt but unable to reach. Left pt a detailed message letting her know that we needed her to come to her appt at 10am instead of 10:15 but unable to reach. Left pt a detailed message letting her know of this information.

## 2022-09-25 NOTE — Telephone Encounter (Signed)
I would like to evaluate Lisa Acosta first and we can plan to order CT scan during the visit if indicated. I may want to titrate her steroids first before planning for scan.

## 2022-09-25 NOTE — Telephone Encounter (Signed)
Spoke with patient advised of Dr. Cordelia Pen recommendation. I know this slot is for 15 minutes. This was the only day she was available. If this wont work for you I can change the appointment. Please advise

## 2022-09-25 NOTE — Telephone Encounter (Signed)
Dr. Loanne Drilling patient is supposed to be seeing you for a new patient appointment. She is asking does she need to have a CT scan done prior to her visit with you. She states she has been on Prednisone for several weeks and has been curious to see if it will show on the scan.   Can you please advise?

## 2022-09-25 NOTE — Telephone Encounter (Signed)
I have a blocked spot at 10 am. OK to change her appt to 10-10:30 AM so we can have a full 30 minutes together. Please advise patient to come earlier.

## 2022-09-27 ENCOUNTER — Ambulatory Visit (HOSPITAL_BASED_OUTPATIENT_CLINIC_OR_DEPARTMENT_OTHER): Payer: Medicare Other | Admitting: Pulmonary Disease

## 2022-10-03 ENCOUNTER — Ambulatory Visit: Payer: Medicare Other | Admitting: Nurse Practitioner

## 2022-10-05 ENCOUNTER — Ambulatory Visit (INDEPENDENT_AMBULATORY_CARE_PROVIDER_SITE_OTHER): Payer: Medicare Other | Admitting: Pulmonary Disease

## 2022-10-05 ENCOUNTER — Telehealth: Payer: Self-pay | Admitting: Pulmonary Disease

## 2022-10-05 ENCOUNTER — Encounter (HOSPITAL_BASED_OUTPATIENT_CLINIC_OR_DEPARTMENT_OTHER): Payer: Self-pay | Admitting: Pulmonary Disease

## 2022-10-05 ENCOUNTER — Other Ambulatory Visit (HOSPITAL_BASED_OUTPATIENT_CLINIC_OR_DEPARTMENT_OTHER): Payer: Self-pay

## 2022-10-05 VITALS — BP 130/72 | HR 58 | Ht 61.0 in | Wt 174.2 lb

## 2022-10-05 DIAGNOSIS — D869 Sarcoidosis, unspecified: Secondary | ICD-10-CM

## 2022-10-05 DIAGNOSIS — Z7189 Other specified counseling: Secondary | ICD-10-CM

## 2022-10-05 DIAGNOSIS — Z79899 Other long term (current) drug therapy: Secondary | ICD-10-CM

## 2022-10-05 DIAGNOSIS — Z111 Encounter for screening for respiratory tuberculosis: Secondary | ICD-10-CM

## 2022-10-05 LAB — PULMONARY FUNCTION TEST
DL/VA % pred: 81 %
DL/VA: 3.45 ml/min/mmHg/L
DLCO cor % pred: 65 %
DLCO cor: 11.46 ml/min/mmHg
DLCO unc % pred: 65 %
DLCO unc: 11.46 ml/min/mmHg
FEF 25-75 Post: 3.07 L/sec
FEF 25-75 Pre: 2.85 L/sec
FEF2575-%Change-Post: 7 %
FEF2575-%Pred-Post: 187 %
FEF2575-%Pred-Pre: 174 %
FEV1-%Change-Post: -2 %
FEV1-%Pred-Post: 104 %
FEV1-%Pred-Pre: 106 %
FEV1-Post: 2.02 L
FEV1-Pre: 2.06 L
FEV1FVC-%Change-Post: -3 %
FEV1FVC-%Pred-Pre: 117 %
FEV6-%Change-Post: 3 %
FEV6-%Pred-Post: 95 %
FEV6-%Pred-Pre: 92 %
FEV6-Post: 2.34 L
FEV6-Pre: 2.26 L
FEV6FVC-%Change-Post: 1 %
FEV6FVC-%Pred-Post: 103 %
FEV6FVC-%Pred-Pre: 102 %
FVC-%Change-Post: 1 %
FVC-%Pred-Post: 92 %
FVC-%Pred-Pre: 90 %
FVC-Post: 2.37 L
FVC-Pre: 2.33 L
Post FEV1/FVC ratio: 85 %
Post FEV6/FVC ratio: 99 %
Pre FEV1/FVC ratio: 88 %
Pre FEV6/FVC Ratio: 97 %
RV % pred: 78 %
RV: 1.63 L
TLC % pred: 87 %
TLC: 4.04 L

## 2022-10-05 MED ORDER — BENZONATATE 200 MG PO CAPS: 200.0000 mg | ORAL_CAPSULE | Freq: Three times a day (TID) | ORAL | 1 refills | Status: DC | PRN

## 2022-10-05 NOTE — Patient Instructions (Signed)
Full PFT Performed Today  

## 2022-10-05 NOTE — Progress Notes (Signed)
Full PFT Performed Today  

## 2022-10-05 NOTE — Patient Instructions (Addendum)
Sarcoid --START methotrexate and folic acid for goal 20 mg weekly --START prednisone 10 mg daily. We will wean off once started on methotrexate --Labs ordered  Chronic cough --ORDER pulmonary function test for today --ORDER tessalon perls three times a day as needed  Follow-up with me in 3 months

## 2022-10-05 NOTE — Progress Notes (Signed)
Subjective:   PATIENT ID: Lisa Acosta GENDER: female DOB: 1951/05/31, MRN: [id number]  Chief Complaint  Patient presents with   New Patient (Initial Visit)    Cough is back having dizziness and fatigue not sure if it's sarcoid or lung mass    Reason for Visit: New patient to me  [name] is a 72 year old female never smoker with pulmonary sarcoid GERD, HTN, HLD, atrial fibrillation who presents for follow-up.  She was previously seen by Dr. Valeta Harms for pulmonary sarcoid and started on steroids, prednisone 30 mg daily. Since starting this she has had 20 lb weight gain and headaches, irritiability and darker stool per 09/18/22 telephone note. She was advised to hold. Her cough has returned and is improved but persistent. Cough had initially started six months ago.  She reports significant fatigue and prior sleep studies have been negative. She is wearing oxygen nightly.  Social History: Never smoker. Exposed to smoke  I have personally reviewed patient's past medical/family/social history, allergies, current medications.  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   Pre-diabetes      Family History  Problem Relation Age of Onset   Atrial fibrillation Mother    Congestive Heart Failure Mother    Depression Mother    Heart disease Mother    Stroke Mother    Thyroid disease Mother    Anxiety disorder Mother    Obesity Mother     Heart disease Father    Alcohol abuse Father    High blood pressure Father    High Cholesterol Father    Alcoholism Father    Depression Sister    Heart disease Brother    Heart disease Brother    Depression Daughter      Social History   Occupational History   Occupation: Retired    Fish farm manager: OTHER    Comment: Radiographer, therapeutic  Tobacco Use   Smoking status: Never    Passive exposure: Current (doesn't smoke in house now or past 82yr)   Smokeless tobacco: Never  Vaping Use   Vaping Use: Never used  Substance and Sexual Activity   Alcohol use: Yes    Comment: rare   Drug use: No   Sexual activity: Not on file    Allergies  Allergen Reactions   Dextran Anaphylaxis   Tree Extract    Penicillin G Rash   Penicillins Rash   Sulfa Antibiotics Rash     Outpatient Medications Prior to Visit  Medication Sig Dispense Refill   alendronate (FOSAMAX) 70 MG tablet TAKE 1 TABLET BY MOUTH EVERY 7 DAYS, TAKE WITH A FULL GLASS OF WATER ON AN EMPTY STOMACH (Patient taking differently: Take 70 mg by mouth every Sunday.) 12 tablet 3   amLODipine (NORVASC) 5 MG tablet Take 1 tablet (5 mg total) by  mouth daily. 90 tablet 3   atenolol (TENORMIN) 50 MG tablet Take 1 tablet (50 mg total) by mouth daily. (Patient taking differently: Take 50 mg by mouth at bedtime.) 90 tablet 3   Calcium Carb-Cholecalciferol (CALCIUM 600 + D PO) Take 1 tablet by mouth in the morning and at bedtime.     flecainide (TAMBOCOR) 100 MG tablet Take 1 tablet (100 mg total) by mouth 2 (two) times daily. 180 tablet 3   FLUoxetine (PROZAC) 40 MG capsule Take 1 capsule (40 mg total) by mouth 2 (two) times daily. 180 capsule 3   Krill Oil 500 MG CAPS Take 500 mg by mouth daily.     levothyroxine (SYNTHROID) 125 MCG tablet TAKE 1 TABLET BY MOUTH DAILY BEFORE BREAKFAST. 90 tablet 3   magnesium oxide (MAG-OX) 400 (240 Mg) MG tablet Take 400 mg by mouth daily.     Multiple Vitamins-Minerals (PRESERVISION AREDS 2+MULTI VIT  PO) Take 1 capsule by mouth daily.     omeprazole (PRILOSEC) 40 MG capsule Take 1 capsule (40 mg total) by mouth in the morning and at bedtime. 180 capsule 0   Propylene Glycol (SYSTANE BALANCE) 0.6 % SOLN Place 1 drop into both eyes as needed (dry eyes).     rosuvastatin (CRESTOR) 20 MG tablet TAKE 1 TABLET BY MOUTH EVERY DAY 90 tablet 3   traZODone (DESYREL) 50 MG tablet Take 1.5 tablets (75 mg total) by mouth at bedtime. (Patient taking differently: Take 50 mg by mouth at bedtime.) 135 tablet 1   XARELTO 20 MG TABS tablet TAKE 1 TABLET BY MOUTH EVERY DAY 90 tablet 3   ALPRAZolam (XANAX) 0.5 MG tablet Take 0.5-1 tablets (0.25-0.5 mg total) by mouth 2 (two) times daily as needed for anxiety. (Patient not taking: Reported on 10/05/2022) 20 tablet 0   benzonatate (TESSALON) 200 MG capsule Take 1 capsule (200 mg total) by mouth 3 (three) times daily as needed for cough. (Patient not taking: Reported on 10/05/2022) 30 capsule 1   fluticasone (FLONASE) 50 MCG/ACT nasal spray SPRAY 2 SPRAYS INTO EACH NOSTRIL EVERY DAY 48 mL 1   hydrOXYzine (ATARAX) 10 MG tablet Take 1-2 tablets (10-20 mg total) by mouth 3 (three) times daily as needed for anxiety. 90 tablet 3   ondansetron (ZOFRAN) 4 MG tablet Take 4 mg by mouth 3 (three) times daily as needed for nausea or vomiting. (Patient not taking: Reported on 10/05/2022)     No facility-administered medications prior to visit.    Review of Systems  Constitutional:  Negative for chills, diaphoresis, fever, malaise/fatigue and weight loss.  HENT:  Negative for congestion.   Respiratory:  Positive for cough. Negative for hemoptysis, sputum production, shortness of breath and wheezing.   Cardiovascular:  Negative for chest pain, palpitations and leg swelling.     Objective:   Vitals:   10/05/22 1023  BP: 130/72  Pulse: (!) 58  SpO2: 98%  Weight: 174 lb 3.2 oz (79 kg)  Height: '5\' 1"'$  (1.549 m)   SpO2: 98 % O2 Device: None (Room air)  Physical  Exam: General: Well-appearing, no acute distress HENT: [place], AT Eyes: EOMI, no scleral icterus Respiratory: Clear to auscultation bilaterally.  No crackles, wheezing or rales Cardiovascular: RRR, -M/R/G, no JVD Extremities:-Edema,-tenderness Neuro: AAO x4, CNII-XII grossly intact Psych: Normal mood, normal affect  Data Reviewed:  Imaging: PET 6/78/93 - Hypermetabolic masslike consolidation in LUL, hypermetabolic left supraclavicular, hilar and mediastinal adenopathy. RUL nodule 12 mm new CT Super D 06/26/22 - LUL  lung mass with GGO. RUL nodule resolved CT Chest 08/14/22 - Stable LUL focal opacity with surrounding GGO, new reticular opacities in lung bases, stable left supraclavicular, left hilar and prevascular lymph nodes  PFT: 08/29/16  FVC 2.63 (95%) FEV1 2.21 (105%) Ratio 84  TLC 91% DLCO 75% Interpretation: Normal spirometry and lung volumes. Mildly reduced DLCO.  10/05/22 FVC 2.37 (92%) FEV1 2.02 (104%) Ratio 85  TLC 87% DLCO 65% Interpretation: Normal spirometry and lung volumes. Compared to prior reduced DLCO, remains mild   Labs:    Latest Ref Rng & Units 06/15/2022    9:35 AM 04/06/2022    5:08 PM 03/21/2022   10:20 AM  CBC  WBC 4.0 - 10.5 K/uL 6.7  7.8  6.5   Hemoglobin 12.0 - 15.0 g/dL 13.1  13.6  13.5   Hematocrit 36.0 - 46.0 % 40.1  40.2  40.3   Platelets 150 - 400 K/uL 239  231  229.0       Latest Ref Rng & Units 06/15/2022    9:35 AM 04/06/2022    5:08 PM 03/21/2022   10:20 AM  CMP  Glucose 70 - 99 mg/dL 120  95  112   BUN 8 - 23 mg/dL '15  26  21   '$ Creatinine 0.44 - 1.00 mg/dL 0.66  0.62  0.60   Sodium 135 - 145 mmol/L 137  138  141   Potassium 3.5 - 5.1 mmol/L 3.9  3.9  4.0   Chloride 98 - 111 mmol/L 107  107  105   CO2 22 - 32 mmol/L '23  24  29   '$ Calcium 8.9 - 10.3 mg/dL 8.6  9.2  9.3   Total Protein 6.0 - 8.3 g/dL   6.8   Total Bilirubin 0.2 - 1.2 mg/dL   0.9   Alkaline Phos 39 - 117 U/L   74   AST 0 - 37 U/L   36   ALT 0 - 35 U/L   34     Pathology: 06/29/22 LUL Needle, endobronchial biopsy- Numerous noncaseating granulomas  Sleep: PSG 03/30/22 - No sleep apnea. Nocturnal hypoxemia  ONO 08/11/23 SpO2 <88% 1 hour and 7 min. Average SpO2 89%. Nadir SpO2 72% Interpretation: Qualifies for oxygen     Assessment & Plan:   Discussion: 72 year old female never smoker with pulmonary sarcoid GERD, HTN, HLD, atrial fibrillation who presents for follow-up  We discussed the clinical course of sarcoid and management including serial PFTs, labs, eye exam, and EKG and chest imaging if indicated. Current symptoms suggestive of active sarcoid flare. Unable to tolerate steroids. We discussed steroid sparing biologics including methotrexate. We also discussed trialing a lower dose steroid which patient is agreeable to but for long term management she would prefer other options. Addressed questions and concerns regarding high risk monitoring  Adverse effects of steroids discussed including bruising, weight gain, fluid retention, Cushingoid features, hypertension, cardiac arrhythmias, osteoporosis, gastric ulcers, increased risk of infection, insomnia and hyperglycemia.  Pulmonary sarcoidosis - active flare Chronic couhg --Dx in 05/29/22 and 06/15/22 via endobronchial bx --PET/CT 06/07/22  History of immunosuppression High risk medication management --06/2022-09/2022 Prednisone 30 mg. Discontinued d/t agitation, HA. No taper --Start prednisone 10 mg daily --Order lab:CBC w diff, CMP, Hep B core antibody IgM, Hep B surface antigen, HIV (if risk factors), Hep C antibody, TB gold --Will message pharmacy team for initiation of methotrexate with goal 20 mg weekly  Sarcoid Monitoring --Recent chest imaging reviewed. Plan for repeat imaging  after on stable methotrexate dosing --Annual PFTs.  Last PFTs today --Annual ophthalmology exam. Daughter will schedule --Followed by EP at [facility]. --Routine labs as needed: CBC with diff, CMET, 1, 25 and 25  hydroxy vitamin D, urinary calcium  Nocturnal hypoxemia Continue oxygen 2L nightly  Health Maintenance Immunization History  Administered Date(s) Administered   Fluad Quad(high Dose 65+) 05/02/2019, 06/23/2020, 06/30/2021, 05/29/2022   Influenza, High Dose Seasonal PF 06/14/2016, 05/15/2018   Influenza,inj,Quad PF,6+ Mos 06/11/2014   Influenza,inj,quad, With Preservative 06/11/2017   PFIZER(Purple Top)SARS-COV-2 Vaccination 10/03/2019, 10/24/2019, 06/17/2020, 03/16/2021   Pfizer Covid-19 Vaccine Bivalent Booster 33yr & up 08/02/2021   Pneumococcal Conjugate-13 08/07/2016   Pneumococcal Polysaccharide-23 01/28/2018   Tdap 01/28/2018   CT Lung Screen - not qualified. Serial imaging as noted above  Orders Placed This Encounter  Procedures   CBC w/Diff    Standing Status:   Future    Number of Occurrences:   1    Standing Expiration Date:   10/05/2023   Comp Met (CMET)    Standing Status:   Future    Number of Occurrences:   1    Standing Expiration Date:   10/05/2023   Hepatitis B Core Antibody, IgM    Standing Status:   Future    Number of Occurrences:   1    Standing Expiration Date:   10/05/2023   Hepatitis C Antibody    Standing Status:   Future    Number of Occurrences:   1    Standing Expiration Date:   10/05/2023   Hepatitis B surface antibody,quantitative   Pulmonary function test    Standing Status:   Future    Number of Occurrences:   1    Standing Expiration Date:   10/06/2023    Order Specific Question:   Where should this test be performed?    Answer:   [facility]    Order Specific Question:   Full PFT: includes the following: basic spirometry, spirometry pre & post bronchodilator, diffusion capacity (DLCO), lung volumes    Answer:   Full PFT   Meds ordered this encounter  Medications   benzonatate (TESSALON) 200 MG capsule    Sig: Take 1 capsule (200 mg total) by mouth 3 (three) times daily as needed for cough.    Dispense:  30 capsule    Refill:  1     Return in about 3 months (around 01/04/2023).  I have spent a total time of 60-minutes on the day of the appointment reviewing prior documentation, coordinating care and discussing medical diagnosis and plan with the patient/family. Imaging, labs and tests included in this note have been reviewed and interpreted independently by me.  CMount Pocono MD [facility] 10/05/2022 6:34 PM  Office Number [phone]

## 2022-10-05 NOTE — Telephone Encounter (Signed)
Please start patient on methotrexate for goal 20 mg weekly. Currently on prednisone 10 mg which will need to be weaned.

## 2022-10-06 LAB — CBC WITH DIFFERENTIAL/PLATELET
Basophils Absolute: 0.1 10*3/uL (ref 0.0–0.2)
Basos: 1 %
EOS (ABSOLUTE): 0.1 10*3/uL (ref 0.0–0.4)
Eos: 2 %
Hematocrit: 39.3 % (ref 34.0–46.6)
Hemoglobin: 12.7 g/dL (ref 11.1–15.9)
Immature Grans (Abs): 0.1 10*3/uL (ref 0.0–0.1)
Immature Granulocytes: 1 %
Lymphocytes Absolute: 1.8 10*3/uL (ref 0.7–3.1)
Lymphs: 22 %
MCH: 28.8 pg (ref 26.6–33.0)
MCHC: 32.3 g/dL (ref 31.5–35.7)
MCV: 89 fL (ref 79–97)
Monocytes Absolute: 0.6 10*3/uL (ref 0.1–0.9)
Monocytes: 8 %
Neutrophils Absolute: 5.4 10*3/uL (ref 1.4–7.0)
Neutrophils: 66 %
Platelets: 295 10*3/uL (ref 150–450)
RBC: 4.41 x10E6/uL (ref 3.77–5.28)
RDW: 12.7 % (ref 11.7–15.4)
WBC: 8.1 10*3/uL (ref 3.4–10.8)

## 2022-10-06 LAB — HEPATITIS C ANTIBODY: Hep C Virus Ab: NONREACTIVE

## 2022-10-06 LAB — COMPREHENSIVE METABOLIC PANEL
ALT: 19 IU/L (ref 0–32)
AST: 26 IU/L (ref 0–40)
Albumin/Globulin Ratio: 1.5 (ref 1.2–2.2)
Albumin: 4 g/dL (ref 3.8–4.8)
Alkaline Phosphatase: 77 IU/L (ref 44–121)
BUN/Creatinine Ratio: 32 — ABNORMAL HIGH (ref 12–28)
BUN: 25 mg/dL (ref 8–27)
Bilirubin Total: 0.5 mg/dL (ref 0.0–1.2)
CO2: 22 mmol/L (ref 20–29)
Calcium: 9.5 mg/dL (ref 8.7–10.3)
Chloride: 100 mmol/L (ref 96–106)
Creatinine, Ser: 0.78 mg/dL (ref 0.57–1.00)
Globulin, Total: 2.6 g/dL (ref 1.5–4.5)
Glucose: 124 mg/dL — ABNORMAL HIGH (ref 70–99)
Potassium: 4.1 mmol/L (ref 3.5–5.2)
Sodium: 139 mmol/L (ref 134–144)
Total Protein: 6.6 g/dL (ref 6.0–8.5)
eGFR: 81 mL/min/{1.73_m2} (ref >59)

## 2022-10-06 LAB — HEPATITIS B CORE ANTIBODY, IGM: Hep B C IgM: NEGATIVE

## 2022-10-09 NOTE — Telephone Encounter (Signed)
MTX counseling and rx pending lab. Hepatitis B surface antibody not surface antigen was order. Order requisiton was not provided to [facility] on site. I've called [facility] today (P: [phone]) to request an add-on but lab tech stated usually the max is 2 days after labs are drawn. Will f/u  She may need to come back for labs if [facility] is unable to add on Hep B surface antigen  Knox Saliva, PharmD, MPH, BCPS, CPP Clinical Pharmacist (Rheumatology and Pulmonology)

## 2022-10-09 NOTE — Telephone Encounter (Incomplete)
Pharmacy Note  Subjective: Patient called today by [facility] Pulmonology pharmacy team for counseling on methotrexate for sarcoidosis. Prior therapy includes:***.  She is currently taking prednisone '10mg'$  daily  Last seen by Dr. Loanne Drilling on 10/05/2022.   Objective: CBC    Component Value Date/Time   WBC 8.1 10/05/2022 1324   WBC 6.7 06/15/2022 0935   RBC 4.41 10/05/2022 1324   RBC 4.43 06/15/2022 0935   HGB 12.7 10/05/2022 1324   HCT 39.3 10/05/2022 1324   PLT 295 10/05/2022 1324   MCV 89 10/05/2022 1324   MCH 28.8 10/05/2022 1324   MCH 29.6 06/15/2022 0935   MCHC 32.3 10/05/2022 1324   MCHC 32.7 06/15/2022 0935   RDW 12.7 10/05/2022 1324   LYMPHSABS 1.8 10/05/2022 1324   MONOABS 0.6 09/24/2019 1224   EOSABS 0.1 10/05/2022 1324   BASOSABS 0.1 10/05/2022 1324    CMP     Component Value Date/Time   NA 139 10/05/2022 1324   K 4.1 10/05/2022 1324   CL 100 10/05/2022 1324   CO2 22 10/05/2022 1324   GLUCOSE 124 (H) 10/05/2022 1324   GLUCOSE 120 (H) 06/15/2022 0935   BUN 25 10/05/2022 1324   CREATININE 0.78 10/05/2022 1324   CALCIUM 9.5 10/05/2022 1324   PROT 6.6 10/05/2022 1324   ALBUMIN 4.0 10/05/2022 1324   AST 26 10/05/2022 1324   ALT 19 10/05/2022 1324   ALKPHOS 77 10/05/2022 1324   BILITOT 0.5 10/05/2022 1324   GFRNONAA >60 06/15/2022 0935   GFRAA >60 09/24/2019 1224    Baseline Immunosuppressant Therapy Labs TB GOLD   Hepatitis Panel    Latest Ref Rng & Units 10/05/2022    1:24 PM  Hepatitis  Hep B IgM Negative Negative   Hepatitis C antibody - nonreactive Hepatitis B core antibody - negative   HIV No results found for: "HIV" Immunoglobulins   SPEP    Latest Ref Rng & Units 10/05/2022    1:24 PM  Serum Protein Electrophoresis  Total Protein 6.0 - 8.5 g/dL 6.6    G6PD No results found for: "G6PDH" TPMT No results found for: "TPMT"   Chest-xray:  08/14/2022 - Stable focal parenchymal opacity in the posterior left upper lobe with surrounding  ground-glass opacity. 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  Contraception: post-menopausal  Alcohol use: ***  Assessment/Plan:   Patient was counseled on the purpose, proper use, and adverse effects of methotrexate including nausea, infection, and signs and symptoms of pneumonitis. Discussed that there is the possibility of an increased risk of malignancy, specifically lymphomas, but it is not well understood if this increased risk is due to the medication or the disease state.  Instructed patient that medication should be held for infection and prior to surgery.  Advised patient to avoid live vaccines. Recommend annual influenza, Pneumovax 23, Prevnar 13, and Shingrix as indicated.   Reviewed instructions with patient to take methotrexate weekly along with folic acid daily.  Discussed the importance of frequent monitoring of kidney and liver function and blood counts, and provided patient with standing lab instructions.  Counseled patient to avoid NSAIDs and alcohol while on methotrexate. Encouraged and answered all questions.   Patient voiced understanding.  Patient verbally consented to methotrexate use.    Week Methotrexate Dose (weekly) Folic acid dose (daily) Prednisone dose (daily) Labwork  1 10 mg (4 tabs) 1 mg 10 mg   2 10 mg (4 tabs) 1 mg 10 mg Labs  3 12.5 mg (5 tabs) 1  mg 9 mg   4 12.5 mg (5 tabs) 1 mg 8 mg Labs  5 15 mg (6 tabs) 2 mg 7 mg   6 15 mg (6 tabs) 2 mg 6 mg Labs  7 17.'5mg'$  (7 tabs) 2 mg 5 mg   8 '20mg'$  (8 tabs) 2 mg 5 mg Labs

## 2022-10-10 ENCOUNTER — Encounter: Payer: Self-pay | Admitting: Family Medicine

## 2022-10-11 LAB — SPECIMEN STATUS REPORT

## 2022-10-11 LAB — HEPATITIS B SURFACE ANTIBODY, QUANTITATIVE: Hepatitis B Surf Ab Quant: 3.6 m[IU]/mL — ABNORMAL LOW (ref >9.9)

## 2022-10-12 ENCOUNTER — Encounter: Payer: Self-pay | Admitting: Nurse Practitioner

## 2022-10-12 ENCOUNTER — Ambulatory Visit (INDEPENDENT_AMBULATORY_CARE_PROVIDER_SITE_OTHER): Payer: Medicare Other | Admitting: Nurse Practitioner

## 2022-10-12 VITALS — BP 128/76 | HR 55 | Temp 98.4°F | Ht 61.0 in | Wt 168.0 lb

## 2022-10-12 DIAGNOSIS — Z6831 Body mass index (BMI) 31.0-31.9, adult: Secondary | ICD-10-CM

## 2022-10-12 DIAGNOSIS — E669 Obesity, unspecified: Secondary | ICD-10-CM | POA: Insufficient documentation

## 2022-10-12 DIAGNOSIS — D869 Sarcoidosis, unspecified: Secondary | ICD-10-CM | POA: Diagnosis not present

## 2022-10-13 ENCOUNTER — Encounter (HOSPITAL_BASED_OUTPATIENT_CLINIC_OR_DEPARTMENT_OTHER): Payer: Self-pay | Admitting: Pulmonary Disease

## 2022-10-13 NOTE — Telephone Encounter (Signed)
Mychart message sent by pt: Lisa Acosta  P [facility] Clinical Pool (supporting Chi Rodman Pickle, MD)1 minute ago (3:51 PM)    I tried taking the 10 mg of prednisone as you prescribed but the side effects were too much for me to continue it. Do you know when I will get the autoimmune medication you prescribed?      Dr. Loanne Drilling, please advise.

## 2022-10-17 DIAGNOSIS — Z79899 Other long term (current) drug therapy: Secondary | ICD-10-CM | POA: Diagnosis not present

## 2022-10-17 DIAGNOSIS — D869 Sarcoidosis, unspecified: Secondary | ICD-10-CM | POA: Diagnosis not present

## 2022-10-17 NOTE — Telephone Encounter (Signed)
Spoke with patient regarding labs. She will plan to go to [facility] in [facility]. Information sent to pt via MyChart. We need hep B surface antigen to proceed  Knox Saliva, PharmD, MPH, BCPS, CPP Clinical Pharmacist (Rheumatology and Pulmonology)

## 2022-10-17 NOTE — Telephone Encounter (Signed)
Spoke with patient on phone. She reports increased urinary frequency and headaches with prednisone use.  Pharmacy team has been contacted to start methotrexate when able. Patient has given labs today and hopefully will be able to start once this results.

## 2022-10-18 ENCOUNTER — Telehealth: Payer: Self-pay | Admitting: Family Medicine

## 2022-10-18 LAB — HEPATITIS B SURFACE ANTIGEN: Hepatitis B Surface Ag: NEGATIVE

## 2022-10-18 NOTE — Telephone Encounter (Signed)
Copied from Organ [id number]. Topic: Medicare AWV >> Oct 18, 2022 11:09 AM Devoria Glassing wrote: Reason for CRM: Left message for patient to schedule Annual Wellness Visit(AWV).  Please schedule with Health Nurse Advisor at [facility]. Please call [phone] ask for [facility].

## 2022-10-19 ENCOUNTER — Encounter: Payer: Self-pay | Admitting: Family Medicine

## 2022-10-19 MED ORDER — FOLIC ACID 1 MG PO TABS: 2.0000 mg | ORAL_TABLET | Freq: Every day | ORAL | 3 refills | Status: DC

## 2022-10-19 MED ORDER — METHOTREXATE SODIUM 2.5 MG PO TABS: ORAL_TABLET | ORAL | 0 refills | Status: DC

## 2022-10-19 NOTE — Telephone Encounter (Signed)
Pharmacy Note  Subjective: Patient called today by [facility] Pulmonology pharmacy team for counseling on methotrexate for sarcoidosis.  She states that she stopped prednisone due to headaches and believe Dr. Loanne Drilling is aware  Last seen by Dr. Loanne Drilling on 10/05/2022.   Objective: CBC    Component Value Date/Time   WBC 8.1 10/05/2022 1324   WBC 6.7 06/15/2022 0935   RBC 4.41 10/05/2022 1324   RBC 4.43 06/15/2022 0935   HGB 12.7 10/05/2022 1324   HCT 39.3 10/05/2022 1324   PLT 295 10/05/2022 1324   MCV 89 10/05/2022 1324   MCH 28.8 10/05/2022 1324   MCH 29.6 06/15/2022 0935   MCHC 32.3 10/05/2022 1324   MCHC 32.7 06/15/2022 0935   RDW 12.7 10/05/2022 1324   LYMPHSABS 1.8 10/05/2022 1324   MONOABS 0.6 09/24/2019 1224   EOSABS 0.1 10/05/2022 1324   BASOSABS 0.1 10/05/2022 1324    CMP     Component Value Date/Time   NA 139 10/05/2022 1324   K 4.1 10/05/2022 1324   CL 100 10/05/2022 1324   CO2 22 10/05/2022 1324   GLUCOSE 124 (H) 10/05/2022 1324   GLUCOSE 120 (H) 06/15/2022 0935   BUN 25 10/05/2022 1324   CREATININE 0.78 10/05/2022 1324   CALCIUM 9.5 10/05/2022 1324   PROT 6.6 10/05/2022 1324   ALBUMIN 4.0 10/05/2022 1324   AST 26 10/05/2022 1324   ALT 19 10/05/2022 1324   ALKPHOS 77 10/05/2022 1324   BILITOT 0.5 10/05/2022 1324   GFRNONAA >60 06/15/2022 0935   GFRAA >60 09/24/2019 1224    Baseline Immunosuppressant Therapy Labs  Hepatitis Panel    Latest Ref Rng & Units 10/17/2022    4:00 PM  Hepatitis  Hep B Surface Ag Negative Negative   Hepatitis C antibody - nonreactive Hepatitis B core antibody - negative Hepatitis B surface antigen - negative on 10/17/2022   HIV No results found for: "HIV" Immunoglobulins   SPEP    Latest Ref Rng & Units 10/05/2022    1:24 PM  Serum Protein Electrophoresis  Total Protein 6.0 - 8.5 g/dL 6.6    G6PD No results found for: "G6PDH" TPMT No results found for: "TPMT"   Chest-xray:  08/14/2022 - Stable focal parenchymal  opacity in the posterior left upper lobe with surrounding ground-glass opacity. 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  Contraception: post-menopausal  Alcohol use: rarely (once a year)  Assessment/Plan:   Patient was counseled on the purpose, proper use, and adverse effects of methotrexate including nausea, infection, and signs and symptoms of pneumonitis. Discussed that there is the possibility of an increased risk of malignancy, specifically lymphomas, but it is not well understood if this increased risk is due to the medication or the disease state.  Instructed patient that medication should be held for infection and prior to surgery.  Advised patient to avoid live vaccines. Recommend annual influenza, Pneumovax 23, Prevnar 13, and Shingrix as indicated.   Reviewed instructions with patient to take methotrexate weekly along with folic acid daily.  Discussed the importance of frequent monitoring of kidney and liver function and blood counts, and provided patient with standing lab instructions (CBC, CMP x 2 weeks x 4 then every 3 motnhs).  Counseled patient to avoid NSAIDs and alcohol while on methotrexate. Encouraged and answered all questions.   Patient voiced understanding.  Patient verbally consented to methotrexate use.    Week Methotrexate Dose (weekly) Folic acid dose (daily) Labwork  1 10 mg (4 tabs) 1  mg   2 10 mg (4 tabs) 1 mg Labs to include CBC, CMP and TB gold  3 12.5 mg (5 tabs) 1 mg   4 12.5 mg (5 tabs) 1 mg Labs  5 15 mg (6 tabs) 2 mg   6 15 mg (6 tabs) 2 mg Labs  7 17.'5mg'$  (7 tabs) 2 mg   8 '20mg'$  (8 tabs) 2 mg Labs    Rx for MTX and folic acid sent to pharmacy.  She'd like labs completed at [facility] in [facility]. Standing labs placed.  Routing to Dr. Loanne Drilling for advisement on prednisone and if ok for pt to be off for now due to intolerable side effects.  Knox Saliva, PharmD, MPH, BCPS, CPP Clinical Pharmacist (Rheumatology and Pulmonology)

## 2022-10-19 NOTE — Progress Notes (Signed)
Chief Complaint:   OBESITY Lisa Acosta is here to discuss her progress with her obesity treatment plan along with follow-up of her obesity related diagnoses. Lisa Acosta is on the Category 1 Plan and states she is following her eating plan approximately 20% of the time. Lisa Acosta states she is exercising 0 minutes 0 times per week.  Today's visit was #: 32 Starting weight: 198 lbs Starting date: 01/02/2021 Today's weight: 168 lbs Today's date: 10/12/2022 Total lbs lost to date: 20 lbs Total lbs lost since last in-office visit: 0  Interim History: Lisa Acosta saw pulmonary recently and was told to follow a Mediterranean diet.  Plans to start going to the gym.  Subjective:   1. Sarcoidosis Saw pulmonary last on 10/05/22.  Given prescription for Prednisone 10 mg but stopped due to side effects of headache and urinary incontinence.  Plans to start Methotrexate.     Assessment/Plan:   1. Sarcoidosis Continue to follow up pulmonary.  2. Generalized obesity  3. BMI 31.0-31.9,adult Lisa Acosta is currently in the action stage of change. As such, her goal is to continue with weight loss efforts. She has agreed to [employer].   Exercise goals: Older adults should follow the adult guidelines. When older adults cannot meet the adult guidelines, they should be as physically active as their abilities and conditions will allow.   Behavioral modification strategies: increasing lean protein intake, increasing vegetables, increasing water intake, and planning for success.  Lisa Acosta has agreed to follow-up with our clinic in 4 weeks. She was informed of the importance of frequent follow-up visits to maximize her success with intensive lifestyle modifications for her multiple health conditions.   Objective:   Blood pressure 128/76, pulse (!) 55, temperature 98.4 F (36.9 C), height 5' 1"$  (1.549 m), weight 168 lb (76.2 kg), SpO2 95 %. Body mass index is 31.74 kg/m.  General: Cooperative, alert, well developed, in  no acute distress. HEENT: Conjunctivae and lids unremarkable. Cardiovascular: Regular rhythm.  Lungs: Normal work of breathing. Neurologic: No focal deficits.   Lab Results  Component Value Date   CREATININE 0.78 10/05/2022   BUN 25 10/05/2022   NA 139 10/05/2022   K 4.1 10/05/2022   CL 100 10/05/2022   CO2 22 10/05/2022   Lab Results  Component Value Date   ALT 19 10/05/2022   AST 26 10/05/2022   ALKPHOS 77 10/05/2022   BILITOT 0.5 10/05/2022   Lab Results  Component Value Date   HGBA1C 5.6 11/16/2021   HGBA1C 5.7 06/30/2021   HGBA1C 6.2 (H) 01/05/2021   HGBA1C 5.9 06/23/2019   HGBA1C 5.8 04/15/2018   Lab Results  Component Value Date   INSULIN 20.4 01/05/2021   Lab Results  Component Value Date   TSH 0.86 11/16/2021   Lab Results  Component Value Date   CHOL 180 11/16/2021   HDL 65.10 11/16/2021   LDLCALC 93 11/16/2021   LDLDIRECT 112.0 01/22/2017   TRIG 108.0 11/16/2021   CHOLHDL 3 11/16/2021   Lab Results  Component Value Date   VD25OH 79.10 11/16/2021   VD25OH 25.0 (L) 01/05/2021   Lab Results  Component Value Date   WBC 8.1 10/05/2022   HGB 12.7 10/05/2022   HCT 39.3 10/05/2022   MCV 89 10/05/2022   PLT 295 10/05/2022   No results found for: "IRON", "TIBC", "FERRITIN"   Attestation Statements:   Reviewed by clinician on day of visit: allergies, medications, problem list, medical history, surgical history, family history, social history, and previous  encounter notes.  I spent 30 minutes with the patient and reviewing her chart before and after her visit.    I, Brendell Tyus, RMA, am acting as transcriptionist for Everardo Pacific, FNP.  I have reviewed the above documentation for accuracy and completeness, and I agree with the above. Everardo Pacific, FNP

## 2022-10-30 ENCOUNTER — Other Ambulatory Visit: Payer: Self-pay | Admitting: Family Medicine

## 2022-10-30 DIAGNOSIS — G4700 Insomnia, unspecified: Secondary | ICD-10-CM

## 2022-11-01 ENCOUNTER — Other Ambulatory Visit: Payer: Self-pay | Admitting: Pulmonary Disease

## 2022-11-02 NOTE — Telephone Encounter (Signed)
Dr. Rodman Pickle is managing her immunosuppression at this time.

## 2022-11-11 ENCOUNTER — Other Ambulatory Visit: Payer: Self-pay

## 2022-11-11 ENCOUNTER — Encounter (HOSPITAL_BASED_OUTPATIENT_CLINIC_OR_DEPARTMENT_OTHER): Payer: Self-pay | Admitting: Emergency Medicine

## 2022-11-11 ENCOUNTER — Emergency Department (HOSPITAL_BASED_OUTPATIENT_CLINIC_OR_DEPARTMENT_OTHER)
Admission: EM | Admit: 2022-11-11 | Discharge: 2022-11-11 | Disposition: A | Discharge status: Home / Self Care | Payer: Medicare Other | Attending: Emergency Medicine | Admitting: Emergency Medicine

## 2022-11-11 DIAGNOSIS — Z7901 Long term (current) use of anticoagulants: Secondary | ICD-10-CM | POA: Diagnosis not present

## 2022-11-11 DIAGNOSIS — Z20822 Contact with and (suspected) exposure to covid-19: Secondary | ICD-10-CM | POA: Diagnosis not present

## 2022-11-11 DIAGNOSIS — M5441 Lumbago with sciatica, right side: Secondary | ICD-10-CM | POA: Diagnosis not present

## 2022-11-11 DIAGNOSIS — M545 Low back pain, unspecified: Secondary | ICD-10-CM | POA: Diagnosis present

## 2022-11-11 LAB — RESP PANEL BY RT-PCR (RSV, FLU A&B, COVID)  RVPGX2
Influenza A by PCR: NEGATIVE
Influenza B by PCR: NEGATIVE
Resp Syncytial Virus by PCR: NEGATIVE
SARS Coronavirus 2 by RT PCR: NEGATIVE

## 2022-11-11 LAB — GROUP A STREP BY PCR: Group A Strep by PCR: NOT DETECTED

## 2022-11-11 MED ORDER — CYCLOBENZAPRINE HCL 10 MG PO TABS: 10.0000 mg | ORAL_TABLET | Freq: Two times a day (BID) | ORAL | 0 refills | Status: DC | PRN

## 2022-11-11 MED ORDER — HYDROCODONE-ACETAMINOPHEN 5-325 MG PO TABS: 2.0000 | ORAL_TABLET | ORAL | 0 refills | Status: DC | PRN

## 2022-11-11 MED ORDER — PREDNISONE 20 MG PO TABS
40.0000 mg | ORAL_TABLET | Freq: Once | ORAL | Status: AC
  Administered 2022-11-11: 40 mg via ORAL
  Filled 2022-11-11: qty 2

## 2022-11-11 MED ORDER — CYCLOBENZAPRINE HCL 5 MG PO TABS
5.0000 mg | ORAL_TABLET | Freq: Once | ORAL | Status: AC
  Administered 2022-11-11: 5 mg via ORAL
  Filled 2022-11-11: qty 1

## 2022-11-11 NOTE — ED Triage Notes (Signed)
Pt arrives pov, to triage in wheelchair with c/o RT side lower back pain radiating to anterior RLE x 3 days, hx of same. Denies injury

## 2022-11-11 NOTE — Discharge Instructions (Addendum)
Please continue your home pain medications Recheck with your doctor next week Take the Flexeril as prescribed as needed for pain Return if you are having severe worsening of pain, loss of bowel or bladder control, or weakness in your legs. COVID, flu, RSV, and strep are pending. Prescription for 10 hydrocodone are also sent to your pharmacy Please confirm that we have the correct contact information by phone for you.

## 2022-11-11 NOTE — ED Provider Notes (Addendum)
[place] EMERGENCY DEPARTMENT AT [place] Provider Note   CSN: [id number] Arrival date & time: 11/11/22  [id number]     History  Chief Complaint  Patient presents with   Back Pain    Lisa Acosta is a 73 y.o. female.  HPI   72 year old female presents today complaining of right low back pain that radiates into the buttock and right upper leg.  She reports that the symptoms have been present for the past 3 days she has had some similar symptoms in the past and has a known history of degenerative disc disease in her back.  She denies any fever, chills, lateralized weakness, loss of bowel or bladder control or paresthesias she has taken over-the-counter medication at home and hydrocodone  Home Medications Prior to Admission medications   Medication Sig Start Date End Date Taking? Authorizing Provider  cyclobenzaprine (FLEXERIL) 10 MG tablet Take 1 tablet (10 mg total) by mouth 2 (two) times daily as needed for muscle spasms. 11/11/22  Yes Pattricia Boss, MD  alendronate (FOSAMAX) 70 MG tablet TAKE 1 TABLET BY MOUTH EVERY 7 DAYS, TAKE WITH A FULL GLASS OF WATER ON AN EMPTY STOMACH Patient taking differently: Take 70 mg by mouth every Sunday. 05/17/22   Copland, Gay Filler, MD  amLODipine (NORVASC) 5 MG tablet Take 1 tablet (5 mg total) by mouth daily. 12/29/21   Copland, Gay Filler, MD  atenolol (TENORMIN) 50 MG tablet Take 1 tablet (50 mg total) by mouth daily. Patient taking differently: Take 50 mg by mouth at bedtime. 12/29/21   Copland, Gay Filler, MD  benzonatate (TESSALON) 200 MG capsule Take 1 capsule (200 mg total) by mouth 3 (three) times daily as needed for cough. 10/05/22   Margaretha Seeds, MD  Calcium Carb-Cholecalciferol (CALCIUM 600 + D PO) Take 1 tablet by mouth in the morning and at bedtime.    [provider]  flecainide (TAMBOCOR) 100 MG tablet Take 1 tablet (100 mg total) by mouth 2 (two) times daily. 08/17/22   Copland, Gay Filler, MD  FLUoxetine  (PROZAC) 40 MG capsule Take 1 capsule (40 mg total) by mouth 2 (two) times daily. 12/29/21   Copland, Gay Filler, MD  folic acid (FOLVITE) 1 MG tablet Take 2 tablets (2 mg total) by mouth daily. 10/19/22   Cassandria Anger, RPH-CPP  Krill Oil 500 MG CAPS Take 500 mg by mouth daily.    [provider]  levothyroxine (SYNTHROID) 125 MCG tablet TAKE 1 TABLET BY MOUTH DAILY BEFORE BREAKFAST. 02/10/22   Copland, Gay Filler, MD  magnesium oxide (MAG-OX) 400 (240 Mg) MG tablet Take 400 mg by mouth daily.    [provider]  methotrexate (RHEUMATREX) 2.5 MG tablet Take 4 tabs once weekly x 2 wks. Then 5 tabs weekly x 2 wks. Then 6 tabs weekly x  2 wks. Then 8 tabs weekly thereafter. 10/19/22   Cassandria Anger, RPH-CPP  Multiple Vitamins-Minerals (PRESERVISION AREDS 2+MULTI VIT PO) Take 1 capsule by mouth daily.    [provider]  omeprazole (PRILOSEC) 40 MG capsule TAKE 1 CAPSULE (40 MG TOTAL) BY MOUTH IN THE MORNING AND AT BEDTIME. 10/30/22   Copland, Gay Filler, MD  Propylene Glycol (SYSTANE BALANCE) 0.6 % SOLN Place 1 drop into both eyes as needed (dry eyes).    [provider]  rosuvastatin (CRESTOR) 20 MG tablet TAKE 1 TABLET BY MOUTH EVERY DAY 12/19/21   Copland, Gay Filler, MD  traZODone (DESYREL) 50 MG tablet  TAKE 1.5 TABLETS BY MOUTH AT BEDTIME. 10/30/22   Copland, Gay Filler, MD  XARELTO 20 MG TABS tablet TAKE 1 TABLET BY MOUTH EVERY DAY 07/24/22   Copland, Gay Filler, MD      Allergies    Dextran, Tree extract, Penicillin g, Penicillins, and Sulfa antibiotics    Review of Systems   Review of Systems  Physical Exam Updated Vital Signs BP 123/82   Pulse (!) 53   Temp 98.2 F (36.8 C) (Oral)   Resp 18   Ht 1.549 m ('5\' 1"'$ )   Wt 76.7 kg   SpO2 96%   BMI 31.93 kg/m  Physical Exam Vitals and nursing note reviewed.  Constitutional:      Appearance: She is well-developed.  HENT:     Head: Normocephalic and atraumatic.     Right Ear: External ear normal.     Left  Ear: External ear normal.     Nose: Nose normal.  Eyes:     Conjunctiva/sclera: Conjunctivae normal.     Pupils: Pupils are equal, round, and reactive to light.  Cardiovascular:     Rate and Rhythm: Normal rate and regular rhythm.     Heart sounds: Normal heart sounds.  Pulmonary:     Effort: Pulmonary effort is normal.     Breath sounds: Normal breath sounds.  Abdominal:     General: Bowel sounds are normal.     Palpations: Abdomen is soft.  Musculoskeletal:        General: Normal range of motion.     Cervical back: Normal range of motion and neck supple.  Skin:    General: Skin is warm and dry.  Neurological:     General: No focal deficit present.     Mental Status: She is alert and oriented to person, place, and time.     Sensory: No sensory deficit.     Motor: No weakness.     Coordination: Coordination normal.     Deep Tendon Reflexes: Reflexes are normal and symmetric. Reflexes normal.  Psychiatric:        Behavior: Behavior normal.        Thought Content: Thought content normal.        Judgment: Judgment normal.     ED Results / Procedures / Treatments   Labs (all labs ordered are listed, but only abnormal results are displayed) Labs Reviewed - No data to display  EKG None  Radiology No results found.  Procedures Procedures    Medications Ordered in ED Medications  cyclobenzaprine (FLEXERIL) tablet 5 mg (has no administration in time range)  predniSONE (DELTASONE) tablet 40 mg (has no administration in time range)    ED Course/ Medical Decision Making/ A&P                             Medical Decision Making Risk Prescription drug management.   1-patient with low back pain with consistent with prior DJD and appears to have some right-sided sciatica.  No acute red flags such as loss of bowel or bladder control or perineal lesions noted on exam. Patient has hydrocodone at home but states she only has 1 left she is given prescription for 10 She is  given prescription for Flexeril 2 patient states that she began having a sore throat with some coughing last night.  COVID and flu send as well as strep sent.  Patient requested to leave prior to these resulted.  She  is informed she will need to follow-up on MyChart.        Final Clinical Impression(s) / ED Diagnoses Final diagnoses:  Acute right-sided low back pain with right-sided sciatica    Rx / DC Orders ED Discharge Orders          Ordered    cyclobenzaprine (FLEXERIL) 10 MG tablet  2 times daily PRN        11/11/22 0740              Pattricia Boss, MD 11/11/22 [id number]    Pattricia Boss, MD 11/11/22 [id number]

## 2022-11-13 ENCOUNTER — Encounter: Payer: Self-pay | Admitting: Family Medicine

## 2022-11-13 ENCOUNTER — Other Ambulatory Visit: Payer: Self-pay | Admitting: Family Medicine

## 2022-11-13 ENCOUNTER — Ambulatory Visit (INDEPENDENT_AMBULATORY_CARE_PROVIDER_SITE_OTHER): Payer: Medicare Other | Admitting: Family Medicine

## 2022-11-13 ENCOUNTER — Ambulatory Visit (HOSPITAL_BASED_OUTPATIENT_CLINIC_OR_DEPARTMENT_OTHER)
Admission: RE | Admit: 2022-11-13 | Discharge: 2022-11-13 | Disposition: A | Discharge status: Home / Self Care | Payer: Medicare Other | Source: Ambulatory Visit | Attending: Family Medicine | Admitting: Family Medicine

## 2022-11-13 VITALS — BP 112/70 | HR 51 | Temp 98.3°F | Resp 20 | Ht 61.0 in | Wt 172.6 lb

## 2022-11-13 DIAGNOSIS — M5441 Lumbago with sciatica, right side: Secondary | ICD-10-CM

## 2022-11-13 DIAGNOSIS — M47816 Spondylosis without myelopathy or radiculopathy, lumbar region: Secondary | ICD-10-CM | POA: Diagnosis not present

## 2022-11-13 DIAGNOSIS — M5136 Other intervertebral disc degeneration, lumbar region: Secondary | ICD-10-CM | POA: Diagnosis not present

## 2022-11-13 DIAGNOSIS — M545 Low back pain, unspecified: Secondary | ICD-10-CM | POA: Diagnosis not present

## 2022-11-13 MED ORDER — CYCLOBENZAPRINE HCL 10 MG PO TABS: 10.0000 mg | ORAL_TABLET | Freq: Two times a day (BID) | ORAL | 0 refills | Status: DC | PRN

## 2022-11-13 MED ORDER — HYDROCODONE-ACETAMINOPHEN 5-325 MG PO TABS: 2.0000 | ORAL_TABLET | ORAL | 0 refills | Status: DC | PRN

## 2022-11-13 NOTE — Progress Notes (Signed)
Established Patient Office Visit  Subjective   Patient ID: Lisa Acosta, female    DOB: Dec 09, 1950  Age: 72 y.o. MRN: [id number]  Chief Complaint  Patient presents with   ED follow up    Pt seen in ED on 11/11/22 for lower back pain. Pt states no improvement.     HPI Pt is here c/o low back pain x 1 1/2 week.  It normally gets better with hydrocodone and muscle relaxer.  She joined a gym and rode a stationary bike and she thinks that is what triggered it   She is walking with a walker today because she is afraid of falling.  The er gave her 10 hydrocodone , prednisone and muscle relaxer.   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   Moderate episode of recurrent major depressive disorder ([place]) 09/26/2018   Generalized anxiety disorder 09/26/2018   Osteoporosis 01/25/2018   Osteoarthritis of hands, 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   Dyspnea 09/22/2016   OSA (obstructive sleep apnea) 08/31/2016   PAF (paroxysmal atrial fibrillation) ([place]) 03/16/2016   Hypothyroid 03/16/2016   Obesity (BMI 30-39.9) 03/16/2016   Essential hypertension 11/10/2015   Hyperlipidemia 11/10/2015   Past Medical History:  Diagnosis Date   Arthritis    Atrial fibrillation ([place])    Back pain    Complication of anesthesia    Constipation    Depression    Diverticulitis    Diverticulitis    Diverticulosis    Dysrhythmia    Esophageal ulcer    Fatty liver    Gait abnormality 10/14/2018    GERD (gastroesophageal reflux disease)    Hyperlipidemia    Hypertension    Hypothyroid    Memory difficulty 08/18/2019   OSA (obstructive sleep apnea) 08/31/2016   Osteopenia    Pneumonia    PONV (postoperative nausea and vomiting)    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   Pre-diabetes    Past Surgical History:  Procedure Laterality Date   BREAST BIOPSY Left    needle core biopsy, benign   BRONCHIAL BIOPSY  06/15/2022   Procedure: BRONCHIAL BIOPSIES;  Surgeon: Spero Geralds, MD;  Location: [facility] ENDOSCOPY;  Service: Pulmonary;;   BRONCHIAL BIOPSY  06/29/2022   Procedure: BRONCHIAL BIOPSIES;  Surgeon: Garner Nash, DO;  Location: [place] ENDOSCOPY;  Service: Pulmonary;;   BRONCHIAL NEEDLE ASPIRATION BIOPSY  06/15/2022   Procedure: BRONCHIAL NEEDLE ASPIRATION BIOPSIES;  Surgeon: Spero Geralds, MD;  Location: [facility] ENDOSCOPY;  Service: Pulmonary;;   BRONCHIAL NEEDLE ASPIRATION BIOPSY  06/29/2022   Procedure: BRONCHIAL NEEDLE ASPIRATION BIOPSIES;  Surgeon: Garner Nash, DO;  Location: [place];  Service: Pulmonary;;   BRONCHIAL WASHINGS  06/15/2022   Procedure: BRONCHIAL WASHINGS;  Surgeon: Spero Geralds, MD;  Location: [facility] ENDOSCOPY;  Service: Pulmonary;;   CATARACT EXTRACTION     DILATION AND CURETTAGE OF UTERUS     EYE SURGERY     HAND Boyd  ROTATOR CUFF REPAIR     TOTAL ABDOMINAL HYSTERECTOMY     VIDEO BRONCHOSCOPY N/A 06/15/2022   Procedure: VIDEO BRONCHOSCOPY WITH FLUORO;  Surgeon: Spero Geralds, MD;  Location[facility] END[facility]COPY;  Service: Pulmonary;  Laterality: N/A;   VIDEO BRONCHOSCOPY WITH ENDOBRONCHIAL ULTRASOUND  06/15/2022   Procedure: VIDEO BRONCHOSCOPY WITH ENDOBRONCHIAL ULTRASOUND;  Surgeon: Spero Geralds, MD;  Location: [facility] ENDOSCOPY;  Service: Pulmonary;;   VIDEO BRONCHOSCOPY WITH ENDOBRONCHIAL ULTRASOUND N/A 06/29/2022   Procedure: VIDEO BRONCHOSCOPY WITH ENDOBRONCHIAL ULTRASOUND;  Surgeon: Garner Nash, DO;  Location: [place];  Service: Pulmonary;  Laterality: N/A;   Social History   Tobacco Use   Smoking status: Never    Passive exposure: Current (doesn't smoke in house now or past 16yr)   Smokeless tobacco: Never  Vaping Use   Vaping Use: Never used  Substance Use Topics   Alcohol use: Yes    Comment: rare   Drug use: No   Social History   Socioeconomic History   Marital status: Married    Spouse name: John   Number of children: 1   Years of education: CEngineer, agriculturaleducation level: Not on file  Occupational History   Occupation: Retired    EFish farm manager OTHER    Comment: HRadiographer, therapeutic Tobacco Use   Smoking status: Never    Passive exposure: Current (doesn't smoke in house now or past 470yr   Smokeless tobacco: Never  Vaping Use   Vaping Use: Never used  Substance and Sexual Activity   Alcohol use: Yes    Comment: rare   Drug use: No   Sexual activity: Not on file  Other Topics Concern   Not on file  Social History Narrative   Patient lives at home spouse.   Caffeine use: 2 sodas weekly   Right handed    Social Determinants of Health   Financial Resource Strain: Low Risk  (10/27/2021)   Overall Financial Resource Strain (CARDIA)    Difficulty of Paying Living Expenses: Not hard at all  Food Insecurity: No Food Insecurity (10/27/2021)   Hunger Vital Sign    Worried About Running Out of Food in the Last Year: Never true    Ran Out of Food in the Last Year: Never true  Transportation Needs: No Transportation Needs (10/27/2021)   PRAPARE - TrHydrologistMedical): No    Lack of Transportation (Non-Medical): No  Physical Activity: Inactive (09/26/2018)   Exercise Vital Sign    Days of Exercise per Week: 0 days    Minutes of Exercise per Session: 0 min  Stress: No Stress Concern Present (10/27/2021)   [place]  Feeling of Stress : Only a little  Social Connections:  Socially Integrated (10/27/2021)   Social Connection and Isolation Panel [NHANES]    Frequency of Communication with Friends and Family: Once a week    Frequency of Social Gatherings with Friends and Family: More than three times a week    Attends Religious Services: More than 4 times per year    Active Member of [employer] Organizations: Yes    Attends ClArchivisteetings: More than 4 times per year    Marital Status: Married  InHuman resources officeriolence: Not At Risk (10/27/2021)   Humiliation, Afraid, Rape, and Kick questionnaire    Fear of Current or Ex-Partner: No    Emotionally Abused: No  Physically Abused: No    Sexually Abused: No   Family Status  Relation Name Status   Mother  Deceased   Father  Deceased   Sister  (Not Specified)   Brother  Insurance risk surveyor   Daughter  (Not Specified)   Family History  Problem Relation Age of Onset   Atrial fibrillation Mother    Congestive Heart Failure Mother    Depression Mother    Heart disease Mother    Stroke Mother    Thyroid disease Mother    Anxiety disorder Mother    Obesity Mother    Heart disease Father    Alcohol abuse Father    High blood pressure Father    High Cholesterol Father    Alcoholism Father    Depression Sister    Heart disease Brother    Heart disease Brother    Depression Daughter    Allergies  Allergen Reactions   Dextran Anaphylaxis   Tree Extract    Penicillin G Rash   Penicillins Rash   Sulfa Antibiotics Rash and Other (See Comments)      Review of Systems  Constitutional:  Negative for fever and malaise/fatigue.  HENT:  Negative for congestion.   Eyes:  Negative for blurred vision.  Respiratory:  Negative for shortness of breath.   Cardiovascular:  Negative for chest pain, palpitations and leg swelling.  Gastrointestinal:  Negative for abdominal pain, blood in stool and nausea.  Genitourinary:  Negative for dysuria and frequency.  Musculoskeletal:  Negative for falls.   Skin:  Negative for rash.  Neurological:  Negative for dizziness, loss of consciousness and headaches.  Endo/Heme/Allergies:  Negative for environmental allergies.  Psychiatric/Behavioral:  Negative for depression. The patient is not nervous/anxious.       Objective:     BP 112/70 (BP Location: Left Arm, Patient Position: Sitting, Cuff Size: Large)   Pulse (!) 51   Temp 98.3 F (36.8 C) (Oral)   Resp 20   Ht '5\' 1"'$  (1.549 m)   Wt 172 lb 9.6 oz (78.3 kg)   SpO2 96%   BMI 32.61 kg/m  BP Readings from Last 3 Encounters:  11/13/22 112/70  11/11/22 123/82  10/12/22 128/76   Wt Readings from Last 3 Encounters:  11/13/22 172 lb 9.6 oz (78.3 kg)  11/11/22 169 lb (76.7 kg)  10/12/22 168 lb (76.2 kg)   SpO2 Readings from Last 3 Encounters:  11/13/22 96%  11/11/22 96%  10/12/22 95%      Physical Exam Vitals and nursing note reviewed.  Constitutional:      Appearance: She is well-developed.  HENT:     Head: Normocephalic and atraumatic.  Eyes:     Conjunctiva/sclera: Conjunctivae normal.  Neck:     Thyroid: No thyromegaly.     Vascular: No carotid bruit or JVD.  Cardiovascular:     Rate and Rhythm: Normal rate and regular rhythm.     Heart sounds: Normal heart sounds. No murmur heard. Pulmonary:     Effort: Pulmonary effort is normal. No respiratory distress.     Breath sounds: Normal breath sounds. No wheezing or rales.  Chest:     Chest wall: No tenderness.  Musculoskeletal:        General: No tenderness.     Cervical back: Normal range of motion and neck supple.  Neurological:     Mental Status: She is alert and oriented to person, place, and time.     Comments: Dec  dtr R patella 3/5 strength R low leg ext and flexion 3/5 hip flexion R  3/5 toe flexion / ext       No results found for any visits on 11/13/22.  Last CBC Lab Results  Component Value Date   WBC 8.1 10/05/2022   HGB 12.7 10/05/2022   HCT 39.3 10/05/2022   MCV 89 10/05/2022   MCH 28.8  10/05/2022   RDW 12.7 10/05/2022   PLT 295 [id number]   Last metabolic panel Lab Results  Component Value Date   GLUCOSE 124 (H) 10/05/2022   NA 139 10/05/2022   K 4.1 10/05/2022   CL 100 10/05/2022   CO2 22 10/05/2022   BUN 25 10/05/2022   CREATININE 0.78 10/05/2022   EGFR 81 10/05/2022   CALCIUM 9.5 10/05/2022   PROT 6.6 10/05/2022   ALBUMIN 4.0 10/05/2022   LABGLOB 2.6 10/05/2022   AGRATIO 1.5 10/05/2022   BILITOT 0.5 10/05/2022   ALKPHOS 77 10/05/2022   AST 26 10/05/2022   ALT 19 10/05/2022   ANIONGAP 7 06/15/2022   Last lipids Lab Results  Component Value Date   CHOL 180 11/16/2021   HDL 65.10 11/16/2021   LDLCALC 93 11/16/2021   LDLDIRECT 112.0 01/22/2017   TRIG 108.0 11/16/2021   CHOLHDL 3 11/16/2021   Last hemoglobin A1c Lab Results  Component Value Date   HGBA1C 5.6 11/16/2021   Last thyroid functions Lab Results  Component Value Date   TSH 0.86 11/16/2021   Last vitamin D Lab Results  Component Value Date   VD25OH 79.10 11/16/2021   Last vitamin B12 and Folate Lab Results  Component Value Date   VITAMINB12 805 10/14/2018      The 10-year ASCVD risk score (Arnett DK, et al., 2019) is: 11.6%    Assessment & Plan:   Problem List Items Addressed This Visit   None Visit Diagnoses     Acute right-sided low back pain with right-sided sciatica    -  Primary   Relevant Medications   cyclobenzaprine (FLEXERIL) 10 MG tablet   HYDROcodone-acetaminophen (NORCO/VICODIN) 5-325 MG tablet   Other Relevant Orders   DG Lumbar Spine Complete (Completed)      Ice / heat  Pt muscle relaxers and pain meds  Go to Er if it worsens  No follow-ups on file.    Ann Held, DO

## 2022-11-14 ENCOUNTER — Ambulatory Visit: Payer: Medicare Other | Admitting: Nurse Practitioner

## 2022-11-14 ENCOUNTER — Ambulatory Visit
Admission: RE | Admit: 2022-11-14 | Discharge: 2022-11-14 | Disposition: A | Discharge status: Home / Self Care | Payer: Medicare Other | Source: Ambulatory Visit | Attending: Family Medicine | Admitting: Family Medicine

## 2022-11-14 DIAGNOSIS — M5416 Radiculopathy, lumbar region: Secondary | ICD-10-CM | POA: Diagnosis not present

## 2022-11-14 DIAGNOSIS — M5441 Lumbago with sciatica, right side: Secondary | ICD-10-CM

## 2022-11-15 ENCOUNTER — Encounter: Payer: Self-pay | Admitting: Family Medicine

## 2022-11-16 ENCOUNTER — Telehealth: Payer: Self-pay | Admitting: Family Medicine

## 2022-11-16 ENCOUNTER — Other Ambulatory Visit: Payer: Self-pay

## 2022-11-16 DIAGNOSIS — M5441 Lumbago with sciatica, right side: Secondary | ICD-10-CM

## 2022-11-16 NOTE — Telephone Encounter (Signed)
Contacted [place] to schedule their annual wellness visit. Call back at later date: 11/27/2022  *Sherol Dade; [facility] Direct Dial: [phone]

## 2022-11-20 ENCOUNTER — Telehealth: Payer: Self-pay

## 2022-11-20 NOTE — Telephone Encounter (Signed)
PA initiated via Covermymeds; KEY: [id number]. Awaiting determination.

## 2022-11-20 NOTE — Telephone Encounter (Signed)
PA approved. Effective 09/11/2022 - 02/18/2023

## 2022-11-24 ENCOUNTER — Encounter: Payer: Self-pay | Admitting: Family Medicine

## 2022-11-24 DIAGNOSIS — M5441 Lumbago with sciatica, right side: Secondary | ICD-10-CM

## 2022-11-24 MED ORDER — HYDROCODONE-ACETAMINOPHEN 5-325 MG PO TABS: 2.0000 | ORAL_TABLET | ORAL | 0 refills | Status: DC | PRN

## 2022-12-07 ENCOUNTER — Other Ambulatory Visit: Payer: Self-pay | Admitting: Family Medicine

## 2022-12-07 DIAGNOSIS — M5441 Lumbago with sciatica, right side: Secondary | ICD-10-CM

## 2022-12-12 ENCOUNTER — Telehealth: Payer: Self-pay | Admitting: Cardiology

## 2022-12-12 NOTE — Telephone Encounter (Signed)
Patient calling the office for samples of medication:   1.  What medication and dosage are you requesting samples for? XARELTO 20 MG TABS tablet   2.  Are you currently out of this medication? Yes. Patient states that she is $10 from meeting deductible and she isn't there just yet and this will be $550 for the 3 months. She is wanting to know if there are samples until her deductible is met. Please advise.

## 2022-12-13 DIAGNOSIS — F321 Major depressive disorder, single episode, moderate: Secondary | ICD-10-CM | POA: Diagnosis not present

## 2022-12-13 MED ORDER — RIVAROXABAN 20 MG PO TABS: 20.0000 mg | ORAL_TABLET | Freq: Every day | ORAL | 0 refills | Status: DC

## 2022-12-13 NOTE — Addendum Note (Signed)
Addended by: Ena Dawley on: 12/13/2022 04:33 PM   Modules accepted: Orders

## 2022-12-13 NOTE — Telephone Encounter (Signed)
Prescription refill request for Xarelto received.  Indication: AF Last office visit: 11/23 Lisa Acosta Weight: 78.3 Age: 72 Scr: 0.78 CrCl: 81  If she is only $10 from reaching her deductible, the Xarelto should only cost her copay plus the $10 to reach the deductible limit, but she is welcome to samples if we have any

## 2022-12-13 NOTE — Telephone Encounter (Signed)
Called patient, advised samples were placed up front for pick up of Xarelto 20 mg.   Patient verbalized understanding.

## 2022-12-20 ENCOUNTER — Other Ambulatory Visit: Payer: Self-pay | Admitting: Family Medicine

## 2022-12-20 DIAGNOSIS — E7800 Pure hypercholesterolemia, unspecified: Secondary | ICD-10-CM

## 2022-12-22 DIAGNOSIS — F331 Major depressive disorder, recurrent, moderate: Secondary | ICD-10-CM | POA: Diagnosis not present

## 2022-12-26 DIAGNOSIS — Z961 Presence of intraocular lens: Secondary | ICD-10-CM | POA: Diagnosis not present

## 2022-12-26 DIAGNOSIS — H04123 Dry eye syndrome of bilateral lacrimal glands: Secondary | ICD-10-CM | POA: Diagnosis not present

## 2022-12-26 DIAGNOSIS — H348112 Central retinal vein occlusion, right eye, stable: Secondary | ICD-10-CM | POA: Diagnosis not present

## 2022-12-26 DIAGNOSIS — H26493 Other secondary cataract, bilateral: Secondary | ICD-10-CM | POA: Diagnosis not present

## 2022-12-26 DIAGNOSIS — H524 Presbyopia: Secondary | ICD-10-CM | POA: Diagnosis not present

## 2022-12-26 DIAGNOSIS — H5202 Hypermetropia, left eye: Secondary | ICD-10-CM | POA: Diagnosis not present

## 2022-12-26 DIAGNOSIS — H5211 Myopia, right eye: Secondary | ICD-10-CM | POA: Diagnosis not present

## 2022-12-26 DIAGNOSIS — H52203 Unspecified astigmatism, bilateral: Secondary | ICD-10-CM | POA: Diagnosis not present

## 2022-12-27 NOTE — Telephone Encounter (Signed)
ONO was received and this was discussed with pt in a separate encounter.

## 2022-12-31 ENCOUNTER — Other Ambulatory Visit: Payer: Self-pay | Admitting: Family Medicine

## 2022-12-31 DIAGNOSIS — R002 Palpitations: Secondary | ICD-10-CM

## 2023-01-01 ENCOUNTER — Encounter: Payer: Self-pay | Admitting: *Deleted

## 2023-01-01 ENCOUNTER — Ambulatory Visit (INDEPENDENT_AMBULATORY_CARE_PROVIDER_SITE_OTHER): Payer: Medicare Other | Admitting: Nurse Practitioner

## 2023-01-01 ENCOUNTER — Telehealth: Payer: Self-pay | Admitting: Family Medicine

## 2023-01-01 ENCOUNTER — Encounter: Payer: Self-pay | Admitting: Nurse Practitioner

## 2023-01-01 ENCOUNTER — Other Ambulatory Visit: Payer: Self-pay | Admitting: Family Medicine

## 2023-01-01 VITALS — BP 123/61 | HR 55 | Temp 98.0°F | Ht 61.0 in | Wt 170.0 lb

## 2023-01-01 DIAGNOSIS — D869 Sarcoidosis, unspecified: Secondary | ICD-10-CM

## 2023-01-01 DIAGNOSIS — Z6832 Body mass index (BMI) 32.0-32.9, adult: Secondary | ICD-10-CM | POA: Diagnosis not present

## 2023-01-01 DIAGNOSIS — E669 Obesity, unspecified: Secondary | ICD-10-CM | POA: Diagnosis not present

## 2023-01-01 DIAGNOSIS — I480 Paroxysmal atrial fibrillation: Secondary | ICD-10-CM

## 2023-01-01 NOTE — Telephone Encounter (Signed)
Copied from CRM [id number]. Topic: Medicare AWV >> Jan 01, 2023  9:54 AM Payton Doughty wrote: Reason for CRM: Called patient to schedule Medicare Annual Wellness Visit (AWV). Left message for patient to call back and schedule Medicare Annual Wellness Visit (AWV).  Last date of AWV: 10/27/21  Please schedule an appointment at any time with Donne Anon, CMA  .  If any questions, please contact me.  Thank you ,  Verlee Rossetti; Care Guide Ambulatory Clinical Support [place] [facility] Direct Dial: [phone]

## 2023-01-01 NOTE — Progress Notes (Signed)
Office: [phone]  /  Fax: [phone]  WEIGHT SUMMARY AND BIOMETRICS  No data recorded Weight Gained Since Last Visit: 2lb   Vitals Temp: 98 F (36.7 C) BP: 123/61 Pulse Rate: (!) 55 SpO2: 96 %   Anthropometric Measurements Height:  (1.549 m) Weight: 170 lb (77.1 kg) BMI (Calculated): 32.14 Weight at Last Visit: 168lb Weight Gained Since Last Visit: 2lb Starting Weight: 168lb Total Weight Loss (lbs): 0 lb (0 kg)   Body Composition  Body Fat %: 46 % Fat Mass (lbs): 78.2 lbs Muscle Mass (lbs): 87.4 lbs Total Body Water (lbs): 63.6 lbs Visceral Fat Rating : 14   Other Clinical Data Fasting: No Today's Visit #: 20 Starting Date: 01/05/21     HPI  Chief Complaint: OBESITY  Lisa Acosta is here to discuss her progress with her obesity treatment plan. She is on the practicing portion control and making smarter food choices, such as increasing vegetables and decreasing simple carbohydrates and states she is following her eating plan approximately 0 % of the time. She states she is exercising 0 minutes 0 days per week.   Interval History:  Since last office visit she has gained 2 pounds.  She is seeing a therapist for depression on a regular basis. She reports her dementia is getting worse.  She is going to [place] to see her grandson graduate in May.  She loves to garden but is not able to work in her yard due to being a fall risk. Her daughter is going to help her get raised flower beds.  She has not been following the plan and hasn't been exercising.  She is struggling with cravings.  She is drinking water and fairlife protein shake daily (sometimes 2 daily), occ drinks a diet soda and unsweetened tea.   She is here today requesting to restart meal plan.   Sarcoidosis Saw pulmonary last on 10/05/22. She has a follow up visit scheduled on 01/05/23.   She started since her last visit Methotrexate. Since starting Methotrexate she is more fatigued.    PHYSICAL  EXAM:  Blood pressure 123/61, pulse (!) 55, temperature 98 F (36.7 C), height  (1.549 m), weight 170 lb (77.1 kg), SpO2 96 %. Body mass index is 32.12 kg/m.  General: She is overweight, cooperative, alert, well developed, and in no acute distress. PSYCH: Has normal mood, affect and thought process.   Extremities: No edema.  Neurologic: No gross sensory or motor deficits. No tremors or fasciculations noted.    DIAGNOSTIC DATA REVIEWED:  BMET    Component Value Date/Time   NA 139 10/05/2022 1324   K 4.1 10/05/2022 1324   CL 100 10/05/2022 1324   CO2 22 10/05/2022 1324   GLUCOSE 124 (H) 10/05/2022 1324   GLUCOSE 120 (H) 06/15/2022 0935   BUN 25 10/05/2022 1324   CREATININE 0.78 10/05/2022 1324   CALCIUM 9.5 10/05/2022 1324   GFRNONAA >60 06/15/2022 0935   GFRAA >60 09/24/2019 1224   Lab Results  Component Value Date   HGBA1C 5.6 11/16/2021   HGBA1C 6.0 01/22/2017   Lab Results  Component Value Date   INSULIN 20.4 01/05/2021   Lab Results  Component Value Date   TSH 0.86 11/16/2021   CBC    Component Value Date/Time   WBC 8.1 10/05/2022 1324   WBC 6.7 06/15/2022 0935   RBC 4.41 10/05/2022 1324   RBC 4.43 06/15/2022 0935   HGB 12.7 10/05/2022 1324   HCT 39.3 10/05/2022 1324  PLT 295 10/05/2022 1324   MCV 89 10/05/2022 1324   MCH 28.8 10/05/2022 1324   MCH 29.6 06/15/2022 0935   MCHC 32.3 10/05/2022 1324   MCHC 32.7 06/15/2022 0935   RDW 12.7 10/05/2022 1324   Iron Studies No results found for: "IRON", "TIBC", "FERRITIN", "IRONPCTSAT" Lipid Panel     Component Value Date/Time   CHOL 180 11/16/2021 0923   CHOL 186 01/05/2021 1040   TRIG 108.0 11/16/2021 0923   HDL 65.10 11/16/2021 0923   HDL 67 01/05/2021 1040   CHOLHDL 3 11/16/2021 0923   VLDL 21.6 11/16/2021 0923   LDLCALC 93 11/16/2021 0923   LDLCALC 95 01/05/2021 1040   LDLCALC 103 (H) 06/23/2020 1148   LDLDIRECT 112.0 01/22/2017 1559   Hepatic Function Panel     Component Value  Date/Time   PROT 6.6 10/05/2022 1324   ALBUMIN 4.0 10/05/2022 1324   AST 26 10/05/2022 1324   ALT 19 10/05/2022 1324   ALKPHOS 77 10/05/2022 1324   BILITOT 0.5 10/05/2022 1324   BILIDIR 0.1 06/23/2020 1148   BILIDIR 0.24 04/30/2020 1048   IBILI 0.7 06/23/2020 1148      Component Value Date/Time   TSH 0.86 11/16/2021 0923   Nutritional Lab Results  Component Value Date   VD25OH 79.10 11/16/2021   VD25OH 25.0 (L) 01/05/2021     ASSESSMENT AND PLAN  TREATMENT PLAN FOR OBESITY:  Recommended Dietary Goals  Lisa Acosta is currently in the action stage of change. As such, her goal is to continue weight management plan. She has agreed to restart the Category 1 Plan. Limit to one protein shake daily.    Behavioral Intervention  We discussed the following Behavioral Modification Strategies today: increasing lean protein intake, decreasing simple carbohydrates , increasing vegetables, increasing lower glycemic fruits, increasing fiber rich foods, avoiding skipping meals, increasing water intake, continue to practice mindfulness when eating, and planning for success.  Additional resources provided today: multiple handouts given today on Cat 1 meal plan, snacks, protein sources, etc  Recommended Physical Activity Goals  Lisa Acosta has been advised to work up to 150 minutes of moderate intensity aerobic activity a week and strengthening exercises 2-3 times per week for cardiovascular health, weight loss maintenance and preservation of muscle mass.   She has agreed to Think about ways to increase physical activity   ASSOCIATED CONDITIONS ADDRESSED TODAY  Action/Plan  Sarcoidosis Keep follow-up appointment with pulmonary.  Continue medications as directed.  Generalized obesity  BMI 32.0-32.9,adult         Return in about 4 weeks (around 01/29/2023).[name] She was informed of the importance of frequent follow up visits to maximize her success with intensive lifestyle modifications for her  multiple health conditions.   ATTESTASTION STATEMENTS:  Reviewed by clinician on day of visit: allergies, medications, problem list, medical history, surgical history, family history, social history, and previous encounter notes.   Time spent on visit including pre-visit chart review and post-visit care and charting was 30+ minutes.    Theodis Sato. Lisa Berrones FNP-C

## 2023-01-04 DIAGNOSIS — F331 Major depressive disorder, recurrent, moderate: Secondary | ICD-10-CM | POA: Diagnosis not present

## 2023-01-05 ENCOUNTER — Encounter (HOSPITAL_BASED_OUTPATIENT_CLINIC_OR_DEPARTMENT_OTHER): Payer: Self-pay | Admitting: Pulmonary Disease

## 2023-01-05 ENCOUNTER — Ambulatory Visit (INDEPENDENT_AMBULATORY_CARE_PROVIDER_SITE_OTHER): Payer: Medicare Other | Admitting: Pulmonary Disease

## 2023-01-05 VITALS — BP 120/86 | HR 59 | Temp 98.4°F | Ht 61.0 in | Wt 168.2 lb

## 2023-01-05 DIAGNOSIS — D869 Sarcoidosis, unspecified: Secondary | ICD-10-CM

## 2023-01-05 DIAGNOSIS — Z79899 Other long term (current) drug therapy: Secondary | ICD-10-CM | POA: Diagnosis not present

## 2023-01-05 DIAGNOSIS — Z5181 Encounter for therapeutic drug level monitoring: Secondary | ICD-10-CM

## 2023-01-05 DIAGNOSIS — R590 Localized enlarged lymph nodes: Secondary | ICD-10-CM

## 2023-01-05 LAB — COMPREHENSIVE METABOLIC PANEL
ALT: 35 IU/L — ABNORMAL HIGH (ref 0–32)
AST: 42 IU/L — ABNORMAL HIGH (ref 0–40)
Albumin/Globulin Ratio: 1.8 (ref 1.2–2.2)
Albumin: 4.4 g/dL (ref 3.8–4.8)
Alkaline Phosphatase: 91 IU/L (ref 44–121)
BUN/Creatinine Ratio: 27 (ref 12–28)
BUN: 23 mg/dL (ref 8–27)
Bilirubin Total: 0.8 mg/dL (ref 0.0–1.2)
CO2: 22 mmol/L (ref 20–29)
Calcium: 9.8 mg/dL (ref 8.7–10.3)
Chloride: 104 mmol/L (ref 96–106)
Creatinine, Ser: 0.85 mg/dL (ref 0.57–1.00)
Globulin, Total: 2.4 g/dL (ref 1.5–4.5)
Glucose: 104 mg/dL — ABNORMAL HIGH (ref 70–99)
Potassium: 4.8 mmol/L (ref 3.5–5.2)
Sodium: 141 mmol/L (ref 134–144)
Total Protein: 6.8 g/dL (ref 6.0–8.5)
eGFR: 73 mL/min/{1.73_m2} (ref >59)

## 2023-01-05 LAB — CBC WITH DIFFERENTIAL
Basophils Absolute: 0.1 10*3/uL (ref 0.0–0.2)
Basos: 1 %
EOS (ABSOLUTE): 0.2 10*3/uL (ref 0.0–0.4)
Eos: 2 %
Hematocrit: 41.4 % (ref 34.0–46.6)
Hemoglobin: 13.4 g/dL (ref 11.1–15.9)
Immature Grans (Abs): 0 10*3/uL (ref 0.0–0.1)
Immature Granulocytes: 0 %
Lymphocytes Absolute: 1.3 10*3/uL (ref 0.7–3.1)
Lymphs: 17 %
MCH: 30.2 pg (ref 26.6–33.0)
MCHC: 32.4 g/dL (ref 31.5–35.7)
MCV: 93 fL (ref 79–97)
Monocytes Absolute: 0.6 10*3/uL (ref 0.1–0.9)
Monocytes: 9 %
Neutrophils Absolute: 5.4 10*3/uL (ref 1.4–7.0)
Neutrophils: 71 %
RBC: 4.44 x10E6/uL (ref 3.77–5.28)
RDW: 13.6 % (ref 11.7–15.4)
WBC: 7.5 10*3/uL (ref 3.4–10.8)

## 2023-01-05 MED ORDER — METHOTREXATE SODIUM 2.5 MG PO TABS: 15.0000 mg | ORAL_TABLET | ORAL | 0 refills | Status: DC

## 2023-01-05 NOTE — Patient Instructions (Addendum)
   High risk medication management --Off steroids --Reduce methotrexate to 6 tablets (15 mg) weekly --Continue folic acid 2 mg daily --ORDER CBC with diff, CMP today and next month

## 2023-01-05 NOTE — Progress Notes (Signed)
Subjective:   PATIENT ID: Lisa Acosta GENDER: female DOB: 1951/07/13, MRN: [id number]  Chief Complaint  Patient presents with   Follow-up    Follow up. Patient is having side effects from methotrexate. Patient also wan to check on blood work she was supposed to have done.     Reason for Visit: Follow-up  Lisa Acosta is a 72 year old female never smoker with pulmonary sarcoid GERD, HTN, HLD, atrial fibrillation who presents for follow-up.  Initial consult 10/05/22 She was previously seen by Dr. Tonia Brooms for pulmonary sarcoid and started on steroids, prednisone 30 mg daily. Since starting this she has had 20 lb weight gain and headaches, irritiability and darker stool per 09/18/22 telephone note. She was advised to hold. Her cough has returned and is improved but persistent. Cough had initially started six months ago.  She reports significant fatigue and prior sleep studies have been negative. She is wearing oxygen nightly.  01/05/23 Since our last visit she was started on methotrexate in Feb 11/04/22. Currently on 8 tablets (20 mg weekly). She is still feeling fatigued and tired. She is having confusion versus starting methotrexate. Unsure if this is related to the Alzheimer's. Her cough has improved but occasional. Denies shortness of breath or wheezing.  Social History: Never smoker. 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   Pre-diabetes      Family History  Problem Relation Age of Onset   Atrial fibrillation Mother    Congestive Heart Failure Mother    Depression Mother    Heart disease Mother    Stroke Mother    Thyroid disease Mother    Anxiety disorder Mother    Obesity Mother    Heart disease Father    Alcohol abuse Father    High blood pressure Father    High Cholesterol Father    Alcoholism Father    Depression Sister    Heart disease Brother    Heart disease Brother    Depression Daughter      Social History   Occupational History   Occupation: Retired    Associate Professor: OTHER    Comment: Administrator, [employer]  Tobacco Use   Smoking status: Never    Passive exposure: Current (doesn't smoke in house now or past 77yrs)   Smokeless tobacco: Never  Vaping Use   Vaping Use: Never used  Substance and Sexual Activity   Alcohol use: Yes    Comment: rare   Drug use: No   Sexual activity: Not on file    Allergies  Allergen Reactions   Dextran Anaphylaxis   Tree Extract    Penicillin G Rash   Penicillins Rash   Sulfa Antibiotics Rash and Other (See Comments)   Wound Dressing Adhesive Rash     Outpatient Medications Prior to  Visit  Medication Sig Dispense Refill   alendronate (FOSAMAX) 70 MG tablet TAKE 1 TABLET BY MOUTH EVERY 7 DAYS, TAKE WITH A FULL GLASS OF WATER ON AN EMPTY STOMACH (Patient taking differently: Take 70 mg by mouth every Sunday.) 12 tablet 3   amLODipine (NORVASC) 5 MG tablet TAKE 1 TABLET (5 MG TOTAL) BY MOUTH DAILY. 90 tablet 0   atenolol (TENORMIN) 50 MG tablet TAKE 1 TABLET BY MOUTH EVERY DAY 90 tablet 0   benzonatate (TESSALON) 200 MG capsule Take 1 capsule (200 mg total) by mouth 3 (three) times daily as needed for cough. 30 capsule 1   Calcium Carb-Cholecalciferol (CALCIUM 600 + D PO) Take 1 tablet by mouth in the morning and at bedtime.     cyclobenzaprine (FLEXERIL) 10 MG tablet TAKE 1 TABLET BY MOUTH TWICE A DAY AS NEEDED FOR MUSCLE SPASMS 30  tablet 0   flecainide (TAMBOCOR) 100 MG tablet Take 1 tablet (100 mg total) by mouth 2 (two) times daily. 180 tablet 3   FLUoxetine (PROZAC) 40 MG capsule Take 1 capsule (40 mg total) by mouth 2 (two) times daily. 180 capsule 3   folic acid (FOLVITE) 1 MG tablet Take 2 tablets (2 mg total) by mouth daily. 180 tablet 3   HYDROcodone-acetaminophen (NORCO/VICODIN) 5-325 MG tablet Take 2 tablets by mouth every 4 (four) hours as needed. 30 tablet 0   Krill Oil 500 MG CAPS Take 500 mg by mouth daily.     levothyroxine (SYNTHROID) 125 MCG tablet TAKE 1 TABLET BY MOUTH DAILY BEFORE BREAKFAST. 90 tablet 3   magnesium oxide (MAG-OX) 400 (240 Mg) MG tablet Take 400 mg by mouth daily.     Multiple Vitamins-Minerals (PRESERVISION AREDS 2+MULTI VIT PO) Take 1 capsule by mouth daily.     omeprazole (PRILOSEC) 40 MG capsule TAKE 1 CAPSULE (40 MG TOTAL) BY MOUTH IN THE MORNING AND AT BEDTIME. 180 capsule 0   Propylene Glycol (SYSTANE BALANCE) 0.6 % SOLN Place 1 drop into both eyes as needed (dry eyes).     rivaroxaban (XARELTO) 20 MG TABS tablet Take 1 tablet (20 mg total) by mouth daily with supper. 14 tablet 0   rosuvastatin (CRESTOR) 20 MG tablet TAKE 1 TABLET BY MOUTH EVERY DAY 90 tablet 3   traZODone (DESYREL) 50 MG tablet TAKE 1.5 TABLETS BY MOUTH AT BEDTIME. 135 tablet 3   XARELTO 20 MG TABS tablet TAKE 1 TABLET BY MOUTH EVERY DAY 90 tablet 3   methotrexate (RHEUMATREX) 2.5 MG tablet Take 4 tabs once weekly x 2 wks. Then 5 tabs weekly x 2 wks. Then 6 tabs weekly x  2 wks. Then 8 tabs weekly thereafter. 78 tablet 0   No facility-administered medications prior to visit.    Review of Systems  Constitutional:  Negative for chills, diaphoresis, fever, malaise/fatigue and weight loss.  HENT:  Negative for congestion.   Respiratory:  Positive for cough. Negative for hemoptysis, sputum production, shortness of breath and wheezing.   Cardiovascular:  Negative for chest pain, palpitations and leg swelling.      Objective:   Vitals:   01/05/23 1011  BP: 120/86  Pulse: (!) 59  Temp: 98.4 F (36.9 C)  TempSrc: Oral  SpO2: 93%  Weight: 168 lb 3.2 oz (76.3 kg)  Height: 5\' 1"  (1.549 m)   SpO2: 93 % O2 Device: None (Room air)  Physical Exam: General: Well-appearing, no acute distress HENT: [place], AT Eyes: EOMI, no scleral icterus Respiratory: Clear to auscultation bilaterally.  No crackles, wheezing or rales Cardiovascular: RRR, -M/R/G, no JVD Extremities:-Edema,-tenderness Neuro: AAO x4, CNII-XII grossly intact Psych: Normal mood, normal affect  Data Reviewed:  Imaging: PET 06/07/22 - Hypermetabolic masslike consolidation in LUL, hypermetabolic left supraclavicular, hilar and mediastinal adenopathy. RUL nodule 12 mm new CT Super D 06/26/22 - LUL lung mass with GGO. RUL nodule resolved CT Chest 08/14/22 - Stable LUL focal opacity with surrounding GGO, new reticular opacities in lung bases, stable left supraclavicular, left hilar and prevascular lymph nodes  PFT: 08/29/16  FVC 2.63 (95%) FEV1 2.21 (105%) Ratio 84  TLC 91% DLCO 75% Interpretation: Normal spirometry and lung volumes. Mildly reduced DLCO.  10/05/22 FVC 2.37 (92%) FEV1 2.02 (104%) Ratio 85  TLC 87% DLCO 65% Interpretation: Normal spirometry and lung volumes. Compared to prior reduced DLCO, remains mild   Labs:    Latest Ref Rng & Units 10/05/2022    1:24 PM 06/15/2022    9:35 AM 04/06/2022    5:08 PM  CBC  WBC 3.4 - 10.8 x10E3/uL 8.1  6.7  7.8   Hemoglobin 11.1 - 15.9 g/dL 16.1  09.6  04.5   Hematocrit 34.0 - 46.6 % 39.3  40.1  40.2   Platelets 150 - 450 x10E3/uL 295  239  231       Latest Ref Rng & Units 10/05/2022    1:24 PM 06/15/2022    9:35 AM 04/06/2022    5:08 PM  CMP  Glucose 70 - 99 mg/dL 409  811  95   BUN 8 - 27 mg/dL 25  15  26    Creatinine 0.57 - 1.00 mg/dL 9.14  7.82  9.56   Sodium 134 - 144 mmol/L 139  137  138   Potassium 3.5 - 5.2 mmol/L 4.1  3.9  3.9   Chloride 96 - 106 mmol/L 100  107   107   CO2 20 - 29 mmol/L 22  23  24    Calcium 8.7 - 10.3 mg/dL 9.5  8.6  9.2   Total Protein 6.0 - 8.5 g/dL 6.6     Total Bilirubin 0.0 - 1.2 mg/dL 0.5     Alkaline Phos 44 - 121 IU/L 77     AST 0 - 40 IU/L 26     ALT 0 - 32 IU/L 19      Pathology: 06/29/22 LUL Needle, endobronchial biopsy- Numerous noncaseating granulomas  Sleep: PSG 03/30/22 - No sleep apnea. Nocturnal hypoxemia  ONO 08/11/23 SpO2 <88% 1 hour and 7 min. Average SpO2 89%. Nadir SpO2 72% Interpretation: Qualifies for oxygen     Assessment & Plan:   Discussion: 72 year old female never smoker with pulmonary sarcoid GERD, HTN, HLD, atrial fibrillation who presents for follow-up  Improved cough on methotrexate. However having some new confusion  We discussed the clinical course of sarcoid and management including serial PFTs, labs, eye exam, and EKG and chest imaging if indicated.   Adverse effects of steroids discussed including bruising, weight gain, fluid retention, Cushingoid features, hypertension, cardiac arrhythmias, osteoporosis, gastric ulcers, increased risk of infection, insomnia and hyperglycemia.  Reviewed risks and benefits of methotrexate therapy. Possible adverse effects including but not limited to increased risk of infection, liver toxicity, bone marrow suppression, pneumonitis and oral ulcers.  Confusion May be medication related in setting of mild dementia --Reduced methotrexate as noted below  Pulmonary sarcoidosis - active flare Chronic cough --Dx in 05/29/22 and 06/15/22 via endobronchial bx --PET/CT 06/07/22 Hypermetabolic masslike consolidation with mediastinal/hilar adenopathy --ORDER CT  Chest without contrast in June 2024 to assess immunosuppression response  History of immunosuppression High risk medication management --06/2022-09/2022 Prednisone 30 mg. Discontinued d/t agitation, HA. No taper --Off steroids --Reduce methotrexate to 6 tablets (15 mg) weekly --Continue folic acid 2  mg daily --ORDER CBC with diff, CMP today and next month  Sarcoid Monitoring --Recent chest imaging reviewed. Plan for repeat imaging after on stable methotrexate dosing --Annual PFTs.  Last PFTs 09/2022. Mildly reduced DLCO --Annual ophthalmology exam. Daughter will schedule --Followed by EP at [facility] --Routine labs as needed: CBC with diff, CMET, 1, 25 and 25 hydroxy vitamin D, urinary calcium  Nocturnal hypoxemia Continue oxygen 2L nightly  Health Maintenance Immunization History  Administered Date(s) Administered   Fluad Quad(high Dose 65+) 05/02/2019, 06/23/2020, 06/30/2021, 05/29/2022   Influenza, High Dose Seasonal PF 06/14/2016, 05/15/2018   Influenza,inj,Quad PF,6+ Mos 06/11/2014   Influenza,inj,quad, With Preservative 06/11/2017   PFIZER(Purple Top)SARS-COV-2 Vaccination 10/03/2019, 10/24/2019, 06/17/2020, 03/16/2021   Pfizer Covid-19 Vaccine Bivalent Booster 58yrs & up 08/02/2021   Pneumococcal Conjugate-13 08/07/2016   Pneumococcal Polysaccharide-23 01/28/2018   Tdap 01/28/2018   CT Lung Screen - not qualified. Serial imaging as noted above  Orders Placed This Encounter  Procedures   CT Chest W Contrast    Standing Status:   Future    Standing Expiration Date:   01/05/2024    Scheduling Instructions:     Schedule in 1st full week in June. Please ensure CT follow up appointment with me    Order Specific Question:   If indicated for the ordered procedure, I authorize the administration of contrast media per Radiology protocol    Answer:   Yes    Order Specific Question:   Does the patient have a contrast media/X-ray dye allergy?    Answer:   No    Order Specific Question:   Preferred imaging location?    Answer:   [facility]   CBC With Differential   Comp Met (CMET)   Meds ordered this encounter  Medications   methotrexate (RHEUMATREX) 2.5 MG tablet    Sig: Take 6 tablets (15 mg total) by mouth once a week.    Dispense:  72 tablet    Refill:  0     Return in about 2 months (around 03/07/2023).  I have spent a total time of 60-minutes on the day of the appointment including chart review, data review, collecting history, coordinating care and discussing medical diagnosis and plan with the patient/family. Past medical history, allergies, medications were reviewed. Pertinent imaging, labs and tests included in this note have been reviewed and interpreted independently by me.  Katilin Raynes Mechele Collin, MD [facility] 01/05/2023 2:19 PM  Office Number [phone]

## 2023-01-07 NOTE — Progress Notes (Unsigned)
Healthcare at [facility] [address] [place], [place] [address] [phone]  Date:  01/10/2023   Name:  Lisa Acosta   DOB:  12/19/1950   MRN:  [id number]  PCP:  Pearline Cables, MD    Chief Complaint: No chief complaint on file.   History of Present Illness:  Lisa Acosta is a 72 y.o. very pleasant female patient who presents with the following:  Patient seen today for periodic follow-up-history of GERD, hypertension, hyperlipidemia, atrial fibrillation, anxiety and depression and recently diagnosed sarcoidosis Most recent visit with myself was in July other we have exchanged several MyChart messages since then At our visit in July she was having some chest pain, thought probably due to esophageal ulcer but given active chest pain she was referred to the emergency department. She ended up being diagnosed with pneumonia versus mass on CT chest-PET scan was also concerning  She was seen by pulmonology in September-the did a bronchoscopy in October-results were concerning, she then underwent a repeat robotic assisted bronchoscopy later in October; ended up being diagnosed with sarcoidosis  She was seen just recently by pulmonology, Dr. Everardo All on April 26 She started on methotrexate in February She is now off steroids, methotrexate was recently reduced to 15 mg weekly  She has been seen by psychology through [facility] for depression Patient Active Problem List   Diagnosis Date Noted   Generalized obesity 10/12/2022   Class 2 severe obesity with serious comorbidity and body mass index (BMI) of 35.0 to 35.9 in adult (HCC) 08/15/2022   Lung infiltrate    Persistent pneumonia    Hilar adenopathy    Vitamin D insufficiency 01/10/2021   Memory difficulty 08/18/2019   Right wrist pain 05/04/2019   Mild episode of recurrent major depressive disorder (HCC) 12/11/2018   Depression, major, single episode, mild  (HCC) 10/22/2018   Gait abnormality 10/14/2018   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   Moderate episode of recurrent major depressive disorder (HCC) 09/26/2018   Generalized anxiety disorder 09/26/2018   Osteoporosis 01/25/2018   Osteoarthritis of hands, 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   Dyspnea 09/22/2016   OSA (obstructive sleep apnea) 08/31/2016   PAF (paroxysmal atrial fibrillation) (HCC) 03/16/2016   Hypothyroid 03/16/2016   Obesity (BMI 30-39.9) 03/16/2016   Essential hypertension 11/10/2015   Hyperlipidemia 11/10/2015    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   Pre-diabetes     Past Surgical History:  Procedure Laterality Date   BREAST BIOPSY Left    needle core biopsy, benign   BRONCHIAL BIOPSY  06/15/2022   Procedure: BRONCHIAL BIOPSIES;  Surgeon: Charlott Holler, MD;  Location: [facility] ENDOSCOPY;  Service: Pulmonary;;   BRONCHIAL BIOPSY  06/29/2022   Procedure: BRONCHIAL BIOPSIES;  Surgeon: Josephine Igo, DO;  Location: [facility]  ENDOSCOPY;  Service: Pulmonary;;   BRONCHIAL NEEDLE ASPIRATION BIOPSY  06/15/2022   Procedure: BRONCHIAL NEEDLE ASPIRATION BIOPSIES;  Surgeon: Charlott Holler, MD;  Location: [facility] ENDOSCOPY;  Service: Pulmonary;;   BRONCHIAL NEEDLE ASPIRATION BIOPSY  06/29/2022   Procedure: BRONCHIAL NEEDLE ASPIRATION BIOPSIES;  Surgeon: Josephine Igo, DO;  Location: [facility] ENDOSCOPY;  Service: Pulmonary;;   BRONCHIAL WASHINGS  06/15/2022    Procedure: BRONCHIAL WASHINGS;  Surgeon: Charlott Holler, MD;  Location: [facility] ENDOSCOPY;  Service: Pulmonary;;   CATARACT EXTRACTION     DILATION AND CURETTAGE OF UTERUS     EYE SURGERY     HAND SURGERY     KNEE SURGERY     ROTATOR CUFF REPAIR     TOTAL ABDOMINAL HYSTERECTOMY     VIDEO BRONCHOSCOPY N/A 06/15/2022   Procedure: VIDEO BRONCHOSCOPY WITH FLUORO;  Surgeon: Charlott Holler, MD;  Location: [facility] ENDOSCOPY;  Service: Pulmonary;  Laterality: N/A;   VIDEO BRONCHOSCOPY WITH ENDOBRONCHIAL ULTRASOUND  06/15/2022   Procedure: VIDEO BRONCHOSCOPY WITH ENDOBRONCHIAL ULTRASOUND;  Surgeon: Charlott Holler, MD;  Location: [facility] ENDOSCOPY;  Service: Pulmonary;;   VIDEO BRONCHOSCOPY WITH ENDOBRONCHIAL ULTRASOUND N/A 06/29/2022   Procedure: VIDEO BRONCHOSCOPY WITH ENDOBRONCHIAL ULTRASOUND;  Surgeon: Josephine Igo, DO;  Location: [facility] ENDOSCOPY;  Service: Pulmonary;  Laterality: N/A;    Social History   Tobacco Use   Smoking status: Never    Passive exposure: Current (doesn't smoke in house now or past 60yrs)   Smokeless tobacco: Never  Vaping Use   Vaping Use: Never used  Substance Use Topics   Alcohol use: Yes    Comment: rare   Drug use: No    Family History  Problem Relation Age of Onset   Atrial fibrillation Mother    Congestive Heart Failure Mother    Depression Mother    Heart disease Mother    Stroke Mother    Thyroid disease Mother    Anxiety disorder Mother    Obesity Mother    Heart disease Father    Alcohol abuse Father    High blood pressure Father    High Cholesterol Father    Alcoholism Father    Depression Sister    Heart disease Brother    Heart disease Brother    Depression Daughter     Allergies  Allergen Reactions   Dextran Anaphylaxis   Tree Extract    Penicillin G Rash   Penicillins Rash   Sulfa Antibiotics Rash and Other (See Comments)   Wound Dressing Adhesive Rash    Medication list has been reviewed and updated.  Current Outpatient Medications  on File Prior to Visit  Medication Sig Dispense Refill   alendronate (FOSAMAX) 70 MG tablet TAKE 1 TABLET BY MOUTH EVERY 7 DAYS, TAKE WITH A FULL GLASS OF WATER ON AN EMPTY STOMACH (Patient taking differently: Take 70 mg by mouth every Sunday.) 12 tablet 3   amLODipine (NORVASC) 5 MG tablet TAKE 1 TABLET (5 MG TOTAL) BY MOUTH DAILY. 90 tablet 0   atenolol (TENORMIN) 50 MG tablet TAKE 1 TABLET BY MOUTH EVERY DAY 90 tablet 0   benzonatate (TESSALON) 200 MG capsule Take 1 capsule (200 mg total) by mouth 3 (three) times daily as needed for cough. 30 capsule 1   Calcium Carb-Cholecalciferol (CALCIUM 600 + D PO) Take 1 tablet by mouth in the morning and at bedtime.     cyclobenzaprine (FLEXERIL) 10 MG tablet TAKE 1 TABLET BY MOUTH TWICE A DAY AS NEEDED FOR  MUSCLE SPASMS 30 tablet 0   flecainide (TAMBOCOR) 100 MG tablet Take 1 tablet (100 mg total) by mouth 2 (two) times daily. 180 tablet 3   FLUoxetine (PROZAC) 40 MG capsule Take 1 capsule (40 mg total) by mouth 2 (two) times daily. 180 capsule 3   folic acid (FOLVITE) 1 MG tablet Take 2 tablets (2 mg total) by mouth daily. 180 tablet 3   HYDROcodone-acetaminophen (NORCO/VICODIN) 5-325 MG tablet Take 2 tablets by mouth every 4 (four) hours as needed. 30 tablet 0   Krill Oil 500 MG CAPS Take 500 mg by mouth daily.     levothyroxine (SYNTHROID) 125 MCG tablet TAKE 1 TABLET BY MOUTH DAILY BEFORE BREAKFAST. 90 tablet 3   magnesium oxide (MAG-OX) 400 (240 Mg) MG tablet Take 400 mg by mouth daily.     methotrexate (RHEUMATREX) 2.5 MG tablet Take 6 tablets (15 mg total) by mouth once a week. 72 tablet 0   Multiple Vitamins-Minerals (PRESERVISION AREDS 2+MULTI VIT PO) Take 1 capsule by mouth daily.     omeprazole (PRILOSEC) 40 MG capsule TAKE 1 CAPSULE (40 MG TOTAL) BY MOUTH IN THE MORNING AND AT BEDTIME. 180 capsule 0   Propylene Glycol (SYSTANE BALANCE) 0.6 % SOLN Place 1 drop into both eyes as needed (dry eyes).     rivaroxaban (XARELTO) 20 MG TABS tablet  Take 1 tablet (20 mg total) by mouth daily with supper. 14 tablet 0   rosuvastatin (CRESTOR) 20 MG tablet TAKE 1 TABLET BY MOUTH EVERY DAY 90 tablet 3   traZODone (DESYREL) 50 MG tablet TAKE 1.5 TABLETS BY MOUTH AT BEDTIME. 135 tablet 3   XARELTO 20 MG TABS tablet TAKE 1 TABLET BY MOUTH EVERY DAY 90 tablet 3   No current facility-administered medications on file prior to visit.    Review of Systems:  As per HPI- otherwise negative.   Physical Examination: There were no vitals filed for this visit. There were no vitals filed for this visit. There is no height or weight on file to calculate BMI. Ideal Body Weight:    GEN: no acute distress. HEENT: Atraumatic, Normocephalic.  Ears and Nose: No external deformity. CV: RRR, No M/G/R. No JVD. No thrill. No extra heart sounds. PULM: CTA B, no wheezes, crackles, rhonchi. No retractions. No resp. distress. No accessory muscle use. ABD: S, NT, ND, +BS. No rebound. No HSM. EXTR: No c/c/e PSYCH: Normally interactive. Conversant.    Assessment and Plan: ***  Signed Abbe Amsterdam, MD

## 2023-01-09 DIAGNOSIS — M5451 Vertebrogenic low back pain: Secondary | ICD-10-CM | POA: Diagnosis not present

## 2023-01-09 DIAGNOSIS — M791 Myalgia, unspecified site: Secondary | ICD-10-CM | POA: Diagnosis not present

## 2023-01-10 ENCOUNTER — Ambulatory Visit (INDEPENDENT_AMBULATORY_CARE_PROVIDER_SITE_OTHER): Payer: Medicare Other | Admitting: Family Medicine

## 2023-01-10 ENCOUNTER — Encounter (HOSPITAL_BASED_OUTPATIENT_CLINIC_OR_DEPARTMENT_OTHER): Payer: Self-pay | Admitting: Pulmonary Disease

## 2023-01-10 VITALS — BP 124/78 | HR 60 | Temp 98.3°F | Resp 18 | Ht 61.0 in | Wt 170.6 lb

## 2023-01-10 DIAGNOSIS — E7800 Pure hypercholesterolemia, unspecified: Secondary | ICD-10-CM | POA: Diagnosis not present

## 2023-01-10 DIAGNOSIS — R5383 Other fatigue: Secondary | ICD-10-CM

## 2023-01-10 DIAGNOSIS — R6889 Other general symptoms and signs: Secondary | ICD-10-CM | POA: Diagnosis not present

## 2023-01-10 DIAGNOSIS — Z5181 Encounter for therapeutic drug level monitoring: Secondary | ICD-10-CM | POA: Diagnosis not present

## 2023-01-10 DIAGNOSIS — F320 Major depressive disorder, single episode, mild: Secondary | ICD-10-CM

## 2023-01-10 DIAGNOSIS — E038 Other specified hypothyroidism: Secondary | ICD-10-CM | POA: Diagnosis not present

## 2023-01-10 DIAGNOSIS — R7303 Prediabetes: Secondary | ICD-10-CM | POA: Diagnosis not present

## 2023-01-10 DIAGNOSIS — I480 Paroxysmal atrial fibrillation: Secondary | ICD-10-CM | POA: Diagnosis not present

## 2023-01-10 MED ORDER — FLUOXETINE HCL 40 MG PO CAPS: 40.0000 mg | ORAL_CAPSULE | Freq: Two times a day (BID) | ORAL | 3 refills | Status: DC

## 2023-01-10 NOTE — Patient Instructions (Addendum)
It was great to see you today- I am glad you are doing well!  I will be in touch with your labs asap  Consider shingrix series and a covid booster if not done in the last 9 months or so

## 2023-01-11 ENCOUNTER — Encounter: Payer: Self-pay | Admitting: Family Medicine

## 2023-01-11 LAB — HEMOGLOBIN A1C: Hgb A1c MFr Bld: 5.7 % (ref 4.6–6.5)

## 2023-01-11 LAB — LIPID PANEL
Cholesterol: 151 mg/dL (ref 0–200)
HDL: 66.7 mg/dL (ref >39.00)
LDL Cholesterol: 70 mg/dL (ref 0–99)
NonHDL: 84.77
Total CHOL/HDL Ratio: 2
Triglycerides: 74 mg/dL (ref 0.0–149.0)
VLDL: 14.8 mg/dL (ref 0.0–40.0)

## 2023-01-11 LAB — TSH: TSH: 1.13 u[IU]/mL (ref 0.35–5.50)

## 2023-01-12 NOTE — Telephone Encounter (Signed)
Dr. Everardo All, please advise on pt's message. She is requesting a POC. Thanks.

## 2023-01-18 ENCOUNTER — Telehealth (HOSPITAL_BASED_OUTPATIENT_CLINIC_OR_DEPARTMENT_OTHER): Payer: Self-pay | Admitting: Pulmonary Disease

## 2023-01-18 NOTE — Telephone Encounter (Signed)
Called and spoke with patient.  Nothing further needed. 

## 2023-01-18 NOTE — Telephone Encounter (Signed)
Pt. Needs med advise about her portable tank and she need to know today getting on plane at 4:30am tomorrow

## 2023-01-21 ENCOUNTER — Other Ambulatory Visit: Payer: Self-pay | Admitting: Family Medicine

## 2023-02-10 ENCOUNTER — Ambulatory Visit (HOSPITAL_BASED_OUTPATIENT_CLINIC_OR_DEPARTMENT_OTHER)
Admission: RE | Admit: 2023-02-10 | Discharge: 2023-02-10 | Disposition: A | Discharge status: Home / Self Care | Payer: Medicare Other | Source: Ambulatory Visit | Attending: Pulmonary Disease | Admitting: Pulmonary Disease

## 2023-02-10 DIAGNOSIS — R590 Localized enlarged lymph nodes: Secondary | ICD-10-CM

## 2023-02-10 DIAGNOSIS — Z5181 Encounter for therapeutic drug level monitoring: Secondary | ICD-10-CM | POA: Diagnosis not present

## 2023-02-10 DIAGNOSIS — D869 Sarcoidosis, unspecified: Secondary | ICD-10-CM

## 2023-02-10 DIAGNOSIS — R911 Solitary pulmonary nodule: Secondary | ICD-10-CM | POA: Diagnosis not present

## 2023-02-10 DIAGNOSIS — J181 Lobar pneumonia, unspecified organism: Secondary | ICD-10-CM | POA: Diagnosis not present

## 2023-02-10 MED ORDER — IOHEXOL 300 MG/ML  SOLN
75.0000 mL | Freq: Once | INTRAMUSCULAR | Status: AC | PRN
  Administered 2023-02-10: 75 mL via INTRAVENOUS

## 2023-02-28 DIAGNOSIS — F331 Major depressive disorder, recurrent, moderate: Secondary | ICD-10-CM | POA: Diagnosis not present

## 2023-03-06 ENCOUNTER — Encounter: Payer: Self-pay | Admitting: Nurse Practitioner

## 2023-03-06 ENCOUNTER — Ambulatory Visit (INDEPENDENT_AMBULATORY_CARE_PROVIDER_SITE_OTHER): Payer: Medicare Other | Admitting: Nurse Practitioner

## 2023-03-06 VITALS — BP 127/72 | HR 60 | Temp 98.1°F | Ht 61.0 in | Wt 166.0 lb

## 2023-03-06 DIAGNOSIS — R32 Unspecified urinary incontinence: Secondary | ICD-10-CM | POA: Diagnosis not present

## 2023-03-06 DIAGNOSIS — Z6831 Body mass index (BMI) 31.0-31.9, adult: Secondary | ICD-10-CM | POA: Diagnosis not present

## 2023-03-06 DIAGNOSIS — F320 Major depressive disorder, single episode, mild: Secondary | ICD-10-CM

## 2023-03-06 DIAGNOSIS — E669 Obesity, unspecified: Secondary | ICD-10-CM

## 2023-03-06 NOTE — Progress Notes (Signed)
Office: [phone]  /  Fax: [phone]  WEIGHT SUMMARY AND BIOMETRICS  Weight Lost Since Last Visit: 4lb  No data recorded  Vitals Temp: 98.1 F (36.7 C) BP: 127/72 Pulse Rate: 60 SpO2: 96 %   Anthropometric Measurements Height: 5\' 1"  (1.549 m) Weight: 166 lb (75.3 kg) BMI (Calculated): 31.38 Weight at Last Visit: 170lb Weight Lost Since Last Visit: 4lb Starting Weight: 168lb Total Weight Loss (lbs): 2 lb (0.907 kg)   Body Composition  Body Fat %: 45.8 % Fat Mass (lbs): 76.4 lbs Muscle Mass (lbs): 85.8 lbs Total Body Water (lbs): 62.4 lbs Visceral Fat Rating : 13   Other Clinical Data Fasting: Yes Labs: No Today's Visit #: 21 Starting Date: 01/05/21     HPI  Chief Complaint: OBESITY  Lisa Acosta is here to discuss her progress with her obesity treatment plan. Lisa Acosta is on the practicing portion control and making smarter food choices, such as increasing vegetables and decreasing simple carbohydrates and states Lisa Acosta is following her eating plan approximately 70 % of the time. Lisa Acosta states Lisa Acosta is exercising 0 minutes 0 days per week.   Interval History:  Since last office visit Lisa Acosta has lost 4 pounds. Lisa Acosta went to [place] since her last visit.  Lisa Acosta is scheduled next week with ortho due to right knee pain.  Lisa Acosta is walking with the aid of a cane.  Lisa Acosta hasn't been able to exercise as Lisa Acosta would like to due to knee pain. Lisa Acosta is not drinking enough water due to incontinence that has gotten worse since December.    Depression  Lisa Acosta hasn't been seeing her therapist and has been struggling more with her depression.  Plans to restart seeing her therapist on a regular basis.  Lisa Acosta is currently taking Prozac 40mg .  Was prescribed Trintellix but wasn't able to take it due to cost.  No suicidal thoughts.      PHYSICAL EXAM:  Blood pressure 127/72, pulse 60, temperature 98.1 F (36.7 C), height 5\' 1"  (1.549 m), weight 166 lb (75.3 kg), SpO2 96 %. Body mass index is 31.37  kg/m.  General: Lisa Acosta is overweight, cooperative, alert, well developed, and in no acute distress. PSYCH: Has normal mood, affect and thought process.   Extremities: No edema.  Neurologic: No gross sensory or motor deficits. No tremors or fasciculations noted.    DIAGNOSTIC DATA REVIEWED:  BMET    Component Value Date/Time   NA 141 01/05/2023 1104   K 4.8 01/05/2023 1104   CL 104 01/05/2023 1104   CO2 22 01/05/2023 1104   GLUCOSE 104 (H) 01/05/2023 1104   GLUCOSE 120 (H) 06/15/2022 0935   BUN 23 01/05/2023 1104   CREATININE 0.85 01/05/2023 1104   CALCIUM 9.8 01/05/2023 1104   GFRNONAA >60 06/15/2022 0935   GFRAA >60 09/24/2019 1224   Lab Results  Component Value Date   HGBA1C 5.7 01/10/2023   HGBA1C 6.0 01/22/2017   Lab Results  Component Value Date   INSULIN 20.4 01/05/2021   Lab Results  Component Value Date   TSH 1.13 01/10/2023   CBC    Component Value Date/Time   WBC 7.5 01/05/2023 1104   WBC 6.7 06/15/2022 0935   RBC 4.44 01/05/2023 1104   RBC 4.43 06/15/2022 0935   HGB 13.4 01/05/2023 1104   HCT 41.4 01/05/2023 1104   PLT 295 10/05/2022 1324   MCV 93 01/05/2023 1104   MCH 30.2 01/05/2023 1104   MCH 29.6 06/15/2022 0935   MCHC 32.4 01/05/2023  1104   MCHC 32.7 06/15/2022 0935   RDW 13.6 01/05/2023 1104   Iron Studies No results found for: "IRON", "TIBC", "FERRITIN", "IRONPCTSAT" Lipid Panel     Component Value Date/Time   CHOL 151 01/10/2023 1430   CHOL 186 01/05/2021 1040   TRIG 74.0 01/10/2023 1430   HDL 66.70 01/10/2023 1430   HDL 67 01/05/2021 1040   CHOLHDL 2 01/10/2023 1430   VLDL 14.8 01/10/2023 1430   LDLCALC 70 01/10/2023 1430   LDLCALC 95 01/05/2021 1040   LDLCALC 103 (H) 06/23/2020 1148   LDLDIRECT 112.0 01/22/2017 1559   Hepatic Function Panel     Component Value Date/Time   PROT 6.8 01/05/2023 1104   ALBUMIN 4.4 01/05/2023 1104   AST 42 (H) 01/05/2023 1104   ALT 35 (H) 01/05/2023 1104   ALKPHOS 91 01/05/2023 1104    BILITOT 0.8 01/05/2023 1104   BILIDIR 0.1 06/23/2020 1148   BILIDIR 0.24 04/30/2020 1048   IBILI 0.7 06/23/2020 1148      Component Value Date/Time   TSH 1.13 01/10/2023 1430   Nutritional Lab Results  Component Value Date   VD25OH 79.10 11/16/2021   VD25OH 25.0 (L) 01/05/2021     ASSESSMENT AND PLAN  TREATMENT PLAN FOR OBESITY:  Recommended Dietary Goals  Lisa Acosta is currently in the action stage of change. As such, her goal is to continue weight management plan. Lisa Acosta has agreed to practicing portion control and making smarter food choices, such as increasing vegetables and decreasing simple carbohydrates.  Behavioral Intervention  We discussed the following Behavioral Modification Strategies today: increasing lean protein intake, decreasing simple carbohydrates , increasing vegetables, increasing lower glycemic fruits, increasing fiber rich foods, avoiding skipping meals, increasing water intake, continue to practice mindfulness when eating, and planning for success.  Additional resources provided today: NA  Recommended Physical Activity Goals  Lisa Acosta has been advised to work up to 150 minutes of moderate intensity aerobic activity a week and strengthening exercises 2-3 times per week for cardiovascular health, weight loss maintenance and preservation of muscle mass.   Lisa Acosta has agreed to Unable to participate in physical activity at present due to medical conditions    ASSOCIATED CONDITIONS ADDRESSED TODAY  Action/Plan  Depression, major, single episode, mild (HCC) Plans to continue seeing her therapist on a regular basis.  To discuss medications with PCP.  To let someone know if Lisa Acosta ever feels like life is not worth living.  Urinary incontinence, unspecified type Discussed treatment options today.  Patient would like to make an appointment and discuss with her PCP.  Generalized obesity  BMI 31.0-31.9,adult         Return in about 4 weeks (around 04/03/2023).[name] Lisa Acosta  was informed of the importance of frequent follow up visits to maximize her success with intensive lifestyle modifications for her multiple health conditions.   ATTESTASTION STATEMENTS:  Reviewed by clinician on day of visit: allergies, medications, problem list, medical history, surgical history, family history, social history, and previous encounter notes.   Time spent on visit including pre-visit chart review and post-visit care and charting was 30 minutes.    Theodis Sato. Lasheika Ortloff FNP-C

## 2023-03-08 ENCOUNTER — Other Ambulatory Visit (HOSPITAL_BASED_OUTPATIENT_CLINIC_OR_DEPARTMENT_OTHER): Payer: Self-pay

## 2023-03-08 ENCOUNTER — Ambulatory Visit (INDEPENDENT_AMBULATORY_CARE_PROVIDER_SITE_OTHER): Payer: Medicare Other | Admitting: Pulmonary Disease

## 2023-03-08 ENCOUNTER — Encounter (HOSPITAL_BASED_OUTPATIENT_CLINIC_OR_DEPARTMENT_OTHER): Payer: Self-pay | Admitting: Pulmonary Disease

## 2023-03-08 VITALS — BP 116/64 | HR 56 | Temp 98.4°F | Ht 61.0 in | Wt 171.4 lb

## 2023-03-08 DIAGNOSIS — G4734 Idiopathic sleep related nonobstructive alveolar hypoventilation: Secondary | ICD-10-CM

## 2023-03-08 DIAGNOSIS — Z79899 Other long term (current) drug therapy: Secondary | ICD-10-CM | POA: Diagnosis not present

## 2023-03-08 DIAGNOSIS — D869 Sarcoidosis, unspecified: Secondary | ICD-10-CM

## 2023-03-08 LAB — CBC WITH DIFFERENTIAL
Basophils Absolute: 0.1 10*3/uL (ref 0.0–0.2)
Basos: 1 %
EOS (ABSOLUTE): 0.1 10*3/uL (ref 0.0–0.4)
Eos: 2 %
Hematocrit: 40 % (ref 34.0–46.6)
Hemoglobin: 13.2 g/dL (ref 11.1–15.9)
Immature Grans (Abs): 0 10*3/uL (ref 0.0–0.1)
Immature Granulocytes: 0 %
Lymphocytes Absolute: 1.2 10*3/uL (ref 0.7–3.1)
Lymphs: 17 %
MCH: 31 pg (ref 26.6–33.0)
MCHC: 33 g/dL (ref 31.5–35.7)
MCV: 94 fL (ref 79–97)
Monocytes Absolute: 0.5 10*3/uL (ref 0.1–0.9)
Monocytes: 7 %
Neutrophils Absolute: 4.9 10*3/uL (ref 1.4–7.0)
Neutrophils: 73 %
RBC: 4.26 x10E6/uL (ref 3.77–5.28)
RDW: 13.2 % (ref 11.7–15.4)
WBC: 6.9 10*3/uL (ref 3.4–10.8)

## 2023-03-08 LAB — COMPREHENSIVE METABOLIC PANEL
ALT: 32 IU/L (ref 0–32)
AST: 31 IU/L (ref 0–40)
Albumin: 4.3 g/dL (ref 3.8–4.8)
Alkaline Phosphatase: 89 IU/L (ref 44–121)
BUN/Creatinine Ratio: 25 (ref 12–28)
BUN: 19 mg/dL (ref 8–27)
Bilirubin Total: 0.7 mg/dL (ref 0.0–1.2)
CO2: 25 mmol/L (ref 20–29)
Calcium: 9.6 mg/dL (ref 8.7–10.3)
Chloride: 104 mmol/L (ref 96–106)
Creatinine, Ser: 0.77 mg/dL (ref 0.57–1.00)
Globulin, Total: 2.5 g/dL (ref 1.5–4.5)
Glucose: 94 mg/dL (ref 70–99)
Potassium: 4.4 mmol/L (ref 3.5–5.2)
Sodium: 142 mmol/L (ref 134–144)
Total Protein: 6.8 g/dL (ref 6.0–8.5)
eGFR: 82 mL/min/{1.73_m2} (ref >59)

## 2023-03-08 MED ORDER — METHOTREXATE SODIUM 2.5 MG PO TABS: 20.0000 mg | ORAL_TABLET | ORAL | 0 refills | Status: DC

## 2023-03-08 NOTE — Patient Instructions (Addendum)
Sarcoid History of immunosuppression --CT Chest 02/10/23 Improved LUL consolidation --Increase methotrexate (generic) to 8 tablets (20 mg) weekly --Continue folic acid 2 mg daily --ORDER CBC with diff, CMP today and q3 months  Confusion - unchanged of reduced methotrexate dosing May be medication related in setting of mild dementia --Discussed risks and benefits of methotrexate and after shared decision making will return to higher dose of methotrexate --However family will let me know if confusion worsens on increased dose

## 2023-03-08 NOTE — Progress Notes (Signed)
Subjective:   PATIENT ID: Lisa Acosta GENDER: female DOB: 11/03/1950, MRN: [id number]  Chief Complaint  Patient presents with   Follow-up    Follow up. Patient stated her cough has gotten slightly worse but it is not terrible.     Reason for Visit: Follow-up  [name] is a 72 year old female never smoker with pulmonary sarcoid GERD, HTN, HLD, atrial fibrillation who presents for follow-up.  Initial consult 10/05/22 She was previously seen by Dr. Tonia Brooms for pulmonary sarcoid and started on steroids, prednisone 30 mg daily. Since starting this she has had 20 lb weight gain and headaches, irritiability and darker stool per 09/18/22 telephone note. She was advised to hold. Her cough has returned and is improved but persistent. Cough had initially started six months ago.  She reports significant fatigue and prior sleep studies have been negative. She is wearing oxygen nightly.  01/05/23 Since our last visit she was started on methotrexate in Feb 11/04/22. Currently on 8 tablets (20 mg weekly). She is still feeling fatigued and tired. She is having confusion versus starting methotrexate. Unsure if this is related to the Alzheimer's. Her cough has improved but occasional. Denies shortness of breath or wheezing.  03/08/23 Since our last visit we had decreased methotrexate to 15 mg weekly. Cough has returned but not as bad however the associated mucous production has improved. Does not feel confused has improved with dosage.   Social History: Never smoker. 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   Pre-diabetes      Family History  Problem Relation Age of Onset   Atrial fibrillation Mother    Congestive Heart Failure Mother    Depression Mother    Heart disease Mother    Stroke Mother    Thyroid disease Mother    Anxiety disorder Mother    Obesity Mother    Heart disease Father    Alcohol abuse Father    High blood pressure Father    High Cholesterol Father    Alcoholism Father    Depression Sister    Heart disease Brother    Heart disease Brother    Depression Daughter      Social History   Occupational History   Occupation: Retired    Associate Professor: OTHER    Comment: Administrator, [employer]  Tobacco Use   Smoking status: Never    Passive exposure: Current (doesn't smoke in house now or past 87yrs)   Smokeless tobacco: Never  Vaping Use   Vaping Use: Never used  Substance and Sexual Activity   Alcohol use: Yes    Comment: rare   Drug use: No   Sexual activity: Not on file    Allergies  Allergen Reactions   Dextran Anaphylaxis   Tree Extract    Penicillin  G Rash   Penicillins Rash   Sulfa Antibiotics Rash and Other (See Comments)   Wound Dressing Adhesive Rash     Outpatient Medications Prior to Visit  Medication Sig Dispense Refill   alendronate (FOSAMAX) 70 MG tablet TAKE 1 TABLET BY MOUTH EVERY 7 DAYS, TAKE WITH A FULL GLASS OF WATER ON AN EMPTY STOMACH (Patient taking differently: Take 70 mg by mouth every Sunday.) 12 tablet 3   amLODipine (NORVASC) 5 MG tablet TAKE 1 TABLET (5 MG TOTAL) BY MOUTH DAILY. 90 tablet 0   atenolol (TENORMIN) 50 MG tablet TAKE 1 TABLET BY MOUTH EVERY DAY 90 tablet 0   benzonatate (TESSALON) 200 MG capsule Take 1 capsule (200 mg total) by mouth 3 (three) times daily as needed for cough. 30 capsule 1   Calcium Carb-Cholecalciferol (CALCIUM 600  + D PO) Take 1 tablet by mouth in the morning and at bedtime.     cyclobenzaprine (FLEXERIL) 10 MG tablet TAKE 1 TABLET BY MOUTH TWICE A DAY AS NEEDED FOR MUSCLE SPASMS 30 tablet 0   flecainide (TAMBOCOR) 100 MG tablet Take 1 tablet (100 mg total) by mouth 2 (two) times daily. 180 tablet 3   FLUoxetine (PROZAC) 40 MG capsule Take 1 capsule (40 mg total) by mouth 2 (two) times daily. 180 capsule 3   folic acid (FOLVITE) 1 MG tablet Take 2 tablets (2 mg total) by mouth daily. 180 tablet 3   HYDROcodone-acetaminophen (NORCO/VICODIN) 5-325 MG tablet Take 2 tablets by mouth every 4 (four) hours as needed. 30 tablet 0   Krill Oil 500 MG CAPS Take 500 mg by mouth daily.     levothyroxine (SYNTHROID) 125 MCG tablet TAKE 1 TABLET BY MOUTH DAILY BEFORE BREAKFAST. 90 tablet 3   magnesium oxide (MAG-OX) 400 (240 Mg) MG tablet Take 400 mg by mouth daily.     Multiple Vitamins-Minerals (PRESERVISION AREDS 2+MULTI VIT PO) Take 1 capsule by mouth daily.     omeprazole (PRILOSEC) 40 MG capsule Take 1 capsule (40 mg total) by mouth in the morning and at bedtime. 180 capsule 1   Propylene Glycol (SYSTANE BALANCE) 0.6 % SOLN Place 1 drop into both eyes as needed (dry eyes).     rivaroxaban (XARELTO) 20 MG TABS tablet Take 1 tablet (20 mg total) by mouth daily with supper. 14 tablet 0   rosuvastatin (CRESTOR) 20 MG tablet TAKE 1 TABLET BY MOUTH EVERY DAY 90 tablet 3   traZODone (DESYREL) 50 MG tablet TAKE 1.5 TABLETS BY MOUTH AT BEDTIME. 135 tablet 3   XARELTO 20 MG TABS tablet TAKE 1 TABLET BY MOUTH EVERY DAY 90 tablet 3   methotrexate (RHEUMATREX) 2.5 MG tablet Take 6 tablets (15 mg total) by mouth once a week. 72 tablet 0   No facility-administered medications prior to visit.    Review of Systems  Constitutional:  Negative for chills, diaphoresis, fever, malaise/fatigue and weight loss.  HENT:  Negative for congestion.   Respiratory:  Positive for cough and sputum production. Negative for hemoptysis, shortness  of breath and wheezing.   Cardiovascular:  Negative for chest pain, palpitations and leg swelling.     Objective:   Vitals:   03/08/23 0926  BP: 116/64  Pulse: (!) 56  Temp: 98.4 F (36.9 C)  TempSrc: Oral  SpO2: 96%  Weight: 171 lb 6.4 oz (77.7 kg)  Height: 5\' 1"  (1.549 m)   SpO2: 96 % O2 Device: None (Room air)  Physical Exam: General: Well-appearing, no acute distress  HENT: [place], AT Eyes: EOMI, no scleral icterus Respiratory: Clear to auscultation bilaterally.  No crackles, wheezing or rales Cardiovascular: RRR, -M/R/G, no JVD Extremities:-Edema,-tenderness Neuro: AAO x4, CNII-XII grossly intact Psych: Normal mood, normal affect  Data Reviewed:  Imaging: PET 06/07/22 - Hypermetabolic masslike consolidation in LUL, hypermetabolic left supraclavicular, hilar and mediastinal adenopathy. RUL nodule 12 mm new CT Super D 06/26/22 - LUL lung mass with GGO. RUL nodule resolved CT Chest 08/14/22 - Stable LUL focal opacity with surrounding GGO, new reticular opacities in lung bases, stable left supraclavicular, left hilar and prevascular lymph nodes CT Chest 02/10/23 - Slightly decreased LUL with surrounding GGO. Unchanged lower lobe reticulations. Stable mediastinal and hilar lymph nodes. Stable 6mm RML nodule  PFT: 08/29/16  FVC 2.63 (95%) FEV1 2.21 (105%) Ratio 84  TLC 91% DLCO 75% Interpretation: Normal spirometry and lung volumes. Mildly reduced DLCO.  10/05/22 FVC 2.37 (92%) FEV1 2.02 (104%) Ratio 85  TLC 87% DLCO 65% Interpretation: Normal spirometry and lung volumes. Compared to prior reduced DLCO, remains mild   Labs:    Latest Ref Rng & Units 01/05/2023   11:04 AM 10/05/2022    1:24 PM 06/15/2022    9:35 AM  CBC  WBC 3.4 - 10.8 x10E3/uL 7.5  8.1  6.7   Hemoglobin 11.1 - 15.9 g/dL 96.0  45.4  09.8   Hematocrit 34.0 - 46.6 % 41.4  39.3  40.1   Platelets 150 - 450 x10E3/uL  295  239       Latest Ref Rng & Units 01/05/2023   11:04 AM 10/05/2022    1:24 PM 06/15/2022     9:35 AM  CMP  Glucose 70 - 99 mg/dL 119  147  829   BUN 8 - 27 mg/dL 23  25  15    Creatinine 0.57 - 1.00 mg/dL 5.62  1.30  8.65   Sodium 134 - 144 mmol/L 141  139  137   Potassium 3.5 - 5.2 mmol/L 4.8  4.1  3.9   Chloride 96 - 106 mmol/L 104  100  107   CO2 20 - 29 mmol/L 22  22  23    Calcium 8.7 - 10.3 mg/dL 9.8  9.5  8.6   Total Protein 6.0 - 8.5 g/dL 6.8  6.6    Total Bilirubin 0.0 - 1.2 mg/dL 0.8  0.5    Alkaline Phos 44 - 121 IU/L 91  77    AST 0 - 40 IU/L 42  26    ALT 0 - 32 IU/L 35  19     Pathology: 06/29/22 LUL Needle, endobronchial biopsy- Numerous noncaseating granulomas  Sleep: PSG 03/30/22 - No sleep apnea. Nocturnal hypoxemia  ONO 08/11/23 SpO2 <88% 1 hour and 7 min. Average SpO2 89%. Nadir SpO2 72% Interpretation: Qualifies for oxygen     Assessment & Plan:   Discussion: 72 year old female never smoker with pulmonary sarcoid GERD, HTN, HLD, atrial fibrillation who presents for follow-up  We discussed the clinical course of sarcoid and management including serial PFTs, labs, eye exam, and EKG and chest imaging if indicated. If symptoms suggest sarcoid flare in the future, we would manage with steroids +/- biologics.  Reviewed CT scan with improved LUL consolidation consistent with sarcoid improvement on methotrexate. Unchanged/slightly improved reticulation in the lower portion of the lungs. Cough has returned on reduced methotrexate dose.  We discussed the clinical course of sarcoid and management including serial PFTs, labs, eye exam, and EKG and chest imaging if indicated.  If symptoms suggest sarcoid flare in the future, we would manage with steroids +/- biologics.  Reviewed risks and benefits of methotrexate therapy. Possible adverse effects including but not limited to increased risk of infection, liver toxicity, bone marrow suppression, pneumonitis and oral ulcers.  Confusion - unchanged of reduced methotrexate dosing May be medication related in  setting of mild dementia --Discussed risks and benefits of methotrexate and after shared decision making will return to higher dose of methotrexate --However family will let me know if confusion worsens on increased dose  Pulmonary sarcoidosis - active flare Chronic cough --Dx in 05/29/22 and 06/15/22 via endobronchial bx --PET/CT 06/07/22 Hypermetabolic masslike consolidation with mediastinal/hilar adenopathy --CT Chest 02/10/23 Improved LUL consolidation  History of immunosuppression High risk medication management --06/2022-09/2022 Prednisone 30 mg. Discontinued d/t agitation, HA. No taper --Off steroids --Increase methotrexate to 8 tablets (20 mg) weekly. Investigated cost. Generic preferred --Continue folic acid 2 mg daily --ORDER CBC with diff, CMP today and q3 months  Sarcoid Monitoring --Recent chest imaging reviewed. Plan for repeat imaging after on stable methotrexate dosing --Annual PFTs.  Last PFTs 09/2022. Mildly reduced DLCO --Annual ophthalmology exam. Last exam 12/26/22 --Followed by EP at [facility]. --Routine labs as needed: CBC with diff, CMET, 1, 25 and 25 hydroxy vitamin D, urinary calcium  Nocturnal hypoxemia Hx OSA --Continue oxygen 2L nightly --Please contact your DME company for oxygen access when traveling to other cities  Health Maintenance Immunization History  Administered Date(s) Administered   Fluad Quad(high Dose 65+) 05/02/2019, 06/23/2020, 06/30/2021, 05/29/2022   Influenza, High Dose Seasonal PF 06/14/2016, 05/15/2018   Influenza,inj,Quad PF,6+ Mos 06/11/2014   Influenza,inj,quad, With Preservative 06/11/2017   PFIZER(Purple Top)SARS-COV-2 Vaccination 10/03/2019, 10/24/2019, 06/17/2020, 03/16/2021   Pfizer Covid-19 Vaccine Bivalent Booster 29yrs & up 08/02/2021   Pneumococcal Conjugate-13 08/07/2016   Pneumococcal Polysaccharide-23 01/28/2018   Tdap 01/28/2018   CT Lung Screen - not qualified. Serial imaging as noted above  Orders Placed This  Encounter  Procedures   CBC With Differential   Comp Met (CMET)    Standing Status:   Future    Standing Expiration Date:   03/07/2024   Meds ordered this encounter  Medications   methotrexate (RHEUMATREX) 2.5 MG tablet    Sig: Take 8 tablets (20 mg total) by mouth once a week. Caution:Chemotherapy. Protect from light.    Dispense:  96 tablet    Refill:  0    Generic only    Return in about 3 months (around 06/08/2023).  I have spent a total time of 40-minutes on the day of the appointment including chart review, data review, collecting history, coordinating care and discussing medical diagnosis and plan with the patient/family. Past medical history, allergies, medications were reviewed. Pertinent imaging, labs and tests included in this note have been reviewed and interpreted independently by me.  Lisa Acosta Mechele Collin, MD [facility] 03/08/2023 1:08 PM  Office Number [phone]

## 2023-03-11 NOTE — Progress Notes (Unsigned)
[facility] [address], [address] [place], [place] [address] [phone]  Date:  03/12/2023   Name:  Lisa Acosta   DOB:  Jun 11, 1951   MRN:  [id number]  PCP:  Pearline Cables, MD    Chief Complaint: No chief complaint on file.   History of Present Illness:  Lisa Acosta is a 72 y.o. very pleasant female patient who presents with the following:  Patient seen today for virtual visit, concern of Patient location is home, location is office.  Patient identity confirmed with 2 factors, she gives consent for virtual visit today.  The patient and myself are present on the call today  Most recent visit with myself was in May of this year history of GERD, hypertension, hyperlipidemia, atrial fibrillation, anxiety and depression and recently diagnosed sarcoidosis   July 2023 she was having chest pain, she ended up having a PET/CT which showed a concerning chest mass.  Ultimately she was diagnosed with pulmonary sarcoid  Her pulmonologist is Dr. Maryclare Labrador was seen just last week at which point her methotrexate was increased due to sarcoid symptoms flare.  She is also using oxygen at night Confusion - unchanged of reduced methotrexate dosing May be medication related in setting of mild dementia --Discussed risks and benefits of methotrexate and after shared decision making will return to higher dose of methotrexate --However family will let me know if confusion worsens on increased dose Pulmonary sarcoidosis - active flare Chronic cough --Dx in 05/29/22 and 06/15/22 via endobronchial bx --PET/CT 06/07/22 Hypermetabolic masslike consolidation with mediastinal/hilar adenopathy --CT Chest 02/10/23 Improved LUL consolidation History of immunosuppression High risk medication management --06/2022-09/2022 Prednisone 30 mg. Discontinued d/t agitation, HA. No taper --Off steroids --Increase methotrexate to 8 tablets (20 mg) weekly.  Investigated cost. Generic preferred --Continue folic acid 2 mg daily --ORDER CBC with diff, CMP today and q3 months Sarcoid Monitoring --Recent chest imaging reviewed. Plan for repeat imaging after on stable methotrexate dosing --Annual PFTs.  Last PFTs 09/2022. Mildly reduced DLCO --Annual ophthalmology exam. Last exam 12/26/22 --Followed by EP at [facility]. --Routine labs as needed: CBC with diff, CMET, 1, 25 and 25 hydroxy vitamin D, urinary calcium Nocturnal hypoxemia Hx OSA --Continue oxygen 2L nightly --Please contact your DME company for oxygen access when traveling to other cities  She is seeing a psychologist through [facility] for depression-most recent follow-up was in June  Patient Active Problem List   Diagnosis Date Noted   Sarcoidosis 03/08/2023   High risk medication use 03/08/2023   Nocturnal hypoxemia 03/08/2023   Generalized obesity 10/12/2022   Class 2 severe obesity with serious comorbidity and body mass index (BMI) of 35.0 to 35.9 in adult (HCC) 08/15/2022   Lung infiltrate    Persistent pneumonia    Hilar adenopathy    Vitamin D insufficiency 01/10/2021   Memory difficulty 08/18/2019   Right wrist pain 05/04/2019   Mild episode of recurrent major depressive disorder (HCC) 12/11/2018   Depression, major, single episode, mild (HCC) 10/22/2018   Gait abnormality 10/14/2018   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   Moderate episode of recurrent major depressive disorder (HCC) 09/26/2018   Generalized anxiety disorder 09/26/2018   Osteoporosis 01/25/2018   Osteoarthritis of hands, 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   Dyspnea 09/22/2016   OSA (obstructive sleep apnea) 08/31/2016   PAF (paroxysmal atrial fibrillation) (HCC) 03/16/2016   Hypothyroid 03/16/2016   Obesity (BMI 30-39.9) 03/16/2016   Essential hypertension 11/10/2015  Hyperlipidemia 11/10/2015    Past Medical History:  Diagnosis Date   Arthritis    Atrial fibrillation (HCC)    Back pain     Complication of anesthesia    Constipation    Depression    Diverticulitis    Diverticulitis    Diverticulosis    Dysrhythmia    Esophageal ulcer    Fatty liver    Gait abnormality 10/14/2018   GERD (gastroesophageal reflux disease)    Hyperlipidemia    Hypertension    Hypothyroid    Memory difficulty 08/18/2019   OSA (obstructive sleep apnea) 08/31/2016   Osteopenia    Pneumonia    PONV (postoperative nausea and vomiting)    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   Pre-diabetes     Past Surgical History:  Procedure Laterality Date   BREAST BIOPSY Left    needle core biopsy, benign   BRONCHIAL BIOPSY  06/15/2022   Procedure: BRONCHIAL BIOPSIES;  Surgeon: Charlott Holler, MD;  Location: [facility] ENDOSCOPY;  Service: Pulmonary;;   BRONCHIAL BIOPSY  06/29/2022   Procedure: BRONCHIAL BIOPSIES;  Surgeon: Josephine Igo, DO;  Location: [facility] ENDOSCOPY;  Service: Pulmonary;;   BRONCHIAL NEEDLE ASPIRATION BIOPSY  06/15/2022   Procedure: BRONCHIAL NEEDLE ASPIRATION BIOPSIES;  Surgeon: Charlott Holler, MD;  Location: [facility] ENDOSCOPY;  Service: Pulmonary;;   BRONCHIAL NEEDLE ASPIRATION BIOPSY  06/29/2022   Procedure: BRONCHIAL NEEDLE ASPIRATION BIOPSIES;  Surgeon: Josephine Igo, DO;  Location: [facility] ENDOSCOPY;  Service: Pulmonary;;   BRONCHIAL WASHINGS  06/15/2022   Procedure: BRONCHIAL WASHINGS;  Surgeon: Charlott Holler, MD;  Location: [facility] ENDOSCOPY;  Service: Pulmonary;;   CATARACT EXTRACTION     DILATION AND CURETTAGE OF UTERUS     EYE SURGERY     HAND SURGERY     KNEE SURGERY     ROTATOR CUFF REPAIR     TOTAL ABDOMINAL HYSTERECTOMY     VIDEO BRONCHOSCOPY N/A 06/15/2022   Procedure: VIDEO BRONCHOSCOPY WITH FLUORO;  Surgeon: Charlott Holler, MD;  Location: [facility] ENDOSCOPY;  Service: Pulmonary;  Laterality: N/A;   VIDEO BRONCHOSCOPY WITH ENDOBRONCHIAL ULTRASOUND  06/15/2022   Procedure: VIDEO BRONCHOSCOPY WITH ENDOBRONCHIAL ULTRASOUND;  Surgeon: Charlott Holler, MD;  Location: [facility]  ENDOSCOPY;  Service: Pulmonary;;   VIDEO BRONCHOSCOPY WITH ENDOBRONCHIAL ULTRASOUND N/A 06/29/2022   Procedure: VIDEO BRONCHOSCOPY WITH ENDOBRONCHIAL ULTRASOUND;  Surgeon: Josephine Igo, DO;  Location: [facility] ENDOSCOPY;  Service: Pulmonary;  Laterality: N/A;    Social History   Tobacco Use   Smoking status: Never    Passive exposure: Current (doesn't smoke in house now or past 66yrs)   Smokeless tobacco: Never  Vaping Use   Vaping Use: Never used  Substance Use Topics   Alcohol use: Yes    Comment: rare   Drug use: No    Family History  Problem Relation Age of Onset   Atrial fibrillation Mother    Congestive Heart Failure Mother    Depression Mother    Heart disease Mother    Stroke Mother    Thyroid disease Mother    Anxiety disorder Mother    Obesity Mother    Heart disease Father    Alcohol abuse Father    High blood pressure Father    High Cholesterol Father    Alcoholism Father    Depression Sister    Heart disease Brother    Heart disease Brother    Depression Daughter     Allergies  Allergen Reactions   Dextran Anaphylaxis  Tree Extract    Penicillin G Rash   Penicillins Rash   Sulfa Antibiotics Rash and Other (See Comments)   Wound Dressing Adhesive Rash    Medication list has been reviewed and updated.  Current Outpatient Medications on File Prior to Visit  Medication Sig Dispense Refill   alendronate (FOSAMAX) 70 MG tablet TAKE 1 TABLET BY MOUTH EVERY 7 DAYS, TAKE WITH A FULL GLASS OF WATER ON AN EMPTY STOMACH (Patient taking differently: Take 70 mg by mouth every Sunday.) 12 tablet 3   amLODipine (NORVASC) 5 MG tablet TAKE 1 TABLET (5 MG TOTAL) BY MOUTH DAILY. 90 tablet 0   atenolol (TENORMIN) 50 MG tablet TAKE 1 TABLET BY MOUTH EVERY DAY 90 tablet 0   benzonatate (TESSALON) 200 MG capsule Take 1 capsule (200 mg total) by mouth 3 (three) times daily as needed for cough. 30 capsule 1   Calcium Carb-Cholecalciferol (CALCIUM 600 + D PO) Take 1 tablet  by mouth in the morning and at bedtime.     cyclobenzaprine (FLEXERIL) 10 MG tablet TAKE 1 TABLET BY MOUTH TWICE A DAY AS NEEDED FOR MUSCLE SPASMS 30 tablet 0   flecainide (TAMBOCOR) 100 MG tablet Take 1 tablet (100 mg total) by mouth 2 (two) times daily. 180 tablet 3   FLUoxetine (PROZAC) 40 MG capsule Take 1 capsule (40 mg total) by mouth 2 (two) times daily. 180 capsule 3   folic acid (FOLVITE) 1 MG tablet Take 2 tablets (2 mg total) by mouth daily. 180 tablet 3   HYDROcodone-acetaminophen (NORCO/VICODIN) 5-325 MG tablet Take 2 tablets by mouth every 4 (four) hours as needed. 30 tablet 0   Krill Oil 500 MG CAPS Take 500 mg by mouth daily.     levothyroxine (SYNTHROID) 125 MCG tablet TAKE 1 TABLET BY MOUTH DAILY BEFORE BREAKFAST. 90 tablet 3   magnesium oxide (MAG-OX) 400 (240 Mg) MG tablet Take 400 mg by mouth daily.     methotrexate (RHEUMATREX) 2.5 MG tablet Take 8 tablets (20 mg total) by mouth once a week. Caution:Chemotherapy. Protect from light. 96 tablet 0   Multiple Vitamins-Minerals (PRESERVISION AREDS 2+MULTI VIT PO) Take 1 capsule by mouth daily.     omeprazole (PRILOSEC) 40 MG capsule Take 1 capsule (40 mg total) by mouth in the morning and at bedtime. 180 capsule 1   Propylene Glycol (SYSTANE BALANCE) 0.6 % SOLN Place 1 drop into both eyes as needed (dry eyes).     rivaroxaban (XARELTO) 20 MG TABS tablet Take 1 tablet (20 mg total) by mouth daily with supper. 14 tablet 0   rosuvastatin (CRESTOR) 20 MG tablet TAKE 1 TABLET BY MOUTH EVERY DAY 90 tablet 3   traZODone (DESYREL) 50 MG tablet TAKE 1.5 TABLETS BY MOUTH AT BEDTIME. 135 tablet 3   XARELTO 20 MG TABS tablet TAKE 1 TABLET BY MOUTH EVERY DAY 90 tablet 3   No current facility-administered medications on file prior to visit.    Review of Systems:  As per HPI- otherwise negative.   Physical Examination: There were no vitals filed for this visit. There were no vitals filed for this visit. There is no height or weight on  file to calculate BMI. Ideal Body Weight:    GEN: no acute distress. HEENT: Atraumatic, Normocephalic.  Ears and Nose: No external deformity. CV: RRR, No M/G/R. No JVD. No thrill. No extra heart sounds. PULM: CTA B, no wheezes, crackles, rhonchi. No retractions. No resp. distress. No accessory muscle use. ABD: S, NT,  ND, +BS. No rebound. No HSM. EXTR: No c/c/e PSYCH: Normally interactive. Conversant.    Assessment and Plan: ***  Signed Abbe Amsterdam, MD

## 2023-03-12 ENCOUNTER — Other Ambulatory Visit: Payer: Medicare Other

## 2023-03-12 ENCOUNTER — Telehealth (INDEPENDENT_AMBULATORY_CARE_PROVIDER_SITE_OTHER): Payer: Medicare Other | Admitting: Family Medicine

## 2023-03-12 DIAGNOSIS — F320 Major depressive disorder, single episode, mild: Secondary | ICD-10-CM

## 2023-03-12 DIAGNOSIS — I480 Paroxysmal atrial fibrillation: Secondary | ICD-10-CM

## 2023-03-12 DIAGNOSIS — D869 Sarcoidosis, unspecified: Secondary | ICD-10-CM

## 2023-03-12 DIAGNOSIS — N3946 Mixed incontinence: Secondary | ICD-10-CM | POA: Diagnosis not present

## 2023-03-12 DIAGNOSIS — R7303 Prediabetes: Secondary | ICD-10-CM

## 2023-03-12 DIAGNOSIS — E7800 Pure hypercholesterolemia, unspecified: Secondary | ICD-10-CM | POA: Diagnosis not present

## 2023-03-12 DIAGNOSIS — I10 Essential (primary) hypertension: Secondary | ICD-10-CM | POA: Diagnosis not present

## 2023-03-12 DIAGNOSIS — F331 Major depressive disorder, recurrent, moderate: Secondary | ICD-10-CM | POA: Diagnosis not present

## 2023-03-12 MED ORDER — TOLTERODINE TARTRATE ER 4 MG PO CP24: 4.0000 mg | ORAL_CAPSULE | Freq: Every day | ORAL | 3 refills | Status: DC

## 2023-03-12 MED ORDER — ONDANSETRON HCL 8 MG PO TABS: 8.0000 mg | ORAL_TABLET | Freq: Three times a day (TID) | ORAL | 2 refills | Status: DC | PRN

## 2023-03-13 DIAGNOSIS — M1711 Unilateral primary osteoarthritis, right knee: Secondary | ICD-10-CM | POA: Diagnosis not present

## 2023-03-14 ENCOUNTER — Encounter: Payer: Self-pay | Admitting: Family Medicine

## 2023-03-15 ENCOUNTER — Encounter: Payer: Self-pay | Admitting: Family Medicine

## 2023-03-15 LAB — URINE CULTURE
MICRO NUMBER:: 15147238
SPECIMEN QUALITY:: ADEQUATE

## 2023-03-15 NOTE — Telephone Encounter (Signed)
Received urine culture- MDR pseudomonas UTI  I spoke with pharmacy who did not have a good oral option.  Spoke with infectious disease doctor on-call.  Given symptoms of incontinence (as opposed to acute UTI symptoms) and low colony growth they recommend not treating this particular organism unless she develops other symptoms.  Will follow-up with patient  Results for orders placed or performed in visit on 03/12/23  Urine Culture   Specimen: Urine  Result Value Ref Range   MICRO NUMBER: [id number]    SPECIMEN QUALITY: Adequate    Sample Source URINE    STATUS: FINAL    ISOLATE 1: Pseudomonas aeruginosa (A)       Susceptibility   Pseudomonas aeruginosa - URINE CULTURE, REFLEX    CEFTAZIDIME 16 Intermediate     CEFEPIME <=1 Intermediate     CIPROFLOXACIN >=4 Resistant     LEVOFLOXACIN >=8 Resistant     GENTAMICIN <=1 Sensitive     IMIPENEM 1 Sensitive     PIP/TAZO 8 Sensitive     TOBRAMYCIN* <=1 Sensitive      * Legend: S = Susceptible  I = Intermediate R = Resistant  NS = Not susceptible * = Not tested  NR = Not reported **NN = See antimicrobic comments

## 2023-03-22 ENCOUNTER — Other Ambulatory Visit: Payer: Self-pay | Admitting: Family Medicine

## 2023-03-22 DIAGNOSIS — E034 Atrophy of thyroid (acquired): Secondary | ICD-10-CM

## 2023-03-28 ENCOUNTER — Encounter: Payer: Self-pay | Admitting: Family Medicine

## 2023-03-28 ENCOUNTER — Encounter: Payer: Self-pay | Admitting: Nurse Practitioner

## 2023-03-28 ENCOUNTER — Ambulatory Visit: Payer: Medicare Other | Admitting: Nurse Practitioner

## 2023-03-28 VITALS — BP 110/72 | HR 51 | Temp 98.8°F | Ht 61.0 in | Wt 168.0 lb

## 2023-03-28 DIAGNOSIS — E669 Obesity, unspecified: Secondary | ICD-10-CM

## 2023-03-28 DIAGNOSIS — Z6831 Body mass index (BMI) 31.0-31.9, adult: Secondary | ICD-10-CM

## 2023-03-28 DIAGNOSIS — F320 Major depressive disorder, single episode, mild: Secondary | ICD-10-CM

## 2023-03-28 NOTE — Progress Notes (Signed)
Office: [phone]  /  Fax: [phone]  WEIGHT SUMMARY AND BIOMETRICS  Weight Lost Since Last Visit: 0lb  Weight Gained Since Last Visit: 2lb   Vitals Temp: 98.8 F (37.1 C) BP: 110/72 Pulse Rate: (!) 51 SpO2: 96 %   Anthropometric Measurements Height: 5\' 1"  (1.549 m) Weight: 168 lb (76.2 kg) BMI (Calculated): 31.76 Weight at Last Visit: 166lb Weight Lost Since Last Visit: 0lb Weight Gained Since Last Visit: 2lb Starting Weight: 168lb Total Weight Loss (lbs): 0 lb (0 kg)   Body Composition  Body Fat %: 46 % Fat Mass (lbs): 77.4 lbs Muscle Mass (lbs): 86.2 lbs Total Body Water (lbs): 62.6 lbs Visceral Fat Rating : 13   Other Clinical Data Fasting: No Labs: No Today's Visit #: 22 Starting Date: 01/05/21     HPI  Chief Complaint: OBESITY  Lisa Acosta is here to discuss her progress with her obesity treatment plan. She is on the practicing portion control and making smarter food choices, such as increasing vegetables and decreasing simple carbohydrates and states she is following her eating plan approximately 40 % of the time. She states she is exercising 30 minutes 2 days per week.   Interval History:  Since last office visit she has gained 2 pounds.  She recently saw her PCP for incontinence. She was prescribed Detrol LA but decided not to start it after reading the side effects.  She was diagnosed with pseudomonal aeruginosa and has been following up with her PCP. Her goal is to increase her exercise.  Plans to go to the gym 3 days per week and start exercising at home.  She is drinking water but enough due to her incontinence.   She is substituting a protein shake for breakfast.    Pharmacotherapy for weight loss: She is not currently taking medications  for medical weight loss.   Depression Saw behavorial health last on 03/12/23 and has a follow up appt scheduled in August. She is taking Prozac 40mg .  Wasn't able to start Trintellix  due to cost.  Plans to  discuss medications at her next visit in August. Continues to also follow up with her PCP.    PHYSICAL EXAM:  Blood pressure 110/72, pulse (!) 51, temperature 98.8 F (37.1 C), height 5\' 1"  (1.549 m), weight 168 lb (76.2 kg), SpO2 96%. Body mass index is 31.74 kg/m.  General: She is overweight, cooperative, alert, well developed, and in no acute distress. PSYCH: Has normal mood, affect and thought process.   Extremities: No edema.  Neurologic: No gross sensory or motor deficits. No tremors or fasciculations noted.    DIAGNOSTIC DATA REVIEWED:  BMET    Component Value Date/Time   NA 142 03/08/2023 1012   K 4.4 03/08/2023 1012   CL 104 03/08/2023 1012   CO2 25 03/08/2023 1012   GLUCOSE 94 03/08/2023 1012   GLUCOSE 120 (H) 06/15/2022 0935   BUN 19 03/08/2023 1012   CREATININE 0.77 03/08/2023 1012   CALCIUM 9.6 03/08/2023 1012   GFRNONAA >60 06/15/2022 0935   GFRAA >60 09/24/2019 1224   Lab Results  Component Value Date   HGBA1C 5.7 01/10/2023   HGBA1C 6.0 01/22/2017   Lab Results  Component Value Date   INSULIN 20.4 01/05/2021   Lab Results  Component Value Date   TSH 1.13 01/10/2023   CBC    Component Value Date/Time   WBC 6.9 03/08/2023 1012   WBC 6.7 06/15/2022 0935   RBC 4.26 03/08/2023 1012  RBC 4.43 06/15/2022 0935   HGB 13.2 03/08/2023 1012   HCT 40.0 03/08/2023 1012   PLT 295 10/05/2022 1324   MCV 94 03/08/2023 1012   MCH 31.0 03/08/2023 1012   MCH 29.6 06/15/2022 0935   MCHC 33.0 03/08/2023 1012   MCHC 32.7 06/15/2022 0935   RDW 13.2 03/08/2023 1012   Iron Studies No results found for: "IRON", "TIBC", "FERRITIN", "IRONPCTSAT" Lipid Panel     Component Value Date/Time   CHOL 151 01/10/2023 1430   CHOL 186 01/05/2021 1040   TRIG 74.0 01/10/2023 1430   HDL 66.70 01/10/2023 1430   HDL 67 01/05/2021 1040   CHOLHDL 2 01/10/2023 1430   VLDL 14.8 01/10/2023 1430   LDLCALC 70 01/10/2023 1430   LDLCALC 95 01/05/2021 1040   LDLCALC 103 (H)  06/23/2020 1148   LDLDIRECT 112.0 01/22/2017 1559   Hepatic Function Panel     Component Value Date/Time   PROT 6.8 03/08/2023 1012   ALBUMIN 4.3 03/08/2023 1012   AST 31 03/08/2023 1012   ALT 32 03/08/2023 1012   ALKPHOS 89 03/08/2023 1012   BILITOT 0.7 03/08/2023 1012   BILIDIR 0.1 06/23/2020 1148   BILIDIR 0.24 04/30/2020 1048   IBILI 0.7 06/23/2020 1148      Component Value Date/Time   TSH 1.13 01/10/2023 1430   Nutritional Lab Results  Component Value Date   VD25OH 79.10 11/16/2021   VD25OH 25.0 (L) 01/05/2021     ASSESSMENT AND PLAN  TREATMENT PLAN FOR OBESITY:  Recommended Dietary Goals  Lisa Acosta is currently in the action stage of change. As such, her goal is to continue weight management plan. She has agreed to following a lower carbohydrate, vegetable and lean protein rich diet plan.  Behavioral Intervention  We discussed the following Behavioral Modification Strategies today: increasing lean protein intake, decreasing simple carbohydrates , increasing vegetables, increasing lower glycemic fruits, increasing water intake, work on tracking and journaling calories using tracking application, reading food labels , keeping healthy foods at home, continue to practice mindfulness when eating, and planning for success.  Additional resources provided today: NA  Recommended Physical Activity Goals  Lisa Acosta has been advised to work up to 150 minutes of moderate intensity aerobic activity a week and strengthening exercises 2-3 times per week for cardiovascular health, weight loss maintenance and preservation of muscle mass.   She has agreed to Continue current level of physical activity , Think about ways to increase daily physical activity and overcoming barriers to exercise, and Increase physical activity in their day and reduce sedentary time (increase NEAT).   ASSOCIATED CONDITIONS ADDRESSED TODAY  Action/Plan  Depression, major, single episode, mild  (HCC) Continue to follow up with behavorial health and PCP.   To let someone know if she ever feels like life is not worth living.  To discuss medication with PCP.    Generalized obesity  BMI 31.0-31.9,adult         Return in about 4 weeks (around 04/25/2023).[name] She was informed of the importance of frequent follow up visits to maximize her success with intensive lifestyle modifications for her multiple health conditions.   ATTESTASTION STATEMENTS:  Reviewed by clinician on day of visit: allergies, medications, problem list, medical history, surgical history, family history, social history, and previous encounter notes.   Time spent on visit including pre-visit chart review and post-visit care and charting was 30 minutes.    Theodis Sato. Kandra Graven FNP-C

## 2023-04-02 ENCOUNTER — Other Ambulatory Visit: Payer: Self-pay | Admitting: Family Medicine

## 2023-04-02 DIAGNOSIS — I480 Paroxysmal atrial fibrillation: Secondary | ICD-10-CM

## 2023-04-03 ENCOUNTER — Other Ambulatory Visit: Payer: Self-pay | Admitting: Family Medicine

## 2023-04-03 DIAGNOSIS — R002 Palpitations: Secondary | ICD-10-CM

## 2023-04-07 ENCOUNTER — Encounter (HOSPITAL_BASED_OUTPATIENT_CLINIC_OR_DEPARTMENT_OTHER): Payer: Self-pay | Admitting: Emergency Medicine

## 2023-04-07 ENCOUNTER — Emergency Department (HOSPITAL_BASED_OUTPATIENT_CLINIC_OR_DEPARTMENT_OTHER)
Admission: EM | Admit: 2023-04-07 | Discharge: 2023-04-07 | Disposition: A | Discharge status: Home / Self Care | Payer: Medicare Other | Attending: Emergency Medicine | Admitting: Emergency Medicine

## 2023-04-07 ENCOUNTER — Other Ambulatory Visit: Payer: Self-pay

## 2023-04-07 DIAGNOSIS — U071 COVID-19: Secondary | ICD-10-CM

## 2023-04-07 DIAGNOSIS — R059 Cough, unspecified: Secondary | ICD-10-CM | POA: Diagnosis present

## 2023-04-07 MED ORDER — PROMETHAZINE HCL 25 MG PO TABS: 25.0000 mg | ORAL_TABLET | Freq: Four times a day (QID) | ORAL | 0 refills | Status: DC | PRN

## 2023-04-07 MED ORDER — BENZONATATE 200 MG PO CAPS: 200.0000 mg | ORAL_CAPSULE | Freq: Three times a day (TID) | ORAL | 1 refills | Status: DC | PRN

## 2023-04-07 NOTE — ED Notes (Signed)
Discharge paperwork reviewed entirely with patient, including follow up care. Pain was under control. The patient received instruction and coaching on their prescriptions, and all follow-up questions were answered.  Pt verbalized understanding as well as all parties involved. No questions or concerns voiced at the time of discharge. No acute distress noted.   Pt ambulated out to PVA without incident or assistance.  

## 2023-04-07 NOTE — ED Provider Notes (Signed)
[place] [facility] Provider Note   CSN: [id number] Arrival date & time: 04/07/23  1105     History  Chief Complaint  Patient presents with   Sore Throat    Lisa Acosta is a 72 y.o. female.  72 yo  F with a chief complaints of cough congestion fever going on for about 3 days now.  She did a home test that was positive for COVID.  Her husband was also sick but was doing much better than she was.  She think she is more sick due to her history of sarcoidosis and she is chronically on methotrexate.  She called her nursing hotline today and they suggested she come to the ER to be evaluated for possible hypoxia as she has had some difficulty getting up and walking around.  She is on oxygen at night due to what sounds like sleep apnea.   Sore Throat       Home Medications Prior to Admission medications   Medication Sig Start Date End Date Taking? Authorizing Provider  promethazine (PHENERGAN) 25 MG tablet Take 1 tablet (25 mg total) by mouth every 6 (six) hours as needed for nausea or vomiting. 04/07/23  Yes Melene Plan, DO  alendronate (FOSAMAX) 70 MG tablet TAKE 1 TABLET BY MOUTH EVERY 7 DAYS, TAKE WITH A FULL GLASS OF WATER ON AN EMPTY STOMACH Patient taking differently: Take 70 mg by mouth every Sunday. 05/17/22   Copland, Gwenlyn Found, MD  amLODipine (NORVASC) 5 MG tablet TAKE 1 TABLET (5 MG TOTAL) BY MOUTH DAILY. 04/02/23   Copland, Gwenlyn Found, MD  atenolol (TENORMIN) 50 MG tablet TAKE 1 TABLET BY MOUTH EVERY DAY 04/03/23   Copland, Gwenlyn Found, MD  benzonatate (TESSALON) 200 MG capsule Take 1 capsule (200 mg total) by mouth 3 (three) times daily as needed for cough. 04/07/23   Melene Plan, DO  Calcium Carb-Cholecalciferol (CALCIUM 600 + D PO) Take 1 tablet by mouth in the morning and at bedtime.    [provider]  cyclobenzaprine (FLEXERIL) 10 MG tablet TAKE 1 TABLET BY MOUTH TWICE A DAY AS NEEDED FOR MUSCLE SPASMS 12/07/22   Zola Button,  Grayling Congress, DO  flecainide (TAMBOCOR) 100 MG tablet Take 1 tablet (100 mg total) by mouth 2 (two) times daily. 08/17/22   Copland, Gwenlyn Found, MD  FLUoxetine (PROZAC) 40 MG capsule Take 1 capsule (40 mg total) by mouth 2 (two) times daily. 01/10/23   Copland, Gwenlyn Found, MD  folic acid (FOLVITE) 1 MG tablet Take 2 tablets (2 mg total) by mouth daily. 10/19/22   Murrell Redden, RPH-CPP  HYDROcodone-acetaminophen (NORCO/VICODIN) 5-325 MG tablet Take 2 tablets by mouth every 4 (four) hours as needed. Patient not taking: Reported on 03/28/2023 11/24/22   Copland, Gwenlyn Found, MD  Krill Oil 500 MG CAPS Take 500 mg by mouth daily.    [provider]  levothyroxine (SYNTHROID) 125 MCG tablet Take 1 tablet (125 mcg total) by mouth daily before breakfast. 03/22/23   Copland, Gwenlyn Found, MD  magnesium oxide (MAG-OX) 400 (240 Mg) MG tablet Take 400 mg by mouth daily.    [provider]  methotrexate (RHEUMATREX) 2.5 MG tablet Take 8 tablets (20 mg total) by mouth once a week. Caution:Chemotherapy. Protect from light. 03/08/23 06/06/23  Luciano Cutter, MD  Multiple Vitamins-Minerals (PRESERVISION AREDS 2+MULTI VIT PO) Take 1 capsule by mouth daily.    [provider]  omeprazole (PRILOSEC) 40 MG capsule  Take 1 capsule (40 mg total) by mouth in the morning and at bedtime. 01/22/23   Copland, Gwenlyn Found, MD  ondansetron (ZOFRAN) 8 MG tablet Take 1 tablet (8 mg total) by mouth every 8 (eight) hours as needed for nausea or vomiting. 03/12/23   Copland, Gwenlyn Found, MD  Propylene Glycol (SYSTANE BALANCE) 0.6 % SOLN Place 1 drop into both eyes as needed (dry eyes).    [provider]  rivaroxaban (XARELTO) 20 MG TABS tablet Take 1 tablet (20 mg total) by mouth daily with supper. 12/13/22   Lewayne Bunting, MD  rosuvastatin (CRESTOR) 20 MG tablet TAKE 1 TABLET BY MOUTH EVERY DAY 12/20/22   Copland, Gwenlyn Found, MD  tolterodine (DETROL LA) 4 MG 24 hr capsule Take 1 capsule (4 mg total) by mouth daily.  03/12/23   Copland, Gwenlyn Found, MD  traZODone (DESYREL) 50 MG tablet TAKE 1.5 TABLETS BY MOUTH AT BEDTIME. 10/30/22   Copland, Gwenlyn Found, MD  XARELTO 20 MG TABS tablet TAKE 1 TABLET BY MOUTH EVERY DAY 07/24/22   Copland, Gwenlyn Found, MD      Allergies    Dextran, Tree extract, Penicillin g, Penicillins, Sulfa antibiotics, and Wound dressing adhesive    Review of Systems   Review of Systems  Physical Exam Updated Vital Signs BP (!) 121/58   Pulse (!) 51   Temp 98.8 F (37.1 C) (Oral)   Resp 16   SpO2 95%  Physical Exam Vitals and nursing note reviewed.  Constitutional:      General: She is not in acute distress.    Appearance: She is well-developed. She is not diaphoretic.  HENT:     Head: Normocephalic and atraumatic.  Eyes:     Pupils: Pupils are equal, round, and reactive to light.  Cardiovascular:     Rate and Rhythm: Normal rate and regular rhythm.     Heart sounds: No murmur heard.    No friction rub. No gallop.  Pulmonary:     Effort: Pulmonary effort is normal.     Breath sounds: No wheezing or rales.     Comments: Coarse breath sounds in all fields Abdominal:     General: There is no distension.     Palpations: Abdomen is soft.     Tenderness: There is no abdominal tenderness.  Musculoskeletal:        General: No tenderness.     Cervical back: Normal range of motion and neck supple.  Skin:    General: Skin is warm and dry.  Neurological:     Mental Status: She is alert and oriented to person, place, and time.  Psychiatric:        Behavior: Behavior normal.     ED Results / Procedures / Treatments   Labs (all labs ordered are listed, but only abnormal results are displayed) Labs Reviewed - No data to display  EKG None  Radiology No results found.  Procedures Procedures    Medications Ordered in ED Medications - No data to display  ED Course/ Medical Decision Making/ A&P                             Medical Decision Making Risk Prescription  drug management.   72 yo F with a chief complaint of cough congestion fever chills myalgias sore throat going on for about 3 days now.  Patient has a history of sarcoidosis and is chronically on methotrexate.  Had  a positive home COVID test today.  She called her nursing hotline who encouraged her to come in to be evaluated for hypoxia.  Not hypoxic at rest.  Will have her ambulate in the room.  She otherwise well-appearing nontoxic and would prefer not to come into the hospital.  I discussed risk and benefits of Paxlovid therapy and she is currently declining.  No hypoxia here with ambulation.  D/c home.  PCP follow up.   11:43 AM:  I have discussed the diagnosis/risks/treatment options with the patient and family.  Evaluation and diagnostic testing in the emergency department does not suggest an emergent condition requiring admission or immediate intervention beyond what has been performed at this time.  They will follow up with PCP. We also discussed returning to the ED immediately if new or worsening sx occur. We discussed the sx which are most concerning (e.g., sudden worsening pain, fever, inability to tolerate by mouth) that necessitate immediate return. Medications administered to the patient during their visit and any new prescriptions provided to the patient are listed below.  Medications given during this visit Medications - No data to display   The patient appears reasonably screen and/or stabilized for discharge and I doubt any other medical condition or other [facility] requiring further screening, evaluation, or treatment in the ED at this time prior to discharge.          Final Clinical Impression(s) / ED Diagnoses Final diagnoses:  COVID-19 virus infection    Rx / DC Orders ED Discharge Orders          Ordered    promethazine (PHENERGAN) 25 MG tablet  Every 6 hours PRN        04/07/23 1135    benzonatate (TESSALON) 200 MG capsule  3 times daily PRN        04/07/23 1142               Melene Plan, DO 04/07/23 1143

## 2023-04-07 NOTE — ED Notes (Signed)
Walking in place x . Start 97% spo2  HR 59 Stop 96% spo2 HR 67  Patient endorsees mild increases WOB.

## 2023-04-07 NOTE — Discharge Instructions (Signed)
Take tylenol 2 pills 4 times a day and motrin 4 pills 3 times a day.  Drink plenty of fluids.  Return for worsening shortness of breath, headache, confusion. Follow up with your family doctor.   

## 2023-04-07 NOTE — ED Triage Notes (Addendum)
Pt reports she has sore throat, productive cough, nausea, and runny nose since Thursday evening. Tested positive today for Covid. Also reports she was diagnosed with a abx resistant UTI 2 weeks ago, and ate meat that may have been contaminated. On methotrexate

## 2023-04-09 ENCOUNTER — Telehealth: Payer: Self-pay

## 2023-04-09 NOTE — Telephone Encounter (Signed)
Pt seen at ED 

## 2023-04-09 NOTE — Telephone Encounter (Signed)
Initial Comment Caller states she tested positive for covid. SHe has a compromised immune system and she has another infection. Translation No Nurse Assessment Nurse: Tresa Endo, RN, Lurena Joiner Date/Time Lisa Acosta Time): 04/07/2023 10:17:02 AM Confirm and document reason for call. If symptomatic, describe symptoms. ---Caller states she has felt Covid symptoms since Thursday night and tested positive today. Taking Methotrexate for lung mass. Does the patient have any new or worsening symptoms? ---Yes Will a triage be completed? ---Yes Related visit to physician within the last 2 weeks? ---No Does the PT have any chronic conditions? (i.e. diabetes, asthma, this includes High risk factors for pregnancy, etc.) ---Yes List chronic conditions. ---Lung Mass, Afib, HTN Is this a behavioral health or substance abuse call? ---No Guidelines Guideline Title Affirmed Question Affirmed Notes Nurse Date/Time (Eastern Time) COVID-19 - Diagnosed or Suspected MILD difficulty breathing (e.g., minimal/no SOB at rest, SOB with walking, pulse <100) Tresa Endo, RN, Lurena Joiner 04/07/2023 10:20:11 AM PLEASE NOTE: All timestamps contained within this report are represented as [place] Standard Time. CONFIDENTIALTY NOTICE: This fax transmission is intended only for the addressee. It contains information that is legally privileged, confidential or otherwise protected from use or disclosure. If you are not the intended recipient, you are strictly prohibited from reviewing, disclosing, copying using or disseminating any of this information or taking any action in reliance on or regarding this information. If you have received this fax in error, please notify us immediately by telephone so that we can arrange for its return to [place]. Phone: [phone], Toll-Free: [phone], Fax: [phone] Page: 2 of 2 Call Id: [id number] Disp. Time Lisa Acosta Time) Disposition Final User 04/07/2023 10:28:59 AM See HCP within 4 Hours (or  PCP triage) Yes Tresa Endo, RN, Lurena Joiner Final Disposition 04/07/2023 10:28:59 AM See HCP within 4 Hours (or PCP triage) Yes Tresa Endo, RN, Cecil Cobbs Disagree/Comply Comply Caller Understands Yes PreDisposition Go to Urgent Care/Walk-In Clinic Care Advice Given Per Guideline SEE HCP (OR PCP TRIAGE) WITHIN 4 HOURS: * IF OFFICE WILL BE CLOSED AND NO PCP (PRIMARY CARE PROVIDER) SECOND-LEVEL TRIAGE: You need to be seen within the next 3 or 4 hours. A nearby Urgent Care Center [facility]) is often a good source of care. Another choice is to go to the ED. Go sooner if you become worse. GENERAL CARE ADVICE FOR COVID-19 SYMPTOMS: * The symptoms are generally treated the same whether you have COVID-19, influenza or some other respiratory virus. * Cough: Use cough drops. * Feeling dehydrated: Drink extra liquids. If the air in your home is dry, use a humidifier. * Sore throat: Try throat lozenges, hard candy or warm chicken broth. CALL BACK IF: * You become worse CARE ADVICE given per COVID-19 - DIAGNOSED OR SUSPECTED (Adult) guideline. Referrals GO TO FACILITY UNDECIDED

## 2023-04-10 ENCOUNTER — Encounter: Payer: Self-pay | Admitting: Family Medicine

## 2023-04-19 ENCOUNTER — Encounter: Payer: Self-pay | Admitting: Family Medicine

## 2023-04-19 ENCOUNTER — Ambulatory Visit (INDEPENDENT_AMBULATORY_CARE_PROVIDER_SITE_OTHER): Payer: Medicare Other | Admitting: Family Medicine

## 2023-04-19 VITALS — BP 107/61 | HR 58 | Temp 98.1°F | Ht 61.0 in | Wt 173.0 lb

## 2023-04-19 DIAGNOSIS — J988 Other specified respiratory disorders: Secondary | ICD-10-CM

## 2023-04-19 MED ORDER — CLINDAMYCIN HCL 300 MG PO CAPS
300.0000 mg | ORAL_CAPSULE | Freq: Three times a day (TID) | ORAL | 0 refills | Status: AC
Start: 2023-04-19 — End: 2023-04-24

## 2023-04-19 MED ORDER — GUAIFENESIN ER 600 MG PO TB12
1200.0000 mg | ORAL_TABLET | Freq: Two times a day (BID) | ORAL | 0 refills | Status: AC
Start: 2023-04-19 — End: ?

## 2023-04-19 MED ORDER — ONDANSETRON HCL 8 MG PO TABS
8.0000 mg | ORAL_TABLET | Freq: Three times a day (TID) | ORAL | 2 refills | Status: AC | PRN
Start: 2023-04-19 — End: ?
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
## 2023-04-19 NOTE — Patient Instructions (Signed)
Adding Clindamycin and Mucinex Please also take a probiotic (at least 2 hours apart from an antibiotic dose) Continue supportive measures including rest, hydration, humidifier use, steam showers, warm compresses to sinuses, warm liquids with lemon and honey, and over-the-counter cough, cold, and analgesics as needed.   Please contact office for follow-up if symptoms do not improve or worsen. Seek emergency care if symptoms become severe. Consider chest xray if not improving appropriately.

## 2023-04-30 DIAGNOSIS — F331 Major depressive disorder, recurrent, moderate: Secondary | ICD-10-CM | POA: Diagnosis not present

## 2023-05-03 ENCOUNTER — Ambulatory Visit: Payer: Medicare Other | Admitting: Nurse Practitioner

## 2023-05-07 NOTE — Progress Notes (Unsigned)
[facility] [address], [address] [place], [place] [address] [phone]  Date:  05/09/2023   Name:  Lisa Acosta   DOB:  [birth year]   MRN:  [id number]  PCP:  Pearline Cables, MD    Chief Complaint: No chief complaint on file.   History of Present Illness:  Lisa Acosta is a 72 y.o. very pleasant female patient who presents with the following:  Patient seen today for medication follow-up Most recent visit with myself was a virtual visit in July- history of GERD, hypertension, hyperlipidemia, atrial fibrillation, anxiety and depression and recently diagnosed pulmonary sarcoidosis   At our last visit we discussed urinary incontinence present for several months-I had her try some Detrol LA but a urine culture at that time was actually positive for low colony count Pseudomonas aeruginosa This bacterium was resistant to really all oral antibiotic options-I discussed with infectious disease who felt given low colony count we were okay to not treat  She then had COVID in late July of this year  She had a virtual visit with her behavioral health specialist about 10 days ago  Her most recent visit with pulmonology, Dr. Everardo All was in June: Sarcoid History of immunosuppression --CT Chest 02/10/23 Improved LUL consolidation --Increase methotrexate (generic) to 8 tablets (20 mg) weekly --Continue folic acid 2 mg daily --ORDER CBC with diff, CMP today and q3 months Confusion - unchanged of reduced methotrexate dosing May be medication related in setting of mild dementia --Discussed risks and benefits of methotrexate and after shared decision making will return to higher dose of methotrexate --However family will let me know if confusion worsens on increased dose  Cardiology note from [facility] for 1 year follow-up 1.  Paroxysmal atrial fibrillation-denies recent episodes of irregular or accelerated heartbeat.   Reports compliance with Xarelto and denies bleeding issues.  Notes that she is in the donut hole.  Will provide Xarelto samples. Continue Xarelto, atenolol, flecainide Heart healthy low-sodium diet-salty 6 given Increase physical activity as tolerated Avoid triggers caffeine, chocolate, EtOH, dehydration etc.   Essential hypertension-BP today 118/64 Continue atenolol, amlodipine Heart healthy low-sodium diet-salty 6 given Increase physical activity as tolerated Request labs from PCP   Hyperlipidemia-compliant with statin therapy.  Denies side effects. Continue Crestor Follows with PCP  Patient Active Problem List   Diagnosis Date Noted   Sarcoidosis 03/08/2023   High risk medication use 03/08/2023   Nocturnal hypoxemia 03/08/2023   Generalized obesity 10/12/2022   Class 2 severe obesity with serious comorbidity and body mass index (BMI) of 35.0 to 35.9 in adult (HCC) 08/15/2022   Lung infiltrate    Persistent pneumonia    Hilar adenopathy    Vitamin D insufficiency 01/10/2021   Memory difficulty 08/18/2019   Right wrist pain 05/04/2019   Mild episode of recurrent major depressive disorder (HCC) 12/11/2018   Depression, major, single episode, mild (HCC) 10/22/2018   Gait abnormality 10/14/2018   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   Moderate episode of recurrent major depressive disorder (HCC) 09/26/2018   Generalized anxiety disorder 09/26/2018   Osteoporosis 01/25/2018   Osteoarthritis of hands, 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   Dyspnea 09/22/2016   OSA (obstructive sleep apnea) 08/31/2016   PAF (paroxysmal atrial fibrillation) (HCC) 03/16/2016   Hypothyroid 03/16/2016   Obesity (BMI 30-39.9) 03/16/2016   Essential hypertension 11/10/2015   Hyperlipidemia 11/10/2015    Past Medical History:  Diagnosis Date   Arthritis    Atrial  fibrillation (HCC)    Back pain    Complication of anesthesia    Constipation    Depression    Diverticulitis    Diverticulitis     Diverticulosis    Dysrhythmia    Esophageal ulcer    Fatty liver    Gait abnormality 10/14/2018   GERD (gastroesophageal reflux disease)    Hyperlipidemia    Hypertension    Hypothyroid    Memory difficulty 08/18/2019   OSA (obstructive sleep apnea) 08/31/2016   Osteopenia    Pneumonia    PONV (postoperative nausea and vomiting)    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   Pre-diabetes     Past Surgical History:  Procedure Laterality Date   BREAST BIOPSY Left    needle core biopsy, benign   BRONCHIAL BIOPSY  06/15/2022   Procedure: BRONCHIAL BIOPSIES;  Surgeon: Charlott Holler, MD;  Location: [facility] ENDOSCOPY;  Service: Pulmonary;;   BRONCHIAL BIOPSY  06/29/2022   Procedure: BRONCHIAL BIOPSIES;  Surgeon: Josephine Igo, DO;  Location: [facility] ENDOSCOPY;  Service: Pulmonary;;   BRONCHIAL NEEDLE ASPIRATION BIOPSY  06/15/2022   Procedure: BRONCHIAL NEEDLE ASPIRATION BIOPSIES;  Surgeon: Charlott Holler, MD;  Location: [facility] ENDOSCOPY;  Service: Pulmonary;;   BRONCHIAL NEEDLE ASPIRATION BIOPSY  06/29/2022   Procedure: BRONCHIAL NEEDLE ASPIRATION BIOPSIES;  Surgeon: Josephine Igo, DO;  Location: [facility] ENDOSCOPY;  Service: Pulmonary;;   BRONCHIAL WASHINGS  06/15/2022   Procedure: BRONCHIAL WASHINGS;  Surgeon: Charlott Holler, MD;  Location: [facility] ENDOSCOPY;  Service: Pulmonary;;   CATARACT EXTRACTION     DILATION AND CURETTAGE OF UTERUS     EYE SURGERY     HAND SURGERY     KNEE SURGERY     ROTATOR CUFF REPAIR     TOTAL ABDOMINAL HYSTERECTOMY     VIDEO BRONCHOSCOPY N/A 06/15/2022   Procedure: VIDEO BRONCHOSCOPY WITH FLUORO;  Surgeon: Charlott Holler, MD;  Location: [facility] ENDOSCOPY;  Service: Pulmonary;  Laterality: N/A;   VIDEO BRONCHOSCOPY WITH ENDOBRONCHIAL ULTRASOUND  06/15/2022   Procedure: VIDEO BRONCHOSCOPY WITH ENDOBRONCHIAL ULTRASOUND;  Surgeon: Charlott Holler, MD;  Location: [facility] ENDOSCOPY;  Service: Pulmonary;;   VIDEO BRONCHOSCOPY WITH ENDOBRONCHIAL ULTRASOUND N/A 06/29/2022    Procedure: VIDEO BRONCHOSCOPY WITH ENDOBRONCHIAL ULTRASOUND;  Surgeon: Josephine Igo, DO;  Location: [facility] ENDOSCOPY;  Service: Pulmonary;  Laterality: N/A;    Social History   Tobacco Use   Smoking status: Never    Passive exposure: Current (doesn't smoke in house now or past 71yrs)   Smokeless tobacco: Never  Vaping Use   Vaping status: Never Used  Substance Use Topics   Alcohol use: Yes    Comment: rare   Drug use: No    Family History  Problem Relation Age of Onset   Atrial fibrillation Mother    Congestive Heart Failure Mother    Depression Mother    Heart disease Mother    Stroke Mother    Thyroid disease Mother    Anxiety disorder Mother    Obesity Mother    Heart disease Father    Alcohol abuse Father    High blood pressure Father    High Cholesterol Father    Alcoholism Father    Depression Sister    Heart disease Brother    Heart disease Brother    Depression Daughter     Allergies  Allergen Reactions   Dextran Anaphylaxis   Tree Extract    Penicillin G Rash   Penicillins Rash   Sulfa Antibiotics  Rash and Other (See Comments)   Wound Dressing Adhesive Rash    Medication list has been reviewed and updated.  Current Outpatient Medications on File Prior to Visit  Medication Sig Dispense Refill   amLODipine (NORVASC) 5 MG tablet TAKE 1 TABLET (5 MG TOTAL) BY MOUTH DAILY. 90 tablet 0   atenolol (TENORMIN) 50 MG tablet TAKE 1 TABLET BY MOUTH EVERY DAY 90 tablet 1   benzonatate (TESSALON) 200 MG capsule Take 1 capsule (200 mg total) by mouth 3 (three) times daily as needed for cough. 30 capsule 1   Calcium Carb-Cholecalciferol (CALCIUM 600 + D PO) Take 1 tablet by mouth in the morning and at bedtime.     flecainide (TAMBOCOR) 100 MG tablet Take 1 tablet (100 mg total) by mouth 2 (two) times daily. 180 tablet 3   FLUoxetine (PROZAC) 40 MG capsule Take 1 capsule (40 mg total) by mouth 2 (two) times daily. 180 capsule 3   folic acid (FOLVITE) 1 MG tablet Take  2 tablets (2 mg total) by mouth daily. 180 tablet 3   guaiFENesin (MUCINEX) 600 MG 12 hr tablet Take 2 tablets (1,200 mg total) by mouth 2 (two) times daily. 30 tablet 0   HYDROcodone-acetaminophen (NORCO/VICODIN) 5-325 MG tablet Take 2 tablets by mouth every 4 (four) hours as needed. 30 tablet 0   Krill Oil 500 MG CAPS Take 500 mg by mouth daily.     levothyroxine (SYNTHROID) 125 MCG tablet Take 1 tablet (125 mcg total) by mouth daily before breakfast. 90 tablet 0   magnesium oxide (MAG-OX) 400 (240 Mg) MG tablet Take 400 mg by mouth daily.     methotrexate (RHEUMATREX) 2.5 MG tablet Take 8 tablets (20 mg total) by mouth once a week. Caution:Chemotherapy. Protect from light. 96 tablet 0   Multiple Vitamins-Minerals (PRESERVISION AREDS 2+MULTI VIT PO) Take 1 capsule by mouth daily.     omeprazole (PRILOSEC) 40 MG capsule Take 1 capsule (40 mg total) by mouth in the morning and at bedtime. 180 capsule 1   ondansetron (ZOFRAN) 8 MG tablet Take 1 tablet (8 mg total) by mouth every 8 (eight) hours as needed for nausea or vomiting. 30 tablet 2   Propylene Glycol (SYSTANE BALANCE) 0.6 % SOLN Place 1 drop into both eyes as needed (dry eyes).     rivaroxaban (XARELTO) 20 MG TABS tablet Take 1 tablet (20 mg total) by mouth daily with supper. 14 tablet 0   rosuvastatin (CRESTOR) 20 MG tablet TAKE 1 TABLET BY MOUTH EVERY DAY 90 tablet 3   traZODone (DESYREL) 50 MG tablet TAKE 1.5 TABLETS BY MOUTH AT BEDTIME. 135 tablet 3   No current facility-administered medications on file prior to visit.    Review of Systems:  As per HPI- otherwise negative.   Physical Examination: There were no vitals filed for this visit. There were no vitals filed for this visit. There is no height or weight on file to calculate BMI. Ideal Body Weight:    GEN: no acute distress. HEENT: Atraumatic, Normocephalic.  Ears and Nose: No external deformity. CV: RRR, No M/G/R. No JVD. No thrill. No extra heart sounds. PULM: CTA  B, no wheezes, crackles, rhonchi. No retractions. No resp. distress. No accessory muscle use. ABD: S, NT, ND, +BS. No rebound. No HSM. EXTR: No c/c/e PSYCH: Normally interactive. Conversant.    Assessment and Plan: ***  Signed Abbe Amsterdam, MD

## 2023-05-07 NOTE — Patient Instructions (Incomplete)
It was good to see you again today  Recommend flu shot, COVID booster this fall  To taper off fluoxetine/ prozac-  Take 40 mg daily for 5 days Then take 40 mg every other day for 3-4 doses.  Then stop the fluoxetine and start on venlafaxine/ effexor.  Let me know what you think!    Use the nystatin powder as needed for rash in skin folds

## 2023-05-09 ENCOUNTER — Ambulatory Visit (INDEPENDENT_AMBULATORY_CARE_PROVIDER_SITE_OTHER): Payer: Medicare Other | Admitting: Family Medicine

## 2023-05-09 VITALS — BP 122/80 | HR 58 | Temp 97.7°F | Resp 18 | Ht 61.0 in | Wt 170.6 lb

## 2023-05-09 DIAGNOSIS — F320 Major depressive disorder, single episode, mild: Secondary | ICD-10-CM | POA: Diagnosis not present

## 2023-05-09 DIAGNOSIS — B372 Candidiasis of skin and nail: Secondary | ICD-10-CM

## 2023-05-09 MED ORDER — NYSTATIN 100000 UNIT/GM EX POWD: 1.0000 | Freq: Three times a day (TID) | CUTANEOUS | 2 refills | Status: DC

## 2023-05-09 MED ORDER — VENLAFAXINE HCL ER 75 MG PO CP24: 75.0000 mg | ORAL_CAPSULE | Freq: Every day | ORAL | 3 refills | Status: DC

## 2023-05-09 NOTE — Progress Notes (Signed)
HPI: FU atrial fibrillation. Monitor March 2017 showed sinus rhythm with paroxysmal atrial fibrillation.  Patient treated with flecainide.  Exercise treadmill October 2018 showed no exercise-induced ventricular tachycardia; no ST changes noted. Abdominal ultrasound November 2020 showed no aneurysm. Echocardiogram November 2023 showed normal LV function, grade 1 diastolic dysfunction.  Stress MRI at [facility] May 2023 showed normal LV function, mild left atrial enlargement, no infiltrative process in the myocardium and no ischemia.  Since last seen, she is recently recovering from COVID.  She denies increased dyspnea, chest pain, palpitations or syncope.  Some fatigue.  Current Outpatient Medications  Medication Sig Dispense Refill   amLODipine (NORVASC) 5 MG tablet TAKE 1 TABLET (5 MG TOTAL) BY MOUTH DAILY. 90 tablet 0   atenolol (TENORMIN) 50 MG tablet TAKE 1 TABLET BY MOUTH EVERY DAY 90 tablet 1   Calcium Carb-Cholecalciferol (CALCIUM 600 + D PO) Take 1 tablet by mouth in the morning and at bedtime.     flecainide (TAMBOCOR) 100 MG tablet Take 1 tablet (100 mg total) by mouth 2 (two) times daily. 180 tablet 3   folic acid (FOLVITE) 1 MG tablet Take 2 tablets (2 mg total) by mouth daily. 180 tablet 3   HYDROcodone-acetaminophen (NORCO/VICODIN) 5-325 MG tablet Take 2 tablets by mouth every 4 (four) hours as needed. 30 tablet 0   Krill Oil 500 MG CAPS Take 500 mg by mouth daily.     levothyroxine (SYNTHROID) 125 MCG tablet Take 1 tablet (125 mcg total) by mouth daily before breakfast. 90 tablet 0   magnesium oxide (MAG-OX) 400 (240 Mg) MG tablet Take 400 mg by mouth daily.     methotrexate (RHEUMATREX) 2.5 MG tablet Take 8 tablets (20 mg total) by mouth once a week. Caution:Chemotherapy. Protect from light. 96 tablet 0   Multiple Vitamins-Minerals (PRESERVISION AREDS 2+MULTI VIT PO) Take 1 capsule by mouth daily.     nystatin (MYCOSTATIN/NYSTOP) powder Apply 1 Application topically 3 (three) times  daily. Use as needed for yeast infection on skin 30 g 2   omeprazole (PRILOSEC) 40 MG capsule Take 1 capsule (40 mg total) by mouth in the morning and at bedtime. 180 capsule 1   ondansetron (ZOFRAN) 8 MG tablet Take 1 tablet (8 mg total) by mouth every 8 (eight) hours as needed for nausea or vomiting. 30 tablet 2   Propylene Glycol (SYSTANE BALANCE) 0.6 % SOLN Place 1 drop into both eyes as needed (dry eyes).     rivaroxaban (XARELTO) 20 MG TABS tablet Take 1 tablet (20 mg total) by mouth daily with supper. 14 tablet 0   rosuvastatin (CRESTOR) 20 MG tablet TAKE 1 TABLET BY MOUTH EVERY DAY 90 tablet 3   traZODone (DESYREL) 50 MG tablet TAKE 1.5 TABLETS BY MOUTH AT BEDTIME. 135 tablet 3   venlafaxine XR (EFFEXOR XR) 75 MG 24 hr capsule Take 1 capsule (75 mg total) by mouth daily with breakfast. 30 capsule 3   guaiFENesin (MUCINEX) 600 MG 12 hr tablet Take 2 tablets (1,200 mg total) by mouth 2 (two) times daily. (Patient not taking: Reported on 05/18/2023) 30 tablet 0   No current facility-administered medications for this visit.     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   Pre-diabetes     Past Surgical History:  Procedure Laterality Date   BREAST BIOPSY Left    needle core biopsy, benign   BRONCHIAL BIOPSY  06/15/2022   Procedure: BRONCHIAL BIOPSIES;  Surgeon: Charlott Holler, MD;  Location: [facility] ENDOSCOPY;  Service: Pulmonary;;   BRONCHIAL BIOPSY   06/29/2022   Procedure: BRONCHIAL BIOPSIES;  Surgeon: Josephine Igo, DO;  Location: [facility] ENDOSCOPY;  Service: Pulmonary;;   BRONCHIAL NEEDLE ASPIRATION BIOPSY  06/15/2022   Procedure: BRONCHIAL NEEDLE ASPIRATION BIOPSIES;  Surgeon: Charlott Holler, MD;  Location: [facility] ENDOSCOPY;  Service: Pulmonary;;   BRONCHIAL NEEDLE ASPIRATION BIOPSY  06/29/2022   Procedure: BRONCHIAL NEEDLE ASPIRATION BIOPSIES;  Surgeon: Josephine Igo, DO;  Location: [facility] ENDOSCOPY;  Service: Pulmonary;;   BRONCHIAL WASHINGS  06/15/2022   Procedure: BRONCHIAL WASHINGS;  Surgeon: Charlott Holler, MD;  Location: [facility] ENDOSCOPY;  Service: Pulmonary;;   CATARACT EXTRACTION     DILATION AND CURETTAGE OF UTERUS     EYE SURGERY     HAND SURGERY     KNEE SURGERY     ROTATOR CUFF REPAIR     TOTAL ABDOMINAL HYSTERECTOMY     VIDEO BRONCHOSCOPY N/A 06/15/2022   Procedure: VIDEO BRONCHOSCOPY WITH FLUORO;  Surgeon: Charlott Holler, MD;  Location: [facility] ENDOSCOPY;  Service: Pulmonary;  Laterality: N/A;   VIDEO BRONCHOSCOPY WITH ENDOBRONCHIAL ULTRASOUND  06/15/2022   Procedure: VIDEO BRONCHOSCOPY WITH ENDOBRONCHIAL ULTRASOUND;  Surgeon: Charlott Holler, MD;  Location: [facility] ENDOSCOPY;  Service: Pulmonary;;   VIDEO BRONCHOSCOPY WITH ENDOBRONCHIAL ULTRASOUND N/A 06/29/2022   Procedure: VIDEO BRONCHOSCOPY WITH ENDOBRONCHIAL ULTRASOUND;  Surgeon: Josephine Igo, DO;  Location: [facility] ENDOSCOPY;  Service: Pulmonary;  Laterality: N/A;    Social History   Socioeconomic History   Marital status: Married    Spouse name: John   Number of children: 1   Years of education: [employer] education level: Not on file  Occupational History   Occupation: Retired    Associate Professor: OTHER    Comment: [employer]  Tobacco Use   Smoking status: Never    Passive exposure: Current (doesn't smoke in house now or past 45yrs)   Smokeless tobacco: Never  Vaping Use   Vaping status: Never Used  Substance and Sexual Activity   Alcohol use: Yes     Comment: rare   Drug use: No   Sexual activity: Not on file  Other Topics Concern   Not on file  Social History Narrative   Patient lives at home spouse.   Caffeine use: 2 sodas weekly   Right handed    Social Determinants of Health   Financial Resource Strain: Low Risk  (10/27/2021)   Overall Financial Resource Strain (CARDIA)    Difficulty of Paying Living Expenses: Not hard at all  Food Insecurity: No Food Insecurity (10/27/2021)   Hunger Vital Sign    Worried About Running Out of Food in the Last Year: Never true    Ran Out of Food in the Last Year: Never true  Transportation Needs: No Transportation Needs (10/27/2021)   PRAPARE -  Administrator, Civil Service (Medical): No    Lack of Transportation (Non-Medical): No  Physical Activity: Inactive (09/26/2018)   Exercise Vital Sign    Days of Exercise per Week: 0 days    Minutes of Exercise per Session: 0 min  Stress: No Stress Concern Present (10/27/2021)   [employer] of Occupational Health - Occupational Stress Questionnaire    Feeling of Stress : Only a little  Social Connections: Socially Integrated (10/27/2021)   Social Connection and Isolation Panel [NHANES]    Frequency of Communication with Friends and Family: Once a week    Frequency of Social Gatherings with Friends and Family: More than three times a week    Attends Religious Services: More than 4 times per year    Active Member of [employer] or Organizations: Yes    Attends Engineer, structural: More than 4 times per year    Marital Status: Married  Catering manager Violence: Not At Risk (10/27/2021)   Humiliation, Afraid, Rape, and Kick questionnaire    Fear of Current or Ex-Partner: No    Emotionally Abused: No    Physically Abused: No    Sexually Abused: No    Family History  Problem Relation Age of Onset   Atrial fibrillation Mother    Congestive Heart Failure Mother    Depression Mother    Heart disease Mother    Stroke Mother     Thyroid disease Mother    Anxiety disorder Mother    Obesity Mother    Heart disease Father    Alcohol abuse Father    High blood pressure Father    High Cholesterol Father    Alcoholism Father    Depression Sister    Atrial fibrillation Sister    Heart attack Sister    Heart disease Brother    Heart disease Brother    Depression Daughter     ROS: no fevers or chills, productive cough, hemoptysis, dysphasia, odynophagia, melena, hematochezia, dysuria, hematuria, rash, seizure activity, orthopnea, PND, pedal edema, claudication. Remaining systems are negative.  Physical Exam: Well-developed well-nourished in no acute distress.  Skin is warm and dry.  HEENT is normal.  Neck is supple.  Chest is clear to auscultation with normal expansion.  Cardiovascular exam is regular rate and rhythm.  Abdominal exam nontender or distended. No masses palpated. Extremities show no edema. neuro grossly intact  EKG Interpretation Date/Time:  Friday May 18 2023 09:48:27 EDT Ventricular Rate:  57 PR Interval:  234 QRS Duration:  96 QT Interval:  460 QTC Calculation: 447 R Axis:   30  Text Interpretation: Sinus bradycardia with 1st degree A-V block Confirmed by Olga Millers ([id number]) on 05/18/2023 9:50:11 AM     A/P  1 paroxysmal atrial fibrillation-patient remains in sinus rhythm.  Continue flecainide and atenolol.  Continue Xarelto.  2 hypertension-blood pressure controlled.  Continue present medical regimen.  3 hyperlipidemia-continue Crestor.  4 sarcoidosis-managed by pulmonary.  Olga Millers, MD

## 2023-05-15 DIAGNOSIS — F331 Major depressive disorder, recurrent, moderate: Secondary | ICD-10-CM | POA: Diagnosis not present

## 2023-05-17 ENCOUNTER — Ambulatory Visit (INDEPENDENT_AMBULATORY_CARE_PROVIDER_SITE_OTHER): Payer: Medicare Other | Admitting: Nurse Practitioner

## 2023-05-17 ENCOUNTER — Encounter: Payer: Self-pay | Admitting: Nurse Practitioner

## 2023-05-17 VITALS — BP 112/80 | HR 58 | Temp 98.8°F | Ht 61.0 in | Wt 167.0 lb

## 2023-05-17 DIAGNOSIS — E669 Obesity, unspecified: Secondary | ICD-10-CM | POA: Diagnosis not present

## 2023-05-17 DIAGNOSIS — Z6831 Body mass index (BMI) 31.0-31.9, adult: Secondary | ICD-10-CM | POA: Diagnosis not present

## 2023-05-17 DIAGNOSIS — F320 Major depressive disorder, single episode, mild: Secondary | ICD-10-CM

## 2023-05-17 NOTE — Patient Instructions (Signed)

## 2023-05-17 NOTE — Progress Notes (Signed)
Office: [phone]  /  Fax: [phone]  WEIGHT SUMMARY AND BIOMETRICS  Weight Lost Since Last Visit: 1lb  Weight Gained Since Last Visit: 0lb   Vitals Temp: 98.8 F (37.1 C) BP: 112/80 Pulse Rate: (!) 58 SpO2: 94 %   Anthropometric Measurements Height: 5\' 1"  (1.549 m) Weight: 167 lb (75.8 kg) BMI (Calculated): 31.57 Weight at Last Visit: 168lb Weight Lost Since Last Visit: 1lb Weight Gained Since Last Visit: 0lb Starting Weight: 168lb Total Weight Loss (lbs): 1 lb (0.454 kg)   Body Composition  Body Fat %: 45.5 % Fat Mass (lbs): 76.2 lbs Muscle Mass (lbs): 86.8 lbs Total Body Water (lbs): 61.2 lbs Visceral Fat Rating : 13   Other Clinical Data Fasting: no Labs: no Today's Visit #: 22 Starting Date: 01/05/21     HPI  Chief Complaint: OBESITY  Lisa Acosta is here to discuss her progress with her obesity treatment plan. She is on the practicing portion control and making smarter food choices, such as increasing vegetables and decreasing simple carbohydrates and states she is following her eating plan approximately 80 % for the past week, prior had Covid and was not following plan.  She states she is exercising 60 minutes 1 days per week.  Interval History:  Since last office visit she has lost 1 pound.  She had Covid since her last visit and was seen in the ER on 04/07/23. She was not following the plan or exercising due to not feeling well.  She is overall feeling better now and plans to get back on track.  She doesn't know what to eat anymore and is struggling with finding food she likes.  Her husband cooks and notes his food is not as good as it was in the past.  Would like to use more vegetarian options for protein.      Pharmacotherapy for weight loss: She is not currently taking medications  for medical weight loss.   Depression Saw PCP last on 05/15/23.  She is tapering off Prozac and is planning to start Effexor.   She is seeing a therapist every couple of  weeks.    PHYSICAL EXAM:  Blood pressure 112/80, pulse (!) 58, temperature 98.8 F (37.1 C), height 5\' 1"  (1.549 m), weight 167 lb (75.8 kg), SpO2 94%. Body mass index is 31.55 kg/m.  General: She is overweight, cooperative, alert, well developed, and in no acute distress. PSYCH: Has normal mood, affect and thought process.   Extremities: No edema.  Neurologic: No gross sensory or motor deficits. No tremors or fasciculations noted.    DIAGNOSTIC DATA REVIEWED:  BMET    Component Value Date/Time   NA 142 03/08/2023 1012   K 4.4 03/08/2023 1012   CL 104 03/08/2023 1012   CO2 25 03/08/2023 1012   GLUCOSE 94 03/08/2023 1012   GLUCOSE 120 (H) 06/15/2022 0935   BUN 19 03/08/2023 1012   CREATININE 0.77 03/08/2023 1012   CALCIUM 9.6 03/08/2023 1012   GFRNONAA >60 06/15/2022 0935   GFRAA >60 09/24/2019 1224   Lab Results  Component Value Date   HGBA1C 5.7 01/10/2023   HGBA1C 6.0 01/22/2017   Lab Results  Component Value Date   INSULIN 20.4 01/05/2021   Lab Results  Component Value Date   TSH 1.13 01/10/2023   CBC    Component Value Date/Time   WBC 6.9 03/08/2023 1012   WBC 6.7 06/15/2022 0935   RBC 4.26 03/08/2023 1012   RBC 4.43 06/15/2022 0935  HGB 13.2 03/08/2023 1012   HCT 40.0 03/08/2023 1012   PLT 295 10/05/2022 1324   MCV 94 03/08/2023 1012   MCH 31.0 03/08/2023 1012   MCH 29.6 06/15/2022 0935   MCHC 33.0 03/08/2023 1012   MCHC 32.7 06/15/2022 0935   RDW 13.2 03/08/2023 1012   Iron Studies No results found for: "IRON", "TIBC", "FERRITIN", "IRONPCTSAT" Lipid Panel     Component Value Date/Time   CHOL 151 01/10/2023 1430   CHOL 186 01/05/2021 1040   TRIG 74.0 01/10/2023 1430   HDL 66.70 01/10/2023 1430   HDL 67 01/05/2021 1040   CHOLHDL 2 01/10/2023 1430   VLDL 14.8 01/10/2023 1430   LDLCALC 70 01/10/2023 1430   LDLCALC 95 01/05/2021 1040   LDLCALC 103 (H) 06/23/2020 1148   LDLDIRECT 112.0 01/22/2017 1559   Hepatic Function Panel      Component Value Date/Time   PROT 6.8 03/08/2023 1012   ALBUMIN 4.3 03/08/2023 1012   AST 31 03/08/2023 1012   ALT 32 03/08/2023 1012   ALKPHOS 89 03/08/2023 1012   BILITOT 0.7 03/08/2023 1012   BILIDIR 0.1 06/23/2020 1148   BILIDIR 0.24 04/30/2020 1048   IBILI 0.7 06/23/2020 1148      Component Value Date/Time   TSH 1.13 01/10/2023 1430   Nutritional Lab Results  Component Value Date   VD25OH 79.10 11/16/2021   VD25OH 25.0 (L) 01/05/2021     ASSESSMENT AND PLAN  TREATMENT PLAN FOR OBESITY:  Recommended Dietary Goals  Lisa Acosta is currently in the action stage of change. As such, her goal is to continue weight management plan. She has agreed to practicing portion control and making smarter food choices, such as increasing vegetables and decreasing simple carbohydrates.  Behavioral Intervention  We discussed the following Behavioral Modification Strategies today: increasing lean protein intake, decreasing simple carbohydrates , increasing vegetables, increasing lower glycemic fruits, increasing water intake, work on meal planning and preparation, reading food labels , keeping healthy foods at home, continue to practice mindfulness when eating, and planning for success.  Additional resources provided today: multiple recipes given today, protein shake options, vegetarian meal plan given   Recommended Physical Activity Goals  Lisa Acosta has been advised to work up to 150 minutes of moderate intensity aerobic activity a week and strengthening exercises 2-3 times per week for cardiovascular health, weight loss maintenance and preservation of muscle mass.   She has agreed to Think about ways to increase daily physical activity and overcoming barriers to exercise and Increase physical activity in their day and reduce sedentary time (increase NEAT).   ASSOCIATED CONDITIONS ADDRESSED TODAY  Action/Plan  Depression, major, single episode, mild (HCC) Continue to follow up with PCP and  therapist.    Generalized obesity  BMI 31.0-31.9,adult      Keep appt with cardiology tomorrow and pulmonary on 06/08/23   Return in about 6 weeks (around 06/28/2023).[name] She was informed of the importance of frequent follow up visits to maximize her success with intensive lifestyle modifications for her multiple health conditions.   ATTESTASTION STATEMENTS:  Reviewed by clinician on day of visit: allergies, medications, problem list, medical history, surgical history, family history, social history, and previous encounter notes.   Time spent on visit including pre-visit chart review and post-visit care and charting was 30 minutes.    Theodis Sato. Cyril Railey FNP-C

## 2023-05-18 ENCOUNTER — Encounter: Payer: Self-pay | Admitting: Cardiology

## 2023-05-18 ENCOUNTER — Ambulatory Visit: Payer: Medicare Other | Attending: Cardiology | Admitting: Cardiology

## 2023-05-18 VITALS — BP 108/64 | HR 57 | Ht 61.0 in | Wt 170.8 lb

## 2023-05-18 DIAGNOSIS — I480 Paroxysmal atrial fibrillation: Secondary | ICD-10-CM | POA: Insufficient documentation

## 2023-05-18 DIAGNOSIS — I10 Essential (primary) hypertension: Secondary | ICD-10-CM | POA: Diagnosis not present

## 2023-05-18 DIAGNOSIS — E7800 Pure hypercholesterolemia, unspecified: Secondary | ICD-10-CM | POA: Insufficient documentation

## 2023-05-18 NOTE — Patient Instructions (Signed)
Medication Instructions:  Your physician recommends that you continue on your current medications as directed. Please refer to the Current Medication list given to you today.  *If you need a refill on your cardiac medications before your next appointment, please call your pharmacy*   Lab Work: None ordered '  If you have labs (blood work) drawn today and your tests are completely normal, you will receive your results only by: MyChart Message (if you have MyChart) OR A paper copy in the mail If you have any lab test that is abnormal or we need to change your treatment, we will call you to review the results.   Testing/Procedures: None ordered    Follow-Up: At [facility], you and your health needs are our priority.  As part of our continuing mission to provide you with exceptional heart care, we have created designated Provider Care Teams.  These Care Teams include your primary Cardiologist (physician) and Advanced Practice Providers (APPs -  Physician Assistants and Nurse Practitioners) who all work together to provide you with the care you need, when you need it.  We recommend signing up for the patient portal called "MyChart".  Sign up information is provided on this After Visit Summary.  MyChart is used to connect with patients for Virtual Visits (Telemedicine).  Patients are able to view lab/test results, encounter notes, upcoming appointments, etc.  Non-urgent messages can be sent to your provider as well.   To learn more about what you can do with MyChart, go to [email or web address].    Your next appointment:   12 month(s)  Provider:   Olga Millers, MD     Other Instructions

## 2023-05-31 ENCOUNTER — Encounter: Payer: Self-pay | Admitting: Family Medicine

## 2023-06-01 ENCOUNTER — Other Ambulatory Visit (HOSPITAL_BASED_OUTPATIENT_CLINIC_OR_DEPARTMENT_OTHER): Payer: Self-pay | Admitting: Pulmonary Disease

## 2023-06-01 DIAGNOSIS — F331 Major depressive disorder, recurrent, moderate: Secondary | ICD-10-CM | POA: Diagnosis not present

## 2023-06-03 ENCOUNTER — Other Ambulatory Visit: Payer: Self-pay | Admitting: Family Medicine

## 2023-06-03 DIAGNOSIS — F320 Major depressive disorder, single episode, mild: Secondary | ICD-10-CM

## 2023-06-04 DIAGNOSIS — M1711 Unilateral primary osteoarthritis, right knee: Secondary | ICD-10-CM | POA: Diagnosis not present

## 2023-06-08 ENCOUNTER — Encounter (HOSPITAL_BASED_OUTPATIENT_CLINIC_OR_DEPARTMENT_OTHER): Payer: Self-pay | Admitting: Pulmonary Disease

## 2023-06-08 ENCOUNTER — Ambulatory Visit (INDEPENDENT_AMBULATORY_CARE_PROVIDER_SITE_OTHER): Payer: Medicare Other | Admitting: Pulmonary Disease

## 2023-06-08 VITALS — BP 116/68 | HR 55 | Resp 16 | Ht 61.0 in | Wt 178.6 lb

## 2023-06-08 DIAGNOSIS — D869 Sarcoidosis, unspecified: Secondary | ICD-10-CM

## 2023-06-08 DIAGNOSIS — G4734 Idiopathic sleep related nonobstructive alveolar hypoventilation: Secondary | ICD-10-CM | POA: Diagnosis not present

## 2023-06-08 DIAGNOSIS — M1711 Unilateral primary osteoarthritis, right knee: Secondary | ICD-10-CM | POA: Diagnosis not present

## 2023-06-08 NOTE — Progress Notes (Unsigned)
Subjective:   PATIENT ID: Lisa Acosta GENDER: female DOB: 08-04-1951, MRN: [id number]  Chief Complaint  Patient presents with   Follow-up    Sarcoidosis- breathing is fine. Had covid over summer fever of 103 and cough for almost a  month.     Reason for Visit: Follow-up  [name] is a 72 year old female never smoker with pulmonary sarcoid GERD, HTN, HLD, atrial fibrillation who presents for follow-up.  Initial consult 10/05/22 She was previously seen by Dr. Tonia Brooms for pulmonary sarcoid and started on steroids, prednisone 30 mg daily. Since starting this she has had 20 lb weight gain and headaches, irritiability and darker stool per 09/18/22 telephone note. She was advised to hold. Her cough has returned and is improved but persistent. Cough had initially started six months ago.  She reports significant fatigue and prior sleep studies have been negative. She is wearing oxygen nightly.  01/05/23 Since our last visit she was started on methotrexate in Feb 11/04/22. Currently on 8 tablets (20 mg weekly). She is still feeling fatigued and tired. She is having confusion versus starting methotrexate. Unsure if this is related to the Alzheimer's. Her cough has improved but occasional. Denies shortness of breath or wheezing.  03/08/23 Since our last visit we had decreased methotrexate to 15 mg weekly. Cough has returned but not as bad however the associated mucous production has improved. Does not feel confused has improved with dosage.   06/08/23 Since our last visit she had covid in July and seen in the ED. She reports nonproductive cough and sometimes expel sputum that is unchanged in the last year. Denies mucous production from lungs but reports copious mucous from her mouth that is worse than last visit. Denies nasal drainage. Does have reflux and on PPI BID. Reports low grade fevers. Compliant with oxygen at night.   Social History: Never smoker. 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   Pre-diabetes      Family History  Problem Relation Age of Onset   Atrial fibrillation Mother    Congestive Heart Failure Mother    Depression Mother    Heart disease Mother    Stroke Mother    Thyroid disease Mother    Anxiety disorder Mother    Obesity Mother    Heart disease Father    Alcohol abuse Father    High blood pressure Father    High Cholesterol Father    Alcoholism Father    Depression Sister    Atrial fibrillation Sister    Heart attack Sister    Heart disease Brother    Heart disease Brother    Depression Daughter      Social History   Occupational History   Occupation: Retired    Associate Professor: OTHER    Comment: High  [employer]  Tobacco Use   Smoking status: Never    Passive exposure: Current (doesn't smoke in house now or past 31yrs)   Smokeless tobacco: Never  Vaping Use   Vaping status: Never Used  Substance and Sexual Activity   Alcohol use: Yes    Comment: rare   Drug use: No   Sexual activity: Not on file    Allergies  Allergen Reactions   Dextran Anaphylaxis   Tree Extract    Penicillin G Rash   Penicillins Rash   Sulfa Antibiotics Rash and Other (See Comments)   Wound Dressing Adhesive Rash     Outpatient Medications Prior to Visit  Medication Sig Dispense Refill   amLODipine (NORVASC) 5 MG tablet TAKE 1 TABLET (5 MG TOTAL) BY MOUTH DAILY. 90 tablet 0    atenolol (TENORMIN) 50 MG tablet TAKE 1 TABLET BY MOUTH EVERY DAY 90 tablet 1   Calcium Carb-Cholecalciferol (CALCIUM 600 + D PO) Take 1 tablet by mouth in the morning and at bedtime.     flecainide (TAMBOCOR) 100 MG tablet Take 1 tablet (100 mg total) by mouth 2 (two) times daily. 180 tablet 3   FLUoxetine (PROZAC) 40 MG capsule Take 40 mg by mouth daily.     folic acid (FOLVITE) 1 MG tablet Take 2 tablets (2 mg total) by mouth daily. 180 tablet 3   guaiFENesin (MUCINEX) 600 MG 12 hr tablet Take 2 tablets (1,200 mg total) by mouth 2 (two) times daily. 30 tablet 0   HYDROcodone-acetaminophen (NORCO/VICODIN) 5-325 MG tablet Take 2 tablets by mouth every 4 (four) hours as needed. 30 tablet 0   Krill Oil 500 MG CAPS Take 500 mg by mouth daily.     levothyroxine (SYNTHROID) 125 MCG tablet Take 1 tablet (125 mcg total) by mouth daily before breakfast. 90 tablet 0   magnesium oxide (MAG-OX) 400 (240 Mg) MG tablet Take 400 mg by mouth daily.     methotrexate (RHEUMATREX) 2.5 MG tablet TAKE 8 TABLETS (20 MG TOTAL) BY MOUTH ONCE A WEEK. CAUTION:CHEMOTHERAPY. PROTECT FROM LIGHT. 96 tablet 0   Multiple Vitamins-Minerals (PRESERVISION AREDS 2+MULTI VIT PO) Take 1 capsule by mouth daily.     nystatin (MYCOSTATIN/NYSTOP) powder Apply 1 Application topically 3 (three) times daily. Use as needed for yeast infection on skin 30 g 2   omeprazole (PRILOSEC) 40 MG capsule Take 1 capsule (40 mg total) by mouth in the morning and at bedtime. 180 capsule 1   ondansetron (ZOFRAN) 8 MG tablet Take 1 tablet (8 mg total) by mouth every 8 (eight) hours as needed for nausea or vomiting. 30 tablet 2   Propylene Glycol (SYSTANE BALANCE) 0.6 % SOLN Place 1 drop into both eyes as needed (dry eyes).     rivaroxaban (XARELTO) 20 MG TABS tablet Take 1 tablet (20 mg total) by mouth daily with supper. 14 tablet 0   rosuvastatin (CRESTOR) 20 MG tablet TAKE 1 TABLET BY MOUTH EVERY DAY 90 tablet 3   traZODone (DESYREL) 50 MG tablet TAKE  1.5 TABLETS BY MOUTH AT BEDTIME. 135 tablet 3   venlafaxine XR (EFFEXOR-XR) 75 MG 24 hr capsule Take 1 capsule (75 mg total) by mouth daily with breakfast. 90 capsule 1   No facility-administered medications prior to visit.    Review of Systems  Constitutional:  Negative for chills, diaphoresis, fever, malaise/fatigue and weight loss.  HENT:  Negative for congestion.   Respiratory:  Positive for sputum production. Negative for cough, hemoptysis, shortness of  breath and wheezing.   Cardiovascular:  Negative for chest pain, palpitations and leg swelling.     Objective:   Vitals:   06/08/23 1434  BP: 116/68  Pulse: (!) 55  Resp: 16  SpO2: 95%  Weight: 178 lb 9.6 oz (81 kg)  Height: 5\' 1"  (1.549 m)   SpO2: 95 %  Physical Exam: General: Well-appearing, no acute distress HENT: [place], AT Eyes: EOMI, no scleral icterus Respiratory: Clear to auscultation bilaterally.  No crackles, wheezing or rales Cardiovascular: RRR, -M/R/G, no JVD Extremities:-Edema,-tenderness Neuro: AAO x4, CNII-XII grossly intact Psych: Normal mood, normal affect   Data Reviewed:  Imaging: PET 06/07/22 - Hypermetabolic masslike consolidation in LUL, hypermetabolic left supraclavicular, hilar and mediastinal adenopathy. RUL nodule 12 mm new CT Super D 06/26/22 - LUL lung mass with GGO. RUL nodule resolved CT Chest 08/14/22 - Stable LUL focal opacity with surrounding GGO, new reticular opacities in lung bases, stable left supraclavicular, left hilar and prevascular lymph nodes CT Chest 02/10/23 - Slightly decreased LUL with surrounding GGO. Unchanged lower lobe reticulations. Stable mediastinal and hilar lymph nodes. Stable 6mm RML nodule  PFT: 08/29/16  FVC 2.63 (95%) FEV1 2.21 (105%) Ratio 84  TLC 91% DLCO 75% Interpretation: Normal spirometry and lung volumes. Mildly reduced DLCO.  10/05/22 FVC 2.37 (92%) FEV1 2.02 (104%) Ratio 85  TLC 87% DLCO 65% Interpretation: Normal spirometry and lung volumes.  Compared to prior reduced DLCO, remains mild   Labs:    Latest Ref Rng & Units 03/08/2023   10:12 AM 01/05/2023   11:04 AM 10/05/2022    1:24 PM  CBC  WBC 3.4 - 10.8 x10E3/uL 6.9  7.5  8.1   Hemoglobin 11.1 - 15.9 g/dL 16.1  09.6  04.5   Hematocrit 34.0 - 46.6 % 40.0  41.4  39.3   Platelets 150 - 450 x10E3/uL   295       Latest Ref Rng & Units 03/08/2023   10:12 AM 01/05/2023   11:04 AM 10/05/2022    1:24 PM  CMP  Glucose 70 - 99 mg/dL 94  409  811   BUN 8 - 27 mg/dL 19  23  25    Creatinine 0.57 - 1.00 mg/dL 9.14  7.82  9.56   Sodium 134 - 144 mmol/L 142  141  139   Potassium 3.5 - 5.2 mmol/L 4.4  4.8  4.1   Chloride 96 - 106 mmol/L 104  104  100   CO2 20 - 29 mmol/L 25  22  22    Calcium 8.7 - 10.3 mg/dL 9.6  9.8  9.5   Total Protein 6.0 - 8.5 g/dL 6.8  6.8  6.6   Total Bilirubin 0.0 - 1.2 mg/dL 0.7  0.8  0.5   Alkaline Phos 44 - 121 IU/L 89  91  77   AST 0 - 40 IU/L 31  42  26   ALT 0 - 32 IU/L 32  35  19    Pathology: 06/29/22 LUL Needle, endobronchial biopsy- Numerous noncaseating granulomas  Sleep: PSG 03/30/22 - No sleep apnea. Nocturnal hypoxemia  ONO 08/11/23 SpO2 <88% 1 hour and 7 min. Average SpO2 89%. Nadir SpO2 72% Interpretation: Qualifies for oxygen     Assessment & Plan:   Discussion: 72 year old female never smoker with pulmonary sarcoid GERD, HTN, HLD, atrial fibrillation who presents for follow-up  We discussed the clinical course of sarcoid and management including serial PFTs, labs, eye exam, and EKG and chest imaging if  indicated. Currently on methotrexate for sarcoid flare. She does complain of excessive saliva production. Unclear etiology.  CT Chest 02/10/23 with improved LUL consolidation consistent with sarcoid improvement on methotrexate. Unchanged/slightly improved reticulation in the lower portion of the lungs. Cough has returned on reduced methotrexate dose.  Confusion - unchanged of reduced methotrexate dosing May be medication related in  setting of mild dementia --Discussed risks and benefits of methotrexate and after shared decision making will return to higher dose of methotrexate --However family will let me know if confusion worsens on increased dose  Pulmonary sarcoidosis - active flare Chronic cough --Dx in 05/29/22 and 06/15/22 via endobronchial bx --PET/CT 06/07/22 Hypermetabolic masslike consolidation with mediastinal/hilar adenopathy --CT Chest 02/10/23 Improved LUL consolidation --ORDER CT Chest for December 2024 --Mucinex as directed for sputum production  History of immunosuppression High risk medication management --06/2022-09/2022 Prednisone 30 mg. Discontinued d/t agitation, HA. No taper --Off steroids --CONTINUE methotrexate to 8 tablets (20 mg) weekly. Generic preferred --Continue folic acid 2 mg daily --ORDER CBC with diff, CMP today and q3 months. Labs today  Sarcoid Monitoring --Recent chest imaging reviewed. Plan for repeat imaging after on stable methotrexate dosing --Annual PFTs.  Last PFTs 09/2022. Mildly reduced DLCO --Annual ophthalmology exam. Last exam 12/26/22 --Followed by EP at [facility]. --Routine labs as needed: CBC with diff, CMET, 1, 25 and 25 hydroxy vitamin D, urinary calcium  Nocturnal hypoxemia Hx OSA --Re-ORDER ONO. If qualifies go ahead and order and place comment for travel/portable nocturnal oxygen --Continue oxygen 2L nightly --Please contact your DME company for oxygen access when traveling to other cities  Health Maintenance Immunization History  Administered Date(s) Administered   Fluad Quad(high Dose 65+) 05/02/2019, 06/23/2020, 06/30/2021, 05/29/2022   Influenza, High Dose Seasonal PF 06/14/2016, 05/15/2018   Influenza,inj,Quad PF,6+ Mos 06/11/2014   Influenza,inj,quad, With Preservative 06/11/2017   PFIZER(Purple Top)SARS-COV-2 Vaccination 10/03/2019, 10/24/2019, 06/17/2020, 03/16/2021   Pfizer Covid-19 Vaccine Bivalent Booster 76yrs & up 08/02/2021   Pneumococcal  Conjugate-13 08/07/2016   Pneumococcal Polysaccharide-23 01/28/2018   Tdap 01/28/2018   CT Lung Screen - not qualified. Serial imaging as noted above  Orders Placed This Encounter  Procedures   CT Chest Wo Contrast    Standing Status:   Future    Standing Expiration Date:   06/07/2024    Scheduling Instructions:     Schedule the first week of December 2024    Order Specific Question:   Preferred imaging location?    Answer:   [facility]   CBC With Differential   Comp Met (CMET)   No orders of the defined types were placed in this encounter.   Return in about 2 months (around 08/08/2023).  I have spent a total time of 35-minutes on the day of the appointment including chart review, data review, collecting history, coordinating care and discussing medical diagnosis and plan with the patient/family. Past medical history, allergies, medications were reviewed. Pertinent imaging, labs and tests included in this note have been reviewed and interpreted independently by me.  Ahmyah Gidley Mechele Collin, MD [facility] 06/08/2023 3:29 PM  Office Number [phone]

## 2023-06-08 NOTE — Patient Instructions (Addendum)
Pulmonary sarcoidosis --ORDER CT Chest for December 2024 --CONTINUE methotrexate to 8 tablets (20 mg) weekly. Generic preferred --Continue folic acid 2 mg daily --ORDER CBC with diff, CMP today and q3 months. Labs today  Nocturnal hypoxemia Hx OSA --Re-ORDER ONO. If qualifies go ahead and order and place comment for travel/portable nocturnal oxygen --Continue oxygen 2L nightly --Please contact your DME company for oxygen access when traveling to other cities

## 2023-06-09 LAB — COMPREHENSIVE METABOLIC PANEL
ALT: 23 [IU]/L (ref 0–32)
AST: 28 [IU]/L (ref 0–40)
Albumin: 4.5 g/dL (ref 3.8–4.8)
Alkaline Phosphatase: 91 [IU]/L (ref 44–121)
BUN/Creatinine Ratio: 33 — ABNORMAL HIGH (ref 12–28)
BUN: 29 mg/dL — ABNORMAL HIGH (ref 8–27)
Bilirubin Total: 0.9 mg/dL (ref 0.0–1.2)
CO2: 24 mmol/L (ref 20–29)
Calcium: 10.1 mg/dL (ref 8.7–10.3)
Chloride: 99 mmol/L (ref 96–106)
Creatinine, Ser: 0.89 mg/dL (ref 0.57–1.00)
Globulin, Total: 2.6 g/dL (ref 1.5–4.5)
Glucose: 99 mg/dL (ref 70–99)
Potassium: 4.5 mmol/L (ref 3.5–5.2)
Sodium: 141 mmol/L (ref 134–144)
Total Protein: 7.1 g/dL (ref 6.0–8.5)
eGFR: 68 mL/min/{1.73_m2} (ref >59)

## 2023-06-09 LAB — CBC WITH DIFFERENTIAL/PLATELET
Basophils Absolute: 0.1 10*3/uL (ref 0.0–0.2)
Basos: 1 %
EOS (ABSOLUTE): 0.2 10*3/uL (ref 0.0–0.4)
Eos: 2 %
Hematocrit: 41.6 % (ref 34.0–46.6)
Hemoglobin: 13.6 g/dL (ref 11.1–15.9)
Immature Grans (Abs): 0 10*3/uL (ref 0.0–0.1)
Immature Granulocytes: 0 %
Lymphocytes Absolute: 2 10*3/uL (ref 0.7–3.1)
Lymphs: 25 %
MCH: 31 pg (ref 26.6–33.0)
MCHC: 32.7 g/dL (ref 31.5–35.7)
MCV: 95 fL (ref 79–97)
Monocytes Absolute: 0.9 10*3/uL (ref 0.1–0.9)
Monocytes: 11 %
Neutrophils Absolute: 4.9 10*3/uL (ref 1.4–7.0)
Neutrophils: 61 %
Platelets: 276 10*3/uL (ref 150–450)
RBC: 4.39 x10E6/uL (ref 3.77–5.28)
RDW: 13.4 % (ref 11.7–15.4)
WBC: 8 10*3/uL (ref 3.4–10.8)

## 2023-06-11 ENCOUNTER — Encounter (HOSPITAL_BASED_OUTPATIENT_CLINIC_OR_DEPARTMENT_OTHER): Payer: Self-pay | Admitting: Pulmonary Disease

## 2023-06-15 DIAGNOSIS — M1711 Unilateral primary osteoarthritis, right knee: Secondary | ICD-10-CM | POA: Diagnosis not present

## 2023-06-18 ENCOUNTER — Ambulatory Visit (INDEPENDENT_AMBULATORY_CARE_PROVIDER_SITE_OTHER): Payer: Medicare Other | Admitting: Nurse Practitioner

## 2023-06-18 ENCOUNTER — Encounter: Payer: Self-pay | Admitting: Nurse Practitioner

## 2023-06-18 VITALS — BP 134/74 | HR 60 | Temp 98.5°F | Ht 61.0 in | Wt 164.0 lb

## 2023-06-18 DIAGNOSIS — F320 Major depressive disorder, single episode, mild: Secondary | ICD-10-CM

## 2023-06-18 DIAGNOSIS — E669 Obesity, unspecified: Secondary | ICD-10-CM | POA: Diagnosis not present

## 2023-06-18 DIAGNOSIS — Z6830 Body mass index (BMI) 30.0-30.9, adult: Secondary | ICD-10-CM | POA: Diagnosis not present

## 2023-06-18 NOTE — Progress Notes (Signed)
Office: [phone]  /  Fax: [phone]  WEIGHT SUMMARY AND BIOMETRICS  Weight Lost Since Last Visit: 3lb  Weight Gained Since Last Visit: 0lb   Vitals Temp: 98.5 F (36.9 C) BP: 134/74 Pulse Rate: 60 SpO2: 97 %   Anthropometric Measurements Height: 5\' 1"  (1.549 m) Weight: 164 lb (74.4 kg) BMI (Calculated): 31 Weight at Last Visit: 167lb Weight Lost Since Last Visit: 3lb Weight Gained Since Last Visit: 0lb Starting Weight: 168lb Total Weight Loss (lbs): 4 lb (1.814 kg)   Body Composition  Body Fat %: 45.2 % Fat Mass (lbs): 74.4 lbs Muscle Mass (lbs): 85.8 lbs Total Body Water (lbs): 62 lbs Visceral Fat Rating : 13   Other Clinical Data Fasting: No Labs: No Today's Visit #: 23 Starting Date: 01/05/21     HPI  Chief Complaint: OBESITY  Lisa Acosta is here to discuss her progress with her obesity treatment plan. She is on the practicing portion control and making smarter food choices, such as increasing vegetables and decreasing simple carbohydrates and states she is following her eating plan approximately 70 % of the time. She states she is exercising 60 minutes 2 days per week.   Interval History:  Since last office visit she has lost 3 pounds.  She struggles with meeting protein goals because she doesn't like/want to eat meat.  She continues to struggle with knee pain.  Saw ortho last on 06/15/23.    Pharmacotherapy for weight loss: She is not currently taking medications  for medical weight loss.    Depression She is taking Effexor XR 75mg . Denies side effects.  Feels that the Effexor has been beneficial but wonders if she needs a higher dose.  Doesn't have a follow up visit scheduled with PCP.    PHYSICAL EXAM:  Blood pressure 134/74, pulse 60, temperature 98.5 F (36.9 C), height 5\' 1"  (1.549 m), weight 164 lb (74.4 kg), SpO2 97%. Body mass index is 30.99 kg/m.  General: She is overweight, cooperative, alert, well developed, and in no acute  distress. PSYCH: Has normal mood, affect and thought process.   Extremities: No edema.  Neurologic: No gross sensory or motor deficits. No tremors or fasciculations noted.    DIAGNOSTIC DATA REVIEWED:  BMET    Component Value Date/Time   NA 141 06/08/2023 1544   K 4.5 06/08/2023 1544   CL 99 06/08/2023 1544   CO2 24 06/08/2023 1544   GLUCOSE 99 06/08/2023 1544   GLUCOSE 120 (H) 06/15/2022 0935   BUN 29 (H) 06/08/2023 1544   CREATININE 0.89 06/08/2023 1544   CALCIUM 10.1 06/08/2023 1544   GFRNONAA >60 06/15/2022 0935   GFRAA >60 09/24/2019 1224   Lab Results  Component Value Date   HGBA1C 5.7 01/10/2023   HGBA1C 6.0 01/22/2017   Lab Results  Component Value Date   INSULIN 20.4 01/05/2021   Lab Results  Component Value Date   TSH 1.13 01/10/2023   CBC    Component Value Date/Time   WBC 8.0 06/08/2023 1544   WBC 6.7 06/15/2022 0935   RBC 4.39 06/08/2023 1544   RBC 4.43 06/15/2022 0935   HGB 13.6 06/08/2023 1544   HCT 41.6 06/08/2023 1544   PLT 276 06/08/2023 1544   MCV 95 06/08/2023 1544   MCH 31.0 06/08/2023 1544   MCH 29.6 06/15/2022 0935   MCHC 32.7 06/08/2023 1544   MCHC 32.7 06/15/2022 0935   RDW 13.4 06/08/2023 1544   Iron Studies No results found for: "IRON", "TIBC", "FERRITIN", "  IRONPCTSAT" Lipid Panel     Component Value Date/Time   CHOL 151 01/10/2023 1430   CHOL 186 01/05/2021 1040   TRIG 74.0 01/10/2023 1430   HDL 66.70 01/10/2023 1430   HDL 67 01/05/2021 1040   CHOLHDL 2 01/10/2023 1430   VLDL 14.8 01/10/2023 1430   LDLCALC 70 01/10/2023 1430   LDLCALC 95 01/05/2021 1040   LDLCALC 103 (H) 06/23/2020 1148   LDLDIRECT 112.0 01/22/2017 1559   Hepatic Function Panel     Component Value Date/Time   PROT 7.1 06/08/2023 1544   ALBUMIN 4.5 06/08/2023 1544   AST 28 06/08/2023 1544   ALT 23 06/08/2023 1544   ALKPHOS 91 06/08/2023 1544   BILITOT 0.9 06/08/2023 1544   BILIDIR 0.1 06/23/2020 1148   BILIDIR 0.24 04/30/2020 1048   IBILI 0.7  06/23/2020 1148      Component Value Date/Time   TSH 1.13 01/10/2023 1430   Nutritional Lab Results  Component Value Date   VD25OH 79.10 11/16/2021   VD25OH 25.0 (L) 01/05/2021     ASSESSMENT AND PLAN  TREATMENT PLAN FOR OBESITY:  Recommended Dietary Goals  Ireta is currently in the action stage of change. As such, her goal is to continue weight management plan. She has agreed to the Vegetarian Plan.  Behavioral Intervention  We discussed the following Behavioral Modification Strategies today: increasing lean protein intake to established goals, decreasing simple carbohydrates , increasing vegetables, avoiding skipping meals, increasing water intake , reading food labels , keeping healthy foods at home, and continue to work on maintaining a reduced calorie state, getting the recommended amount of protein, incorporating whole foods, making healthy choices, staying well hydrated and practicing mindfulness when eating..  Additional resources provided today:  protein pairing sheet, protein content of food, vegetarian meal plan  Recommended Physical Activity Goals  Odaly has been advised to work up to 150 minutes of moderate intensity aerobic activity a week and strengthening exercises 2-3 times per week for cardiovascular health, weight loss maintenance and preservation of muscle mass.   She has agreed to Continue current level of physical activity , Think about enjoyable ways to increase daily physical activity and overcoming barriers to exercise, and Increase physical activity in their day and reduce sedentary time (increase NEAT).   ASSOCIATED CONDITIONS ADDRESSED TODAY  Action/Plan  Depression, major, single episode, mild (HCC) I've encouraged her to reach out and schedule an appt with her PCP.  Continue meds as directed Doing better  Generalized obesity  BMI 30.0-30.9,adult         Return in about 4 weeks (around 07/16/2023).[name] She was informed of the importance  of frequent follow up visits to maximize her success with intensive lifestyle modifications for her multiple health conditions.   ATTESTASTION STATEMENTS:  Reviewed by clinician on day of visit: allergies, medications, problem list, medical history, surgical history, family history, social history, and previous encounter notes.   Time spent on visit including pre-visit chart review and post-visit care and charting was 30 minutes.    Theodis Sato. Tamantha Saline FNP-C

## 2023-06-21 ENCOUNTER — Other Ambulatory Visit: Payer: Self-pay | Admitting: Family Medicine

## 2023-06-21 DIAGNOSIS — E034 Atrophy of thyroid (acquired): Secondary | ICD-10-CM

## 2023-06-21 DIAGNOSIS — Z23 Encounter for immunization: Secondary | ICD-10-CM | POA: Diagnosis not present

## 2023-06-22 DIAGNOSIS — M1711 Unilateral primary osteoarthritis, right knee: Secondary | ICD-10-CM | POA: Diagnosis not present

## 2023-06-25 DIAGNOSIS — F331 Major depressive disorder, recurrent, moderate: Secondary | ICD-10-CM | POA: Diagnosis not present

## 2023-06-28 ENCOUNTER — Other Ambulatory Visit: Payer: Self-pay | Admitting: Family Medicine

## 2023-06-28 DIAGNOSIS — R0902 Hypoxemia: Secondary | ICD-10-CM | POA: Diagnosis not present

## 2023-06-28 DIAGNOSIS — I480 Paroxysmal atrial fibrillation: Secondary | ICD-10-CM

## 2023-07-04 ENCOUNTER — Telehealth (HOSPITAL_BASED_OUTPATIENT_CLINIC_OR_DEPARTMENT_OTHER): Payer: Self-pay | Admitting: Pulmonary Disease

## 2023-07-04 NOTE — Telephone Encounter (Signed)
Overnight Oximetry Results Date: 06/26/23 SpO2 <88% 1 hour 34 min 37 sec.  [place] SpO2 74%  Qualified for oxygen  Assessment/Plan Nocturnal Hypoxemia --RECERTIFY for 2L O2 via [place] nightly. Patient already has nocturnal oxygen ordered, please recertify order so she can continue to receive it.

## 2023-07-09 ENCOUNTER — Other Ambulatory Visit: Payer: Self-pay

## 2023-07-09 ENCOUNTER — Telehealth: Payer: Self-pay | Admitting: Family Medicine

## 2023-07-09 MED ORDER — FLUTICASONE PROPIONATE 50 MCG/ACT NA SUSP: 2.0000 | Freq: Every day | NASAL | 1 refills | Status: DC

## 2023-07-09 NOTE — Telephone Encounter (Signed)
Patient called is needing a med refill on Flonase, please send to [facility] [address].

## 2023-07-09 NOTE — Telephone Encounter (Signed)
Rx sent 

## 2023-07-12 ENCOUNTER — Ambulatory Visit: Payer: Medicare Other | Admitting: Physician Assistant

## 2023-07-12 ENCOUNTER — Encounter: Payer: Self-pay | Admitting: Physician Assistant

## 2023-07-12 VITALS — BP 128/70 | HR 64 | Temp 98.7°F | Ht 61.0 in | Wt 168.1 lb

## 2023-07-12 DIAGNOSIS — J020 Streptococcal pharyngitis: Secondary | ICD-10-CM

## 2023-07-12 DIAGNOSIS — R6889 Other general symptoms and signs: Secondary | ICD-10-CM | POA: Diagnosis not present

## 2023-07-12 DIAGNOSIS — J029 Acute pharyngitis, unspecified: Secondary | ICD-10-CM

## 2023-07-12 LAB — POCT RAPID STREP A (OFFICE): Rapid Strep A Screen: POSITIVE — AB

## 2023-07-12 LAB — POC COVID19 BINAXNOW: SARS Coronavirus 2 Ag: NEGATIVE

## 2023-07-12 MED ORDER — AZITHROMYCIN 250 MG PO TABS
ORAL_TABLET | ORAL | 0 refills | Status: AC
Start: 2023-07-12 — End: 2023-07-17

## 2023-07-12 NOTE — Progress Notes (Signed)
Established patient visit   Patient: Lisa Acosta   DOB: 03-27-1951   72 y.o. Female  MRN: [id number] Visit Date: 07/12/2023  Today's healthcare provider: Alfredia Ferguson, PA-C  Cc. Sore throat, cough  Subjective    Pt reports a sore throat for the past few days, her chronic cough has worsened, fatigue. Some slight temps at home, 99.39F  Medications: Outpatient Medications Prior to Visit  Medication Sig   amLODipine (NORVASC) 5 MG tablet TAKE 1 TABLET (5 MG TOTAL) BY MOUTH DAILY.   atenolol (TENORMIN) 50 MG tablet TAKE 1 TABLET BY MOUTH EVERY DAY   Calcium Carb-Cholecalciferol (CALCIUM 600 + D PO) Take 1 tablet by mouth in the morning and at bedtime.   flecainide (TAMBOCOR) 100 MG tablet Take 1 tablet (100 mg total) by mouth 2 (two) times daily.   fluticasone (FLONASE) 50 MCG/ACT nasal spray Place 2 sprays into both nostrils daily.   folic acid (FOLVITE) 1 MG tablet Take 2 tablets (2 mg total) by mouth daily.   guaiFENesin (MUCINEX) 600 MG 12 hr tablet Take 2 tablets (1,200 mg total) by mouth 2 (two) times daily.   Krill Oil 500 MG CAPS Take 500 mg by mouth daily.   levothyroxine (SYNTHROID) 125 MCG tablet Take 1 tablet (125 mcg total) by mouth daily before breakfast.   magnesium oxide (MAG-OX) 400 (240 Mg) MG tablet Take 400 mg by mouth daily.   methotrexate (RHEUMATREX) 2.5 MG tablet TAKE 8 TABLETS (20 MG TOTAL) BY MOUTH ONCE A WEEK. CAUTION:CHEMOTHERAPY. PROTECT FROM LIGHT.   Multiple Vitamins-Minerals (PRESERVISION AREDS 2+MULTI VIT PO) Take 1 capsule by mouth daily.   nystatin (MYCOSTATIN/NYSTOP) powder Apply 1 Application topically 3 (three) times daily. Use as needed for yeast infection on skin   omeprazole (PRILOSEC) 40 MG capsule Take 1 capsule (40 mg total) by mouth in the morning and at bedtime.   ondansetron (ZOFRAN) 8 MG tablet Take 1 tablet (8 mg total) by mouth every 8 (eight) hours as needed for nausea or vomiting.   Propylene Glycol (SYSTANE BALANCE) 0.6 %  SOLN Place 1 drop into both eyes as needed (dry eyes).   rivaroxaban (XARELTO) 20 MG TABS tablet Take 1 tablet (20 mg total) by mouth daily with supper.   rosuvastatin (CRESTOR) 20 MG tablet TAKE 1 TABLET BY MOUTH EVERY DAY   traZODone (DESYREL) 50 MG tablet TAKE 1.5 TABLETS BY MOUTH AT BEDTIME.   venlafaxine XR (EFFEXOR-XR) 75 MG 24 hr capsule Take 1 capsule (75 mg total) by mouth daily with breakfast.   [DISCONTINUED] FLUoxetine (PROZAC) 40 MG capsule Take 40 mg by mouth daily.   [DISCONTINUED] HYDROcodone-acetaminophen (NORCO/VICODIN) 5-325 MG tablet Take 2 tablets by mouth every 4 (four) hours as needed. (Patient not taking: Reported on 06/18/2023)   No facility-administered medications prior to visit.    Review of Systems  Constitutional:  Negative for fatigue and fever.  HENT:  Positive for sore throat.   Respiratory:  Positive for cough. Negative for shortness of breath.   Cardiovascular:  Negative for chest pain and leg swelling.  Gastrointestinal:  Negative for abdominal pain.  Neurological:  Negative for dizziness and headaches.       Objective    BP 128/70   Pulse 64   Temp 98.7 F (37.1 C) (Oral)   Ht 5\' 1"  (1.549 m)   Wt 168 lb 2 oz (76.3 kg)   SpO2 95%   BMI 31.77 kg/m    Physical Exam Constitutional:  General: She is awake.     Appearance: She is well-developed.  HENT:     Head: Normocephalic.     Mouth/Throat:     Pharynx: Posterior oropharyngeal erythema present.  Eyes:     Conjunctiva/sclera: Conjunctivae normal.  Cardiovascular:     Rate and Rhythm: Normal rate and regular rhythm.     Heart sounds: Normal heart sounds.  Pulmonary:     Effort: Pulmonary effort is normal.     Breath sounds: Normal breath sounds.  Skin:    General: Skin is warm.  Neurological:     Mental Status: She is alert and oriented to person, place, and time.  Psychiatric:        Attention and Perception: Attention normal.        Mood and Affect: Mood normal.         Speech: Speech normal.        Behavior: Behavior is cooperative.      Results for orders placed or performed in visit on 07/12/23  POCT rapid strep A  Result Value Ref Range   Rapid Strep A Screen Positive (A) Negative  POC COVID-19  Result Value Ref Range   SARS Coronavirus 2 Ag Negative Negative    Assessment & Plan    Strep pharyngitis Hydrate, tylenol, rest, rx azithro -- pt reports GI issues with doxy, and all to pcn -     Azithromycin; Take 2 tablets on day 1, then 1 tablet daily on days 2 through 5  Dispense: 6 tablet; Refill: 0  Sore throat -     POCT rapid strep A  Flu-like symptoms -     POC COVID-19 BinaxNow    Return if symptoms worsen or fail to improve.       Alfredia Ferguson, PA-C  [facility] [phone] (phone) [phone] (fax)  [facility]

## 2023-07-17 ENCOUNTER — Telehealth: Payer: Self-pay | Admitting: Family Medicine

## 2023-07-17 ENCOUNTER — Ambulatory Visit: Payer: Medicare Other | Admitting: Nurse Practitioner

## 2023-07-17 NOTE — Telephone Encounter (Signed)
Pt was seen by you for this acute visit. Please advise.

## 2023-07-17 NOTE — Telephone Encounter (Signed)
Pt states she saw Lillia Abed last week for strep throat and does not feel any better. Still has a slight fever, sore throat, and trouble swallowing. She would like to know if anything else can be done. Please advise

## 2023-07-18 ENCOUNTER — Other Ambulatory Visit: Payer: Self-pay | Admitting: Physician Assistant

## 2023-07-18 ENCOUNTER — Telehealth (HOSPITAL_BASED_OUTPATIENT_CLINIC_OR_DEPARTMENT_OTHER): Payer: Self-pay | Admitting: Pulmonary Disease

## 2023-07-18 DIAGNOSIS — G4734 Idiopathic sleep related nonobstructive alveolar hypoventilation: Secondary | ICD-10-CM

## 2023-07-18 DIAGNOSIS — J020 Streptococcal pharyngitis: Secondary | ICD-10-CM

## 2023-07-18 MED ORDER — CLINDAMYCIN HCL 300 MG PO CAPS
300.0000 mg | ORAL_CAPSULE | Freq: Three times a day (TID) | ORAL | 0 refills | Status: AC
Start: 2023-07-18 — End: 2023-07-28

## 2023-07-18 NOTE — Progress Notes (Unsigned)
Has this patient's oxygen been ordered? If not, please recertify ASAP

## 2023-07-18 NOTE — Telephone Encounter (Signed)
Has this patient's oxygen been re-ordered? Please order ASAP  Nocturnal Hypoxemia --RECERTIFY for 2L O2 via [place] nightly. Patient already has nocturnal oxygen ordered, please recertify order so she can continue to receive it.

## 2023-07-18 NOTE — Telephone Encounter (Signed)
Verbalized with patient- she will pick up meds and let us know how she is feeling

## 2023-07-18 NOTE — Telephone Encounter (Signed)
Order has been placed.

## 2023-07-19 DIAGNOSIS — F331 Major depressive disorder, recurrent, moderate: Secondary | ICD-10-CM | POA: Diagnosis not present

## 2023-07-20 ENCOUNTER — Encounter: Payer: Self-pay | Admitting: Family Medicine

## 2023-07-24 DIAGNOSIS — L820 Inflamed seborrheic keratosis: Secondary | ICD-10-CM | POA: Diagnosis not present

## 2023-07-24 DIAGNOSIS — L570 Actinic keratosis: Secondary | ICD-10-CM | POA: Diagnosis not present

## 2023-07-24 DIAGNOSIS — B078 Other viral warts: Secondary | ICD-10-CM | POA: Diagnosis not present

## 2023-08-07 DIAGNOSIS — F331 Major depressive disorder, recurrent, moderate: Secondary | ICD-10-CM | POA: Diagnosis not present

## 2023-08-08 ENCOUNTER — Ambulatory Visit: Payer: Medicare Other | Admitting: Family Medicine

## 2023-08-13 ENCOUNTER — Ambulatory Visit (HOSPITAL_BASED_OUTPATIENT_CLINIC_OR_DEPARTMENT_OTHER)
Admission: RE | Admit: 2023-08-13 | Discharge: 2023-08-13 | Disposition: A | Discharge status: Home / Self Care | Payer: Medicare Other | Source: Ambulatory Visit | Attending: Pulmonary Disease | Admitting: Pulmonary Disease

## 2023-08-13 DIAGNOSIS — D869 Sarcoidosis, unspecified: Secondary | ICD-10-CM | POA: Diagnosis not present

## 2023-08-13 DIAGNOSIS — I700 Atherosclerosis of aorta: Secondary | ICD-10-CM | POA: Diagnosis not present

## 2023-08-13 DIAGNOSIS — R911 Solitary pulmonary nodule: Secondary | ICD-10-CM | POA: Diagnosis not present

## 2023-08-14 ENCOUNTER — Encounter: Payer: Self-pay | Admitting: Nurse Practitioner

## 2023-08-14 ENCOUNTER — Ambulatory Visit (INDEPENDENT_AMBULATORY_CARE_PROVIDER_SITE_OTHER): Payer: Medicare Other | Admitting: Nurse Practitioner

## 2023-08-14 VITALS — BP 138/76 | HR 56 | Temp 98.2°F | Ht 61.0 in | Wt 164.0 lb

## 2023-08-14 DIAGNOSIS — E669 Obesity, unspecified: Secondary | ICD-10-CM | POA: Diagnosis not present

## 2023-08-14 DIAGNOSIS — Z6830 Body mass index (BMI) 30.0-30.9, adult: Secondary | ICD-10-CM | POA: Diagnosis not present

## 2023-08-14 DIAGNOSIS — F320 Major depressive disorder, single episode, mild: Secondary | ICD-10-CM | POA: Diagnosis not present

## 2023-08-14 NOTE — Progress Notes (Signed)
Office: [phone]  /  Fax: [phone]  WEIGHT SUMMARY AND BIOMETRICS  Weight Lost Since Last Visit: 0lb  Weight Gained Since Last Visit: 0lb   Vitals Temp: 98.2 F (36.8 C) BP: 138/76 Pulse Rate: (!) 56 SpO2: 97 %   Anthropometric Measurements Height: 5\' 1"  (1.549 m) Weight: 164 lb (74.4 kg) BMI (Calculated): 31 Weight at Last Visit: 164lb Weight Lost Since Last Visit: 0lb Weight Gained Since Last Visit: 0lb Starting Weight: 168lb Total Weight Loss (lbs): 4 lb (1.814 kg)   Body Composition  Body Fat %: 44.2 % Fat Mass (lbs): 72.8 lbs Muscle Mass (lbs): 87.4 lbs Total Body Water (lbs): 61.2 lbs Visceral Fat Rating : 13   Other Clinical Data Fasting: No Labs: No Today's Visit #: 24 Starting Date: 01/05/21     HPI  Chief Complaint: OBESITY  Lisa Acosta is here to discuss her progress with her obesity treatment plan. She is on the practicing portion control and making smarter food choices, such as increasing vegetables and decreasing simple carbohydrates and states she is following her eating plan approximately 75 % of the time. She states she is exercising 0 minutes 0 days per week.   Interval History:  Since last office visit she has maintained her weight.  She notes "I am so stressed and feels overwhelmed".  She has been sick since her last visit. She notes her taste has changed since having strept throat and with starting and increase Effexor dose. Because of this, she has been eating " a lot of junk food".  She has been drinking 2 protein shakes daily because of taste change. She is struggling with her right knee and is seeing Ortho next week.     Pharmacotherapy for weight loss: She is not currently taking medications  for medical weight loss.    Depression She is taking Effexor XR 75mg  2 daily-increased since her last visit. Denies side effects.  Feels that the Effexor has been beneficial.   PHYSICAL EXAM:  Blood pressure 138/76, pulse (!) 56,  temperature 98.2 F (36.8 C), height 5\' 1"  (1.549 m), weight 164 lb (74.4 kg), SpO2 97%. Body mass index is 30.99 kg/m.  General: She is overweight, cooperative, alert, well developed, and in no acute distress. PSYCH: Has normal mood, affect and thought process.   Extremities: No edema.  Neurologic: No gross sensory or motor deficits. No tremors or fasciculations noted.    DIAGNOSTIC DATA REVIEWED:  BMET    Component Value Date/Time   NA 141 06/08/2023 1544   K 4.5 06/08/2023 1544   CL 99 06/08/2023 1544   CO2 24 06/08/2023 1544   GLUCOSE 99 06/08/2023 1544   GLUCOSE 120 (H) 06/15/2022 0935   BUN 29 (H) 06/08/2023 1544   CREATININE 0.89 06/08/2023 1544   CALCIUM 10.1 06/08/2023 1544   GFRNONAA >60 06/15/2022 0935   GFRAA >60 09/24/2019 1224   Lab Results  Component Value Date   HGBA1C 5.7 01/10/2023   HGBA1C 6.0 01/22/2017   Lab Results  Component Value Date   INSULIN 20.4 01/05/2021   Lab Results  Component Value Date   TSH 1.13 01/10/2023   CBC    Component Value Date/Time   WBC 8.0 06/08/2023 1544   WBC 6.7 06/15/2022 0935   RBC 4.39 06/08/2023 1544   RBC 4.43 06/15/2022 0935   HGB 13.6 06/08/2023 1544   HCT 41.6 06/08/2023 1544   PLT 276 06/08/2023 1544   MCV 95 06/08/2023 1544   MCH 31.0  06/08/2023 1544   MCH 29.6 06/15/2022 0935   MCHC 32.7 06/08/2023 1544   MCHC 32.7 06/15/2022 0935   RDW 13.4 06/08/2023 1544   Iron Studies No results found for: "IRON", "TIBC", "FERRITIN", "IRONPCTSAT" Lipid Panel     Component Value Date/Time   CHOL 151 01/10/2023 1430   CHOL 186 01/05/2021 1040   TRIG 74.0 01/10/2023 1430   HDL 66.70 01/10/2023 1430   HDL 67 01/05/2021 1040   CHOLHDL 2 01/10/2023 1430   VLDL 14.8 01/10/2023 1430   LDLCALC 70 01/10/2023 1430   LDLCALC 95 01/05/2021 1040   LDLCALC 103 (H) 06/23/2020 1148   LDLDIRECT 112.0 01/22/2017 1559   Hepatic Function Panel     Component Value Date/Time   PROT 7.1 06/08/2023 1544   ALBUMIN 4.5  06/08/2023 1544   AST 28 06/08/2023 1544   ALT 23 06/08/2023 1544   ALKPHOS 91 06/08/2023 1544   BILITOT 0.9 06/08/2023 1544   BILIDIR 0.1 06/23/2020 1148   BILIDIR 0.24 04/30/2020 1048   IBILI 0.7 06/23/2020 1148      Component Value Date/Time   TSH 1.13 01/10/2023 1430   Nutritional Lab Results  Component Value Date   VD25OH 79.10 11/16/2021   VD25OH 25.0 (L) 01/05/2021     ASSESSMENT AND PLAN  TREATMENT PLAN FOR OBESITY:  Recommended Dietary Goals  Lisa Acosta is currently in the action stage of change. As such, her goal is to continue weight management plan. She has agreed to practicing portion control and making smarter food choices, such as increasing vegetables and decreasing simple carbohydrates.  Behavioral Intervention  We discussed the following Behavioral Modification Strategies today: increasing lean protein intake to established goals, increasing water intake , reading food labels , keeping healthy foods at home, planning for success, and continue to work on maintaining a reduced calorie state, getting the recommended amount of protein, incorporating whole foods, making healthy choices, staying well hydrated and practicing mindfulness when eating..  Additional resources provided today: NA  Recommended Physical Activity Goals  She is Unable to participate in physical activity at present due to medical conditions     ASSOCIATED CONDITIONS ADDRESSED TODAY  Action/Plan  Depression, major, single episode, mild (HCC) To follow up with PCP.    Generalized obesity  BMI 30.0-30.9,adult      Keep appt with pulmonary on 08/22/23 and ortho on 08/23/23   Return in about 6 weeks (around 09/25/2023).[name] She was informed of the importance of frequent follow up visits to maximize her success with intensive lifestyle modifications for her multiple health conditions.   ATTESTASTION STATEMENTS:  Reviewed by clinician on day of visit: allergies, medications, problem  list, medical history, surgical history, family history, social history, and previous encounter notes.   Time spent on visit including pre-visit chart review and post-visit care and charting was 30 minutes.    Theodis Sato. Altie Savard FNP-C

## 2023-08-15 ENCOUNTER — Encounter: Payer: Self-pay | Admitting: Family Medicine

## 2023-08-16 NOTE — Telephone Encounter (Signed)
Request has been sent to you electronically to sign the order.  Pt has been scheduled.

## 2023-08-16 NOTE — Telephone Encounter (Signed)
Its not ordered through Epic, its an external site we use. Is pt needing a NV?

## 2023-08-17 ENCOUNTER — Ambulatory Visit: Payer: Medicare Other

## 2023-08-17 DIAGNOSIS — G4700 Insomnia, unspecified: Secondary | ICD-10-CM | POA: Diagnosis not present

## 2023-08-17 DIAGNOSIS — F320 Major depressive disorder, single episode, mild: Secondary | ICD-10-CM

## 2023-08-17 DIAGNOSIS — F411 Generalized anxiety disorder: Secondary | ICD-10-CM | POA: Diagnosis not present

## 2023-08-17 DIAGNOSIS — F331 Major depressive disorder, recurrent, moderate: Secondary | ICD-10-CM | POA: Diagnosis not present

## 2023-08-17 NOTE — Progress Notes (Signed)
Lisa Acosta is a 72 y.o. female presents to the office today for GeneSight swab per physician's orders. Original order: 08/16/23 Pt was seen in office today. Genesight test was collected/ordered. Sample was packaged and placed up front for [employer] to pick up today between 1pm and 2pm. Consent and insurance info were in the package.   DOD: Tenna Child  [place], Feliberto Harts

## 2023-08-21 NOTE — Progress Notes (Signed)
GeneSight report received. Printed and placed on Lisa Acosta's desk for PCP.

## 2023-08-22 ENCOUNTER — Ambulatory Visit (HOSPITAL_BASED_OUTPATIENT_CLINIC_OR_DEPARTMENT_OTHER): Payer: Medicare Other | Admitting: Pulmonary Disease

## 2023-08-22 ENCOUNTER — Encounter: Payer: Self-pay | Admitting: Physician Assistant

## 2023-08-22 ENCOUNTER — Ambulatory Visit (INDEPENDENT_AMBULATORY_CARE_PROVIDER_SITE_OTHER): Payer: Medicare Other | Admitting: Physician Assistant

## 2023-08-22 ENCOUNTER — Encounter: Payer: Self-pay | Admitting: Family Medicine

## 2023-08-22 VITALS — BP 137/62 | HR 55 | Temp 98.2°F | Ht 61.0 in | Wt 168.0 lb

## 2023-08-22 DIAGNOSIS — J029 Acute pharyngitis, unspecified: Secondary | ICD-10-CM

## 2023-08-22 DIAGNOSIS — J069 Acute upper respiratory infection, unspecified: Secondary | ICD-10-CM | POA: Diagnosis not present

## 2023-08-22 LAB — POCT RAPID STREP A (OFFICE): Rapid Strep A Screen: POSITIVE — AB

## 2023-08-22 NOTE — Progress Notes (Signed)
Established patient visit   Patient: Lisa Acosta   DOB: [birth year]   72 y.o. Female  MRN: [id number] Visit Date: 08/22/2023  Today's healthcare provider: Alfredia Ferguson, PA-C   Cc. Sore throat, fatigue  Subjective     Pt reports she did feel better after the last round of antibiotics for strep pharyngitis, but for the last few days has had a sore throat, painful to swallow, fatigue, coughing.  Taking mucinex over the counter.   Medications: Outpatient Medications Prior to Visit  Medication Sig   amLODipine (NORVASC) 5 MG tablet TAKE 1 TABLET (5 MG TOTAL) BY MOUTH DAILY.   atenolol (TENORMIN) 50 MG tablet TAKE 1 TABLET BY MOUTH EVERY DAY   Calcium Carb-Cholecalciferol (CALCIUM 600 + D PO) Take 1 tablet by mouth in the morning and at bedtime.   flecainide (TAMBOCOR) 100 MG tablet Take 1 tablet (100 mg total) by mouth 2 (two) times daily.   fluticasone (FLONASE) 50 MCG/ACT nasal spray Place 2 sprays into both nostrils daily.   folic acid (FOLVITE) 1 MG tablet Take 2 tablets (2 mg total) by mouth daily.   guaiFENesin (MUCINEX) 600 MG 12 hr tablet Take 2 tablets (1,200 mg total) by mouth 2 (two) times daily.   Krill Oil 500 MG CAPS Take 500 mg by mouth daily.   levothyroxine (SYNTHROID) 125 MCG tablet Take 1 tablet (125 mcg total) by mouth daily before breakfast.   magnesium oxide (MAG-OX) 400 (240 Mg) MG tablet Take 400 mg by mouth daily.   methotrexate (RHEUMATREX) 2.5 MG tablet TAKE 8 TABLETS (20 MG TOTAL) BY MOUTH ONCE A WEEK. CAUTION:CHEMOTHERAPY. PROTECT FROM LIGHT.   Multiple Vitamins-Minerals (PRESERVISION AREDS 2+MULTI VIT PO) Take 1 capsule by mouth daily.   nystatin (MYCOSTATIN/NYSTOP) powder Apply 1 Application topically 3 (three) times daily. Use as needed for yeast infection on skin   omeprazole (PRILOSEC) 40 MG capsule Take 1 capsule (40 mg total) by mouth in the morning and at bedtime.   ondansetron (ZOFRAN) 8 MG tablet Take 1 tablet (8 mg total) by mouth  every 8 (eight) hours as needed for nausea or vomiting.   Propylene Glycol (SYSTANE BALANCE) 0.6 % SOLN Place 1 drop into both eyes as needed (dry eyes).   rivaroxaban (XARELTO) 20 MG TABS tablet Take 1 tablet (20 mg total) by mouth daily with supper.   rosuvastatin (CRESTOR) 20 MG tablet TAKE 1 TABLET BY MOUTH EVERY DAY   traZODone (DESYREL) 50 MG tablet TAKE 1.5 TABLETS BY MOUTH AT BEDTIME.   venlafaxine XR (EFFEXOR-XR) 75 MG 24 hr capsule Take 1 capsule (75 mg total) by mouth daily with breakfast.   No facility-administered medications prior to visit.    Review of Systems  Constitutional:  Positive for fatigue. Negative for fever.  HENT:  Positive for congestion, sore throat and trouble swallowing.   Respiratory:  Positive for cough. Negative for shortness of breath.   Cardiovascular:  Negative for chest pain and leg swelling.  Gastrointestinal:  Negative for abdominal pain.  Neurological:  Negative for dizziness and headaches.       Objective    BP 137/62   Pulse (!) 55   Temp 98.2 F (36.8 C) (Oral)   Ht 5\' 1"  (1.549 m)   Wt 168 lb (76.2 kg)   SpO2 98%   BMI 31.74 kg/m    Physical Exam Constitutional:      General: She is awake.     Appearance: She is well-developed.  HENT:     Head: Normocephalic.     Right Ear: Tympanic membrane normal.     Left Ear: Tympanic membrane normal.     Mouth/Throat:     Pharynx: Posterior oropharyngeal erythema present. No oropharyngeal exudate.  Eyes:     Conjunctiva/sclera: Conjunctivae normal.  Cardiovascular:     Rate and Rhythm: Normal rate and regular rhythm.     Heart sounds: Normal heart sounds.  Pulmonary:     Effort: Pulmonary effort is normal.     Breath sounds: Normal breath sounds. No wheezing, rhonchi or rales.  Skin:    General: Skin is warm.  Neurological:     Mental Status: She is alert and oriented to person, place, and time.  Psychiatric:        Attention and Perception: Attention normal.        Mood and  Affect: Mood normal.        Speech: Speech normal.        Behavior: Behavior is cooperative.      Results for orders placed or performed in visit on 08/22/23  POCT rapid strep A  Result Value Ref Range   Rapid Strep A Screen Positive (A) Negative    Assessment & Plan    Sore throat -     POCT rapid strep A -     Culture, Group A Strep  Upper respiratory tract infection, unspecified type   Poc strep is very faintly positive.  Recommending throat culture before another round of antibiotics, given clinically there is only erythema of the throat w/o exudate, she also has a cough and other systemic symptoms.  Advised fluids, mucinex, tylenol, antihistamines, flonase.  Will treat as indicated based on throat culture  Return if symptoms worsen or fail to improve.       Alfredia Ferguson, PA-C  [facility] at [facility] [phone] (phone) [phone] (fax)  [facility]

## 2023-08-23 ENCOUNTER — Ambulatory Visit (HOSPITAL_BASED_OUTPATIENT_CLINIC_OR_DEPARTMENT_OTHER): Payer: Medicare Other | Admitting: Orthopaedic Surgery

## 2023-08-24 ENCOUNTER — Other Ambulatory Visit (HOSPITAL_BASED_OUTPATIENT_CLINIC_OR_DEPARTMENT_OTHER): Payer: Self-pay | Admitting: Pulmonary Disease

## 2023-08-24 ENCOUNTER — Telehealth: Payer: Self-pay | Admitting: Family Medicine

## 2023-08-24 LAB — CULTURE, GROUP A STREP
Micro Number: 15837431
SPECIMEN QUALITY:: ADEQUATE

## 2023-08-24 NOTE — Telephone Encounter (Signed)
Will not send refill right now, will wait until pt discuss with provider.

## 2023-08-24 NOTE — Telephone Encounter (Signed)
Prescription Request  08/24/2023  Is this a "Controlled Substance" medicine? Yes  LOV: 05/09/2023  What is the name of the medication or equipment? venlafaxine XR (EFFEXOR-XR) 75 MG 24 hr capsule  Have you contacted your pharmacy to request a refill? No   Which pharmacy would you like this sent to?  [id number] - [place], [place] - [address] [place] [address] Phone: [phone] Fax: [phone]   Patient notified that their request is being sent to the clinical staff for review and that they should receive a response within 2 business days.   Please advise at [facility] [phone]

## 2023-08-24 NOTE — Telephone Encounter (Signed)
Called pt to discuss what has been going on with her She was taking effexor xr 75 mg- we went up to 150 about 5 weeks ago Shortly after the increase she noted food did not taste good, she felt more jittery, coughing more and vision change- more difficulty seeing signs, reading, etc.  No pain in her eyes  She cut back down to 75 mg about 2 weeks ago She does not feel like her vision has gotten better but her other sx have gotten better Last eye exam was about 6 months ago-all looked okay It does not sound she is having angle-closure glaucoma, no pain or dramatic vision change.  We went over her genesight results- we decided to go with zoloft when she does go back on psychotripc meds as she tolerated in the past with no significant side effects  She is also taking 50 of trazodone but no other serotonergic drugs  We discussed the possibility of serotonin syndrome.  However, her symptoms are more chronic as opposed to acute, I do not think this is acute serotonin syndrome.  We will have her stop everything but 50 mg of trazodone and I will touch base with her tomorrow  She will go to the ER if anything gets worse in the meantime

## 2023-08-28 DIAGNOSIS — F331 Major depressive disorder, recurrent, moderate: Secondary | ICD-10-CM | POA: Diagnosis not present

## 2023-08-30 ENCOUNTER — Telehealth: Payer: Self-pay | Admitting: Family Medicine

## 2023-08-30 DIAGNOSIS — F320 Major depressive disorder, single episode, mild: Secondary | ICD-10-CM

## 2023-08-30 MED ORDER — SERTRALINE HCL 50 MG PO TABS: 50.0000 mg | ORAL_TABLET | Freq: Every day | ORAL | 3 refills | Status: DC

## 2023-08-30 NOTE — Telephone Encounter (Signed)
Called patient, she notes the unusual side effect she noted while taking Effexor seem to have resolved.  At this point she would like to get back on sertraline which she has taken previously I sent a prescription for sertraline 50 to her pharmacy-she will let me know how this works for her JC

## 2023-09-03 ENCOUNTER — Ambulatory Visit (HOSPITAL_BASED_OUTPATIENT_CLINIC_OR_DEPARTMENT_OTHER): Payer: Medicare Other | Admitting: Pulmonary Disease

## 2023-09-03 ENCOUNTER — Encounter (HOSPITAL_BASED_OUTPATIENT_CLINIC_OR_DEPARTMENT_OTHER): Payer: Self-pay | Admitting: Pulmonary Disease

## 2023-09-03 ENCOUNTER — Other Ambulatory Visit: Payer: Self-pay | Admitting: Pulmonary Disease

## 2023-09-03 VITALS — BP 122/62 | HR 64 | Resp 14 | Ht 61.0 in | Wt 169.9 lb

## 2023-09-03 DIAGNOSIS — R053 Chronic cough: Secondary | ICD-10-CM | POA: Diagnosis not present

## 2023-09-03 DIAGNOSIS — Z79899 Other long term (current) drug therapy: Secondary | ICD-10-CM

## 2023-09-03 DIAGNOSIS — D869 Sarcoidosis, unspecified: Secondary | ICD-10-CM | POA: Diagnosis not present

## 2023-09-03 MED ORDER — METHOTREXATE SODIUM 2.5 MG PO TABS: 20.0000 mg | ORAL_TABLET | ORAL | 2 refills | Status: DC

## 2023-09-03 MED ORDER — FOLIC ACID 1 MG PO TABS
2.0000 mg | ORAL_TABLET | Freq: Every day | ORAL | 3 refills | Status: DC
Start: 2023-09-03 — End: 2024-05-26

## 2023-09-03 NOTE — Progress Notes (Signed)
Subjective:   PATIENT ID: Lisa Acosta GENDER: female DOB: 08-31-1951, MRN: [id number]  Chief Complaint  Patient presents with   Follow-up    Doing ok    Reason for Visit: Follow-up  [name] is a 72 year old female never smoker with pulmonary sarcoid GERD, HTN, HLD, atrial fibrillation who presents for follow-up.  Synopsis She was previously seen by Dr. Tonia Brooms for pulmonary sarcoid and started on steroids in October 2023 however discontinued in January due to 20 lb weight gain and headaches, irritiability and darker stool. Cough had initially started six months ago. She reports significant fatigue and prior sleep studies have been negative. She is wearing oxygen nightly. Started methotrexate in Feb 2024  2024 - Started on methotrexate in Feb with improved respiratory symptoms. Improved but persistent cough and upper airway mucous production.  09/03/23 Since our last visit she has had side effects related to antidepressants. She reports cough unchanged. Some sputum in her mouth and not necessarily from her cough. Has a sore throat. Compliant with nocturnal oxygen. Daughter present and provides additional history.  Social History: Never smoker. 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   Pre-diabetes      Family History  Problem Relation Age of Onset   Atrial fibrillation Mother    Congestive Heart Failure  Mother    Depression Mother    Heart disease Mother    Stroke Mother    Thyroid disease Mother    Anxiety disorder Mother    Obesity Mother    Heart disease Father    Alcohol abuse Father    High blood pressure Father    High Cholesterol Father    Alcoholism Father    Depression Sister    Atrial fibrillation Sister    Heart attack Sister    Heart disease Brother    Heart disease Brother    Depression Daughter      Social History   Occupational History   Occupation: Retired    Associate Professor: OTHER    Comment: Administrator, [employer]  Tobacco Use   Smoking status: Never    Passive exposure: Current (doesn't smoke in house now or past 61yrs)   Smokeless tobacco: Never  Vaping Use   Vaping status: Never Used  Substance and Sexual Activity   Alcohol use: Yes    Comment: rare   Drug use: No   Sexual activity: Not on file    Allergies  Allergen Reactions   Dextran Anaphylaxis   Tree Extract    Penicillin G Rash   Penicillins Rash   Sulfa Antibiotics Rash and Other (See Comments)   Wound Dressing Adhesive Rash     Outpatient Medications Prior to Visit  Medication Sig Dispense Refill   amLODipine (NORVASC) 5 MG tablet TAKE 1 TABLET (5 MG  TOTAL) BY MOUTH DAILY. 90 tablet 0   atenolol (TENORMIN) 50 MG tablet TAKE 1 TABLET BY MOUTH EVERY DAY 90 tablet 1   Calcium Carb-Cholecalciferol (CALCIUM 600 + D PO) Take 1 tablet by mouth in the morning and at bedtime.     flecainide (TAMBOCOR) 100 MG tablet Take 1 tablet (100 mg total) by mouth 2 (two) times daily. 180 tablet 3   fluticasone (FLONASE) 50 MCG/ACT nasal spray Place 2 sprays into both nostrils daily. 48 mL 1   folic acid (FOLVITE) 1 MG tablet Take 2 tablets (2 mg total) by mouth daily. 180 tablet 3   guaiFENesin (MUCINEX) 600 MG 12 hr tablet Take 2 tablets (1,200 mg total) by mouth 2 (two) times daily. 30 tablet 0   Krill Oil 500 MG CAPS Take 500 mg by mouth daily.     levothyroxine (SYNTHROID) 125 MCG tablet Take 1 tablet  (125 mcg total) by mouth daily before breakfast. 90 tablet 1   magnesium oxide (MAG-OX) 400 (240 Mg) MG tablet Take 400 mg by mouth daily.     methotrexate (RHEUMATREX) 2.5 MG tablet TAKE 8 TABLETS (20 MG TOTAL) BY MOUTH ONCE A WEEK. CAUTION:CHEMOTHERAPY. PROTECT FROM LIGHT. 96 tablet 0   Multiple Vitamins-Minerals (PRESERVISION AREDS 2+MULTI VIT PO) Take 1 capsule by mouth daily.     omeprazole (PRILOSEC) 40 MG capsule Take 1 capsule (40 mg total) by mouth in the morning and at bedtime. 180 capsule 1   ondansetron (ZOFRAN) 8 MG tablet Take 1 tablet (8 mg total) by mouth every 8 (eight) hours as needed for nausea or vomiting. 30 tablet 2   Propylene Glycol (SYSTANE BALANCE) 0.6 % SOLN Place 1 drop into both eyes as needed (dry eyes).     rivaroxaban (XARELTO) 20 MG TABS tablet Take 1 tablet (20 mg total) by mouth daily with supper. 14 tablet 0   rosuvastatin (CRESTOR) 20 MG tablet TAKE 1 TABLET BY MOUTH EVERY DAY 90 tablet 3   sertraline (ZOLOFT) 50 MG tablet Take 1 tablet (50 mg total) by mouth daily. 90 tablet 3   traZODone (DESYREL) 50 MG tablet TAKE 1.5 TABLETS BY MOUTH AT BEDTIME. 135 tablet 3   nystatin (MYCOSTATIN/NYSTOP) powder Apply 1 Application topically 3 (three) times daily. Use as needed for yeast infection on skin 30 g 2   No facility-administered medications prior to visit.    Review of Systems  Constitutional:  Negative for chills, diaphoresis, fever, malaise/fatigue and weight loss.  HENT:  Negative for congestion.   Respiratory:  Positive for cough and sputum production. Negative for hemoptysis, shortness of breath and wheezing.   Cardiovascular:  Negative for chest pain, palpitations and leg swelling.     Objective:   Vitals:   09/03/23 1421  BP: 122/62  Pulse: 64  Resp: 14  SpO2: 92%  Weight: 169 lb 14.4 oz (77.1 kg)  Height: 5\' 1"  (1.549 m)   SpO2: 92 %  Physical Exam: General: Well-appearing, no acute distress HENT: [place], AT Eyes: EOMI, no scleral  icterus Respiratory: Clear to auscultation bilaterally.  No crackles, wheezing or rales Cardiovascular: RRR, -M/R/G, no JVD Extremities:-Edema,-tenderness Neuro: AAO x4, CNII-XII grossly intact Psych: Normal mood, normal affect   Data Reviewed:  Imaging: PET 06/07/22 - Hypermetabolic masslike consolidation in LUL, hypermetabolic left supraclavicular, hilar and mediastinal adenopathy. RUL nodule 12 mm new CT Super D 06/26/22 - LUL lung mass with GGO. RUL nodule resolved CT Chest 08/14/22 - Stable LUL focal opacity with surrounding GGO, new  reticular opacities in lung bases, stable left supraclavicular, left hilar and prevascular lymph nodes CT Chest 02/10/23 - Slightly decreased LUL with surrounding GGO. Unchanged lower lobe reticulations. Stable mediastinal and hilar lymph nodes. Stable 6mm RML nodule CT Chest 08/13/23 - Stable LUL nodules, small mediastinal lymph nodes  PFT: 08/29/16  FVC 2.63 (95%) FEV1 2.21 (105%) Ratio 84  TLC 91% DLCO 75% Interpretation: Normal spirometry and lung volumes. Mildly reduced DLCO.  10/05/22 FVC 2.37 (92%) FEV1 2.02 (104%) Ratio 85  TLC 87% DLCO 65% Interpretation: Normal spirometry and lung volumes. Compared to prior reduced DLCO, remains mild   Labs:    Latest Ref Rng & Units 06/08/2023    3:44 PM 03/08/2023   10:12 AM 01/05/2023   11:04 AM  CBC  WBC 3.4 - 10.8 x10E3/uL 8.0  6.9  7.5   Hemoglobin 11.1 - 15.9 g/dL 16.1  09.6  04.5   Hematocrit 34.0 - 46.6 % 41.6  40.0  41.4   Platelets 150 - 450 x10E3/uL 276         Latest Ref Rng & Units 06/08/2023    3:44 PM 03/08/2023   10:12 AM 01/05/2023   11:04 AM  CMP  Glucose 70 - 99 mg/dL 99  94  409   BUN 8 - 27 mg/dL 29  19  23    Creatinine 0.57 - 1.00 mg/dL 8.11  9.14  7.82   Sodium 134 - 144 mmol/L 141  142  141   Potassium 3.5 - 5.2 mmol/L 4.5  4.4  4.8   Chloride 96 - 106 mmol/L 99  104  104   CO2 20 - 29 mmol/L 24  25  22    Calcium 8.7 - 10.3 mg/dL 95.6  9.6  9.8   Total Protein 6.0 - 8.5  g/dL 7.1  6.8  6.8   Total Bilirubin 0.0 - 1.2 mg/dL 0.9  0.7  0.8   Alkaline Phos 44 - 121 IU/L 91  89  91   AST 0 - 40 IU/L 28  31  42   ALT 0 - 32 IU/L 23  32  35    Pathology: 06/29/22 LUL Needle, endobronchial biopsy- Numerous noncaseating granulomas  Sleep: PSG 03/30/22 - No sleep apnea. Nocturnal hypoxemia  Overnight Oximetry Results Date: 06/26/23 SpO2 <88% 1 hour 34 min 37 sec.  [place] SpO2 74%  Qualified for oxygen     Assessment & Plan:   Discussion: 72 year old female never smoker with pulmonary sarcoid GERD, HTN, HLD, atrial fibrillation who presents for follow-up  We discussed the clinical course of sarcoid and management including serial PFTs, labs, eye exam, and EKG and chest imaging if indicated. Addressed questions and concerns. Tolerating methotrexate for active sarcoid flare. She does complain of ongoing cough excessive saliva production. Unclear etiology.  CT Chest 02/10/23 with improved LUL consolidation consistent with sarcoid improvement on methotrexate. Unchanged/slightly improved reticulation in the lower portion of the lungs. Cough has returned on reduced methotrexate dose.  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  Discontinued d/t agitation, HA. No taper --Methotrexate started Feb 11/04/22 --Remains off steroids --CONTINUE methotrexate to 8 tablets (20  mg) weekly. Generic preferred. REFILLED --Continue folic acid 2 mg daily. REFILLED --ORDER labs today: CBC with diff, CMP today and q3 months.   Sarcoid Monitoring --Recent chest imaging reviewed. Plan for repeat imaging after on stable methotrexate dosing --Annual PFTs.  Last PFTs 09/2022. Mildly reduced DLCO --Annual ophthalmology exam. Last exam 12/26/22 --Followed by EP at [facility]. --Routine labs as needed: CBC with diff, CMET, 1, 25 and 25 hydroxy vitamin D, urinary calcium  Nocturnal hypoxemia Hx OSA --CONTINUE 2L O2 via [place] nightly  Health Maintenance Immunization History  Administered Date(s) Administered   Fluad Quad(high Dose 65+) 05/02/2019, 06/23/2020, 06/30/2021, 05/29/2022   Influenza, High Dose Seasonal PF 06/14/2016, 05/15/2018   Influenza,inj,Quad PF,6+ Mos 06/11/2014   Influenza,inj,quad, With Preservative 06/11/2017   Influenza-Unspecified 06/12/2023   PFIZER(Purple Top)SARS-COV-2 Vaccination 10/03/2019, 10/24/2019, 06/17/2020, 03/16/2021   Pfizer Covid-19 Vaccine Bivalent Booster 13yrs & up 08/02/2021   Pneumococcal Conjugate-13 08/07/2016   Pneumococcal Polysaccharide-23 01/28/2018   Tdap 01/28/2018   CT Lung Screen - not qualified. Serial imaging as noted above  Orders Placed This Encounter  Procedures   CBC with Differential    Standing Status:   Future    Expiration Date:   09/02/2024   Comp Met (CMET)    Standing Status:   Future    Expiration Date:   09/02/2024   Ambulatory referral to ENT    Referral Priority:   Routine    Referral Type:   Consultation    Referral Reason:   Specialty Services Required    Requested Specialty:   Otolaryngology    Number of Visits Requested:   1   Meds ordered this encounter  Medications   methotrexate (RHEUMATREX) 2.5 MG tablet    Sig: Take 8 tablets (20 mg total) by mouth once a week. Caution:Chemotherapy. Protect from light.    Dispense:  96 tablet    Refill:  2   folic acid (FOLVITE) 1 MG tablet    Sig: Take  2 tablets (2 mg total) by mouth daily.    Dispense:  180 tablet    Refill:  3    Return in about 2 months (around 11/04/2023).  I have spent a total time of 40-minutes on the day of the appointment including chart review, data review, collecting history, coordinating care and discussing medical diagnosis and plan with the patient/family. Past medical history, allergies, medications were reviewed. Pertinent imaging, labs and tests included in this note have been reviewed and interpreted independently by me.  Jennilee Demarco Mechele Collin, MD [facility] 09/03/2023 2:49 PM  Office Number [phone]

## 2023-09-03 NOTE — Patient Instructions (Addendum)
--  CT 08/13/23 Stable LUL consolidation but improved lymph nodes --Discuss CT at next visit and consider reducing methotrexate to 15 mg at that time --Mucinex as directed for sputum production --REFER ENT for chronic cough  --CONTINUE methotrexate to 8 tablets (20 mg) weekly. Generic preferred. REFILLED --Continue folic acid 2 mg daily. REFILLED --ORDER labs today: CBC with diff, CMP today and q3 months.

## 2023-09-04 LAB — COMPREHENSIVE METABOLIC PANEL
ALT: 28 [IU]/L (ref 0–32)
AST: 36 [IU]/L (ref 0–40)
Albumin: 4.2 g/dL (ref 3.8–4.8)
Alkaline Phosphatase: 104 [IU]/L (ref 44–121)
BUN/Creatinine Ratio: 30 — ABNORMAL HIGH (ref 12–28)
BUN: 21 mg/dL (ref 8–27)
Bilirubin Total: 0.8 mg/dL (ref 0.0–1.2)
CO2: 26 mmol/L (ref 20–29)
Calcium: 9.8 mg/dL (ref 8.7–10.3)
Chloride: 103 mmol/L (ref 96–106)
Creatinine, Ser: 0.69 mg/dL (ref 0.57–1.00)
Globulin, Total: 2.8 g/dL (ref 1.5–4.5)
Glucose: 98 mg/dL (ref 70–99)
Potassium: 4 mmol/L (ref 3.5–5.2)
Sodium: 143 mmol/L (ref 134–144)
Total Protein: 7 g/dL (ref 6.0–8.5)
eGFR: 92 mL/min/{1.73_m2} (ref >59)

## 2023-09-04 LAB — CBC WITH DIFFERENTIAL/PLATELET
Basophils Absolute: 0.1 10*3/uL (ref 0.0–0.2)
Basos: 1 %
EOS (ABSOLUTE): 0.2 10*3/uL (ref 0.0–0.4)
Eos: 3 %
Hematocrit: 39.9 % (ref 34.0–46.6)
Hemoglobin: 12.9 g/dL (ref 11.1–15.9)
Immature Grans (Abs): 0 10*3/uL (ref 0.0–0.1)
Immature Granulocytes: 0 %
Lymphocytes Absolute: 1.7 10*3/uL (ref 0.7–3.1)
Lymphs: 26 %
MCH: 29.5 pg (ref 26.6–33.0)
MCHC: 32.3 g/dL (ref 31.5–35.7)
MCV: 91 fL (ref 79–97)
Monocytes Absolute: 0.9 10*3/uL (ref 0.1–0.9)
Monocytes: 13 %
Neutrophils Absolute: 3.8 10*3/uL (ref 1.4–7.0)
Neutrophils: 57 %
Platelets: 253 10*3/uL (ref 150–450)
RBC: 4.37 x10E6/uL (ref 3.77–5.28)
RDW: 12.4 % (ref 11.7–15.4)
WBC: 6.7 10*3/uL (ref 3.4–10.8)

## 2023-09-06 ENCOUNTER — Other Ambulatory Visit: Payer: Self-pay | Admitting: Family Medicine

## 2023-09-06 DIAGNOSIS — I480 Paroxysmal atrial fibrillation: Secondary | ICD-10-CM

## 2023-09-08 ENCOUNTER — Encounter (HOSPITAL_BASED_OUTPATIENT_CLINIC_OR_DEPARTMENT_OTHER): Payer: Self-pay | Admitting: Pulmonary Disease

## 2023-09-09 NOTE — Progress Notes (Unsigned)
[facility] [address], [address] [place], [place] [address] [phone]  Date:  09/10/2023   Name:  Lisa Acosta   DOB:  [birth year]   MRN:  [id number]  PCP:  Pearline Cables, MD    Chief Complaint: Follow-up (Pt had a Genesight done on 08/22/23. Now on Zoloft 50 mg./Concerns/ questions: pt says she has had some congestion and cough Pulm has referred her to ENT. )   History of Present Illness:  Lisa Acosta is a 72 y.o. very pleasant female patient who presents with the following:  Patient seen today for follow-up-I saw her most recently in August although we have been communicating via MyChart since then. History of GERD, hypertension, hyperlipidemia, atrial fibrillation, anxiety and depression and recently diagnosed pulmonary sarcoidosis   We have had difficulty finding an effective antidepressant for her, we recently completed GeneSight testing-unfortunately she has significant interaction with a lot of medications: According to this test, the best antidepressant medications for you are likely Pristiq or Viibryd. You also could use Fetzima or Emsam- however I am not very familiar with Fetzima, and Emsam is a medication type with a LOT of serious possible interaction with other meds so I think Pristiq or Viibryd would be the way to go You have a few drugs that fall in the "moderate interaction" list as well-Zoloft, Celexa, Lexapro, Wellbutrin, and interestingly trazodone which you are also taking  She was most recently using Effexor and developed significant symptoms, we had her stop Effexor -symptoms resolved, I restarted sertraline which she tolerated in the past on December 19 She feels like the sertraline is working pretty well The trazodone seems to be causing some "brain fog" and unsteadiness in the am- which may last about 30 minutes Insomnia has been a chronic issue for her She is taking 75 mg of trazodone  at 7 or 8pm- this does generally help but still not great She notes she will sleep really deeply and trends to have strange dreams Insomnia has been a problem for years-she would be willing to try different medication.  Trazodone is on the "moderate interaction" list on her GeneSight test  She met with her counselor on December 17 Seen by pulmonology, Dr. Everardo All on December 23 regarding her pulmonary sarcoidosis: Pulmonary sarcoidosis - active flare on immunosuppressants with improved mediastinal adenopathy and respiratory symptoms Chronic cough - persistent --Dx in 05/29/22 and 06/15/22 via endobronchial bx --PET/CT 06/07/22 Hypermetabolic masslike consolidation with mediastinal/hilar adenopathy --CT Chest 02/10/23 Improved LUL consolidation --CT 08/13/23 Stable LUL consolidation but improved lymph nodes --Discuss CT at next visit and consider reducing methotrexate to 15 mg at that time --Mucinex as directed for sputum production --REFER ENT for chronic cough History of immunosuppression High risk medication management --Prednisone 30 mg 06/2022-09/2022 . Discontinued d/t agitation, HA. No taper --Methotrexate started Feb 11/04/22 --Remains off steroids --CONTINUE methotrexate to 8 tablets (20 mg) weekly. Generic preferred. REFILLED --Continue folic acid 2 mg daily. REFILLED --ORDER labs today: CBC with diff, CMP today and q3 months.  Sarcoid Monitoring --Recent chest imaging reviewed. Plan for repeat imaging after on stable methotrexate dosing --Annual PFTs.  Last PFTs 09/2022. Mildly reduced DLCO --Annual ophthalmology exam. Last exam 12/26/22 --Followed by EP at [facility]. --Routine labs as needed: CBC with diff, CMET, 1, 25 and 25 hydroxy vitamin D, urinary calcium Nocturnal hypoxemia Hx OSA --CONTINUE 2L O2 via [place] nightly   Wt Readings from Last 3 Encounters:  09/10/23 170 lb (77.1 kg)  09/03/23 169 lb 14.4 oz (77.1 kg)  08/22/23 168 lb (76.2 kg)   She has been coughing for about a  week- she otherwise feels pretty good but would like a covid test just in case as they will be traveling soon and she is spending a lot of time around family  She notes her vision still seems somewhat blurry and her eyes are very dry.  I encouraged her to see her eye doctor and she plans to do so    Patient Active Problem List   Diagnosis Date Noted   Sarcoidosis 03/08/2023   High risk medication use 03/08/2023   Nocturnal hypoxemia 03/08/2023   Muscle pain 01/09/2023   Generalized obesity 10/12/2022   Class 2 severe obesity with serious comorbidity and body mass index (BMI) of 35.0 to 35.9 in adult 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   Moderate episode of recurrent major depressive disorder (HCC) 09/26/2018   Generalized anxiety disorder 09/26/2018   Osteoporosis 01/25/2018   Fatty liver 08/22/2017   Gallbladder sludge 08/22/2017   Upper abdominal pain 07/19/2017   Gastroesophageal reflux disease with esophagitis 06/01/2017   Encounter for monitoring flecainide therapy 04/12/2017   Osteoarthritis of hands, 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   Dyspnea 09/22/2016   OSA (obstructive sleep apnea) 08/31/2016   PAF (paroxysmal atrial fibrillation) (HCC) 03/16/2016   Hypothyroid 03/16/2016   Obesity (BMI 30-39.9) 03/16/2016   Skin lesion of left upper extremity 02/05/2016   Essential hypertension 11/10/2015   Hyperlipidemia 11/10/2015   Acute gastritis 06/05/2014   Allergic rhinitis 06/05/2014   Arthritis 06/05/2014   Diaphragmatic hernia 06/05/2014   Difficulty swallowing  06/05/2014   Diverticulosis of colon 06/05/2014   Hyperglycemia 06/05/2014   Impaired fasting glucose 06/05/2014   Insomnia 06/05/2014   Lower back pain 06/05/2014   Osteoarthritis of shoulder 06/05/2014   Pain in joint, pelvic region and thigh 06/05/2014   Pain of right thumb 06/05/2014   Esophageal reflux 03/18/2014   Esophageal ulcer 03/18/2014   Anxiety state 11/25/2013    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   Pre-diabetes     Past Surgical History:  Procedure Laterality Date   BREAST BIOPSY Left    needle core biopsy, benign   BRONCHIAL BIOPSY  06/15/2022   Procedure: BRONCHIAL BIOPSIES;  Surgeon: Charlott Holler, MD;  Location: [facility] ENDOSCOPY;  Service: Pulmonary;;   BRONCHIAL BIOPSY  06/29/2022   Procedure: BRONCHIAL  BIOPSIES;  Surgeon: Josephine Igo, DO;  Location: [facility] ENDOSCOPY;  Service: Pulmonary;;   BRONCHIAL NEEDLE ASPIRATION BIOPSY  06/15/2022   Procedure: BRONCHIAL NEEDLE ASPIRATION BIOPSIES;  Surgeon: Charlott Holler, MD;  Location: [facility] ENDOSCOPY;  Service: Pulmonary;;   BRONCHIAL NEEDLE ASPIRATION BIOPSY  06/29/2022   Procedure: BRONCHIAL NEEDLE ASPIRATION BIOPSIES;  Surgeon: Josephine Igo, DO;  Location: [facility] ENDOSCOPY;  Service: Pulmonary;;   BRONCHIAL WASHINGS  06/15/2022   Procedure: BRONCHIAL WASHINGS;  Surgeon: Charlott Holler, MD;  Location: [facility] ENDOSCOPY;  Service: Pulmonary;;   CATARACT EXTRACTION     DILATION AND  CURETTAGE OF UTERUS     EYE SURGERY     HAND SURGERY     KNEE SURGERY     ROTATOR CUFF REPAIR     TOTAL ABDOMINAL HYSTERECTOMY     VIDEO BRONCHOSCOPY N/A 06/15/2022   Procedure: VIDEO BRONCHOSCOPY WITH FLUORO;  Surgeon: Charlott Holler, MD;  Location: [facility] ENDOSCOPY;  Service: Pulmonary;  Laterality: N/A;   VIDEO BRONCHOSCOPY WITH ENDOBRONCHIAL ULTRASOUND  06/15/2022   Procedure: VIDEO BRONCHOSCOPY WITH ENDOBRONCHIAL ULTRASOUND;  Surgeon: Charlott Holler, MD;  Location: [facility] ENDOSCOPY;  Service: Pulmonary;;   VIDEO BRONCHOSCOPY WITH ENDOBRONCHIAL ULTRASOUND N/A 06/29/2022   Procedure: VIDEO BRONCHOSCOPY WITH ENDOBRONCHIAL ULTRASOUND;  Surgeon: Josephine Igo, DO;  Location: [facility] ENDOSCOPY;  Service: Pulmonary;  Laterality: N/A;    Social History   Tobacco Use   Smoking status: Never    Passive exposure: Current (doesn't smoke in house now or past 1yrs)   Smokeless tobacco: Never  Vaping Use   Vaping status: Never Used  Substance Use Topics   Alcohol use: Yes    Comment: rare   Drug use: No    Family History  Problem Relation Age of Onset   Atrial fibrillation Mother    Congestive Heart Failure Mother    Depression Mother    Heart disease Mother    Stroke Mother    Thyroid disease Mother    Anxiety disorder Mother    Obesity Mother    Heart disease Father    Alcohol abuse Father    High blood pressure Father    High Cholesterol Father    Alcoholism Father    Depression Sister    Atrial fibrillation Sister    Heart attack Sister    Heart disease Brother    Heart disease Brother    Depression Daughter     Allergies  Allergen Reactions   Dextran Anaphylaxis   Tree Extract    Penicillin G Rash   Penicillins Rash   Sulfa Antibiotics Rash and Other (See Comments)   Wound Dressing Adhesive Rash    Medication list has been reviewed and updated.  Current Outpatient Medications on File Prior to Visit  Medication Sig Dispense Refill   amLODipine (NORVASC) 5 MG tablet  TAKE 1 TABLET (5 MG TOTAL) BY MOUTH DAILY. 90 tablet 0   atenolol (TENORMIN) 50 MG tablet TAKE 1 TABLET BY MOUTH EVERY DAY 90 tablet 1   Calcium Carb-Cholecalciferol (CALCIUM 600 + D PO) Take 1 tablet by mouth in the morning and at bedtime.     flecainide (TAMBOCOR) 100 MG tablet TAKE 1 TABLET BY MOUTH TWICE A DAY 180 tablet 0   fluticasone (FLONASE) 50 MCG/ACT nasal spray Place 2 sprays into both nostrils daily. 48 mL 1   folic acid (FOLVITE) 1 MG tablet Take 2 tablets (2 mg total) by mouth daily. 180 tablet 3   guaiFENesin (MUCINEX) 600  MG 12 hr tablet Take 2 tablets (1,200 mg total) by mouth 2 (two) times daily. 30 tablet 0   Krill Oil 500 MG CAPS Take 500 mg by mouth daily.     levothyroxine (SYNTHROID) 125 MCG tablet Take 1 tablet (125 mcg total) by mouth daily before breakfast. 90 tablet 1   magnesium oxide (MAG-OX) 400 (240 Mg) MG tablet Take 400 mg by mouth daily.     methotrexate (RHEUMATREX) 2.5 MG tablet Take 8 tablets (20 mg total) by mouth once a week. Caution:Chemotherapy. Protect from light. 96 tablet 2   Multiple Vitamins-Minerals (PRESERVISION AREDS 2+MULTI VIT PO) Take 1 capsule by mouth daily.     omeprazole (PRILOSEC) 40 MG capsule Take 1 capsule (40 mg total) by mouth in the morning and at bedtime. 180 capsule 1   ondansetron (ZOFRAN) 8 MG tablet Take 1 tablet (8 mg total) by mouth every 8 (eight) hours as needed for nausea or vomiting. 30 tablet 2   Propylene Glycol (SYSTANE BALANCE) 0.6 % SOLN Place 1 drop into both eyes as needed (dry eyes).     rivaroxaban (XARELTO) 20 MG TABS tablet Take 1 tablet (20 mg total) by mouth daily with supper. 14 tablet 0   rosuvastatin (CRESTOR) 20 MG tablet TAKE 1 TABLET BY MOUTH EVERY DAY 90 tablet 3   sertraline (ZOLOFT) 50 MG tablet Take 1 tablet (50 mg total) by mouth daily. 90 tablet 3   traZODone (DESYREL) 50 MG tablet TAKE 1.5 TABLETS BY MOUTH AT BEDTIME. 135 tablet 3   No current facility-administered medications on file prior to  visit.    Review of Systems:  As per HPI- otherwise negative.   Physical Examination: Vitals:   09/10/23 1410  BP: 118/80  Pulse: (!) 56  Resp: 18  Temp: 98.6 F (37 C)  SpO2: 96%   Vitals:   09/10/23 1410  Weight: 170 lb (77.1 kg)  Height: 5\' 1"  (1.549 m)   Body mass index is 32.12 kg/m. Ideal Body Weight: Weight in (lb) to have BMI = 25: 132  GEN: no acute distress.  Obese, looks well HEENT: Atraumatic, Normocephalic.  Normal pupils, equal and reactive Ears and Nose: No external deformity. CV: RRR, No M/G/R. No JVD. No thrill. No extra heart sounds. PULM: CTA B, no wheezes, crackles, rhonchi. No retractions. No resp. distress. No accessory muscle use. ABD: S, NT, ND EXTR: No c/c/e PSYCH: Normally interactive. Conversant.  Normal deep tendon reflex in extremities  Assessment and Plan: Sarcoidosis  Primary insomnia - Plan: temazepam (RESTORIL) 7.5 MG capsule  Cough, unspecified type - Plan: POC COVID-19 BinaxNow  Depression, major, single episode, mild (HCC)  Patient seen today for follow-up. Earlier this month she was having some concerning side effects from Effexor.  We stopped this and had her go back on sertraline which she seems to be tolerating well.  However, she continues to have some difficulty with sleep despite taking trazodone.  Trazodone is on the moderate interaction list of her GeneSight test.  We discussed risks and benefits of trying a benzodiazepine and she would like to do a trial.  Will have her taper off trazodone and try temazepam instead-she will let me know this works for her When she is off trazodone we also may be able to go up on her sertraline She will keep me posted about her progress Signed Abbe Amsterdam, MD

## 2023-09-09 NOTE — Patient Instructions (Incomplete)
It was good to see you again today  Recommend COVID booster, shingles vaccine series if not done already  Let's have you taper off trazodone and and try temazepam for sleep Cut down to  25 mg for 3-4 days, then stop and try the temazepam  Please give your eye doctor a call and set up an appt to see them asap   Your covid test is negative- please let me know if your cough is not improving soon

## 2023-09-10 ENCOUNTER — Ambulatory Visit (INDEPENDENT_AMBULATORY_CARE_PROVIDER_SITE_OTHER): Payer: Medicare Other | Admitting: Family Medicine

## 2023-09-10 VITALS — BP 118/80 | HR 56 | Temp 98.6°F | Resp 18 | Ht 61.0 in | Wt 170.0 lb

## 2023-09-10 DIAGNOSIS — F320 Major depressive disorder, single episode, mild: Secondary | ICD-10-CM

## 2023-09-10 DIAGNOSIS — D869 Sarcoidosis, unspecified: Secondary | ICD-10-CM | POA: Diagnosis not present

## 2023-09-10 DIAGNOSIS — R059 Cough, unspecified: Secondary | ICD-10-CM

## 2023-09-10 DIAGNOSIS — F5101 Primary insomnia: Secondary | ICD-10-CM

## 2023-09-10 LAB — POC COVID19 BINAXNOW: SARS Coronavirus 2 Ag: NEGATIVE

## 2023-09-10 MED ORDER — TEMAZEPAM 7.5 MG PO CAPS
7.5000 mg | ORAL_CAPSULE | Freq: Every evening | ORAL | 0 refills | Status: DC | PRN
Start: 2023-09-10 — End: 2023-09-14

## 2023-09-14 ENCOUNTER — Encounter: Payer: Self-pay | Admitting: Family Medicine

## 2023-09-14 DIAGNOSIS — R059 Cough, unspecified: Secondary | ICD-10-CM

## 2023-09-14 DIAGNOSIS — F5101 Primary insomnia: Secondary | ICD-10-CM

## 2023-09-14 MED ORDER — DOXYCYCLINE HYCLATE 100 MG PO CAPS: 100.0000 mg | ORAL_CAPSULE | Freq: Two times a day (BID) | ORAL | 0 refills | Status: DC

## 2023-09-14 MED ORDER — AZITHROMYCIN 250 MG PO TABS
ORAL_TABLET | ORAL | 0 refills | Status: AC
Start: 2023-09-14 — End: 2023-09-19

## 2023-09-14 MED ORDER — TEMAZEPAM 7.5 MG PO CAPS
7.5000 mg | ORAL_CAPSULE | Freq: Every evening | ORAL | 0 refills | Status: DC | PRN
Start: 2023-09-14 — End: 2023-09-14

## 2023-09-14 MED ORDER — LORAZEPAM 0.5 MG PO TABS
0.5000 mg | ORAL_TABLET | Freq: Every day | ORAL | 0 refills | Status: DC
Start: 2023-09-14 — End: 2024-02-29

## 2023-09-14 NOTE — Addendum Note (Signed)
 Addended by: Abbe Amsterdam C on: 09/14/2023 06:28 PM   Modules accepted: Orders

## 2023-09-21 ENCOUNTER — Ambulatory Visit (HOSPITAL_BASED_OUTPATIENT_CLINIC_OR_DEPARTMENT_OTHER): Payer: Medicare Other | Admitting: Orthopaedic Surgery

## 2023-09-21 ENCOUNTER — Other Ambulatory Visit: Payer: Self-pay | Admitting: Family Medicine

## 2023-09-21 ENCOUNTER — Ambulatory Visit (HOSPITAL_BASED_OUTPATIENT_CLINIC_OR_DEPARTMENT_OTHER): Payer: Medicare Other

## 2023-09-21 DIAGNOSIS — G8929 Other chronic pain: Secondary | ICD-10-CM

## 2023-09-21 DIAGNOSIS — M1711 Unilateral primary osteoarthritis, right knee: Secondary | ICD-10-CM | POA: Diagnosis not present

## 2023-09-21 DIAGNOSIS — I480 Paroxysmal atrial fibrillation: Secondary | ICD-10-CM

## 2023-09-21 DIAGNOSIS — M25561 Pain in right knee: Secondary | ICD-10-CM

## 2023-09-21 NOTE — Progress Notes (Signed)
 Chief Complaint: Right knee pain     History of Present Illness:    Lisa Acosta is a 73 y.o. female presents today for ongoing right knee pain and crepitus.  She is attempting to stay active although this is unfortunately not been possible as she has been having persistent medial based knee pain.  She has been previously treated by Dr. Mariea who is her performed 2 medial meniscal debridements.  She has subsequently had multiple injections including both steroid and hyaluronic acid this year.  She has not done any relief from that.    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   Pre-diabetes    Past Surgical History:  Procedure Laterality Date   BREAST BIOPSY Left    needle core biopsy, benign   BRONCHIAL BIOPSY  06/15/2022   Procedure: BRONCHIAL BIOPSIES;  Surgeon: Meade Verdon RAMAN, MD;  Location: [facility] ENDOSCOPY;  Service: Pulmonary;;   BRONCHIAL BIOPSY  06/29/2022   Procedure: BRONCHIAL BIOPSIES;  Surgeon: Brenna Adine CROME, DO;  Location: [facility] ENDOSCOPY;  Service: Pulmonary;;   BRONCHIAL NEEDLE ASPIRATION BIOPSY  06/15/2022   Procedure: BRONCHIAL NEEDLE ASPIRATION BIOPSIES;  Surgeon: Meade Verdon RAMAN, MD;  Location: [facility] ENDOSCOPY;  Service: Pulmonary;;   BRONCHIAL NEEDLE ASPIRATION BIOPSY  06/29/2022   Procedure: BRONCHIAL NEEDLE ASPIRATION BIOPSIES;  Surgeon: Brenna Adine CROME, DO;   Location: [facility] ENDOSCOPY;  Service: Pulmonary;;   BRONCHIAL WASHINGS  06/15/2022   Procedure: BRONCHIAL WASHINGS;  Surgeon: Meade Verdon RAMAN, MD;  Location: [facility] ENDOSCOPY;  Service: Pulmonary;;   CATARACT EXTRACTION     DILATION AND CURETTAGE OF UTERUS     EYE SURGERY     HAND SURGERY     KNEE SURGERY     ROTATOR CUFF REPAIR     TOTAL ABDOMINAL HYSTERECTOMY     VIDEO BRONCHOSCOPY N/A 06/15/2022   Procedure: VIDEO BRONCHOSCOPY WITH FLUORO;  Surgeon: Meade Verdon RAMAN, MD;  Location: [facility] ENDOSCOPY;  Service: Pulmonary;  Laterality: N/A;   VIDEO BRONCHOSCOPY WITH ENDOBRONCHIAL ULTRASOUND  06/15/2022   Procedure: VIDEO BRONCHOSCOPY WITH ENDOBRONCHIAL ULTRASOUND;  Surgeon: Meade Verdon RAMAN, MD;  Location: [facility] ENDOSCOPY;  Service: Pulmonary;;   VIDEO BRONCHOSCOPY WITH ENDOBRONCHIAL ULTRASOUND N/A 06/29/2022   Procedure: VIDEO BRONCHOSCOPY WITH ENDOBRONCHIAL ULTRASOUND;  Surgeon: Brenna Adine CROME, DO;  Location: [facility] ENDOSCOPY;  Service: Pulmonary;  Laterality: N/A;   Social History   Socioeconomic History   Marital status: Married    Spouse name: John   Number of children: 1   Years of education: [employer] education level: Not on file  Occupational History   Occupation: Retired    Associate Professor: OTHER    Comment: [employer]  Tobacco Use   Smoking status: Never    Passive exposure: Current (doesn't smoke in house now or  past 40yrs)   Smokeless tobacco: Never  Vaping Use   Vaping status: Never Used  Substance and Sexual Activity   Alcohol  use: Yes    Comment: rare   Drug use: No   Sexual activity: Not on file  Other Topics Concern   Not on file  Social History Narrative   Patient lives at home spouse.   Caffeine use: 2 sodas weekly   Right handed    Social Drivers of Health   Financial Resource Strain: Low Risk  (10/27/2021)   Overall Financial Resource Strain (CARDIA)    Difficulty of Paying Living Expenses: Not hard at all  Food Insecurity: No Food Insecurity  (10/27/2021)   Hunger Vital Sign    Worried About Running Out of Food in the Last Year: Never true    Ran Out of Food in the Last Year: Never true  Transportation Needs: No Transportation Needs (10/27/2021)   PRAPARE - Administrator, Civil Service (Medical): No    Lack of Transportation (Non-Medical): No  Physical Activity: Inactive (09/26/2018)   Exercise Vital Sign    Days of Exercise per Week: 0 days    Minutes of Exercise per Session: 0 min  Stress: No Stress Concern Present (10/27/2021)   [employer] of Occupational Health - Occupational Stress Questionnaire    Feeling of Stress : Only a little  Social Connections: Socially Integrated (10/27/2021)   Social Connection and Isolation Panel [NHANES]    Frequency of Communication with Friends and Family: Once a week    Frequency of Social Gatherings with Friends and Family: More than three times a week    Attends Religious Services: More than 4 times per year    Active Member of [employer] or Organizations: Yes    Attends Engineer, Structural: More than 4 times per year    Marital Status: Married   Family History  Problem Relation Age of Onset   Atrial fibrillation Mother    Congestive Heart Failure Mother    Depression Mother    Heart disease Mother    Stroke Mother    Thyroid  disease Mother    Anxiety disorder Mother    Obesity Mother    Heart disease Father    Alcohol  abuse Father    High blood pressure Father    High Cholesterol Father    Alcoholism Father    Depression Sister    Atrial fibrillation Sister    Heart attack Sister    Heart disease Brother    Heart disease Brother    Depression Daughter    Allergies  Allergen Reactions   Dextran Anaphylaxis   Tree Extract    Penicillin G Rash   Penicillins Rash   Sulfa Antibiotics Rash and Other (See Comments)   Wound Dressing Adhesive Rash   Current Outpatient Medications  Medication Sig Dispense Refill   amLODipine  (NORVASC ) 5 MG tablet  Take 1 tablet (5 mg total) by mouth daily. 90 tablet 1   atenolol  (TENORMIN ) 50 MG tablet TAKE 1 TABLET BY MOUTH EVERY DAY 90 tablet 1   Calcium  Carb-Cholecalciferol (CALCIUM  600 + D PO) Take 1 tablet by mouth in the morning and at bedtime.     flecainide  (TAMBOCOR ) 100 MG tablet TAKE 1 TABLET BY MOUTH TWICE A DAY 180 tablet 0   fluticasone  (FLONASE ) 50 MCG/ACT nasal spray Place 2 sprays into both nostrils daily. 48 mL 1   folic acid  (FOLVITE ) 1 MG tablet Take 2 tablets (2 mg total)  by mouth daily. 180 tablet 3   guaiFENesin  (MUCINEX ) 600 MG 12 hr tablet Take 2 tablets (1,200 mg total) by mouth 2 (two) times daily. 30 tablet 0   Krill Oil 500 MG CAPS Take 500 mg by mouth daily.     levothyroxine  (SYNTHROID ) 125 MCG tablet Take 1 tablet (125 mcg total) by mouth daily before breakfast. 90 tablet 1   LORazepam  (ATIVAN ) 0.5 MG tablet Take 1-2 tablets (0.5-1 mg total) by mouth at bedtime. Use as needed for sleep 60 tablet 0   magnesium oxide (MAG-OX) 400 (240 Mg) MG tablet Take 400 mg by mouth daily.     methotrexate  (RHEUMATREX) 2.5 MG tablet Take 8 tablets (20 mg total) by mouth once a week. Caution:Chemotherapy. Protect from light. 96 tablet 2   Multiple Vitamins-Minerals (PRESERVISION AREDS 2+MULTI VIT PO) Take 1 capsule by mouth daily.     omeprazole  (PRILOSEC) 40 MG capsule Take 1 capsule (40 mg total) by mouth in the morning and at bedtime. 180 capsule 1   ondansetron  (ZOFRAN ) 8 MG tablet Take 1 tablet (8 mg total) by mouth every 8 (eight) hours as needed for nausea or vomiting. 30 tablet 2   Propylene Glycol (SYSTANE BALANCE) 0.6 % SOLN Place 1 drop into both eyes as needed (dry eyes).     rivaroxaban  (XARELTO ) 20 MG TABS tablet Take 1 tablet (20 mg total) by mouth daily with supper. 14 tablet 0   rosuvastatin  (CRESTOR ) 20 MG tablet TAKE 1 TABLET BY MOUTH EVERY DAY 90 tablet 3   sertraline  (ZOLOFT ) 50 MG tablet Take 1 tablet (50 mg total) by mouth daily. 90 tablet 3   traZODone  (DESYREL ) 50 MG  tablet TAKE 1.5 TABLETS BY MOUTH AT BEDTIME. 135 tablet 3   No current facility-administered medications for this visit.   No results found.  Review of Systems:   A ROS was performed including pertinent positives and negatives as documented in the HPI.  Physical Exam :   Constitutional: NAD and appears stated age Neurological: Alert and oriented Psych: Appropriate affect and cooperative There were no vitals taken for this visit.   Comprehensive Musculoskeletal Exam:    Right knee with tenderness palpation about the medial joint line.  Range of motion is from 0 to 130 degrees with crepitus.  Distal neurosensory exam is intact   Imaging:   Xray (4 views right knee): Advanced medial tibiofemoral osteoarthritis     I personally reviewed and interpreted the radiographs.   Assessment and Plan:   73 y.o. female with advanced right knee medial tibiofemoral osteoarthritis.  At this time she has had 2 previous meniscal debridements as well as multiple steroid injections as well as hyaluronic acid.  Given her desire to stay active I do believe she would ultimately benefit from a right knee total knee arthroplasty.  Given this I will plan for referral to Dr. Vernetta for discussion of this  -Plan for referral to Dr. Vernetta for discussion of right total knee arthroplasty   I personally saw and evaluated the patient, and participated in the management and treatment plan.  Elspeth Parker, MD Attending Physician, Orthopedic Surgery  This document was dictated using Dragon voice recognition software. A reasonable attempt at proof reading has been made to minimize errors.

## 2023-09-27 ENCOUNTER — Ambulatory Visit: Payer: Medicare Other | Admitting: Nurse Practitioner

## 2023-09-27 ENCOUNTER — Encounter: Payer: Self-pay | Admitting: Nurse Practitioner

## 2023-09-27 VITALS — BP 132/80 | HR 65 | Temp 98.5°F | Ht 61.0 in | Wt 169.0 lb

## 2023-09-27 DIAGNOSIS — F320 Major depressive disorder, single episode, mild: Secondary | ICD-10-CM

## 2023-09-27 DIAGNOSIS — Z6831 Body mass index (BMI) 31.0-31.9, adult: Secondary | ICD-10-CM

## 2023-09-27 DIAGNOSIS — E669 Obesity, unspecified: Secondary | ICD-10-CM | POA: Diagnosis not present

## 2023-09-27 NOTE — Progress Notes (Signed)
Office: [phone]  /  Fax: [phone]  WEIGHT SUMMARY AND BIOMETRICS  Weight Lost Since Last Visit: 0lb  Weight Gained Since Last Visit: 5lb   Vitals Temp: 98.5 F (36.9 C) BP: 132/80 Pulse Rate: 65 SpO2: 95 %   Anthropometric Measurements Height: 5\' 1"  (1.549 m) Weight: 169 lb (76.7 kg) BMI (Calculated): 31.95 Weight at Last Visit: 164lb Weight Lost Since Last Visit: 0lb Weight Gained Since Last Visit: 5lb Starting Weight: 168lb Total Weight Loss (lbs): 0 lb (0 kg)   Body Composition  Body Fat %: 46.2 % Fat Mass (lbs): 78.2 lbs Muscle Mass (lbs): 86.6 lbs Total Body Water (lbs): 63.4 lbs Visceral Fat Rating : 14   Other Clinical Data Fasting: No Labs: No Today's Visit #: 25 Starting Date: 01/05/21     HPI  Chief Complaint: OBESITY  Lisa Acosta is here to discuss her progress with her obesity treatment plan. She is on the practicing portion control and making smarter food choices, such as increasing vegetables and decreasing simple carbohydrates and states she is following her eating plan approximately 50 % of the time. She states she is exercising 0 minutes 0 days per week.   Interval History:  Since last office visit she has gained 5 pounds. She notes she has gotten off track and hasn't followed the plan due to the holidays and not feeling well.  She struggles with liking a lot of foods since having Covid in July.   She has an appt with otolaryngology on 10/01/23 for continued cough. She struggles with knee pain and saw ortho last on 09/21/23. Right knee total arthroplasty was recommended. She has an appt with Dr. Magnus Ivan on 10/15/23.  She is under stress at home. Her husband is being evaluated for a spot found on his lung on his last MRI.  She hasn't been able to focus on herself. She is drinking fairlife milk, sometimes a protein shake and water.  Her personal goal is to be able to start going back to the fitness center and start walking. She would like to get  back on track.    Pharmacotherapy for weight loss: She is not currently taking medications  for medical weight loss.   Depression Saw here PCP last on 09/10/23.  Effexor was stopped due to side effects and she started taking Zoloft 50mg  and loarazepam 0.5mg   She reports she is doing well with the current dose of Zoloft.  Denies side effects.  She notes one night of hallucinations with lorazepam. Has been taking nightly for the past week without any side effects.  Plans to continue to follow up with PCP to discuss dosage.   PHYSICAL EXAM:  Blood pressure 132/80, pulse 65, temperature 98.5 F (36.9 C), height 5\' 1"  (1.549 m), weight 169 lb (76.7 kg), SpO2 95%. Body mass index is 31.93 kg/m.  General: She is overweight, cooperative, alert, well developed, and in no acute distress. PSYCH: Has normal mood, affect and thought process.   Extremities: No edema.  Neurologic: No gross sensory or motor deficits. No tremors or fasciculations noted.    DIAGNOSTIC DATA REVIEWED:  BMET    Component Value Date/Time   NA 143 09/03/2023 1525   K 4.0 09/03/2023 1525   CL 103 09/03/2023 1525   CO2 26 09/03/2023 1525   GLUCOSE 98 09/03/2023 1525   GLUCOSE 120 (H) 06/15/2022 0935   BUN 21 09/03/2023 1525   CREATININE 0.69 09/03/2023 1525   CALCIUM 9.8 09/03/2023 1525   GFRNONAA >60  06/15/2022 0935   GFRAA >60 09/24/2019 1224   Lab Results  Component Value Date   HGBA1C 5.7 01/10/2023   HGBA1C 6.0 01/22/2017   Lab Results  Component Value Date   INSULIN 20.4 01/05/2021   Lab Results  Component Value Date   TSH 1.13 01/10/2023   CBC    Component Value Date/Time   WBC 6.7 09/03/2023 1525   WBC 6.7 06/15/2022 0935   RBC 4.37 09/03/2023 1525   RBC 4.43 06/15/2022 0935   HGB 12.9 09/03/2023 1525   HCT 39.9 09/03/2023 1525   PLT 253 09/03/2023 1525   MCV 91 09/03/2023 1525   MCH 29.5 09/03/2023 1525   MCH 29.6 06/15/2022 0935   MCHC 32.3 09/03/2023 1525   MCHC 32.7 06/15/2022 0935    RDW 12.4 09/03/2023 1525   Iron Studies No results found for: "IRON", "TIBC", "FERRITIN", "IRONPCTSAT" Lipid Panel     Component Value Date/Time   CHOL 151 01/10/2023 1430   CHOL 186 01/05/2021 1040   TRIG 74.0 01/10/2023 1430   HDL 66.70 01/10/2023 1430   HDL 67 01/05/2021 1040   CHOLHDL 2 01/10/2023 1430   VLDL 14.8 01/10/2023 1430   LDLCALC 70 01/10/2023 1430   LDLCALC 95 01/05/2021 1040   LDLCALC 103 (H) 06/23/2020 1148   LDLDIRECT 112.0 01/22/2017 1559   Hepatic Function Panel     Component Value Date/Time   PROT 7.0 09/03/2023 1525   ALBUMIN 4.2 09/03/2023 1525   AST 36 09/03/2023 1525   ALT 28 09/03/2023 1525   ALKPHOS 104 09/03/2023 1525   BILITOT 0.8 09/03/2023 1525   BILIDIR 0.1 06/23/2020 1148   BILIDIR 0.24 04/30/2020 1048   IBILI 0.7 06/23/2020 1148      Component Value Date/Time   TSH 1.13 01/10/2023 1430   Nutritional Lab Results  Component Value Date   VD25OH 79.10 11/16/2021   VD25OH 25.0 (L) 01/05/2021     ASSESSMENT AND PLAN  TREATMENT PLAN FOR OBESITY:  Recommended Dietary Goals  Lisa Acosta is currently in the action stage of change. As such, her goal is to continue weight management plan. She has agreed to practicing portion control and making smarter food choices, such as increasing vegetables and decreasing simple carbohydrates.  Behavioral Intervention  We discussed the following Behavioral Modification Strategies today: increasing lean protein intake to established goals, decreasing simple carbohydrates , increasing vegetables, increasing water intake , work on meal planning and preparation, reading food labels , keeping healthy foods at home, identifying sources and decreasing liquid calories, continue to practice mindfulness when eating, planning for success, better snacking choices, and continue to work on maintaining a reduced calorie state, getting the recommended amount of protein, incorporating whole foods, making healthy choices,  staying well hydrated and practicing mindfulness when eating..  Additional resources provided today: NA  Recommended Physical Activity Goals  Lisa Acosta has been advised to work up to 150 minutes of moderate intensity aerobic activity a week and strengthening exercises 2-3 times per week for cardiovascular health, weight loss maintenance and preservation of muscle mass.   She has agreed to Think about enjoyable ways to increase daily physical activity and overcoming barriers to exercise, Increase physical activity in their day and reduce sedentary time (increase NEAT)., and Unable to participate in physical activity at present due to medical conditions -she is struggling with coughing and knee pain.    ASSOCIATED CONDITIONS ADDRESSED TODAY  Action/Plan  Depression, major, single episode, mild (HCC) Continue to follow up with PCP.  Take meds as directed  Generalized obesity  BMI 31.0-31.9,adult         Return in about 6 weeks (around 11/08/2023).[name] She was informed of the importance of frequent follow up visits to maximize her success with intensive lifestyle modifications for her multiple health conditions.   ATTESTASTION STATEMENTS:  Reviewed by clinician on day of visit: allergies, medications, problem list, medical history, surgical history, family history, social history, and previous encounter notes.   Time spent on visit including pre-visit chart review and post-visit care and charting was 30+ minutes.    Theodis Sato. Jayshon Dommer FNP-C

## 2023-09-30 ENCOUNTER — Other Ambulatory Visit: Payer: Self-pay | Admitting: Family Medicine

## 2023-09-30 DIAGNOSIS — R002 Palpitations: Secondary | ICD-10-CM

## 2023-10-01 ENCOUNTER — Ambulatory Visit (INDEPENDENT_AMBULATORY_CARE_PROVIDER_SITE_OTHER): Payer: Medicare Other | Admitting: Otolaryngology

## 2023-10-01 ENCOUNTER — Encounter (INDEPENDENT_AMBULATORY_CARE_PROVIDER_SITE_OTHER): Payer: Self-pay | Admitting: Otolaryngology

## 2023-10-01 VITALS — BP 133/75 | HR 69 | Ht 61.0 in | Wt 170.0 lb

## 2023-10-01 DIAGNOSIS — J3089 Other allergic rhinitis: Secondary | ICD-10-CM

## 2023-10-01 DIAGNOSIS — R0982 Postnasal drip: Secondary | ICD-10-CM | POA: Diagnosis not present

## 2023-10-01 DIAGNOSIS — R053 Chronic cough: Secondary | ICD-10-CM

## 2023-10-01 DIAGNOSIS — R0981 Nasal congestion: Secondary | ICD-10-CM

## 2023-10-01 DIAGNOSIS — K219 Gastro-esophageal reflux disease without esophagitis: Secondary | ICD-10-CM | POA: Diagnosis not present

## 2023-10-01 MED ORDER — CETIRIZINE HCL 10 MG PO TABS: 10.0000 mg | ORAL_TABLET | Freq: Every day | ORAL | 11 refills | Status: AC

## 2023-10-01 MED ORDER — FLUTICASONE PROPIONATE 50 MCG/ACT NA SUSP: 2.0000 | Freq: Two times a day (BID) | NASAL | 6 refills | Status: AC

## 2023-10-01 NOTE — Progress Notes (Unsigned)
ENT CONSULT:  Reason for Consult: chronic cough    HPI: Discussed the use of AI scribe software for clinical note transcription with the patient, who gave verbal consent to proceed.  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 She has been on methotrexate for sarcoidosis for a year, which has led to some improvement. However, the cough, initially improving, worsened after contracting COVID-19 in July. Despite two rounds of antibiotics, the cough persisted and was accompanied by a significant increase in mucus production. The mucus, described as thick and sometimes bloody, is expelled both through coughing and nasal drainage.  The patient also reports a history of an esophageal ulcer and current symptoms of a 'weird' feeling in the throat, although it is not described as painful. She has been taking Mucinex as advised by her pulmonologist, but reports no significant improvement. She also has a history of heartburn and has been taking omeprazole, although not consistently due to other health concerns.  The patient's cough is severe enough to disrupt sleep, despite taking prescription cough medicine and cough drops. She expresses concern about choking on the cough drops due to the severity of the cough. The cough is also disruptive to her daily activities and is causing distress to her family.  The patient has a history of hypertension, managed with atenolol, and atrial fibrillation, managed with amlodipine and flecainide. She also takes Xarelto for AFib, Crestor for cholesterol, and Synthroid for hypothyroidism. She has a history of allergy shots, but these were discontinued after moving to a new location. She currently uses Flonase sporadically and takes either Claritin or Allegra for allergies. She also uses oxygen at night due to a  history of oxygen levels dropping to 75 during sleep.       Records Reviewed:  Office visit with Pulm 09/03/23 Ms. Olubunmi Harkless is a 73 year old female never smoker with pulmonary sarcoid GERD, HTN, HLD, atrial fibrillation who presents for follow-up.   Synopsis She was previously seen by Dr. Tonia Brooms for pulmonary sarcoid and started on steroids in October 2023 however discontinued in January due to 20 lb weight gain and headaches, irritiability and darker stool. Cough had initially started six months ago. She reports significant fatigue and prior sleep studies have been negative. She is wearing oxygen nightly. Started methotrexate in Feb 2024   2024 - Started on methotrexate in Feb with improved respiratory symptoms. Improved but persistent cough and upper airway mucous production.   09/03/23 Since our last visit she has had side effects related to antidepressants. She reports cough unchanged. Some sputum in her mouth and not necessarily from her cough. Has a sore throat. Compliant with nocturnal oxygen. Daughter present and provides additional history.   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   Pre-diabetes     Past Surgical History:  Procedure Laterality Date   BREAST BIOPSY Left    needle core biopsy, benign   BRONCHIAL BIOPSY  06/15/2022   Procedure: BRONCHIAL BIOPSIES;  Surgeon: Charlott Holler, MD;  Location: [facility] ENDOSCOPY;  Service:  Pulmonary;;   BRONCHIAL BIOPSY  06/29/2022   Procedure: BRONCHIAL BIOPSIES;  Surgeon: Josephine Igo, DO;  Location: [facility] ENDOSCOPY;  Service: Pulmonary;;   BRONCHIAL NEEDLE ASPIRATION BIOPSY  06/15/2022   Procedure: BRONCHIAL NEEDLE ASPIRATION BIOPSIES;  Surgeon: Charlott Holler, MD;  Location: [facility] ENDOSCOPY;  Service: Pulmonary;;   BRONCHIAL NEEDLE ASPIRATION BIOPSY  06/29/2022   Procedure: BRONCHIAL NEEDLE ASPIRATION BIOPSIES;  Surgeon: Josephine Igo, DO;  Location: [facility] ENDOSCOPY;  Service: Pulmonary;;   BRONCHIAL WASHINGS  06/15/2022   Procedure: BRONCHIAL WASHINGS;  Surgeon: Charlott Holler, MD;  Location: [facility] ENDOSCOPY;  Service: Pulmonary;;   CATARACT EXTRACTION     DILATION AND CURETTAGE OF UTERUS     EYE SURGERY     HAND SURGERY     KNEE SURGERY     ROTATOR CUFF REPAIR     TOTAL ABDOMINAL HYSTERECTOMY     VIDEO BRONCHOSCOPY N/A 06/15/2022   Procedure: VIDEO BRONCHOSCOPY WITH FLUORO;  Surgeon: Charlott Holler, MD;  Location: [facility] ENDOSCOPY;  Service: Pulmonary;  Laterality: N/A;   VIDEO BRONCHOSCOPY WITH ENDOBRONCHIAL ULTRASOUND  06/15/2022   Procedure: VIDEO BRONCHOSCOPY WITH ENDOBRONCHIAL ULTRASOUND;  Surgeon: Charlott Holler, MD;  Location: [facility] ENDOSCOPY;  Service: Pulmonary;;   VIDEO BRONCHOSCOPY WITH ENDOBRONCHIAL ULTRASOUND N/A 06/29/2022   Procedure: VIDEO BRONCHOSCOPY WITH ENDOBRONCHIAL ULTRASOUND;  Surgeon: Josephine Igo, DO;  Location: [facility] ENDOSCOPY;  Service: Pulmonary;  Laterality: N/A;    Family History  Problem Relation Age of Onset   Atrial fibrillation Mother    Congestive Heart Failure Mother    Depression Mother    Heart disease Mother    Stroke Mother    Thyroid disease Mother    Anxiety disorder Mother    Obesity Mother    Heart disease Father    Alcohol abuse Father    High blood pressure Father    High Cholesterol Father    Alcoholism Father    Depression Sister    Atrial fibrillation Sister    Heart attack Sister    Heart disease Brother     Heart disease Brother    Depression Daughter     Social History:  reports that she has never smoked. She has been exposed to tobacco smoke. She has never used smokeless tobacco. She reports current alcohol use. She reports that she does not use drugs.  Allergies:  Allergies  Allergen Reactions   Dextran Anaphylaxis   Tree Extract    Penicillin G Rash   Penicillins Rash   Sulfa Antibiotics Rash and Other (See Comments)   Wound Dressing Adhesive Rash    Medications: I have reviewed the patient's current medications.  The PMH, PSH, Medications, Allergies, and SH were reviewed and updated.  ROS: Constitutional: Negative for fever, weight loss and weight gain. Cardiovascular: Negative for chest pain and dyspnea on exertion. Respiratory: Is not experiencing shortness of breath at rest. Gastrointestinal: Negative for nausea and vomiting. Neurological: Negative for headaches. Psychiatric: The patient is not nervous/anxious  Blood pressure 133/75, pulse 69, height 5\' 1"  (1.549 m), weight 170 lb (77.1 kg), SpO2 95%.  PHYSICAL EXAM:  Exam: General: Well-developed, well-nourished  Communication and Voice: Clear pitch and clarity Respiratory Respiratory effort: Equal inspiration and expiration without stridor Cardiovascular Peripheral Vascular: Warm extremities with equal color/perfusion Eyes: No nystagmus with equal extraocular motion bilaterally Neuro/Psych/Balance: Patient oriented to person, place, and time; Appropriate mood and affect; Gait is intact with no imbalance; Cranial nerves I-XII are intact Head and Face Inspection: Normocephalic and atraumatic without mass or lesion Palpation: Facial skeleton intact without bony stepoffs Salivary Glands: No mass or tenderness Facial Strength: Facial motility symmetric and full bilaterally ENT Pinna: External ear intact and fully developed External canal: Canal is patent with intact skin Tympanic Membrane: Clear and mobile External  Nose: No scar or anatomic deformity Internal Nose: Septum is ***. No polyp, or purulence. Mucosal edema and erythema present.  Bilateral inferior turbinate hypertrophy.  Lips, Teeth, and gums: Mucosa and teeth intact and viable TMJ: No pain to palpation with full mobility Oral cavity/oropharynx: No erythema or exudate, no lesions present Nasopharynx: No mass or lesion with intact mucosa Hypopharynx: Intact mucosa without pooling of secretions Larynx Glottic: Full true vocal cord mobility without lesion or mass Supraglottic: Normal appearing epiglottis and AE folds Interarytenoid Space: Moderate pachydermia&edema Subglottic Space: Patent without lesion or edema Neck Neck and Trachea: Midline trachea without mass or lesion Thyroid: No mass or nodularity Lymphatics: No lymphadenopathy  Procedure: Preoperative diagnosis:  Postoperative diagnosis:   Same  Procedure: Flexible fiberoptic laryngoscopy  Surgeon: Ashok Croon, MD  Anesthesia: Topical lidocaine and Afrin Complications: None Condition is stable throughout exam  Indications and consent:  The patient presents to the clinic with Indirect laryngoscopy view was incomplete. Thus it was recommended that they undergo a flexible fiberoptic laryngoscopy. All of the risks, benefits, and potential complications were reviewed with the patient preoperatively and verbal informed consent was obtained.  Procedure: The patient was seated upright in the clinic. Topical lidocaine and Afrin were applied to the nasal cavity. After adequate anesthesia had occurred, I then proceeded to pass the flexible telescope into the nasal cavity. The nasal cavity was patent without rhinorrhea or polyp. The nasopharynx was also patent without mass or lesion. The base of tongue was visualized and was normal. There were no signs of pooling of secretions in the piriform sinuses. The true vocal folds were mobile bilaterally. There were no signs of glottic or  supraglottic mucosal lesion or mass. There was *** interarytenoid pachydermia and post cricoid edema. The telescope was then slowly withdrawn and the patient tolerated the procedure throughout.      Studies Reviewed: CT chest 08/13/23 FINDINGS: Cardiovascular: The heart is normal in size. No pericardial effusion.   No evidence of thoracic aortic aneurysm. Atherosclerotic calcifications of the aortic arch.   Mild coronary atherosclerosis of the LAD and left circumflex.   Mediastinum/Nodes: Small mediastinal lymph nodes measuring up to 7 mm short axis, likely reactive.   Visualized thyroid is unremarkable.   Lungs/Pleura: Subpleural sub solid opacity in the posterior left upper lobe (series 302/image 28), with 1.3 x 2.9 cm solid component, unchanged.   7 mm solid nodule in the medial right middle lobe (series 302/image 33), unchanged.   Very mild subpleural reticulation/fibrosis in the lungs bilaterally, lower lobe predominant, unchanged.   No pleural effusion or pneumothorax.   Upper Abdomen: Visualized upper abdomen is grossly unremarkable, noting vascular calcifications.   Musculoskeletal: Mild degenerative changes of the visualized thoracolumbar spine.   IMPRESSION: Stable left upper lobe lesion, presumed to be related to pulmonary sarcoidosis. Stable small mediastinal nodes, likely reactive.  7 mm solid nodule in the medial right middle lobe, unchanged. No follow-up is recommended per Fleischner Society guidelines.   Stable mild subpleural reticulation/fibrosis in the lower lobes, likely related to known sarcoidosis.  PFTs 09/03/23  PFT: 08/29/16  FVC 2.63 (95%) FEV1 2.21 (105%) Ratio 84  TLC 91% DLCO 75% Interpretation: Normal spirometry and lung volumes. Mildly reduced DLCO.   10/05/22 FVC 2.37 (92%) FEV1 2.02 (104%) Ratio 85  TLC 87% DLCO 65% Interpretation: Normal spirometry and lung volumes. Compared to prior reduced DLCO, remains mild     Assessment/Plan: No diagnosis found.  ***  Thank you for allowing me to participate in the care of this patient. Please do not hesitate to contact me with any questions or concerns.   Ashok Croon, MD Otolaryngology [facility] Phone: [phone] Fax: [phone]    10/01/2023, 3:41 PM

## 2023-10-01 NOTE — Patient Instructions (Signed)
Lloyd Huger Med Nasal Saline Rinse   - start nasal saline rinses with [employer]Med Bottle available over the counter or online to help with nasal congestion

## 2023-10-04 DIAGNOSIS — Z23 Encounter for immunization: Secondary | ICD-10-CM | POA: Diagnosis not present

## 2023-10-09 DIAGNOSIS — F331 Major depressive disorder, recurrent, moderate: Secondary | ICD-10-CM | POA: Diagnosis not present

## 2023-10-15 ENCOUNTER — Ambulatory Visit (INDEPENDENT_AMBULATORY_CARE_PROVIDER_SITE_OTHER): Payer: Medicare Other | Admitting: Orthopaedic Surgery

## 2023-10-15 ENCOUNTER — Encounter: Payer: Self-pay | Admitting: Orthopaedic Surgery

## 2023-10-15 ENCOUNTER — Encounter: Payer: Self-pay | Admitting: Family Medicine

## 2023-10-15 VITALS — Ht 61.0 in | Wt 170.0 lb

## 2023-10-15 DIAGNOSIS — M1711 Unilateral primary osteoarthritis, right knee: Secondary | ICD-10-CM | POA: Insufficient documentation

## 2023-10-15 DIAGNOSIS — G8929 Other chronic pain: Secondary | ICD-10-CM | POA: Diagnosis not present

## 2023-10-15 DIAGNOSIS — M25561 Pain in right knee: Secondary | ICD-10-CM | POA: Diagnosis not present

## 2023-10-15 NOTE — Progress Notes (Signed)
The patient is a pleasant 73 year old female that I am seeing for the first time.  She is referred to me by my partner Dr. 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  She has had at least 2 different arthroscopic procedures on that knee years ago that involved the meniscus.  She has had multiple steroid injections and gel injections.  At this point her right knee pain is daily and it is detrimentally affecting her mobility, her quality of life and her actives daily living.  She does wish to consider knee replacement surgery.  She does ambulate with a rolling walker.  She lives with her husband and does stay with family.  She did let me know though that her husband is dealing with a recent diagnosis of potential lung cancer.  There is a family history of that and his health is taking precedence over hers right now.  Examination of her right knee shows neutral alignment.  There is significant medial joint line tenderness and patellofemoral crepitation.    X-rays on the canopy system shows tricompartment arthritis of the right knee with slight varus malalignment with medial joint space bone-on-bone wear as well as patellofemoral narrowing.  We did go over her x-rays and talk about knee replacement surgery.  I showed her knee replacement model.  She is on Xarelto.  I was able to review all of her medications and past medical history within epic.  She is well-controlled with her A-fib and this is also something that runs in her family.  She sees her cardiologist on a regular basis.  She understands that she would need to be off of Xarelto for 3 full days before having surgery.  She has our surgery scheduler's card.  She will let us know when she would like to be considered for surgery when she is more certain about what her husband is going through with his lung situation.  All questions and concerns were addressed and answered.

## 2023-10-16 DIAGNOSIS — F331 Major depressive disorder, recurrent, moderate: Secondary | ICD-10-CM | POA: Diagnosis not present

## 2023-10-30 DIAGNOSIS — F331 Major depressive disorder, recurrent, moderate: Secondary | ICD-10-CM | POA: Diagnosis not present

## 2023-10-31 ENCOUNTER — Other Ambulatory Visit: Payer: Self-pay | Admitting: Family Medicine

## 2023-10-31 DIAGNOSIS — E034 Atrophy of thyroid (acquired): Secondary | ICD-10-CM

## 2023-10-31 DIAGNOSIS — E7800 Pure hypercholesterolemia, unspecified: Secondary | ICD-10-CM

## 2023-11-02 ENCOUNTER — Other Ambulatory Visit (HOSPITAL_BASED_OUTPATIENT_CLINIC_OR_DEPARTMENT_OTHER): Payer: Self-pay | Admitting: Family Medicine

## 2023-11-02 DIAGNOSIS — Z139 Encounter for screening, unspecified: Secondary | ICD-10-CM

## 2023-11-03 ENCOUNTER — Emergency Department (HOSPITAL_BASED_OUTPATIENT_CLINIC_OR_DEPARTMENT_OTHER)
Admission: EM | Admit: 2023-11-03 | Discharge: 2023-11-03 | Disposition: A | Discharge status: Home / Self Care | Payer: Medicare Other | Attending: Emergency Medicine | Admitting: Emergency Medicine

## 2023-11-03 ENCOUNTER — Other Ambulatory Visit: Payer: Self-pay

## 2023-11-03 ENCOUNTER — Encounter (HOSPITAL_BASED_OUTPATIENT_CLINIC_OR_DEPARTMENT_OTHER): Payer: Self-pay | Admitting: Emergency Medicine

## 2023-11-03 DIAGNOSIS — X58XXXA Exposure to other specified factors, initial encounter: Secondary | ICD-10-CM | POA: Diagnosis not present

## 2023-11-03 DIAGNOSIS — T50901A Poisoning by unspecified drugs, medicaments and biological substances, accidental (unintentional), initial encounter: Secondary | ICD-10-CM | POA: Diagnosis not present

## 2023-11-03 DIAGNOSIS — E039 Hypothyroidism, unspecified: Secondary | ICD-10-CM | POA: Diagnosis not present

## 2023-11-03 DIAGNOSIS — I10 Essential (primary) hypertension: Secondary | ICD-10-CM | POA: Diagnosis not present

## 2023-11-03 DIAGNOSIS — T462X1A Poisoning by other antidysrhythmic drugs, accidental (unintentional), initial encounter: Secondary | ICD-10-CM | POA: Diagnosis not present

## 2023-11-03 DIAGNOSIS — R001 Bradycardia, unspecified: Secondary | ICD-10-CM | POA: Diagnosis not present

## 2023-11-03 NOTE — ED Provider Notes (Signed)
 Emergency Department Provider Note   I have reviewed the triage vital signs and the nursing notes.   HISTORY  Chief Complaint No chief complaint on file.   HPI Lisa Acosta is a 73 y.o. female with past history reviewed below presents the emergency department after inadvertently taking an extra dose of her flecainide.  She typically takes her dose at around 10 PM, which she did.  She woke around midnight and was slightly confused with sleep and took an additional dose of her flecainide.  She woke up at 2:30 AM and realized her mistake.  She had no symptoms.  She called [employer] poison control who advised that she present to the emergency department for monitoring and EKG. describes some mild lightheadedness at this time but no syncope, palpitations, chest discomfort.    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 Respiratory: Denies shortness of breath. Gastrointestinal: No abdominal pain.  No nausea, no vomiting.   Musculoskeletal: Negative for back pain. Skin: Negative for rash. Neurological: Negative for headaches.  ____________________________________________   PHYSICAL EXAM:  VITAL SIGNS: ED Triage Vitals  Encounter Vitals Group     BP 11/03/23 0336 (!) 144/70     Pulse Rate 11/03/23 0336 (!) 51     Resp 11/03/23  0336 16     Temp 11/03/23 0336 98.1 F (36.7 C)     Temp Source 11/03/23 0336 Oral     SpO2 11/03/23 0336 97 %     Weight 11/03/23 0338 171 lb (77.6 kg)     Height 11/03/23 0338 5\' 1"  (1.549 m)   Constitutional: Alert and oriented. Well appearing and in no acute distress. Eyes: Conjunctivae are normal.  Head: Atraumatic. Nose: No congestion/rhinnorhea. Mouth/Throat: Mucous membranes are moist.   Neck: No stridor.   Cardiovascular: Sinus bradycardia. Good peripheral circulation. Grossly normal heart sounds.   Respiratory: Normal respiratory effort.   Gastrointestinal: No distention.  Musculoskeletal: No gross deformities of extremities. Neurologic:  Normal speech and language.  Skin:  Skin is warm, dry and intact. No rash noted.  ____________________________________________  EKG   EKG Interpretation Date/Time:  Saturday November 03 2023 03:42:26 EST Ventricular Rate:  49 PR Interval:  274 QRS Duration:  108 QT Interval:  473 QTC Calculation: 427 R Axis:   47  Text Interpretation: Sinus bradycardia Prolonged PR interval Confirmed by Alona Bene [phone]) on 11/03/2023 3:55:12 AM       ____________________________________________   PROCEDURES  Procedure(s) performed:   Procedures  None  ____________________________________________   INITIAL IMPRESSION / ASSESSMENT AND PLAN / ED COURSE  Pertinent labs & imaging results that were available during my care of the patient were reviewed by me and considered in my medical decision making (see chart  for details).   This patient is Presenting for Evaluation of unintentional OD, which does require a range of treatment options, and is a complaint that involves a high risk of morbidity and mortality.  The Differential Diagnoses include arrhythmia, hypotension, cardiac arrest, etc.  I did obtain Additional Historical Information from husband at bedside.  Cardiac Monitor Tracing which shows sinus bradycardia.   Consult  complete with [place] Poison Control. Recommend EKG and 8 hr observation from the time of ingestion (midnight). No need for labs or intervention unless patient becomes unstable.   Medical Decision Making: Summary:  Patient presents emergency department after intentionally taking an additional dose of her flecainide at midnight.  She is feeling okay other than some mild lightheadedness.  Bradycardia noted on EKG without other high-grade block or acute arrhythmia.  This tracing is similar to one from September 2024.  [employer] [place] poison control recommends 8-hour observation from the time of ingestion and so we will observe on cardiac monitor here until 8 AM.   Reevaluation with update and discussion with patient. Continuing observation. Care transferred to oncoming team.   Patient's presentation is most consistent with acute presentation with potential threat to life or bodily function.   Disposition: pending   ____________________________________________  FINAL CLINICAL IMPRESSION(S) / ED DIAGNOSES  Final diagnoses:  OD (overdose of drug), accidental or unintentional, initial encounter    Note:  This document was prepared using Dragon voice recognition software and may include unintentional dictation errors.  Alona Bene, MD, [facility]    Jamyria Ozanich, Arlyss Repress, MD 11/03/23 [phone]

## 2023-11-03 NOTE — ED Triage Notes (Signed)
 Pt reports she took a second dose of her flecainide accidentally.  Pt states she feels "a little dizzy."  Pt is alert and oriented.

## 2023-11-03 NOTE — ED Notes (Signed)
 Pt's husband Jemma Rasp [phone]) is going home to rest asked to be called when pt is discharged or close to being discharged since pt left her cell phone at home.

## 2023-11-06 ENCOUNTER — Ambulatory Visit (HOSPITAL_BASED_OUTPATIENT_CLINIC_OR_DEPARTMENT_OTHER): Payer: Medicare Other | Admitting: Pulmonary Disease

## 2023-11-06 ENCOUNTER — Encounter (HOSPITAL_BASED_OUTPATIENT_CLINIC_OR_DEPARTMENT_OTHER): Payer: Self-pay | Admitting: Pulmonary Disease

## 2023-11-06 VITALS — BP 124/80 | HR 59 | Ht 61.0 in | Wt 176.2 lb

## 2023-11-06 DIAGNOSIS — D869 Sarcoidosis, unspecified: Secondary | ICD-10-CM | POA: Diagnosis not present

## 2023-11-06 NOTE — Patient Instructions (Addendum)
 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 Ok to reduce to 1 pill daily once you are on 10 mg of methotrexate --If planning for surgery. Please stop methotrexate for one week before surgery and the week of surgery. Restart at prior dosing.  Plan for CT Chest without contrast in Sept 2025

## 2023-11-06 NOTE — Progress Notes (Signed)
 Subjective:   PATIENT ID: [name] GENDER: female DOB: 11-Aug-1951, MRN: [id number]  Chief Complaint  Patient presents with   Follow-up    Sardoidosis    Reason for Visit: Follow-up  Ms. Seini Hackley is a 73 year old female never smoker with pulmonary sarcoid GERD, HTN, HLD, atrial fibrillation who presents for follow-up.  Synopsis She was previously seen by Dr. Tonia Brooms for pulmonary sarcoid and started on steroids in October 2023 however discontinued in January due to 20 lb weight gain and headaches, irritiability and darker stool. Cough had initially started six months ago. She reports significant fatigue and prior sleep studies have been negative. She is wearing oxygen nightly. Started methotrexate in Feb 2024  2024 - Started on methotrexate in Feb with improved respiratory symptoms. Improved but persistent cough and upper airway mucous production.  09/03/23 Since our last visit she has had side effects related to antidepressants. She reports cough unchanged. Some sputum in her mouth and not necessarily from her cough. Has a sore throat. Compliant with nocturnal oxygen. Daughter present and provides additional history.  11/06/23 Since our last visit she was seen by ENT and has been doing saline rinses, flonase and allergy pills which has helped. Cough has improved. Recently seen in the ED for confusion and accidentally took additional dose of flecainide. She is concerned methotrexate is contributing to this. Otherwise doing well. Daughter present and provides additional history.  Social History: Never smoker. 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   Pre-diabetes      Family History  Problem Relation Age of Onset   Atrial fibrillation Mother    Congestive Heart Failure Mother    Depression Mother    Heart disease Mother    Stroke Mother    Thyroid disease Mother    Anxiety disorder Mother    Obesity Mother    Heart disease Father    Alcohol abuse Father    High blood pressure Father    High Cholesterol Father    Alcoholism Father    Depression Sister    Atrial fibrillation Sister    Heart attack Sister    Heart disease Brother    Heart disease Brother    Depression Daughter      Social History   Occupational History   Occupation: Retired    Associate Professor: OTHER    Comment: Administrator, [employer]  Tobacco Use   Smoking status: Never    Passive exposure: Current (doesn't smoke in house now or past 36yrs)   Smokeless tobacco: Never  Vaping Use   Vaping status: Never Used  Substance and Sexual Activity   Alcohol use: Yes    Comment: rare   Drug use: No   Sexual activity: Not on file    Allergies  Allergen Reactions  Dextran Anaphylaxis   Tree Extract    Penicillin G Rash   Penicillins Rash   Sulfa Antibiotics Rash and Other (See Comments)   Wound Dressing Adhesive Rash     Outpatient Medications Prior to Visit  Medication Sig Dispense Refill   amLODipine (NORVASC) 5 MG tablet Take 1 tablet (5 mg total) by mouth daily. 90 tablet 1   atenolol (TENORMIN) 50 MG tablet TAKE 1 TABLET BY MOUTH EVERY DAY 90 tablet 1   Calcium Carb-Cholecalciferol (CALCIUM 600 + D PO) Take 1 tablet by mouth in the morning and at bedtime.     cetirizine (ZYRTEC) 10 MG tablet Take 1 tablet (10 mg total) by mouth daily. 30 tablet 11   flecainide (TAMBOCOR) 100 MG tablet TAKE 1 TABLET BY MOUTH  TWICE A DAY 180 tablet 0   fluticasone (FLONASE) 50 MCG/ACT nasal spray Place 2 sprays into both nostrils 2 (two) times daily. 16 g 6   folic acid (FOLVITE) 1 MG tablet Take 2 tablets (2 mg total) by mouth daily. 180 tablet 3   guaiFENesin (MUCINEX) 600 MG 12 hr tablet Take 2 tablets (1,200 mg total) by mouth 2 (two) times daily. 30 tablet 0   levothyroxine (SYNTHROID) 125 MCG tablet Take 1 tablet (125 mcg total) by mouth daily before breakfast. 90 tablet 1   magnesium oxide (MAG-OX) 400 (240 Mg) MG tablet Take 400 mg by mouth daily.     methotrexate (RHEUMATREX) 2.5 MG tablet Take 8 tablets (20 mg total) by mouth once a week. Caution:Chemotherapy. Protect from light. 96 tablet 2   Multiple Vitamins-Minerals (PRESERVISION AREDS 2+MULTI VIT PO) Take 1 capsule by mouth daily.     omeprazole (PRILOSEC) 40 MG capsule Take 1 capsule (40 mg total) by mouth in the morning and at bedtime. 180 capsule 1   ondansetron (ZOFRAN) 8 MG tablet Take 1 tablet (8 mg total) by mouth every 8 (eight) hours as needed for nausea or vomiting. 30 tablet 2   Propylene Glycol (SYSTANE BALANCE) 0.6 % SOLN Place 1 drop into both eyes as needed (dry eyes).     rivaroxaban (XARELTO) 20 MG TABS tablet Take 1 tablet (20 mg total) by mouth daily with supper. 14 tablet 0   rosuvastatin (CRESTOR) 20 MG tablet Take 1 tablet (20 mg total) by mouth daily. 90 tablet 1   sertraline (ZOLOFT) 50 MG tablet Take 1 tablet (50 mg total) by mouth daily. 90 tablet 3   traZODone (DESYREL) 50 MG tablet TAKE 1.5 TABLETS BY MOUTH AT BEDTIME. 135 tablet 3   Krill Oil 500 MG CAPS Take 500 mg by mouth daily.     LORazepam (ATIVAN) 0.5 MG tablet Take 1-2 tablets (0.5-1 mg total) by mouth at bedtime. Use as needed for sleep (Patient not taking: Reported on 11/06/2023) 60 tablet 0   No facility-administered medications prior to visit.    Review of Systems  Constitutional:  Negative for chills, diaphoresis, fever, malaise/fatigue and weight loss.  HENT:   Negative for congestion.   Respiratory:  Negative for cough, hemoptysis, sputum production, shortness of breath and wheezing.   Cardiovascular:  Negative for chest pain, palpitations and leg swelling.     Objective:   Vitals:   11/06/23 1057  BP: 124/80  Pulse: (!) 59  SpO2: 94%  Weight: 176 lb 3.2 oz (79.9 kg)  Height: 5\' 1"  (1.549 m)    SpO2: 94 % Physical Exam: General: Well-appearing, no acute distress HENT: [place], AT Eyes: EOMI, no scleral icterus  Respiratory: Clear to auscultation bilaterally.  No crackles, wheezing or rales Cardiovascular: RRR, -M/R/G, no JVD Extremities:-Edema,-tenderness Neuro: AAO x4, CNII-XII grossly intact Psych: Normal mood, normal affect  Data Reviewed:  Imaging: PET 06/07/22 - Hypermetabolic masslike consolidation in LUL, hypermetabolic left supraclavicular, hilar and mediastinal adenopathy. RUL nodule 12 mm new CT Super D 06/26/22 - LUL lung mass with GGO. RUL nodule resolved CT Chest 08/14/22 - Stable LUL focal opacity with surrounding GGO, new reticular opacities in lung bases, stable left supraclavicular, left hilar and prevascular lymph nodes CT Chest 02/10/23 - Slightly decreased LUL with surrounding GGO. Unchanged lower lobe reticulations. Stable mediastinal and hilar lymph nodes. Stable 6mm RML nodule CT Chest 08/13/23 - Stable LUL nodules, small mediastinal lymph nodes  PFT: 08/29/16  FVC 2.63 (95%) FEV1 2.21 (105%) Ratio 84  TLC 91% DLCO 75% Interpretation: Normal spirometry and lung volumes. Mildly reduced DLCO.  10/05/22 FVC 2.37 (92%) FEV1 2.02 (104%) Ratio 85  TLC 87% DLCO 65% Interpretation: Normal spirometry and lung volumes. Compared to prior reduced DLCO, remains mild   Labs:    Latest Ref Rng & Units 09/03/2023    3:25 PM 06/08/2023    3:44 PM 03/08/2023   10:12 AM  CBC  WBC 3.4 - 10.8 x10E3/uL 6.7  8.0  6.9   Hemoglobin 11.1 - 15.9 g/dL 16.1  09.6  04.5   Hematocrit 34.0 - 46.6 % 39.9  41.6  40.0   Platelets 150 - 450  x10E3/uL 253  276    Normal blood counts     Latest Ref Rng & Units 09/03/2023    3:25 PM 06/08/2023    3:44 PM 03/08/2023   10:12 AM  CMP  Glucose 70 - 99 mg/dL 98  99  94   BUN 8 - 27 mg/dL 21  29  19    Creatinine 0.57 - 1.00 mg/dL 4.09  8.11  9.14   Sodium 134 - 144 mmol/L 143  141  142   Potassium 3.5 - 5.2 mmol/L 4.0  4.5  4.4   Chloride 96 - 106 mmol/L 103  99  104   CO2 20 - 29 mmol/L 26  24  25    Calcium 8.7 - 10.3 mg/dL 9.8  78.2  9.6   Total Protein 6.0 - 8.5 g/dL 7.0  7.1  6.8   Total Bilirubin 0.0 - 1.2 mg/dL 0.8  0.9  0.7   Alkaline Phos 44 - 121 IU/L 104  91  89   AST 0 - 40 IU/L 36  28  31   ALT 0 - 32 IU/L 28  23  32   Normal CMET  Pathology: 06/29/22 LUL Needle, endobronchial biopsy- Numerous noncaseating granulomas  Sleep: PSG 03/30/22 - No sleep apnea. Nocturnal hypoxemia  Overnight Oximetry Results Date: 06/26/23 SpO2 <88% 1 hour 34 min 37 sec.  [place] SpO2 74%  Qualified for oxygen     Assessment & Plan:   Discussion: 73 year old female never smoker with pulmonary sarcoid GERD, HTN, HLD, atrial fibrillation who presents for follow-up. Improved cough and mucous production secondary to UACS management.   We discussed the clinical course of sarcoid and management including serial PFTs, labs, eye exam, and EKG and chest imaging if indicated. Addressed questions and concerns. Tolerating methotrexate for active flare however concerned for confusion.  CT Chest 02/10/23 with improved LUL consolidation consistent with sarcoid improvement on methotrexate. Unchanged/slightly improved reticulation in the lower portion of the lungs.   CT Chest 08/13/23 Stable  After discussion of stable imaging and one year of immunosuppression on methotrexate, will began titration plan off methotrexate as noted below. Addressed questions and concerns including medication plan during surgery.  Pulmonary sarcoidosis - active flare on immunosuppressants with improved mediastinal  adenopathy and respiratory symptoms Chronic cough 2/2 UACS - improved. Followed by ENT --Dx in 05/29/22 and 06/15/22 via endobronchial bx --PET/CT 06/07/22 Hypermetabolic masslike consolidation with mediastinal/hilar adenopathy --CT Chest 02/10/23 Improved LUL consolidation --CT 08/13/23 Stable LUL consolidation but improved lymph nodes --Will repeat CT Chest in 05/2024. Expected end date in June. Imaging will reflect 2-3 months off immunosuppresion  History of immunosuppression High risk medication management --Prednisone 30 mg 06/2022-09/2022 . Discontinued d/t agitation, HA. No taper --Methotrexate started Feb 11/04/22 --Methotrexate weaning started 11/06/23 --START reducing by 1 pill every 2 weeks starting 11/11/23 --Continue folic acid 2 mg daily. Ok to reduce to 1 pill daily once you are on 10 mg of methotrexate --If planning for surgery. Please stop methotrexate for one week before surgery and the week of surgery. Restart at prior dosing. --ORDER labs as needed: CBC with diff, CMP today and q3 months.   Sarcoid Monitoring --Recent chest imaging reviewed. Plan for repeat imaging after on stable methotrexate dosing --Annual PFTs.  Last PFTs 09/2022. Mildly reduced DLCO --Annual ophthalmology exam. Last exam 12/26/22 --[facility] at [facility]. --Routine labs as needed: CBC with diff, CMET, 1, 25 and 25 hydroxy vitamin D, urinary calcium  Nocturnal hypoxemia Hx OSA --CONTINUE 2L O2 via [place] nightly  Health Maintenance Immunization History  Administered Date(s) Administered   Fluad Quad(high Dose 65+) 05/02/2019, 06/23/2020, 06/30/2021, 05/29/2022   Influenza, High Dose Seasonal PF 06/14/2016, 05/15/2018   Influenza,inj,Quad PF,6+ Mos 06/11/2014   Influenza,inj,quad, With Preservative 06/11/2017   Influenza-Unspecified 06/12/2023   PFIZER(Purple Top)SARS-COV-2 Vaccination 10/03/2019, 10/24/2019, 06/17/2020, 03/16/2021   Pfizer Covid-19 Vaccine Bivalent Booster 15yrs & up 08/02/2021    Pneumococcal Conjugate-13 08/07/2016   Pneumococcal Polysaccharide-23 01/28/2018   Tdap 01/28/2018   CT Lung Screen - not qualified. Serial imaging as noted above  Orders Placed This Encounter  Procedures   CT Chest Wo Contrast    Standing Status:   Future    Expiration Date:   11/05/2024    Scheduling Instructions:     Schedule in September 2025    Preferred imaging location?:   [facility]   No orders of the defined types were placed in this encounter.   Return in about 7 months (around 06/05/2024) for after CT scan.  I have spent a total time of 40-minutes on the day of the appointment including chart review, data review, collecting history, coordinating care and discussing medical diagnosis and plan with the patient/family. Past medical history, allergies, medications were reviewed. Pertinent imaging, labs and tests included in this note have been reviewed and interpreted independently by me.  Trev Boley Mechele Collin, MD [facility] 11/06/2023 11:05 AM

## 2023-11-08 ENCOUNTER — Ambulatory Visit (HOSPITAL_BASED_OUTPATIENT_CLINIC_OR_DEPARTMENT_OTHER)
Admission: RE | Admit: 2023-11-08 | Discharge: 2023-11-08 | Disposition: A | Discharge status: Home / Self Care | Payer: Medicare Other | Source: Ambulatory Visit | Attending: Family Medicine | Admitting: Family Medicine

## 2023-11-08 ENCOUNTER — Ambulatory Visit: Payer: Medicare Other | Admitting: Nurse Practitioner

## 2023-11-08 ENCOUNTER — Encounter (HOSPITAL_BASED_OUTPATIENT_CLINIC_OR_DEPARTMENT_OTHER): Payer: Self-pay

## 2023-11-08 DIAGNOSIS — R92323 Mammographic fibroglandular density, bilateral breasts: Secondary | ICD-10-CM | POA: Insufficient documentation

## 2023-11-08 DIAGNOSIS — Z139 Encounter for screening, unspecified: Secondary | ICD-10-CM

## 2023-11-08 DIAGNOSIS — Z1231 Encounter for screening mammogram for malignant neoplasm of breast: Secondary | ICD-10-CM | POA: Diagnosis not present

## 2023-11-12 ENCOUNTER — Encounter: Payer: Self-pay | Admitting: Family Medicine

## 2023-11-12 MED ORDER — BENZONATATE 200 MG PO CAPS
200.0000 mg | ORAL_CAPSULE | Freq: Three times a day (TID) | ORAL | 2 refills | Status: AC | PRN
Start: 2023-11-12 — End: ?

## 2023-11-13 DIAGNOSIS — F331 Major depressive disorder, recurrent, moderate: Secondary | ICD-10-CM | POA: Diagnosis not present

## 2023-11-26 ENCOUNTER — Encounter: Payer: Self-pay | Admitting: Family Medicine

## 2023-11-26 ENCOUNTER — Encounter: Payer: Self-pay | Admitting: Nurse Practitioner

## 2023-12-03 DIAGNOSIS — L570 Actinic keratosis: Secondary | ICD-10-CM | POA: Diagnosis not present

## 2023-12-03 DIAGNOSIS — L820 Inflamed seborrheic keratosis: Secondary | ICD-10-CM | POA: Diagnosis not present

## 2023-12-04 DIAGNOSIS — F331 Major depressive disorder, recurrent, moderate: Secondary | ICD-10-CM | POA: Diagnosis not present

## 2023-12-10 ENCOUNTER — Other Ambulatory Visit: Payer: Self-pay | Admitting: Family Medicine

## 2023-12-10 ENCOUNTER — Encounter: Payer: Self-pay | Admitting: Cardiology

## 2023-12-10 ENCOUNTER — Telehealth: Payer: Self-pay | Admitting: Orthopaedic Surgery

## 2023-12-10 DIAGNOSIS — I480 Paroxysmal atrial fibrillation: Secondary | ICD-10-CM

## 2023-12-10 NOTE — Telephone Encounter (Signed)
 Pt is wanting to move forward with knee replacement. Please submit insurance and call when when approved. Noticed pt of Dr Magnus Ivan instructions for medication and cardiology clearance of last office note. Pt phone number is [phone].

## 2023-12-14 ENCOUNTER — Encounter (HOSPITAL_BASED_OUTPATIENT_CLINIC_OR_DEPARTMENT_OTHER): Payer: Self-pay | Admitting: Pulmonary Disease

## 2023-12-14 NOTE — Telephone Encounter (Signed)
**Note De-identified  [place] Obfuscation** Please advise 

## 2023-12-14 NOTE — Telephone Encounter (Signed)
I called patient yesterday and scheduled surgery.

## 2023-12-20 ENCOUNTER — Encounter: Payer: Self-pay | Admitting: Family Medicine

## 2023-12-20 ENCOUNTER — Other Ambulatory Visit: Payer: Self-pay | Admitting: Family Medicine

## 2023-12-20 DIAGNOSIS — M810 Age-related osteoporosis without current pathological fracture: Secondary | ICD-10-CM

## 2023-12-20 DIAGNOSIS — I480 Paroxysmal atrial fibrillation: Secondary | ICD-10-CM

## 2023-12-20 MED ORDER — ALENDRONATE SODIUM 70 MG PO TABS
70.0000 mg | ORAL_TABLET | ORAL | 3 refills | Status: AC
Start: 2023-12-20 — End: ?

## 2023-12-24 DIAGNOSIS — F331 Major depressive disorder, recurrent, moderate: Secondary | ICD-10-CM | POA: Diagnosis not present

## 2023-12-25 ENCOUNTER — Encounter: Payer: Self-pay | Admitting: Cardiology

## 2023-12-25 ENCOUNTER — Encounter: Payer: Self-pay | Admitting: Orthopaedic Surgery

## 2023-12-25 ENCOUNTER — Telehealth: Payer: Self-pay

## 2023-12-25 NOTE — Telephone Encounter (Deleted)
 Copied from CRM [id number]. Topic: Clinical - Medical Advice >> Dec 24, 2023  9:43 AM Hilton Lucky wrote: Reason for CRM: Patient is calling in to state that she has an upcoming knee surgery two weeks from present. Daughter of patient is concerned her ongoing pulmonary dx is going to pose a high risk to the surgery. Patient seeking advisement for peace of mind on what realistic expectations are prior to surgery.

## 2023-12-25 NOTE — Telephone Encounter (Signed)
 Copied from CRM [id number]. Topic: Clinical - Medical Advice >> Dec 24, 2023  9:43 AM Hilton Lucky wrote: Reason for CRM: Patient is calling in to state that she has an upcoming knee surgery two weeks from present. Daughter of patient is concerned her ongoing pulmonary dx is going to pose a high risk to the surgery. Patient seeking advisement for peace of mind on what realistic expectations are prior to surgery.  ATC x1 Merritt Ables regarding patient LVM to call our office back .

## 2023-12-26 ENCOUNTER — Telehealth: Payer: Self-pay | Admitting: *Deleted

## 2023-12-26 NOTE — Telephone Encounter (Signed)
 Pharmacy please advise on holding Xarelto prior to right total knee scheduled for 01/15/2024. Thank you.

## 2023-12-26 NOTE — Telephone Encounter (Signed)
   Pre-operative Risk Assessment    Patient Name: Lisa Acosta  DOB: [birth year] MRN: [id number]   Date of last office visit: 05/18/23 DR. CRENSHAW Date of next office visit: NONE   Request for Surgical Clearance    Procedure:   RIGHT TOTAL KNEE ARTHROPLASTY  Date of Surgery:  Clearance 01/15/24                                Surgeon:  DR. Norberto Bear Surgeon's Group or Practice Name:  [facility] Phone number:  [phone] Fax number:  [phone] ATTN: SHERRIE   Type of Clearance Requested:   - Medical  - Pharmacy:  Hold Rivaroxaban (Xarelto) x 3 DAYS PRIOR   Type of Anesthesia:  Spinal & BLOCK   Additional requests/questions:    [place] Broom   12/26/2023, 5:14 PM

## 2023-12-27 ENCOUNTER — Telehealth: Payer: Self-pay | Admitting: *Deleted

## 2023-12-27 NOTE — Telephone Encounter (Signed)
 Pt has been scheduled tele preop appt 01/02/24. Med rec and consent are done.

## 2023-12-27 NOTE — Telephone Encounter (Signed)
   Name: Lisa Acosta  DOB: 1951/05/25  MRN: [id number]  Primary Cardiologist: Alexandria Angel, MD   Preoperative team, please contact this patient and set up a phone call appointment for further preoperative risk assessment. Please obtain consent and complete medication review. Thank you for your help.  I confirm that guidance regarding antiplatelet and oral anticoagulation therapy has been completed and, if necessary, noted below.  Per office protocol, patient can hold Xarelto for 3 days prior to procedure.   Patient will not need bridging with Lovenox (enoxaparin) around procedure.    I also confirmed the patient resides in the state of [place] . As per [facility] Medical Board telemedicine laws, the patient must reside in the state in which the provider is licensed.   Francene Ing, Retha Cast, NP 12/27/2023, 12:22 PM [facility]

## 2023-12-27 NOTE — Telephone Encounter (Signed)
 Patient with diagnosis of atrial fibrillation on Xarelto for anticoagulation.    Procedure:   RIGHT TOTAL KNEE ARTHROPLASTY   Date of Surgery:  Clearance 01/15/24     CHA2DS2-VASc Score = 3   This indicates a 3.2% annual risk of stroke. The patient's score is based upon: CHF History: 0 HTN History: 1 Diabetes History: 0 Stroke History: 0 Vascular Disease History: 0 Age Score: 1 Gender Score: 1    CrCl 91 Platelet count 253  Per office protocol, patient can hold Xarelto for 3 days prior to procedure.   Patient will not need bridging with Lovenox (enoxaparin) around procedure.  **This guidance is not considered finalized until pre-operative APP has relayed final recommendations.**

## 2023-12-27 NOTE — Telephone Encounter (Signed)
 Pt has been scheduled tele preop appt 01/02/24. Med rec and consent are done.     Patient Consent for Virtual Visit        Lisa Acosta has provided verbal consent on 12/27/2023 for a virtual visit (video or telephone).   CONSENT FOR VIRTUAL VISIT FOR:  Lisa Acosta  By participating in this virtual visit I agree to the following:  I hereby voluntarily request, consent and authorize [facility] and its employed or contracted physicians, physician assistants, nurse practitioners or other licensed health care professionals (the Practitioner), to provide me with telemedicine health care services (the "Services") as deemed necessary by the treating Practitioner. I acknowledge and consent to receive the Services by the Practitioner via telemedicine. I understand that the telemedicine visit will involve communicating with the Practitioner through live audiovisual communication technology and the disclosure of certain medical information by electronic transmission. I acknowledge that I have been given the opportunity to request an in-person assessment or other available alternative prior to the telemedicine visit and am voluntarily participating in the telemedicine visit.  I understand that I have the right to withhold or withdraw my consent to the use of telemedicine in the course of my care at any time, without affecting my right to future care or treatment, and that the Practitioner or I may terminate the telemedicine visit at any time. I understand that I have the right to inspect all information obtained and/or recorded in the course of the telemedicine visit and may receive copies of available information for a reasonable fee.  I understand that some of the potential risks of receiving the Services via telemedicine include:  Delay or interruption in medical evaluation due to technological equipment failure or disruption; Information transmitted may not be sufficient (e.g. poor  resolution of images) to allow for appropriate medical decision making by the Practitioner; and/or  In rare instances, security protocols could fail, causing a breach of personal health information.  Furthermore, I acknowledge that it is my responsibility to provide information about my medical history, conditions and care that is complete and accurate to the best of my ability. I acknowledge that Practitioner's advice, recommendations, and/or decision may be based on factors not within their control, such as incomplete or inaccurate data provided by me or distortions of diagnostic images or specimens that may result from electronic transmissions. I understand that the practice of medicine is not an exact science and that Practitioner makes no warranties or guarantees regarding treatment outcomes. I acknowledge that a copy of this consent can be made available to me via my patient portal [facility] MyChart), or I can request a printed copy by calling the office of  [facility].    I understand that my insurance will be billed for this visit.   I have read or had this consent read to me. I understand the contents of this consent, which adequately explains the benefits and risks of the Services being provided via telemedicine.  I have been provided ample opportunity to ask questions regarding this consent and the Services and have had my questions answered to my satisfaction. I give my informed consent for the services to be provided through the use of telemedicine in my medical care

## 2023-12-27 NOTE — Telephone Encounter (Signed)
Pt returning nurse call. Please advise.

## 2023-12-27 NOTE — Telephone Encounter (Signed)
 I called the pt to schedule tele preop appt, but we were disconnected. I called back and got her vm to call back.

## 2023-12-31 ENCOUNTER — Encounter: Payer: Self-pay | Admitting: Nurse Practitioner

## 2023-12-31 ENCOUNTER — Ambulatory Visit (INDEPENDENT_AMBULATORY_CARE_PROVIDER_SITE_OTHER): Admitting: Nurse Practitioner

## 2023-12-31 VITALS — BP 137/76 | HR 54 | Temp 98.8°F | Ht 61.0 in | Wt 175.0 lb

## 2023-12-31 DIAGNOSIS — R632 Polyphagia: Secondary | ICD-10-CM | POA: Diagnosis not present

## 2023-12-31 DIAGNOSIS — Z6833 Body mass index (BMI) 33.0-33.9, adult: Secondary | ICD-10-CM

## 2023-12-31 DIAGNOSIS — E66811 Obesity, class 1: Secondary | ICD-10-CM

## 2023-12-31 NOTE — Progress Notes (Signed)
 Office: [phone]  /  Fax: [phone]  WEIGHT SUMMARY AND BIOMETRICS  Weight Lost Since Last Visit: 0lb  Weight Gained Since Last Visit: 6lb   Vitals Temp: 98.8 F (37.1 C) BP: 137/76 Pulse Rate: (!) 54 SpO2: 99 %   Anthropometric Measurements Height: 5\' 1"  (1.549 m) Weight: 175 lb (79.4 kg) BMI (Calculated): 33.08 Weight at Last Visit: 169lb Weight Lost Since Last Visit: 0lb Weight Gained Since Last Visit: 6lb Starting Weight: 168lb Total Weight Loss (lbs): 0 lb (0 kg)   Body Composition  Body Fat %: 47.2 % Fat Mass (lbs): 82.8 lbs Muscle Mass (lbs): 88 lbs Total Body Water (lbs): 65.8 lbs Visceral Fat Rating : 14   Other Clinical Data Fasting: No Labs: No Today's Visit #: 26 Starting Date: 01/05/21     HPI  Chief Complaint: OBESITY  Lisa Acosta is here to discuss her progress with her obesity treatment plan. She is on the practicing portion control and making smarter food choices, such as increasing vegetables and decreasing simple carbohydrates and states she is following her eating plan approximately 0 % of the time. She states she is exercising 0 minutes 0 days per week.   Interval History:  Since last office visit she has gained 6 pounds.  She has been overeating and stress eating.  She has been trying to eat more protein. She is struggling with polyphagia and cravings.  She is drinking water and fairlife milk.    Her husband had surgery for lung cancer and is having complications with MS after surgery.    She is scheduled for a right total knee replacement on May 6th. She is hoping to stay in rehab after surgery since her husband can't currently help her after surgery.   Pharmacotherapy for weight loss: She is not currently taking medications  for medical weight loss.     PHYSICAL EXAM:  Blood pressure 137/76, pulse (!) 54, temperature 98.8 F (37.1 C), height 5\' 1"  (1.549 m), weight 175 lb (79.4 kg), SpO2 99%. Body mass index is 33.07  kg/m.  General: She is overweight, cooperative, alert, well developed, and in no acute distress. PSYCH: Has normal mood, affect and thought process.   Extremities: No edema.  Neurologic: No gross sensory or motor deficits. No tremors or fasciculations noted.    DIAGNOSTIC DATA REVIEWED:  BMET    Component Value Date/Time   NA 143 09/03/2023 1525   K 4.0 09/03/2023 1525   CL 103 09/03/2023 1525   CO2 26 09/03/2023 1525   GLUCOSE 98 09/03/2023 1525   GLUCOSE 120 (H) 06/15/2022 0935   BUN 21 09/03/2023 1525   CREATININE 0.69 09/03/2023 1525   CALCIUM  9.8 09/03/2023 1525   GFRNONAA >60 06/15/2022 0935   GFRAA >60 09/24/2019 1224   Lab Results  Component Value Date   HGBA1C 5.7 01/10/2023   HGBA1C 6.0 01/22/2017   Lab Results  Component Value Date   INSULIN  20.4 01/05/2021   Lab Results  Component Value Date   TSH 1.13 01/10/2023   CBC    Component Value Date/Time   WBC 6.7 09/03/2023 1525   WBC 6.7 06/15/2022 0935   RBC 4.37 09/03/2023 1525   RBC 4.43 06/15/2022 0935   HGB 12.9 09/03/2023 1525   HCT 39.9 09/03/2023 1525   PLT 253 09/03/2023 1525   MCV 91 09/03/2023 1525   MCH 29.5 09/03/2023 1525   MCH 29.6 06/15/2022 0935   MCHC 32.3 09/03/2023 1525   MCHC 32.7 06/15/2022 0935  RDW 12.4 09/03/2023 1525   Iron Studies No results found for: "IRON", "TIBC", "FERRITIN", "IRONPCTSAT" Lipid Panel     Component Value Date/Time   CHOL 151 01/10/2023 1430   CHOL 186 01/05/2021 1040   TRIG 74.0 01/10/2023 1430   HDL 66.70 01/10/2023 1430   HDL 67 01/05/2021 1040   CHOLHDL 2 01/10/2023 1430   VLDL 14.8 01/10/2023 1430   LDLCALC 70 01/10/2023 1430   LDLCALC 95 01/05/2021 1040   LDLCALC 103 (H) 06/23/2020 1148   LDLDIRECT 112.0 01/22/2017 1559   Hepatic Function Panel     Component Value Date/Time   PROT 7.0 09/03/2023 1525   ALBUMIN 4.2 09/03/2023 1525   AST 36 09/03/2023 1525   ALT 28 09/03/2023 1525   ALKPHOS 104 09/03/2023 1525   BILITOT 0.8  09/03/2023 1525   BILIDIR 0.1 06/23/2020 1148   BILIDIR 0.24 04/30/2020 1048   IBILI 0.7 06/23/2020 1148      Component Value Date/Time   TSH 1.13 01/10/2023 1430   Nutritional Lab Results  Component Value Date   VD25OH 79.10 11/16/2021   VD25OH 25.0 (L) 01/05/2021     ASSESSMENT AND PLAN  TREATMENT PLAN FOR OBESITY:  Recommended Dietary Goals  Lisa Acosta is currently in the action stage of change. As such, her goal is to continue weight management plan. She has agreed to practicing portion control and making smarter food choices, such as increasing vegetables and decreasing simple carbohydrates.    Behavioral Intervention  We discussed the following Behavioral Modification Strategies today: increasing lean protein intake to established goals, decreasing simple carbohydrates , increasing vegetables, increasing water intake , work on meal planning and preparation, and continue to work on maintaining a reduced calorie state, getting the recommended amount of protein, incorporating whole foods, making healthy choices, staying well hydrated and practicing mindfulness when eating..  Additional resources provided today: NA  Recommended Physical Activity Goals  Lisa Acosta has been advised to exercise based upon PT and surgeon's recommendations.      ASSOCIATED CONDITIONS ADDRESSED TODAY  Action/Plan  Polyphagia Intensive lifestyle modifications are the first line treatment for this issue. We discussed several lifestyle modifications today and she will continue to work on diet, exercise and weight loss efforts. Orders and follow up as documented in patient record.  Counseling Polyphagia is excessive hunger. Causes can include: low blood sugars, hypERthyroidism, PMS, lack of sleep, stress, insulin  resistance, diabetes, certain medications, and diets that are deficient in protein and fiber.    Class 1 obesity due to excess calories with serious comorbidity and body mass index (BMI) of  33.0 to 33.9 in adult      Will obtain labs at next visit if not obtained at PCP's office  Patient to contact the office after her surgery to schedule a follow-up appointment.  Return if symptoms worsen or fail to improve.Lisa Acosta She was informed of the importance of frequent follow up visits to maximize her success with intensive lifestyle modifications for her multiple health conditions.   ATTESTASTION STATEMENTS:  Reviewed by clinician on day of visit: allergies, medications, problem list, medical history, surgical history, family history, social history, and previous encounter notes.   Time spent on visit including pre-visit chart review and post-visit care and charting was 30 minutes.    Crist Dominion. Meelah Tallo FNP-C

## 2024-01-01 DIAGNOSIS — R7303 Prediabetes: Secondary | ICD-10-CM | POA: Diagnosis not present

## 2024-01-01 DIAGNOSIS — H26493 Other secondary cataract, bilateral: Secondary | ICD-10-CM | POA: Diagnosis not present

## 2024-01-01 DIAGNOSIS — Z961 Presence of intraocular lens: Secondary | ICD-10-CM | POA: Diagnosis not present

## 2024-01-01 DIAGNOSIS — H52203 Unspecified astigmatism, bilateral: Secondary | ICD-10-CM | POA: Diagnosis not present

## 2024-01-01 DIAGNOSIS — H04123 Dry eye syndrome of bilateral lacrimal glands: Secondary | ICD-10-CM | POA: Diagnosis not present

## 2024-01-01 DIAGNOSIS — H524 Presbyopia: Secondary | ICD-10-CM | POA: Diagnosis not present

## 2024-01-01 DIAGNOSIS — H348112 Central retinal vein occlusion, right eye, stable: Secondary | ICD-10-CM | POA: Diagnosis not present

## 2024-01-02 ENCOUNTER — Ambulatory Visit: Attending: Cardiovascular Disease | Admitting: Nurse Practitioner

## 2024-01-02 ENCOUNTER — Telehealth: Payer: Self-pay | Admitting: Pulmonary Disease

## 2024-01-02 DIAGNOSIS — Z01810 Encounter for preprocedural cardiovascular examination: Secondary | ICD-10-CM | POA: Diagnosis not present

## 2024-01-02 NOTE — Telephone Encounter (Signed)
 No need to be seen. Risk assessment : LOW  Patient medication recommendation --If planning for surgery. Please stop methotrexate  for one week before surgery and the week of surgery. Restart at prior dosing that you were at before the surgery and continue to taper as discussed (reducing by 1 pill every 2 weeks). --Xarelto  per Cardiology  Peri-operative Assessment of Pulmonary Risk for Non-Thoracic Surgery:  For [name], risk of perioperative pulmonary complications is increased by:  [X]  Age greater than 65 years  [ ] COPD  [ ] Serum albumin <3.5  [ ] Smoking  [ ] Obstructive sleep apnea  [ ] NYHA Class II Pulmonary Hypertension  ARISCAT: Low risk 1.6% risk of in-hospital post-op pulmonary complications (composite including respiratory failure, respiratory infection, pleural effusion, atelectasis, pneumothorax, bronchospasm, aspiration pneumonitis)  Respiratory complications generally occur in 1% of ASA Class I patients, 5% of ASA Class II and 10% of ASA Class III-IV patients These complications rarely result in mortality and include postoperative pneumonia, atelectasis, pulmonary embolism, ARDS and increased time requiring postoperative mechanical ventilation.  Overall, I recommend proceeding with the surgery if the risk for respiratory complications are outweighed by the potential benefits. This will need to be discussed between the patient and surgeon.  To reduce risks of respiratory complications, I recommend: --Pre- and post-operative incentive spirometry performed frequently while awake --Avoiding/minimizing use of paralytics if possible during anesthesia.   Lisa Acosta, M.D. [facility] Pulmonary/Critical Care Medicine 01/02/2024 12:07 PM   See Amion for personal pager For hours between 7 PM to 7 AM, please call Elink for urgent questions

## 2024-01-02 NOTE — Telephone Encounter (Signed)
 Fax received from [facility] to perform a major orthopedic surgery on patient under spinal and interscalene block.  Patient needs surgery clearance. Surgery is 01/15/24. Patient was seen on 11/06/23. Office protocol is a risk assessment can be sent to surgeon if patient has been seen in 60 days or less.   Sending to Dr Washington Hacker for risk assessment or recommendations if patient needs to be seen in office prior to surgical procedure.

## 2024-01-02 NOTE — Progress Notes (Signed)
 Virtual Visit via Telephone Note   Because of Lisa Acosta co-morbid illnesses, she is at least at moderate risk for complications without adequate follow up.  This format is felt to be most appropriate for this patient at this time.  Due to technical limitations with video connection (technology), today's appointment will be conducted as an audio only telehealth visit, and Lisa Acosta verbally agreed to proceed in this manner.   All issues noted in this document were discussed and addressed.  No physical exam could be performed with this format.  Evaluation Performed:  Preoperative cardiovascular risk assessment _____________   Date:  01/02/2024   Patient ID:  Lisa Acosta, DOB 02-23-1951, MRN [id number] Patient Location:  Home Provider location:   Office  Primary Care Provider:  Kaylee Partridge, MD Primary Cardiologist:  Alexandria Angel, MD  Chief Complaint / Patient Profile   73 y.o. y/o female with a h/o paroxysmal atrial fibrillation, sarcoidosis, hypertension, and hyperlipidemia who is pending right total knee arthroplasty on 01/15/2024 with Dr. Norberto Bear of Ortho care at [facility] and presents today for telephonic preoperative cardiovascular risk assessment.  History of Present Illness    Lisa Acosta is a 73 y.o. female who presents via audio/video conferencing for a telehealth visit today.  Pt was last seen in cardiology clinic on 05/18/2023 by Dr. Audery Blazing.  At that time Lisa Acosta was doing well.  The patient is now pending procedure as outlined above. Since her last visit, she has done well from a cardiac standpoint.   She denies chest pain, palpitations, dyspnea, pnd, orthopnea, n, v, dizziness, syncope, edema, weight gain, or early satiety. All other systems reviewed and are otherwise negative except as noted above.   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   Pre-diabetes    Past Surgical History:  Procedure Laterality Date   BREAST BIOPSY Left    needle core biopsy, benign   BRONCHIAL BIOPSY  06/15/2022   Procedure: BRONCHIAL BIOPSIES;  Surgeon: Aleck Hurdle, MD;  Location: [facility] ENDOSCOPY;  Service: Pulmonary;;   BRONCHIAL BIOPSY  06/29/2022   Procedure: BRONCHIAL BIOPSIES;  Surgeon: Prudy Brownie, DO;  Location: [facility] ENDOSCOPY;  Service: Pulmonary;;   BRONCHIAL NEEDLE ASPIRATION BIOPSY  06/15/2022   Procedure: BRONCHIAL NEEDLE ASPIRATION BIOPSIES;  Surgeon: Aleck Hurdle, MD;  Location: [facility] ENDOSCOPY;  Service: Pulmonary;;   BRONCHIAL NEEDLE ASPIRATION BIOPSY  06/29/2022   Procedure: BRONCHIAL NEEDLE ASPIRATION BIOPSIES;  Surgeon: Prudy Brownie, DO;  Location: [facility] ENDOSCOPY;  Service: Pulmonary;;   BRONCHIAL WASHINGS  06/15/2022   Procedure: BRONCHIAL WASHINGS;  Surgeon: Aleck Hurdle, MD;  Location: [facility] ENDOSCOPY;  Service: Pulmonary;;   CATARACT EXTRACTION     DILATION AND CURETTAGE OF UTERUS     EYE SURGERY  HAND SURGERY     KNEE SURGERY     ROTATOR CUFF REPAIR     TOTAL ABDOMINAL HYSTERECTOMY     VIDEO BRONCHOSCOPY N/A 06/15/2022   Procedure: VIDEO BRONCHOSCOPY WITH FLUORO;  Surgeon: Aleck Hurdle, MD;  Location: [facility] ENDOSCOPY;  Service: Pulmonary;  Laterality: N/A;   VIDEO BRONCHOSCOPY WITH ENDOBRONCHIAL ULTRASOUND  06/15/2022   Procedure: VIDEO BRONCHOSCOPY WITH ENDOBRONCHIAL ULTRASOUND;  Surgeon: Aleck Hurdle, MD;   Location: [facility] ENDOSCOPY;  Service: Pulmonary;;   VIDEO BRONCHOSCOPY WITH ENDOBRONCHIAL ULTRASOUND N/A 06/29/2022   Procedure: VIDEO BRONCHOSCOPY WITH ENDOBRONCHIAL ULTRASOUND;  Surgeon: Prudy Brownie, DO;  Location: [facility] ENDOSCOPY;  Service: Pulmonary;  Laterality: N/A;    Allergies  Allergies  Allergen Reactions   Dextran Anaphylaxis   Tree Extract    Penicillin G Rash   Penicillins Rash   Sulfa Antibiotics Rash and Other (See Comments)   Wound Dressing Adhesive Rash    Home Medications    Prior to Admission medications   Medication Sig Start Date End Date Taking? Authorizing Provider  alendronate  (FOSAMAX ) 70 MG tablet Take 1 tablet (70 mg total) by mouth every 7 (seven) days. Take with a full glass of water on an empty stomach. 12/20/23   Copland, Skipper Dumas, MD  amLODipine  (NORVASC ) 5 MG tablet Take 1 tablet (5 mg total) by mouth daily. 09/21/23   Copland, Skipper Dumas, MD  atenolol  (TENORMIN ) 50 MG tablet TAKE 1 TABLET BY MOUTH EVERY DAY 10/01/23   Copland, Skipper Dumas, MD  benzonatate  (TESSALON ) 200 MG capsule Take 1 capsule (200 mg total) by mouth 3 (three) times daily as needed for cough. Patient not taking: Reported on 12/31/2023 11/12/23   Copland, Skipper Dumas, MD  Calcium  Carb-Cholecalciferol (CALCIUM  600 + D PO) Take 1 tablet by mouth in the morning and at bedtime.    [provider]  cetirizine  (ZYRTEC ) 10 MG tablet Take 1 tablet (10 mg total) by mouth daily. 10/01/23   Soldatova, Liuba, MD  flecainide  (TAMBOCOR ) 100 MG tablet TAKE 1 TABLET BY MOUTH 2 TIMES A DAY 12/10/23   Copland, Skipper Dumas, MD  fluticasone  (FLONASE ) 50 MCG/ACT nasal spray Place 2 sprays into both nostrils 2 (two) times daily. 10/01/23   Soldatova, Liuba, MD  folic acid  (FOLVITE ) 1 MG tablet Take 2 tablets (2 mg total) by mouth daily. 09/03/23   Quillian Brunt, MD  guaiFENesin  (MUCINEX ) 600 MG 12 hr tablet Take 2 tablets (1,200 mg total) by mouth 2 (two) times daily. Patient not taking: Reported on 12/31/2023  04/19/23   Everlina Hock, NP  Oksana Bergamo Oil 500 MG CAPS Take 500 mg by mouth daily.    [provider]  levothyroxine  (SYNTHROID ) 125 MCG tablet Take 1 tablet (125 mcg total) by mouth daily before breakfast. 10/31/23   Copland, Skipper Dumas, MD  LORazepam  (ATIVAN ) 0.5 MG tablet Take 1-2 tablets (0.5-1 mg total) by mouth at bedtime. Use as needed for sleep 09/14/23   Copland, Jessica C, MD  magnesium oxide (MAG-OX) 400 (240 Mg) MG tablet Take 400 mg by mouth daily.    [provider]  methotrexate  (RHEUMATREX) 2.5 MG tablet Take 8 tablets (20 mg total) by mouth once a week. Caution:Chemotherapy. Protect from light. 09/03/23   Quillian Brunt, MD  Multiple Vitamins-Minerals (PRESERVISION AREDS 2+MULTI VIT PO) Take 1 capsule by mouth daily.    [provider]  omeprazole  (PRILOSEC) 40 MG capsule Take 1 capsule (40 mg total) by mouth in the morning and  at bedtime. 01/22/23   Copland, Skipper Dumas, MD  ondansetron  (ZOFRAN ) 8 MG tablet Take 1 tablet (8 mg total) by mouth every 8 (eight) hours as needed for nausea or vomiting. 04/19/23   Everlina Hock, NP  Propylene Glycol (SYSTANE BALANCE) 0.6 % SOLN Place 1 drop into both eyes as needed (dry eyes).    [provider]  rivaroxaban  (XARELTO ) 20 MG TABS tablet TAKE 1 TABLET EVERY DAY WITH SUPPER 12/20/23   Copland, Jessica C, MD  rosuvastatin  (CRESTOR ) 20 MG tablet Take 1 tablet (20 mg total) by mouth daily. 10/31/23   Copland, Skipper Dumas, MD  sertraline  (ZOLOFT ) 50 MG tablet Take 1 tablet (50 mg total) by mouth daily. 08/30/23   Copland, Skipper Dumas, MD  traZODone  (DESYREL ) 50 MG tablet TAKE 1.5 TABLETS BY MOUTH AT BEDTIME. 10/30/22   Copland, Skipper Dumas, MD    Physical Exam    Vital Signs:  Niko Camie Hauss does not have vital signs available for review today.  Given telephonic nature of communication, physical exam is limited. AAOx3. NAD. Normal affect.  Speech and respirations are unlabored.  Accessory Clinical Findings     None  Assessment & Plan    1.  Preoperative Cardiovascular Risk Assessment:  According to the Revised Cardiac Risk Index (RCRI), her Perioperative Risk of Major Cardiac Event is (%): 0.4. Her Functional Capacity in METs is: 5.72 according to the Duke Activity Status Index (DASI).Therefore, based on ACC/AHA guidelines, patient would be at acceptable risk for the planned procedure without further cardiovascular testing.   The patient was advised that if she develops new symptoms prior to surgery to contact our office to arrange for a follow-up visit, and she verbalized understanding.  Per office protocol, patient can hold Xarelto  for 3 days prior to procedure. Patient will not need bridging with Lovenox (enoxaparin) around procedure. Please resume Xarelto  as soon as possible postprocedure, at the discretion of the surgeon.    A copy of this note will be routed to requesting surgeon.  Time:   Today, I have spent 5 minutes with the patient with telehealth technology discussing medical history, symptoms, and management plan.     Jude Norton, NP  01/02/2024, 4:33 PM

## 2024-01-02 NOTE — Telephone Encounter (Signed)
 Copy of this encounter was faxed to [facility] and I have received fax confirmation.

## 2024-01-04 NOTE — Pre-Procedure Instructions (Signed)
 Surgical Instructions   Your procedure is scheduled on Jan 15, 2024. Report to [facility] Main Entrance "A" at 9:30 A.M., then check in with the Admitting office. Any questions or running late day of surgery: call [phone]  Questions prior to your surgery date: call [phone], Monday-Friday, 8am-4pm. If you experience any cold or flu symptoms such as cough, fever, chills, shortness of breath, etc. between now and your scheduled surgery, please notify us  at the above number.     Remember:  Do not eat after midnight the night before your surgery  You may drink clear liquids until 8:30 AM the morning of your surgery.   Clear liquids allowed are: Water, Non-Citrus Juices (without pulp), Carbonated Beverages, Clear Tea (no milk, honey, etc.), Black Coffee Only (NO MILK, CREAM OR POWDERED CREAMER of any kind), and Gatorade.  Patient Instructions  The night before surgery:  No food after midnight. ONLY clear liquids after midnight  The day of surgery (if you do NOT have diabetes):  Drink ONE (1) Pre-Surgery Clear Ensure by 8:30 AM the morning of surgery. Drink in one sitting. Do not sip.  This drink was given to you during your hospital  pre-op appointment visit.  Nothing else to drink after completing the  Pre-Surgery Clear Ensure.         If you have questions, please contact your surgeon's office.    Take these medicines the morning of surgery with A SIP OF WATER: amLODipine  (NORVASC )  atenolol  (TENORMIN )  cetirizine  (ZYRTEC )  flecainide  (TAMBOCOR )  fluticasone  (FLONASE ) nasal spray  levothyroxine  (SYNTHROID )  omeprazole  (PRILOSEC)  rosuvastatin  (CRESTOR )  sertraline  (ZOLOFT )    May take these medicines IF NEEDED: ondansetron  (ZOFRAN )  Propylene Glycol (SYSTANE BALANCE) eye drops   Follow your surgeon's instructions on when to stop rivaroxaban  (XARELTO ).  If no instructions were given by your surgeon then you will need to call the office to get those instructions.     Please follow your prescribing physician's instructions on if/when to stop taking methotrexate  (RHEUMATREX).   One week prior to surgery, STOP taking any Aspirin (unless otherwise instructed by your surgeon) Aleve, Naproxen, Ibuprofen, Motrin, Advil, Goody's, BC's, all herbal medications, fish oil, and non-prescription vitamins.                     Do NOT Smoke (Tobacco/Vaping) for 24 hours prior to your procedure.  If you use a CPAP at night, you may bring your mask/headgear for your overnight stay.   You will be asked to remove any contacts, glasses, piercing's, hearing aid's, dentures/partials prior to surgery. Please bring cases for these items if needed.    Patients discharged the day of surgery will not be allowed to drive home, and someone needs to stay with them for 24 hours.  SURGICAL WAITING ROOM VISITATION Patients may have no more than 2 support people in the waiting area - these visitors may rotate.   Pre-op nurse will coordinate an appropriate time for 1 ADULT support person, who may not rotate, to accompany patient in pre-op.  Children under the age of 26 must have an adult with them who is not the patient and must remain in the main waiting area with an adult.  If the patient needs to stay at the hospital during part of their recovery, the visitor guidelines for inpatient rooms apply.  Please refer to the [facility] website for the visitor guidelines for any additional information.   If you received a COVID test  during your pre-op visit  it is requested that you wear a mask when out in public, stay away from anyone that may not be feeling well and notify your surgeon if you develop symptoms. If you have been in contact with anyone that has tested positive in the last 10 days please notify you surgeon.      Pre-operative 5 CHG Bathing Instructions   You can play a key role in reducing the risk of infection after surgery. Your skin needs to be as free of germs as  possible. You can reduce the number of germs on your skin by washing with CHG (chlorhexidine  gluconate) soap before surgery. CHG is an antiseptic soap that kills germs and continues to kill germs even after washing.   DO NOT use if you have an allergy to chlorhexidine /CHG or antibacterial soaps. If your skin becomes reddened or irritated, stop using the CHG and notify one of our RNs at [phone].   Please shower with the CHG soap starting 4 days before surgery using the following schedule:     Please keep in mind the following:  DO NOT shave, including legs and underarms, starting the day of your first shower.   You may shave your face at any point before/day of surgery.  Place clean sheets on your bed the day you start using CHG soap. Use a clean washcloth (not used since being washed) for each shower. DO NOT sleep with pets once you start using the CHG.   CHG Shower Instructions:  Wash your face and private area with normal soap. If you choose to wash your hair, wash first with your normal shampoo.  After you use shampoo/soap, rinse your hair and body thoroughly to remove shampoo/soap residue.  Turn the water OFF and apply about 3 tablespoons (45 ml) of CHG soap to a CLEAN washcloth.  Apply CHG soap ONLY FROM YOUR NECK DOWN TO YOUR TOES (washing for 3-5 minutes)  DO NOT use CHG soap on face, private areas, open wounds, or sores.  Pay special attention to the area where your surgery is being performed.  If you are having back surgery, having someone wash your back for you may be helpful. Wait 2 minutes after CHG soap is applied, then you may rinse off the CHG soap.  Pat dry with a clean towel  Put on clean clothes/pajamas   If you choose to wear lotion, please use ONLY the CHG-compatible lotions that are listed below.  Additional instructions for the day of surgery: DO NOT APPLY any lotions, deodorants, cologne, or perfumes.   Do not bring valuables to the hospital. [facility] is  not responsible for any belongings/valuables. Do not wear nail polish, gel polish, artificial nails, or any other type of covering on natural nails (fingers and toes) Do not wear jewelry or makeup Put on clean/comfortable clothes.  Please brush your teeth.  Ask your nurse before applying any prescription medications to the skin.     CHG Compatible Lotions   Aveeno Moisturizing lotion  Cetaphil Moisturizing Cream  Cetaphil Moisturizing Lotion  Clairol Herbal Essence Moisturizing Lotion, Dry Skin  Clairol Herbal Essence Moisturizing Lotion, Extra Dry Skin  Clairol Herbal Essence Moisturizing Lotion, Normal Skin  Curel Age Defying Therapeutic Moisturizing Lotion with Alpha Hydroxy  Curel Extreme Care Body Lotion  Curel Soothing Hands Moisturizing Hand Lotion  Curel Therapeutic Moisturizing Cream, Fragrance-Free  Curel Therapeutic Moisturizing Lotion, Fragrance-Free  Curel Therapeutic Moisturizing Lotion, Original Formula  Eucerin Daily Replenishing Lotion  Eucerin  Dry Skin Therapy Plus Alpha Hydroxy Crme  Eucerin Dry Skin Therapy Plus Alpha Hydroxy Lotion  Eucerin Original Crme  Eucerin Original Lotion  Eucerin Plus Crme Eucerin Plus Lotion  Eucerin TriLipid Replenishing Lotion  Keri Anti-Bacterial Hand Lotion  Keri Deep Conditioning Original Lotion Dry Skin Formula Softly Scented  Keri Deep Conditioning Original Lotion, Fragrance Free Sensitive Skin Formula  Keri Lotion Fast Absorbing Fragrance Free Sensitive Skin Formula  Keri Lotion Fast Absorbing Softly Scented Dry Skin Formula  Keri Original Lotion  Keri Skin Renewal Lotion Keri Silky Smooth Lotion  Keri Silky Smooth Sensitive Skin Lotion  Nivea Body Creamy Conditioning Oil  Nivea Body Extra Enriched Lotion  Nivea Body Original Lotion  Nivea Body Sheer Moisturizing Lotion Nivea Crme  Nivea Skin Firming Lotion  NutraDerm 30 Skin Lotion  NutraDerm Skin Lotion  NutraDerm Therapeutic Skin Cream  NutraDerm Therapeutic  Skin Lotion  ProShield Protective Hand Cream  Provon moisturizing lotion  Please read over the following fact sheets that you were given.

## 2024-01-07 ENCOUNTER — Encounter (HOSPITAL_COMMUNITY)
Admission: RE | Admit: 2024-01-07 | Discharge: 2024-01-07 | Disposition: A | Discharge status: Home / Self Care | Source: Ambulatory Visit | Attending: Orthopaedic Surgery | Admitting: Orthopaedic Surgery

## 2024-01-07 ENCOUNTER — Other Ambulatory Visit: Payer: Self-pay

## 2024-01-07 ENCOUNTER — Encounter (HOSPITAL_COMMUNITY): Payer: Self-pay

## 2024-01-07 VITALS — BP 117/73 | HR 56 | Temp 98.2°F | Resp 17 | Ht 61.0 in | Wt 179.9 lb

## 2024-01-07 DIAGNOSIS — G4733 Obstructive sleep apnea (adult) (pediatric): Secondary | ICD-10-CM | POA: Insufficient documentation

## 2024-01-07 DIAGNOSIS — G4736 Sleep related hypoventilation in conditions classified elsewhere: Secondary | ICD-10-CM | POA: Insufficient documentation

## 2024-01-07 DIAGNOSIS — Z01812 Encounter for preprocedural laboratory examination: Secondary | ICD-10-CM | POA: Diagnosis not present

## 2024-01-07 DIAGNOSIS — M1711 Unilateral primary osteoarthritis, right knee: Secondary | ICD-10-CM | POA: Diagnosis not present

## 2024-01-07 DIAGNOSIS — I10 Essential (primary) hypertension: Secondary | ICD-10-CM | POA: Diagnosis not present

## 2024-01-07 DIAGNOSIS — Z7901 Long term (current) use of anticoagulants: Secondary | ICD-10-CM | POA: Diagnosis not present

## 2024-01-07 DIAGNOSIS — I480 Paroxysmal atrial fibrillation: Secondary | ICD-10-CM | POA: Insufficient documentation

## 2024-01-07 DIAGNOSIS — E039 Hypothyroidism, unspecified: Secondary | ICD-10-CM | POA: Diagnosis not present

## 2024-01-07 DIAGNOSIS — D869 Sarcoidosis, unspecified: Secondary | ICD-10-CM | POA: Diagnosis not present

## 2024-01-07 DIAGNOSIS — K219 Gastro-esophageal reflux disease without esophagitis: Secondary | ICD-10-CM | POA: Insufficient documentation

## 2024-01-07 DIAGNOSIS — Z79899 Other long term (current) drug therapy: Secondary | ICD-10-CM | POA: Diagnosis not present

## 2024-01-07 DIAGNOSIS — R7303 Prediabetes: Secondary | ICD-10-CM | POA: Diagnosis not present

## 2024-01-07 DIAGNOSIS — F419 Anxiety disorder, unspecified: Secondary | ICD-10-CM | POA: Diagnosis not present

## 2024-01-07 DIAGNOSIS — K760 Fatty (change of) liver, not elsewhere classified: Secondary | ICD-10-CM | POA: Diagnosis not present

## 2024-01-07 DIAGNOSIS — Z79631 Long term (current) use of antimetabolite agent: Secondary | ICD-10-CM | POA: Insufficient documentation

## 2024-01-07 DIAGNOSIS — E785 Hyperlipidemia, unspecified: Secondary | ICD-10-CM | POA: Insufficient documentation

## 2024-01-07 DIAGNOSIS — K449 Diaphragmatic hernia without obstruction or gangrene: Secondary | ICD-10-CM | POA: Insufficient documentation

## 2024-01-07 DIAGNOSIS — K769 Liver disease, unspecified: Secondary | ICD-10-CM

## 2024-01-07 DIAGNOSIS — Z01818 Encounter for other preprocedural examination: Secondary | ICD-10-CM

## 2024-01-07 HISTORY — DX: Unspecified dementia, unspecified severity, without behavioral disturbance, psychotic disturbance, mood disturbance, and anxiety: F03.90

## 2024-01-07 HISTORY — DX: Personal history of other diseases of the digestive system: Z87.19

## 2024-01-07 HISTORY — DX: Sarcoidosis, unspecified: D86.9

## 2024-01-07 HISTORY — DX: Anxiety disorder, unspecified: F41.9

## 2024-01-07 LAB — SURGICAL PCR SCREEN
MRSA, PCR: NEGATIVE
Staphylococcus aureus: NEGATIVE

## 2024-01-07 LAB — CBC
HCT: 42.1 % (ref 36.0–46.0)
Hemoglobin: 13.7 g/dL (ref 12.0–15.0)
MCH: 29.9 pg (ref 26.0–34.0)
MCHC: 32.5 g/dL (ref 30.0–36.0)
MCV: 91.9 fL (ref 80.0–100.0)
Platelets: 217 10*3/uL (ref 150–400)
RBC: 4.58 MIL/uL (ref 3.87–5.11)
RDW: 13.6 % (ref 11.5–15.5)
WBC: 7.1 10*3/uL (ref 4.0–10.5)
nRBC: 0 % (ref 0.0–0.2)

## 2024-01-07 LAB — COMPREHENSIVE METABOLIC PANEL WITH GFR
ALT: 18 U/L (ref 0–44)
AST: 20 U/L (ref 15–41)
Albumin: 3.8 g/dL (ref 3.5–5.0)
Alkaline Phosphatase: 66 U/L (ref 38–126)
Anion gap: 9 (ref 5–15)
BUN: 25 mg/dL — ABNORMAL HIGH (ref 8–23)
CO2: 28 mmol/L (ref 22–32)
Calcium: 9.6 mg/dL (ref 8.9–10.3)
Chloride: 103 mmol/L (ref 98–111)
Creatinine, Ser: 0.62 mg/dL (ref 0.44–1.00)
GFR, Estimated: >60 mL/min (ref >60)
Glucose, Bld: 100 mg/dL — ABNORMAL HIGH (ref 70–99)
Potassium: 4.2 mmol/L (ref 3.5–5.1)
Sodium: 140 mmol/L (ref 135–145)
Total Bilirubin: 1.1 mg/dL (ref 0.0–1.2)
Total Protein: 7.1 g/dL (ref 6.5–8.1)

## 2024-01-07 NOTE — Progress Notes (Signed)
 PCP - Dr. Bobbette Burns Cardiologist - Dr. Alexandria Angel - Last office visit 05/18/2023 Pulmonologist - Dr. Genetta Kenning  PPM/ICD - Denies Device Orders - n/a Rep Notified - n/a  Chest x-ray - n/a EKG - 11/03/2023 Stress Test - 01/16/2022 (CE) ECHO - 07/14/2022 Cardiac Cath - Denies  Sleep Study - +OSA, but very mild case per pt. No CPAP required. She was recently diagnosed with Sarcoidosis and requires 2L [place] at bedtime.   Pt is Pre-DM  Last dose of GLP1 agonist- n/a   GLP1 instructions: n/a  Blood Thinner Instructions: Pt instructed to hold Xarelto  for 3 days. Last dose will be May 2nd.  Aspirin Instructions: n/a  ERAS Protcol - Clear liquids until 0830 morning of surgery. PRE-SURGERY Ensure or G2- Ensure given to pt with instructions  COVID TEST- n/a   Anesthesia review: Yes. Cardiac and Pulmonary Clearance. Pt was instructed to stop Methotrexate  for one week prior to surgery. Her last dose was April 20th.  Patient denies shortness of breath, fever, cough and chest pain at PAT appointment. Pt denies any respiratory illness/infection in the last two months.    All instructions explained to the patient, with a verbal understanding of the material. Patient agrees to go over the instructions while at home for a better understanding. Patient also instructed to self quarantine after being tested for COVID-19. The opportunity to ask questions was provided.

## 2024-01-08 NOTE — Progress Notes (Signed)
 Anesthesia Chart Review:  Case: [id number] Date/Time: 01/15/24 1115   Procedure: ARTHROPLASTY, KNEE, TOTAL (Right: Knee)   Anesthesia type: Spinal   Diagnosis: Primary osteoarthritis of right knee [M17.11]   Pre-op diagnosis: osteoarthritis right knee   [facility] RO[facility] / [facility] OR   Surgeons: Arnie Lao, MD       DISCUSSION:  Patient is a 73 year old female scheduled for the above procedure.  History includes never smoker, post-operative N/V, HTN, HLD, Sarcoidosis (diagnosed 06/2022), PAF, prediabetes, hypothyroidism, GERD, hiatal hernia, fatty liver, OSA (no significant OSA on repeat study 03/2022), nocturnal hypoxemia (O2 2L nightly), memory difficulties, anxiety.    She has pulmonary Sarcoidosis that is followed by Dr. Washington Hacker, last office visit 11/06/23. She was diagnosed ~ 06/2022 and stated on steroids, but discontinued in January 2024 due to weight gain and irritability. She was started on methotrexate  in February 2024 with improvement in symptoms but persistent cough, upper airway mucous which improved afater ENT started her on saline rinses, Flonase , and Zyrtec . Preoperative pulmonology risk assessment outlined by Dr. Washington Hacker on 01/02/24:  "Risk assessment : LOW   Patient medication recommendation --If planning for surgery. Please stop methotrexate  for one week before surgery and the week of surgery. Restart at prior dosing that you were at before the surgery and continue to taper as discussed (reducing by 1 pill every 2 weeks). --Xarelto  per Cardiology... Overall, I recommend proceeding with the surgery if the risk for respiratory complications are outweighed by the potential benefits. This will need to be discussed between the patient and surgeon.   To reduce risks of respiratory complications, I recommend: --Pre- and post-operative incentive spirometry performed frequently while awake --Avoiding/minimizing use of paralytics if possible during anesthesia." Last  methotrexate  dose reported as 12/30/23.  Last cardiology office visit with Dr. Audery Blazing was on 05/18/23. She was diagnosed with PAF in 2017 and has been treated with flecainide . She is also on Xarelto . ""Echocardiogram November 2023 showed normal LV function, grade 1 diastolic dysfunction. Stress MRI at [facility] May 2023 showed normal LV function, mild left atrial enlargement, no infiltrative process in the myocardium and no ischemia." She had telephonic preoperative cardiology evaluation on 01/02/24 by Marlana Silvan, NP.  She wrote, "Preoperative Cardiovascular Risk Assessment:   According to the Revised Cardiac Risk Index (RCRI), her Perioperative Risk of Major Cardiac Event is (%): 0.4. Her Functional Capacity in METs is: 5.72 according to the [place] Activity Status Index (DASI).Therefore, based on ACC/AHA guidelines, patient would be at acceptable risk for the planned procedure without further cardiovascular testing...     Per office protocol, patient can hold Xarelto  for 3 days prior to procedure. Patient will not need bridging with Lovenox (enoxaparin) around procedure. Please resume Xarelto  as soon as possible postprocedure, at the discretion of the surgeon." Last dose planned for 01/11/24.   Anesthesia team to evaluate on the day of surgery .   VS: BP 117/73   Pulse (!) 56   Temp 36.8 C   Resp 17   Ht 5\' 1"  (1.549 m)   Wt 81.6 kg   SpO2 96%   BMI 33.99 kg/m    PROVIDERS: Copland, Skipper Dumas, MD is PCP  Alexandria Angel, MD is cardiologist Quillian Brunt, MD is pulmonologist Artice Last, MD is ENT Helane Lloyd, FNP is provider at [facility]   LABS: Labs reviewed: Acceptable for surgery. A1c 5.7% on 01/10/23.  (all labs ordered are listed, but only abnormal results are displayed)  Labs Reviewed  COMPREHENSIVE METABOLIC PANEL WITH GFR - Abnormal; Notable for the following components:      Result Value   Glucose, Bld 100 (*)    BUN 25 (*)    All other  components within normal limits  SURGICAL PCR SCREEN  CBC    OTHER: Overnight Oximetry 06/26/23:  SpO2 <88% 1 hour 34 min 37 sec.  Nadir SpO2 74% Qualified for oxygen   PFTS 10/05/22: FVC 2.37 (92%) FEV1 2.02 (104%) Ratio 85  TLC 87% DLCO 65% Interpretation: Normal spirometry and lung volumes. Compared to prior reduced DLCO, remains mild   Baseline Polysomnogram 03/30/22: IMPRESSION:  Nocturnal hypoxemia  Primary Snoring  Dysfunctions associated with sleep stages or arousal from sleep  Nonspecific abnormal EKG  RECOMMENDATIONS:  This study does not demonstrate any significant obstructive or  central sleep disordered breathing. The use of CPAP or autoPAP is  not indicated based on this study. The absence of REM sleep  during this study may have led to some degree of underestimation  of her sleep apnea...    IMAGES: Xray right knee 09/21/23: IMPRESSION: Severe medial compartment and moderate patellofemoral compartment osteoarthritis.  CT Chest 08/13/23: IMPRESSION: - Stable left upper lobe lesion, presumed to be related to pulmonary sarcoidosis. Stable small mediastinal nodes, likely reactive. - 7 mm solid nodule in the medial right middle lobe, unchanged. No follow-up is recommended per Fleischner Society guidelines. - Stable mild subpleural reticulation/fibrosis in the lower lobes, likely related to known sarcoidosis. - Aortic Atherosclerosis (ICD10-I70.0).   MRI L-spine 11/14/22: IMPRESSION: 1. Overall mild lower lumbar spine predominant degenerative change without evidence of high-grade spinal canal stenosis. 2. Moderate to severe right-sided neural foraminal narrowing at L4-L5. 3. Additional degenerative changes, as above.   EKG: 11/03/23: Sinus bradycardia at 49 bpm Prolonged PR interval Similar to Sep 2024 tracing Confirmed by Abby Hocking [phone]) on 11/03/2023 3:54:59 AM   CV: Stress Cardiac MRI 01/16/22 ([facility]): Cardiac MRI:  1. The left ventricle is  normal in cavity size. There is mild basal sigmoid septal hypertrophy.  Global systolic function is normal with an LV ejection fraction calculated at 65%. There are no regional wall motion abnormalities.  2. The right ventricle is normal in cavity size, wall thickness, and systolic function.  3. The left atrium is mildly enlarged. The right atrium is normal in size.  4. The aortic valve is trileaflet in morphology. There is no significant aortic valve stenosis or regurgitation. There is no significant valvular disease.  5. Delayed enhancement imaging demonstrates no evidence of myocardial infarction, scar or infiltrative disease.  6. Adenosine  stress perfusion imaging demonstrates no evidence of inducible myocardial ischemia.  7. No intracardiac thrombus visualized.  8. The pericardium is normal in thickness. There is no significant pericardial effusion.    Echo 09/13/21: IMPRESSIONS   1. Left ventricular ejection fraction, by estimation, is 65 to 70%. The  left ventricle has normal function. The left ventricle demonstrates  regional wall motion abnormalities (there is mild thinning of the basal  intraventricular septum with relative  hypokinesis). No aneurysms. No LV free wall thinning.   2. Left ventricular diastolic parameters are consistent with Grade I  diastolic dysfunction (impaired relaxation).   3. Right ventricular systolic function is normal. The right ventricular  size is normal.   4. The mitral valve is grossly normal. No evidence of mitral valve  regurgitation. No evidence of mitral stenosis.   5. The aortic valve was not well visualized. Aortic valve regurgitation  is not visualized. Aortic valve sclerosis is present, with no evidence of  aortic valve stenosis.   6. The inferior vena cava is normal in size with greater than 50%  respiratory variability, suggesting right atrial pressure of 3 mmHg.  - Comparison(s): Unable to view prior study.  -  Conclusion(s)/Recommendation(s): Consider CMR if clinically indicated.    Cardiac event monitor 11/15/15 - 12/14/15: Sinus rhythm with trigeminy PACs/Artifact and PAF.      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 Fib   Esophageal ulcer    Fatty liver    Medication Induced. 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   Pre-diabetes    Sarcoidosis     Past Surgical History:  Procedure Laterality Date   BREAST BIOPSY Left    needle core biopsy, benign   BRONCHIAL BIOPSY  06/15/2022   Procedure: BRONCHIAL BIOPSIES;  Surgeon: Aleck Hurdle, MD;  Location: [facility] ENDOSCOPY;  Service: Pulmonary;;   BRONCHIAL BIOPSY  06/29/2022   Procedure: BRONCHIAL BIOPSIES;  Surgeon: Prudy Brownie, DO;  Location: [facility] ENDOSCOPY;  Service: Pulmonary;;   BRONCHIAL NEEDLE ASPIRATION BIOPSY  06/15/2022   Procedure: BRONCHIAL NEEDLE ASPIRATION BIOPSIES;  Surgeon: Aleck Hurdle, MD;  Location: [facility] ENDOSCOPY;  Service: Pulmonary;;   BRONCHIAL NEEDLE ASPIRATION BIOPSY  06/29/2022   Procedure: BRONCHIAL NEEDLE ASPIRATION BIOPSIES;  Surgeon: Prudy Brownie, DO;  Location: [facility] ENDOSCOPY;  Service: Pulmonary;;   BRONCHIAL WASHINGS  06/15/2022   Procedure: BRONCHIAL WASHINGS;  Surgeon: Aleck Hurdle, MD;  Location: [facility] ENDOSCOPY;  Service: Pulmonary;;   CATARACT EXTRACTION W/ INTRAOCULAR LENS IMPLANT     COLONOSCOPY     DILATION AND CURETTAGE OF  UTERUS     Miscarriage   HAND SURGERY Left    after a fall   KNEE SURGERY Right    x2   LASIK     ROTATOR CUFF REPAIR Right    x2   TOTAL ABDOMINAL HYSTERECTOMY     VIDEO BRONCHOSCOPY N/A 06/15/2022   Procedure: VIDEO BRONCHOSCOPY WITH FLUORO;  Surgeon: Aleck Hurdle, MD;  Location: [facility] ENDOSCOPY;  Service: Pulmonary;  Laterality: N/A;   VIDEO BRONCHOSCOPY WITH ENDOBRONCHIAL ULTRASOUND  06/15/2022   Procedure: VIDEO BRONCHOSCOPY WITH ENDOBRONCHIAL ULTRASOUND;  Surgeon: Aleck Hurdle, MD;  Location: [facility] ENDOSCOPY;  Service: Pulmonary;;   VIDEO BRONCHOSCOPY WITH ENDOBRONCHIAL ULTRASOUND N/A 06/29/2022   Procedure: VIDEO BRONCHOSCOPY WITH ENDOBRONCHIAL ULTRASOUND;  Surgeon: Prudy Brownie, DO;  Location: [facility] ENDOSCOPY;  Service: Pulmonary;  Laterality: N/A;    MEDICATIONS:  alendronate  (FOSAMAX ) 70 MG tablet   amLODipine  (NORVASC ) 5 MG tablet   atenolol  (TENORMIN ) 50 MG tablet   benzonatate  (TESSALON ) 200 MG capsule   Calcium  Carb-Cholecalciferol (CALCIUM  600 + D PO)   cetirizine  (ZYRTEC ) 10 MG tablet  flecainide  (TAMBOCOR ) 100 MG tablet   fluticasone  (FLONASE ) 50 MCG/ACT nasal spray   folic acid  (FOLVITE ) 1 MG tablet   guaiFENesin  (MUCINEX ) 600 MG 12 hr tablet   levothyroxine  (SYNTHROID ) 125 MCG tablet   LORazepam  (ATIVAN ) 0.5 MG tablet   methotrexate  (RHEUMATREX) 2.5 MG tablet   Multiple Vitamins-Minerals (PRESERVISION AREDS 2+MULTI VIT PO)   omeprazole  (PRILOSEC) 40 MG capsule   ondansetron  (ZOFRAN ) 8 MG tablet   Propylene Glycol (SYSTANE BALANCE) 0.6 % SOLN   rivaroxaban  (XARELTO ) 20 MG TABS tablet   rosuvastatin  (CRESTOR ) 20 MG tablet   sertraline  (ZOLOFT ) 50 MG tablet   traZODone  (DESYREL ) 50 MG tablet   No current facility-administered medications for this encounter.    Ella Gun, PA-C Surgical Short Stay/Anesthesiology [facility] Phone [phone] [facility] Phone [phone] 01/08/2024 6:47 PM

## 2024-01-14 ENCOUNTER — Telehealth: Payer: Self-pay | Admitting: *Deleted

## 2024-01-14 NOTE — H&P (Signed)
 TOTAL KNEE ADMISSION H&P  Patient is being admitted for right total knee arthroplasty.  Subjective:  Chief Complaint:right knee pain.  HPI: Lisa Acosta, 73 y.o. female, has a history of pain and functional disability in the right knee due to arthritis and has failed non-surgical conservative treatments for greater than 12 weeks to includeNSAID's and/or analgesics, corticosteriod injections, viscosupplementation injections, flexibility and strengthening excercises, and activity modification.  Onset of symptoms was gradual, starting several years ago with gradually worsening course since that time. The patient noted prior procedures on the knee to include  arthroscopy and menisectomy on the right knee(s).  Patient currently rates pain in the right knee(s) at 10 out of 10 with activity. Patient has night pain, worsening of pain with activity and weight bearing, pain that interferes with activities of daily living, pain with passive range of motion, crepitus, and joint swelling.  Patient has evidence of subchondral sclerosis, periarticular osteophytes, and joint space narrowing by imaging studies. There is no active infection.  Patient Active Problem List   Diagnosis Date Noted   Unilateral primary osteoarthritis, right knee 10/15/2023   Sarcoidosis 03/08/2023   High risk medication use 03/08/2023   Nocturnal hypoxemia 03/08/2023   Muscle pain 01/09/2023   Generalized obesity 10/12/2022   Class 2 severe obesity with serious comorbidity and body mass index (BMI) of 35.0 to 35.9 in adult [facility] 08/15/2022   Lung infiltrate    Persistent pneumonia    Hilar adenopathy    Vitamin D  insufficiency 01/10/2021   Cognitive complaints with normal neuropsychological exam 07/29/2020   Memory difficulty 08/18/2019   Right wrist pain 05/04/2019   Mild episode of recurrent major depressive disorder (HCC) 12/11/2018   Depression, major, single episode, mild (HCC) 10/22/2018   Gait abnormality  10/14/2018   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   Moderate episode of recurrent major depressive disorder (HCC) 09/26/2018   Generalized anxiety disorder 09/26/2018   Osteoporosis 01/25/2018   Fatty liver 08/22/2017   Gallbladder sludge 08/22/2017   Upper abdominal pain 07/19/2017   Gastroesophageal reflux disease with esophagitis 06/01/2017   Encounter for monitoring flecainide  therapy 04/12/2017   Osteoarthritis of hands, 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   Dyspnea 09/22/2016   OSA (obstructive sleep apnea) 08/31/2016   PAF (paroxysmal atrial fibrillation) (HCC) 03/16/2016   Hypothyroid 03/16/2016   Obesity (BMI 30-39.9) 03/16/2016   Skin lesion of left upper extremity 02/05/2016   Essential hypertension 11/10/2015   Hyperlipidemia 11/10/2015   Acute gastritis 06/05/2014   Allergic rhinitis 06/05/2014   Arthritis 06/05/2014   Diaphragmatic hernia 06/05/2014   Difficulty swallowing 06/05/2014   Diverticulosis of colon 06/05/2014   Hyperglycemia 06/05/2014   Impaired fasting glucose 06/05/2014   Insomnia 06/05/2014   Lower back pain 06/05/2014   Osteoarthritis of shoulder 06/05/2014   Pain in joint, pelvic region and thigh 06/05/2014   Pain of right thumb 06/05/2014   Esophageal reflux 03/18/2014   Esophageal ulcer 03/18/2014   Anxiety state 11/25/2013   Past Medical History:  Diagnosis Date   Anxiety    Arthritis    Atrial fibrillation (HCC)    Back pain    Complication of anesthesia    Constipation    Dementia (HCC)    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 Fib   Esophageal ulcer    Fatty liver    Medication Induced. 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   Pre-diabetes    Sarcoidosis     Past Surgical History:  Procedure Laterality Date   BREAST BIOPSY Left    needle core biopsy, benign   BRONCHIAL BIOPSY  06/15/2022   Procedure: BRONCHIAL BIOPSIES;  Surgeon: Aleck Hurdle, MD;  Location: [facility] ENDOSCOPY;  Service: Pulmonary;;   BRONCHIAL BIOPSY  06/29/2022   Procedure: BRONCHIAL BIOPSIES;  Surgeon: Prudy Brownie, DO;  Location: [facility] ENDOSCOPY;  Service: Pulmonary;;   BRONCHIAL NEEDLE ASPIRATION BIOPSY  06/15/2022   Procedure: BRONCHIAL NEEDLE ASPIRATION BIOPSIES;  Surgeon: Aleck Hurdle, MD;  Location: [facility] ENDOSCOPY;  Service: Pulmonary;;   BRONCHIAL NEEDLE ASPIRATION BIOPSY  06/29/2022   Procedure: BRONCHIAL NEEDLE ASPIRATION BIOPSIES;  Surgeon: Prudy Brownie, DO;  Location: [facility] ENDOSCOPY;  Service: Pulmonary;;   BRONCHIAL WASHINGS  06/15/2022   Procedure: BRONCHIAL WASHINGS;  Surgeon: Aleck Hurdle, MD;  Location: [facility] ENDOSCOPY;  Service: Pulmonary;;   CATARACT EXTRACTION W/ INTRAOCULAR LENS IMPLANT     COLONOSCOPY     DILATION AND CURETTAGE OF UTERUS     Miscarriage   HAND SURGERY Left    after a fall   KNEE SURGERY Right    x2   LASIK     ROTATOR CUFF REPAIR Right    x2   TOTAL ABDOMINAL HYSTERECTOMY     VIDEO BRONCHOSCOPY N/A 06/15/2022   Procedure: VIDEO BRONCHOSCOPY WITH FLUORO;  Surgeon: Aleck Hurdle, MD;  Location: [facility] ENDOSCOPY;  Service: Pulmonary;  Laterality: N/A;   VIDEO BRONCHOSCOPY WITH ENDOBRONCHIAL ULTRASOUND  06/15/2022   Procedure: VIDEO BRONCHOSCOPY WITH ENDOBRONCHIAL ULTRASOUND;  Surgeon: Aleck Hurdle, MD;  Location: [facility] ENDOSCOPY;  Service: Pulmonary;;   VIDEO BRONCHOSCOPY WITH ENDOBRONCHIAL ULTRASOUND N/A 06/29/2022   Procedure: VIDEO BRONCHOSCOPY WITH ENDOBRONCHIAL ULTRASOUND;  Surgeon: Prudy Brownie, DO;  Location: [facility] ENDOSCOPY;  Service: Pulmonary;  Laterality: N/A;    No current facility-administered  medications for this encounter.   Current Outpatient Medications  Medication Sig Dispense Refill Last Dose/Taking   alendronate  (FOSAMAX ) 70 MG tablet Take 1 tablet (70 mg total) by mouth every 7 (seven) days. Take with a full glass of water on an empty stomach. 12 tablet 3 Taking   amLODipine  (NORVASC ) 5 MG tablet Take 1 tablet (5 mg total) by mouth daily. 90 tablet 1 Taking   atenolol  (TENORMIN ) 50 MG tablet TAKE 1 TABLET BY MOUTH EVERY DAY 90 tablet 1 Taking   Calcium  Carb-Cholecalciferol (CALCIUM  600 + D PO) Take 1 tablet by mouth in the morning and at bedtime.   Taking   cetirizine  (ZYRTEC ) 10 MG tablet Take 1 tablet (10 mg total) by mouth daily. 30 tablet 11 Taking   flecainide  (TAMBOCOR ) 100 MG tablet TAKE 1 TABLET BY MOUTH 2 TIMES A DAY 180 tablet 0 Taking   fluticasone  (FLONASE ) 50 MCG/ACT nasal spray Place 2 sprays into both nostrils 2 (two) times daily. 16 g 6 Taking   folic acid  (FOLVITE ) 1 MG tablet Take 2 tablets (2 mg total) by mouth daily. 180 tablet 3 Taking   guaiFENesin  (MUCINEX ) 600 MG 12 hr tablet Take 2 tablets (1,200 mg total) by mouth 2 (  two) times daily. (Patient taking differently: Take 1,200 mg by mouth daily.) 30 tablet 0 Taking Differently   levothyroxine  (SYNTHROID ) 125 MCG tablet Take 1 tablet (125 mcg total) by mouth daily before breakfast. 90 tablet 1 Taking   Multiple Vitamins-Minerals (PRESERVISION AREDS 2+MULTI VIT PO) Take 1 capsule by mouth daily.   Taking   omeprazole  (PRILOSEC) 40 MG capsule Take 1 capsule (40 mg total) by mouth in the morning and at bedtime. (Patient taking differently: Take 40 mg by mouth daily.) 180 capsule 1 Taking Differently   ondansetron  (ZOFRAN ) 8 MG tablet Take 1 tablet (8 mg total) by mouth every 8 (eight) hours as needed for nausea or vomiting. 30 tablet 2 Taking As Needed   Propylene Glycol (SYSTANE BALANCE) 0.6 % SOLN Place 1 drop into both eyes as needed (dry eyes).   Taking As Needed   rivaroxaban  (XARELTO ) 20 MG TABS tablet  TAKE 1 TABLET EVERY DAY WITH SUPPER 30 tablet 11 Taking   rosuvastatin  (CRESTOR ) 20 MG tablet Take 1 tablet (20 mg total) by mouth daily. 90 tablet 1 Taking   sertraline  (ZOLOFT ) 50 MG tablet Take 1 tablet (50 mg total) by mouth daily. (Patient taking differently: Take 25 mg by mouth in the morning and at bedtime.) 90 tablet 3 Taking Differently   traZODone  (DESYREL ) 50 MG tablet TAKE 1.5 TABLETS BY MOUTH AT BEDTIME. (Patient taking differently: Take 25 mg by mouth at bedtime.) 135 tablet 3 Taking Differently   benzonatate  (TESSALON ) 200 MG capsule Take 1 capsule (200 mg total) by mouth 3 (three) times daily as needed for cough. (Patient not taking: Reported on 12/31/2023) 60 capsule 2    LORazepam  (ATIVAN ) 0.5 MG tablet Take 1-2 tablets (0.5-1 mg total) by mouth at bedtime. Use as needed for sleep (Patient not taking: Reported on 01/02/2024) 60 tablet 0 Not Taking   methotrexate  (RHEUMATREX) 2.5 MG tablet Take 8 tablets (20 mg total) by mouth once a week. Caution:Chemotherapy. Protect from light. 96 tablet 2    Allergies  Allergen Reactions   Dextran Anaphylaxis   Tree Extract    Penicillin G Rash   Penicillins Rash   Sulfa Antibiotics Rash and Other (See Comments)   Wound Dressing Adhesive Rash    Social History   Tobacco Use   Smoking status: Never    Passive exposure: Current (doesn't smoke in house now or past 51yrs)   Smokeless tobacco: Never  Substance Use Topics   Alcohol use: Yes    Comment: rare (1-2x per year)    Family History  Problem Relation Age of Onset   Atrial fibrillation Mother    Congestive Heart Failure Mother    Depression Mother    Heart disease Mother    Stroke Mother    Thyroid  disease Mother    Anxiety disorder Mother    Obesity Mother    Heart disease Father    Alcohol abuse Father    High blood pressure Father    High Cholesterol Father    Alcoholism Father    Depression Sister    Atrial fibrillation Sister    Heart attack Sister    Heart  disease Brother    Heart disease Brother    Depression Daughter      Review of Systems  Objective:  Physical Exam Vitals reviewed.  Constitutional:      Appearance: Normal appearance. She is normal weight.  HENT:     Head: Normocephalic and atraumatic.  Eyes:     Extraocular Movements:  Extraocular movements intact.     Pupils: Pupils are equal, round, and reactive to light.  Cardiovascular:     Rate and Rhythm: Normal rate.  Pulmonary:     Effort: Pulmonary effort is normal.     Breath sounds: Normal breath sounds.  Abdominal:     Palpations: Abdomen is soft.  Musculoskeletal:     Cervical back: Normal range of motion and neck supple.     Right knee: Effusion, bony tenderness and crepitus present. Decreased range of motion. Tenderness present over the medial joint line and lateral joint line. Abnormal alignment and abnormal meniscus.  Neurological:     Mental Status: She is alert and oriented to person, place, and time.  Psychiatric:        Behavior: Behavior normal.     Vital signs in last 24 hours:    Labs:   Estimated body mass index is 33.99 kg/m as calculated from the following:   Height as of 01/07/24: 5\' 1"  (1.549 m).   Weight as of 01/07/24: 81.6 kg.   Imaging Review Plain radiographs demonstrate severe degenerative joint disease of the right knee(s). The overall alignment ismild varus. The bone quality appears to be good for age and reported activity level.      Assessment/Plan:  End stage arthritis, right knee   The patient history, physical examination, clinical judgment of the provider and imaging studies are consistent with end stage degenerative joint disease of the right knee(s) and total knee arthroplasty is deemed medically necessary. The treatment options including medical management, injection therapy arthroscopy and arthroplasty were discussed at length. The risks and benefits of total knee arthroplasty were presented and reviewed. The risks  due to aseptic loosening, infection, stiffness, patella tracking problems, thromboembolic complications and other imponderables were discussed. The patient acknowledged the explanation, agreed to proceed with the plan and consent was signed. Patient is being admitted for inpatient treatment for surgery, pain control, PT, OT, prophylactic antibiotics, VTE prophylaxis, progressive ambulation and ADL's and discharge planning. The patient is planning to be discharged home with home health services

## 2024-01-14 NOTE — Care Plan (Signed)
 [facility] RNCM call to patient prior to her upcoming Right total knee arthroplasty with Dr. Lucienne Ryder on 01/15/24 at [facility] She is agreeable to case management. She lives with her spouse, but in a downstairs basement apartment with her daughter and SIL staying upstairs. She will have assistance at home after discharge. Anticipate HHPT will be needed after a short hospital stay. Referral made to [facility] after choice provided. She does have a rollator as well as a RW, but unsure if it fits her size as it is her husband's. May need RW. Reviewed all post op care instructions. Will continue to follow for needs.

## 2024-01-14 NOTE — Telephone Encounter (Signed)
 [facility] pre-op call completed.

## 2024-01-15 ENCOUNTER — Ambulatory Visit (HOSPITAL_COMMUNITY): Payer: Self-pay | Admitting: Vascular Surgery

## 2024-01-15 ENCOUNTER — Ambulatory Visit (HOSPITAL_COMMUNITY): Admitting: Anesthesiology

## 2024-01-15 ENCOUNTER — Observation Stay (HOSPITAL_COMMUNITY)

## 2024-01-15 ENCOUNTER — Other Ambulatory Visit: Payer: Self-pay

## 2024-01-15 ENCOUNTER — Encounter (HOSPITAL_COMMUNITY): Payer: Self-pay | Admitting: Orthopaedic Surgery

## 2024-01-15 ENCOUNTER — Encounter (HOSPITAL_COMMUNITY)
Admission: RE | Disposition: A | Discharge status: Skilled Nursing Facility | Payer: Self-pay | Source: Home / Self Care | Attending: Orthopaedic Surgery

## 2024-01-15 ENCOUNTER — Inpatient Hospital Stay (HOSPITAL_COMMUNITY)
Admission: RE | Admit: 2024-01-15 | Discharge: 2024-01-21 | DRG: 470 | Disposition: A | Discharge status: Skilled Nursing Facility | Attending: Orthopaedic Surgery | Admitting: Orthopaedic Surgery

## 2024-01-15 DIAGNOSIS — Z7983 Long term (current) use of bisphosphonates: Secondary | ICD-10-CM

## 2024-01-15 DIAGNOSIS — M1711 Unilateral primary osteoarthritis, right knee: Secondary | ICD-10-CM | POA: Diagnosis not present

## 2024-01-15 DIAGNOSIS — I10 Essential (primary) hypertension: Secondary | ICD-10-CM | POA: Diagnosis not present

## 2024-01-15 DIAGNOSIS — I4891 Unspecified atrial fibrillation: Secondary | ICD-10-CM | POA: Diagnosis not present

## 2024-01-15 DIAGNOSIS — R7303 Prediabetes: Secondary | ICD-10-CM | POA: Diagnosis not present

## 2024-01-15 DIAGNOSIS — Z823 Family history of stroke: Secondary | ICD-10-CM | POA: Diagnosis not present

## 2024-01-15 DIAGNOSIS — R4189 Other symptoms and signs involving cognitive functions and awareness: Secondary | ICD-10-CM | POA: Diagnosis not present

## 2024-01-15 DIAGNOSIS — Z83438 Family history of other disorder of lipoprotein metabolism and other lipidemia: Secondary | ICD-10-CM

## 2024-01-15 DIAGNOSIS — Z7901 Long term (current) use of anticoagulants: Secondary | ICD-10-CM | POA: Diagnosis not present

## 2024-01-15 DIAGNOSIS — Z8349 Family history of other endocrine, nutritional and metabolic diseases: Secondary | ICD-10-CM | POA: Diagnosis not present

## 2024-01-15 DIAGNOSIS — Z79890 Hormone replacement therapy (postmenopausal): Secondary | ICD-10-CM | POA: Diagnosis not present

## 2024-01-15 DIAGNOSIS — K760 Fatty (change of) liver, not elsewhere classified: Secondary | ICD-10-CM | POA: Diagnosis present

## 2024-01-15 DIAGNOSIS — F411 Generalized anxiety disorder: Secondary | ICD-10-CM | POA: Diagnosis not present

## 2024-01-15 DIAGNOSIS — Z90710 Acquired absence of both cervix and uterus: Secondary | ICD-10-CM | POA: Diagnosis not present

## 2024-01-15 DIAGNOSIS — I480 Paroxysmal atrial fibrillation: Secondary | ICD-10-CM | POA: Diagnosis present

## 2024-01-15 DIAGNOSIS — I959 Hypotension, unspecified: Secondary | ICD-10-CM | POA: Diagnosis not present

## 2024-01-15 DIAGNOSIS — Z8249 Family history of ischemic heart disease and other diseases of the circulatory system: Secondary | ICD-10-CM | POA: Diagnosis not present

## 2024-01-15 DIAGNOSIS — E039 Hypothyroidism, unspecified: Secondary | ICD-10-CM | POA: Diagnosis present

## 2024-01-15 DIAGNOSIS — G4733 Obstructive sleep apnea (adult) (pediatric): Secondary | ICD-10-CM | POA: Diagnosis present

## 2024-01-15 DIAGNOSIS — Z818 Family history of other mental and behavioral disorders: Secondary | ICD-10-CM | POA: Diagnosis not present

## 2024-01-15 DIAGNOSIS — D84821 Immunodeficiency due to drugs: Secondary | ICD-10-CM | POA: Diagnosis not present

## 2024-01-15 DIAGNOSIS — F0394 Unspecified dementia, unspecified severity, with anxiety: Secondary | ICD-10-CM | POA: Diagnosis not present

## 2024-01-15 DIAGNOSIS — J189 Pneumonia, unspecified organism: Secondary | ICD-10-CM

## 2024-01-15 DIAGNOSIS — R5383 Other fatigue: Secondary | ICD-10-CM | POA: Diagnosis not present

## 2024-01-15 DIAGNOSIS — R296 Repeated falls: Secondary | ICD-10-CM | POA: Diagnosis not present

## 2024-01-15 DIAGNOSIS — M810 Age-related osteoporosis without current pathological fracture: Secondary | ICD-10-CM | POA: Diagnosis not present

## 2024-01-15 DIAGNOSIS — D860 Sarcoidosis of lung: Secondary | ICD-10-CM | POA: Diagnosis not present

## 2024-01-15 DIAGNOSIS — Z96651 Presence of right artificial knee joint: Secondary | ICD-10-CM

## 2024-01-15 DIAGNOSIS — Z811 Family history of alcohol abuse and dependence: Secondary | ICD-10-CM

## 2024-01-15 DIAGNOSIS — E785 Hyperlipidemia, unspecified: Secondary | ICD-10-CM | POA: Diagnosis present

## 2024-01-15 DIAGNOSIS — M25561 Pain in right knee: Secondary | ICD-10-CM | POA: Diagnosis not present

## 2024-01-15 DIAGNOSIS — M130 Polyarthritis, unspecified: Secondary | ICD-10-CM | POA: Diagnosis not present

## 2024-01-15 DIAGNOSIS — G4730 Sleep apnea, unspecified: Secondary | ICD-10-CM | POA: Diagnosis not present

## 2024-01-15 DIAGNOSIS — Z471 Aftercare following joint replacement surgery: Secondary | ICD-10-CM | POA: Diagnosis not present

## 2024-01-15 DIAGNOSIS — K279 Peptic ulcer, site unspecified, unspecified as acute or chronic, without hemorrhage or perforation: Secondary | ICD-10-CM | POA: Diagnosis not present

## 2024-01-15 DIAGNOSIS — I2510 Atherosclerotic heart disease of native coronary artery without angina pectoris: Secondary | ICD-10-CM | POA: Diagnosis not present

## 2024-01-15 DIAGNOSIS — G8918 Other acute postprocedural pain: Secondary | ICD-10-CM | POA: Diagnosis not present

## 2024-01-15 DIAGNOSIS — Z7401 Bed confinement status: Secondary | ICD-10-CM | POA: Diagnosis not present

## 2024-01-15 DIAGNOSIS — T50905D Adverse effect of unspecified drugs, medicaments and biological substances, subsequent encounter: Secondary | ICD-10-CM | POA: Diagnosis not present

## 2024-01-15 DIAGNOSIS — F39 Unspecified mood [affective] disorder: Secondary | ICD-10-CM | POA: Diagnosis not present

## 2024-01-15 HISTORY — PX: TOTAL KNEE ARTHROPLASTY: SHX125

## 2024-01-15 SURGERY — ARTHROPLASTY, KNEE, TOTAL
Anesthesia: Spinal | Site: Knee | Laterality: Right

## 2024-01-15 MED ORDER — CEFAZOLIN SODIUM-DEXTROSE 2-4 GM/100ML-% IV SOLN
2.0000 g | Freq: Four times a day (QID) | INTRAVENOUS | Status: AC
  Administered 2024-01-15 – 2024-01-16 (×2): 2 g via INTRAVENOUS
  Filled 2024-01-15 (×2): qty 100

## 2024-01-15 MED ORDER — ALBUMIN HUMAN 5 % IV SOLN: INTRAVENOUS | Status: DC | PRN

## 2024-01-15 MED ORDER — ALUM & MAG HYDROXIDE-SIMETH 200-200-20 MG/5ML PO SUSP: 30.0000 mL | ORAL | Status: DC | PRN

## 2024-01-15 MED ORDER — ONDANSETRON HCL 4 MG PO TABS: 4.0000 mg | ORAL_TABLET | Freq: Four times a day (QID) | ORAL | Status: DC | PRN

## 2024-01-15 MED ORDER — SODIUM CHLORIDE 0.9 % IR SOLN
Status: DC | PRN
  Administered 2024-01-15: 1000 mL

## 2024-01-15 MED ORDER — ONDANSETRON HCL 4 MG/2ML IJ SOLN
INTRAMUSCULAR | Status: AC
  Filled 2024-01-15: qty 2

## 2024-01-15 MED ORDER — METOCLOPRAMIDE HCL 5 MG/ML IJ SOLN: 5.0000 mg | Freq: Three times a day (TID) | INTRAMUSCULAR | Status: DC | PRN

## 2024-01-15 MED ORDER — METHOCARBAMOL 1000 MG/10ML IJ SOLN: 500.0000 mg | Freq: Four times a day (QID) | INTRAMUSCULAR | Status: DC | PRN

## 2024-01-15 MED ORDER — SERTRALINE HCL 25 MG PO TABS
25.0000 mg | ORAL_TABLET | Freq: Two times a day (BID) | ORAL | Status: DC
  Administered 2024-01-15 – 2024-01-21 (×12): 25 mg via ORAL
  Filled 2024-01-15 (×12): qty 1

## 2024-01-15 MED ORDER — ROPIVACAINE HCL 5 MG/ML IJ SOLN
INTRAMUSCULAR | Status: DC | PRN
  Administered 2024-01-15: 20 mL via PERINEURAL

## 2024-01-15 MED ORDER — FENTANYL CITRATE (PF) 250 MCG/5ML IJ SOLN
INTRAMUSCULAR | Status: DC | PRN
  Administered 2024-01-15: 25 ug via INTRAVENOUS

## 2024-01-15 MED ORDER — SODIUM CHLORIDE 0.9 % IV SOLN: INTRAVENOUS | Status: AC

## 2024-01-15 MED ORDER — CEFAZOLIN SODIUM-DEXTROSE 2-4 GM/100ML-% IV SOLN
2.0000 g | INTRAVENOUS | Status: AC
  Administered 2024-01-15: 2 g via INTRAVENOUS
  Filled 2024-01-15: qty 100

## 2024-01-15 MED ORDER — ZOLPIDEM TARTRATE 5 MG PO TABS
5.0000 mg | ORAL_TABLET | Freq: Every evening | ORAL | Status: DC | PRN
  Administered 2024-01-16: 5 mg via ORAL
  Filled 2024-01-15 (×2): qty 1

## 2024-01-15 MED ORDER — OXYCODONE HCL 5 MG PO TABS
10.0000 mg | ORAL_TABLET | ORAL | Status: DC | PRN
  Administered 2024-01-16 (×2): 15 mg via ORAL
  Administered 2024-01-16: 10 mg via ORAL
  Administered 2024-01-16: 15 mg via ORAL
  Administered 2024-01-17 – 2024-01-20 (×8): 10 mg via ORAL
  Administered 2024-01-20 (×2): 15 mg via ORAL
  Filled 2024-01-15: qty 2
  Filled 2024-01-15: qty 3
  Filled 2024-01-15: qty 2
  Filled 2024-01-15 (×2): qty 3
  Filled 2024-01-15 (×2): qty 2
  Filled 2024-01-15: qty 3
  Filled 2024-01-15: qty 2
  Filled 2024-01-15: qty 3

## 2024-01-15 MED ORDER — FENTANYL CITRATE (PF) 250 MCG/5ML IJ SOLN
INTRAMUSCULAR | Status: AC
  Filled 2024-01-15: qty 5

## 2024-01-15 MED ORDER — CLONIDINE HCL (ANALGESIA) 100 MCG/ML EP SOLN
EPIDURAL | Status: DC | PRN
  Administered 2024-01-15: 100 ug

## 2024-01-15 MED ORDER — ONDANSETRON HCL 4 MG/2ML IJ SOLN
INTRAMUSCULAR | Status: DC | PRN
  Administered 2024-01-15: 4 mg via INTRAVENOUS

## 2024-01-15 MED ORDER — LIDOCAINE 2% (20 MG/ML) 5 ML SYRINGE
INTRAMUSCULAR | Status: DC | PRN
  Administered 2024-01-15: 40 mg via INTRAVENOUS

## 2024-01-15 MED ORDER — OXYCODONE HCL 5 MG/5ML PO SOLN: 5.0000 mg | Freq: Once | ORAL | Status: DC | PRN

## 2024-01-15 MED ORDER — TRANEXAMIC ACID-NACL 1000-0.7 MG/100ML-% IV SOLN
1000.0000 mg | INTRAVENOUS | Status: AC
  Administered 2024-01-15: 1000 mg via INTRAVENOUS
  Filled 2024-01-15: qty 100

## 2024-01-15 MED ORDER — OXYCODONE HCL 5 MG PO TABS: 5.0000 mg | ORAL_TABLET | Freq: Once | ORAL | Status: DC | PRN

## 2024-01-15 MED ORDER — FOLIC ACID 1 MG PO TABS
2.0000 mg | ORAL_TABLET | Freq: Every day | ORAL | Status: DC
Start: 2024-01-16 — End: 2024-01-21
  Administered 2024-01-17 – 2024-01-21 (×4): 2 mg via ORAL
  Filled 2024-01-15 (×6): qty 2

## 2024-01-15 MED ORDER — FLECAINIDE ACETATE 100 MG PO TABS
100.0000 mg | ORAL_TABLET | Freq: Two times a day (BID) | ORAL | Status: DC
  Administered 2024-01-15 – 2024-01-21 (×12): 100 mg via ORAL
  Filled 2024-01-15 (×6): qty 2
  Filled 2024-01-15: qty 1
  Filled 2024-01-15 (×2): qty 2
  Filled 2024-01-15: qty 1
  Filled 2024-01-15: qty 2
  Filled 2024-01-15: qty 1
  Filled 2024-01-15 (×2): qty 2
  Filled 2024-01-15: qty 1

## 2024-01-15 MED ORDER — LEVOTHYROXINE SODIUM 100 MCG PO TABS
125.0000 ug | ORAL_TABLET | Freq: Every day | ORAL | Status: DC
  Administered 2024-01-16 – 2024-01-21 (×6): 125 ug via ORAL
  Filled 2024-01-15 (×6): qty 1

## 2024-01-15 MED ORDER — PHENOL 1.4 % MT LIQD
1.0000 | OROMUCOSAL | Status: DC | PRN
  Administered 2024-01-16: 1 via OROMUCOSAL
  Filled 2024-01-15: qty 177

## 2024-01-15 MED ORDER — PANTOPRAZOLE SODIUM 40 MG PO TBEC
40.0000 mg | DELAYED_RELEASE_TABLET | Freq: Every day | ORAL | Status: DC
  Administered 2024-01-17 – 2024-01-21 (×5): 40 mg via ORAL
  Filled 2024-01-15 (×6): qty 1

## 2024-01-15 MED ORDER — POVIDONE-IODINE 10 % EX SWAB
2.0000 | Freq: Once | CUTANEOUS | Status: AC
  Administered 2024-01-15: 2 via TOPICAL

## 2024-01-15 MED ORDER — HYDROMORPHONE HCL 1 MG/ML IJ SOLN
0.5000 mg | INTRAMUSCULAR | Status: DC | PRN
  Administered 2024-01-15 – 2024-01-20 (×9): 1 mg via INTRAVENOUS
  Filled 2024-01-15 (×10): qty 1

## 2024-01-15 MED ORDER — ORAL CARE MOUTH RINSE: 15.0000 mL | Freq: Once | OROMUCOSAL | Status: AC

## 2024-01-15 MED ORDER — FENTANYL CITRATE (PF) 100 MCG/2ML IJ SOLN: 50.0000 ug | Freq: Once | INTRAMUSCULAR | Status: AC

## 2024-01-15 MED ORDER — OXYCODONE HCL 5 MG PO TABS
5.0000 mg | ORAL_TABLET | ORAL | Status: DC | PRN
  Administered 2024-01-15 – 2024-01-20 (×3): 10 mg via ORAL
  Filled 2024-01-15 (×8): qty 2

## 2024-01-15 MED ORDER — PROPOFOL 500 MG/50ML IV EMUL
INTRAVENOUS | Status: DC | PRN
  Administered 2024-01-15: 65 ug/kg/min via INTRAVENOUS

## 2024-01-15 MED ORDER — ATENOLOL 50 MG PO TABS
50.0000 mg | ORAL_TABLET | Freq: Every day | ORAL | Status: DC
  Administered 2024-01-15 – 2024-01-20 (×6): 50 mg via ORAL
  Filled 2024-01-15 (×7): qty 1

## 2024-01-15 MED ORDER — LIDOCAINE 2% (20 MG/ML) 5 ML SYRINGE
INTRAMUSCULAR | Status: AC
  Filled 2024-01-15: qty 5

## 2024-01-15 MED ORDER — BUPIVACAINE IN DEXTROSE 0.75-8.25 % IT SOLN
INTRATHECAL | Status: DC | PRN
  Administered 2024-01-15: 1.8 mL via INTRATHECAL

## 2024-01-15 MED ORDER — POLYVINYL ALCOHOL 1.4 % OP SOLN: 1.0000 [drp] | OPHTHALMIC | Status: DC | PRN

## 2024-01-15 MED ORDER — METOCLOPRAMIDE HCL 5 MG PO TABS: 5.0000 mg | ORAL_TABLET | Freq: Three times a day (TID) | ORAL | Status: DC | PRN

## 2024-01-15 MED ORDER — MENTHOL 3 MG MT LOZG: 1.0000 | LOZENGE | OROMUCOSAL | Status: DC | PRN

## 2024-01-15 MED ORDER — DOCUSATE SODIUM 100 MG PO CAPS
100.0000 mg | ORAL_CAPSULE | Freq: Two times a day (BID) | ORAL | Status: DC
  Administered 2024-01-15 – 2024-01-20 (×11): 100 mg via ORAL
  Filled 2024-01-15 (×12): qty 1

## 2024-01-15 MED ORDER — PROPOFOL 10 MG/ML IV BOLUS
INTRAVENOUS | Status: DC | PRN
  Administered 2024-01-15: 10 mg via INTRAVENOUS

## 2024-01-15 MED ORDER — FENTANYL CITRATE (PF) 100 MCG/2ML IJ SOLN
INTRAMUSCULAR | Status: AC
  Administered 2024-01-15: 50 ug via INTRAVENOUS
  Filled 2024-01-15: qty 2

## 2024-01-15 MED ORDER — LACTATED RINGERS IV SOLN: INTRAVENOUS | Status: DC

## 2024-01-15 MED ORDER — ACETAMINOPHEN 325 MG PO TABS
325.0000 mg | ORAL_TABLET | Freq: Four times a day (QID) | ORAL | Status: DC | PRN
  Administered 2024-01-16 – 2024-01-21 (×5): 650 mg via ORAL
  Filled 2024-01-15 (×5): qty 2

## 2024-01-15 MED ORDER — BUPIVACAINE HCL (PF) 0.5 % IJ SOLN
INTRAMUSCULAR | Status: DC | PRN
  Administered 2024-01-15: 30 mL

## 2024-01-15 MED ORDER — ONDANSETRON HCL 4 MG/2ML IJ SOLN: 4.0000 mg | Freq: Once | INTRAMUSCULAR | Status: DC | PRN

## 2024-01-15 MED ORDER — FENTANYL CITRATE (PF) 100 MCG/2ML IJ SOLN: 25.0000 ug | INTRAMUSCULAR | Status: DC | PRN

## 2024-01-15 MED ORDER — METHOCARBAMOL 500 MG PO TABS
500.0000 mg | ORAL_TABLET | Freq: Four times a day (QID) | ORAL | Status: DC | PRN
  Administered 2024-01-15 – 2024-01-20 (×8): 500 mg via ORAL
  Filled 2024-01-15 (×9): qty 1

## 2024-01-15 MED ORDER — ONDANSETRON HCL 4 MG/2ML IJ SOLN: 4.0000 mg | Freq: Four times a day (QID) | INTRAMUSCULAR | Status: DC | PRN

## 2024-01-15 MED ORDER — MIDAZOLAM HCL 2 MG/2ML IJ SOLN
INTRAMUSCULAR | Status: AC
  Filled 2024-01-15: qty 2

## 2024-01-15 MED ORDER — AMLODIPINE BESYLATE 5 MG PO TABS
5.0000 mg | ORAL_TABLET | Freq: Every day | ORAL | Status: DC
  Administered 2024-01-16 – 2024-01-21 (×6): 5 mg via ORAL
  Filled 2024-01-15 (×6): qty 1

## 2024-01-15 MED ORDER — DIPHENHYDRAMINE HCL 12.5 MG/5ML PO ELIX: 12.5000 mg | ORAL_SOLUTION | ORAL | Status: DC | PRN

## 2024-01-15 MED ORDER — ACETAMINOPHEN 10 MG/ML IV SOLN: 1000.0000 mg | Freq: Once | INTRAVENOUS | Status: DC | PRN

## 2024-01-15 MED ORDER — ORAL CARE MOUTH RINSE: 15.0000 mL | OROMUCOSAL | Status: DC | PRN

## 2024-01-15 MED ORDER — RIVAROXABAN 20 MG PO TABS
20.0000 mg | ORAL_TABLET | Freq: Every day | ORAL | Status: DC
  Administered 2024-01-16 – 2024-01-21 (×6): 20 mg via ORAL
  Filled 2024-01-15 (×6): qty 1

## 2024-01-15 MED ORDER — DEXAMETHASONE SODIUM PHOSPHATE 10 MG/ML IJ SOLN
INTRAMUSCULAR | Status: DC | PRN
  Administered 2024-01-15: 10 mg via INTRAVENOUS

## 2024-01-15 MED ORDER — CHLORHEXIDINE GLUCONATE 0.12 % MT SOLN
15.0000 mL | Freq: Once | OROMUCOSAL | Status: AC
  Administered 2024-01-15: 15 mL via OROMUCOSAL
  Filled 2024-01-15: qty 15

## 2024-01-15 MED ORDER — ROSUVASTATIN CALCIUM 20 MG PO TABS
20.0000 mg | ORAL_TABLET | Freq: Every day | ORAL | Status: DC
  Administered 2024-01-16 – 2024-01-21 (×6): 20 mg via ORAL
  Filled 2024-01-15 (×7): qty 1

## 2024-01-15 MED ORDER — BUPIVACAINE-EPINEPHRINE (PF) 0.25% -1:200000 IJ SOLN
INTRAMUSCULAR | Status: AC
  Filled 2024-01-15: qty 30

## 2024-01-15 MED ORDER — MIDAZOLAM HCL 2 MG/2ML IJ SOLN
INTRAMUSCULAR | Status: AC
Start: 2024-01-15 — End: ?
  Filled 2024-01-15: qty 2

## 2024-01-15 MED ORDER — 0.9 % SODIUM CHLORIDE (POUR BTL) OPTIME
TOPICAL | Status: DC | PRN
  Administered 2024-01-15: 1000 mL

## 2024-01-15 SURGICAL SUPPLY — 57 items
BAG COUNTER SPONGE SURGICOUNT (BAG) ×1 IMPLANT
BANDAGE ESMARK 6X9 LF (GAUZE/BANDAGES/DRESSINGS) ×1 IMPLANT
BLADE SAG 18X100X1.27 (BLADE) ×1 IMPLANT
BLADE SAW SGTL 13.0X1.19X90.0M (BLADE) IMPLANT
BNDG ELASTIC 4INX 5YD STR LF (GAUZE/BANDAGES/DRESSINGS) IMPLANT
BNDG ELASTIC 6INX 5YD STR LF (GAUZE/BANDAGES/DRESSINGS) ×2 IMPLANT
BOWL SMART MIX CTS (DISPOSABLE) IMPLANT
CEMENT BONE R 1X40 (Cement) IMPLANT
COMPONENT FEM CMT CR PS 56 RT (Joint) IMPLANT
COMPONET TIB PSN SZ C 0 DEG RT (Joint) IMPLANT
COOLER ICEMAN CLASSIC (MISCELLANEOUS) ×1 IMPLANT
COVER SURGICAL LIGHT HANDLE (MISCELLANEOUS) ×1 IMPLANT
CUFF TOURN SGL QUICK 42 (TOURNIQUET CUFF) IMPLANT
CUFF TRNQT CYL 34X4.125X (TOURNIQUET CUFF) ×1 IMPLANT
DRAPE EXTREMITY T 121X128X90 (DISPOSABLE) ×1 IMPLANT
DRAPE HALF SHEET 40X57 (DRAPES) ×1 IMPLANT
DRAPE U-SHAPE 47X51 STRL (DRAPES) ×1 IMPLANT
DURAPREP 26ML APPLICATOR (WOUND CARE) ×1 IMPLANT
ELECT CAUTERY BLADE 6.4 (BLADE) ×1 IMPLANT
ELECTRODE REM PT RTRN 9FT ADLT (ELECTROSURGICAL) ×1 IMPLANT
FACESHIELD WRAPAROUND (MASK) ×3 IMPLANT
FACESHIELD WRAPAROUND OR TEAM (MASK) ×2 IMPLANT
GAUZE PAD ABD 8X10 STRL (GAUZE/BANDAGES/DRESSINGS) ×1 IMPLANT
GAUZE SPONGE 4X4 12PLY STRL (GAUZE/BANDAGES/DRESSINGS) ×1 IMPLANT
GAUZE XEROFORM 1X8 LF (GAUZE/BANDAGES/DRESSINGS) ×1 IMPLANT
GAUZE XEROFORM 5X9 LF (GAUZE/BANDAGES/DRESSINGS) IMPLANT
GLOVE BIOGEL PI IND STRL 8 (GLOVE) ×2 IMPLANT
GLOVE ORTHO TXT STRL SZ7.5 (GLOVE) ×1 IMPLANT
GLOVE SURG ORTHO 8.0 STRL STRW (GLOVE) ×1 IMPLANT
GOWN STRL REUS W/ TWL LRG LVL3 (GOWN DISPOSABLE) IMPLANT
GOWN STRL REUS W/ TWL XL LVL3 (GOWN DISPOSABLE) ×2 IMPLANT
IMMOBILIZER KNEE 22 UNIV (SOFTGOODS) ×1 IMPLANT
INSERT TIB AS PERS SZ CD 12 RT (Insert) IMPLANT
IV NS 1000ML BAXH (IV SOLUTION) ×1 IMPLANT
KIT BASIN OR (CUSTOM PROCEDURE TRAY) ×1 IMPLANT
KIT TURNOVER KIT B (KITS) ×1 IMPLANT
MANIFOLD NEPTUNE II (INSTRUMENTS) ×1 IMPLANT
NDL 18GX1X1/2 (RX/OR ONLY) (NEEDLE) IMPLANT
NEEDLE 18GX1X1/2 (RX/OR ONLY) (NEEDLE) IMPLANT
NS IRRIG 1000ML POUR BTL (IV SOLUTION) ×1 IMPLANT
PACK TOTAL JOINT (CUSTOM PROCEDURE TRAY) ×1 IMPLANT
PAD ARMBOARD POSITIONER FOAM (MISCELLANEOUS) ×1 IMPLANT
PAD COLD SHLDR WRAP-ON (PAD) ×1 IMPLANT
PADDING CAST COTTON 6X4 STRL (CAST SUPPLIES) ×1 IMPLANT
PADDING CAST SYNTHETIC 4X4 STR (CAST SUPPLIES) IMPLANT
SET HNDPC FAN SPRY TIP SCT (DISPOSABLE) ×1 IMPLANT
SET PAD KNEE POSITIONER (MISCELLANEOUS) ×1 IMPLANT
STAPLER VISISTAT 35W (STAPLE) ×1 IMPLANT
STEM POLY PAT PLY 29M KNEE (Knees) IMPLANT
SUCTION TUBE FRAZIER 10FR DISP (SUCTIONS) ×1 IMPLANT
SUT VIC AB 0 CT1 27XBRD ANBCTR (SUTURE) ×1 IMPLANT
SUT VIC AB 1 CT1 27XBRD ANBCTR (SUTURE) ×2 IMPLANT
SUT VIC AB 2-0 CT1 TAPERPNT 27 (SUTURE) ×2 IMPLANT
SYR 50ML LL SCALE MARK (SYRINGE) IMPLANT
TOWEL GREEN STERILE (TOWEL DISPOSABLE) ×1 IMPLANT
TOWEL GREEN STERILE FF (TOWEL DISPOSABLE) ×1 IMPLANT
TRAY CATH INTERMITTENT SS 16FR (CATHETERS) IMPLANT

## 2024-01-15 NOTE — Op Note (Signed)
 Operative Note  Date of operation: 01/15/2024 Preoperative diagnosis: Right knee primary osteoarthritis Postoperative diagnosis: Same  Procedure: Right cemented total knee arthroplasty  Implants: Biomet/Zimmer persona cemented knee system Implant Name Type Inv. Item Serial No. Manufacturer Lot No. LRB No. Used Action  CEMENT BONE R 1X40 - [id number] Cement CEMENT BONE R 1X40  ZIMMER RECON(ORTH,TRAU,BIO,SG) [id number] Right 1 Implanted  COMPONENT FEM CMT CR PS 56 RT - [id number] Joint COMPONENT FEM CMT CR PS 56 RT  ZIMMER RECON(ORTH,TRAU,BIO,SG) [id number] Right 1 Implanted  COMPONET TIB PSN SZ C 0 DEG RT - [id number] Joint COMPONET TIB PSN SZ C 0 DEG RT  ZIMMER RECON(ORTH,TRAU,BIO,SG) [id number] Right 1 Implanted  INSERT TIB AS PERS SZ CD 12 RT - [id number] Insert INSERT TIB AS PERS SZ CD 12 RT  ZIMMER RECON(ORTH,TRAU,BIO,SG) [id number] Right 1 Implanted  STEM POLY PAT PLY 63M KNEE - [id number] Knees STEM POLY PAT PLY 63M KNEE  ZIMMER RECON(ORTH,TRAU,BIO,SG) [id number] Right 1 Implanted   Surgeon: Jeanella Milan. Lucienne Ryder, MD Assistant: Malena Scull, PA-C  Anesthesia: #1 right lower extremity adductor canal block, #2 spinal, #3 local Tourniquet time: Under 1 hour EBL: Under 50 cc Antibiotics: IV Ancef Complications: None  Indications: The patient is a 73 year old female with debilitating arthritis involving her right knee this been well-documented with clinical exam and x-ray findings.  At this point her right knee pain has become daily and it is detrimentally affecting her mobility, her quality of life, and her activities of daily living to the point she does wish to proceed with a total knee arthroplasty.  We did discuss the risks of acute blood loss anemia, nerve vessel injury, fracture, infection, DVT, implant failure, and wound healing issues.  She understands that our goals are hopefully decreased pain, improved mobility, and improved quality of life.  Procedure description: After informed  consent was obtained and the appropriate right knee was marked, anesthesia obtained a right lower extremity adductor canal block in the holding room.  The patient was then brought to the operating room and set up on the operating table where spinal anesthesia was obtained.  She was laid in position on the operating table and a nonsterile tourniquet is placed around her upper right thigh.  Her right thigh, knee, leg, ankle and foot were prepped and draped with DuraPrep and sterile drapes including a sterile stockinette.  A timeout was called and she was identified as correct patient the correct right knee.  An Esmarch was then used to wrap of the leg and the tourniquet was plated 300 mm pressure.  With the knee extended a direct midline incision was made over the patella and carried proximally distally.  Dissection was carried down to the knee joint and a medial parapatellar arthrotomy was made finding a moderate joint effusion.  With the knee in the flexed position we found significant cartilage loss at the medial compartment the knee and the patellofemoral joint.  Remnants of the ACL and medial and lateral meniscus were removed.  We then used an extramedullary cutting guide for making our proximal tibia cut correcting for varus and valgus and a 7 degree slope.  We made this cut to take 2 mm off the low side.  We then used an intramedullary based cutting guide through the notch of the femur for our distal femur cut setting this for a right knee at 5 degrees actually rotated and a 10 mm distal femoral cut.  We then brought the knee back down to full extension and with a  10 mm extension block at achieve full extension.  We then back to the femur and put the femoral sizing guide based on the epicondylar axis and Whitesides line.  Based off of this we chose a size 5 femur.  We put a 4-in-1 cutting block for a size 5 femur and made our anterior and posterior cuts followed our chamfer cuts.  She was then turned back to  the tibia.  We chose a size C tibial tray for coverage over the tibial plateau setting this for her right knee and setting the rotation off of the tibial tubercle and the femur.  We did our drill hole and keel punch off of this.  We then trialed our size C right tibia followed by our size 5 right CR standard femur.  We placed a 10 mm thickness right medial congruent polythene insert and went up to a 12 mm thickness insert.  We are pleased with range of motion and stability without answer.  We then made our patella cut and drilled 3 holes for a size 29 patella button.  Again with all trial instrumentation in place we are pleased with range of motion and stability.  All trial instrumentation was removed and then we irrigated the knee with normal saline solution.  Next Marcaine with epinephrine was placed around the arthrotomy.  Finally cement was mixed on the back table and then we cemented our Biomet/Zimmer persona tibial tray for right knee size C followed by cementing our size 5 right narrow CR femur.  We placed a 12 mm thickness right medial congruent polythene insert and cemented our size 29 patella button.  We then let the tourniquet down and hemostasis was obtained with electrocautery.  The arthrotomy was then closed with interrupted #1 Vicryl suture followed by 0 Vicryl to close deep tissue and 2-0 Vicryl to close the subcutaneous tissue.  The skin was closed with staples.  Well-padded sterile dressing is applied.  A Foley catheter was placed to be placed and the patient was then taken the recovery room.  Malena Scull, PA-C did assist during the entire case and beginning to end and his assistance was crucial and medically necessary for soft tissue management and retraction, helping guide implant placement and a layered closure of the wound.

## 2024-01-15 NOTE — Anesthesia Procedure Notes (Signed)
 Spinal  Patient location during procedure: OR Start time: 01/15/2024 12:30 PM End time: 01/15/2024 12:35 PM Reason for block: surgical anesthesia Staffing Performed: anesthesiologist  Anesthesiologist: [name], MD Performed by: [name], MD Authorized by: [name], MD   Preanesthetic Checklist Completed: patient identified, IV checked, site marked, risks and benefits discussed, surgical consent, monitors and equipment checked, pre-op evaluation and timeout performed Spinal Block Patient position: sitting Prep: DuraPrep Patient monitoring: heart rate, cardiac monitor, continuous pulse ox and blood pressure Approach: midline Location: L3-4 Injection technique: single-shot Needle Needle type: Sprotte  Needle gauge: 24 G Needle length: 9 cm Assessment Sensory level: T4 Events: CSF return

## 2024-01-15 NOTE — Discharge Instructions (Signed)
 Per [facility] clinic policy, our goal is ensure optimal postoperative pain control with a multimodal pain management strategy. For all [facility] patients, our goal is to wean post-operative narcotic medications by 6 weeks post-operatively. If this is not possible due to utilization of pain medication prior to surgery, your [facility] doctor will support your acute post-operative pain control for the first 6 weeks postoperatively, with a plan to transition you back to your primary pain team following that. Lisa Acosta will work to ensure a Therapist, occupational.  INSTRUCTIONS AFTER JOINT REPLACEMENT   Remove items at home which could result in a fall. This includes throw rugs or furniture in walking pathways ICE to the affected joint every three hours while awake for 30 minutes at a time, for at least the first 3-5 days, and then as needed for pain and swelling.  Continue to use ice for pain and swelling. You may notice swelling that will progress down to the foot and ankle.  This is normal after surgery.  Elevate your leg when you are not up walking on it.   Continue to use the breathing machine you got in the hospital (incentive spirometer) which will help keep your temperature down.  It is common for your temperature to cycle up and down following surgery, especially at night when you are not up moving around and exerting yourself.  The breathing machine keeps your lungs expanded and your temperature down.   DIET:  As you were doing prior to hospitalization, we recommend a well-balanced diet.  DRESSING / WOUND CARE / SHOWERING  Keep the surgical dressing until follow up.  The dressing is water proof, so you can shower without any extra covering.  IF THE DRESSING FALLS OFF or the wound gets wet inside, change the dressing with sterile gauze.  Please use good hand washing techniques before changing the dressing.  Do not use any lotions or creams on the incision until instructed by your surgeon.     ACTIVITY  Increase activity slowly as tolerated, but follow the weight bearing instructions below.   No driving for 6 weeks or until further direction given by your physician.  You cannot drive while taking narcotics.  No lifting or carrying greater than 10 lbs. until further directed by your surgeon. Avoid periods of inactivity such as sitting longer than an hour when not asleep. This helps prevent blood clots.  You may return to work once you are authorized by your doctor.     WEIGHT BEARING   Weight bearing as tolerated with assist device (walker, cane, etc) as directed, use it as long as suggested by your surgeon or therapist, typically at least 4-6 weeks.   EXERCISES  Results after joint replacement surgery are often greatly improved when you follow the exercise, range of motion and muscle strengthening exercises prescribed by your doctor. Safety measures are also important to protect the joint from further injury. Any time any of these exercises cause you to have increased pain or swelling, decrease what you are doing until you are comfortable again and then slowly increase them. If you have problems or questions, call your caregiver or physical therapist for advice.   Rehabilitation is important following a joint replacement. After just a few days of immobilization, the muscles of the leg can become weakened and shrink (atrophy).  These exercises are designed to build up the tone and strength of the thigh and leg muscles and to improve motion. Often times heat used for twenty to thirty minutes before  working out will loosen up your tissues and help with improving the range of motion but do not use heat for the first two weeks following surgery (sometimes heat can increase post-operative swelling).   These exercises can be done on a training (exercise) mat, on the floor, on a table or on a bed. Use whatever works the best and is most comfortable for you.    Use music or television  while you are exercising so that the exercises are a pleasant break in your day. This will make your life better with the exercises acting as a break in your routine that you can look forward to.   Perform all exercises about fifteen times, three times per day or as directed.  You should exercise both the operative leg and the other leg as well.  Exercises include:   Quad Sets - Tighten up the muscle on the front of the thigh (Quad) and hold for 5-10 seconds.   Straight Leg Raises - With your knee straight (if you were given a brace, keep it on), lift the leg to 60 degrees, hold for 3 seconds, and slowly lower the leg.  Perform this exercise against resistance later as your leg gets stronger.  Leg Slides: Lying on your back, slowly slide your foot toward your buttocks, bending your knee up off the floor (only go as far as is comfortable). Then slowly slide your foot back down until your leg is flat on the floor again.  Angel Wings: Lying on your back spread your legs to the side as far apart as you can without causing discomfort.  Hamstring Strength:  Lying on your back, push your heel against the floor with your leg straight by tightening up the muscles of your buttocks.  Repeat, but this time bend your knee to a comfortable angle, and push your heel against the floor.  You may put a pillow under the heel to make it more comfortable if necessary.   A rehabilitation program following joint replacement surgery can speed recovery and prevent re-injury in the future due to weakened muscles. Contact your doctor or a physical therapist for more information on knee rehabilitation.    CONSTIPATION  Constipation is defined medically as fewer than three stools per week and severe constipation as less than one stool per week.  Even if you have a regular bowel pattern at home, your normal regimen is likely to be disrupted due to multiple reasons following surgery.  Combination of anesthesia, postoperative  narcotics, change in appetite and fluid intake all can affect your bowels.   YOU MUST use at least one of the following options; they are listed in order of increasing strength to get the job done.  They are all available over the counter, and you may need to use some, POSSIBLY even all of these options:    Drink plenty of fluids (prune juice may be helpful) and high fiber foods Colace 100 mg by mouth twice a day  Senokot for constipation as directed and as needed Dulcolax (bisacodyl), take with full glass of water  Miralax (polyethylene glycol) once or twice a day as needed.  If you have tried all these things and are unable to have a bowel movement in the first 3-4 days after surgery call either your surgeon or your primary doctor.    If you experience loose stools or diarrhea, hold the medications until you stool forms back up.  If your symptoms do not get better within 1 week  or if they get worse, check with your doctor.  If you experience "the worst abdominal pain ever" or develop nausea or vomiting, please contact the office immediately for further recommendations for treatment.   ITCHING:  If you experience itching with your medications, try taking only a single pain pill, or even half a pain pill at a time.  You can also use Benadryl over the counter for itching or also to help with sleep.   TED HOSE STOCKINGS:  Use stockings on both legs until for at least 2 weeks or as directed by physician office. They may be removed at night for sleeping.  MEDICATIONS:  See your medication summary on the "After Visit Summary" that nursing will review with you.  You may have some home medications which will be placed on hold until you complete the course of blood thinner medication.  It is important for you to complete the blood thinner medication as prescribed.  PRECAUTIONS:  If you experience chest pain or shortness of breath - call 911 immediately for transfer to the hospital emergency department.    If you develop a fever greater that 101 F, purulent drainage from wound, increased redness or drainage from wound, foul odor from the wound/dressing, or calf pain - CONTACT YOUR SURGEON.                                                   FOLLOW-UP APPOINTMENTS:  If you do not already have a post-op appointment, please call the office for an appointment to be seen by your surgeon.  Guidelines for how soon to be seen are listed in your "After Visit Summary", but are typically between 1-4 weeks after surgery.  OTHER INSTRUCTIONS:   Knee Replacement:  Do not place pillow under knee, focus on keeping the knee straight while resting. CPM instructions: 0-90 degrees, 2 hours in the morning, 2 hours in the afternoon, and 2 hours in the evening. Place foam block, curve side up under heel at all times except when in CPM or when walking.  DO NOT modify, tear, cut, or change the foam block in any way.  POST-OPERATIVE OPIOID TAPER INSTRUCTIONS: It is important to wean off of your opioid medication as soon as possible. If you do not need pain medication after your surgery it is ok to stop day one. Opioids include: Codeine, Hydrocodone(Norco, Vicodin), Oxycodone(Percocet, oxycontin) and hydromorphone amongst others.  Long term and even short term use of opiods can cause: Increased pain response Dependence Constipation Depression Respiratory depression And more.  Withdrawal symptoms can include Flu like symptoms Nausea, vomiting And more Techniques to manage these symptoms Hydrate well Eat regular healthy meals Stay active Use relaxation techniques(deep breathing, meditating, yoga) Do Not substitute Alcohol to help with tapering If you have been on opioids for less than two weeks and do not have pain than it is ok to stop all together.  Plan to wean off of opioids This plan should start within one week post op of your joint replacement. Maintain the same interval or time between taking each dose  and first decrease the dose.  Cut the total daily intake of opioids by one tablet each day Next start to increase the time between doses. The last dose that should be eliminated is the evening dose.   MAKE SURE YOU:  Understand these instructions.  Get help right away if you are not doing well or get worse.    Thank you for letting us be a part of your medical care team.  It is a privilege we respect greatly.  We hope these instructions will help you stay on track for a fast and full recovery!      Dental Antibiotics:  In most cases prophylactic antibiotics for Dental procdeures after total joint surgery are not necessary.  Exceptions are as follows:  1. History of prior total joint infection  2. Severely immunocompromised (Organ Transplant, cancer chemotherapy, Rheumatoid biologic meds such as Humera)  3. Poorly controlled diabetes (A1C &gt; 8.0, blood glucose over 200)  If you have one of these conditions, contact your surgeon for an antibiotic prescription, prior to your dental procedure.

## 2024-01-15 NOTE — Interval H&P Note (Signed)
 History and Physical Interval Note: The patient understands that she is here today for a right total knee replacement to treat her significant right knee pain and arthritis.  There has been no acute or interval change in her medical status.  The risks and benefits of surgery have been discussed in detail and informed consent has been obtained.  The right operative knee has been marked.  01/15/2024 10:42 AM  Lisa Acosta  has presented today for surgery, with the diagnosis of osteoarthritis right knee.  The various methods of treatment have been discussed with the patient and family. After consideration of risks, benefits and other options for treatment, the patient has consented to  Procedure(s): ARTHROPLASTY, KNEE, TOTAL (Right) as a surgical intervention.  The patient's history has been reviewed, patient examined, no change in status, stable for surgery.  I have reviewed the patient's chart and labs.  Questions were answered to the patient's satisfaction.     Arnie Lao

## 2024-01-15 NOTE — Transfer of Care (Signed)
 Immediate Anesthesia Transfer of Care Note  Patient: Lisa Acosta  Procedure(s) Performed: ARTHROPLASTY, KNEE, TOTAL (Right: Knee)  Patient Location: PACU  Anesthesia Type:MAC  Level of Consciousness: awake, alert , and oriented  Airway & Oxygen  Therapy: Patient Spontanous Breathing  Post-op Assessment: Report given to RN, Post -op Vital signs reviewed and stable, and Patient moving all extremities  Post vital signs: Reviewed and stable  Last Vitals:  Vitals Value Taken Time  BP    Temp    Pulse    Resp    SpO2      Last Pain:  Vitals:   01/15/24 1047  TempSrc:   PainSc: 0-No pain      Patients Stated Pain Goal: 0 (01/15/24 1047)  Complications: No notable events documented.

## 2024-01-15 NOTE — Anesthesia Procedure Notes (Signed)
 Anesthesia Regional Block: Adductor canal block   Pre-Anesthetic Checklist: , timeout performed,  Correct Patient, Correct Site, Correct Laterality,  Correct Procedure, Correct Position, site marked,  Risks and benefits discussed,  Surgical consent,  Pre-op evaluation,  At surgeon's request and post-op pain management  Laterality: Right  Prep: Maximum Sterile Barrier Precautions used, chloraprep       Needles:  Injection technique: Single-shot  Needle Type: Echogenic Needle      Needle Gauge: 20     Additional Needles:   Procedures:,,,, ultrasound used (permanent image in chart),,    Narrative:  Start time: 01/15/2024 10:29 AM End time: 01/15/2024 10:33 AM Injection made incrementally with aspirations every 5 mL.  Performed by: Personally  Anesthesiologist: [name], MD

## 2024-01-15 NOTE — Evaluation (Signed)
 Physical Therapy Evaluation Patient Details Name: Lisa Acosta MRN: [id number] DOB: [birth year] Today's Date: 01/15/2024  History of Present Illness  73 y.o. female presents to [facility] hospital on [facility]/15/2024 for elective R TKA. PMH includes HTN, HLD, PAF, GAD, MDD, sarcoidosis.  Clinical Impression  Pt presents to PT with deficits in functional mobility, gait, balance, strength, ROM. Pt is able to initiate ambulation at bedside with support of the RW, but unable to tolerate mobility of household distances due to pain. PT provides education on the TKR exercise packet and encourages frequent mobilization with staff assistance. PT will follow up tomorrow for a progression of gait and mobility.      If plan is discharge home, recommend the following: A little help with walking and/or transfers;A little help with bathing/dressing/bathroom;Assistance with cooking/housework;Assist for transportation;Help with stairs or ramp for entrance   Can travel by private vehicle        Equipment Recommendations None recommended by PT  Recommendations for Other Services       Functional Status Assessment Patient has had a recent decline in their functional status and demonstrates the ability to make significant improvements in function in a reasonable and predictable amount of time.     Precautions / Restrictions Precautions Precautions: Fall;Knee Precaution Booklet Issued: Yes (comment) Recall of Precautions/Restrictions: Intact Restrictions Weight Bearing Restrictions Per Provider Order: Yes RLE Weight Bearing Per Provider Order: Weight bearing as tolerated      Mobility  Bed Mobility Overal bed mobility: Needs Assistance Bed Mobility: Supine to Sit, Sit to Supine     Supine to sit: Contact guard Sit to supine: Min assist        Transfers Overall transfer level: Needs assistance Equipment used: Rolling walker (2 wheels) Transfers: Sit to/from Stand Sit to Stand: Mod assist                 Ambulation/Gait Ambulation/Gait assistance: Min assist Gait Distance (Feet): 2 Feet Assistive device: Rolling walker (2 wheels) Gait Pattern/deviations: Step-to pattern Gait velocity: reduced Gait velocity interpretation: <1.31 ft/sec, indicative of household ambulator   General Gait Details: slowed step-to, reduced foot clearance bilaterally  Stairs            Wheelchair Mobility     Tilt Bed    Modified Rankin (Stroke Patients Only)       Balance Overall balance assessment: Needs assistance Sitting-balance support: No upper extremity supported, Feet supported Sitting balance-Leahy Scale: Good     Standing balance support: Bilateral upper extremity supported, Reliant on assistive device for balance Standing balance-Leahy Scale: Poor                               Pertinent Vitals/Pain Pain Assessment Pain Assessment: 0-10 Pain Score: 7  Pain Location: R knee Pain Descriptors / Indicators: Sore Pain Intervention(s): Premedicated before session    Home Living Family/patient expects to be discharged to:: Private residence Living Arrangements: Spouse/significant other;Children;Parent Available Help at Discharge: Family;Available 24 hours/day (spouse is available 24/7 but cannot physically assist. Other family are available PRN) Type of Home: House Home Access: Level entry       Home Layout: One level (pt and spouse living in an in-law suite in the basement) Home Equipment: Rolling Walker (2 wheels);Rollator (4 wheels);Cane - single point;Shower seat      Prior Function Prior Level of Function : Independent/Modified Independent;Driving  Mobility Comments: ambulatory with SPC in the home and a rollator in the community       Extremity/Trunk Assessment   Upper Extremity Assessment Upper Extremity Assessment: Overall WFL for tasks assessed    Lower Extremity Assessment Lower Extremity Assessment: RLE  deficits/detail RLE Deficits / Details: generalized post-op strength and ROM deficits as anticipated on POD 0    Cervical / Trunk Assessment Cervical / Trunk Assessment: Normal  Communication   Communication Communication: No apparent difficulties    Cognition Arousal: Alert Behavior During Therapy: WFL for tasks assessed/performed   PT - Cognitive impairments: No apparent impairments                         Following commands: Intact       Cueing Cueing Techniques: Verbal cues     General Comments General comments (skin integrity, edema, etc.): VSS on RA    Exercises Other Exercises Other Exercises: PT provides education on TKR exercise packet   Assessment/Plan    PT Assessment Patient needs continued PT services  PT Problem List Decreased strength;Decreased range of motion;Decreased activity tolerance;Decreased balance;Decreased mobility;Decreased knowledge of use of DME;Pain       PT Treatment Interventions DME instruction;Gait training;Stair training;Functional mobility training;Therapeutic activities;Therapeutic exercise;Balance training;Neuromuscular re-education;Patient/family education    PT Goals (Current goals can be found in the Care Plan section)  Acute Rehab PT Goals Patient Stated Goal: to return to independence PT Goal Formulation: With patient Time For Goal Achievement: 01/19/24 Potential to Achieve Goals: Good    Frequency 7X/week     Co-evaluation               AM-PAC PT "6 Clicks" Mobility  Outcome Measure Help needed turning from your back to your side while in a flat bed without using bedrails?: A Little Help needed moving from lying on your back to sitting on the side of a flat bed without using bedrails?: A Little Help needed moving to and from a bed to a chair (including a wheelchair)?: A Lot Help needed standing up from a chair using your arms (e.g., wheelchair or bedside chair)?: A Lot Help needed to walk in hospital  room?: Total Help needed climbing 3-5 steps with a railing? : Total 6 Click Score: 12    End of Session Equipment Utilized During Treatment: Gait belt Activity Tolerance: Patient tolerated treatment well Patient left: in bed;with call bell/phone within reach;with bed alarm set Nurse Communication: Mobility status PT Visit Diagnosis: Other abnormalities of gait and mobility (R26.89);Muscle weakness (generalized) (M62.81);Pain Pain - Right/Left: Right Pain - part of body: Knee    Time: 1610-9604 PT Time Calculation (min) (ACUTE ONLY): 24 min   Charges:   PT Evaluation $PT Eval Low Complexity: 1 Low   PT General Charges $$ ACUTE PT VISIT: 1 Visit         Rexie Catena, PT, DPT Acute Rehabilitation Office [phone]   Rexie Catena 01/15/2024, 6:10 PM

## 2024-01-15 NOTE — Anesthesia Preprocedure Evaluation (Signed)
 Anesthesia Evaluation  Patient identified by MRN, date of birth, ID band Patient awake    Reviewed: Allergy & Precautions, NPO status , Patient's Chart, lab work & pertinent test results, reviewed documented beta blocker date and time   History of Anesthesia Complications (+) PONV and history of anesthetic complications  Airway Mallampati: III  TM Distance: >3 FB     Dental no notable dental hx.    Pulmonary shortness of breath and with exertion, sleep apnea , pneumonia   breath sounds clear to auscultation       Cardiovascular hypertension, (-) angina (-) CAD and (-) Past MI + dysrhythmias Atrial Fibrillation  Rhythm:Regular Rate:Bradycardia     Neuro/Psych neg Seizures PSYCHIATRIC DISORDERS Anxiety Depression   Dementia  Neuromuscular disease    GI/Hepatic hiatal hernia, PUD,GERD  Medicated and Controlled,,(+) neg Cirrhosis        Endo/Other  Hypothyroidism    Renal/GU Renal disease     Musculoskeletal  (+) Arthritis , Osteoarthritis,    Abdominal   Peds  Hematology   Anesthesia Other Findings   Reproductive/Obstetrics                             Anesthesia Physical Anesthesia Plan  ASA: 3  Anesthesia Plan: Spinal   Post-op Pain Management: Regional block*   Induction: Intravenous  PONV Risk Score and Plan: 3 and Propofol  infusion and Ondansetron   Airway Management Planned: Natural Airway and Simple Face Mask  Additional Equipment:   Intra-op Plan:   Post-operative Plan:   Informed Consent: I have reviewed the patients History and Physical, chart, labs and discussed the procedure including the risks, benefits and alternatives for the proposed anesthesia with the patient or authorized representative who has indicated his/her understanding and acceptance.     Dental advisory given  Plan Discussed with: CRNA  Anesthesia Plan Comments:        Anesthesia Quick  Evaluation

## 2024-01-16 ENCOUNTER — Encounter (HOSPITAL_COMMUNITY): Payer: Self-pay | Admitting: Orthopaedic Surgery

## 2024-01-16 LAB — BASIC METABOLIC PANEL WITH GFR
Anion gap: 8 (ref 5–15)
BUN: 15 mg/dL (ref 8–23)
CO2: 27 mmol/L (ref 22–32)
Calcium: 8.7 mg/dL — ABNORMAL LOW (ref 8.9–10.3)
Chloride: 105 mmol/L (ref 98–111)
Creatinine, Ser: 0.62 mg/dL (ref 0.44–1.00)
GFR, Estimated: >60 mL/min (ref >60)
Glucose, Bld: 133 mg/dL — ABNORMAL HIGH (ref 70–99)
Potassium: 4.3 mmol/L (ref 3.5–5.1)
Sodium: 140 mmol/L (ref 135–145)

## 2024-01-16 LAB — CBC
HCT: 35.2 % — ABNORMAL LOW (ref 36.0–46.0)
Hemoglobin: 11.8 g/dL — ABNORMAL LOW (ref 12.0–15.0)
MCH: 30.3 pg (ref 26.0–34.0)
MCHC: 33.5 g/dL (ref 30.0–36.0)
MCV: 90.5 fL (ref 80.0–100.0)
Platelets: 183 10*3/uL (ref 150–400)
RBC: 3.89 MIL/uL (ref 3.87–5.11)
RDW: 13.2 % (ref 11.5–15.5)
WBC: 14.1 10*3/uL — ABNORMAL HIGH (ref 4.0–10.5)
nRBC: 0 % (ref 0.0–0.2)

## 2024-01-16 MED ORDER — ALPRAZOLAM 0.25 MG PO TABS: 0.2500 mg | ORAL_TABLET | Freq: Two times a day (BID) | ORAL | Status: DC | PRN

## 2024-01-16 NOTE — Progress Notes (Signed)
 Physical Therapy Treatment Patient Details Name: Lisa Acosta MRN: [id number] DOB: September 21, 1950 Today's Date: 01/16/2024   History of Present Illness 73 y.o. female presents to [facility] hospital on 01/15/2024 for elective R TKA. PMH includes HTN, HLD, PAF, GAD, MDD, sarcoidosis.    PT Comments  Patient resting in bed at start of session and agreeable to mobilize with therapy. Pt reporting 7-8/10 for pain and RN notified of request for pain meds. Min assist with extra time and step by step cues required for bed mobility. Pt educated on WBAT for Rt LE and use of RW to assist with pressure relief and support on transfers. Pt required mod assist to power up and guarding at Rt knee to maintain extension. Pt required min-mod assist for small steps to move to recliner. Good use of Ue's to support Rt knee and guarding but no assist to knee to prevent buckling. Pt ended in recliner and exercises for ROM and circulation reviewed. Further gait limited by pain. Will continue to progress pt as able in acute setting.      If plan is discharge home, recommend the following: A little help with walking and/or transfers;A little help with bathing/dressing/bathroom;Assistance with cooking/housework;Assist for transportation;Help with stairs or ramp for entrance   Can travel by private vehicle        Equipment Recommendations  None recommended by PT    Recommendations for Other Services       Precautions / Restrictions Precautions Precautions: Fall;Knee Precaution Booklet Issued: Yes (comment) Recall of Precautions/Restrictions: Intact Restrictions Weight Bearing Restrictions Per Provider Order: Yes RLE Weight Bearing Per Provider Order: Weight bearing as tolerated     Mobility  Bed Mobility Overal bed mobility: Needs Assistance Bed Mobility: Supine to Sit, Sit to Supine     Supine to sit: Min assist, HOB elevated, Used rails Sit to supine: Min assist, HOB elevated   General bed mobility comments:  assist for Rt LE off EOB, pt using bed fetaures and extra time.    Transfers Overall transfer level: Needs assistance Equipment used: Rolling walker (2 wheels) Transfers: Sit to/from Stand Sit to Stand: Mod assist           General transfer comment: Mod assist to initiate stand, max cues for safe hand placement and encouragement. pt anxious.    Ambulation/Gait Ambulation/Gait assistance: Min assist, Mod assist Gait Distance (Feet): 6 Feet Assistive device: Rolling walker (2 wheels) Gait Pattern/deviations: Step-to pattern Gait velocity: reduced     General Gait Details: pt limited by pain and anxiety. Pt required max cues for sequencing and use of UE to reduce weight on Rt LE. Manual guarding/blocking on Rt LE in stance phase. assist to progress walker forward and pivot to recliner.   Stairs             Wheelchair Mobility     Tilt Bed    Modified Rankin (Stroke Patients Only)       Balance Overall balance assessment: Needs assistance Sitting-balance support: No upper extremity supported, Feet supported Sitting balance-Leahy Scale: Good     Standing balance support: Bilateral upper extremity supported, Reliant on assistive device for balance Standing balance-Leahy Scale: Poor                              Communication Communication Communication: No apparent difficulties  Cognition Arousal: Alert Behavior During Therapy: Anxious, WFL for tasks assessed/performed   PT - Cognitive impairments: No apparent impairments  Following commands: Intact      Cueing Cueing Techniques: Verbal cues  Exercises Total Joint Exercises Ankle Circles/Pumps: AROM, Both, 10 reps, Seated Quad Sets: AROM, Right, 5 reps Heel Slides: AAROM, Right, 5 reps    General Comments        Pertinent Vitals/Pain Pain Assessment Pain Assessment: 0-10 Pain Score: 7  Pain Location: R knee Pain Descriptors / Indicators:  Sore Pain Intervention(s): Limited activity within patient's tolerance, Monitored during session, Repositioned    Home Living                          Prior Function            PT Goals (current goals can now be found in the care plan section) Acute Rehab PT Goals Patient Stated Goal: to return to independence PT Goal Formulation: With patient Time For Goal Achievement: 01/19/24 Potential to Achieve Goals: Good Progress towards PT goals: Progressing toward goals    Frequency    7X/week      PT Plan      Co-evaluation              AM-PAC PT "6 Clicks" Mobility   Outcome Measure  Help needed turning from your back to your side while in a flat bed without using bedrails?: A Little Help needed moving from lying on your back to sitting on the side of a flat bed without using bedrails?: A Little Help needed moving to and from a bed to a chair (including a wheelchair)?: A Lot Help needed standing up from a chair using your arms (e.g., wheelchair or bedside chair)?: A Lot Help needed to walk in hospital room?: Total Help needed climbing 3-5 steps with a railing? : Total 6 Click Score: 12    End of Session Equipment Utilized During Treatment: Gait belt Activity Tolerance: Patient tolerated treatment well Patient left: in bed;with call bell/phone within reach;with bed alarm set Nurse Communication: Mobility status PT Visit Diagnosis: Other abnormalities of gait and mobility (R26.89);Muscle weakness (generalized) (M62.81);Pain Pain - Right/Left: Right Pain - part of body: Knee     Time: 1104-1130 PT Time Calculation (min) (ACUTE ONLY): 26 min  Charges:    $Gait Training: 8-22 mins $Therapeutic Exercise: 8-22 mins PT General Charges $$ ACUTE PT VISIT: 1 Visit                    Tish Forge, DPT Acute Rehabilitation Services Office [phone]  01/16/24 3:32 PM

## 2024-01-16 NOTE — Anesthesia Postprocedure Evaluation (Signed)
 Anesthesia Post Note  Patient: Lisa Acosta  Procedure(s) Performed: ARTHROPLASTY, KNEE, TOTAL (Right: Knee)     Patient location during evaluation: PACU Anesthesia Type: Spinal Level of consciousness: awake and alert Pain management: pain level controlled Vital Signs Assessment: post-procedure vital signs reviewed and stable Respiratory status: spontaneous breathing, nonlabored ventilation, respiratory function stable and patient connected to nasal cannula oxygen  Cardiovascular status: blood pressure returned to baseline and stable Postop Assessment: no apparent nausea or vomiting Anesthetic complications: no   No notable events documented.  Last Vitals:  Vitals:   01/16/24 0431 01/16/24 0731  BP: (!) 121/59 (!) 117/54  Pulse: 65 (!) 56  Resp: 18 18  Temp: 36.8 C 36.5 C  SpO2: 96% 98%    Last Pain:  Vitals:   01/16/24 0731  TempSrc: Oral  PainSc:                  [name]

## 2024-01-16 NOTE — Plan of Care (Signed)

## 2024-01-16 NOTE — Progress Notes (Signed)
 Physical Therapy Treatment Patient Details Name: Lisa Acosta MRN: [id number] DOB: [birth year] Today's Date: 01/16/2024   History of Present Illness 73 y.o. female presents to [facility] hospital on 01/15/2024 for elective R TKA. PMH includes HTN, HLD, PAF, GAD, MDD, sarcoidosis.    PT Comments  Pt in recliner at start of session and reporting increased pain and noted to be anxious with slight tremor. Pt required mod assist for sit<>stand to power up from recliner and cues for use of RW to support Rt LE. Knee flexing in stance and manual assist to maintain extension with steps towards BSC to void bladder. Pt required Mod assist to stand and min-mod with RW to take small steps to EOB. Pt very limited by pain and anxious with gait. Pt expressed stress regarding fear of falling or harming her knee despite encouragement and education on stability. Min assist to return to supine in bed and encouraged pt to participate in HEP for ROM and circulation. Ice applied for pain management and educated on wear time. Discussed goals of mobilizing with therapy and expected progress/recovery of TKA. Will continue to progress pt as able during acute stay.     If plan is discharge home, recommend the following: A little help with walking and/or transfers;A little help with bathing/dressing/bathroom;Assistance with cooking/housework;Assist for transportation;Help with stairs or ramp for entrance   Can travel by private vehicle        Equipment Recommendations  None recommended by PT    Recommendations for Other Services       Precautions / Restrictions Precautions Precautions: Fall;Knee Precaution Booklet Issued: Yes (comment) Recall of Precautions/Restrictions: Intact Restrictions Weight Bearing Restrictions Per Provider Order: Yes RLE Weight Bearing Per Provider Order: Weight bearing as tolerated     Mobility  Bed Mobility Overal bed mobility: Needs Assistance Bed Mobility: Sit to Supine       Sit to  supine: Min assist, HOB elevated   General bed mobility comments: assist required to raise LE's onto bed and reposition    Transfers Overall transfer level: Needs assistance Equipment used: Rolling walker (2 wheels) Transfers: Sit to/from Stand, Bed to chair/wheelchair/BSC Sit to Stand: Mod assist   Step pivot transfers: Mod assist       General transfer comment: Mod assist to rise from recliner, Rt LE flexing on stance and manual assist to prevent buckling on initial stance and rise. Mod assist to guide walker and block Rt LE during turn.    Ambulation/Gait Ambulation/Gait assistance: Min assist, Mod assist Gait Distance (Feet): 6 Feet Assistive device: Rolling walker (2 wheels) Gait Pattern/deviations: Step-to pattern Gait velocity: reduced     General Gait Details: min-mod assist to sequence small steps forward to move BSC>EOB, pt fluctuating with ability to support Rt knee with UE use on RW.   Stairs             Wheelchair Mobility     Tilt Bed    Modified Rankin (Stroke Patients Only)       Balance Overall balance assessment: Needs assistance Sitting-balance support: No upper extremity supported, Feet supported Sitting balance-Leahy Scale: Good     Standing balance support: Bilateral upper extremity supported, Reliant on assistive device for balance Standing balance-Leahy Scale: Poor                              Communication Communication Communication: No apparent difficulties  Cognition Arousal: Alert Behavior During Therapy: Anxious, WFL for  tasks assessed/performed   PT - Cognitive impairments: No apparent impairments                         Following commands: Intact      Cueing Cueing Techniques: Verbal cues  Exercises Total Joint Exercises Ankle Circles/Pumps: AROM, Both, 10 reps, Seated Quad Sets: AROM, Right, 5 reps Heel Slides: AAROM, Right, 5 reps    General Comments        Pertinent Vitals/Pain  Pain Assessment Pain Assessment: 0-10 Pain Score: 8  Pain Location: R knee Pain Descriptors / Indicators: Sore Pain Intervention(s): Limited activity within patient's tolerance, Monitored during session, Ice applied, Repositioned    Home Living                          Prior Function            PT Goals (current goals can now be found in the care plan section) Acute Rehab PT Goals Patient Stated Goal: to return to independence PT Goal Formulation: With patient Time For Goal Achievement: 01/19/24 Potential to Achieve Goals: Good Progress towards PT goals: Progressing toward goals    Frequency    7X/week      PT Plan      Co-evaluation              AM-PAC PT "6 Clicks" Mobility   Outcome Measure  Help needed turning from your back to your side while in a flat bed without using bedrails?: A Little Help needed moving from lying on your back to sitting on the side of a flat bed without using bedrails?: A Little Help needed moving to and from a bed to a chair (including a wheelchair)?: A Lot Help needed standing up from a chair using your arms (e.g., wheelchair or bedside chair)?: A Lot Help needed to walk in hospital room?: Total Help needed climbing 3-5 steps with a railing? : Total 6 Click Score: 12    End of Session Equipment Utilized During Treatment: Gait belt Activity Tolerance: Patient tolerated treatment well Patient left: in bed;with call bell/phone within reach;with bed alarm set Nurse Communication: Mobility status PT Visit Diagnosis: Other abnormalities of gait and mobility (R26.89);Muscle weakness (generalized) (M62.81);Pain Pain - Right/Left: Right Pain - part of body: Knee     Time: 1610-9604 PT Time Calculation (min) (ACUTE ONLY): 44 min  Charges:    $Gait Training: 8-22 mins $Therapeutic Activity: 23-37 mins PT General Charges $$ ACUTE PT VISIT: 1 Visit                     Tish Forge, DPT Acute Rehabilitation  Services Office [phone]  01/16/24 3:40 PM

## 2024-01-16 NOTE — Care Management Obs Status (Signed)
 MEDICARE OBSERVATION STATUS NOTIFICATION   Patient Details  Name: Lisa Acosta MRN: [id number] Date of Birth: 05-22-1951   Medicare Observation Status Notification Given:  Yes    Omie Bickers, RN 01/16/2024, 3:20 PM

## 2024-01-16 NOTE — Progress Notes (Signed)
 Subjective: 1 Day Post-Op Procedure(s) (LRB): ARTHROPLASTY, KNEE, TOTAL (Right) Patient reports pain as moderate.    Objective: Vital signs in last 24 hours: Temp:  [97.4 F (36.3 C)-98.2 F (36.8 C)] 98.2 F (36.8 C) (05/07 0431) Pulse Rate:  [47-66] 65 (05/07 0431) Resp:  [12-18] 18 (05/07 0431) BP: (110-135)/(51-103) 121/59 (05/07 0431) SpO2:  [92 %-97 %] 96 % (05/07 0431) Weight:  [79.4 kg] 79.4 kg (05/06 0937)  Intake/Output from previous day: 05/06 0701 - 05/07 0700 In: 1557.5 [I.V.:1060.5; IV Piggyback:497] Out: 1650 [Urine:1575; Blood:75] Intake/Output this shift: No intake/output data recorded.  Recent Labs    01/16/24 0520  HGB 11.8*   Recent Labs    01/16/24 0520  WBC 14.1*  RBC 3.89  HCT 35.2*  PLT 183   Recent Labs    01/16/24 0520  NA 140  K 4.3  CL 105  CO2 27  BUN 15  CREATININE 0.62  GLUCOSE 133*  CALCIUM  8.7*   No results for input(s): "LABPT", "INR" in the last 72 hours.  Sensation intact distally Intact pulses distally Dorsiflexion/Plantar flexion intact Incision: dressing C/D/I Compartment soft   Assessment/Plan: 1 Day Post-Op Procedure(s) (LRB): ARTHROPLASTY, KNEE, TOTAL (Right) Up with therapy      Arnie Lao 01/16/2024, 7:27 AM

## 2024-01-16 NOTE — TOC Transition Note (Signed)
 Transition of Care [facility]) - Discharge Note   Patient Details  Name: Lisa Acosta MRN: [id number] Date of Birth: Feb 08, 1951  Transition of Care [facility]) CM/SW Contact:  Omie Bickers, RN Phone Number: 01/16/2024, 10:39 AM   Clinical Narrative:     [name] w patient at bedside.  She will return home at DC. HH set up from office through [facility], Renetta Carter notified of DC (likely tomorrow).  Patient states that she has DME RW, shower seat, and nocturnal oxygen  at home.   Final next level of care: Home/Self Care Barriers to Discharge: No Barriers Identified   Patient Goals and CMS Choice Patient states their goals for this hospitalization and ongoing recovery are:: to go home CMS Medicare.gov Compare Post Acute Care list provided to:: Patient Choice offered to / list presented to : Patient      Discharge Placement                       Discharge Plan and Services Additional resources added to the After Visit Summary for                  DME Arranged: N/A         HH Arranged: PT, OT HH Agency: [facility] ([facility])        Social Drivers of Health (SDOH) Interventions SDOH Screenings   Food Insecurity: No Food Insecurity (01/15/2024)  Housing: Low Risk  (01/15/2024)  Transportation Needs: No Transportation Needs (01/15/2024)  Utilities: Not At Risk (01/15/2024)  Alcohol Screen: Low Risk  (10/27/2021)  Depression (PHQ2-9): High Risk (05/09/2023)  Financial Resource Strain: Low Risk  (10/27/2021)  Physical Activity: Inactive (09/26/2018)  Social Connections: Socially Integrated (01/15/2024)  Stress: No Stress Concern Present (10/27/2021)  Tobacco Use: Medium Risk (01/15/2024)     Readmission Risk Interventions     No data to display

## 2024-01-17 DIAGNOSIS — K760 Fatty (change of) liver, not elsewhere classified: Secondary | ICD-10-CM | POA: Diagnosis present

## 2024-01-17 DIAGNOSIS — I10 Essential (primary) hypertension: Secondary | ICD-10-CM | POA: Diagnosis present

## 2024-01-17 DIAGNOSIS — Z79890 Hormone replacement therapy (postmenopausal): Secondary | ICD-10-CM | POA: Diagnosis not present

## 2024-01-17 DIAGNOSIS — M1711 Unilateral primary osteoarthritis, right knee: Secondary | ICD-10-CM | POA: Diagnosis present

## 2024-01-17 DIAGNOSIS — R7303 Prediabetes: Secondary | ICD-10-CM | POA: Diagnosis present

## 2024-01-17 DIAGNOSIS — E785 Hyperlipidemia, unspecified: Secondary | ICD-10-CM | POA: Diagnosis present

## 2024-01-17 DIAGNOSIS — F411 Generalized anxiety disorder: Secondary | ICD-10-CM | POA: Diagnosis present

## 2024-01-17 DIAGNOSIS — F0394 Unspecified dementia, unspecified severity, with anxiety: Secondary | ICD-10-CM | POA: Diagnosis present

## 2024-01-17 DIAGNOSIS — I480 Paroxysmal atrial fibrillation: Secondary | ICD-10-CM | POA: Diagnosis present

## 2024-01-17 DIAGNOSIS — E039 Hypothyroidism, unspecified: Secondary | ICD-10-CM | POA: Diagnosis present

## 2024-01-17 DIAGNOSIS — Z818 Family history of other mental and behavioral disorders: Secondary | ICD-10-CM | POA: Diagnosis not present

## 2024-01-17 DIAGNOSIS — Z7901 Long term (current) use of anticoagulants: Secondary | ICD-10-CM | POA: Diagnosis not present

## 2024-01-17 DIAGNOSIS — Z83438 Family history of other disorder of lipoprotein metabolism and other lipidemia: Secondary | ICD-10-CM | POA: Diagnosis not present

## 2024-01-17 DIAGNOSIS — Z811 Family history of alcohol abuse and dependence: Secondary | ICD-10-CM | POA: Diagnosis not present

## 2024-01-17 DIAGNOSIS — Z8349 Family history of other endocrine, nutritional and metabolic diseases: Secondary | ICD-10-CM | POA: Diagnosis not present

## 2024-01-17 DIAGNOSIS — Z823 Family history of stroke: Secondary | ICD-10-CM | POA: Diagnosis not present

## 2024-01-17 DIAGNOSIS — Z90710 Acquired absence of both cervix and uterus: Secondary | ICD-10-CM | POA: Diagnosis not present

## 2024-01-17 DIAGNOSIS — Z7983 Long term (current) use of bisphosphonates: Secondary | ICD-10-CM | POA: Diagnosis not present

## 2024-01-17 DIAGNOSIS — G4733 Obstructive sleep apnea (adult) (pediatric): Secondary | ICD-10-CM | POA: Diagnosis present

## 2024-01-17 DIAGNOSIS — Z8249 Family history of ischemic heart disease and other diseases of the circulatory system: Secondary | ICD-10-CM | POA: Diagnosis not present

## 2024-01-17 NOTE — Progress Notes (Signed)
 Physical Therapy Treatment Patient Details Name: Lisa Acosta MRN: [id number] DOB: 03-05-1951 Today's Date: 01/17/2024   History of Present Illness 73 y.o. female presents to [facility] hospital on 01/15/2024 for elective R TKA. PMH includes HTN, HLD, PAF, GAD, MDD, sarcoidosis.    PT Comments  Pt resting in bed and more confused this date. Pt aware she had a knee replacement and she is in hospital but referring to the [facility] as an "AI human" and having difficulty following cues to sequence mobility. Mod-max assist required for supine>sit with assist for Rt LE and to obtain seated balance initially. Bed pad to scoot hips anteriorly and stabilize on floor, CGA for safety with sitting. Pt c/o need to void bladder. Max Assist attempted for sit<>stand with RW, pt unable to rise and poor following of cues for hand placement. Max+2 assist with [name] utilized for sit<>stand and dependent transfer bed>chair. In recliner reviewed exercises for ROM and pt limited by pain. AAROM required for all exercises. Will continue to progress as able. Patient will benefit from continued inpatient follow up therapy, <3 hours/day.    If plan is discharge home, recommend the following: A little help with walking and/or transfers;A little help with bathing/dressing/bathroom;Assistance with cooking/housework;Assist for transportation;Help with stairs or ramp for entrance   Can travel by private vehicle     No  Equipment Recommendations  None recommended by PT    Recommendations for Other Services       Precautions / Restrictions Precautions Precautions: Fall;Knee Precaution Booklet Issued: Yes (comment) Recall of Precautions/Restrictions: Intact Restrictions Weight Bearing Restrictions Per Provider Order: Yes RLE Weight Bearing Per Provider Order: Weight bearing as tolerated     Mobility  Bed Mobility Overal bed mobility: Needs Assistance Bed Mobility: Supine to Sit     Supine to sit: HOB elevated, Mod assist,  Used rails, Max assist Sit to supine: HOB elevated, Used rails, Max assist   General bed mobility comments: Mod-max assist for supine>sit. Pt required step by step cues to pivot supine to sit. Extra time to obtain seated balance at EOB.    Transfers Overall transfer level: Needs assistance Equipment used: Rolling walker (2 wheels), Ambulation equipment used Transfers: Sit to/from Stand, Bed to chair/wheelchair/BSC Sit to Stand: Max assist, +2 safety/equipment, +2 physical assistance, From elevated surface   Step pivot transfers: Max assist, +2 physical assistance, +2 safety/equipment, From elevated surface       General transfer comment: Pt with poor ability to follow cues for hand placement/technique and unable to stand with Max assist. Max+2 to rise to RW and pt unable to attempt steps despite knee immobilizer in place for Rt LE support. Max +2 with Stedy for sit<>stand and dependent transfer bed>recliner. Transfer via Lift Equipment: Stedy  Ambulation/Gait               General Gait Details: unable this session   Stairs             Wheelchair Mobility     Tilt Bed    Modified Rankin (Stroke Patients Only)       Balance Overall balance assessment: Needs assistance Sitting-balance support: No upper extremity supported, Feet supported Sitting balance-Leahy Scale: Good     Standing balance support: Bilateral upper extremity supported, Reliant on assistive device for balance Standing balance-Leahy Scale: Poor                              Communication Communication  Communication: No apparent difficulties  Cognition Arousal: Alert Behavior During Therapy: Anxious, WFL for tasks assessed/performed   PT - Cognitive impairments: Awareness, Sequencing, Initiation, Problem solving, Safety/Judgement, Other                       PT - Cognition Comments: pt looking at [facility] asking "is that an AI human?" Following commands: Intact       Cueing Cueing Techniques: Verbal cues  Exercises Total Joint Exercises Ankle Circles/Pumps: AROM, Both, 20 reps, Seated Quad Sets: AROM, AAROM, Right, 5 reps, Seated (poor activation) Heel Slides: AAROM, Right, 10 reps    General Comments        Pertinent Vitals/Pain Pain Assessment Pain Assessment: 0-10 Pain Location: R knee Pain Descriptors / Indicators: Sore Pain Intervention(s): Limited activity within patient's tolerance, Monitored during session, Repositioned    Home Living                          Prior Function            PT Goals (current goals can now be found in the care plan section) Acute Rehab PT Goals Patient Stated Goal: to return to independence PT Goal Formulation: With patient Time For Goal Achievement: 01/19/24 Potential to Achieve Goals: Good Progress towards PT goals: Progressing toward goals (slow)    Frequency    7X/week      PT Plan      Co-evaluation              AM-PAC PT "6 Clicks" Mobility   Outcome Measure  Help needed turning from your back to your side while in a flat bed without using bedrails?: A Little Help needed moving from lying on your back to sitting on the side of a flat bed without using bedrails?: A Lot Help needed moving to and from a bed to a chair (including a wheelchair)?: Total Help needed standing up from a chair using your arms (e.g., wheelchair or bedside chair)?: Total Help needed to walk in hospital room?: Total Help needed climbing 3-5 steps with a railing? : Total 6 Click Score: 9    End of Session Equipment Utilized During Treatment: Gait belt Activity Tolerance: Patient tolerated treatment well Patient left: in bed;with call bell/phone within reach;with bed alarm set Nurse Communication: Mobility status PT Visit Diagnosis: Other abnormalities of gait and mobility (R26.89);Muscle weakness (generalized) (M62.81);Pain Pain - Right/Left: Right Pain - part of body: Knee     Time:  0938-1829 PT Time Calculation (min) (ACUTE ONLY): 32 min  Charges:    $Therapeutic Exercise: 8-22 mins $Therapeutic Activity: 8-22 mins PT General Charges $$ ACUTE PT VISIT: 1 Visit                     Tish Forge, DPT Acute Rehabilitation Services Office [phone]  01/17/24 1:25 PM

## 2024-01-17 NOTE — Progress Notes (Signed)
 Physical Therapy Treatment Patient Details Name: Lisa Acosta MRN: [id number] DOB: Sep 01, 1951 Today's Date: 01/17/2024   History of Present Illness 73 y.o. female presents to [facility] hospital on 01/15/2024 for elective R TKA. PMH includes HTN, HLD, PAF, GAD, MDD, sarcoidosis.    PT Comments  Patient resting in recliner, reporting pain but recently medicated. Session initiated with AAROM/AROM exercises to reduce stiffness in Rt knee and progress mobility. Pt required verbal/tactile cues for all exercises. Cues for use of belt to prevent quick lowering of Rt LE from recliner leg rest. HEAVY Max Assist for sit<>stand to RW from recliner, pt incontinent of bladder and returned to sitting. Max +2 assist with [name] provided for second stand and dependent transfer chair>EOB. Pt returned to supine max +2 and boosted with reverse trendelenburg and pt assisting with bil UE to pull up. EOS retrograde edema massage performed for Rt knee to manage pain and swelling. Will continue to progress pt as able. Patient will benefit from continued inpatient follow up therapy, <3 hours/day.  Pt is noted to have continued confusion and daughter present expressing concerns. Discussed that medical and surgical team aware of confusion and monitoring. Pt has been confused on where she is and some possible hallucinations (referring to [facility] at AI human in AM session). Will continue to monitor.     If plan is discharge home, recommend the following: A little help with walking and/or transfers;A little help with bathing/dressing/bathroom;Assistance with cooking/housework;Assist for transportation;Help with stairs or ramp for entrance   Can travel by private vehicle     No  Equipment Recommendations  None recommended by PT    Recommendations for Other Services       Precautions / Restrictions Precautions Precautions: Fall;Knee Precaution Booklet Issued: Yes (comment) Recall of Precautions/Restrictions:  Intact Restrictions Weight Bearing Restrictions Per Provider Order: Yes RLE Weight Bearing Per Provider Order: Weight bearing as tolerated     Mobility  Bed Mobility Overal bed mobility: Needs Assistance Bed Mobility: Sit to Supine     Supine to sit: HOB elevated, Mod assist, Used rails, Max assist Sit to supine: HOB elevated, Used rails, Max assist   General bed mobility comments: Max Assist +2 to control lowering trunk and bringing bil LE's up    Transfers Overall transfer level: Needs assistance Equipment used: Rolling walker (2 wheels), Ambulation equipment used Transfers: Sit to/from Stand, Bed to chair/wheelchair/BSC Sit to Stand: Max assist, +2 safety/equipment, +2 physical assistance, From elevated surface   Step pivot transfers: Max assist, +2 physical assistance, +2 safety/equipment, From elevated surface       General transfer comment: HEAVY MAX Assist for sit<>stand with RW from recliner. pt limited by stress incontinence voiding bladder in standing and returned to sit. Patient required Stedy with max +2 for sit<>stand and dependent transfer chair>bed. Transfer via Lift Equipment: Stedy  Ambulation/Gait               General Gait Details: unable this session   Stairs             Wheelchair Mobility     Tilt Bed    Modified Rankin (Stroke Patients Only)       Balance Overall balance assessment: Needs assistance Sitting-balance support: No upper extremity supported, Feet supported Sitting balance-Leahy Scale: Good     Standing balance support: Bilateral upper extremity supported, Reliant on assistive device for balance Standing balance-Leahy Scale: Poor  Communication Communication Communication: No apparent difficulties  Cognition Arousal: Alert Behavior During Therapy: Anxious, WFL for tasks assessed/performed   PT - Cognitive impairments: Awareness, Sequencing, Initiation, Problem solving,  Safety/Judgement, Other                       PT - Cognition Comments: pt looking at [facility] asking "is that an AI human?" Following commands: Intact      Cueing Cueing Techniques: Verbal cues  Exercises Total Joint Exercises Ankle Circles/Pumps: AROM, Both, 20 reps, Seated Quad Sets: AROM, AAROM, Right, Seated, 15 reps Short Arc Quad: AROM, AAROM, Right, Seated, 10 reps Heel Slides: AAROM, Right, 10 reps Hip ABduction/ADduction: AROM, AAROM, Right, Seated, 10 reps Long Arc Quad: AROM, AAROM, Right, Seated, 10 reps, Limitations Long Arc Quad Limitations: limited/poor ROM Knee Flexion: AROM, Right, 10 reps, Seated, Limitations Knee Flexion Limitations: knee bend (towel under foot) gentle assist from therapist Other Exercises Other Exercises: retrograde massafe for edema and pain control    General Comments        Pertinent Vitals/Pain Pain Assessment Pain Assessment: Faces Faces Pain Scale: Hurts even more Pain Location: R knee Pain Descriptors / Indicators: Sore Pain Intervention(s): Limited activity within patient's tolerance, Monitored during session, Repositioned, Premedicated before session, Other (comment) (retrograde edema massage)    Home Living                          Prior Function            PT Goals (current goals can now be found in the care plan section) Acute Rehab PT Goals Patient Stated Goal: to return to independence PT Goal Formulation: With patient Time For Goal Achievement: 01/19/24 Potential to Achieve Goals: Good Progress towards PT goals: Not progressing toward goals - comment (pain and weakness limiting, new confusion)    Frequency    7X/week      PT Plan      Co-evaluation              AM-PAC PT "6 Clicks" Mobility   Outcome Measure  Help needed turning from your back to your side while in a flat bed without using bedrails?: A Little Help needed moving from lying on your back to sitting on the side of a  flat bed without using bedrails?: A Lot Help needed moving to and from a bed to a chair (including a wheelchair)?: Total Help needed standing up from a chair using your arms (e.g., wheelchair or bedside chair)?: Total Help needed to walk in hospital room?: Total Help needed climbing 3-5 steps with a railing? : Total 6 Click Score: 9    End of Session Equipment Utilized During Treatment: Gait belt Activity Tolerance: Patient tolerated treatment well Patient left: in bed;with call bell/phone within reach;with bed alarm set Nurse Communication: Mobility status PT Visit Diagnosis: Other abnormalities of gait and mobility (R26.89);Muscle weakness (generalized) (M62.81);Pain Pain - Right/Left: Right Pain - part of body: Knee     Time: 4098-1191 PT Time Calculation (min) (ACUTE ONLY): 49 min  Charges:    $Therapeutic Exercise: 8-22 mins $Therapeutic Activity: 8-22 mins $Massage: 8-22 mins PT General Charges $$ ACUTE PT VISIT: 1 Visit                     Tish Forge, DPT Acute Rehabilitation Services Office [phone]  01/17/24 4:30 PM

## 2024-01-17 NOTE — Plan of Care (Signed)
 Pt has been confused today more than usual.   Pain meds only given once around 1300 r/t 10/10 pain.  Therapy had set pt in chair and pt stated 10/10 pain to right leg.  Oxy given once today. Tolerated well.    Problem: Education: Goal: Knowledge of General Education information will improve Description: Including pain rating scale, medication(s)/side effects and non-pharmacologic comfort measures Outcome: Progressing   Problem: Health Behavior/Discharge Planning: Goal: Ability to manage health-related needs will improve Outcome: Progressing   Problem: Clinical Measurements: Goal: Ability to maintain clinical measurements within normal limits will improve Outcome: Progressing Goal: Will remain free from infection Outcome: Progressing Goal: Diagnostic test results will improve Outcome: Progressing Goal: Respiratory complications will improve Outcome: Progressing Goal: Cardiovascular complication will be avoided Outcome: Progressing   Problem: Activity: Goal: Risk for activity intolerance will decrease Outcome: Progressing   Problem: Nutrition: Goal: Adequate nutrition will be maintained Outcome: Progressing   Problem: Coping: Goal: Level of anxiety will decrease Outcome: Progressing   Problem: Elimination: Goal: Will not experience complications related to bowel motility Outcome: Progressing Goal: Will not experience complications related to urinary retention Outcome: Progressing   Problem: Pain Managment: Goal: General experience of comfort will improve and/or be controlled Outcome: Progressing   Problem: Safety: Goal: Ability to remain free from injury will improve Outcome: Progressing   Problem: Skin Integrity: Goal: Risk for impaired skin integrity will decrease Outcome: Progressing   Problem: Education: Goal: Knowledge of the prescribed therapeutic regimen will improve Outcome: Progressing Goal: Individualized Educational Video(s) Outcome: Progressing    Problem: Activity: Goal: Ability to avoid complications of mobility impairment will improve Outcome: Progressing Goal: Range of joint motion will improve Outcome: Progressing   Problem: Clinical Measurements: Goal: Postoperative complications will be avoided or minimized Outcome: Progressing   Problem: Pain Management: Goal: Pain level will decrease with appropriate interventions Outcome: Progressing   Problem: Skin Integrity: Goal: Will show signs of wound healing Outcome: Progressing

## 2024-01-17 NOTE — Progress Notes (Signed)
 Patient ID: Lisa Acosta, female   DOB: [birth year], 73 y.o.   MRN: [id number]  The patient is awake and alert this morning.  Her vital signs are stable and her right operative knee is stable.  The dressing is clean and dry.  She had very slow mobility with therapy yesterday and I did see their notes about her being anxious and I ordered a small dose of Xanax  for her.  She has not been up with therapy as of yet but it is early in the morning.  Will check on her later this morning prior to my afternoon clinic to see what progress she may have made in order to determine what her disposition will be in terms of going home this afternoon versus potentially even staying another day to maximize therapy.  I encouraged her to mobilize is much as possible with assistance.

## 2024-01-17 NOTE — Progress Notes (Signed)
 Patient ID: Lisa Acosta, female   DOB: 1951-07-10, 73 y.o.   MRN: [id number] I have switched the patient now to an inpatient admission from observation.  She is in the bedside chair now.  She is following commands appropriately but definitely shows some signs of confusion.  Some of this may be pain medication related.  She apparently had some anxiety yesterday and I had ordered some as needed Xanax .  I am going to discontinue that order now.  She shows no neurologic deficits but definitely has some confusion.  She has not been able to work well for mobility standpoint with PT as of yet and will likely need short-term skilled nursing following this hospitalization.  The transitional care team has been consulted.

## 2024-01-18 NOTE — NC FL2 (Signed)
 [place]  MEDICAID FL2 LEVEL OF CARE FORM     IDENTIFICATION  Patient Name: Lisa Acosta Birthdate: 10-Aug-1951 Sex: female Admission Date (Current Location): 01/15/2024  [facility] and [place] Number:  Producer, television/film/video and Address:  [facility], [address], [place], [place] [address]      Provider Number: [id number]  Attending Physician Name and Address:  Arnie Lao,*  Relative Name and Phone Number:  Jarvis Mesa (Daughter)  [phone] (Home Phone)    Current Level of Care: Hospital Recommended Level of Care: Skilled Nursing Facility Prior Approval Number:    Date Approved/Denied:   PASRR Number: [id number]  Discharge Plan: SNF    Current Diagnoses: Patient Active Problem List   Diagnosis Date Noted   Status post total right knee replacement 01/15/2024   Unilateral primary osteoarthritis, right knee 10/15/2023   Sarcoidosis 03/08/2023   High risk medication use 03/08/2023   Nocturnal hypoxemia 03/08/2023   Muscle pain 01/09/2023   Generalized obesity 10/12/2022   Class 2 severe obesity with serious comorbidity and body mass index (BMI) of 35.0 to 35.9 in adult [facility]) 08/15/2022   Lung infiltrate    Persistent pneumonia    Hilar adenopathy    Vitamin D  insufficiency 01/10/2021   Cognitive complaints with normal neuropsychological exam 07/29/2020   Memory difficulty 08/18/2019   Right wrist pain 05/04/2019   Mild episode of recurrent major depressive disorder (HCC) 12/11/2018   Depression, major, single episode, mild (HCC) 10/22/2018   Gait abnormality 10/14/2018   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   Moderate episode of recurrent major depressive disorder (HCC) 09/26/2018   Generalized anxiety disorder 09/26/2018   Osteoporosis 01/25/2018   Fatty liver 08/22/2017   Gallbladder sludge 08/22/2017   Upper abdominal pain 07/19/2017   Gastroesophageal reflux disease with esophagitis 06/01/2017    Encounter for monitoring flecainide  therapy 04/12/2017   Osteoarthritis of hands, 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   Dyspnea 09/22/2016   OSA (obstructive sleep apnea) 08/31/2016   PAF (paroxysmal atrial fibrillation) (HCC) 03/16/2016   Hypothyroid 03/16/2016   Obesity (BMI 30-39.9) 03/16/2016   Skin lesion of left upper extremity 02/05/2016   Essential hypertension 11/10/2015   Hyperlipidemia 11/10/2015   Acute gastritis 06/05/2014   Allergic rhinitis 06/05/2014   Arthritis 06/05/2014   Diaphragmatic hernia 06/05/2014   Difficulty swallowing 06/05/2014   Diverticulosis of colon 06/05/2014   Hyperglycemia 06/05/2014   Impaired fasting glucose 06/05/2014   Insomnia 06/05/2014   Lower back pain 06/05/2014   Osteoarthritis of shoulder 06/05/2014   Pain in joint, pelvic region and thigh 06/05/2014   Pain of right thumb 06/05/2014   Esophageal reflux 03/18/2014   Esophageal ulcer 03/18/2014   Anxiety state 11/25/2013    Orientation RESPIRATION BLADDER Height & Weight     Self  Normal Continent Weight: 175 lb (79.4 kg) Height:  5\' 1"  (154.9 cm)  BEHAVIORAL SYMPTOMS/MOOD NEUROLOGICAL BOWEL NUTRITION STATUS      Continent Diet (see d/c summary)  AMBULATORY STATUS COMMUNICATION OF NEEDS Skin   Extensive Assist Verbally Surgical wounds (incision right knee)                       Personal Care Assistance Level of Assistance  Bathing, Feeding, Dressing Bathing Assistance: Maximum assistance Feeding assistance: Independent Dressing Assistance: Maximum assistance     Functional Limitations Info  Sight, Hearing, Speech Sight Info: Adequate Hearing Info: Impaired Speech Info: Adequate    SPECIAL CARE FACTORS FREQUENCY  OT (By licensed  OT), PT (By licensed PT)     PT Frequency: 5x/week OT Frequency: 5x/week            Contractures Contractures Info: Not present    Additional Factors Info  Code Status, Allergies Code Status Info: full code Allergies Info: dextran, tree  extract, Penicillin G, penicillins, Wound Dressing Adhesive, sulfa antibiotics           Current Medications (01/18/2024):  This is the current hospital active medication list Current Facility-Administered Medications  Medication Dose Route Frequency Provider Last Rate Last Admin   acetaminophen  (TYLENOL ) tablet 325-650 mg  325-650 mg Oral Q6H PRN Arnie Lao, MD   650 mg at 01/17/24 0858   alum & mag hydroxide-simeth (MAALOX/MYLANTA) 200-200-20 MG/5ML suspension 30 mL  30 mL Oral Q4H PRN Arnie Lao, MD       amLODipine  (NORVASC ) tablet 5 mg  5 mg Oral Daily Arnie Lao, MD   5 mg at 01/18/24 8469   atenolol  (TENORMIN ) tablet 50 mg  50 mg Oral Daily Arnie Lao, MD   50 mg at 01/17/24 2141   diphenhydrAMINE  (BENADRYL ) 12.5 MG/5ML elixir 12.5-25 mg  12.5-25 mg Oral Q4H PRN Arnie Lao, MD       docusate sodium  (COLACE) capsule 100 mg  100 mg Oral BID Arnie Lao, MD   100 mg at 01/18/24 6295   flecainide  (TAMBOCOR ) tablet 100 mg  100 mg Oral BID Arnie Lao, MD   100 mg at 01/18/24 2841   folic acid  (FOLVITE ) tablet 2 mg  2 mg Oral Daily Arnie Lao, MD   2 mg at 01/18/24 3244   HYDROmorphone  (DILAUDID ) injection 0.5-1 mg  0.5-1 mg Intravenous Q4H PRN Arnie Lao, MD   1 mg at 01/16/24 2316   levothyroxine  (SYNTHROID ) tablet 125 mcg  125 mcg Oral Q0600 Arnie Lao, MD   125 mcg at 01/18/24 0102   menthol -cetylpyridinium (CEPACOL) lozenge 3 mg  1 lozenge Oral PRN Arnie Lao, MD       Or   phenol (CHLORASEPTIC) mouth spray 1 spray  1 spray Mouth/Throat PRN Arnie Lao, MD   1 spray at 01/16/24 0016   methocarbamol  (ROBAXIN ) tablet 500 mg  500 mg Oral Q6H PRN Arnie Lao, MD   500 mg at 01/18/24 7253   Or   methocarbamol  (ROBAXIN ) injection 500 mg  500 mg Intravenous Q6H PRN Arnie Lao, MD       metoCLOPramide  (REGLAN ) tablet  5-10 mg  5-10 mg Oral Q8H PRN Arnie Lao, MD       Or   metoCLOPramide  (REGLAN ) injection 5-10 mg  5-10 mg Intravenous Q8H PRN Arnie Lao, MD       ondansetron  (ZOFRAN ) tablet 4 mg  4 mg Oral Q6H PRN Arnie Lao, MD       Or   ondansetron  (ZOFRAN ) injection 4 mg  4 mg Intravenous Q6H PRN Arnie Lao, MD       Oral care mouth rinse  15 mL Mouth Rinse PRN Arnie Lao, MD       oxyCODONE  (Oxy IR/ROXICODONE ) immediate release tablet 10-15 mg  10-15 mg Oral Q4H PRN Arnie Lao, MD   10 mg at 01/18/24 1421   oxyCODONE  (Oxy IR/ROXICODONE ) immediate release tablet 5-10 mg  5-10 mg Oral Q4H PRN Arnie Lao, MD   10 mg at 01/15/24 1730   pantoprazole  (PROTONIX ) EC tablet 40 mg  40 mg Oral Daily Arnie Lao, MD   40 mg at 01/18/24 1610   polyvinyl alcohol  (LIQUIFILM TEARS) 1.4 % ophthalmic solution 1 drop  1 drop Both Eyes PRN Arnie Lao, MD       rivaroxaban  (XARELTO ) tablet 20 mg  20 mg Oral Q supper Arnie Lao, MD   20 mg at 01/17/24 1708   rosuvastatin  (CRESTOR ) tablet 20 mg  20 mg Oral Daily Arnie Lao, MD   20 mg at 01/18/24 9604   sertraline  (ZOLOFT ) tablet 25 mg  25 mg Oral BID Arnie Lao, MD   25 mg at 01/18/24 5409   zolpidem  (AMBIEN ) tablet 5 mg  5 mg Oral QHS PRN Arnie Lao, MD   5 mg at 01/16/24 2321     Discharge Medications: Please see discharge summary for a list of discharge medications.  Relevant Imaging Results:  Relevant Lab Results:   Additional Information SSN [id number] [place], [place]

## 2024-01-18 NOTE — Progress Notes (Signed)
 Physical Therapy Treatment Patient Details Name: Lisa Acosta MRN: [id number] DOB: 11-Jun-1951 Today's Date: 01/18/2024   History of Present Illness 73 y.o. female presents to [facility] hospital on 01/15/2024 for elective R TKA. PMH includes HTN, HLD, PAF, GAD, MDD, sarcoidosis.    PT Comments  Patient resting in bed and recently medicated. Pt reporting she was confused last night and did not feel her pain medication was timed properly. She states last night she forgot where she was and if it was day or nighttime. Re-oriented pt and she was able to look at window and identify it is daytime, stated she is at [facility], had a TKA, and that is is Friday. Session focused on ROM exercises with AAROM for Rt knee. Cues needed throughout for breathing reduce tension as pt holding breath and tensing entire body with exercises. Counting out loud improved pt's performance and reduced tension. EOS retrograde massage completed for pain and edema management. Discussed goal to mobilize EOB/OOB in afternoon visit. Continue to recommend inpatient follow up therapy, <3 hours/day. Will progress as able.    If plan is discharge home, recommend the following: A little help with walking and/or transfers;A little help with bathing/dressing/bathroom;Assistance with cooking/housework;Assist for transportation;Help with stairs or ramp for entrance   Can travel by private vehicle     No  Equipment Recommendations  None recommended by PT    Recommendations for Other Services       Precautions / Restrictions Precautions Precautions: Fall;Knee Precaution Booklet Issued: Yes (comment) Recall of Precautions/Restrictions: Intact Restrictions Weight Bearing Restrictions Per Provider Order: Yes RLE Weight Bearing Per Provider Order: Weight bearing as tolerated     Mobility  Bed Mobility               General bed mobility comments: limited by pain focused session on ROM    Transfers                         Ambulation/Gait                   Stairs             Wheelchair Mobility     Tilt Bed    Modified Rankin (Stroke Patients Only)       Balance Overall balance assessment: Needs assistance Sitting-balance support: No upper extremity supported, Feet supported Sitting balance-Leahy Scale: Good     Standing balance support: Bilateral upper extremity supported, Reliant on assistive device for balance Standing balance-Leahy Scale: Poor                              Communication Communication Communication: No apparent difficulties  Cognition Arousal: Alert Behavior During Therapy: Anxious, WFL for tasks assessed/performed   PT - Cognitive impairments: Awareness, Sequencing, Initiation, Problem solving, Safety/Judgement, Other                       PT - Cognition Comments: pt reports did know if it was night or day last night, waited 6 hours for pain medicinel doesn't like using purewick. RN reports pt has been calling her daughter confused. Following commands: Intact      Cueing Cueing Techniques: Verbal cues  Exercises Total Joint Exercises Ankle Circles/Pumps: AROM, Both, 20 reps, Supine Quad Sets: AAROM, Right, 10 reps, Supine (poor activation-towel under ankle) Short Arc Quad: AAROM, Right, 10 reps, Limitations, Supine Short Arc [employer]  Limitations: poor quad activation Heel Slides: AAROM, Right, 10 reps, Limitations, Supine Heel Slides Limitations: 2x10 Hip ABduction/ADduction: AAROM, Right, 10 reps, Supine Other Exercises Other Exercises: retrograde massafe for edema and pain control    General Comments        Pertinent Vitals/Pain Pain Assessment Pain Assessment: 0-10 Pain Score: 8  Pain Location: R knee Pain Descriptors / Indicators: Sore Pain Intervention(s): Limited activity within patient's tolerance, Monitored during session, Repositioned, Premedicated before session, Ice applied, Utilized relaxation techniques     Home Living                          Prior Function            PT Goals (current goals can now be found in the care plan section) Acute Rehab PT Goals Patient Stated Goal: to return to independence PT Goal Formulation: With patient Time For Goal Achievement: 01/19/24 Potential to Achieve Goals: Good Progress towards PT goals: Progressing toward goals    Frequency    7X/week      PT Plan      Co-evaluation              AM-PAC PT "6 Clicks" Mobility   Outcome Measure  Help needed turning from your back to your side while in a flat bed without using bedrails?: A Little Help needed moving from lying on your back to sitting on the side of a flat bed without using bedrails?: A Lot Help needed moving to and from a bed to a chair (including a wheelchair)?: Total Help needed standing up from a chair using your arms (e.g., wheelchair or bedside chair)?: Total Help needed to walk in hospital room?: Total Help needed climbing 3-5 steps with a railing? : Total 6 Click Score: 9    End of Session Equipment Utilized During Treatment: Gait belt Activity Tolerance: Patient tolerated treatment well Patient left: in bed;with call bell/phone within reach;with bed alarm set Nurse Communication: Mobility status PT Visit Diagnosis: Other abnormalities of gait and mobility (R26.89);Muscle weakness (generalized) (M62.81);Pain Pain - Right/Left: Right Pain - part of body: Knee     Time: 1005-1038 PT Time Calculation (min) (ACUTE ONLY): 33 min  Charges:    $Therapeutic Exercise: 8-22 mins $Massage: 8-22 mins PT General Charges $$ ACUTE PT VISIT: 1 Visit                     Tish Forge, DPT Acute Rehabilitation Services Office [phone]  01/18/24 11:34 AM

## 2024-01-18 NOTE — Progress Notes (Signed)
Incentive spirometry introduced with teach back 

## 2024-01-18 NOTE — Progress Notes (Signed)
 Patient ID: Lisa Acosta, female   DOB: 01-19-1951, 73 y.o.   MRN: [id number] The patient's vital signs are stable.  Based on her limited mobility and her slight confusion, she will need short-term skilled nursing placement following this hospitalization.  Her right operative knee is stable.  The transitional care team/case management has been consulted about skilled nursing placement.

## 2024-01-18 NOTE — TOC Initial Note (Signed)
 Transition of Care [facility]) - Initial/Assessment Note    Patient Details  Name: Lisa Acosta MRN: [id number] Date of Birth: 1950-12-23  Transition of Care [facility]) CM/SW Contact:    Katrinka Parr, LCSW Phone Number: 01/18/2024, 2:29 PM  Clinical Narrative:                   CSW called pt's daughter to discuss SNF recommendation. Daughter agreeable to SNF w/u. CSW explained referral process and medicare coverage. Daughter has preferences for [facility] or [facility]. Fl2 completed and bed requests sent in hub.   Expected Discharge Plan: Skilled Nursing Facility Barriers to Discharge: Continued Medical Work up, English as a second language teacher, SNF Pending bed offer   Patient Goals and CMS Choice Patient states their goals for this hospitalization and ongoing recovery are:: to go home CMS Medicare.gov Compare Post Acute Care list provided to:: Patient Choice offered to / list presented to : Patient      Expected Discharge Plan and Services                         DME Arranged: N/A         HH Arranged: PT, OT HH Agency: [facility]        Prior Living Arrangements/Services     Patient language and need for interpreter reviewed:: Yes        Need for Family Participation in Patient Care: Yes (Comment) Care giver support system in place?: Yes (comment)   Criminal Activity/Legal Involvement Pertinent to Current Situation/Hospitalization: No - Comment as needed  Activities of Daily Living   ADL Screening (condition at time of admission) Independently performs ADLs?: Yes (appropriate for developmental age) Is the patient deaf or have difficulty hearing?: Yes Does the patient have difficulty seeing, even when wearing glasses/contacts?: No Does the patient have difficulty concentrating, remembering, or making decisions?: No  Permission Sought/Granted                  Emotional Assessment       Orientation: : Oriented to Self Alcohol  /  Substance Use: Not Applicable Psych Involvement: No (comment)  Admission diagnosis:  Primary osteoarthritis of right knee [M17.11] Status post total right knee replacement [Z96.651] Patient Active Problem List   Diagnosis Date Noted   Status post total right knee replacement 01/15/2024   Unilateral primary osteoarthritis, right knee 10/15/2023   Sarcoidosis 03/08/2023   High risk medication use 03/08/2023   Nocturnal hypoxemia 03/08/2023   Muscle pain 01/09/2023   Generalized obesity 10/12/2022   Class 2 severe obesity with serious comorbidity and body mass index (BMI) of 35.0 to 35.9 in adult (HCC) 08/15/2022   Lung infiltrate    Persistent pneumonia    Hilar adenopathy    Vitamin D  insufficiency 01/10/2021   Cognitive complaints with normal neuropsychological exam 07/29/2020   Memory difficulty 08/18/2019   Right wrist pain 05/04/2019   Mild episode of recurrent major depressive disorder (HCC) 12/11/2018   Depression, major, single episode, mild (HCC) 10/22/2018   Gait abnormality 10/14/2018   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   Moderate episode of recurrent major depressive disorder (HCC) 09/26/2018   Generalized anxiety disorder 09/26/2018   Osteoporosis 01/25/2018   Fatty liver 08/22/2017   Gallbladder sludge 08/22/2017   Upper abdominal pain 07/19/2017   Gastroesophageal reflux disease with esophagitis 06/01/2017   Encounter for monitoring flecainide  therapy 04/12/2017   Osteoarthritis of hands, 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   Dyspnea 09/22/2016  OSA (obstructive sleep apnea) 08/31/2016   PAF (paroxysmal atrial fibrillation) (HCC) 03/16/2016   Hypothyroid 03/16/2016   Obesity (BMI 30-39.9) 03/16/2016   Skin lesion of left upper extremity 02/05/2016   Essential hypertension 11/10/2015   Hyperlipidemia 11/10/2015   Acute gastritis 06/05/2014   Allergic rhinitis 06/05/2014   Arthritis 06/05/2014   Diaphragmatic hernia 06/05/2014   Difficulty swallowing 06/05/2014    Diverticulosis of colon 06/05/2014   Hyperglycemia 06/05/2014   Impaired fasting glucose 06/05/2014   Insomnia 06/05/2014   Lower back pain 06/05/2014   Osteoarthritis of shoulder 06/05/2014   Pain in joint, pelvic region and thigh 06/05/2014   Pain of right thumb 06/05/2014   Esophageal reflux 03/18/2014   Esophageal ulcer 03/18/2014   Anxiety state 11/25/2013   PCP:  Kaylee Partridge, MD Pharmacy:   [id number] - [place] - [address] [place] [address] Phone: [phone] Fax: [phone]  [id number] - [place], [place] - [address] [place] [address] Phone: [phone] Fax: [phone]  [facility] [address] [place] - [address] [place] [address] Phone: [phone] Fax: [phone]     Social Drivers of Health (SDOH) Social History: SDOH Screenings   Food Insecurity: No Food Insecurity (01/15/2024)  Housing: Low Risk  (01/15/2024)  Transportation Needs: No Transportation Needs (01/15/2024)  Utilities: Not At Risk (01/15/2024)  Alcohol  Screen: Low Risk  (10/27/2021)  Depression (PHQ2-9): High Risk (05/09/2023)  Financial Resource Strain: Low Risk  (10/27/2021)  Physical Activity: Inactive (09/26/2018)  Social Connections: Socially Integrated (01/15/2024)  Stress: No Stress Concern Present (10/27/2021)  Tobacco Use: Medium Risk (01/15/2024)   SDOH Interventions:     Readmission Risk Interventions     No data to display

## 2024-01-18 NOTE — Progress Notes (Signed)
 Patient ID: Collie Lee Bernales, female   DOB: June 12, 1951, 73 y.o.   MRN: [id number] The patient is still struggling some with her mobility.  Her vital signs are stable and her right operative knee is stable.  I did change the dressing.  From my standpoint her confusion is much less today.  She follows commands appropriately and when her daughter was here earlier, she responds well to her daughter and to redirection.  She was able to tell me who brought her flowers in the room and how things have gone with her today.  An FL 2 has been signed and skilled nursing facility placement is pending.

## 2024-01-18 NOTE — Plan of Care (Signed)
  Problem: Education: Goal: Knowledge of General Education information will improve Description: Including pain rating scale, medication(s)/side effects and non-pharmacologic comfort measures Outcome: Progressing   Problem: Health Behavior/Discharge Planning: Goal: Ability to manage health-related needs will improve Outcome: Progressing   Problem: Clinical Measurements: Goal: Respiratory complications will improve Outcome: Progressing   Problem: Nutrition: Goal: Adequate nutrition will be maintained Outcome: Progressing   Problem: Coping: Goal: Level of anxiety will decrease Outcome: Progressing   Problem: Pain Managment: Goal: General experience of comfort will improve and/or be controlled Outcome: Progressing

## 2024-01-18 NOTE — Progress Notes (Signed)
 Physical Therapy Treatment Patient Details Name: Lisa Acosta MRN: [id number] DOB: [birth year] Today's Date: 01/18/2024   History of Present Illness 73 y.o. female presents to [facility] hospital on 01/15/2024 for elective R TKA. PMH includes HTN, HLD, PAF, GAD, MDD, sarcoidosis.    PT Comments  Patient resting in bed and agreeable to mobilize with therapy. Pt continues to require max assist for supine>sit EOB and able to maintain balance with CGA once feet on floor. Pt performed oral hygiene as c/o not being able to since admission. Session then focused on transfer training and time in Wb'ing. Patient required max assist with cues for UE use on RW and increased initiation on Lt LE to stand. 5 reps with final 2 achieving full upright posture. EOS pt returned to supine and repositioned for comfort. Will continue to progress as able. Patient will benefit from continued inpatient follow up therapy, <3 hours/day.   If plan is discharge home, recommend the following: A little help with walking and/or transfers;A little help with bathing/dressing/bathroom;Assistance with cooking/housework;Assist for transportation;Help with stairs or ramp for entrance   Can travel by private vehicle     No  Equipment Recommendations  None recommended by PT    Recommendations for Other Services       Precautions / Restrictions Precautions Precautions: Fall;Knee Precaution Booklet Issued: Yes (comment) Recall of Precautions/Restrictions: Intact Restrictions Weight Bearing Restrictions Per Provider Order: Yes RLE Weight Bearing Per Provider Order: Weight bearing as tolerated     Mobility  Bed Mobility Overal bed mobility: Needs Assistance Bed Mobility: Supine to Sit, Sit to Supine     Supine to sit: Max assist, HOB elevated, Used rails Sit to supine: Max assist, HOB elevated, Used rails   General bed mobility comments: cues for use of belt to assist Rt LE to EOB, pt requiring assist for bil LE's to EOB.  able to grip bed rail and initiate raising trunk with Max assist ultimately to complete rise. Max Assist EOS to return to supine.    Transfers Overall transfer level: Needs assistance Equipment used: Rolling walker (2 wheels) Transfers: Sit to/from Stand Sit to Stand: Max assist, From elevated surface           General transfer comment: Max Assist for partial and full stands from EOB. Pt required assist at hips to lift, manual assist for Rt knee extension and MAX cues for LT LE use and press up on bil UE with RW. 5x total.    Ambulation/Gait                   Stairs             Wheelchair Mobility     Tilt Bed    Modified Rankin (Stroke Patients Only)       Balance Overall balance assessment: Needs assistance Sitting-balance support: No upper extremity supported, Feet supported Sitting balance-Leahy Scale: Good     Standing balance support: Bilateral upper extremity supported, Reliant on assistive device for balance Standing balance-Leahy Scale: Poor                              Communication Communication Communication: No apparent difficulties  Cognition Arousal: Alert Behavior During Therapy: Anxious, WFL for tasks assessed/performed   PT - Cognitive impairments: Awareness, Sequencing, Initiation, Problem solving, Safety/Judgement, Other  PT - Cognition Comments: pt remains anxious and seems slightly unsure of her recovery. requires MAX encouragement. Pt aware of situation, time, and place. Following commands: Intact      Cueing Cueing Techniques: Verbal cues  Exercises Total Joint Exercises Ankle Circles/Pumps: AROM, Both, 20 reps, Supine Quad Sets: AAROM, Right, 10 reps, Supine (poor activation-towel under ankle) Short Arc Quad: AAROM, Right, 10 reps, Limitations, Supine Short Arc Quad Limitations: poor quad activation Heel Slides: AAROM, Right, 10 reps, Limitations, Supine Heel Slides  Limitations: 2x10 Hip ABduction/ADduction: AAROM, Right, 10 reps, Supine Other Exercises Other Exercises: encouraged ankle pumps and HEP participation    General Comments        Pertinent Vitals/Pain Pain Assessment Pain Assessment: 0-10 Pain Score: 8  Pain Location: R knee Pain Descriptors / Indicators: Sore Pain Intervention(s): Limited activity within patient's tolerance, Monitored during session, Premedicated before session, Repositioned    Home Living                          Prior Function            PT Goals (current goals can now be found in the care plan section) Acute Rehab PT Goals Patient Stated Goal: to return to independence PT Goal Formulation: With patient Time For Goal Achievement: 01/19/24 Potential to Achieve Goals: Good Progress towards PT goals: Progressing toward goals    Frequency    7X/week      PT Plan      Co-evaluation              AM-PAC PT "6 Clicks" Mobility   Outcome Measure  Help needed turning from your back to your side while in a flat bed without using bedrails?: A Little Help needed moving from lying on your back to sitting on the side of a flat bed without using bedrails?: A Lot Help needed moving to and from a bed to a chair (including a wheelchair)?: Total Help needed standing up from a chair using your arms (e.g., wheelchair or bedside chair)?: Total Help needed to walk in hospital room?: Total Help needed climbing 3-5 steps with a railing? : Total 6 Click Score: 9    End of Session Equipment Utilized During Treatment: Gait belt Activity Tolerance: Patient tolerated treatment well Patient left: in bed;with call bell/phone within reach;with bed alarm set Nurse Communication: Mobility status PT Visit Diagnosis: Other abnormalities of gait and mobility (R26.89);Muscle weakness (generalized) (M62.81);Pain Pain - Right/Left: Right Pain - part of body: Knee     Time: 2956-2130 PT Time Calculation  (min) (ACUTE ONLY): 35 min  Charges:    $Therapeutic Activity: 23-37 mins PT General Charges $$ ACUTE PT VISIT: 1 Visit                     Tish Forge, DPT Acute Rehabilitation Services Office [phone]  01/18/24 4:16 PM

## 2024-01-18 NOTE — Plan of Care (Signed)

## 2024-01-19 MED ORDER — POLYETHYLENE GLYCOL 3350 17 G PO PACK
17.0000 g | PACK | Freq: Every day | ORAL | Status: DC | PRN
Start: 2024-01-19 — End: 2024-01-21
  Administered 2024-01-19: 17 g via ORAL
  Filled 2024-01-19: qty 1

## 2024-01-19 MED ORDER — OXYCODONE HCL 5 MG PO TABS: 5.0000 mg | ORAL_TABLET | Freq: Four times a day (QID) | ORAL | 0 refills | Status: DC | PRN

## 2024-01-19 NOTE — Plan of Care (Signed)

## 2024-01-19 NOTE — Progress Notes (Addendum)
 Physical Therapy Treatment Patient Details Name: Lisa Acosta MRN: [id number] DOB: Sep 13, 1950 Today's Date: 01/19/2024   History of Present Illness 73 y.o. female presents to [facility] hospital on 01/15/2024 for elective R TKA. PMH includes HTN, HLD, PAF, GAD, MDD, sarcoidosis.    PT Comments  Pt up in chair on arrival and agreeable to session with slow progress towards acute goals as pt with increased pain, limiting standing and weight bearing tolerance, despite pre-medication prior to session. Pt requiring max A +2 to come to stand from chair to RW x2 trials with ma cues to extend LLE to achieve full upright sanding. Pt unable to progress stepping this session for step-pivot transfer, utilized stedy for dependent transfer chair>EOB. Pt requiring max A +2 to come to stand in stedy frame and mod +2 from stedy pads. Pt needing significantly increased time to follow simple single step commands as well as max multimodal cues and increased time for motor planing to perform simple LE exercises (ankle pumps, quad sets), also pt with noted difficulty motor planning and sequencing bringing cup with straw to mouth for drinking at end of session. RN updated and aware. Patient will benefit from continued inpatient follow up therapy, <3 hours/day, will continue to follow acutely.    If plan is discharge home, recommend the following: A little help with walking and/or transfers;A little help with bathing/dressing/bathroom;Assistance with cooking/housework;Assist for transportation;Help with stairs or ramp for entrance   Can travel by private vehicle     No  Equipment Recommendations  None recommended by PT    Recommendations for Other Services       Precautions / Restrictions Precautions Precautions: Fall;Knee Precaution Booklet Issued: Yes (comment) Recall of Precautions/Restrictions: Impaired Restrictions Weight Bearing Restrictions Per Provider Order: No RLE Weight Bearing Per Provider Order: Weight  bearing as tolerated     Mobility  Bed Mobility Overal bed mobility: Needs Assistance Bed Mobility: Sit to Supine       Sit to supine: Max assist, HOB elevated, Used rails   General bed mobility comments: max A to return BLEs to bed and reposition    Transfers Overall transfer level: Needs assistance Equipment used: Rolling walker (2 wheels), Ambulation equipment used Transfers: Sit to/from Stand Sit to Stand: Max assist, From elevated surface, +2 physical assistance, +2 safety/equipment           General transfer comment: max A +2 to come to stand to RW x2, unable to advance feet to step pivot. L knee flexed on all stands with pt unable to extend, dependently trasnfered chair>bed with stedy Transfer via Lift Equipment: Stedy  Ambulation/Gait               General Gait Details: unable this session   Stairs             Wheelchair Mobility     Tilt Bed    Modified Rankin (Stroke Patients Only)       Balance Overall balance assessment: Needs assistance Sitting-balance support: No upper extremity supported, Feet supported Sitting balance-Leahy Scale: Good     Standing balance support: Bilateral upper extremity supported, Reliant on assistive device for balance Standing balance-Leahy Scale: Poor Standing balance comment: Pt needs BUE support and therapist assist for standing.                            Communication Communication Communication: No apparent difficulties  Cognition Arousal: Alert Behavior During Therapy: [facility] for tasks  assessed/performed   PT - Cognitive impairments: Awareness, Sequencing, Initiation, Problem solving, Safety/Judgement, Other                       PT - Cognition Comments: requires MAX encouragement. Pt aware of situation, time (with cues), and place. increased time needed to follow commands Following commands: Intact      Cueing Cueing Techniques: Verbal cues, Tactile cues, Gestural cues   Exercises Total Joint Exercises Ankle Circles/Pumps: AROM, Both, Supine, 10 reps Quad Sets: AAROM, Right, 5 reps, Supine    General Comments General comments (skin integrity, edema, etc.): VSS on RA      Pertinent Vitals/Pain Pain Assessment Pain Assessment: Faces Faces Pain Scale: Hurts even more Pain Location: R knee Pain Descriptors / Indicators: Sore Pain Intervention(s): Monitored during session, Limited activity within patient's tolerance, Repositioned    Home Living Family/patient expects to be discharged to:: Private residence Living Arrangements: Spouse/significant other;Children;Parent Available Help at Discharge: Family;Available 24 hours/day (spouse is available 24/7 but cannot physically assist. Other family are available PRN) Type of Home: House Home Access: Level entry       Home Layout: One level (pt and spouse living in an in-law suite in the basement) Home Equipment: Rolling Walker (2 wheels);Rollator (4 wheels);Cane - single point;Shower seat      Prior Function            PT Goals (current goals can now be found in the care plan section) Acute Rehab PT Goals PT Goal Formulation: With patient Time For Goal Achievement: 01/19/24 Progress towards PT goals: Progressing toward goals    Frequency    7X/week      PT Plan      Co-evaluation              AM-PAC PT "6 Clicks" Mobility   Outcome Measure  Help needed turning from your back to your side while in a flat bed without using bedrails?: A Little Help needed moving from lying on your back to sitting on the side of a flat bed without using bedrails?: A Lot Help needed moving to and from a bed to a chair (including a wheelchair)?: Total Help needed standing up from a chair using your arms (e.g., wheelchair or bedside chair)?: Total Help needed to walk in hospital room?: Total Help needed climbing 3-5 steps with a railing? : Total 6 Click Score: 9    End of Session Equipment  Utilized During Treatment: Gait belt Activity Tolerance: Patient tolerated treatment well Patient left: in bed;with call bell/phone within reach;with bed alarm set Nurse Communication: Mobility status PT Visit Diagnosis: Other abnormalities of gait and mobility (R26.89);Muscle weakness (generalized) (M62.81);Pain Pain - Right/Left: Right Pain - part of body: Knee     Time: 1359-1425 PT Time Calculation (min) (ACUTE ONLY): 26 min  Charges:    $Therapeutic Activity: 23-37 mins PT General Charges $$ ACUTE PT VISIT: 1 Visit                     Lisa Acosta R. PTA Acute Rehabilitation Services Office: [phone]   Lisa Acosta 01/19/2024, 2:47 PM

## 2024-01-19 NOTE — Progress Notes (Addendum)
 Spoke to [place] as patient stated her last bowel movement was 01/11/24, per patient miralax works for her.  At 2057, per Dr. Duda may give Miralax PRN.

## 2024-01-19 NOTE — Evaluation (Signed)
 Occupational Therapy Evaluation Patient Details Name: Lisa Acosta MRN: [id number] DOB: [birth year] Today's Date: 01/19/2024   History of Present Illness   73 y.o. female presents to [facility] hospital on 01/15/2024 for elective R TKA. PMH includes HTN, HLD, PAF, GAD, MDD, sarcoidosis.     Clinical Impressions Pt currently at max +2 assist for simulated toilet transfers and toileting and LB selfcare.  Increased right knee pain with standing and inability to adequately support her weight on the RLE or demonstrate good step length.  Prior to admission she was modified independent living with her spouse.  Occasional use of cane or RW in the community.  Will benefit from acute care OT at this time in order to progress to a more independent level with selfcare tasks and transfers.  Recommend  continued inpatient follow up therapy, <3 hours/day post acute stay to progress to a level safe for home, manageable by her spouse, who cannot provide much physical assist.       If plan is discharge home, recommend the following:   A lot of help with walking and/or transfers;A lot of help with bathing/dressing/bathroom;Assist for transportation;Help with stairs or ramp for entrance     Functional Status Assessment   Patient has had a recent decline in their functional status and demonstrates the ability to make significant improvements in function in a reasonable and predictable amount of time.     Equipment Recommendations   Other (comment) (TBD next venue of care)      Precautions/Restrictions   Precautions Precautions: Fall;Knee Precaution Booklet Issued: Yes (comment) Recall of Precautions/Restrictions: Intact Restrictions Weight Bearing Restrictions Per Provider Order: No RLE Weight Bearing Per Provider Order: Weight bearing as tolerated     Mobility Bed Mobility Overal bed mobility: Needs Assistance Bed Mobility: Supine to Sit     Supine to sit: Max assist, HOB elevated, Used  rails     General bed mobility comments: Pt needed max assist for scooting hips out to the edge of bed and bringing the RLE over to the edge and then off.    Transfers Overall transfer level: Needs assistance Equipment used: Rolling walker (2 wheels) Transfers: Sit to/from Stand Sit to Stand: Max assist, From elevated surface, +2 physical assistance, +2 safety/equipment     Step pivot transfers: Max assist, +2 physical assistance, +2 safety/equipment, From elevated surface     General transfer comment: Small steps to the chair with use of the RW for support.  Decreased ability to support weight on the RLE with stepping and decreased ability to advance the RLE.      Balance Overall balance assessment: Needs assistance Sitting-balance support: No upper extremity supported, Feet supported Sitting balance-Leahy Scale: Good     Standing balance support: Bilateral upper extremity supported, Reliant on assistive device for balance Standing balance-Leahy Scale: Poor Standing balance comment: Pt needs BUE support and therapist assist for standing.                           ADL either performed or assessed with clinical judgement   ADL Overall ADL's : Needs assistance/impaired Eating/Feeding: Independent;Sitting   Grooming: Wash/dry hands;Wash/dry face;Set up;Sitting   Upper Body Bathing: Set up;Sitting   Lower Body Bathing: +2 for safety/equipment;+2 for physical assistance;Sit to/from stand;Maximal assistance   Upper Body Dressing : Set up;Sitting   Lower Body Dressing: +2 for physical assistance;Sit to/from stand;Maximal assistance   Toilet Transfer: Maximal assistance;+2 for physical assistance;+2 for safety/equipment;BSC/3in1;Rolling  walker (2 wheels)   Toileting- Clothing Manipulation and Hygiene: Maximal assistance;+2 for physical assistance;Sit to/from stand       Functional mobility during ADLs: Maximal assistance;+2 for physical assistance;+2 for  safety/equipment (step pivot) General ADL Comments: Pt currently with increased difficulty with sit to stand and bladder incontinence with standing.  Max +2 for standing and small steps around to the recliner.  Decreased ability to advance the RLE or support her weight for stepping with the left.  Pt unable to reach either LE for simulated dressing, will benefit from AE education.     Vision Baseline Vision/History: 0 No visual deficits Ability to See in Adequate Light: 0 Adequate Patient Visual Report: No change from baseline Vision Assessment?: No apparent visual deficits     Perception Perception: Not tested       Praxis Praxis: Not tested       Pertinent Vitals/Pain Pain Assessment Pain Assessment: 0-10 Pain Score: 5  Pain Location: R knee Pain Descriptors / Indicators: Sore Pain Intervention(s): Limited activity within patient's tolerance     Extremity/Trunk Assessment Upper Extremity Assessment Upper Extremity Assessment: Generalized weakness   Lower Extremity Assessment Lower Extremity Assessment: Defer to PT evaluation   Cervical / Trunk Assessment Cervical / Trunk Assessment: Normal   Communication Communication Communication: No apparent difficulties   Cognition Arousal: Alert Behavior During Therapy: WFL for tasks assessed/performed Cognition: No apparent impairments                               Following commands: Intact       Cueing  General Comments   Cueing Techniques: Verbal cues;Tactile cues;Gestural cues              Home Living Family/patient expects to be discharged to:: Private residence Living Arrangements: Spouse/significant other;Children;Parent Available Help at Discharge: Family;Available 24 hours/day (spouse is available 24/7 but cannot physically assist. Other family are available PRN) Type of Home: House Home Access: Level entry     Home Layout: One level (pt and spouse living in an in-law suite in the  basement)     Bathroom Shower/Tub: Walk-in Soil scientist Toilet: Handicapped height     Home Equipment: Agricultural consultant (2 wheels);Rollator (4 wheels);Cane - single point;Shower seat          Prior Functioning/Environment Prior Level of Function : Independent/Modified Independent;Driving             Mobility Comments: ambulatory with SPC in the home and a rollator in the community      OT Problem List: Decreased strength;Decreased knowledge of use of DME or AE;Decreased knowledge of precautions;Cardiopulmonary status limiting activity;Decreased activity tolerance;Impaired balance (sitting and/or standing);Pain   OT Treatment/Interventions: Self-care/ADL training;Patient/family education;Balance training;Therapeutic activities;DME and/or AE instruction      OT Goals(Current goals can be found in the care plan section)   Acute Rehab OT Goals Patient Stated Goal: Pt did not state but agreeable to therapy OT Goal Formulation: With patient Time For Goal Achievement: 02/02/24 Potential to Achieve Goals: Good   OT Frequency:  Min 1X/week       AM-PAC OT "6 Clicks" Daily Activity     Outcome Measure Help from another person eating meals?: None Help from another person taking care of personal grooming?: A Little Help from another person toileting, which includes using toliet, bedpan, or urinal?: Total Help from another person bathing (including washing, rinsing, drying)?: Total Help from another person  to put on and taking off regular upper body clothing?: A Little Help from another person to put on and taking off regular lower body clothing?: Total 6 Click Score: 13   End of Session Equipment Utilized During Treatment: Gait belt Nurse Communication: Mobility status;Need for lift equipment  Activity Tolerance: Patient limited by pain Patient left: in chair  OT Visit Diagnosis: Unsteadiness on feet (R26.81);Other abnormalities of gait and mobility (R26.89);Muscle  weakness (generalized) (M62.81);Pain Pain - Right/Left: Right Pain - part of body: Knee                Time: 1610-9604 OT Time Calculation (min): 39 min Charges:  OT General Charges $OT Visit: 1 Visit OT Evaluation $OT Eval Moderate Complexity: 1 Mod OT Treatments $Self Care/Home Management : 23-37 mins  Ardena Becker, OTR/L Acute Rehabilitation Services  Office [phone] 01/19/2024

## 2024-01-19 NOTE — Progress Notes (Signed)
 Patient ID: Lisa Acosta, female   DOB: 1951/01/23, 73 y.o.   MRN: [id number] The patient is awake and alert and following commands appropriately.  Her cognitive status seems to be improving as well.  Her vital signs are stable and her right operative knee is stable.  At this point we are awaiting disposition from the transitional care team in terms of short-term skilled nursing facility placement.

## 2024-01-20 NOTE — Plan of Care (Signed)
   Problem: Health Behavior/Discharge Planning: Goal: Ability to manage health-related needs will improve Outcome: Progressing

## 2024-01-20 NOTE — TOC Progression Note (Signed)
 Transition of Care [facility]) - Progression Note    Patient Details  Name: Lisa Acosta MRN: [id number] Date of Birth: [birth year]  Transition of Care [facility]) CM/SW Contact  Adline Hook, [place] Phone Number: 01/20/2024, 2:54 PM  Clinical Narrative:     CSW met with patient at bedside. CSW gave bed offers, patient states she will discuss with her daughter before making a decision.   Expected Discharge Plan: Skilled Nursing Facility Barriers to Discharge: Continued Medical Work up, English as a second language teacher, SNF Pending bed offer  Expected Discharge Plan and Services                         DME Arranged: N/A         HH Arranged: PT, OT HH Agency: [facility]         Social Determinants of Health (SDOH) Interventions SDOH Screenings   Food Insecurity: No Food Insecurity (01/15/2024)  Housing: Low Risk  (01/15/2024)  Transportation Needs: No Transportation Needs (01/15/2024)  Utilities: Not At Risk (01/15/2024)  Alcohol  Screen: Low Risk  (10/27/2021)  Depression (PHQ2-9): High Risk (05/09/2023)  Financial Resource Strain: Low Risk  (10/27/2021)  Physical Activity: Inactive (09/26/2018)  Social Connections: Socially Integrated (01/15/2024)  Stress: No Stress Concern Present (10/27/2021)  Tobacco Use: Medium Risk (01/15/2024)    Readmission Risk Interventions     No data to display

## 2024-01-20 NOTE — Progress Notes (Signed)
 Patient ID: Lisa Acosta, female   DOB: [birth year], 73 y.o.   MRN: [id number] Patient is comfortable this morning.  She is making slow progress with physical therapy and anticipate discharge to skilled nursing.

## 2024-01-20 NOTE — Plan of Care (Signed)
  Problem: Activity: Goal: Risk for activity intolerance will decrease Outcome: Not Progressing   Problem: Elimination: Goal: Will not experience complications related to bowel motility Outcome: Not Progressing   Problem: Pain Managment: Goal: General experience of comfort will improve and/or be controlled Outcome: Not Progressing   Problem: Safety: Goal: Ability to remain free from injury will improve Outcome: Not Progressing   Problem: Skin Integrity: Goal: Risk for impaired skin integrity will decrease Outcome: Not Progressing

## 2024-01-21 DIAGNOSIS — M130 Polyarthritis, unspecified: Secondary | ICD-10-CM | POA: Diagnosis not present

## 2024-01-21 DIAGNOSIS — F39 Unspecified mood [affective] disorder: Secondary | ICD-10-CM | POA: Diagnosis not present

## 2024-01-21 DIAGNOSIS — E039 Hypothyroidism, unspecified: Secondary | ICD-10-CM | POA: Diagnosis not present

## 2024-01-21 DIAGNOSIS — D860 Sarcoidosis of lung: Secondary | ICD-10-CM | POA: Diagnosis not present

## 2024-01-21 DIAGNOSIS — I480 Paroxysmal atrial fibrillation: Secondary | ICD-10-CM | POA: Diagnosis not present

## 2024-01-21 DIAGNOSIS — D84821 Immunodeficiency due to drugs: Secondary | ICD-10-CM | POA: Diagnosis not present

## 2024-01-21 DIAGNOSIS — K279 Peptic ulcer, site unspecified, unspecified as acute or chronic, without hemorrhage or perforation: Secondary | ICD-10-CM | POA: Diagnosis not present

## 2024-01-21 DIAGNOSIS — G4733 Obstructive sleep apnea (adult) (pediatric): Secondary | ICD-10-CM | POA: Diagnosis not present

## 2024-01-21 DIAGNOSIS — M810 Age-related osteoporosis without current pathological fracture: Secondary | ICD-10-CM | POA: Diagnosis not present

## 2024-01-21 DIAGNOSIS — R4189 Other symptoms and signs involving cognitive functions and awareness: Secondary | ICD-10-CM | POA: Diagnosis not present

## 2024-01-21 DIAGNOSIS — T50905D Adverse effect of unspecified drugs, medicaments and biological substances, subsequent encounter: Secondary | ICD-10-CM | POA: Diagnosis not present

## 2024-01-21 DIAGNOSIS — R296 Repeated falls: Secondary | ICD-10-CM | POA: Diagnosis not present

## 2024-01-21 LAB — URINALYSIS, ROUTINE W REFLEX MICROSCOPIC
Bilirubin Urine: NEGATIVE
Glucose, UA: NEGATIVE mg/dL
Hgb urine dipstick: NEGATIVE
Ketones, ur: NEGATIVE mg/dL
Leukocytes,Ua: NEGATIVE
Nitrite: NEGATIVE
Protein, ur: NEGATIVE mg/dL
Specific Gravity, Urine: 1.01 (ref 1.005–1.030)
pH: 7 (ref 5.0–8.0)

## 2024-01-21 MED ORDER — TRAMADOL HCL 50 MG PO TABS
50.0000 mg | ORAL_TABLET | Freq: Four times a day (QID) | ORAL | 0 refills | Status: DC | PRN
Start: 2024-01-21 — End: 2024-02-29

## 2024-01-21 MED ORDER — TRAMADOL HCL 50 MG PO TABS
50.0000 mg | ORAL_TABLET | Freq: Four times a day (QID) | ORAL | Status: DC | PRN
  Administered 2024-01-21: 50 mg via ORAL
  Administered 2024-01-21: 100 mg via ORAL
  Filled 2024-01-21: qty 1
  Filled 2024-01-21: qty 2

## 2024-01-21 NOTE — TOC Transition Note (Signed)
 Transition of Care [facility]) - Discharge Note   Patient Details  Name: Lisa Acosta MRN: [id number] Date of Birth: 07/08/1951  Transition of [facility]) CM/SW Contact:  Valery Gaucher, LCSW Phone Number: 01/21/2024, 2:03 PM   Clinical Narrative:     Patient will Discharge WU:[id number] Discharge Date:01/21/24 Family Notified: daughter Transport GN:[id number]  Per MD patient is ready for discharge. RN, patient, and facility notified of discharge. Discharge Summary sent to facility. RN given number for [phone]. Ambulance transport requested for patient.   Clinical Social Worker signing off.  Liddie Reel, MSW, LCSW Clinical Social Worker    Final next level of care: Skilled Nursing Facility Barriers to Discharge: Barriers Resolved   Patient Goals and CMS Choice Patient states their goals for this hospitalization and ongoing recovery are:: to go home CMS Medicare.gov Compare Post Acute Care list provided to:: Patient Choice offered to / list presented to : Patient      Discharge Placement              Patient chooses bed at: [facility] Patient to be transferred to facility by: [facility] Name of family member notified: daughter Patient and family notified of of transfer: 01/21/24  Discharge Plan and Services Additional resources added to the After Visit Summary for                  DME Arranged: N/A         HH Arranged: PT, OT HH Agency: [facility]        Social Drivers of Health (SDOH) Interventions SDOH Screenings   Food Insecurity: No Food Insecurity (01/15/2024)  Housing: Low Risk  (01/15/2024)  Transportation Needs: No Transportation Needs (01/15/2024)  Utilities: Not At Risk (01/15/2024)  Alcohol  Screen: Low Risk  (10/27/2021)  Depression (PHQ2-9): High Risk (05/09/2023)  Financial Resource Strain: Low Risk  (10/27/2021)  Physical Activity: Inactive (09/26/2018)  Social Connections: Socially Integrated (01/15/2024)   Stress: No Stress Concern Present (10/27/2021)  Tobacco Use: Medium Risk (01/15/2024)     Readmission Risk Interventions     No data to display

## 2024-01-21 NOTE — TOC Progression Note (Signed)
 Transition of Care [facility]) - Progression Note    Patient Details  Name: Lisa Acosta MRN: [id number] Date of Birth: July 13, 1951  Transition of Care [facility]) CM/SW Contact  Valery Gaucher, [place] Phone Number: 01/21/2024, 1:56 PM  Clinical Narrative:     Patient's daughter call back- she accepted bed with [facility]. SNF can admit today.  Liddie Reel, MSW, LCSW Clinical Social Worker    Expected Discharge Plan: Skilled Nursing Facility Barriers to Discharge: Continued Medical Work up, English as a second language teacher, SNF Pending bed offer  Expected Discharge Plan and Services         Expected Discharge Date: 01/21/24               DME Arranged: N/A         HH Arranged: PT, OT HH Agency: [facility]         Social Determinants of Health (SDOH) Interventions SDOH Screenings   Food Insecurity: No Food Insecurity (01/15/2024)  Housing: Low Risk  (01/15/2024)  Transportation Needs: No Transportation Needs (01/15/2024)  Utilities: Not At Risk (01/15/2024)  Alcohol  Screen: Low Risk  (10/27/2021)  Depression (PHQ2-9): High Risk (05/09/2023)  Financial Resource Strain: Low Risk  (10/27/2021)  Physical Activity: Inactive (09/26/2018)  Social Connections: Socially Integrated (01/15/2024)  Stress: No Stress Concern Present (10/27/2021)  Tobacco Use: Medium Risk (01/15/2024)    Readmission Risk Interventions     No data to display

## 2024-01-21 NOTE — Progress Notes (Signed)
 AVS, signed printed prescription, and social worker's paperwork placed in discharge packet. Report called and given to [facility] [place] [facility] LPN at [facility]. All questions answered to satisfaction. [facility] to transport pt with all belongings.

## 2024-01-21 NOTE — Discharge Summary (Signed)
 Patient ID: Lisa Acosta MRN: [id number] DOB/AGE: 1951-08-27 72 y.o.  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 Fib   Esophageal ulcer    Fatty liver    Medication Induced. 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 female who was admitted 01/15/2024 for operative treatment ofUnilateral primary osteoarthritis, right knee. Patient has severe unremitting pain that affects sleep, daily activities, and work/hobbies. After pre-op clearance the patient was taken to the operating room on 01/15/2024 and underwent  Procedure(s): ARTHROPLASTY, KNEE, TOTAL.    Patient was given perioperative antibiotics:  Anti-infectives (From admission, onward)    Start     Dose/Rate Route Frequency Ordered Stop   01/15/24 1800  ceFAZolin  (ANCEF ) IVPB 2g/100 mL premix        2 g 200 mL/hr over 30 Minutes Intravenous Every 6 hours 01/15/24 1551 01/16/24 0044   01/15/24 1000  ceFAZolin  (ANCEF ) IVPB 2g/100 mL  premix        2 g 200 mL/hr over 30 Minutes Intravenous On call to O.R. 01/15/24 0949 01/15/24 1253        Patient was given sequential compression devices, early ambulation, and chemoprophylaxis to prevent DVT.  Patient benefited maximally from hospital stay and there were no complications.    Recent vital signs: Patient Vitals for the past 24 hrs:  BP Temp Temp src Pulse Resp SpO2  01/21/24 0537 (!) 122/56 98.8 F (37.1 C) Oral (!) 56 17 98 %  01/20/24 2037 (!) 108/53 100 F (37.8 C) Oral 69 16 91 %     Recent laboratory studies: No results for input(s): "WBC", "HGB", "HCT", "PLT", "NA", "K", "CL", "CO2", "BUN", "CREATININE", "GLUCOSE", "INR", "CALCIUM " in the last 72 hours.  Invalid input(s): "PT", "2"   Discharge Medications:   Allergies as of 01/21/2024       Reactions   Dextran Anaphylaxis   Tree Extract    Penicillin G Rash   Penicillins Rash   Sulfa Antibiotics Rash, Other (See Comments)   Wound Dressing Adhesive Rash        Medication List     TAKE these medications    alendronate  70 MG tablet Commonly known as: FOSAMAX  Take 1 tablet (70 mg total) by mouth every 7 (seven) days. Take with a full glass of water on an empty stomach.   amLODipine  5 MG tablet Commonly known as:  NORVASC  Take 1 tablet (5 mg total) by mouth daily.   atenolol  50 MG tablet Commonly known as: TENORMIN  TAKE 1 TABLET BY MOUTH EVERY DAY   benzonatate  200 MG capsule Commonly known as: TESSALON  Take 1 capsule (200 mg total) by mouth 3 (three) times daily as needed for cough.   CALCIUM  600 + D PO Take 1 tablet by mouth in the morning and at bedtime.   cetirizine  10 MG tablet Commonly known as: ZYRTEC  Take 1 tablet (10 mg total) by mouth daily.   flecainide  100 MG tablet Commonly known as: TAMBOCOR  TAKE 1 TABLET BY MOUTH 2 TIMES A DAY   fluticasone  50 MCG/ACT nasal spray Commonly known as: FLONASE  Place 2 sprays into both nostrils 2 (two) times daily.   folic acid  1 MG  tablet Commonly known as: FOLVITE  Take 2 tablets (2 mg total) by mouth daily.   guaiFENesin  600 MG 12 hr tablet Commonly known as: Mucinex  Take 2 tablets (1,200 mg total) by mouth 2 (two) times daily. What changed: when to take this   levothyroxine  125 MCG tablet Commonly known as: SYNTHROID  Take 1 tablet (125 mcg total) by mouth daily before breakfast.   LORazepam  0.5 MG tablet Commonly known as: Ativan  Take 1-2 tablets (0.5-1 mg total) by mouth at bedtime. Use as needed for sleep   methotrexate  2.5 MG tablet Commonly known as: RHEUMATREX Take 8 tablets (20 mg total) by mouth once a week. Caution:Chemotherapy. Protect from light.   omeprazole  40 MG capsule Commonly known as: PRILOSEC Take 1 capsule (40 mg total) by mouth in the morning and at bedtime. What changed: when to take this   ondansetron  8 MG tablet Commonly known as: ZOFRAN  Take 1 tablet (8 mg total) by mouth every 8 (eight) hours as needed for nausea or vomiting.   PRESERVISION AREDS 2+MULTI VIT PO Take 1 capsule by mouth daily.   rosuvastatin  20 MG tablet Commonly known as: CRESTOR  Take 1 tablet (20 mg total) by mouth daily.   sertraline  50 MG tablet Commonly known as: ZOLOFT  Take 1 tablet (50 mg total) by mouth daily. What changed:  how much to take when to take this   Systane Balance 0.6 % Soln Generic drug: Propylene Glycol Place 1 drop into both eyes as needed (dry eyes).   traMADol  50 MG tablet Commonly known as: ULTRAM  Take 1-2 tablets (50-100 mg total) by mouth every 6 (six) hours as needed for moderate pain (pain score 4-6).   traZODone  50 MG tablet Commonly known as: DESYREL  TAKE 1.5 TABLETS BY MOUTH AT BEDTIME. What changed: See the new instructions.   Xarelto  20 MG Tabs tablet Generic drug: rivaroxaban  TAKE 1 TABLET EVERY DAY WITH SUPPER               Durable Medical Equipment  (From admission, onward)           Start     Ordered   01/15/24 1552  DME 3 n 1  Once         01/15/24 1551   01/15/24 1552  DME Walker rolling  Once       Question Answer Comment  Walker: With 5 Inch Wheels   Patient needs a walker to treat with the following condition Status post total right knee replacement      01/15/24 1551            Diagnostic Studies: DG Knee Right Port Result Date: 01/15/2024 CLINICAL DATA:  Status post total right knee arthroplasty  EXAM: PORTABLE RIGHT KNEE - 1-2 VIEW COMPARISON:  September 21, 2023 FINDINGS: Total right knee arthroplasty with near anatomic alignment. Air in the subcutaneous tissues and suprapatellar bursa. IMPRESSION: Total right knee arthroplasty with near anatomic alignment. Electronically Signed   By: Fredrich Jefferson M.D.   On: 01/15/2024 15:53    Disposition: Discharge disposition: 03-Skilled Nursing Facility          Follow-up Information     Arnie Lao, MD Follow up in 2 week(s).   Specialty: Orthopedic Surgery Contact information: [address] [place] [address] [phone]                  Signed: Arnie Lao 01/21/2024, 7:40 AM

## 2024-01-21 NOTE — Care Management Important Message (Signed)
 Important Message  Patient Details  Name: Lisa Acosta MRN: [id number] Date of Birth: 02/19/1951   Important Message Given:  Yes - Medicare IM     Felix Host 01/21/2024, 2:11 PM

## 2024-01-21 NOTE — TOC Progression Note (Signed)
 Transition of Care [facility]) - Progression Note    Patient Details  Name: Lisa Acosta MRN: [id number] Date of Birth: 07-09-1951  [facility]) CM/SW Contact  Valery Gaucher, [place] Phone Number: 01/21/2024, 11:31 AM  Clinical Narrative:     Per chart review , patient wanted to talk with her daughter before making SNF choice. Spoke with patient's daughter,Kristinia, she inquired about [facility] or [facility].  Sent message to [facility], waiting on response.  Liddie Reel, MSW, LCSW Clinical Social Worker    Expected Discharge Plan: Skilled Nursing Facility Barriers to Discharge: Continued Medical Work up, English as a second language teacher, SNF Pending bed offer  Expected Discharge Plan and Services         Expected Discharge Date: 01/21/24               DME Arranged: N/A         HH Arranged: PT, OT HH [facility]         Social Determinants of Health (SDOH) Interventions SDOH Screenings   Food Insecurity: No Food Insecurity (01/15/2024)  Housing: Low Risk  (01/15/2024)  Transportation Needs: No Transportation Needs (01/15/2024)  Utilities: Not At Risk (01/15/2024)  Alcohol  Screen: Low Risk  (10/27/2021)  Depression (PHQ2-9): High Risk (05/09/2023)  Financial Resource Strain: Low Risk  (10/27/2021)  Physical Activity: Inactive (09/26/2018)  Social Connections: Socially Integrated (01/15/2024)  Stress: No Stress Concern Present (10/27/2021)  Tobacco Use: Medium Risk (01/15/2024)    Readmission Risk Interventions     No data to display

## 2024-01-21 NOTE — Plan of Care (Signed)
  Problem: Activity: Goal: Risk for activity intolerance will decrease Outcome: Progressing   Problem: Pain Managment: Goal: General experience of comfort will improve and/or be controlled Outcome: Progressing   Problem: Safety: Goal: Ability to remain free from injury will improve Outcome: Progressing   Problem: Skin Integrity: Goal: Risk for impaired skin integrity will decrease Outcome: Progressing

## 2024-01-21 NOTE — Progress Notes (Signed)
 Physical Therapy Treatment Patient Details Name: Lisa Acosta MRN: [id number] DOB: 10-04-1950 Today's Date: 01/21/2024   History of Present Illness 73 y.o. female presents to [facility] hospital on 01/15/2024 for elective R TKA. PMH includes HTN, HLD, PAF, GAD, MDD, sarcoidosis.    PT Comments  Pt received in supine and agreeable to session with son-in-law present. Pt demonstrates RLE weakness and limited ability to complete supine exercises without assist. Pt able to demonstrate improved R knee flexion in sitting, however requires assist to complete LAQ. Pt able to tolerate increased gait distance to and from bathroom with up to minA +2 and assist for hygiene tasks. Pt able to improve standing to min A with cues for technique. Pt demonstrates very limited RLE WB tolerance and heavy BUE reliance throughout due to pain. Education provided on R knee extension in resting and completing the TKA HEP independently. Pt continues to benefit from PT services to progress toward functional mobility goals.    If plan is discharge home, recommend the following: A little help with walking and/or transfers;A little help with bathing/dressing/bathroom;Assistance with cooking/housework;Assist for transportation;Help with stairs or ramp for entrance   Can travel by private vehicle     No  Equipment Recommendations  None recommended by PT    Recommendations for Other Services       Precautions / Restrictions Precautions Precautions: Fall;Knee Restrictions Weight Bearing Restrictions Per Provider Order: No RLE Weight Bearing Per Provider Order: Weight bearing as tolerated     Mobility  Bed Mobility Overal bed mobility: Needs Assistance Bed Mobility: Sit to Supine     Supine to sit: Contact guard     General bed mobility comments: Cues for technique and  increased time    Transfers Overall transfer level: Needs assistance Equipment used: Rolling walker (2 wheels) Transfers: Sit to/from Stand Sit  to Stand: Min assist, Mod assist, +2 safety/equipment           General transfer comment: Mod A from EOB progressing to min A from low toilet and recliner with cues for hand placement. R foot placed slightly forward for decreased WB due to pain. Cues for upright posture    Ambulation/Gait Ambulation/Gait assistance: Min assist, +2 safety/equipment Gait Distance (Feet): 10 Feet (x2) Assistive device: Rolling walker (2 wheels) Gait Pattern/deviations: Step-to pattern, Decreased stance time - right, Decreased stride length, Decreased step length - left, Trunk flexed, Antalgic Gait velocity: reduced     General Gait Details: Pt able to ambulate to/from toilet demonstrating short step-to pattern due to limited RLE WB tolerance. Cues for R knee extension in stance and upright posture. Pt requires heavy BUE reliance for RLE offloading.      Balance Overall balance assessment: Needs assistance Sitting-balance support: No upper extremity supported, Feet supported Sitting balance-Leahy Scale: Good     Standing balance support: Bilateral upper extremity supported, Reliant on assistive device for balance Standing balance-Leahy Scale: Poor Standing balance comment: with RW support                            Communication Communication Communication: No apparent difficulties  Cognition Arousal: Alert Behavior During Therapy: WFL for tasks assessed/performed   PT - Cognitive impairments: Awareness, Sequencing, Initiation, Problem solving, Safety/Judgement                         Following commands: Intact      Cueing Cueing Techniques: Verbal cues, Tactile  cues, Gestural cues  Exercises Total Joint Exercises Quad Sets: Right, 5 reps, Supine, AROM Long Arc Quad: AAROM, Seated, Right, 5 reps Knee Flexion: AROM, Right, Seated, 5 reps Knee Flexion Limitations: with washcloth under foot General Exercises - Lower Extremity Hip Flexion/Marching: AROM, Seated, Right,  5 reps    General Comments        Pertinent Vitals/Pain Pain Assessment Pain Assessment: 0-10 Pain Score: 8  Pain Location: R knee Pain Descriptors / Indicators: Sore, Grimacing, Guarding Pain Intervention(s): Monitored during session, Repositioned, Limited activity within patient's tolerance     PT Goals (current goals can now be found in the care plan section) Acute Rehab PT Goals Patient Stated Goal: to return to independence PT Goal Formulation: With patient Time For Goal Achievement: 01/19/24 Progress towards PT goals: Progressing toward goals    Frequency    7X/week       AM-PAC PT "6 Clicks" Mobility   Outcome Measure  Help needed turning from your back to your side while in a flat bed without using bedrails?: A Little Help needed moving from lying on your back to sitting on the side of a flat bed without using bedrails?: A Little Help needed moving to and from a bed to a chair (including a wheelchair)?: A Lot Help needed standing up from a chair using your arms (e.g., wheelchair or bedside chair)?: A Lot Help needed to walk in hospital room?: A Lot Help needed climbing 3-5 steps with a railing? : Total 6 Click Score: 13    End of Session Equipment Utilized During Treatment: Gait belt Activity Tolerance: Patient tolerated treatment well;Patient limited by pain Patient left: with call bell/phone within reach;in chair;with family/visitor present Nurse Communication: Mobility status PT Visit Diagnosis: Other abnormalities of gait and mobility (R26.89);Muscle weakness (generalized) (M62.81);Pain     Time: 1025-1057 PT Time Calculation (min) (ACUTE ONLY): 32 min  Charges:    $Gait Training: 8-22 mins $Therapeutic Activity: 8-22 mins PT General Charges $$ ACUTE PT VISIT: 1 Visit                     Michaelle Adolphus, PTA Acute Rehabilitation Services Secure Chat Preferred  [phone]    Michaelle Adolphus 01/21/2024, 11:22 AM

## 2024-01-24 DIAGNOSIS — F411 Generalized anxiety disorder: Secondary | ICD-10-CM | POA: Diagnosis not present

## 2024-01-24 DIAGNOSIS — Z96659 Presence of unspecified artificial knee joint: Secondary | ICD-10-CM | POA: Diagnosis not present

## 2024-01-24 DIAGNOSIS — F332 Major depressive disorder, recurrent severe without psychotic features: Secondary | ICD-10-CM | POA: Diagnosis not present

## 2024-01-25 ENCOUNTER — Encounter: Payer: Self-pay | Admitting: Family Medicine

## 2024-01-28 ENCOUNTER — Encounter: Payer: Self-pay | Admitting: Orthopaedic Surgery

## 2024-01-28 ENCOUNTER — Ambulatory Visit: Admitting: Orthopaedic Surgery

## 2024-01-28 DIAGNOSIS — Z96651 Presence of right artificial knee joint: Secondary | ICD-10-CM

## 2024-01-28 NOTE — Progress Notes (Signed)
 The patient is here today for first postoperative visit status post a right total knee replacement.  She is staying at a skilled nursing facility as she recovers.  She is 73 years old.  She reports that she is making good progress with range of motion and strengthening.  On exam her extension is full with the right knee and she can flex to about 80 degrees.  Her calf is soft.  She is on chronic Xarelto .  Her incision looks good.  The staples were removed and Steri-Strips applied.  She will continue aggressive physical therapy and from an orthopedic standpoint, we will see her back in a month to see how her motion is coming along.

## 2024-02-06 DIAGNOSIS — I480 Paroxysmal atrial fibrillation: Secondary | ICD-10-CM | POA: Diagnosis not present

## 2024-02-06 DIAGNOSIS — Z79899 Other long term (current) drug therapy: Secondary | ICD-10-CM | POA: Diagnosis not present

## 2024-02-06 DIAGNOSIS — Z7901 Long term (current) use of anticoagulants: Secondary | ICD-10-CM | POA: Diagnosis not present

## 2024-02-06 DIAGNOSIS — Z471 Aftercare following joint replacement surgery: Secondary | ICD-10-CM | POA: Diagnosis not present

## 2024-02-06 DIAGNOSIS — E039 Hypothyroidism, unspecified: Secondary | ICD-10-CM | POA: Diagnosis not present

## 2024-02-06 DIAGNOSIS — F39 Unspecified mood [affective] disorder: Secondary | ICD-10-CM | POA: Diagnosis not present

## 2024-02-06 DIAGNOSIS — E785 Hyperlipidemia, unspecified: Secondary | ICD-10-CM | POA: Diagnosis not present

## 2024-02-06 DIAGNOSIS — Z96651 Presence of right artificial knee joint: Secondary | ICD-10-CM | POA: Diagnosis not present

## 2024-02-06 DIAGNOSIS — F411 Generalized anxiety disorder: Secondary | ICD-10-CM | POA: Diagnosis not present

## 2024-02-06 DIAGNOSIS — M810 Age-related osteoporosis without current pathological fracture: Secondary | ICD-10-CM | POA: Diagnosis not present

## 2024-02-06 DIAGNOSIS — T50905D Adverse effect of unspecified drugs, medicaments and biological substances, subsequent encounter: Secondary | ICD-10-CM | POA: Diagnosis not present

## 2024-02-06 DIAGNOSIS — M130 Polyarthritis, unspecified: Secondary | ICD-10-CM | POA: Diagnosis not present

## 2024-02-06 DIAGNOSIS — D860 Sarcoidosis of lung: Secondary | ICD-10-CM | POA: Diagnosis not present

## 2024-02-06 DIAGNOSIS — R296 Repeated falls: Secondary | ICD-10-CM | POA: Diagnosis not present

## 2024-02-06 DIAGNOSIS — K279 Peptic ulcer, site unspecified, unspecified as acute or chronic, without hemorrhage or perforation: Secondary | ICD-10-CM | POA: Diagnosis not present

## 2024-02-06 DIAGNOSIS — Z6833 Body mass index (BMI) 33.0-33.9, adult: Secondary | ICD-10-CM | POA: Diagnosis not present

## 2024-02-06 DIAGNOSIS — E669 Obesity, unspecified: Secondary | ICD-10-CM | POA: Diagnosis not present

## 2024-02-06 DIAGNOSIS — J309 Allergic rhinitis, unspecified: Secondary | ICD-10-CM | POA: Diagnosis not present

## 2024-02-06 DIAGNOSIS — I10 Essential (primary) hypertension: Secondary | ICD-10-CM | POA: Diagnosis not present

## 2024-02-06 DIAGNOSIS — Z604 Social exclusion and rejection: Secondary | ICD-10-CM | POA: Diagnosis not present

## 2024-02-06 DIAGNOSIS — D84821 Immunodeficiency due to drugs: Secondary | ICD-10-CM | POA: Diagnosis not present

## 2024-02-06 DIAGNOSIS — F332 Major depressive disorder, recurrent severe without psychotic features: Secondary | ICD-10-CM | POA: Diagnosis not present

## 2024-02-06 DIAGNOSIS — G4733 Obstructive sleep apnea (adult) (pediatric): Secondary | ICD-10-CM | POA: Diagnosis not present

## 2024-02-07 ENCOUNTER — Telehealth: Payer: Self-pay | Admitting: Orthopaedic Surgery

## 2024-02-07 NOTE — Telephone Encounter (Signed)
 Verbal order left on VM

## 2024-02-07 NOTE — Telephone Encounter (Signed)
 Polly Brink (PT) from [facility] called requesting verbals for 2wk1, 3wk 1, and 1wk 1. Polly Brink secure number is [phone].

## 2024-02-11 DIAGNOSIS — I480 Paroxysmal atrial fibrillation: Secondary | ICD-10-CM | POA: Diagnosis not present

## 2024-02-11 DIAGNOSIS — F332 Major depressive disorder, recurrent severe without psychotic features: Secondary | ICD-10-CM | POA: Diagnosis not present

## 2024-02-11 DIAGNOSIS — D860 Sarcoidosis of lung: Secondary | ICD-10-CM | POA: Diagnosis not present

## 2024-02-11 DIAGNOSIS — Z471 Aftercare following joint replacement surgery: Secondary | ICD-10-CM | POA: Diagnosis not present

## 2024-02-11 DIAGNOSIS — F39 Unspecified mood [affective] disorder: Secondary | ICD-10-CM | POA: Diagnosis not present

## 2024-02-11 DIAGNOSIS — Z96651 Presence of right artificial knee joint: Secondary | ICD-10-CM | POA: Diagnosis not present

## 2024-02-13 ENCOUNTER — Other Ambulatory Visit: Payer: Self-pay | Admitting: Family Medicine

## 2024-02-13 ENCOUNTER — Encounter (INDEPENDENT_AMBULATORY_CARE_PROVIDER_SITE_OTHER): Payer: Self-pay | Admitting: Family Medicine

## 2024-02-13 DIAGNOSIS — F332 Major depressive disorder, recurrent severe without psychotic features: Secondary | ICD-10-CM | POA: Diagnosis not present

## 2024-02-13 DIAGNOSIS — D860 Sarcoidosis of lung: Secondary | ICD-10-CM | POA: Diagnosis not present

## 2024-02-13 DIAGNOSIS — F320 Major depressive disorder, single episode, mild: Secondary | ICD-10-CM

## 2024-02-13 DIAGNOSIS — G4700 Insomnia, unspecified: Secondary | ICD-10-CM

## 2024-02-13 DIAGNOSIS — I480 Paroxysmal atrial fibrillation: Secondary | ICD-10-CM | POA: Diagnosis not present

## 2024-02-13 DIAGNOSIS — Z471 Aftercare following joint replacement surgery: Secondary | ICD-10-CM | POA: Diagnosis not present

## 2024-02-13 DIAGNOSIS — Z96651 Presence of right artificial knee joint: Secondary | ICD-10-CM | POA: Diagnosis not present

## 2024-02-13 DIAGNOSIS — F39 Unspecified mood [affective] disorder: Secondary | ICD-10-CM | POA: Diagnosis not present

## 2024-02-13 MED ORDER — TRAZODONE HCL 50 MG PO TABS: ORAL_TABLET | ORAL | 0 refills | Status: DC

## 2024-02-13 MED ORDER — SERTRALINE HCL 100 MG PO TABS: 100.0000 mg | ORAL_TABLET | Freq: Every day | ORAL | 3 refills | Status: AC

## 2024-02-13 NOTE — Telephone Encounter (Signed)
 Please see the MyChart message reply(ies) for my assessment and plan.  The patient gave consent for this Medical Advice Message and is aware that it may result in a bill to their insurance company as well as the possibility that this may result in a co-payment or deductible. They are an established patient, but are not seeking medical advice exclusively about a problem treated during an in person or video visit in the last 7 days. I did not recommend an in person or video visit within 7 days of my reply.  I spent a total of 10 minutes cumulative time within 7 days through MyChart messaging Abbe Amsterdam, MD

## 2024-02-15 DIAGNOSIS — Z471 Aftercare following joint replacement surgery: Secondary | ICD-10-CM | POA: Diagnosis not present

## 2024-02-15 DIAGNOSIS — Z96651 Presence of right artificial knee joint: Secondary | ICD-10-CM | POA: Diagnosis not present

## 2024-02-15 DIAGNOSIS — F332 Major depressive disorder, recurrent severe without psychotic features: Secondary | ICD-10-CM | POA: Diagnosis not present

## 2024-02-15 DIAGNOSIS — F39 Unspecified mood [affective] disorder: Secondary | ICD-10-CM | POA: Diagnosis not present

## 2024-02-15 DIAGNOSIS — D860 Sarcoidosis of lung: Secondary | ICD-10-CM | POA: Diagnosis not present

## 2024-02-15 DIAGNOSIS — I480 Paroxysmal atrial fibrillation: Secondary | ICD-10-CM | POA: Diagnosis not present

## 2024-02-18 DIAGNOSIS — Z471 Aftercare following joint replacement surgery: Secondary | ICD-10-CM | POA: Diagnosis not present

## 2024-02-18 DIAGNOSIS — Z96651 Presence of right artificial knee joint: Secondary | ICD-10-CM | POA: Diagnosis not present

## 2024-02-18 DIAGNOSIS — D860 Sarcoidosis of lung: Secondary | ICD-10-CM | POA: Diagnosis not present

## 2024-02-18 DIAGNOSIS — I480 Paroxysmal atrial fibrillation: Secondary | ICD-10-CM | POA: Diagnosis not present

## 2024-02-18 DIAGNOSIS — F39 Unspecified mood [affective] disorder: Secondary | ICD-10-CM | POA: Diagnosis not present

## 2024-02-18 DIAGNOSIS — F332 Major depressive disorder, recurrent severe without psychotic features: Secondary | ICD-10-CM | POA: Diagnosis not present

## 2024-02-19 ENCOUNTER — Telehealth: Payer: Self-pay | Admitting: Orthopaedic Surgery

## 2024-02-19 DIAGNOSIS — Z96651 Presence of right artificial knee joint: Secondary | ICD-10-CM

## 2024-02-19 NOTE — Telephone Encounter (Signed)
 Patient called and her right knee is still swollen and also she was told that she had to home PT for two weeks and then outpatient PT. She is done with home therapy and needs a referral for the outpatient PT. CB#[phone]

## 2024-02-19 NOTE — Addendum Note (Signed)
 Addended by: Catherene Close on: 02/19/2024 03:52 PM   Modules accepted: Orders

## 2024-02-19 NOTE — Telephone Encounter (Signed)
 I called and told pt that swelling is normal and to ice and elevate. I ordered pt for [facility] per pt request. Her PT ends on Friday. Can we get the referral over to them asap please

## 2024-02-19 NOTE — Addendum Note (Signed)
 Addended by: Catherene Close on: 02/19/2024 03:54 PM   Modules accepted: Orders

## 2024-02-20 DIAGNOSIS — I480 Paroxysmal atrial fibrillation: Secondary | ICD-10-CM | POA: Diagnosis not present

## 2024-02-20 DIAGNOSIS — F39 Unspecified mood [affective] disorder: Secondary | ICD-10-CM | POA: Diagnosis not present

## 2024-02-20 DIAGNOSIS — Z96651 Presence of right artificial knee joint: Secondary | ICD-10-CM | POA: Diagnosis not present

## 2024-02-20 DIAGNOSIS — F332 Major depressive disorder, recurrent severe without psychotic features: Secondary | ICD-10-CM | POA: Diagnosis not present

## 2024-02-20 DIAGNOSIS — D860 Sarcoidosis of lung: Secondary | ICD-10-CM | POA: Diagnosis not present

## 2024-02-20 DIAGNOSIS — Z471 Aftercare following joint replacement surgery: Secondary | ICD-10-CM | POA: Diagnosis not present

## 2024-02-21 NOTE — Telephone Encounter (Signed)
 Referral faxed to [phone] p [phone]

## 2024-02-22 ENCOUNTER — Telehealth: Payer: Self-pay | Admitting: Orthopaedic Surgery

## 2024-02-22 ENCOUNTER — Other Ambulatory Visit: Payer: Self-pay | Admitting: Orthopaedic Surgery

## 2024-02-22 DIAGNOSIS — Z96651 Presence of right artificial knee joint: Secondary | ICD-10-CM | POA: Diagnosis not present

## 2024-02-22 DIAGNOSIS — Z471 Aftercare following joint replacement surgery: Secondary | ICD-10-CM | POA: Diagnosis not present

## 2024-02-22 DIAGNOSIS — I480 Paroxysmal atrial fibrillation: Secondary | ICD-10-CM | POA: Diagnosis not present

## 2024-02-22 DIAGNOSIS — F39 Unspecified mood [affective] disorder: Secondary | ICD-10-CM | POA: Diagnosis not present

## 2024-02-22 DIAGNOSIS — F332 Major depressive disorder, recurrent severe without psychotic features: Secondary | ICD-10-CM | POA: Diagnosis not present

## 2024-02-22 DIAGNOSIS — D860 Sarcoidosis of lung: Secondary | ICD-10-CM | POA: Diagnosis not present

## 2024-02-22 MED ORDER — GABAPENTIN 300 MG PO CAPS: 300.0000 mg | ORAL_CAPSULE | Freq: Every day | ORAL | 0 refills | Status: DC

## 2024-02-22 MED ORDER — TIZANIDINE HCL 2 MG PO TABS: 2.0000 mg | ORAL_TABLET | Freq: Three times a day (TID) | ORAL | 1 refills | Status: DC | PRN

## 2024-02-22 NOTE — Telephone Encounter (Signed)
 I spoke with patient. She is status post right TKA on 01/15/2024.  She states that she has been doing so well, had been walking without assistive devices, and only taking tylenol . Today for some reason, she is having pain that runs from her pelvis, around to right buttocks, and down right thigh.  She denies injury and states PT told her to take it easy and only do certain exercises.  This pain is so strong, she can hardly walk with the assistance of a walker.  Dhe has taken a hydrocodone  with not a lot of relief (this was from a previous rx a year ago).  She denies back pain, and notices pain is worse when she raises her leg to walk or as if she was marching.  She also wanted to let you know that she has had a temp of 99.7-100 degrees since surgery, but did not feel that was important.    She uses [facility] on [address].  She would like to know if there is something else that you advise or if there is medication that you would suggest as the pain is severe.   She has regularly scheduled appointment with you next Wednesday.  CB  [phone]

## 2024-02-22 NOTE — Telephone Encounter (Signed)
 I called patient and advised.

## 2024-02-22 NOTE — Telephone Encounter (Signed)
 Patient called and wants you to give her a call. She said she is hurting so bad that she can't walk with the walker. CB#[phone]

## 2024-02-22 NOTE — Telephone Encounter (Signed)
 Pt needs a refill of muscle relaxer's.  Pt hip pain is keeping her from performing well in PT for her knee.

## 2024-02-25 DIAGNOSIS — F39 Unspecified mood [affective] disorder: Secondary | ICD-10-CM | POA: Diagnosis not present

## 2024-02-25 DIAGNOSIS — F332 Major depressive disorder, recurrent severe without psychotic features: Secondary | ICD-10-CM | POA: Diagnosis not present

## 2024-02-25 DIAGNOSIS — D860 Sarcoidosis of lung: Secondary | ICD-10-CM | POA: Diagnosis not present

## 2024-02-25 DIAGNOSIS — I480 Paroxysmal atrial fibrillation: Secondary | ICD-10-CM | POA: Diagnosis not present

## 2024-02-25 DIAGNOSIS — Z96651 Presence of right artificial knee joint: Secondary | ICD-10-CM | POA: Diagnosis not present

## 2024-02-25 DIAGNOSIS — Z471 Aftercare following joint replacement surgery: Secondary | ICD-10-CM | POA: Diagnosis not present

## 2024-02-27 ENCOUNTER — Ambulatory Visit (INDEPENDENT_AMBULATORY_CARE_PROVIDER_SITE_OTHER): Admitting: Orthopaedic Surgery

## 2024-02-27 ENCOUNTER — Encounter: Payer: Self-pay | Admitting: Orthopaedic Surgery

## 2024-02-27 DIAGNOSIS — Z96651 Presence of right artificial knee joint: Secondary | ICD-10-CM

## 2024-02-27 MED ORDER — METHOCARBAMOL 500 MG PO TABS
500.0000 mg | ORAL_TABLET | Freq: Four times a day (QID) | ORAL | 1 refills | Status: AC | PRN
Start: 2024-02-27 — End: ?

## 2024-02-27 NOTE — Progress Notes (Signed)
 The patient comes in today at 6 weeks status post a right total knee arthroplasty.  She is transitioning to outpatient physical therapy next week.  She is ambulating with a cane and seems to be doing well.  She says that she would rather try different muscle relaxant and then tizanidine.  That is caused her to have too many other lightheaded type of issues.  I think Robaxin  will be more appropriate.  She has excellent questions today that we were able to answer.  On exam her knee extension was full and the flexion was just past 90 degrees.  It looks like she is making great progress from what we have seen her before.  She is on chronic blood thinning medications that she cannot take anti-inflammatories.  She will transition outpatient with her therapy and we will send in Robaxin  for her.  She can drive in the near future once she feels comfortable between the gas and the brake on the right knee and I would like her to at least have an outpatient physical therapy session or 2 before she drives.  Will see her back in a month to see how she is doing overall.  No x-rays are needed at that visit.

## 2024-02-29 ENCOUNTER — Ambulatory Visit (INDEPENDENT_AMBULATORY_CARE_PROVIDER_SITE_OTHER): Admitting: Family

## 2024-02-29 VITALS — BP 122/68 | HR 56 | Ht 61.0 in | Wt 175.4 lb

## 2024-02-29 DIAGNOSIS — L304 Erythema intertrigo: Secondary | ICD-10-CM | POA: Diagnosis not present

## 2024-02-29 MED ORDER — FLUCONAZOLE 100 MG PO TABS: 100.0000 mg | ORAL_TABLET | Freq: Every day | ORAL | 0 refills | Status: DC

## 2024-02-29 MED ORDER — NYSTATIN-TRIAMCINOLONE 100000-0.1 UNIT/GM-% EX CREA: 1.0000 | TOPICAL_CREAM | Freq: Two times a day (BID) | CUTANEOUS | 0 refills | Status: AC

## 2024-02-29 NOTE — Progress Notes (Signed)
 Lisa Acosta is a 73 y.o. female with the following history as recorded in EpicCare:  Patient Active Problem List   Diagnosis Date Noted   Status post total right knee replacement 01/15/2024   Unilateral primary osteoarthritis, right knee 10/15/2023   Sarcoidosis 03/08/2023   High risk medication use 03/08/2023   Nocturnal hypoxemia 03/08/2023   Muscle pain 01/09/2023   Generalized obesity 10/12/2022   Class 2 severe obesity with serious comorbidity and body mass index (BMI) of 35.0 to 35.9 in adult [facility]) 08/15/2022   Lung infiltrate    Persistent pneumonia    Hilar adenopathy    Vitamin D  insufficiency 01/10/2021   Cognitive complaints with normal neuropsychological exam 07/29/2020   Memory difficulty 08/18/2019   Right wrist pain 05/04/2019   Mild episode of recurrent major depressive disorder (HCC) 12/11/2018   Depression, major, single episode, mild (HCC) 10/22/2018   Gait abnormality 10/14/2018   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   Moderate episode of recurrent major depressive disorder (HCC) 09/26/2018   Generalized anxiety disorder 09/26/2018   Osteoporosis 01/25/2018   Fatty liver 08/22/2017   Gallbladder sludge 08/22/2017   Upper abdominal pain 07/19/2017   Gastroesophageal reflux disease with esophagitis 06/01/2017   Encounter for monitoring flecainide  therapy 04/12/2017   Osteoarthritis of hands, 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   Dyspnea 09/22/2016   OSA (obstructive sleep apnea) 08/31/2016   PAF (paroxysmal atrial fibrillation) (HCC) 03/16/2016   Hypothyroid 03/16/2016   Obesity (BMI 30-39.9) 03/16/2016   Skin lesion of left upper extremity 02/05/2016   Essential hypertension 11/10/2015   Hyperlipidemia 11/10/2015   Acute gastritis 06/05/2014   Allergic rhinitis 06/05/2014   Arthritis 06/05/2014   Diaphragmatic hernia 06/05/2014   Difficulty swallowing 06/05/2014   Diverticulosis of colon 06/05/2014   Hyperglycemia 06/05/2014   Impaired fasting glucose  06/05/2014   Insomnia 06/05/2014   Lower back pain 06/05/2014   Osteoarthritis of shoulder 06/05/2014   Pain in joint, pelvic region and thigh 06/05/2014   Pain of right thumb 06/05/2014   Esophageal reflux 03/18/2014   Esophageal ulcer 03/18/2014   Anxiety state 11/25/2013    Current Outpatient Medications  Medication Sig Dispense Refill   alendronate  (FOSAMAX ) 70 MG tablet Take 1 tablet (70 mg total) by mouth every 7 (seven) days. Take with a full glass of water on an empty stomach. 12 tablet 3   amLODipine  (NORVASC ) 5 MG tablet Take 1 tablet (5 mg total) by mouth daily. 90 tablet 1   atenolol  (TENORMIN ) 50 MG tablet TAKE 1 TABLET BY MOUTH EVERY DAY 90 tablet 1   benzonatate  (TESSALON ) 200 MG capsule Take 1 capsule (200 mg total) by mouth 3 (three) times daily as needed for cough. 60 capsule 2   Calcium  Carb-Cholecalciferol (CALCIUM  600 + D PO) Take 1 tablet by mouth in the morning and at bedtime.     cetirizine  (ZYRTEC ) 10 MG tablet Take 1 tablet (10 mg total) by mouth daily. 30 tablet 11   flecainide  (TAMBOCOR ) 100 MG tablet TAKE 1 TABLET BY MOUTH 2 TIMES A DAY 180 tablet 0   fluconazole (DIFLUCAN) 100 MG tablet Take 1 tablet (100 mg total) by mouth daily. 7 tablet 0   fluticasone  (FLONASE ) 50 MCG/ACT nasal spray Place 2 sprays into both nostrils 2 (two) times daily. 16 g 6   folic acid  (FOLVITE ) 1 MG tablet Take 2 tablets (2 mg total) by mouth daily. 180 tablet 3   gabapentin (NEURONTIN) 300 MG capsule Take 1 capsule (300 mg total) by mouth  at bedtime. 30 capsule 0   guaiFENesin  (MUCINEX ) 600 MG 12 hr tablet Take 2 tablets (1,200 mg total) by mouth 2 (two) times daily. (Patient taking differently: Take 1,200 mg by mouth daily.) 30 tablet 0   levothyroxine  (SYNTHROID ) 125 MCG tablet Take 1 tablet (125 mcg total) by mouth daily before breakfast. 90 tablet 1   methocarbamol  (ROBAXIN ) 500 MG tablet Take 1 tablet (500 mg total) by mouth every 6 (six) hours as needed. 40 tablet 1    methotrexate  (RHEUMATREX) 2.5 MG tablet Take 8 tablets (20 mg total) by mouth once a week. Caution:Chemotherapy. Protect from light. 96 tablet 2   Multiple Vitamins-Minerals (PRESERVISION AREDS 2+MULTI VIT PO) Take 1 capsule by mouth daily.     nystatin -triamcinolone  (MYCOLOG II) cream Apply 1 Application topically 2 (two) times daily. 30 g 0   omeprazole  (PRILOSEC) 40 MG capsule Take 1 capsule (40 mg total) by mouth in the morning and at bedtime. (Patient taking differently: Take 40 mg by mouth daily.) 180 capsule 1   ondansetron  (ZOFRAN ) 8 MG tablet Take 1 tablet (8 mg total) by mouth every 8 (eight) hours as needed for nausea or vomiting. 30 tablet 2   Propylene Glycol (SYSTANE BALANCE) 0.6 % SOLN Place 1 drop into both eyes as needed (dry eyes).     rivaroxaban  (XARELTO ) 20 MG TABS tablet TAKE 1 TABLET EVERY DAY WITH SUPPER 30 tablet 11   rosuvastatin  (CRESTOR ) 20 MG tablet Take 1 tablet (20 mg total) by mouth daily. 90 tablet 1   sertraline  (ZOLOFT ) 100 MG tablet Take 1 tablet (100 mg total) by mouth daily. 90 tablet 3   traZODone  (DESYREL ) 50 MG tablet Take 25 mg in the afternoon as needed for panic.  Take 50 mg at bedtime as needed for insomnia 135 tablet 0   LORazepam  (ATIVAN ) 0.5 MG tablet Take 1-2 tablets (0.5-1 mg total) by mouth at bedtime. Use as needed for sleep (Patient not taking: Reported on 01/02/2024) 60 tablet 0   traMADol  (ULTRAM ) 50 MG tablet Take 1-2 tablets (50-100 mg total) by mouth every 6 (six) hours as needed for moderate pain (pain score 4-6). (Patient not taking: Reported on 02/29/2024) 30 tablet 0   No current facility-administered medications for this visit.    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 Fib   Esophageal ulcer    Fatty liver    Medication Induced. 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   Pre-diabetes    Sarcoidosis     Past Surgical History:  Procedure Laterality Date   BREAST BIOPSY Left    needle core biopsy, benign   BRONCHIAL BIOPSY  06/15/2022   Procedure: BRONCHIAL  BIOPSIES;  Surgeon: Aleck Hurdle, MD;  Location[facility] ENDOSCOPY;  Service: Pulmonary;;   BRONCHIAL BIOPSY  06/29/2022   Procedure: BRONCHIAL BIOPSIES;  Surgeon: Prudy Brownie, DO;  Location: [facility] ENDOSCOPY;  Service: Pulmonary;;   BRONCHIAL NEEDLE ASPIRATION BIOPSY  06/15/2022   Procedure: BRONCHIAL NEEDLE ASPIRATION BIOPSIES;  Surgeon: Aleck Hurdle, MD;  Location: [facility] ENDOSCOPY;  Service: Pulmonary;;   BRONCHIAL NEEDLE ASPIRATION BIOPSY  06/29/2022   Procedure: BRONCHIAL NEEDLE ASPIRATION BIOPSIES;  Surgeon: Prudy Brownie, DO;  Location: [facility] ENDOSCOPY;  Service: Pulmonary;;   BRONCHIAL WASHINGS  06/15/2022   Procedure: BRONCHIAL WASHINGS;  Surgeon: Aleck Hurdle, MD;  Location: [facility] ENDOSCOPY;  Service: Pulmonary;;   CATARACT EXTRACTION W/ INTRAOCULAR LENS IMPLANT     COLONOSCOPY     DILATION AND CURETTAGE OF UTERUS     Miscarriage   HAND SURGERY Left    after a fall   KNEE SURGERY Right    x2   LASIK     ROTATOR CUFF REPAIR Right    x2   TOTAL ABDOMINAL HYSTERECTOMY     TOTAL KNEE ARTHROPLASTY Right 01/15/2024   Procedure: ARTHROPLASTY, KNEE, TOTAL;  Surgeon: Arnie Lao, MD;  Location: [facility] OR;  Service: Orthopedics;  Laterality: Right;    VIDEO BRONCHOSCOPY N/A 06/15/2022   Procedure: VIDEO BRONCHOSCOPY WITH FLUORO;  Surgeon: Aleck Hurdle, MD;  Location: [facility] ENDOSCOPY;  Service: Pulmonary;  Laterality: N/A;   VIDEO BRONCHOSCOPY WITH ENDOBRONCHIAL ULTRASOUND  06/15/2022   Procedure: VIDEO BRONCHOSCOPY WITH ENDOBRONCHIAL ULTRASOUND;  Surgeon: Aleck Hurdle, MD;  Location: [facility] ENDOSCOPY;  Service: Pulmonary;;   VIDEO BRONCHOSCOPY WITH ENDOBRONCHIAL ULTRASOUND N/A 06/29/2022   Procedure: VIDEO BRONCHOSCOPY WITH ENDOBRONCHIAL ULTRASOUND;  Surgeon: Prudy Brownie, DO;  Location: [facility] ENDOSCOPY;  Service: Pulmonary;  Laterality: N/A;    Family History  Problem Relation Age of Onset   Atrial fibrillation Mother    Congestive Heart Failure Mother    Depression Mother    Heart disease Mother    Stroke Mother    Thyroid  disease Mother    Anxiety disorder Mother    Obesity Mother    Heart disease Father    Alcohol  abuse Father    High blood pressure Father    High Cholesterol Father    Alcoholism Father    Depression Sister    Atrial fibrillation Sister    Heart attack Sister    Heart disease Brother    Heart disease Brother    Depression Daughter     Social History   Tobacco Use   Smoking status: Never    Passive exposure: Current (doesn't smoke in house now or past 56yrs)   Smokeless tobacco: Never  Substance Use Topics   Alcohol  use: Yes    Comment: rare (1-2x per year)    Subjective:   Concerned about rash in right groin area; +itching; has been recovering from knee replacement surgery and admits not as mobile recently as normal; limited relief with OTC medications;   Objective:  Vitals:   02/29/24 1124  BP: 122/68  Pulse: (!) 56  SpO2: 96%  Weight: 175 lb 6.4 oz (79.6 kg)  Height: 5' 1 (1.549 m)    General: Well developed, well nourished, in no acute distress  Skin : Warm and dry. Erythematous rash noted in right groin region Head: Normocephalic and atraumatic  Eyes: Sclera and conjunctiva clear;  pupils round and reactive to light; extraocular movements intact  Ears: External normal; canals clear; tympanic membranes  normal  Oropharynx: Pink, supple. No suspicious lesions  Neck: Supple without thyromegaly, adenopathy  Lungs: Respirations unlabored;  Neurologic: Alert and oriented; speech intact; face symmetrical; uses rolling walker  Assessment:  1. Intertrigo     Plan:  Rx for Diflucan and Mycolog II; encouraged to keep skin dry; do not feel antibiotic needed at this time but please call back if symptoms not improved by early next week;   No follow-ups on file.  No orders of the defined types were placed in this encounter.   Requested Prescriptions   Signed Prescriptions Disp Refills   fluconazole (DIFLUCAN) 100 MG tablet 7 tablet 0    Sig: Take 1 tablet (100 mg total) by mouth daily.   nystatin -triamcinolone  (MYCOLOG II) cream 30 g 0    Sig: Apply 1 Application topically 2 (two) times daily.

## 2024-03-04 DIAGNOSIS — Z96651 Presence of right artificial knee joint: Secondary | ICD-10-CM | POA: Diagnosis not present

## 2024-03-04 DIAGNOSIS — M2560 Stiffness of unspecified joint, not elsewhere classified: Secondary | ICD-10-CM | POA: Diagnosis not present

## 2024-03-04 DIAGNOSIS — Z7409 Other reduced mobility: Secondary | ICD-10-CM | POA: Diagnosis not present

## 2024-03-04 DIAGNOSIS — R531 Weakness: Secondary | ICD-10-CM | POA: Diagnosis not present

## 2024-03-05 ENCOUNTER — Telehealth: Payer: Self-pay | Admitting: Orthopaedic Surgery

## 2024-03-05 NOTE — Telephone Encounter (Signed)
 Patient called and said that the therapist wanted to know when can she do pool therapy. Her incision is healing good. CB#[phone]

## 2024-03-06 DIAGNOSIS — Z96651 Presence of right artificial knee joint: Secondary | ICD-10-CM | POA: Diagnosis not present

## 2024-03-06 DIAGNOSIS — Z7409 Other reduced mobility: Secondary | ICD-10-CM | POA: Diagnosis not present

## 2024-03-06 DIAGNOSIS — R531 Weakness: Secondary | ICD-10-CM | POA: Diagnosis not present

## 2024-03-06 DIAGNOSIS — M2560 Stiffness of unspecified joint, not elsewhere classified: Secondary | ICD-10-CM | POA: Diagnosis not present

## 2024-03-06 NOTE — Telephone Encounter (Signed)
LMOM of the below message from Dr. Blackman  

## 2024-03-10 ENCOUNTER — Ambulatory Visit: Admitting: Family Medicine

## 2024-03-10 DIAGNOSIS — R531 Weakness: Secondary | ICD-10-CM | POA: Diagnosis not present

## 2024-03-10 DIAGNOSIS — Z7409 Other reduced mobility: Secondary | ICD-10-CM | POA: Diagnosis not present

## 2024-03-10 DIAGNOSIS — Z96651 Presence of right artificial knee joint: Secondary | ICD-10-CM | POA: Diagnosis not present

## 2024-03-10 DIAGNOSIS — M2560 Stiffness of unspecified joint, not elsewhere classified: Secondary | ICD-10-CM | POA: Diagnosis not present

## 2024-03-12 DIAGNOSIS — Z96651 Presence of right artificial knee joint: Secondary | ICD-10-CM | POA: Diagnosis not present

## 2024-03-12 DIAGNOSIS — M2560 Stiffness of unspecified joint, not elsewhere classified: Secondary | ICD-10-CM | POA: Diagnosis not present

## 2024-03-12 DIAGNOSIS — Z7409 Other reduced mobility: Secondary | ICD-10-CM | POA: Diagnosis not present

## 2024-03-12 DIAGNOSIS — R531 Weakness: Secondary | ICD-10-CM | POA: Diagnosis not present

## 2024-03-13 ENCOUNTER — Other Ambulatory Visit: Payer: Self-pay | Admitting: Family Medicine

## 2024-03-13 DIAGNOSIS — I480 Paroxysmal atrial fibrillation: Secondary | ICD-10-CM

## 2024-03-14 ENCOUNTER — Other Ambulatory Visit: Payer: Self-pay | Admitting: Family Medicine

## 2024-03-14 DIAGNOSIS — I480 Paroxysmal atrial fibrillation: Secondary | ICD-10-CM

## 2024-03-14 DIAGNOSIS — R002 Palpitations: Secondary | ICD-10-CM

## 2024-03-17 DIAGNOSIS — M2560 Stiffness of unspecified joint, not elsewhere classified: Secondary | ICD-10-CM | POA: Diagnosis not present

## 2024-03-17 DIAGNOSIS — R531 Weakness: Secondary | ICD-10-CM | POA: Diagnosis not present

## 2024-03-17 DIAGNOSIS — Z7409 Other reduced mobility: Secondary | ICD-10-CM | POA: Diagnosis not present

## 2024-03-17 DIAGNOSIS — Z96651 Presence of right artificial knee joint: Secondary | ICD-10-CM | POA: Diagnosis not present

## 2024-03-18 NOTE — Progress Notes (Unsigned)
 [facility] [address], [address] [place], [place] [address] [phone]  Date:  03/20/2024   Name:  Lisa Acosta   DOB:  July 19, 1951   MRN:  [id number]  PCP:  Watt Harlene BROCKS, MD    Chief Complaint: No chief complaint on file.   History of Present Illness:  Lisa Acosta is a 73 y.o. very pleasant female patient who presents with the following:  Patient seen today to follow-up with concern about her knee.  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 Kassie with pulmonology for her sarcoidosis-I believe her most recent visit was in February Pulmonary sarcoidosis - active flare on immunosuppressants with improved mediastinal adenopathy and respiratory symptoms Chronic cough 2/2 UACS - improved. Followed by ENT --Dx in 05/29/22 and 06/15/22 via endobronchial bx --PET/CT 06/07/22 Hypermetabolic masslike consolidation with mediastinal/hilar adenopathy --CT Chest 02/10/23 Improved LUL consolidation --CT 08/13/23 Stable LUL consolidation but improved lymph nodes --Will repeat CT Chest in 05/2024. Expected end date in June. Imaging will reflect 2-3 months off immunosuppresion History of immunosuppression High risk medication management --Prednisone  30 mg 06/2022-09/2022 . Discontinued d/t agitation, HA. No taper --Methotrexate  started Feb 11/04/22 --Methotrexate  weaning started 11/06/23 --START reducing by 1 pill every 2 weeks starting 11/11/23 --Continue folic acid  2 mg daily. Ok to reduce to 1 pill daily once you are on 10 mg of methotrexate  --If planning for  surgery. Please stop methotrexate  for one week before surgery and the week of surgery. Restart at prior dosing. --ORDER labs as needed: CBC with diff, CMP today and q3 months.  Sarcoid Monitoring --Recent chest imaging reviewed. Plan for repeat imaging after on stable methotrexate  dosing --Annual PFTs.  Last PFTs 09/2022. Mildly reduced DLCO --Annual ophthalmology exam. Last exam 12/26/22 --Followed by EP at [facility] --Routine labs as needed: CBC with diff, CMET, 1, 25 and 25 hydroxy vitamin D , urinary calcium  Nocturnal hypoxemia Hx OSA --CONTINUE 2L O2 via [place] nightly  She did a telephone visit with cardiology for preoperative evaluation in April-they have plan to hold her Xarelto  for 3 days but otherwise she was all set for surgery  Colon cancer screening appears to be due for update.  Assuming her last scope was normal could offer Cologuard Recommend Shingrix, RSV is completed Patient Active Problem List   Diagnosis Date Noted   Status post total right knee replacement 01/15/2024   Unilateral primary osteoarthritis, right knee 10/15/2023   Sarcoidosis 03/08/2023   High risk medication use 03/08/2023   Nocturnal hypoxemia 03/08/2023   Muscle pain 01/09/2023   Generalized obesity 10/12/2022   Class 2 severe obesity with serious comorbidity and body mass index (BMI) of 35.0 to 35.9 in adult [facility]) 08/15/2022   Lung infiltrate    Persistent pneumonia    Hilar adenopathy    Vitamin D  insufficiency 01/10/2021   Cognitive complaints with normal neuropsychological exam 07/29/2020   Memory difficulty 08/18/2019   Right wrist pain 05/04/2019   Mild episode of recurrent major depressive disorder (HCC) 12/11/2018   Depression, major, single episode, mild (HCC) 10/22/2018   Gait abnormality 10/14/2018   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  Moderate episode of recurrent major depressive disorder (HCC) 09/26/2018   Generalized anxiety disorder 09/26/2018   Osteoporosis 01/25/2018   Fatty  liver 08/22/2017   Gallbladder sludge 08/22/2017   Upper abdominal pain 07/19/2017   Gastroesophageal reflux disease with esophagitis 06/01/2017   Encounter for monitoring flecainide  therapy 04/12/2017   Osteoarthritis of hands, 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   Dyspnea 09/22/2016   OSA (obstructive sleep apnea) 08/31/2016   PAF (paroxysmal atrial fibrillation) (HCC) 03/16/2016   Hypothyroid 03/16/2016   Obesity (BMI 30-39.9) 03/16/2016   Skin lesion of left upper extremity 02/05/2016   Essential hypertension 11/10/2015   Hyperlipidemia 11/10/2015   Acute gastritis 06/05/2014   Allergic rhinitis 06/05/2014   Arthritis 06/05/2014   Diaphragmatic hernia 06/05/2014   Difficulty swallowing 06/05/2014   Diverticulosis of colon 06/05/2014   Hyperglycemia 06/05/2014   Impaired fasting glucose 06/05/2014   Insomnia 06/05/2014   Lower back pain 06/05/2014   Osteoarthritis of shoulder 06/05/2014   Pain in joint, pelvic region and thigh 06/05/2014   Pain of right thumb 06/05/2014   Esophageal reflux 03/18/2014   Esophageal ulcer 03/18/2014   Anxiety state 11/25/2013    Past Medical History:  Diagnosis Date   Anxiety    Arthritis    Atrial fibrillation (HCC)    Back pain    Complication of anesthesia    Constipation    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 Fib   Esophageal ulcer    Fatty liver    Medication Induced. 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   Pre-diabetes    Sarcoidosis     Past Surgical History:  Procedure Laterality Date   BREAST BIOPSY Left     needle core biopsy, benign   BRONCHIAL BIOPSY  06/15/2022   Procedure: BRONCHIAL BIOPSIES;  Surgeon: Meade Verdon RAMAN, MD;  Location: [facility] ENDOSCOPY;  Service: Pulmonary;;   BRONCHIAL BIOPSY  06/29/2022   Procedure: BRONCHIAL BIOPSIES;  Surgeon: Brenna Adine CROME, DO;  Location: [facility] ENDOSCOPY;  Service: Pulmonary;;   BRONCHIAL NEEDLE ASPIRATION BIOPSY  06/15/2022   Procedure: BRONCHIAL NEEDLE ASPIRATION BIOPSIES;  Surgeon: Meade Verdon RAMAN, MD;  Location: [facility] ENDOSCOPY;  Service: Pulmonary;;   BRONCHIAL NEEDLE ASPIRATION BIOPSY  06/29/2022   Procedure: BRONCHIAL NEEDLE ASPIRATION BIOPSIES;  Surgeon: Brenna Adine CROME, DO;  Location: [facility] ENDOSCOPY;  Service: Pulmonary;;   BRONCHIAL WASHINGS  06/15/2022   Procedure: BRONCHIAL WASHINGS;  Surgeon: Meade Verdon RAMAN, MD;  Location: [facility] ENDOSCOPY;  Service: Pulmonary;;   CATARACT EXTRACTION W/ INTRAOCULAR LENS IMPLANT     COLONOSCOPY     DILATION AND CURETTAGE OF UTERUS     Miscarriage   HAND SURGERY Left    after a fall   KNEE SURGERY Right    x2   LASIK     ROTATOR CUFF REPAIR Right    x2   TOTAL ABDOMINAL HYSTERECTOMY     TOTAL KNEE ARTHROPLASTY Right 01/15/2024   Procedure: ARTHROPLASTY, KNEE, TOTAL;  Surgeon: Vernetta Lonni GRADE, MD;  Location: [facility]  OR;  Service: Orthopedics;  Laterality: Right;   VIDEO BRONCHOSCOPY N/A 06/15/2022   Procedure: VIDEO BRONCHOSCOPY WITH FLUORO;  Surgeon: Meade Verdon RAMAN, MD;  Location: [facility] ENDOSCOPY;  Service: Pulmonary;  Laterality: N/A;   VIDEO BRONCHOSCOPY WITH ENDOBRONCHIAL ULTRASOUND  06/15/2022   Procedure: VIDEO BRONCHOSCOPY WITH ENDOBRONCHIAL ULTRASOUND;  Surgeon: Meade Verdon RAMAN, MD;  Location: [facility] ENDOSCOPY;  Service: Pulmonary;;   VIDEO BRONCHOSCOPY WITH ENDOBRONCHIAL ULTRASOUND N/A 06/29/2022   Procedure: VIDEO BRONCHOSCOPY WITH ENDOBRONCHIAL ULTRASOUND;  Surgeon: Brenna Adine CROME, DO;  Location: [facility] ENDOSCOPY;  Service: Pulmonary;  Laterality: N/A;    Social History   Tobacco Use   Smoking status: Never     Passive exposure: Current (doesn't smoke in house now or past 36yrs)   Smokeless tobacco: Never  Vaping Use   Vaping status: Never Used  Substance Use Topics   Alcohol  use: Yes    Comment: rare (1-2x per year)   Drug use: No    Family History  Problem Relation Age of Onset   Atrial fibrillation Mother    Congestive Heart Failure Mother    Depression Mother    Heart disease Mother    Stroke Mother    Thyroid  disease Mother    Anxiety disorder Mother    Obesity Mother    Heart disease Father    Alcohol  abuse Father    High blood pressure Father    High Cholesterol Father    Alcoholism Father    Depression Sister    Atrial fibrillation Sister    Heart attack Sister    Heart disease Brother    Heart disease Brother    Depression Daughter     Allergies  Allergen Reactions   Dextran Anaphylaxis   Oxycontin  [Oxycodone ] Other (See Comments)    Hallucinations   Tree Extract    Penicillin G Rash   Penicillins Rash   Sulfa Antibiotics Rash and Other (See Comments)   Wound Dressing Adhesive Rash    Medication list has been reviewed and updated.  Current Outpatient Medications on File Prior to Visit  Medication Sig Dispense Refill   alendronate  (FOSAMAX ) 70 MG tablet Take 1 tablet (70 mg total) by mouth every 7 (seven) days. Take with a full glass of water on an empty stomach. 12 tablet 3   amLODipine  (NORVASC ) 5 MG tablet TAKE 1 TABLET (5 MG TOTAL) BY MOUTH DAILY. 90 tablet 1   atenolol  (TENORMIN ) 50 MG tablet TAKE 1 TABLET BY MOUTH EVERY DAY 90 tablet 1   benzonatate  (TESSALON ) 200 MG capsule Take 1 capsule (200 mg total) by mouth 3 (three) times daily as needed for cough. 60 capsule 2   Calcium  Carb-Cholecalciferol (CALCIUM  600 + D PO) Take 1 tablet by mouth in the morning and at bedtime.     cetirizine  (ZYRTEC ) 10 MG tablet Take 1 tablet (10 mg total) by mouth daily. 30 tablet 11   flecainide  (TAMBOCOR ) 100 MG tablet TAKE 1 TABLET BY MOUTH 2 TIMES A DAY 180 tablet 0    fluconazole  (DIFLUCAN ) 100 MG tablet Take 1 tablet (100 mg total) by mouth daily. 7 tablet 0   fluticasone  (FLONASE ) 50 MCG/ACT nasal spray Place 2 sprays into both nostrils 2 (two) times daily. 16 g 6   folic acid  (FOLVITE ) 1 MG tablet Take 2 tablets (2 mg total) by mouth daily. 180 tablet 3   gabapentin  (NEURONTIN ) 300 MG capsule Take 1 capsule (300 mg total) by mouth at bedtime. 30 capsule 0   guaiFENesin  (MUCINEX )  600 MG 12 hr tablet Take 2 tablets (1,200 mg total) by mouth 2 (two) times daily. (Patient taking differently: Take 1,200 mg by mouth daily.) 30 tablet 0   levothyroxine  (SYNTHROID ) 125 MCG tablet Take 1 tablet (125 mcg total) by mouth daily before breakfast. 90 tablet 1   methocarbamol  (ROBAXIN ) 500 MG tablet Take 1 tablet (500 mg total) by mouth every 6 (six) hours as needed. 40 tablet 1   methotrexate  (RHEUMATREX) 2.5 MG tablet Take 8 tablets (20 mg total) by mouth once a week. Caution:Chemotherapy. Protect from light. 96 tablet 2   Multiple Vitamins-Minerals (PRESERVISION AREDS 2+MULTI VIT PO) Take 1 capsule by mouth daily.     nystatin -triamcinolone  (MYCOLOG II) cream Apply 1 Application topically 2 (two) times daily. 30 g 0   omeprazole  (PRILOSEC) 40 MG capsule Take 1 capsule (40 mg total) by mouth in the morning and at bedtime. (Patient taking differently: Take 40 mg by mouth daily.) 180 capsule 1   ondansetron  (ZOFRAN ) 8 MG tablet Take 1 tablet (8 mg total) by mouth every 8 (eight) hours as needed for nausea or vomiting. 30 tablet 2   Propylene Glycol (SYSTANE BALANCE) 0.6 % SOLN Place 1 drop into both eyes as needed (dry eyes).     rivaroxaban  (XARELTO ) 20 MG TABS tablet TAKE 1 TABLET EVERY DAY WITH SUPPER 30 tablet 11   rosuvastatin  (CRESTOR ) 20 MG tablet Take 1 tablet (20 mg total) by mouth daily. 90 tablet 1   sertraline  (ZOLOFT ) 100 MG tablet Take 1 tablet (100 mg total) by mouth daily. 90 tablet 3   traZODone  (DESYREL ) 50 MG tablet Take 25 mg in the afternoon as needed  for panic.  Take 50 mg at bedtime as needed for insomnia 135 tablet 0   No current facility-administered medications on file prior to visit.    Review of Systems:  ***  Physical Examination: There were no vitals filed for this visit. There were no vitals filed for this visit. There is no height or weight on file to calculate BMI. Ideal Body Weight:    ***  Assessment and Plan: ***  Signed Harlene Schroeder, MD

## 2024-03-19 DIAGNOSIS — M2560 Stiffness of unspecified joint, not elsewhere classified: Secondary | ICD-10-CM | POA: Diagnosis not present

## 2024-03-19 DIAGNOSIS — R531 Weakness: Secondary | ICD-10-CM | POA: Diagnosis not present

## 2024-03-19 DIAGNOSIS — Z96651 Presence of right artificial knee joint: Secondary | ICD-10-CM | POA: Diagnosis not present

## 2024-03-19 DIAGNOSIS — Z7409 Other reduced mobility: Secondary | ICD-10-CM | POA: Diagnosis not present

## 2024-03-20 ENCOUNTER — Ambulatory Visit: Admitting: Orthopaedic Surgery

## 2024-03-20 ENCOUNTER — Encounter: Payer: Self-pay | Admitting: Orthopaedic Surgery

## 2024-03-20 ENCOUNTER — Other Ambulatory Visit (INDEPENDENT_AMBULATORY_CARE_PROVIDER_SITE_OTHER): Payer: Self-pay

## 2024-03-20 ENCOUNTER — Ambulatory Visit (INDEPENDENT_AMBULATORY_CARE_PROVIDER_SITE_OTHER): Admitting: Family Medicine

## 2024-03-20 ENCOUNTER — Encounter: Payer: Self-pay | Admitting: Family Medicine

## 2024-03-20 ENCOUNTER — Other Ambulatory Visit: Payer: Self-pay | Admitting: Orthopaedic Surgery

## 2024-03-20 VITALS — BP 128/74 | HR 60 | Resp 16 | Ht 61.0 in | Wt 177.8 lb

## 2024-03-20 DIAGNOSIS — Z96651 Presence of right artificial knee joint: Secondary | ICD-10-CM | POA: Diagnosis not present

## 2024-03-20 DIAGNOSIS — M25561 Pain in right knee: Secondary | ICD-10-CM | POA: Diagnosis not present

## 2024-03-20 MED ORDER — HYDROCODONE-ACETAMINOPHEN 5-325 MG PO TABS: 1.0000 | ORAL_TABLET | Freq: Four times a day (QID) | ORAL | 0 refills | Status: AC | PRN

## 2024-03-20 NOTE — Patient Instructions (Addendum)
 Please go to your ortho office today for a 1:45 pm appt   [facility] [address] [place], [place] [address] [phone]

## 2024-03-20 NOTE — Progress Notes (Signed)
 The patient is a 73 year old female who is just over 2 months out from a right total knee replacement to treat significant arthritis in her knee.  She was referred over from her primary care physician today Dr. Watt out of the concern for a possible infection of her knee.  The knee is warm but that is to be expected with total knee replacements.  Knees will stay warm for 6 months to a year after knee replacement surgery.  On exam there is no redness when I have the knee elevated and even when I bend it back-and-forth she is having pain but there is no evidence of infection.  The warmth is normal.  She has a lot of posterior and medial pain as well as lateral pain with a knee replacement.  She said they have been able to flex her back further with therapy but she was certainly concerned that she is not making the progress like she would like.  Again, I gave her reassurance that I see no evidence of infection with her knee and that the warmth is appropriate.  I do want to x-ray her knee in the office today.  2 views of the right knee show well-seated total knee arthroplasty with no complicating features.  I want her to go slow with the knee but I gave her reassurance that there is no evidence of infection and the knee is stable.  Ice and Voltaren gel will certainly help and I will send in some hydrocodone  to take sparingly.  Will then see her back in 2 weeks to see how she is doing overall but no x-rays needed.

## 2024-03-26 ENCOUNTER — Encounter: Admitting: Orthopaedic Surgery

## 2024-03-28 DIAGNOSIS — R531 Weakness: Secondary | ICD-10-CM | POA: Diagnosis not present

## 2024-03-28 DIAGNOSIS — M2560 Stiffness of unspecified joint, not elsewhere classified: Secondary | ICD-10-CM | POA: Diagnosis not present

## 2024-03-28 DIAGNOSIS — Z7409 Other reduced mobility: Secondary | ICD-10-CM | POA: Diagnosis not present

## 2024-03-28 DIAGNOSIS — Z96651 Presence of right artificial knee joint: Secondary | ICD-10-CM | POA: Diagnosis not present

## 2024-03-31 ENCOUNTER — Ambulatory Visit (INDEPENDENT_AMBULATORY_CARE_PROVIDER_SITE_OTHER): Payer: Medicare Other | Admitting: Otolaryngology

## 2024-03-31 DIAGNOSIS — R531 Weakness: Secondary | ICD-10-CM | POA: Diagnosis not present

## 2024-03-31 DIAGNOSIS — Z96651 Presence of right artificial knee joint: Secondary | ICD-10-CM | POA: Diagnosis not present

## 2024-03-31 DIAGNOSIS — Z7409 Other reduced mobility: Secondary | ICD-10-CM | POA: Diagnosis not present

## 2024-03-31 DIAGNOSIS — M2560 Stiffness of unspecified joint, not elsewhere classified: Secondary | ICD-10-CM | POA: Diagnosis not present

## 2024-04-02 DIAGNOSIS — Z7409 Other reduced mobility: Secondary | ICD-10-CM | POA: Diagnosis not present

## 2024-04-02 DIAGNOSIS — R531 Weakness: Secondary | ICD-10-CM | POA: Diagnosis not present

## 2024-04-02 DIAGNOSIS — M2560 Stiffness of unspecified joint, not elsewhere classified: Secondary | ICD-10-CM | POA: Diagnosis not present

## 2024-04-02 DIAGNOSIS — Z96651 Presence of right artificial knee joint: Secondary | ICD-10-CM | POA: Diagnosis not present

## 2024-04-03 ENCOUNTER — Encounter: Payer: Self-pay | Admitting: Orthopaedic Surgery

## 2024-04-03 ENCOUNTER — Other Ambulatory Visit: Payer: Self-pay | Admitting: Family Medicine

## 2024-04-03 ENCOUNTER — Ambulatory Visit (INDEPENDENT_AMBULATORY_CARE_PROVIDER_SITE_OTHER): Admitting: Orthopaedic Surgery

## 2024-04-03 DIAGNOSIS — Z96651 Presence of right artificial knee joint: Secondary | ICD-10-CM

## 2024-04-03 NOTE — Progress Notes (Signed)
 The patient is continue to follow-up from her right total knee arthroplasty.  She has had some setbacks during her therapy as a relates to her pain overall.  Several weeks ago she was able to flex to 105 degrees but she is down to flexion to only about 95 degrees.  She reports a lot of lateral pain.  At her last visit 2 weeks ago we did x-ray her right total knee and we did not see any complicating features of the knee replacement.  The knee is less swollen today.  Her extension is full on the right side but I can only flex her to maybe 90 to 95 degrees.  I did feel that it was worthwhile to try a steroid injection in her right knee today and agree with therapy in terms of her pushing through aquatic therapy to get the knee bending and moving.  Will see her in 4 weeks from now to see how she is doing overall and to assess her knee range of motion again.

## 2024-04-07 DIAGNOSIS — M2560 Stiffness of unspecified joint, not elsewhere classified: Secondary | ICD-10-CM | POA: Diagnosis not present

## 2024-04-07 DIAGNOSIS — R531 Weakness: Secondary | ICD-10-CM | POA: Diagnosis not present

## 2024-04-07 DIAGNOSIS — Z7409 Other reduced mobility: Secondary | ICD-10-CM | POA: Diagnosis not present

## 2024-04-07 DIAGNOSIS — Z96651 Presence of right artificial knee joint: Secondary | ICD-10-CM | POA: Diagnosis not present

## 2024-04-09 ENCOUNTER — Telehealth: Payer: Self-pay | Admitting: Cardiology

## 2024-04-09 DIAGNOSIS — Z96651 Presence of right artificial knee joint: Secondary | ICD-10-CM | POA: Diagnosis not present

## 2024-04-09 DIAGNOSIS — Z7409 Other reduced mobility: Secondary | ICD-10-CM | POA: Diagnosis not present

## 2024-04-09 DIAGNOSIS — R531 Weakness: Secondary | ICD-10-CM | POA: Diagnosis not present

## 2024-04-09 DIAGNOSIS — M2560 Stiffness of unspecified joint, not elsewhere classified: Secondary | ICD-10-CM | POA: Diagnosis not present

## 2024-04-09 NOTE — Telephone Encounter (Signed)
Left message for patient with Dr Creshaw's recommendations.   

## 2024-04-09 NOTE — Telephone Encounter (Signed)
 Pt c/o BP issue: STAT if pt c/o blurred vision, one-sided weakness or slurred speech.  STAT if BP is GREATER than 180/120 TODAY.  STAT if BP is LESS than 90/60 and SYMPTOMATIC TODAY  1. What is your BP concern?   Patient stated she is concerned her BP has been trending high but today it is low  2. Have you taken any BP medication today?  No  3. What are your last 5 BP readings?  98/52 - today 157/98  4. Are you having any other symptoms (ex. Dizziness, headache, blurred vision, passed out)?  A lot of fatigue   Patient noted she has been doing physical therapy due to knee replacement surgery.  Patient stated her BP has been trending high around 157/68 but this morning it was 98/52.

## 2024-04-09 NOTE — Telephone Encounter (Signed)
 Spoke with pt regarding his blood pressure. Pt stated her blood pressure has been steady for years 116-124/60s, however for the last week and a half her blood pressure has been 150s/90s. This morning the pt took her blood pressure and it was 98/52 and then 108/55. Pt denied any symptoms other that fatigue. Pt also stated she has felt some fluttering and discomfort in her chest. Pt denied chest pain. Pt also stated she has been having diarrhea and is not drinking very much. Pt stated she does not take her blood pressure regularly. Pt was told to drink fluids and keep a blood pressure log and that the information she provided would be sent to Dr. Pietro and his nurse for their review. Pt verbalized understanding. All questions if any were answered.

## 2024-04-14 DIAGNOSIS — Z7409 Other reduced mobility: Secondary | ICD-10-CM | POA: Diagnosis not present

## 2024-04-14 DIAGNOSIS — R531 Weakness: Secondary | ICD-10-CM | POA: Diagnosis not present

## 2024-04-14 DIAGNOSIS — M2560 Stiffness of unspecified joint, not elsewhere classified: Secondary | ICD-10-CM | POA: Diagnosis not present

## 2024-04-14 DIAGNOSIS — Z96651 Presence of right artificial knee joint: Secondary | ICD-10-CM | POA: Diagnosis not present

## 2024-04-17 DIAGNOSIS — M2560 Stiffness of unspecified joint, not elsewhere classified: Secondary | ICD-10-CM | POA: Diagnosis not present

## 2024-04-17 DIAGNOSIS — R531 Weakness: Secondary | ICD-10-CM | POA: Diagnosis not present

## 2024-04-17 DIAGNOSIS — Z7409 Other reduced mobility: Secondary | ICD-10-CM | POA: Diagnosis not present

## 2024-04-17 DIAGNOSIS — Z96651 Presence of right artificial knee joint: Secondary | ICD-10-CM | POA: Diagnosis not present

## 2024-04-21 DIAGNOSIS — R531 Weakness: Secondary | ICD-10-CM | POA: Diagnosis not present

## 2024-04-21 DIAGNOSIS — Z7409 Other reduced mobility: Secondary | ICD-10-CM | POA: Diagnosis not present

## 2024-04-21 DIAGNOSIS — M2560 Stiffness of unspecified joint, not elsewhere classified: Secondary | ICD-10-CM | POA: Diagnosis not present

## 2024-04-21 DIAGNOSIS — Z96651 Presence of right artificial knee joint: Secondary | ICD-10-CM | POA: Diagnosis not present

## 2024-04-23 DIAGNOSIS — M2560 Stiffness of unspecified joint, not elsewhere classified: Secondary | ICD-10-CM | POA: Diagnosis not present

## 2024-04-23 DIAGNOSIS — Z7409 Other reduced mobility: Secondary | ICD-10-CM | POA: Diagnosis not present

## 2024-04-23 DIAGNOSIS — Z96651 Presence of right artificial knee joint: Secondary | ICD-10-CM | POA: Diagnosis not present

## 2024-04-23 DIAGNOSIS — R531 Weakness: Secondary | ICD-10-CM | POA: Diagnosis not present

## 2024-04-24 ENCOUNTER — Other Ambulatory Visit: Payer: Self-pay | Admitting: Family Medicine

## 2024-04-24 DIAGNOSIS — E7800 Pure hypercholesterolemia, unspecified: Secondary | ICD-10-CM

## 2024-04-25 DIAGNOSIS — R531 Weakness: Secondary | ICD-10-CM | POA: Diagnosis not present

## 2024-04-25 DIAGNOSIS — M2560 Stiffness of unspecified joint, not elsewhere classified: Secondary | ICD-10-CM | POA: Diagnosis not present

## 2024-04-25 DIAGNOSIS — Z7409 Other reduced mobility: Secondary | ICD-10-CM | POA: Diagnosis not present

## 2024-04-25 DIAGNOSIS — Z96651 Presence of right artificial knee joint: Secondary | ICD-10-CM | POA: Diagnosis not present

## 2024-04-28 DIAGNOSIS — R531 Weakness: Secondary | ICD-10-CM | POA: Diagnosis not present

## 2024-04-28 DIAGNOSIS — M2560 Stiffness of unspecified joint, not elsewhere classified: Secondary | ICD-10-CM | POA: Diagnosis not present

## 2024-04-28 DIAGNOSIS — Z96651 Presence of right artificial knee joint: Secondary | ICD-10-CM | POA: Diagnosis not present

## 2024-04-28 DIAGNOSIS — Z7409 Other reduced mobility: Secondary | ICD-10-CM | POA: Diagnosis not present

## 2024-04-29 ENCOUNTER — Encounter: Payer: Self-pay | Admitting: Family Medicine

## 2024-04-30 DIAGNOSIS — M2560 Stiffness of unspecified joint, not elsewhere classified: Secondary | ICD-10-CM | POA: Diagnosis not present

## 2024-04-30 DIAGNOSIS — Z96651 Presence of right artificial knee joint: Secondary | ICD-10-CM | POA: Diagnosis not present

## 2024-04-30 DIAGNOSIS — R531 Weakness: Secondary | ICD-10-CM | POA: Diagnosis not present

## 2024-04-30 DIAGNOSIS — Z7409 Other reduced mobility: Secondary | ICD-10-CM | POA: Diagnosis not present

## 2024-05-02 DIAGNOSIS — Z96651 Presence of right artificial knee joint: Secondary | ICD-10-CM | POA: Diagnosis not present

## 2024-05-02 DIAGNOSIS — Z7409 Other reduced mobility: Secondary | ICD-10-CM | POA: Diagnosis not present

## 2024-05-02 DIAGNOSIS — R531 Weakness: Secondary | ICD-10-CM | POA: Diagnosis not present

## 2024-05-02 DIAGNOSIS — M2560 Stiffness of unspecified joint, not elsewhere classified: Secondary | ICD-10-CM | POA: Diagnosis not present

## 2024-05-02 NOTE — Unmapped External Note (Signed)
 Physical Therapy Progress Note Report & Daily Treatment Note  Payor: MEDICARE / Plan: MEDICARE A&B / Product Type: Medicare /   Demographics:  Age: 73 y.o.  Gender: female  Referring Diagnosis: Z96.651 - Status post total right knee replacement  Referring Clinician:  Vernetta Lonni GRADE*  Initial Evaluation Date: No data recorded Reporting Period:  03/04/24 to 04/07/24 to 05/02/24 Number of Visits to Date: 19 Number of Missed Visits: No data recorded   Therapy Diagnosis:     ICD-10-CM   1. Impaired functional mobility, balance, gait, and endurance  Z74.09   2. Status post total right knee replacement  Z96.651   3. Joint stiffness  M25.60   4. Weakness  R53.1     Rehabilitation Precautions/Restrictions:   Precautions/Restrictions Precautions: Sarcoidosis (Dx per nodule on left lung (CA ruled out);  History of: T12 compression fx    (disc extrusion R L4/5 w/L4n.root impin , Mild DDD L3-4, L5-S1 per MRI 10/09/19);   h/o  Frequent Falls;  VERTIGO;   HEARING IMPAIRMENT;   Seasonal Affective Disorder Restrictions: PLOF/BASELINE:  Hurry cane for community ambulation due to h/o frequent falls;    Intermittent dizziness if flat supine (cervical extension) Better with HOB elevated/2 pillows.    Interval Medical History: n/a  Problem List: Activity tolerance; Fall risk; Joint stiffness; Pain; Functional limitations; Obesity; Patient/family education; Sensation; Strength; Edema; Gait; Knowledge of condition; Proprioception/kinesthetic deficits; Coordination; Endurance; Mobility; Range of motion    SUBJECTIVE Got her Propet sneakers last night  Says her Rt foot is slightly smaller than her Left so she is slipping a little - plans to add heel grip to the shoe that should take care of it  Shoes are comfortable/lightweight. Not having to take Tylenol  or ice her knee at night Pain:    n/a  OBJECTIVE  General Observation:  Ambulating Mod I with rollator walker - wearing Gray soft  mesh construction/white rubber outsole sneakers with laces + Velcro closure - no observed pronation in these shoes. Balance Romberg - Eyes Open/Compliant Surface: 30 Seconds Romberg - Eyes Closed/Compliant Surface: 30 Seconds Romberg - Eyes Open/Non-Compliant Surface: 30 Seconds Romberg - Eyes Closed/Non-Compliant Surface: 30 Seconds Tandem R Leg - Eyes Open/Compliant Surface: 30 Seconds (signficant foot/ankle strategies noted - 30 sec, 2nd attempt - with effort.) Tandem R Leg - Eyes Open/Non-Compliant Surface: 30 Seconds Tandem R Leg - Eyes Closed/Non-Compliant Surface: 2.6 Seconds Tandem Stance L Leg:  (LEFT LEG EXTENDED) Tandem L Leg - Eyes Open/Compliant Surface: 30 Seconds (signficant foot/ankle strategies noted;   30 sec on 2nd attempt) Tandem L Leg - Eyes Open/Non-Compliant Surface: 30 Seconds Tandem L Leg - Eyes Closed/Non-Compliant Surface: 2.3 Seconds Single Leg Stance R Leg:  (RIGHT LEG EXTENDED) Single Leg R - Eyes Open/Non-Compliant Surface: 1 Seconds Single Leg L - Eyes Open/Non-Compliant Surface: 1 Seconds    0-0-99d arc of Right knee AROM on arrival to session (in supine)  Incr. To 103d when seated  (in sneakers with cane on the left) *observe speed of leg advancement slightly faster than arms resulting in cane being left behind and/or hovering every 1-3 steps (ambulating unaided)  Onset of slight stiff kneed gait with decr. Right hip flex/knee flex/ankle DF during swing phase (resulting in midfoot initial contact)  Patient covered 980' (246m/360 sec) = gait speed of .57m/s (mildly slowed  & distance less than community ambulation standards.  Special Tests:  N/A  Outcomes:  PT Outcome Measures    Flowsheet Row Value  KOOS JR-  Stiffness-Amount of joint stiffness experienced during the last week in your knee   How severe is your knee stiffness after first wakening in the morning? 1  KOOS JR - Pain-Amount of knee pain experienced last week during the  following actiivities   Twisting/pivoting on your knee 0  Straightening knee fully 0  Going up or down stairs 1  Standing upright 1  KOOS JR - Function/Daily Living-Degree of difficulty experienced in the last week due to your knee (ability to move around and look after yourself)   Rising from sitting 1  Bending to floor/pick up an object 0  KOOS JR - KOOS Scoring   KOOS Raw Score 4  KOOS Interval Score 76.33     Vs 44.91 at Eval on 03/04/24 -> 63.78 on 07/28 (where the higher the interval score the less pain/stiffness and higher degree of function.   Interventions:  Therapeutic Exercise:26minutes See obj measures  To improve mobility/strength/relieve pain Supine hip knee flex/ext feet atop Pball 5 min Seated ham curls Blue Tband - 30 sec, 4 bouts Seated hip flexor/quad lengthening (at corner EOB (edge of bed)) + active hamstring and glut work into gained range   Investment banker, operational: 8 Minutes To improve gait mechanics to improve efficiency/reduce mechanical stress/relieve pain/maximize safety/minimize fall risk  Counseling for BOS (to narrow Rt lower extremity) & heel strike   Neuromuscular Re-education 15 minutes  To improve motor control/balance/proprioception  See balance testing.    Education: Yes, as described in interventions    ASSESSMENT Patient seen for V#19 of 24 recommended therapy sessions.  Is seeing Orthopedic surgeon next week to discuss POC going forward (MUA vs continued skilled PT progressing to  vs d/c to HEP (home exercise program) at this time.  We did move away from aggressive manual therapy as patient was fear avoidant/traumatized by those methods/not improving with ROM and actually worse pain wise. She has done better with exercise emphasis and incorporating aquatic exercise outside of skilled PT. Residual gait impairments, balance deficits(esp tandem and single leg)/functional limitations compared to PLOF baseline (community ambulation with cane) and  difficulty navigating obstacles - especially compliant surfaces may warrant ongoing skilled PT.  Therapy Program to Date: In summary, the therapy program has included: Patient and/or Caregiver Education, Investment banker, operational, Neuromuscular Re-Ed, Therapeutic Exercises, Therapeutic Activities, Self Care/Home Management, Manual Therapy, and Modalities  Summary of Care Provided during this Interval: Comprehensive post operative rehab status post RTKA (01/15/24)  Responses to Treatment Interventions: Maximum improvement not attained and Additional improvement anticipated within reasonable timeframe  Progress Towards Goals:   Goals Addressed             This Visit's Progress   . PT Goals s/p Rt TKA on 01/15/24       STG to be met within 3 visits by 04/04/24  Pt will have working knowledge of recommended every day HEP to improve overall mobility in order to lessen pain and improve function 7/2: MET  LTG's to be met within established POC (by 05/26/24)   1.  Pt will be independent w/ comprehensive HEP to promote carryover of learned concepts to self care/management of condition upon d/c from skilled PT. 07/28: Add sidelying clams/reverse clams to HEP   2.  Pt's R knee AROM will improve to > = 0-0-125 in order to restore ability to perform transitional movements w/ ease. 07/18: 0-0-85 on arrival -> 100 within session  07/28: 0-0-96 within session - goal ongoing.  3. Pt will demonstrate sufficient LE  strength/motor control to navigate stairs using reciprocal pattern safely/tolerably w/ good overall mechanics.  07/28: NT 08/11:   single 6 step - left lead ascend, Rt lead descend with cane on Left with stand by assist 08/13: Able up to 8 step with left ascend, Rt descend - goal ongoing for reciprocal ...   4.  Restore modified independent (hurry cane PLOF for community distances) and functional gait mechanics for restoration of PLOF. 07/18: rollator (less pain than when using cane prior) 07/28:  Rollator 2/2 less pain and improving mobility since regression from cane. 08/11/25BETHA Roughen for household distances tolerably  08/13: Roughen for household  08/15:  Limited community distances with hurry cane safely.   Goal ongoing for restoration of full community ambulating with cane.   5. Pt will demonstrate maximized static/dynamic balance as close to 30 sec as possible for all conditions of modified CTSIB  in order to minimize fall risk.  07/28: NT 2/2 knee pain  6. Patients KOOS score will improve by Minimum clinically important difference (MCID) of >=  8-10 points  (from 44.91 at initial eval to >= 55.91**) as evidence of lessened pain (related to TKA) and improved functional mobility/activity tolerance.  07/28: 63.78  **Goal MET            PLAN Treatment Frequency and Duration:   **patient will call to advise of MD recommendations after consult on 05/05/24. If continued PT recommended will call to schedule.  Treatment Plan Details: up to 3v/wk xs up to 24v POC 06/24 - 05/26/24  Recommended PT Treatment/Interventions: ADL skills [phone]); Electrical stimulation-unattended (02985); Electrical stimulation-attended (02967); Neuromuscular re-education [phone]); Therapeutic activity (97530); Therapeutic exercise (97110); Manual therapy (97140); Gait training (02883); Self-care/home management [phone]); Canalith Re-positioning (04007) (**pt has known h/o vertigo - included CRP in POC in the event pt experiences an episode we can help.    Heat/cold modalities, FDA approved cold laser, Pilates based rehab)   Recommended Consults:  Orthopedics for regularly scheduled f/u 05/05/24   Development of Plan of Care:  No change in POC.  Total Treatment Time (Time & Untimed): Total Treatment Time: 46 Total Time in Timed Codes: Time in Timed Codes: 46     Treatment/Procedures Gait Training minutes: 8 Neuromuscular Reeducation minutes: 15 Therapeutic Exercises minutes: 23              The  patient has been instructed to contact our clinic if any questions or problems should arise.  With patient's informed verbal consent, student physical therapist Massie Hoist, SPT was present and involved in the delivery of care as directed by this therapist.

## 2024-05-05 ENCOUNTER — Encounter: Payer: Self-pay | Admitting: Orthopaedic Surgery

## 2024-05-05 ENCOUNTER — Ambulatory Visit (INDEPENDENT_AMBULATORY_CARE_PROVIDER_SITE_OTHER): Admitting: Orthopaedic Surgery

## 2024-05-05 DIAGNOSIS — Z96651 Presence of right artificial knee joint: Secondary | ICD-10-CM

## 2024-05-05 NOTE — Progress Notes (Signed)
 The patient is now making much better progress with her range of motion and going to physical therapy with her right knee at just past 3 months status post a right total knee replacement.  Physical therapy is really doing a good job with pushing her along and I do feel that she would benefit from her remaining 5 sessions of PT.  When we saw her last we did place a steroid injection in her right knee and she said that has helped and aquatic therapy has also helped as well as hands-on physical therapy.  She is now ambulating with a cane and overall just she looks better.  On exam she has good extension of her right knee and I can flex her to just past 90 degrees and I think this is good progress and I think it be too traumatic to try to put her through manipulation considering she is making progress.  She agrees with this as well.  Will see her back in 3 months to see how she is doing overall.  I agree with her having 5 more therapy sessions because I think this would be medically and clinically necessary to help her to continue to improve since she is making improvements.  At her 78-month visit we will have an AP and lateral of her right operative knee.

## 2024-05-07 ENCOUNTER — Ambulatory Visit (INDEPENDENT_AMBULATORY_CARE_PROVIDER_SITE_OTHER): Admitting: Otolaryngology

## 2024-05-09 ENCOUNTER — Encounter: Payer: Self-pay | Admitting: Family

## 2024-05-09 ENCOUNTER — Ambulatory Visit (INDEPENDENT_AMBULATORY_CARE_PROVIDER_SITE_OTHER): Admitting: Family

## 2024-05-09 VITALS — BP 110/64 | HR 59 | Ht 61.0 in | Wt 177.0 lb

## 2024-05-09 DIAGNOSIS — H6121 Impacted cerumen, right ear: Secondary | ICD-10-CM | POA: Diagnosis not present

## 2024-05-09 MED ORDER — NEOMYCIN-POLYMYXIN-HC 3.5-10000-1 OT SOLN: 3.0000 [drp] | Freq: Three times a day (TID) | OTIC | 0 refills | Status: AC

## 2024-05-09 NOTE — Progress Notes (Signed)
 Lisa Acosta is a 73 y.o. female with the following history as recorded in EpicCare:  Patient Active Problem List   Diagnosis Date Noted   Status post total right knee replacement 01/15/2024   Unilateral primary osteoarthritis, right knee 10/15/2023   Sarcoidosis 03/08/2023   High risk medication use 03/08/2023   Nocturnal hypoxemia 03/08/2023   Muscle pain 01/09/2023   Generalized obesity 10/12/2022   Class 2 severe obesity with serious comorbidity and body mass index (BMI) of 35.0 to 35.9 in adult [facility]) 08/15/2022   Lung infiltrate    Persistent pneumonia    Hilar adenopathy    Vitamin D  insufficiency 01/10/2021   Cognitive complaints with normal neuropsychological exam 07/29/2020   Memory difficulty 08/18/2019   Right wrist pain 05/04/2019   Mild episode of recurrent major depressive disorder (HCC) 12/11/2018   Depression, major, single episode, mild (HCC) 10/22/2018   Gait abnormality 10/14/2018   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   Moderate episode of recurrent major depressive disorder (HCC) 09/26/2018   Generalized anxiety disorder 09/26/2018   Osteoporosis 01/25/2018   Fatty liver 08/22/2017   Gallbladder sludge 08/22/2017   Upper abdominal pain 07/19/2017   Gastroesophageal reflux disease with esophagitis 06/01/2017   Encounter for monitoring flecainide  therapy 04/12/2017   Osteoarthritis of hands, 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   Dyspnea 09/22/2016   OSA (obstructive sleep apnea) 08/31/2016   PAF (paroxysmal atrial fibrillation) (HCC) 03/16/2016   Hypothyroid 03/16/2016   Obesity (BMI 30-39.9) 03/16/2016   Skin lesion of left upper extremity 02/05/2016   Essential hypertension 11/10/2015   Hyperlipidemia 11/10/2015   Acute gastritis 06/05/2014   Allergic rhinitis 06/05/2014   Arthritis 06/05/2014   Diaphragmatic hernia 06/05/2014   Difficulty swallowing 06/05/2014   Diverticulosis of colon 06/05/2014   Hyperglycemia 06/05/2014   Impaired fasting glucose  06/05/2014   Insomnia 06/05/2014   Lower back pain 06/05/2014   Osteoarthritis of shoulder 06/05/2014   Pain in joint, pelvic region and thigh 06/05/2014   Pain of right thumb 06/05/2014   Esophageal reflux 03/18/2014   Esophageal ulcer 03/18/2014   Anxiety state 11/25/2013    Current Outpatient Medications  Medication Sig Dispense Refill   alendronate  (FOSAMAX ) 70 MG tablet Take 1 tablet (70 mg total) by mouth every 7 (seven) days. Take with a full glass of water on an empty stomach. 12 tablet 3   amLODipine  (NORVASC ) 5 MG tablet TAKE 1 TABLET (5 MG TOTAL) BY MOUTH DAILY. 90 tablet 1   atenolol  (TENORMIN ) 50 MG tablet TAKE 1 TABLET BY MOUTH EVERY DAY 90 tablet 1   benzonatate  (TESSALON ) 200 MG capsule Take 1 capsule (200 mg total) by mouth 3 (three) times daily as needed for cough. 60 capsule 2   Calcium  Carb-Cholecalciferol (CALCIUM  600 + D PO) Take 1 tablet by mouth in the morning and at bedtime.     cetirizine  (ZYRTEC ) 10 MG tablet Take 1 tablet (10 mg total) by mouth daily. 30 tablet 11   flecainide  (TAMBOCOR ) 100 MG tablet TAKE 1 TABLET BY MOUTH 2 TIMES A DAY 180 tablet 0   fluticasone  (FLONASE ) 50 MCG/ACT nasal spray Place 2 sprays into both nostrils 2 (two) times daily. 16 g 6   guaiFENesin  (MUCINEX ) 600 MG 12 hr tablet Take 2 tablets (1,200 mg total) by mouth 2 (two) times daily. (Patient taking differently: Take 1,200 mg by mouth daily.) 30 tablet 0   HYDROcodone -acetaminophen  (NORCO/VICODIN) 5-325 MG tablet Take 1 tablet by mouth every 6 (six) hours as needed for moderate pain (  pain score 4-6). 30 tablet 0   levothyroxine  (SYNTHROID ) 125 MCG tablet Take 1 tablet (125 mcg total) by mouth daily before breakfast. 90 tablet 1   methocarbamol  (ROBAXIN ) 500 MG tablet Take 1 tablet (500 mg total) by mouth every 6 (six) hours as needed. 40 tablet 1   methotrexate  (RHEUMATREX) 2.5 MG tablet Take 8 tablets (20 mg total) by mouth once a week. Caution:Chemotherapy. Protect from light. 96  tablet 2   Multiple Vitamins-Minerals (PRESERVISION AREDS 2+MULTI VIT PO) Take 1 capsule by mouth daily.     neomycin -polymyxin-hydrocortisone (CORTISPORIN) OTIC solution Place 3 drops into the right ear 3 (three) times daily for 3 days. 10 mL 0   nystatin -triamcinolone  (MYCOLOG II) cream Apply 1 Application topically 2 (two) times daily. 30 g 0   omeprazole  (PRILOSEC) 40 MG capsule Take 1 capsule (40 mg total) by mouth in the morning and at bedtime. (Patient taking differently: Take 40 mg by mouth daily.) 180 capsule 1   ondansetron  (ZOFRAN ) 8 MG tablet Take 1 tablet (8 mg total) by mouth every 8 (eight) hours as needed for nausea or vomiting. 30 tablet 2   Propylene Glycol (SYSTANE BALANCE) 0.6 % SOLN Place 1 drop into both eyes as needed (dry eyes).     rivaroxaban  (XARELTO ) 20 MG TABS tablet TAKE 1 TABLET EVERY DAY WITH SUPPER 30 tablet 11   rosuvastatin  (CRESTOR ) 20 MG tablet Take 1 tablet (20 mg total) by mouth daily. 90 tablet 1   sertraline  (ZOLOFT ) 100 MG tablet Take 1 tablet (100 mg total) by mouth daily. 90 tablet 3   traZODone  (DESYREL ) 50 MG tablet Take 25 mg in the afternoon as needed for panic.  Take 50 mg at bedtime as needed for insomnia 135 tablet 0   fluconazole  (DIFLUCAN ) 100 MG tablet Take 1 tablet (100 mg total) by mouth daily. (Patient not taking: Reported on 05/09/2024) 7 tablet 0   folic acid  (FOLVITE ) 1 MG tablet Take 2 tablets (2 mg total) by mouth daily. (Patient not taking: Reported on 05/09/2024) 180 tablet 3   gabapentin  (NEURONTIN ) 300 MG capsule TAKE 1 CAPSULE BY MOUTH EVERYDAY AT BEDTIME (Patient not taking: Reported on 05/09/2024) 30 capsule 0   No current facility-administered medications for this visit.    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 Fib   Esophageal ulcer    Fatty liver    Medication Induced. 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   Pre-diabetes    Sarcoidosis     Past Surgical History:  Procedure Laterality Date   BREAST BIOPSY Left    needle core biopsy, benign   BRONCHIAL BIOPSY  06/15/2022   Procedure: BRONCHIAL BIOPSIES;  Surgeon: Meade Verdon RAMAN, MD;  Location: [facility] ENDOSCOPY;  Service: Pulmonary;;   BRONCHIAL BIOPSY  06/29/2022   Procedure: BRONCHIAL BIOPSIES;  Surgeon: Brenna Adine CROME, DO;  Location: [facility] ENDOSCOPY;  Service: Pulmonary;;   BRONCHIAL NEEDLE ASPIRATION BIOPSY  06/15/2022   Procedure: BRONCHIAL NEEDLE ASPIRATION BIOPSIES;  Surgeon: Meade Verdon RAMAN, MD;  Location: [facility] ENDOSCOPY;  Service: Pulmonary;;   BRONCHIAL NEEDLE ASPIRATION BIOPSY  06/29/2022   Procedure: BRONCHIAL NEEDLE ASPIRATION BIOPSIES;  Surgeon: Brenna Adine CROME, DO;  Location: [facility] ENDOSCOPY;  Service: Pulmonary;;   BRONCHIAL WASHINGS  06/15/2022   Procedure: BRONCHIAL WASHINGS;  Surgeon: Meade Verdon RAMAN, MD;  Location: [facility] ENDOSCOPY;  Service: Pulmonary;;   CATARACT EXTRACTION W/ INTRAOCULAR LENS IMPLANT     COLONOSCOPY     DILATION AND CURETTAGE OF UTERUS     Miscarriage   HAND SURGERY Left    after a fall   KNEE SURGERY Right    x2   LASIK     ROTATOR CUFF REPAIR Right    x2   TOTAL ABDOMINAL HYSTERECTOMY     TOTAL KNEE ARTHROPLASTY Right 01/15/2024   Procedure: ARTHROPLASTY, KNEE, TOTAL;  Surgeon: Vernetta Lonni GRADE, MD;  Location:  [facility] OR;  Service: Orthopedics;  Laterality: Right;   VIDEO BRONCHOSCOPY N/A 06/15/2022   Procedure: VIDEO BRONCHOSCOPY WITH FLUORO;  Surgeon: Meade Verdon RAMAN, MD;  Location: [facility] ENDOSCOPY;  Service: Pulmonary;  Laterality: N/A;   VIDEO BRONCHOSCOPY WITH ENDOBRONCHIAL ULTRASOUND  06/15/2022   Procedure: VIDEO BRONCHOSCOPY WITH ENDOBRONCHIAL ULTRASOUND;  Surgeon: Meade Verdon RAMAN, MD;  Location: [facility] ENDOSCOPY;  Service: Pulmonary;;   VIDEO BRONCHOSCOPY WITH ENDOBRONCHIAL ULTRASOUND N/A 06/29/2022   Procedure: VIDEO BRONCHOSCOPY WITH ENDOBRONCHIAL ULTRASOUND;  Surgeon: Brenna Adine CROME, DO;  Location: [facility] ENDOSCOPY;  Service: Pulmonary;  Laterality: N/A;    Family History  Problem Relation Age of Onset   Atrial fibrillation Mother    Congestive Heart Failure Mother    Depression Mother    Heart disease Mother    Stroke Mother    Thyroid  disease Mother    Anxiety disorder Mother    Obesity Mother    Heart disease Father    Alcohol  abuse Father    High blood pressure Father    High Cholesterol Father    Alcoholism Father    Depression Sister    Atrial fibrillation Sister    Heart attack Sister    Heart disease Brother    Heart disease Brother    Depression Daughter     Social History   Tobacco Use   Smoking status: Never    Passive exposure: Current (doesn't smoke in house now or past 67yrs)   Smokeless tobacco: Never  Substance Use Topics   Alcohol  use: Yes    Comment: rare (1-2x per year)    Subjective:   Requesting ear lavage of right ear; needs to get new hearing aids and was told could not be fitted until had impaction treated; does have recurrent need to get right ear flushed;    Objective:  Vitals:   05/09/24 1108  BP: 110/64  Pulse: (!) 59  SpO2: 94%  Weight: 177 lb (80.3 kg)  Height: 5' 1 (1.549 m)    General: Well developed, well nourished, in no acute distress  Skin : Warm and dry.  Head: Normocephalic and atraumatic  Eyes: Sclera and conjunctiva clear;  pupils round and reactive to light; extraocular movements intact  Ears: External mildly erythematous; after lavage, canals clear; tympanic membranes normal  Oropharynx: Pink, supple. No suspicious lesions  Neck: Supple without thyromegaly, adenopathy  Lungs: Respirations unlabored;  Neurologic: Alert and oriented; speech intact; face symmetrical; moves all extremities well; CNII-XII intact without focal deficit   Assessment:  1. Impacted cerumen of right ear     Plan:  Ear lavage completed with no complication; can use Cortisporin Otic drops to help with canal irritation; follow up with her Pcp as needed otherwise.   No follow-ups on file.  No orders of the defined types were placed in this encounter.   Requested Prescriptions   Signed Prescriptions Disp Refills   neomycin -polymyxin-hydrocortisone (CORTISPORIN) OTIC solution 10 mL 0    Sig: Place 3 drops into the right ear 3 (three) times daily for 3 days.

## 2024-05-13 DIAGNOSIS — R531 Weakness: Secondary | ICD-10-CM | POA: Diagnosis not present

## 2024-05-13 DIAGNOSIS — M2560 Stiffness of unspecified joint, not elsewhere classified: Secondary | ICD-10-CM | POA: Diagnosis not present

## 2024-05-13 DIAGNOSIS — Z96651 Presence of right artificial knee joint: Secondary | ICD-10-CM | POA: Diagnosis not present

## 2024-05-13 DIAGNOSIS — Z7409 Other reduced mobility: Secondary | ICD-10-CM | POA: Diagnosis not present

## 2024-05-16 DIAGNOSIS — R531 Weakness: Secondary | ICD-10-CM | POA: Diagnosis not present

## 2024-05-16 DIAGNOSIS — M2560 Stiffness of unspecified joint, not elsewhere classified: Secondary | ICD-10-CM | POA: Diagnosis not present

## 2024-05-16 DIAGNOSIS — Z96651 Presence of right artificial knee joint: Secondary | ICD-10-CM | POA: Diagnosis not present

## 2024-05-16 DIAGNOSIS — Z7409 Other reduced mobility: Secondary | ICD-10-CM | POA: Diagnosis not present

## 2024-05-19 DIAGNOSIS — M2560 Stiffness of unspecified joint, not elsewhere classified: Secondary | ICD-10-CM | POA: Diagnosis not present

## 2024-05-19 DIAGNOSIS — Z7409 Other reduced mobility: Secondary | ICD-10-CM | POA: Diagnosis not present

## 2024-05-19 DIAGNOSIS — Z96651 Presence of right artificial knee joint: Secondary | ICD-10-CM | POA: Diagnosis not present

## 2024-05-19 DIAGNOSIS — R531 Weakness: Secondary | ICD-10-CM | POA: Diagnosis not present

## 2024-05-21 ENCOUNTER — Ambulatory Visit: Payer: Self-pay

## 2024-05-21 DIAGNOSIS — R531 Weakness: Secondary | ICD-10-CM | POA: Diagnosis not present

## 2024-05-21 DIAGNOSIS — M2560 Stiffness of unspecified joint, not elsewhere classified: Secondary | ICD-10-CM | POA: Diagnosis not present

## 2024-05-21 DIAGNOSIS — Z7409 Other reduced mobility: Secondary | ICD-10-CM | POA: Diagnosis not present

## 2024-05-21 DIAGNOSIS — Z96651 Presence of right artificial knee joint: Secondary | ICD-10-CM | POA: Diagnosis not present

## 2024-05-21 NOTE — Telephone Encounter (Signed)
 FYI Only or Action Required?: FYI only for provider.  Patient was last seen in primary care on 05/09/2024 by Jason Leita Repine, FNP.  Called Nurse Triage reporting Pruritis.  Symptoms began ongoing since dx w/ear infection.  Interventions attempted: Prescription medications: ear infection drops.  Symptoms are: gradually worsening.  Triage Disposition: See PCP When Office is Open (Within 3 Days)  Patient/caregiver understands and will follow disposition?: Yes   Pt was seen last week for ear wax build up and irritation. She was prescribed ear drops to treat the redness after having it cleaned out. Pt states her ear is now very itchy and she doesn't have anymore of the drops   Reason for Disposition  [1] MODERATE-SEVERE local itching (i.e., interferes with work, school, activities) AND [2] not improved after 24 hours of hydrocortisone cream  Answer Assessment - Initial Assessment Questions 1. DESCRIPTION: Describe the itching you are having. Where is it located Right ear 2. SEVERITY: How bad is it?      moderate 3. SCRATCHING: Are there any scratch marks? Bleeding?     no 4. ONSET: When did the itching begin? (e.g., minutes, hours, days ago)     Ongoing since last office visit for ear 5. CAUSE: What do you think is causing the itching?      Ear infection 6. OTHER SYMPTOMS: Do you have any other symptoms? (e.g., fever, rash)     no 7. PREGNANCY: Is there any chance you are pregnant? When was your last menstrual period?     Na  Pt was given drops to place in ear; pt has been doing so: dizziness is gone, no pain - however itching in right ear has increased.  Protocols used: Itching - Localized-A-AH

## 2024-05-21 NOTE — Telephone Encounter (Signed)
 Appt tomorrow

## 2024-05-22 ENCOUNTER — Encounter: Payer: Self-pay | Admitting: Family

## 2024-05-22 ENCOUNTER — Ambulatory Visit (INDEPENDENT_AMBULATORY_CARE_PROVIDER_SITE_OTHER): Admitting: Family

## 2024-05-22 VITALS — BP 120/74 | HR 58 | Ht 61.0 in

## 2024-05-22 DIAGNOSIS — H61891 Other specified disorders of right external ear: Secondary | ICD-10-CM | POA: Diagnosis not present

## 2024-05-22 NOTE — Progress Notes (Signed)
 Lisa Acosta is a 72 y.o. female with the following history as recorded in Epic[employer]:  Patient Active Problem List   Diagnosis Date Noted   Status post total right knee replacement 01/15/2024   Unilateral primary osteoarthritis, right knee 10/15/2023   Sarcoidosis 03/08/2023   High risk medication use 03/08/2023   Nocturnal hypoxemia 03/08/2023   Muscle pain 01/09/2023   Generalized obesity 10/12/2022   Class 2 severe obesity with serious comorbidity and body mass index (BMI) of 35.0 to 35.9 in adult [facility]) 08/15/2022   Lung infiltrate    Persistent pneumonia    Hilar adenopathy    Vitamin D  insufficiency 01/10/2021   Cognitive complaints with normal neuropsychological exam 07/29/2020   Memory difficulty 08/18/2019   Right wrist pain 05/04/2019   Mild episode of recurrent major depressive disorder (HCC) 12/11/2018   Depression, major, single episode, mild (HCC) 10/22/2018   Gait abnormality 10/14/2018   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   Moderate episode of recurrent major depressive disorder (HCC) 09/26/2018   Generalized anxiety disorder 09/26/2018   Osteoporosis 01/25/2018   Fatty liver 08/22/2017   Gallbladder sludge 08/22/2017   Upper abdominal pain 07/19/2017   Gastroesophageal reflux disease with esophagitis 06/01/2017   Encounter for monitoring flecainide  therapy 04/12/2017   Osteoarthritis of hands, 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   Dyspnea 09/22/2016   OSA (obstructive sleep apnea) 08/31/2016   PAF (paroxysmal atrial fibrillation) (HCC) 03/16/2016   Hypothyroid 03/16/2016   Obesity (BMI 30-39.9) 03/16/2016   Skin lesion of left upper extremity 02/05/2016   Essential hypertension 11/10/2015   Hyperlipidemia 11/10/2015   Acute gastritis 06/05/2014   Allergic rhinitis 06/05/2014   Arthritis 06/05/2014   Diaphragmatic hernia 06/05/2014   Difficulty swallowing 06/05/2014   Diverticulosis of colon 06/05/2014   Hyperglycemia 06/05/2014   Impaired fasting glucose  06/05/2014   Insomnia 06/05/2014   Lower back pain 06/05/2014   Osteoarthritis of shoulder 06/05/2014   Pain in joint, pelvic region and thigh 06/05/2014   Pain of right thumb 06/05/2014   Esophageal reflux 03/18/2014   Esophageal ulcer 03/18/2014   Anxiety state 11/25/2013    Current Outpatient Medications  Medication Sig Dispense Refill   alendronate  (FOSAMAX ) 70 MG tablet Take 1 tablet (70 mg total) by mouth every 7 (seven) days. Take with a full glass of water on an empty stomach. 12 tablet 3   amLODipine  (NORVASC ) 5 MG tablet TAKE 1 TABLET (5 MG TOTAL) BY MOUTH DAILY. 90 tablet 1   atenolol  (TENORMIN ) 50 MG tablet TAKE 1 TABLET BY MOUTH EVERY DAY 90 tablet 1   benzonatate  (TESSALON ) 200 MG capsule Take 1 capsule (200 mg total) by mouth 3 (three) times daily as needed for cough. 60 capsule 2   Calcium  Carb-Cholecalciferol (CALCIUM  600 + D PO) Take 1 tablet by mouth in the morning and at bedtime.     cetirizine  (ZYRTEC ) 10 MG tablet Take 1 tablet (10 mg total) by mouth daily. 30 tablet 11   flecainide  (TAMBOCOR ) 100 MG tablet TAKE 1 TABLET BY MOUTH 2 TIMES A DAY 180 tablet 0   fluticasone  (FLONASE ) 50 MCG/ACT nasal spray Place 2 sprays into both nostrils 2 (two) times daily. 16 g 6   guaiFENesin  (MUCINEX ) 600 MG 12 hr tablet Take 2 tablets (1,200 mg total) by mouth 2 (two) times daily. (Patient taking differently: Take 1,200 mg by mouth daily.) 30 tablet 0   HYDROcodone -acetaminophen  (NORCO/VICODIN) 5-325 MG tablet Take 1 tablet by mouth every 6 (six) hours as needed for moderate pain (  pain score 4-6). 30 tablet 0   levothyroxine  (SYNTHROID ) 125 MCG tablet Take 1 tablet (125 mcg total) by mouth daily before breakfast. 90 tablet 1   methocarbamol  (ROBAXIN ) 500 MG tablet Take 1 tablet (500 mg total) by mouth every 6 (six) hours as needed. 40 tablet 1   methotrexate  (RHEUMATREX) 2.5 MG tablet Take 8 tablets (20 mg total) by mouth once a week. Caution:Chemotherapy. Protect from light. 96  tablet 2   Multiple Vitamins-Minerals (PRESERVISION AREDS 2+MULTI VIT PO) Take 1 capsule by mouth daily.     nystatin -triamcinolone  (MYCOLOG II) cream Apply 1 Application topically 2 (two) times daily. 30 g 0   omeprazole  (PRILOSEC) 40 MG capsule Take 1 capsule (40 mg total) by mouth in the morning and at bedtime. (Patient taking differently: Take 40 mg by mouth daily.) 180 capsule 1   ondansetron  (ZOFRAN ) 8 MG tablet Take 1 tablet (8 mg total) by mouth every 8 (eight) hours as needed for nausea or vomiting. 30 tablet 2   Propylene Glycol (SYSTANE BALANCE) 0.6 % SOLN Place 1 drop into both eyes as needed (dry eyes).     rivaroxaban  (XARELTO ) 20 MG TABS tablet TAKE 1 TABLET EVERY DAY WITH SUPPER 30 tablet 11   rosuvastatin  (CRESTOR ) 20 MG tablet Take 1 tablet (20 mg total) by mouth daily. 90 tablet 1   sertraline  (ZOLOFT ) 100 MG tablet Take 1 tablet (100 mg total) by mouth daily. 90 tablet 3   traZODone  (DESYREL ) 50 MG tablet Take 25 mg in the afternoon as needed for panic.  Take 50 mg at bedtime as needed for insomnia 135 tablet 0   fluconazole  (DIFLUCAN ) 100 MG tablet Take 1 tablet (100 mg total) by mouth daily. (Patient not taking: Reported on 05/22/2024) 7 tablet 0   folic acid  (FOLVITE ) 1 MG tablet Take 2 tablets (2 mg total) by mouth daily. (Patient not taking: Reported on 05/22/2024) 180 tablet 3   gabapentin  (NEURONTIN ) 300 MG capsule TAKE 1 CAPSULE BY MOUTH EVERYDAY AT BEDTIME (Patient not taking: Reported on 05/22/2024) 30 capsule 0   No current facility-administered medications for this visit.    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 Fib   Esophageal ulcer    Fatty liver    Medication Induced. 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   Pre-diabetes    Sarcoidosis     Past Surgical History:  Procedure Laterality Date   BREAST BIOPSY Left    needle core biopsy, benign   BRONCHIAL BIOPSY  06/15/2022   Procedure: BRONCHIAL BIOPSIES;  Surgeon: Meade Verdon RAMAN, MD;  Location: [facility] ENDOSCOPY;  Service: Pulmonary;;   BRONCHIAL BIOPSY  06/29/2022   Procedure: BRONCHIAL  BIOPSIES;  Surgeon: Brenna Adine CROME, DO;  Location: [facility] ENDOSCOPY;  Service: Pulmonary;;   BRONCHIAL NEEDLE ASPIRATION BIOPSY  06/15/2022   Procedure: BRONCHIAL NEEDLE ASPIRATION BIOPSIES;  Surgeon: Meade Verdon RAMAN, MD;  Location: [facility] ENDOSCOPY;  Service: Pulmonary;;   BRONCHIAL NEEDLE ASPIRATION BIOPSY  06/29/2022   Procedure: BRONCHIAL NEEDLE ASPIRATION BIOPSIES;  Surgeon: Brenna Adine CROME, DO;  Location: [facility] ENDOSCOPY;  Service: Pulmonary;;   BRONCHIAL WASHINGS  06/15/2022   Procedure: BRONCHIAL WASHINGS;  Surgeon: Meade Verdon RAMAN, MD;  Location: [facility] ENDOSCOPY;  Service: Pulmonary;;   CATARACT EXTRACTION W/ INTRAOCULAR LENS IMPLANT     COLONOSCOPY     DILATION AND CURETTAGE OF UTERUS     Miscarriage   HAND SURGERY Left    after a fall   KNEE SURGERY Right    x2   LASIK     ROTATOR CUFF REPAIR Right    x2   TOTAL ABDOMINAL HYSTERECTOMY     TOTAL KNEE ARTHROPLASTY Right 01/15/2024   Procedure: ARTHROPLASTY, KNEE, TOTAL;  Surgeon: Vernetta Lonni GRADE, MD;  Location: [facility] OR;  Service: Orthopedics;  Laterality: Right;   VIDEO BRONCHOSCOPY N/A 06/15/2022   Procedure: VIDEO BRONCHOSCOPY WITH FLUORO;  Surgeon:  Meade Verdon RAMAN, MD;  Location: [facility] ENDOSCOPY;  Service: Pulmonary;  Laterality: N/A;   VIDEO BRONCHOSCOPY WITH ENDOBRONCHIAL ULTRASOUND  06/15/2022   Procedure: VIDEO BRONCHOSCOPY WITH ENDOBRONCHIAL ULTRASOUND;  Surgeon: Meade Verdon RAMAN, MD;  Location: [facility] ENDOSCOPY;  Service: Pulmonary;;   VIDEO BRONCHOSCOPY WITH ENDOBRONCHIAL ULTRASOUND N/A 06/29/2022   Procedure: VIDEO BRONCHOSCOPY WITH ENDOBRONCHIAL ULTRASOUND;  Surgeon: Brenna Adine CROME, DO;  Location: [facility] ENDOSCOPY;  Service: Pulmonary;  Laterality: N/A;    Family History  Problem Relation Age of Onset   Atrial fibrillation Mother    Congestive Heart Failure Mother    Depression Mother    Heart disease Mother    Stroke Mother    Thyroid  disease Mother    Anxiety disorder Mother    Obesity Mother    Heart disease Father    Alcohol  abuse Father    High blood pressure Father    High Cholesterol Father    Alcoholism Father    Depression Sister    Atrial fibrillation Sister    Heart attack Sister    Heart disease Brother    Heart disease Brother    Depression Daughter     Social History   Tobacco Use   Smoking status: Never    Passive exposure: Current (doesn't smoke in house now or past 60yrs)   Smokeless tobacco: Never  Substance Use Topics   Alcohol  use: Yes    Comment: rare (1-2x per year)    Subjective:   Requesting re-check of right ear before getting new hearing aids; had ear lavage 3 weeks ago and was given Cortisporin due to noted redness/ irritation; she has used for a few days and does feel better but wanted to be sure before hearing aids are placed next week.   Objective:  Vitals:   05/22/24 1605  BP: 120/74  Pulse: (!) 58  SpO2: 96%  Height: 5' 1 (1.549 m)    General: Well developed, well nourished, in no acute distress  Skin : Warm and dry.  Head: Normocephalic and atraumatic  Eyes: Sclera and conjunctiva clear; pupils round and reactive to light; extraocular movements intact  Ears: External normal;  canals clear; tympanic membranes normal  Lungs: Respirations unlabored;  Neurologic: Alert and oriented; speech intact; face symmetrical; uses rolling walker;  Assessment:  1. Irritation of right external auditory canal     Plan:  Improved from previous visit; reassurance- can use Cortisporin for another 3-5 days and do think she will be fine to get new hearing aids.   No follow-ups on file.  No orders of the defined types were placed in this encounter.   Requested Prescriptions    No prescriptions requested or ordered in this encounter

## 2024-05-23 ENCOUNTER — Ambulatory Visit (INDEPENDENT_AMBULATORY_CARE_PROVIDER_SITE_OTHER): Admitting: Otolaryngology

## 2024-05-23 NOTE — Progress Notes (Signed)
 Cardiology Clinic Note   Patient Name: Lisa Acosta Date of Encounter: 05/26/2024  Primary Care Provider:  Watt Harlene BROCKS, MD Primary Cardiologist:  Redell Shallow, MD  Patient Profile    Lisa Acosta 73 year old female presents to the clinic today for follow-up evaluation of her atrial fibrillation and essential hypertension.  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 Fib   Esophageal ulcer    Fatty liver    Medication Induced. 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   Pre-diabetes    Sarcoidosis    Past Surgical History:  Procedure Laterality Date   BREAST BIOPSY Left    needle core biopsy, benign   BRONCHIAL BIOPSY  06/15/2022   Procedure: BRONCHIAL BIOPSIES;  Surgeon: Meade Verdon RAMAN, MD;  Location: [facility] ENDOSCOPY;  Service: Pulmonary;;   BRONCHIAL BIOPSY  06/29/2022   Procedure: BRONCHIAL BIOPSIES;  Surgeon: Brenna Adine CROME, DO;  Location: [facility] ENDOSCOPY;  Service: Pulmonary;;   BRONCHIAL NEEDLE ASPIRATION BIOPSY  06/15/2022   Procedure: BRONCHIAL NEEDLE ASPIRATION BIOPSIES;  Surgeon: Meade Verdon RAMAN, MD;  Location: [facility] ENDOSCOPY;  Service: Pulmonary;;   BRONCHIAL NEEDLE ASPIRATION BIOPSY  06/29/2022   Procedure: BRONCHIAL NEEDLE ASPIRATION BIOPSIES;  Surgeon: Brenna Adine CROME, DO;  Location: [facility] ENDOSCOPY;  Service: Pulmonary;;   BRONCHIAL WASHINGS   06/15/2022   Procedure: BRONCHIAL WASHINGS;  Surgeon: Meade Verdon RAMAN, MD;  Location: [facility] ENDOSCOPY;  Service: Pulmonary;;   CATARACT EXTRACTION W/ INTRAOCULAR LENS IMPLANT     COLONOSCOPY     DILATION AND CURETTAGE OF UTERUS     Miscarriage   HAND SURGERY Left    after a fall   KNEE SURGERY Right    x2   LASIK     ROTATOR CUFF REPAIR Right    x2   TOTAL ABDOMINAL HYSTERECTOMY     TOTAL KNEE ARTHROPLASTY Right 01/15/2024   Procedure: ARTHROPLASTY, KNEE, TOTAL;  Surgeon: Vernetta Lonni GRADE, MD;  Location: [facility] OR;  Service: Orthopedics;  Laterality: Right;   VIDEO BRONCHOSCOPY N/A 06/15/2022   Procedure: VIDEO BRONCHOSCOPY WITH FLUORO;  Surgeon: Meade Verdon RAMAN, MD;  Location: [facility] ENDOSCOPY;  Service: Pulmonary;  Laterality: N/A;   VIDEO BRONCHOSCOPY WITH ENDOBRONCHIAL ULTRASOUND  06/15/2022   Procedure: VIDEO BRONCHOSCOPY WITH ENDOBRONCHIAL ULTRASOUND;  Surgeon: Meade Verdon RAMAN, MD;  Location: [facility] ENDOSCOPY;  Service: Pulmonary;;   VIDEO BRONCHOSCOPY WITH ENDOBRONCHIAL ULTRASOUND N/A 06/29/2022   Procedure: VIDEO BRONCHOSCOPY WITH ENDOBRONCHIAL ULTRASOUND;  Surgeon: Brenna Adine CROME, DO;  Location: [facility] ENDOSCOPY;  Service: Pulmonary;  Laterality: N/A;    Allergies  Allergies  Allergen Reactions   Dextran Anaphylaxis   Oxycontin  [Oxycodone ] Other (See Comments)    Hallucinations   Tree Extract    Penicillin G Rash  Penicillins Rash   Sulfa Antibiotics Rash and Other (See Comments)   Wound Dressing Adhesive Rash    History of Present Illness    Lisa Acosta has a PMH of HTN, PAF, OSA, hyperlipidemia, obesity, dyspnea, generalized anxiety disorder, depression, memory difficulty and vitamin D  insufficiency.  Her cardiac event monitor 3/17 showed sinus rhythm with paroxysmal atrial fibrillation.  She was treated with flecainide .  She underwent exercise treadmill stress testing 10/18 which showed no exercise-induced VT did not note any ST changes.  Her echocardiogram in 2019 at  [facility] showed normal LV function, severe left atrial enlargement, mild RV dysfunction, mild mitral regurgitation.  Her abdominal ultrasound 11/20 showed no aneurysm.  She was seen in follow-up by Dr. Pietro on 01/25/2021.  During that time she denied dyspnea, chest pain, palpitations and syncope.  She denied bleeding issues.  She was admitted to the hospital on 06/29/2022 for robotic assisted bronchoscopy.  She reported pneumonia 7/23 with cough and left-sided pain.  Her cough has been ongoing since that time.  Her chest CT showed persistent left upper lobe consolidation from July to September.  She continues to follow with pulmonology.  She presented to the clinic 07/24/22 for follow-up evaluation and stated she felt fairly well .  She had not been walking as much due to the cooler weather.  She did walk 20 to 30 minutes 3 times per week.  She was looking for an indoor track to continue walking over the winter.  We reviewed her most recent echocardiogram and CT results.  She expressed understanding.  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  Shewas currently on prednisone  which increased her appetite.  She was tolerating her cardiac medications well and denies side effects.  She continued to follow at [facility] as well.    She continue to follow-up with cardiology regularly.  She was seen in follow-up by Dr. Pietro on 05/18/2023.  During that time she noted that she was recovering from COVID.  She denied increased dyspnea, chest pain, palpitations, and syncope.  She did note some fatigue.  Her blood pressure was well-controlled.  She was compliant with Xarelto .  She presents to the clinic today for follow-up evaluation and states she notices occasional episodes of low blood pressure in the 90s and occasional episodes where her blood pressure is higher than normal.  She also reports episodes of palpitations that last for 30 minutes.  On exam today she reports that she is feeling  palpitations.  Her EKG shows sinus bradycardia 53 bpm first-degree AV block.  I will continue her current medication regimen.  I will have her increase her physical activity as tolerated.  She recently had right knee replacement and is looking forward to getting back into exercising.  She has completed her physical therapy.  She continues to stay with her daughter.  I will plan follow-up in 9 to 12 months.  Today she denies chest pain, shortness of breath, lower extremity edema, fatigue, palpitations, melena, hematuria, hemoptysis, diaphoresis, weakness, presyncope, syncope, orthopnea, and PND.    Home Medications    Prior to Admission medications   Medication Sig Start Date End Date Taking? Authorizing Provider  alendronate  (FOSAMAX ) 70 MG tablet TAKE 1 TABLET BY MOUTH EVERY 7 DAYS, TAKE WITH A FULL GLASS OF WATER ON AN EMPTY STOMACH Patient taking differently: Take 70 mg by mouth every Sunday. 05/17/22   Copland, Harlene BROCKS, MD  ALPRAZolam  (XANAX ) 0.5 MG tablet Take 0.5-1 tablets (0.25-0.5 mg  total) by mouth 2 (two) times daily as needed for anxiety. 07/06/22   Copland, Harlene BROCKS, MD  amLODipine  (NORVASC ) 5 MG tablet Take 1 tablet (5 mg total) by mouth daily. 12/29/21   Copland, Harlene BROCKS, MD  atenolol  (TENORMIN ) 50 MG tablet Take 1 tablet (50 mg total) by mouth daily. Patient taking differently: Take 50 mg by mouth at bedtime. 12/29/21   Copland, Harlene BROCKS, MD  benzonatate  (TESSALON ) 200 MG capsule Take 1 capsule (200 mg total) by mouth 3 (three) times daily as needed for cough. Patient not taking: Reported on 07/05/2022 06/22/22   Meade Verdon RAMAN, MD  Calcium  Carb-Cholecalciferol (CALCIUM  600 + D PO) Take 1 tablet by mouth in the morning and at bedtime.    [provider]  doxycycline  (VIBRA -TABS) 100 MG tablet Take 1 tablet (100 mg total) by mouth 2 (two) times daily. Patient not taking: Reported on 06/08/2022 05/19/22   Copland, Harlene BROCKS, MD  flecainide  (TAMBOCOR ) 100 MG tablet TAKE 1  TABLET BY MOUTH 2 TIMES DAILY 12/09/21   Copland, Harlene BROCKS, MD  FLUoxetine  (PROZAC ) 40 MG capsule Take 1 capsule (40 mg total) by mouth 2 (two) times daily. 12/29/21   Copland, Jessica C, MD  fluticasone  (FLONASE ) 50 MCG/ACT nasal spray SPRAY 2 SPRAYS INTO EACH NOSTRIL EVERY DAY 11/28/21   Saguier, [place], PA-C  HYDROcodone  bit-homatropine (HYCODAN) 5-1.5 MG/5ML syrup Take 5 mLs by mouth every 6 (six) hours as needed for cough. 06/20/22   Meade Verdon RAMAN, MD  hydrOXYzine  (ATARAX ) 10 MG tablet Take 1-2 tablets (10-20 mg total) by mouth 3 (three) times daily as needed for anxiety. 07/06/22   Copland, Jessica C, MD  Krill Oil 500 MG CAPS Take 500 mg by mouth daily.    [provider]  levothyroxine  (SYNTHROID ) 125 MCG tablet TAKE 1 TABLET BY MOUTH DAILY BEFORE BREAKFAST. 02/10/22   Copland, Jessica C, MD  magnesium oxide (MAG-OX) 400 (240 Mg) MG tablet Take 400 mg by mouth daily.    [provider]  Multiple Vitamins-Minerals (HAIR/SKIN/NAILS/BIOTIN PO) Take 1 capsule by mouth daily.    [provider]  Multiple Vitamins-Minerals (PRESERVISION AREDS 2+MULTI VIT PO) Take 1 capsule by mouth daily.    [provider]  omeprazole  (PRILOSEC) 40 MG capsule Take 40 mg by mouth in the morning and at bedtime.    [provider]  ondansetron  (ZOFRAN ) 4 MG tablet Take 4 mg by mouth 3 (three) times daily as needed for nausea or vomiting. 04/20/22   [provider]  predniSONE  (DELTASONE ) 10 MG tablet Take 3 tablets (30 mg total) by mouth daily with breakfast. 07/05/22 09/03/22  Icard, Adine CROME, DO  Propylene Glycol (SYSTANE BALANCE) 0.6 % SOLN Place 1 drop into both eyes as needed (dry eyes).    [provider]  rivaroxaban  (XARELTO ) 20 MG TABS tablet Take 1 tablet (20 mg total) by mouth daily with supper. 05/01/22   Copland, Harlene BROCKS, MD  rosuvastatin  (CRESTOR ) 20 MG tablet TAKE 1 TABLET BY MOUTH EVERY DAY 12/19/21   Copland, Harlene BROCKS, MD  sucralfate   (CARAFATE ) 1 g tablet Take 1 tablet (1 g total) by mouth 4 (four) times daily -  with meals and at bedtime. Patient not taking: Reported on 05/29/2022 04/25/22   Copland, Harlene BROCKS, MD  traZODone  (DESYREL ) 50 MG tablet Take 1.5 tablets (75 mg total) by mouth at bedtime. Patient taking differently: Take 50 mg by mouth at bedtime. 09/22/21   Copland, Harlene BROCKS, MD  Family History    Family History  Problem Relation Age of Onset   Atrial fibrillation Mother    Congestive Heart Failure Mother    Depression Mother    Heart disease Mother    Stroke Mother    Thyroid  disease Mother    Anxiety disorder Mother    Obesity Mother    Heart disease Father    Alcohol  abuse Father    High blood pressure Father    High Cholesterol Father    Alcoholism Father    Depression Sister    Atrial fibrillation Sister    Heart attack Sister    Heart disease Brother    Heart disease Brother    Depression Daughter    She indicated that her mother is deceased. She indicated that her father is deceased. She indicated that the status of her sister is unknown. She indicated that both of her brothers are alive. She indicated that the status of her daughter is unknown.  Social History    Social History   Socioeconomic History   Marital status: Married    Spouse name: John   Number of children: 1   Years of education: [employer] education level: Not on file  Occupational History   Occupation: Retired    Associate Professor: OTHER    Comment: [employer]  Tobacco Use   Smoking status: Never    Passive exposure: Current (doesn't smoke in house now or past 69yrs)   Smokeless tobacco: Never  Vaping Use   Vaping status: Never Used  Substance and Sexual Activity   Alcohol  use: Yes    Comment: rare (1-2x per year)   Drug use: No   Sexual activity: Yes  Other Topics Concern   Not on file  Social History Narrative   Patient lives at home spouse.   Caffeine use: 2 sodas weekly   Right handed     Social Drivers of Health   Financial Resource Strain: Low Risk  (10/27/2021)   Overall Financial Resource Strain (CARDIA)    Difficulty of Paying Living Expenses: Not hard at all  Food Insecurity: No Food Insecurity (01/15/2024)   Hunger Vital Sign    Worried About Running Out of Food in the Last Year: Never true    Ran Out of Food in the Last Year: Never true  Transportation Needs: No Transportation Needs (01/15/2024)   PRAPARE - Administrator, Civil Service (Medical): No    Lack of Transportation (Non-Medical): No  Physical Activity: Inactive (09/26/2018)   Exercise Vital Sign    Days of Exercise per Week: 0 days    Minutes of Exercise per Session: 0 min  Stress: No Stress Concern Present (10/27/2021)   [employer] of Occupational Health - Occupational Stress Questionnaire    Feeling of Stress : Only a little  Social Connections: Socially Integrated (01/15/2024)   Social Connection and Isolation Panel    Frequency of Communication with Friends and Family: Once a week    Frequency of Social Gatherings with Friends and Family: More than three times a week    Attends Religious Services: More than 4 times per year    Active Member of [employer] or Organizations: Yes    Attends Banker Meetings: More than 4 times per year    Marital Status: Married  Catering manager Violence: Not At Risk (01/15/2024)   Humiliation, Afraid, Rape, and Kick questionnaire    Fear of Current or Ex-Partner: No  Emotionally Abused: No    Physically Abused: No    Sexually Abused: No     Review of Systems    General:  No chills, fever, night sweats or weight changes.  Cardiovascular:  No chest pain, dyspnea on exertion, edema, orthopnea, palpitations, paroxysmal nocturnal dyspnea. Dermatological: No rash, lesions/masses Respiratory: No cough, dyspnea Urologic: No hematuria, dysuria Abdominal:   No nausea, vomiting, diarrhea, bright red blood per rectum, melena, or  hematemesis Neurologic:  No visual changes, wkns, changes in mental status. All other systems reviewed and are otherwise negative except as noted above.  Physical Exam    VS:  BP 108/60   Pulse (!) 55   Ht 5' 1 (1.549 m)   Wt 182 lb (82.6 kg)   SpO2 95%   BMI 34.39 kg/m  , BMI Body mass index is 34.39 kg/m. GEN: Well nourished, well developed, in no acute distress. HEENT: normal. Neck: Supple, no JVD, carotid bruits, or masses. Cardiac: RRR, no murmurs, rubs, or gallops. No clubbing, cyanosis, edema.  Radials/DP/PT 2+ and equal bilaterally.  Respiratory:  Respirations regular and unlabored, clear to auscultation bilaterally. GI: Soft, nontender, nondistended, BS + x 4. MS: no deformity or atrophy. Skin: warm and dry, no rash. Neuro:  Strength and sensation are intact. Psych: Normal affect.  Accessory Clinical Findings    Recent Labs: 01/07/2024: ALT 18 01/16/2024: BUN 15; Creatinine, Ser 0.62; Hemoglobin 11.8; Platelets 183; Potassium 4.3; Sodium 140   Recent Lipid Panel    Component Value Date/Time   CHOL 151 01/10/2023 1430   CHOL 186 01/05/2021 1040   TRIG 74.0 01/10/2023 1430   HDL 66.70 01/10/2023 1430   HDL 67 01/05/2021 1040   CHOLHDL 2 01/10/2023 1430   VLDL 14.8 01/10/2023 1430   LDLCALC 70 01/10/2023 1430   LDLCALC 95 01/05/2021 1040   LDLCALC 103 (H) 06/23/2020 1148   LDLDIRECT 112.0 01/22/2017 1559         ECG personally reviewed by me today-EKG Interpretation Date/Time:  Monday May 26 2024 11:18:38 EDT Ventricular Rate:  53 PR Interval:  262 QRS Duration:  100 QT Interval:  460 QTC Calculation: 431 R Axis:   42  Text Interpretation: Sinus bradycardia with 1st degree A-V block Inferior infarct , age undetermined Cannot rule out Anterior infarct , age undetermined When compared with ECG of 03-Nov-2023 03:42, PREVIOUS ECG IS PRESENT Confirmed by Emelia Hazy [phone]) on 05/26/2024 11:28:41 AM   Echocardiogram  07/14/2022  Conclusion(s)/Recommendation(s): Consider CMR if clinically indicated.   FINDINGS   Left Ventricle: Left ventricular ejection fraction, by estimation, is 65  to 70%. The left ventricle has normal function. The left ventricle  demonstrates regional wall motion abnormalities. The left ventricular  internal cavity size was normal in size.  There is no left ventricular hypertrophy. Left ventricular diastolic  parameters are consistent with Grade I diastolic dysfunction (impaired  relaxation).     LV Wall Scoring:  The basal anteroseptal segment and basal inferoseptal segment are  hypokinetic.   Right Ventricle: The right ventricular size is normal. No increase in  right ventricular wall thickness. Right ventricular systolic function is  normal.   Left Atrium: Left atrial size was normal in size.   Right Atrium: Right atrial size was normal in size.   Pericardium: Trivial pericardial effusion is present. The pericardial  effusion is circumferential. Presence of epicardial fat layer.   Mitral Valve: The mitral valve is grossly normal. No evidence of mitral  valve regurgitation. No evidence of  mitral valve stenosis.   Tricuspid Valve: The tricuspid valve is normal in structure. Tricuspid  valve regurgitation is not demonstrated. No evidence of tricuspid  stenosis.   Aortic Valve: The aortic valve was not well visualized. Aortic valve  regurgitation is not visualized. Aortic valve sclerosis is present, with  no evidence of aortic valve stenosis.   Pulmonic Valve: The pulmonic valve was grossly normal. Pulmonic valve  regurgitation is trivial. No evidence of pulmonic stenosis.   Aorta: The aortic root and ascending aorta are structurally normal, with  no evidence of dilitation.   Venous: The inferior vena cava is normal in size with greater than 50%  respiratory variability, suggesting right atrial pressure of 3 mmHg.   IAS/Shunts: No atrial level shunt detected by  color flow Doppler.    Assessment & Plan   1.  Paroxysmal atrial fibrillation-stable.  Note occasional palpitations.  Reports compliance with Xarelto  and continues to deny bleeding issues.  Reviewed importance of screening stool.   Continue Xarelto , atenolol , flecainide  Maintain physical activity Avoid triggers caffeine, chocolate, EtOH, dehydration etc.-reviewed  Essential hypertension-BP today 108/60 Continue medical therapy Heart healthy low-sodium diet-reviewed Request recent labs from PCP  Hyperlipidemia-LDL 70 on 01/10/23.  High-fiber diet Continue rosuvastatin  Follows with PCP  Hypothyroidism-taking Synthroid  appropriately. TSH followed by PCP. Continue levothyroxine   Disposition: Follow-up with Dr. Pietro or me in 9-12 year.   Josefa HERO. Eisley Barber NP-C     05/26/2024, 11:28 AM [facility] [address] Office [phone] Fax [phone]    I spent 14 minutes examining this patient, reviewing medications, and using patient centered shared decision making involving her cardiac care.  Prior to her visit I spent greater than 20 minutes reviewing her past medical history,  medications, and prior cardiac tests.

## 2024-05-26 ENCOUNTER — Encounter: Payer: Self-pay | Admitting: Family Medicine

## 2024-05-26 ENCOUNTER — Ambulatory Visit: Attending: General Practice | Admitting: General Practice

## 2024-05-26 ENCOUNTER — Encounter: Payer: Self-pay | Admitting: General Practice

## 2024-05-26 ENCOUNTER — Other Ambulatory Visit: Payer: Self-pay

## 2024-05-26 VITALS — BP 108/60 | HR 55 | Ht 61.0 in | Wt 182.0 lb

## 2024-05-26 DIAGNOSIS — E7800 Pure hypercholesterolemia, unspecified: Secondary | ICD-10-CM | POA: Insufficient documentation

## 2024-05-26 DIAGNOSIS — E038 Other specified hypothyroidism: Secondary | ICD-10-CM | POA: Insufficient documentation

## 2024-05-26 DIAGNOSIS — K529 Noninfective gastroenteritis and colitis, unspecified: Secondary | ICD-10-CM

## 2024-05-26 DIAGNOSIS — I480 Paroxysmal atrial fibrillation: Secondary | ICD-10-CM | POA: Diagnosis not present

## 2024-05-26 DIAGNOSIS — I10 Essential (primary) hypertension: Secondary | ICD-10-CM | POA: Diagnosis not present

## 2024-05-26 MED ORDER — RIVAROXABAN 20 MG PO TABS: 20.0000 mg | ORAL_TABLET | Freq: Every day | ORAL | 1 refills | Status: AC

## 2024-05-26 MED ORDER — FLECAINIDE ACETATE 100 MG PO TABS
100.0000 mg | ORAL_TABLET | Freq: Two times a day (BID) | ORAL | 3 refills | Status: AC
Start: 2024-05-26 — End: ?

## 2024-05-26 NOTE — Patient Instructions (Signed)
 Medication Instructions:  Your physician recommends that you continue on your current medications as directed. Please refer to the Current Medication list given to you today.  *If you need a refill on your cardiac medications before your next appointment, please call your pharmacy*  Lab Work: NONE If you have labs (blood work) drawn today and your tests are completely normal, you will receive your results only by: MyChart Message (if you have MyChart) OR A paper copy in the mail If you have any lab test that is abnormal or we need to change your treatment, we will call you to review the results.  Testing/Procedures: NONE  Follow-Up: At [facility], you and your health needs are our priority.  As part of our continuing mission to provide you with exceptional heart care, our providers are all part of one team.  This team includes your primary Cardiologist (physician) and Advanced Practice Providers or APPs (Physician Assistants and Nurse Practitioners) who all work together to provide you with the care you need, when you need it.  Your next appointment:   9-12 month(s)  Provider:   Redell Shallow, MD or Josefa Beauvais, NP  We recommend signing up for the patient portal called MyChart.  Sign up information is provided on this After Visit Summary.  MyChart is used to connect with patients for Virtual Visits (Telemedicine).  Patients are able to view lab/test results, encounter notes, upcoming appointments, etc.  Non-urgent messages can be sent to your provider as well.   To learn more about what you can do with MyChart, go to [email or web address].   Other Instructions Exercise regularly as told by your doctor. Make sure to weight daily and keep a weight log.   Moderate-intensity exercise is any activity that gets you moving enough to burn at least three times more energy (calories) than if you were sitting. Examples of moderate exercise include: Walking a mile in 15  minutes. Doing light yard work. Biking at an easy pace. Most people should get at least 150 minutes of moderate-intensity exercise a week to maintain their body weight.  Increase your water intake: Maintain hydration

## 2024-05-26 NOTE — Telephone Encounter (Signed)
 Prescription refill request for Xarelto  received.  Indication: a fib Last office visit: 05/26/24 Weight: 182 Age: 73  Scr: 0.62 epic 01/16/24 CrCl:  104ml/min

## 2024-05-27 ENCOUNTER — Ambulatory Visit: Admitting: Nurse Practitioner

## 2024-05-27 NOTE — Patient Instructions (Signed)
 It was good to see you again today Please let me know if your symptoms are not improving or if they are getting worse We will get you with a gastroenterologist to soon as possible Shots recommended this fall-flu, COVID booster, 1 lifetime dose of RSV, shingles vaccine series once (2 doses)

## 2024-05-27 NOTE — Progress Notes (Addendum)
 [facility] [address], [address] [place], [place] [address] [phone]  Date:  05/29/2024   Name:  Lisa Acosta   DOB:  1950/12/12   MRN:  [id number]  PCP:  Watt Harlene BROCKS, MD    Chief Complaint: No chief complaint on file.   History of Present Illness:  Lisa Acosta is a 73 y.o. 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  Paroxysmal atrial fibrillation-stable.  Note occasional palpitations.  Reports compliance with Xarelto  and continues to deny bleeding issues.  Reviewed importance of screening stool.   Continue Xarelto , atenolol , flecainide  Maintain physical activity Avoid triggers caffeine, chocolate, EtOH, dehydration etc.-reviewed   Essential hypertension-BP today 108/60 Continue medical therapy Heart healthy low-sodium diet-reviewed Request recent labs from PCP   Hyperlipidemia-LDL 70 on 01/10/23.  High-fiber diet Continue rosuvastatin  Follows with PCP  - Flu shot- give today  - Recommend COVID booster this fall - Recommend Shingrix - Recommend RSV  It looks like her last screening colonoscopy was approximately 10 years ago- per care everywhere done 10/30/14 at [facility] but I don't have any other details   Discussed the use of AI scribe software for clinical note transcription with the patient, who gave verbal consent to proceed.  Discussed the use of AI scribe software for clinical note transcription with the  patient, who gave verbal consent to proceed.  History of Present Illness Lisa Acosta is a 73 year old female who presents with a month-long history of diarrhea.  She has been experiencing diarrhea for approximately one month, with episodes occurring without warning, causing significant anxiety about reaching a bathroom in time. The diarrhea typically occurs once a day but is severe enough to require four doses of Imodium to stop. She is unsure of the trigger but recalls an episode occurring ten minutes after consuming a peach smoothie. No vomiting, fever, blood, or mucus in the stools. Her last colonoscopy was about ten years ago, and she has a history of diverticulosis and polyps.  She has a history of sarcoidosis and mentions that some symptoms have returned. She completed methotrexate  treatment while hospitalized and is scheduled for a CT scan with her pulmonary doctor next week.  She reports a weight gain of 15 to 20 pounds over the last six months, attributing it to uncontrolled eating and lack of exercise following knee surgery. She is concerned about her eating habits and mentions difficulty in maintaining a healthy diet post-surgery.  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 She has not seen a neurologist in many years and is considering a referral for further evaluation.  She mentions a past urinary tract infection in 2024 that was resistant to antibiotics but does not believe it is related to her current symptoms.  She has completed physical therapy for her knee but reports persistent pain and sensitivity in the area.     Patient Active Problem List  Diagnosis Date Noted   Status post total right knee replacement 01/15/2024   Unilateral primary osteoarthritis, right knee 10/15/2023   Sarcoidosis 03/08/2023   High risk medication use 03/08/2023   Nocturnal  hypoxemia 03/08/2023   Muscle pain 01/09/2023   Generalized obesity 10/12/2022   Class 2 severe obesity with serious comorbidity and body mass index (BMI) of 35.0 to 35.9 in adult [facility]) 08/15/2022   Lung infiltrate    Persistent pneumonia    Hilar adenopathy    Vitamin D  insufficiency 01/10/2021   Cognitive complaints with normal neuropsychological exam 07/29/2020   Memory difficulty 08/18/2019   Right wrist pain 05/04/2019   Mild episode of recurrent major depressive disorder (HCC) 12/11/2018   Depression, major, single episode, mild (HCC) 10/22/2018   Gait abnormality 10/14/2018   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   Moderate episode of recurrent major depressive disorder (HCC) 09/26/2018   Generalized anxiety disorder 09/26/2018   Osteoporosis 01/25/2018   Fatty liver 08/22/2017   Gallbladder sludge 08/22/2017   Upper abdominal pain 07/19/2017   Gastroesophageal reflux disease with esophagitis 06/01/2017   Encounter for monitoring flecainide  therapy 04/12/2017   Osteoarthritis of hands, 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   Dyspnea 09/22/2016   OSA (obstructive sleep apnea) 08/31/2016   PAF (paroxysmal atrial fibrillation) (HCC) 03/16/2016   Hypothyroid 03/16/2016   Obesity (BMI 30-39.9) 03/16/2016   Skin lesion of left upper extremity 02/05/2016   Essential hypertension 11/10/2015   Hyperlipidemia 11/10/2015   Acute gastritis 06/05/2014   Allergic rhinitis 06/05/2014   Arthritis 06/05/2014   Diaphragmatic hernia 06/05/2014   Difficulty swallowing 06/05/2014   Diverticulosis of colon 06/05/2014   Hyperglycemia 06/05/2014   Impaired fasting glucose 06/05/2014   Insomnia 06/05/2014   Lower back pain 06/05/2014   Osteoarthritis of shoulder 06/05/2014   Pain in joint, pelvic region and thigh 06/05/2014   Pain of right thumb 06/05/2014   Esophageal reflux 03/18/2014   Esophageal ulcer 03/18/2014   Anxiety state 11/25/2013    Past Medical History:  Diagnosis Date   Anxiety     Arthritis    Atrial fibrillation (HCC)    Back pain    Complication of anesthesia    Constipation    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 Fib   Esophageal ulcer    Fatty liver    Medication Induced. 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   Pre-diabetes    Sarcoidosis     Past Surgical History:  Procedure Laterality Date   BREAST BIOPSY Left    needle core biopsy, benign   BRONCHIAL BIOPSY  06/15/2022   Procedure: BRONCHIAL BIOPSIES;  Surgeon: Meade Verdon RAMAN, MD;  Location: [facility] ENDOSCOPY;  Service: Pulmonary;;   BRONCHIAL BIOPSY  06/29/2022   Procedure: BRONCHIAL BIOPSIES;  Surgeon: Brenna Adine CROME, DO;  Location: [facility] ENDOSCOPY;  Service: Pulmonary;;   BRONCHIAL NEEDLE ASPIRATION BIOPSY  06/15/2022   Procedure: BRONCHIAL NEEDLE ASPIRATION BIOPSIES;  Surgeon: Meade Verdon RAMAN, MD;  Location[facility] ENDOSCOPY;  Service: Pulmonary;;  BRONCHIAL NEEDLE ASPIRATION BIOPSY  06/29/2022   Procedure: BRONCHIAL NEEDLE ASPIRATION BIOPSIES;  Surgeon: Brenna Adine CROME, DO;  Location: [facility] ENDOSCOPY;  Service: Pulmonary;;   BRONCHIAL WASHINGS  06/15/2022   Procedure: BRONCHIAL WASHINGS;  Surgeon: Meade Verdon RAMAN, MD;  Location: [facility] ENDOSCOPY;  Service: Pulmonary;;   CATARACT EXTRACTION W/ INTRAOCULAR LENS IMPLANT     COLONOSCOPY     DILATION AND CURETTAGE OF UTERUS     Miscarriage   HAND SURGERY Left    after a fall   KNEE SURGERY Right    x2   LASIK     ROTATOR CUFF REPAIR Right    x2   TOTAL ABDOMINAL HYSTERECTOMY     TOTAL KNEE ARTHROPLASTY Right 01/15/2024   Procedure:  ARTHROPLASTY, KNEE, TOTAL;  Surgeon: Vernetta Lonni GRADE, MD;  Location: [facility] OR;  Service: Orthopedics;  Laterality: Right;   VIDEO BRONCHOSCOPY N/A 06/15/2022   Procedure: VIDEO BRONCHOSCOPY WITH FLUORO;  Surgeon: Meade Verdon RAMAN, MD;  Location: [facility] ENDOSCOPY;  Service: Pulmonary;  Laterality: N/A;   VIDEO BRONCHOSCOPY WITH ENDOBRONCHIAL ULTRASOUND  06/15/2022   Procedure: VIDEO BRONCHOSCOPY WITH ENDOBRONCHIAL ULTRASOUND;  Surgeon: Meade Verdon RAMAN, MD;  Location: [facility] ENDOSCOPY;  Service: Pulmonary;;   VIDEO BRONCHOSCOPY WITH ENDOBRONCHIAL ULTRASOUND N/A 06/29/2022   Procedure: VIDEO BRONCHOSCOPY WITH ENDOBRONCHIAL ULTRASOUND;  Surgeon: Brenna Adine CROME, DO;  Location: [facility] ENDOSCOPY;  Service: Pulmonary;  Laterality: N/A;    Social History   Tobacco Use   Smoking status: Never    Passive exposure: Current (doesn't smoke in house now or past 74yrs)   Smokeless tobacco: Never  Vaping Use   Vaping status: Never Used  Substance Use Topics   Alcohol  use: Yes    Comment: rare (1-2x per year)   Drug use: No    Family History  Problem Relation Age of Onset   Atrial fibrillation Mother    Congestive Heart Failure Mother    Depression Mother    Heart disease Mother    Stroke Mother    Thyroid  disease Mother    Anxiety disorder Mother    Obesity Mother    Heart disease Father    Alcohol  abuse Father    High blood pressure Father    High Cholesterol Father    Alcoholism Father    Depression Sister    Atrial fibrillation Sister    Heart attack Sister    Heart disease Brother    Heart disease Brother    Depression Daughter     Allergies  Allergen Reactions   Dextran Anaphylaxis   Oxycontin  [Oxycodone ] Other (See Comments)    Hallucinations   Tree Extract    Penicillin G Rash   Penicillins Rash   Sulfa Antibiotics Rash and Other (See Comments)   Wound Dressing Adhesive Rash    Medication list has been reviewed and updated.  Current Outpatient Medications on File Prior to  Visit  Medication Sig Dispense Refill   alendronate  (FOSAMAX ) 70 MG tablet Take 1 tablet (70 mg total) by mouth every 7 (seven) days. Take with a full glass of water on an empty stomach. 12 tablet 3   amLODipine  (NORVASC ) 5 MG tablet TAKE 1 TABLET (5 MG TOTAL) BY MOUTH DAILY. 90 tablet 1   atenolol  (TENORMIN ) 50 MG tablet TAKE 1 TABLET BY MOUTH EVERY DAY 90 tablet 1   benzonatate  (TESSALON ) 200 MG capsule Take 1 capsule (200 mg total) by mouth 3 (three) times daily as needed for cough. 60 capsule 2   Calcium   Carb-Cholecalciferol (CALCIUM  600 + D PO) Take 1 tablet by mouth in the morning and at bedtime.     cetirizine  (ZYRTEC ) 10 MG tablet Take 1 tablet (10 mg total) by mouth daily. 30 tablet 11   flecainide  (TAMBOCOR ) 100 MG tablet Take 1 tablet (100 mg total) by mouth 2 (two) times daily. 180 tablet 3   fluticasone  (FLONASE ) 50 MCG/ACT nasal spray Place 2 sprays into both nostrils 2 (two) times daily. 16 g 6   guaiFENesin  (MUCINEX ) 600 MG 12 hr tablet Take 2 tablets (1,200 mg total) by mouth 2 (two) times daily. (Patient taking differently: Take 1,200 mg by mouth daily.) 30 tablet 0   HYDROcodone -acetaminophen  (NORCO/VICODIN) 5-325 MG tablet Take 1 tablet by mouth every 6 (six) hours as needed for moderate pain (pain score 4-6). 30 tablet 0   levothyroxine  (SYNTHROID ) 125 MCG tablet Take 1 tablet (125 mcg total) by mouth daily before breakfast. 90 tablet 1   methocarbamol  (ROBAXIN ) 500 MG tablet Take 1 tablet (500 mg total) by mouth every 6 (six) hours as needed. 40 tablet 1   Multiple Vitamins-Minerals (PRESERVISION AREDS 2+MULTI VIT PO) Take 1 capsule by mouth daily.     nystatin -triamcinolone  (MYCOLOG II) cream Apply 1 Application topically 2 (two) times daily. 30 g 0   omeprazole  (PRILOSEC) 40 MG capsule Take 1 capsule (40 mg total) by mouth in the morning and at bedtime. (Patient taking differently: Take 40 mg by mouth daily.) 180 capsule 1   ondansetron  (ZOFRAN ) 8 MG tablet Take 1 tablet (8  mg total) by mouth every 8 (eight) hours as needed for nausea or vomiting. 30 tablet 2   Propylene Glycol (SYSTANE BALANCE) 0.6 % SOLN Place 1 drop into both eyes as needed (dry eyes).     rivaroxaban  (XARELTO ) 20 MG TABS tablet Take 1 tablet (20 mg total) by mouth daily with supper. 90 tablet 1   rosuvastatin  (CRESTOR ) 20 MG tablet Take 1 tablet (20 mg total) by mouth daily. 90 tablet 1   sertraline  (ZOLOFT ) 100 MG tablet Take 1 tablet (100 mg total) by mouth daily. 90 tablet 3   traZODone  (DESYREL ) 50 MG tablet Take 25 mg in the afternoon as needed for panic.  Take 50 mg at bedtime as needed for insomnia (Patient taking differently: Take 50 mg by mouth.   Take 50 mg at bedtime as needed for insomnia) 135 tablet 0   No current facility-administered medications on file prior to visit.    Review of Systems:  As per HPI- otherwise negative.   Physical Examination: Vitals:   05/29/24 1507  BP: 116/65  Pulse: (!) 58   Vitals:   05/29/24 1507  Weight: 183 lb (83 kg)  Height: 5' 1 (1.549 m)   Body mass index is 34.58 kg/m. Ideal Body Weight: Weight in (lb) to have BMI = 25: 132  GEN: no acute distress. Obese, looks well  HEENT: Atraumatic, Normocephalic.  Ears and Nose: No external deformity. CV: RRR, No M/G/R. No JVD. No thrill. No extra heart sounds. PULM: CTA B, no wheezes, crackles, rhonchi. No retractions. No resp. distress. No accessory muscle use. ABD: S, NT, ND, +BS. No rebound. No HSM. Belly is benign  EXTR: No c/c/e PSYCH: Normally interactive. Conversant.    Assessment and Plan: Chronic diarrhea - Plan: Calprotectin, Fecal, Stool Culture, Clostridium Difficile by PCR, CBC, Comprehensive metabolic panel with GFR  Hypercholesterolemia - Plan: Lipid panel  Sarcoidosis  Hypothyroidism due to acquired atrophy of thyroid  - Plan: TSH  Prediabetes - Plan: Comprehensive metabolic panel with GFR, Hemoglobin A1c, Amb ref to Medical Nutrition Therapy-MNT  Memory loss -  Plan: Ambulatory referral to Neuropsychology  Morbid obesity (HCC) - Plan: Amb ref to Medical Nutrition Therapy-MNT  Need for influenza vaccination - Plan: Flu vaccine HIGH DOSE PF(Fluzone Trivalent)  Assessment & Plan Chronic diarrhea Diarrhea for one month, controlled with Imodium. Differential includes C. diff, bacterial infections, and inflammatory conditions. - Provide stool collection kit for C. diff, abnormal bacteria, and calprotectin testing. - Order blood work for liver, kidney function, and thyroid  levels. - Referral to GI is pending  Obesity Weight gain due to lack of exercise and uncontrolled eating. Possible high blood sugar levels noted. - Refer to dietitian for nutritional counseling and weight management.  Depression Symptoms may contribute to cognitive impairment. Counseling sought with provider recommended by pastor. - Encourage follow-up with recommended counselor.  Cognitive impairment Worsening short-term memory, previously linked to depression. No recent neurology evaluation. - Refer for neuropsychological testing.  General Health Maintenance Due for flu and COVID vaccinations. Increased risk due to age and comorbidities. - Administer flu shot today. - Advise COVID vaccination at pharmacy downstairs.  Signed Harlene Schroeder, MD  Addendum 9/19, received labs.  Message to patient  Results for orders placed or performed in visit on 05/29/24  CBC   Collection Time: 05/29/24  3:36 PM  Result Value Ref Range   WBC 5.5 4.0 - 10.5 K/uL   RBC 4.65 3.87 - 5.11 Mil/uL   Platelets 209.0 150.0 - 400.0 K/uL   Hemoglobin 13.2 12.0 - 15.0 g/dL   HCT 60.3 63.9 - 53.9 %   MCV 85.2 78.0 - 100.0 fl   MCHC 33.2 30.0 - 36.0 g/dL   RDW 86.5 88.4 - 84.4 %  Comprehensive metabolic panel with GFR   Collection Time: 05/29/24  3:36 PM  Result Value Ref Range   Sodium 139 135 - 145 mEq/L   Potassium 4.1 3.5 - 5.1 mEq/L   Chloride 105 96 - 112 mEq/L   CO2 28 19 - 32  mEq/L   Glucose, Bld 93 70 - 99 mg/dL   BUN 26 (H) 6 - 23 mg/dL   Creatinine, Ser 9.32 0.40 - 1.20 mg/dL   Total Bilirubin 0.7 0.2 - 1.2 mg/dL   Alkaline Phosphatase 85 39 - 117 U/L   AST 17 0 - 37 U/L   ALT 12 0 - 35 U/L   Total Protein 7.0 6.0 - 8.3 g/dL   Albumin  4.1 3.5 - 5.2 g/dL   GFR 13.70 >39.99 mL/min   Calcium  9.3 8.4 - 10.5 mg/dL  Hemoglobin J8r   Collection Time: 05/29/24  3:36 PM  Result Value Ref Range   Hgb A1c MFr Bld 6.3 4.6 - 6.5 %  Lipid panel   Collection Time: 05/29/24  3:36 PM  Result Value Ref Range   Cholesterol 153 0 - 200 mg/dL   Triglycerides 850.9 0.0 - 149.0 mg/dL   HDL 49.49 >60.99 mg/dL   VLDL 70.1 0.0 - 59.9 mg/dL   LDL Cholesterol 73 0 - 99 mg/dL   Total CHOL/HDL Ratio 3    NonHDL 102.94   TSH   Collection Time: 05/29/24  3:36 PM  Result Value Ref Range   TSH 0.80 0.35 - 5.50 uIU/mL

## 2024-05-27 NOTE — Addendum Note (Signed)
 Addended by: WATT RAISIN C on: 05/27/2024 06:26 AM   Modules accepted: Orders

## 2024-05-29 ENCOUNTER — Encounter: Payer: Self-pay | Admitting: Family Medicine

## 2024-05-29 ENCOUNTER — Ambulatory Visit (INDEPENDENT_AMBULATORY_CARE_PROVIDER_SITE_OTHER): Admitting: Family Medicine

## 2024-05-29 ENCOUNTER — Ambulatory Visit: Admitting: Nurse Practitioner

## 2024-05-29 VITALS — BP 116/65 | HR 58 | Ht 61.0 in | Wt 183.0 lb

## 2024-05-29 DIAGNOSIS — R413 Other amnesia: Secondary | ICD-10-CM

## 2024-05-29 DIAGNOSIS — D869 Sarcoidosis, unspecified: Secondary | ICD-10-CM

## 2024-05-29 DIAGNOSIS — K529 Noninfective gastroenteritis and colitis, unspecified: Secondary | ICD-10-CM | POA: Diagnosis not present

## 2024-05-29 DIAGNOSIS — Z23 Encounter for immunization: Secondary | ICD-10-CM

## 2024-05-29 DIAGNOSIS — R7303 Prediabetes: Secondary | ICD-10-CM | POA: Diagnosis not present

## 2024-05-29 DIAGNOSIS — E034 Atrophy of thyroid (acquired): Secondary | ICD-10-CM

## 2024-05-29 DIAGNOSIS — E7800 Pure hypercholesterolemia, unspecified: Secondary | ICD-10-CM

## 2024-05-30 ENCOUNTER — Encounter: Payer: Self-pay | Admitting: Family Medicine

## 2024-05-30 LAB — COMPREHENSIVE METABOLIC PANEL WITH GFR
ALT: 12 U/L (ref 0–35)
AST: 17 U/L (ref 0–37)
Albumin: 4.1 g/dL (ref 3.5–5.2)
Alkaline Phosphatase: 85 U/L (ref 39–117)
BUN: 26 mg/dL — ABNORMAL HIGH (ref 6–23)
CO2: 28 meq/L (ref 19–32)
Calcium: 9.3 mg/dL (ref 8.4–10.5)
Chloride: 105 meq/L (ref 96–112)
Creatinine, Ser: 0.67 mg/dL (ref 0.40–1.20)
GFR: 86.29 mL/min (ref >60.00)
Glucose, Bld: 93 mg/dL (ref 70–99)
Potassium: 4.1 meq/L (ref 3.5–5.1)
Sodium: 139 meq/L (ref 135–145)
Total Bilirubin: 0.7 mg/dL (ref 0.2–1.2)
Total Protein: 7 g/dL (ref 6.0–8.3)

## 2024-05-30 LAB — LIPID PANEL
Cholesterol: 153 mg/dL (ref 0–200)
HDL: 50.5 mg/dL (ref >39.00)
LDL Cholesterol: 73 mg/dL (ref 0–99)
NonHDL: 102.94
Total CHOL/HDL Ratio: 3
Triglycerides: 149 mg/dL (ref 0.0–149.0)
VLDL: 29.8 mg/dL (ref 0.0–40.0)

## 2024-05-30 LAB — CBC
HCT: 39.6 % (ref 36.0–46.0)
Hemoglobin: 13.2 g/dL (ref 12.0–15.0)
MCHC: 33.2 g/dL (ref 30.0–36.0)
MCV: 85.2 fl (ref 78.0–100.0)
Platelets: 209 K/uL (ref 150.0–400.0)
RBC: 4.65 Mil/uL (ref 3.87–5.11)
RDW: 13.4 % (ref 11.5–15.5)
WBC: 5.5 K/uL (ref 4.0–10.5)

## 2024-05-30 LAB — TSH: TSH: 0.8 u[IU]/mL (ref 0.35–5.50)

## 2024-05-30 LAB — HEMOGLOBIN A1C: Hgb A1c MFr Bld: 6.3 % (ref 4.6–6.5)

## 2024-06-02 ENCOUNTER — Telehealth: Payer: Self-pay

## 2024-06-02 ENCOUNTER — Other Ambulatory Visit

## 2024-06-02 DIAGNOSIS — K529 Noninfective gastroenteritis and colitis, unspecified: Secondary | ICD-10-CM | POA: Diagnosis not present

## 2024-06-02 NOTE — Telephone Encounter (Signed)
 Copied from CRM #[id number]. Topic: General - Other >> Jun 02, 2024 10:07 AM Treva T wrote: Reason for CRM: Received call from patient, requesting to speak directly to lab at office, in regards to special care instructions on a specimen she needs to bring into office.   Specimen is for a stool culture collection. Patient has already collected specimen needs instructions for transporting specimen to office.   Called and spoke to office, Jamee, states lab is busy and unable to answer call at time of call. Advised to send a clinical CRM to office.  Patient advised, will send a CRM for a return call.  Verbalized understanding, requesting a return call at [phone].  Patient is aware of same day call back.

## 2024-06-02 NOTE — Telephone Encounter (Signed)
 Pt notified of instructions on bringing back specimens.

## 2024-06-03 ENCOUNTER — Encounter: Payer: Self-pay | Admitting: Nurse Practitioner

## 2024-06-03 ENCOUNTER — Ambulatory Visit (INDEPENDENT_AMBULATORY_CARE_PROVIDER_SITE_OTHER): Admitting: Nurse Practitioner

## 2024-06-03 VITALS — BP 119/75 | HR 68 | Temp 98.1°F | Ht 61.0 in | Wt 177.0 lb

## 2024-06-03 DIAGNOSIS — Z6833 Body mass index (BMI) 33.0-33.9, adult: Secondary | ICD-10-CM

## 2024-06-03 DIAGNOSIS — E66811 Obesity, class 1: Secondary | ICD-10-CM | POA: Diagnosis not present

## 2024-06-03 DIAGNOSIS — R7303 Prediabetes: Secondary | ICD-10-CM

## 2024-06-03 NOTE — Progress Notes (Signed)
 Office: [phone]  /  Fax: [phone]  WEIGHT SUMMARY AND BIOMETRICS  Weight Lost Since Last Visit: 0lb  Weight Gained Since Last Visit: 2lb   Vitals Temp: 98.1 F (36.7 C) BP: 119/75 Pulse Rate: 68 SpO2: 100 %   Anthropometric Measurements Height: 5' 1 (1.549 m) Weight: 177 lb (80.3 kg) BMI (Calculated): 33.46 Weight at Last Visit: 175lb Weight Lost Since Last Visit: 0lb Weight Gained Since Last Visit: 2lb Starting Weight: 168lb Total Weight Loss (lbs): 0 lb (0 kg)   Body Composition  Body Fat %: 43.5 % Fat Mass (lbs): 77 lbs Muscle Mass (lbs): 95.2 lbs Total Body Water (lbs): 64.6 lbs Visceral Fat Rating : 14   Other Clinical Data Fasting: No Labs: No Today's Visit #: 27 Starting Date: 01/05/21     HPI  Chief Complaint: OBESITY  Lisa Acosta is here to discuss her progress with her obesity treatment plan. She is on the practicing portion control and making smarter food choices, such as increasing vegetables and decreasing simple carbohydrates and states she is following her eating plan approximately 0 % of the time. She states she is exercising 0 minutes 0 days per week (Knee replacement surgery 01/2024)   Interval History:  Since last office visit on 12/31/23 she has gained 2 pounds.  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 She had some problems afterwards and was in rehab and had PT for 2-3 months. Her goal is to start going back to the gym.  She is struggling with diarrhea and is worried about going back to the gym at this time. She recently saw her PCP and was referred to RD but is unable to go due to cost.  She states that she doesn't know what to eat.  Her husband does all the cooking.  She notes that he is not a good cook.  She would like to cook but struggles with lifting the pans.  He doesn't want her to cook because he likes to be in control.  His dementia is getting worse.  They are sharing a car and so therefore she is limited on where she can  go.    Pharmacotherapy for weight loss: She is not currently taking medications  for medical weight loss.     Prediabetes Last A1c was 6.3-worsening  Medication(s): None Polyphagia:Yes Lab Results  Component Value Date   HGBA1C 6.3 05/29/2024   HGBA1C 5.7 01/10/2023   HGBA1C 5.6 11/16/2021   HGBA1C 5.7 06/30/2021   HGBA1C 6.2 (H) 01/05/2021   Lab Results  Component Value Date   INSULIN  20.4 01/05/2021      PHYSICAL EXAM:  Blood pressure 119/75, pulse 68, temperature 98.1 F (36.7 C), height 5' 1 (1.549 m), weight 177 lb (80.3 kg), SpO2 100%. Body mass index is 33.44 kg/m.  General: She is overweight, cooperative, alert, well developed, and in no acute distress. PSYCH: Has normal mood, affect and thought process.   Extremities: No edema.  Neurologic: No gross sensory or motor deficits. No tremors or fasciculations noted.    DIAGNOSTIC DATA REVIEWED:  BMET    Component Value Date/Time   NA 139 05/29/2024 1536   NA 143 09/03/2023 1525   K 4.1 05/29/2024 1536   CL 105 05/29/2024 1536   CO2 28 05/29/2024 1536   GLUCOSE 93 05/29/2024 1536   BUN 26 (H) 05/29/2024 1536   BUN 21 09/03/2023 1525   CREATININE 0.67 05/29/2024 1536   CALCIUM  9.3 05/29/2024 1536   GFRNONAA >60 01/16/2024  9479   GFRAA >60 09/24/2019 1224   Lab Results  Component Value Date   HGBA1C 6.3 05/29/2024   HGBA1C 6.0 01/22/2017   Lab Results  Component Value Date   INSULIN  20.4 01/05/2021   Lab Results  Component Value Date   TSH 0.80 05/29/2024   CBC    Component Value Date/Time   WBC 5.5 05/29/2024 1536   RBC 4.65 05/29/2024 1536   HGB 13.2 05/29/2024 1536   HGB 12.9 09/03/2023 1525   HCT 39.6 05/29/2024 1536   HCT 39.9 09/03/2023 1525   PLT 209.0 05/29/2024 1536   PLT 253 09/03/2023 1525   MCV 85.2 05/29/2024 1536   MCV 91 09/03/2023 1525   MCH 30.3 01/16/2024 0520   MCHC 33.2 05/29/2024 1536   RDW 13.4 05/29/2024 1536   RDW 12.4 09/03/2023 1525   Iron Studies No  results found for: IRON, TIBC, FERRITIN, IRONPCTSAT Lipid Panel     Component Value Date/Time   CHOL 153 05/29/2024 1536   CHOL 186 01/05/2021 1040   TRIG 149.0 05/29/2024 1536   HDL 50.50 05/29/2024 1536   HDL 67 01/05/2021 1040   CHOLHDL 3 05/29/2024 1536   VLDL 29.8 05/29/2024 1536   LDLCALC 73 05/29/2024 1536   LDLCALC 95 01/05/2021 1040   LDLCALC 103 (H) 06/23/2020 1148   LDLDIRECT 112.0 01/22/2017 1559   Hepatic Function Panel     Component Value Date/Time   PROT 7.0 05/29/2024 1536   PROT 7.0 09/03/2023 1525   ALBUMIN  4.1 05/29/2024 1536   ALBUMIN  4.2 09/03/2023 1525   AST 17 05/29/2024 1536   ALT 12 05/29/2024 1536   ALKPHOS 85 05/29/2024 1536   BILITOT 0.7 05/29/2024 1536   BILITOT 0.8 09/03/2023 1525   BILIDIR 0.1 06/23/2020 1148   BILIDIR 0.24 04/30/2020 1048   IBILI 0.7 06/23/2020 1148      Component Value Date/Time   TSH 0.80 05/29/2024 1536   Nutritional Lab Results  Component Value Date   VD25OH 79.10 11/16/2021   VD25OH 25.0 (L) 01/05/2021     ASSESSMENT AND PLAN  TREATMENT PLAN FOR OBESITY:  Recommended Dietary Goals  Lisa Acosta is currently in the action stage of change. As such, her goal is to continue weight management plan. She has agreed to practicing portion control and making smarter food choices, such as increasing vegetables and decreasing simple carbohydrates.  Behavioral Intervention  We discussed the following Behavioral Modification Strategies today: increasing lean protein intake to established goals, decreasing simple carbohydrates , increasing vegetables, increasing fiber rich foods, increasing water intake , work on meal planning and preparation, reading food labels , keeping healthy foods at home, continue to work on maintaining a reduced calorie state, getting the recommended amount of protein, incorporating whole foods, making healthy choices, staying well hydrated and practicing mindfulness when eating., and increase  protein intake, fibrous foods (25 grams per day for women, 30 grams for men) and water to improve satiety and decrease hunger signals. .  Additional resources provided today: NA  Recommended Physical Activity Goals  Lisa Acosta is limited on exercising. She walks with the aid of a walker.    ASSOCIATED CONDITIONS ADDRESSED TODAY  Action/Plan  Prediabetes Erine will continue to work on weight loss, exercise, and decreasing simple carbohydrates to help decrease the risk of diabetes.    To continue to follow up with PCP  Obesity, Class I, BMI 30-34.9      Options: Home meal deliveries Meal prepping Use [email or web address]  Multiple handouts given  today:  protein content of foods, snack ideas, cat 2 meal plan, vegetarian and pescatarian meal plans.  Multiple recipes given today.      Return in about 4 weeks (around 07/01/2024).SABRA She was informed of the importance of frequent follow up visits to maximize her success with intensive lifestyle modifications for her multiple health conditions.   ATTESTASTION STATEMENTS:  Reviewed by clinician on day of visit: allergies, medications, problem list, medical history, surgical history, family history, social history, and previous encounter notes.   I personally spent a total of 30+ minutes in the care of the patient today including preparing to see the patient, getting/reviewing separately obtained history, performing a medically appropriate exam/evaluation, counseling and educating, and documenting clinical information in the EHR.    Corean SAUNDERS. Timara Loma FNP-C

## 2024-06-04 LAB — CLOSTRIDIUM DIFFICILE BY PCR: Toxigenic C. Difficile by PCR: NEGATIVE

## 2024-06-05 ENCOUNTER — Ambulatory Visit: Payer: Self-pay | Admitting: Family Medicine

## 2024-06-05 DIAGNOSIS — L570 Actinic keratosis: Secondary | ICD-10-CM | POA: Diagnosis not present

## 2024-06-05 DIAGNOSIS — K529 Noninfective gastroenteritis and colitis, unspecified: Secondary | ICD-10-CM

## 2024-06-05 DIAGNOSIS — L821 Other seborrheic keratosis: Secondary | ICD-10-CM | POA: Diagnosis not present

## 2024-06-05 DIAGNOSIS — L680 Hirsutism: Secondary | ICD-10-CM | POA: Diagnosis not present

## 2024-06-05 LAB — CALPROTECTIN, FECAL: Calprotectin, Fecal: 178 ug/g — ABNORMAL HIGH (ref 0–120)

## 2024-06-06 ENCOUNTER — Ambulatory Visit (HOSPITAL_BASED_OUTPATIENT_CLINIC_OR_DEPARTMENT_OTHER)
Admission: RE | Admit: 2024-06-06 | Discharge: 2024-06-06 | Disposition: A | Discharge status: Home / Self Care | Source: Ambulatory Visit | Attending: Pulmonary Disease | Admitting: Pulmonary Disease

## 2024-06-06 ENCOUNTER — Encounter (HOSPITAL_BASED_OUTPATIENT_CLINIC_OR_DEPARTMENT_OTHER): Payer: Self-pay | Admitting: Pulmonary Disease

## 2024-06-06 DIAGNOSIS — R911 Solitary pulmonary nodule: Secondary | ICD-10-CM | POA: Diagnosis not present

## 2024-06-06 DIAGNOSIS — R0602 Shortness of breath: Secondary | ICD-10-CM | POA: Diagnosis not present

## 2024-06-06 DIAGNOSIS — D869 Sarcoidosis, unspecified: Secondary | ICD-10-CM | POA: Diagnosis not present

## 2024-06-06 DIAGNOSIS — D860 Sarcoidosis of lung: Secondary | ICD-10-CM | POA: Diagnosis not present

## 2024-06-06 DIAGNOSIS — I700 Atherosclerosis of aorta: Secondary | ICD-10-CM | POA: Diagnosis not present

## 2024-06-09 ENCOUNTER — Other Ambulatory Visit: Payer: Self-pay | Admitting: Family Medicine

## 2024-06-09 DIAGNOSIS — I480 Paroxysmal atrial fibrillation: Secondary | ICD-10-CM

## 2024-06-15 DIAGNOSIS — Z23 Encounter for immunization: Secondary | ICD-10-CM | POA: Diagnosis not present

## 2024-06-17 ENCOUNTER — Other Ambulatory Visit: Payer: Self-pay | Admitting: Family Medicine

## 2024-06-17 DIAGNOSIS — E034 Atrophy of thyroid (acquired): Secondary | ICD-10-CM

## 2024-06-25 ENCOUNTER — Ambulatory Visit (INDEPENDENT_AMBULATORY_CARE_PROVIDER_SITE_OTHER): Admitting: Pulmonary Disease

## 2024-06-25 ENCOUNTER — Encounter (HOSPITAL_BASED_OUTPATIENT_CLINIC_OR_DEPARTMENT_OTHER): Payer: Self-pay | Admitting: Pulmonary Disease

## 2024-06-25 VITALS — BP 107/64 | HR 58 | Ht 61.0 in | Wt 181.6 lb

## 2024-06-25 DIAGNOSIS — R053 Chronic cough: Secondary | ICD-10-CM

## 2024-06-25 DIAGNOSIS — G4734 Idiopathic sleep related nonobstructive alveolar hypoventilation: Secondary | ICD-10-CM

## 2024-06-25 DIAGNOSIS — D869 Sarcoidosis, unspecified: Secondary | ICD-10-CM | POA: Diagnosis not present

## 2024-06-25 DIAGNOSIS — Z9225 Personal history of immunosupression therapy: Secondary | ICD-10-CM

## 2024-06-25 DIAGNOSIS — R0902 Hypoxemia: Secondary | ICD-10-CM

## 2024-06-25 MED ORDER — ALBUTEROL SULFATE HFA 108 (90 BASE) MCG/ACT IN AERS: 1.0000 | INHALATION_SPRAY | RESPIRATORY_TRACT | 2 refills | Status: AC | PRN

## 2024-06-25 NOTE — Progress Notes (Signed)
 Subjective:   PATIENT ID: Lisa Acosta GENDER: female DOB: 14-Mar-1951, MRN: [id number]  Chief Complaint  Patient presents with   Sarcoidosis    Reason for Visit: Follow-up  [name] is a 73 year old female never smoker with pulmonary sarcoid GERD, HTN, HLD, atrial fibrillation who presents for follow-up.  Synopsis She was previously seen by Dr. Brenna for pulmonary sarcoid and started on steroids in October 2023 however discontinued in January due to 20 lb weight gain and headaches, irritiability and darker stool. Cough had initially started six months ago. She reports significant fatigue and prior sleep studies have been negative. She is wearing oxygen  nightly. Started methotrexate  in Feb 2024  2024 - Started on methotrexate  in Feb with improved respiratory symptoms. Improved but persistent cough and upper airway mucous production.  09/03/23 Since our last visit she has had side effects related to antidepressants. She reports cough unchanged. Some sputum in her mouth and not necessarily from her cough. Has a sore throat. Compliant with nocturnal oxygen . Daughter present and provides additional history.  11/06/23 Since our last visit she was seen by ENT and has been doing saline rinses, flonase  and allergy pills which has helped. Cough has improved. Recently seen in the ED for confusion and accidentally took additional dose of flecainide . She is concerned methotrexate  is contributing to this. Otherwise doing well. Daughter present and provides additional history.  06/25/24 Since our last visit she reports worsening nonproductive cough and nasal congestion. Has been compliant with her saline rinses, flonase  and zyrtec . She has been taking tessalon  perles nightly. Denies wheezing. Taking omeprazole  daily for reflux. Compliant with her nocturnal oxygen  nightly and reports improving breathing and sleep at night.  Social History: Never smoker. 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 Fib   Esophageal ulcer    Fatty liver    Medication Induced. 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   Pre-diabetes    Sarcoidosis      Family History  Problem Relation Age of Onset   Atrial fibrillation Mother    Congestive Heart Failure Mother    Depression Mother    Heart disease Mother    Stroke Mother    Thyroid  disease Mother    Anxiety disorder Mother    Obesity Mother    Heart disease Father    Alcohol  abuse Father    High blood pressure Father    High Cholesterol Father    Alcoholism Father    Depression Sister    Atrial fibrillation Sister    Heart attack Sister    Heart disease Brother    Heart disease Brother    Depression Daughter      Social History   Occupational History   Occupation:  Retired    Associate Professor: OTHER    Comment: [employer]  Tobacco Use   Smoking status: Never    Passive exposure: Current (doesn't smoke in house now or past 69yrs)   Smokeless tobacco: Never  Vaping Use   Vaping status: Never Used  Substance and Sexual Activity   Alcohol  use: Yes    Comment: rare (1-2x per year)   Drug use: No   Sexual activity: Yes    Allergies  Allergen Reactions   Dextran Anaphylaxis   Oxycontin  [Oxycodone ] Other (See Comments)    Hallucinations   Tree Extract    Penicillin G Rash   Penicillins Rash   Sulfa Antibiotics Rash  and Other (See Comments)   Wound Dressing Adhesive Rash     Outpatient Medications Prior to Visit  Medication Sig Dispense Refill   alendronate  (FOSAMAX ) 70 MG tablet Take 1 tablet (70 mg total) by mouth every 7 (seven) days. Take with a full glass of water on an empty stomach. 12 tablet 3   amLODipine  (NORVASC ) 5 MG tablet TAKE 1 TABLET (5 MG TOTAL) BY MOUTH DAILY. 90 tablet 1   atenolol  (TENORMIN ) 50 MG tablet TAKE 1 TABLET BY MOUTH EVERY DAY 90 tablet 1   benzonatate  (TESSALON ) 200 MG capsule Take 1 capsule (200 mg total) by mouth 3 (three) times daily as needed for cough. 60 capsule 2   Calcium  Carb-Cholecalciferol (CALCIUM  600 + D PO) Take 1 tablet by mouth in the morning and at bedtime.     cetirizine  (ZYRTEC ) 10 MG tablet Take 1 tablet (10 mg total) by mouth daily. 30 tablet 11   flecainide  (TAMBOCOR ) 100 MG tablet Take 1 tablet (100 mg total) by mouth 2 (two) times daily. 180 tablet 3   fluticasone  (FLONASE ) 50 MCG/ACT nasal spray Place 2 sprays into both nostrils 2 (two) times daily. 16 g 6   guaiFENesin  (MUCINEX ) 600 MG 12 hr tablet Take 2 tablets (1,200 mg total) by mouth 2 (two) times daily. (Patient taking differently: Take 1,200 mg by mouth daily.) 30 tablet 0   HYDROcodone -acetaminophen  (NORCO/VICODIN) 5-325 MG tablet Take 1 tablet by mouth every 6 (six) hours as needed for moderate pain (pain score 4-6). 30 tablet 0   levothyroxine  (SYNTHROID ) 125 MCG tablet TAKE 1 TABLET BY MOUTH DAILY BEFORE BREAKFAST. 90 tablet 1   methocarbamol  (ROBAXIN ) 500 MG tablet Take 1 tablet (500 mg total) by mouth every 6 (six) hours as needed. 40 tablet 1   Multiple Vitamins-Minerals (PRESERVISION AREDS 2+MULTI VIT PO) Take 1 capsule by mouth daily.     nystatin -triamcinolone  (MYCOLOG II) cream Apply 1 Application topically 2 (two) times daily. 30 g 0   omeprazole  (PRILOSEC) 40 MG capsule Take 1 capsule (40 mg total) by mouth in the morning and at bedtime. (Patient taking differently: Take 40 mg by  mouth daily.) 180 capsule 1   ondansetron  (ZOFRAN ) 8 MG tablet Take 1 tablet (8 mg total) by mouth every 8 (eight) hours as needed for nausea or vomiting. 30 tablet 2   rivaroxaban  (XARELTO ) 20 MG TABS tablet Take 1 tablet (20 mg total) by mouth daily with supper. 90 tablet 1   rosuvastatin  (CRESTOR ) 20 MG tablet Take 1 tablet (20 mg total) by mouth daily. 90 tablet 1   sertraline  (ZOLOFT ) 100 MG tablet Take 1 tablet (100 mg total) by mouth daily. 90 tablet 3   traZODone  (DESYREL ) 50 MG tablet Take 25 mg in the afternoon as needed for panic.  Take  50 mg at bedtime as needed for insomnia (Patient taking differently: Take 50 mg by mouth.   Take 50 mg at bedtime as needed for insomnia) 135 tablet 0   Propylene Glycol (SYSTANE BALANCE) 0.6 % SOLN Place 1 drop into both eyes as needed (dry eyes). (Patient not taking: Reported on 06/25/2024)     No facility-administered medications prior to visit.    Review of Systems  Constitutional:  Negative for chills, diaphoresis, fever, malaise/fatigue and weight loss.  HENT:  Positive for congestion.   Respiratory:  Positive for cough. Negative for hemoptysis, sputum production, shortness of breath and wheezing.   Cardiovascular:  Negative for chest pain, palpitations and leg swelling.     Objective:   Vitals:   06/25/24 1336  BP: 107/64  Pulse: (!) 58  SpO2: 96%  Weight: 181 lb 9.6 oz (82.4 kg)  Height: 5' 1 (1.549 m)    SpO2: 96 %  Physical Exam: General: Well-appearing, no acute distress HENT: [place], AT Eyes: EOMI, no scleral icterus Respiratory: Clear to auscultation bilaterally.  No crackles, wheezing or rales Cardiovascular: RRR, -M/R/G, no JVD Extremities:-Edema,-tenderness Neuro: AAO x4, CNII-XII grossly intact Psych: Normal mood, normal affect  Data Reviewed:  Imaging: PET 06/07/22 - Hypermetabolic masslike consolidation in LUL, hypermetabolic left supraclavicular, hilar and mediastinal adenopathy. RUL nodule 12 mm new CT Super D  06/26/22 - LUL lung mass with GGO. RUL nodule resolved CT Chest 08/14/22 - Stable LUL focal opacity with surrounding GGO, new reticular opacities in lung bases, stable left supraclavicular, left hilar and prevascular lymph nodes CT Chest 02/10/23 - Slightly decreased LUL with surrounding GGO. Unchanged lower lobe reticulations. Stable mediastinal and hilar lymph nodes. Stable 6mm RML nodule CT Chest 08/13/23 - Stable LUL nodules, small mediastinal lymph nodes CT Chest 06/11/24 - Stable LUL consolidation, stable 7 mm subpleural RML nodule  PFT: 08/29/16  FVC 2.63 (95%) FEV1 2.21 (105%) Ratio 84  TLC 91% DLCO 75% Interpretation: Normal spirometry and lung volumes. Mildly reduced DLCO.  10/05/22 FVC 2.37 (92%) FEV1 2.02 (104%) Ratio 85  TLC 87% DLCO 65% Interpretation: Normal spirometry and lung volumes. Compared to prior reduced DLCO, remains mild   Labs:    Latest Ref Rng & Units 05/29/2024    3:36 PM 01/16/2024    5:20 AM 01/07/2024   12:00 PM  CBC  WBC 4.0 - 10.5 K/uL 5.5  14.1  7.1   Hemoglobin 12.0 - 15.0 g/dL 86.7  88.1  86.2   Hematocrit 36.0 - 46.0 % 39.6  35.2  42.1   Platelets 150.0 - 400.0 K/uL 209.0  183  217   Normal blood counts     Latest Ref Rng & Units 05/29/2024    3:36 PM 01/16/2024    5:20 AM 01/07/2024   12:00 PM  CMP  Glucose 70 - 99 mg/dL 93  866  899   BUN 6 - 23 mg/dL 26  15  25    Creatinine 0.40 - 1.20 mg/dL 9.32  9.37  9.37   Sodium 135 - 145 mEq/L 139  140  140   Potassium 3.5 - 5.1 mEq/L 4.1  4.3  4.2   Chloride 96 - 112 mEq/L 105  105  103   CO2 19 - 32 mEq/L 28  27  28    Calcium  8.4 - 10.5 mg/dL 9.3  8.7  9.6   Total Protein 6.0 - 8.3 g/dL 7.0   7.1   Total Bilirubin 0.2 - 1.2 mg/dL 0.7   1.1  Alkaline Phos 39 - 117 U/L 85   66   AST 0 - 37 U/L 17   20   ALT 0 - 35 U/L 12   18   Normal CMET  Pathology: 06/29/22 LUL Needle, endobronchial biopsy- Numerous noncaseating granulomas  Sleep: PSG 03/30/22 - No sleep apnea. Nocturnal hypoxemia  Overnight  Oximetry Results Date: 06/26/23 SpO2 <88% 1 hour 34 min 37 sec.  [place] SpO2 74%  Qualified for oxygen      Assessment & Plan:   Discussion: 73 year old female never smoker with pulmonary sarcoid GERD, HTN, HLD, atrial fibrillation who presents for sarcoid follow-up. We discussed the clinical course of sarcoid and management including serial PFTs, labs, eye exam, and EKG and chest imaging if indicated. If symptoms suggest sarcoid flare in the future, we would manage with steroids +/- biologics.  Completed 16 months of immunosuppression with methotrexate . Reviewed CT imaging with stable nodules for 4 months after stopping methotrexate  in June 2025.  Assessment & Plan Sarcoidosis Stable off immunosuppressants --Dx in 05/29/22 and 06/15/22 via endobronchial bx --PET/CT 06/07/22 Hypermetabolic masslike consolidation with mediastinal/hilar adenopathy --CT Chest 02/10/23 Improved LUL consolidation --CT 08/13/23 Stable LUL consolidation but improved lymph nodes --CT 06/06/24 stable LUL consolidation --ORDER CT Chest without contrast in 6 months (March 2026) Nocturnal hypoxemia --CONTINUE 2L O2 via [place] nightly --Due for recertification. 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 May need to consider sarcoid-related since off immunosuppressants --Keep follow-up with ENT --START albuterol  TWO puffs as needed for cough History of immunosuppression --Prednisone  30 mg 06/2022-09/2022 . Discontinued d/t agitation, HA. No taper --Methotrexate  started Feb 11/04/22 --Methotrexate  weaning started 11/06/23 --Methotrexate  off in June 2025  Sarcoid Monitoring --Chest imaging as indicated --Annual PFTs.  Last PFTs 09/2022. Mildly reduced DLCO --Annual ophthalmology exam. Last exam 12/26/22 --[facility] --Routine labs as needed: CBC with diff, CMET, 1, 25 and 25 hydroxy vitamin D , urinary calcium    Health Maintenance Immunization History   Administered Date(s) Administered    sv, Bivalent, Protein Subunit Rsvpref,pf [place]) 10/04/2023   Fluad Quad(high Dose 65+) 05/02/2019, 06/23/2020, 06/30/2021, 05/29/2022   INFLUENZA, HIGH DOSE SEASONAL PF 06/14/2016, 05/15/2018, 05/29/2024   Influenza,inj,Quad PF,6+ Mos 06/11/2014   Influenza,inj,quad, With Preservative 06/11/2017   Influenza-Unspecified 06/12/2023   PFIZER(Purple Top)SARS-COV-2 Vaccination 10/03/2019, 10/24/2019, 06/17/2020, 03/16/2021   Pfizer Covid-19 Vaccine Bivalent Booster 21yrs & up 08/02/2021   Pfizer(Comirnaty)Fall Seasonal Vaccine 12 years and older 10/04/2023, 06/15/2024   Pneumococcal Conjugate-13 08/07/2016   Pneumococcal Polysaccharide-23 01/28/2018   Tdap 01/28/2018   CT Lung Screen - not qualified. Serial imaging as noted above  Orders Placed This Encounter  Procedures   CT Chest Wo Contrast    Standing Status:   Future    Expiration Date:   06/25/2025    Scheduling Instructions:     Schedule in 6 months in March 2026    Preferred imaging location?:   [facility]   Pulse oximetry, overnight    Standing Status:   Future    Expiration Date:   06/25/2025    Scheduling Instructions:     On room air   Pulmonary function test    Standing Status:   Future    Expiration Date:   06/25/2025    Where should this test be performed?:   Outpatient Pulmonary    What type of PFT is being ordered?:   Full PFT   Meds ordered this encounter  Medications  albuterol  (VENTOLIN  HFA) 108 (90 Base) MCG/ACT inhaler    Sig: Inhale 1-2 puffs into the lungs every 4 (four) hours as needed for wheezing or shortness of breath.    Dispense:  8.5 g    Refill:  2    Return in about 6 months (around 12/24/2024) for after PFT.  I have spent a total time of 45-minutes on the day of the appointment including chart review, data review, collecting history, coordinating care and discussing medical diagnosis and plan with the patient/family. Past medical history,  allergies, medications were reviewed. Pertinent imaging, labs and tests included in this note have been reviewed and interpreted independently by me.   Trinitee Horgan Slater Staff, MD [facility] 06/25/2024 2:02 PM

## 2024-06-25 NOTE — Patient Instructions (Addendum)
 Pulmonary sarcoidosis  Chronic cough --ORDER CT Chest without contrast in 6 months (March 2026) --Keep follow-up with ENT --START albuterol  TWO puffs as needed for cough  Nocturnal hypoxemia Due for recertification

## 2024-07-02 NOTE — Assessment & Plan Note (Signed)
--  CONTINUE 2L O2 via [place] nightly --Due for recertification. ONO on room air ordered

## 2024-07-02 NOTE — Assessment & Plan Note (Signed)
 Stable off immunosuppressants --Dx in 05/29/22 and 06/15/22 via endobronchial bx --PET/CT 06/07/22 Hypermetabolic masslike consolidation with mediastinal/hilar adenopathy --CT Chest 02/10/23 Improved LUL consolidation --CT 08/13/23 Stable LUL consolidation but improved lymph nodes --CT 06/06/24 stable LUL consolidation --ORDER CT Chest without contrast in 6 months (March 2026)

## 2024-07-07 ENCOUNTER — Ambulatory Visit: Admitting: Nurse Practitioner

## 2024-07-08 ENCOUNTER — Ambulatory Visit (INDEPENDENT_AMBULATORY_CARE_PROVIDER_SITE_OTHER): Admitting: Otolaryngology

## 2024-07-14 ENCOUNTER — Encounter: Payer: Self-pay | Admitting: Radiology

## 2024-07-14 DIAGNOSIS — R195 Other fecal abnormalities: Secondary | ICD-10-CM | POA: Diagnosis not present

## 2024-07-14 DIAGNOSIS — K219 Gastro-esophageal reflux disease without esophagitis: Secondary | ICD-10-CM | POA: Diagnosis not present

## 2024-07-14 DIAGNOSIS — R197 Diarrhea, unspecified: Secondary | ICD-10-CM | POA: Diagnosis not present

## 2024-07-16 ENCOUNTER — Telehealth (HOSPITAL_BASED_OUTPATIENT_CLINIC_OR_DEPARTMENT_OTHER): Payer: Self-pay

## 2024-07-16 NOTE — Telephone Encounter (Signed)
 Lisa Acosta,  You saw this patient on 05/26/2024. Per protocol we request that you comment on his cardiac risk to proceed with colonoscopy since it has been less than 2 months since evaluated in the office. Please send your comment to P CV Pre-Op Pool. I have reached out to pharmacy on Xarelto  hold.   Thank you, Lamarr Satterfield DNP, ANP, AACC.

## 2024-07-16 NOTE — Telephone Encounter (Signed)
   Pre-operative Risk Assessment    Patient Name: Lisa Acosta  DOB: 02/01/1951 MRN: [id number]   Date of last office visit: 05/26/24 with Emelia Date of next office visit: NA  Request for Surgical Clearance    Procedure:  Colonoscopy  Date of Surgery:  Clearance TBD                                Surgeon:  Not indicated Surgeon's Group or Practice Name:  [facility] [place] Phone number:  [phone] Fax number:  [phone]   Type of Clearance Requested:   - Medical  - Pharmacy:  Hold Rivaroxaban  (Xarelto ) 2 days prior   Type of Anesthesia:  Not Indicated   Additional requests/questions:    [name]   07/16/2024, 9:37 AM

## 2024-07-16 NOTE — Telephone Encounter (Signed)
 Pharmacy please advise on holding Xarelto  prior to colonoscopy scheduled for TBD. Last labs CBC and CMET (05/29/2024).Thank you.

## 2024-07-21 ENCOUNTER — Ambulatory Visit (INDEPENDENT_AMBULATORY_CARE_PROVIDER_SITE_OTHER)

## 2024-07-21 ENCOUNTER — Encounter (INDEPENDENT_AMBULATORY_CARE_PROVIDER_SITE_OTHER): Payer: Self-pay

## 2024-07-21 VITALS — BP 122/77 | HR 53

## 2024-07-21 DIAGNOSIS — R0981 Nasal congestion: Secondary | ICD-10-CM

## 2024-07-21 DIAGNOSIS — H608X1 Other otitis externa, right ear: Secondary | ICD-10-CM

## 2024-07-21 DIAGNOSIS — R053 Chronic cough: Secondary | ICD-10-CM | POA: Diagnosis not present

## 2024-07-21 DIAGNOSIS — J343 Hypertrophy of nasal turbinates: Secondary | ICD-10-CM

## 2024-07-21 MED ORDER — FLUOCINOLONE ACETONIDE 0.01 % OT OIL: 2.0000 [drp] | TOPICAL_OIL | Freq: Two times a day (BID) | OTIC | 0 refills | Status: AC

## 2024-07-21 NOTE — Progress Notes (Unsigned)
 Dear Dr. Watt, Here is my assessment for our mutual patient, Lisa Acosta. Thank you for allowing me the opportunity to care for your patient. Please do not hesitate to contact me should you have any other questions. Sincerely, Dr. Hadassah Parody  Otolaryngology Clinic Note Referring provider: Dr. Watt HPI:   (07/21/2024) Discussed the use of AI scribe software for clinical note transcription with the patient, who gave verbal consent to proceed.  History of Present Illness Lisa Acosta is a 73 year old female who presents with persistent cough and mucus production.  Prior pt of Dr. Soldatova. Here for fl/u   Cough - Persistent, sporadic cough with mucus production - Symptoms have improved since starting saline rinses but remain present. Has not been able to use saline rinses more recently after knee surgery and rehab stays  - Uses cough medicine at night as needed  Nasal congestion  - Difficulty breathing through nose, though manageable - Previously, regular sinus rinses helped reduce mucus management - Uses Flonase  for nasal symptoms - Takes Zyrtec  at night - Uses generic Mucinex  from [employer]  Right ear pruritus and inflammation - Recent cerumen impaction requiring removal at another office.  - Post-removal, ear was red and inflamed and treated with topical ear antibiotics - Two weeks post-treatment, ear was said to be 85% healed per patient  - Continues to experience itching in right ear  PMH:  - Sarcoidosis  On Xarelto  for Afib   Independent Review of Additional Tests or Records:  Note by Elena Larry 10/01/23: chronic cough attributed to sarcoidosis with excessive mucus production, PND, GERD. 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 Fib   Esophageal ulcer    Fatty liver    Medication Induced. 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   Pre-diabetes    Sarcoidosis      Past Surgical History:  Procedure Laterality Date   BREAST BIOPSY Left    needle core biopsy, benign   BRONCHIAL BIOPSY  06/15/2022   Procedure: BRONCHIAL BIOPSIES;  Surgeon: Meade Verdon RAMAN, MD;  Location: [facility] ENDOSCOPY;  Service: Pulmonary;;   BRONCHIAL BIOPSY  06/29/2022   Procedure: BRONCHIAL BIOPSIES;  Surgeon: Brenna Adine CROME, DO;  Location: [facility] ENDOSCOPY;  Service: Pulmonary;;   BRONCHIAL NEEDLE ASPIRATION BIOPSY  06/15/2022   Procedure: BRONCHIAL NEEDLE ASPIRATION BIOPSIES;  Surgeon: Meade Verdon RAMAN, MD;  Location: [facility] ENDOSCOPY;  Service: Pulmonary;;   BRONCHIAL NEEDLE ASPIRATION BIOPSY  06/29/2022   Procedure: BRONCHIAL NEEDLE ASPIRATION BIOPSIES;  Surgeon: Brenna Adine CROME, DO;  Location: [facility] ENDOSCOPY;  Service: Pulmonary;;   BRONCHIAL WASHINGS  06/15/2022   Procedure: BRONCHIAL WASHINGS;  Surgeon: Meade Verdon RAMAN,  MD;  Location: [facility] ENDOSCOPY;  Service: Pulmonary;;   CATARACT EXTRACTION W/ INTRAOCULAR LENS IMPLANT     COLONOSCOPY     DILATION AND CURETTAGE OF UTERUS     Miscarriage   HAND SURGERY Left    after a fall   KNEE SURGERY Right    x2   LASIK     ROTATOR CUFF REPAIR Right    x2   TOTAL ABDOMINAL HYSTERECTOMY     TOTAL KNEE ARTHROPLASTY Right 01/15/2024   Procedure: ARTHROPLASTY, KNEE, TOTAL;  Surgeon: Vernetta Lonni GRADE, MD;   Location: [facility] OR;  Service: Orthopedics;  Laterality: Right;   VIDEO BRONCHOSCOPY N/A 06/15/2022   Procedure: VIDEO BRONCHOSCOPY WITH FLUORO;  Surgeon: Meade Verdon RAMAN, MD;  Location: [facility] ENDOSCOPY;  Service: Pulmonary;  Laterality: N/A;   VIDEO BRONCHOSCOPY WITH ENDOBRONCHIAL ULTRASOUND  06/15/2022   Procedure: VIDEO BRONCHOSCOPY WITH ENDOBRONCHIAL ULTRASOUND;  Surgeon: Meade Verdon RAMAN, MD;  Location: [facility] ENDOSCOPY;  Service: Pulmonary;;   VIDEO BRONCHOSCOPY WITH ENDOBRONCHIAL ULTRASOUND N/A 06/29/2022   Procedure: VIDEO BRONCHOSCOPY WITH ENDOBRONCHIAL ULTRASOUND;  Surgeon: Brenna Adine CROME, DO;  Location: [facility] ENDOSCOPY;  Service: Pulmonary;  Laterality: N/A;    Family History  Problem Relation Age of Onset   Atrial fibrillation Mother    Congestive Heart Failure Mother    Depression Mother    Heart disease Mother    Stroke Mother    Thyroid  disease Mother    Anxiety disorder Mother    Obesity Mother    Heart disease Father    Alcohol  abuse Father    High blood pressure Father    High Cholesterol Father    Alcoholism Father    Depression Sister    Atrial fibrillation Sister    Heart attack Sister    Heart disease Brother    Heart disease Brother    Depression Daughter      Social Connections: Socially Integrated (01/15/2024)   Social Connection and Isolation Panel    Frequency of Communication with Friends and Family: Once a week    Frequency of Social Gatherings with Friends and Family: More than three times a week    Attends Religious Services: More than 4 times per year    Active Member of [employer] or Organizations: Yes    Attends Engineer, Structural: More than 4 times per year    Marital Status: Married     Current Outpatient Medications  Medication Instructions   albuterol  (VENTOLIN  HFA) 108 (90 Base) MCG/ACT inhaler 1-2 puffs, Inhalation, Every 4 hours PRN   alendronate  (FOSAMAX ) 70 mg, Oral, Every 7 days, Take with a full glass of water on an empty stomach.    amLODipine  (NORVASC ) 5 mg, Oral, Daily   atenolol  (TENORMIN ) 50 mg, Oral, Daily   benzonatate  (TESSALON ) 200 mg, Oral, 3 times daily PRN   Calcium  Carb-Cholecalciferol (CALCIUM  600 + D PO) 1 tablet, 2 times daily   cetirizine  (ZYRTEC ) 10 mg, Oral, Daily   flecainide  (TAMBOCOR ) 100 mg, Oral, 2 times daily   Fluocinolone Acetonide (DERMOTIC) 0.01 % OIL 2 drops, OTIC (EAR), 2 times daily   fluticasone  (FLONASE ) 50 MCG/ACT nasal spray 2 sprays, Each Nare, 2 times daily   guaiFENesin  (MUCINEX ) 1,200 mg, Oral, 2 times daily   HYDROcodone -acetaminophen  (NORCO/VICODIN) 5-325 MG tablet 1 tablet, Oral, Every 6 hours PRN   levothyroxine  (SYNTHROID ) 125 mcg, Oral, Daily before breakfast   methocarbamol  (ROBAXIN ) 500 mg, Oral, Every 6 hours PRN   Multiple Vitamins-Minerals (PRESERVISION AREDS 2+MULTI VIT PO)  1 capsule, Daily   nystatin -triamcinolone  (MYCOLOG II) cream 1 Application, Topical, 2 times daily   omeprazole  (PRILOSEC) 40 mg, Oral, 2 times daily   ondansetron  (ZOFRAN ) 8 mg, Oral, Every 8 hours PRN   Propylene Glycol (SYSTANE BALANCE) 0.6 % SOLN 1 drop, As needed   rivaroxaban  (XARELTO ) 20 mg, Oral, Daily with supper   rosuvastatin  (CRESTOR ) 20 mg, Oral, Daily   sertraline  (ZOLOFT ) 100 mg, Oral, Daily   traZODone  (DESYREL ) 50 MG tablet Take 25 mg in the afternoon as needed for panic.  Take 50 mg at bedtime as needed for insomnia     Physical Exam:   BP 122/77 (BP Location: Left Arm, Patient Position: Sitting)   Pulse (!) 53   SpO2 93%   Salient findings:  CN II-XII intact Left EAC clear and TM intact with well pneumatized middle ear spaces Right EAC with mild otitis externa, no cerumen present, shiny canal skin, TM intact, ME well aerated  Anterior rhinoscopy: Septum midline anteriorly; bilateral inferior turbinates with hypertrophy  No lesions of oral cavity/oropharynx No obviously palpable neck masses/lymphadenopathy/thyromegaly No respiratory distress or stridor  Seprately  Identifiable Procedures:  Prior to initiating any procedures, risks/benefits/alternatives were explained to the patient and verbal consent obtained. None  Impression & Plans:  Lisa Acosta is a 73 y.o. female with   1. Nasal congestion   2. Hypertrophy of both inferior nasal turbinates   3. Chronic cough   4. 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 Discussed voice therapy for cough suppression but cough is not bothersome enough for this. No immediate need for voice therapy. - Continue current management with nighttime cough medicine as needed.  Chronic nasal and sinus mucus production with pressure Chronic nasal and sinus mucus production with pressure, primarily affecting the nose and sinuses. Flonase  and saline rinses beneficial. No recent sinus infections.  - Continue Flonase  and saline rinses. - Increase frequency of saline rinses if needed. - Hold off on additional nasal sprays or CT scan of sinuses for now.  Right ear canal irritation with pruritus Right otitis externa  - Prescribed steroid ear drops to be used twice daily for one week. - Advised to call if pruritus persists after treatment.  See below regarding exact medications prescribed this encounter including dosages and route: Meds ordered this encounter  Medications   Fluocinolone Acetonide (DERMOTIC) 0.01 % OIL    Sig: Place 2 drops in ear(s) in the morning and at bedtime for 7 days.    Dispense:  20 mL    Refill:  0    Thank you for allowing me the opportunity to care for your patient. Please do not hesitate to contact me should you have any other questions.  Sincerely, Hadassah Parody, MD Otolaryngologist (ENT), [facility] Phone: [phone] Fax: [phone]

## 2024-07-23 NOTE — Telephone Encounter (Signed)
 Patient with diagnosis of atrial fibrillation on Xarelto  for anticoagulation.    Procedure:  Colonoscopy   Date of Surgery:  Clearance TBD      CHA2DS2-VASc Score = 3   This indicates a 3.2% annual risk of stroke. The patient's score is based upon: CHF History: 0 HTN History: 1 Diabetes History: 0 Stroke History: 0 Vascular Disease History: 0 Age Score: 1 Gender Score: 1    CrCl 72 ml/min Platelet count 209 K  Patient has not had an Afib/aflutter ablation in the last 3 months, DCCV within the last 4 weeks or a watchman implanted in the last 45 days   Per office protocol, patient can hold Xarelto  for 2 days prior to procedure.   Patient will not need bridging with Lovenox (enoxaparin) around procedure.  **This guidance is not considered finalized until pre-operative APP has relayed final recommendations.**

## 2024-07-24 ENCOUNTER — Telehealth: Payer: Self-pay | Admitting: Radiology

## 2024-07-24 NOTE — Telephone Encounter (Signed)
     Primary Cardiologist: Redell Shallow, MD  Chart reviewed as part of pre-operative protocol coverage. Given past medical history and time since last visit, based on ACC/AHA guidelines, Bev Alexsus Papadopoulos would be at acceptable risk for the planned procedure without further cardiovascular testing.   Procedure:  Colonoscopy   Date of Surgery:  Clearance TBD        CHA2DS2-VASc Score = 3   This indicates a 3.2% annual risk of stroke. The patient's score is based upon: CHF History: 0 HTN History: 1 Diabetes History: 0 Stroke History: 0 Vascular Disease History: 0 Age Score: 1 Gender Score: 1     CrCl 72 ml/min Platelet count 209 K   Patient has not had an Afib/aflutter ablation in the last 3 months, DCCV within the last 4 weeks or a watchman implanted in the last 45 days    Per office protocol, patient can hold Xarelto  for 2 days prior to procedure.   Patient will not need bridging with Lovenox (enoxaparin) around procedure.  I will route this recommendation to the requesting party via Epic fax function and remove from pre-op pool.  Please call with questions.  Josefa HERO. Kaan Tosh NP-C     07/24/2024, 11:01 AM [facility] [address] [place], [place] [address] Office [phone]

## 2024-07-24 NOTE — Telephone Encounter (Signed)
 Gigi from [facility] called and requested us  to fax the pre-med dental protocol to them, I faxed it to [phone] for Dr. Mort.

## 2024-07-25 ENCOUNTER — Encounter (HOSPITAL_BASED_OUTPATIENT_CLINIC_OR_DEPARTMENT_OTHER): Payer: Self-pay | Admitting: Pulmonary Disease

## 2024-07-25 ENCOUNTER — Telehealth: Payer: Self-pay | Admitting: Pulmonary Disease

## 2024-07-25 NOTE — Telephone Encounter (Signed)
 Pt.notified

## 2024-07-25 NOTE — Telephone Encounter (Signed)
 Can we check on this was ordered on 10/15

## 2024-07-25 NOTE — Telephone Encounter (Signed)
 Render Sherlean SAILOR to Trudy Warren CROME, [place]    07/25/24 12:24 PM Note Patient has been contact by [facility] to get the ONO sent to her location. She needs to contact [phone].      Trudy Warren CROME, CMA to [facility] Pool    07/25/24 11:27 AM Note Can we check on this was ordered on 10/15     Dewanda Lee Sem to [facility] (supporting Chi Slater Staff, MD)     07/25/24 11:10 AM Dr. Staff, I haven't heard from anyone about the overnight oximeter test you said I needed. Please advise.  Called and spoke with patient and she was given the above phone number to contact and schedule delivery of her ONO.   She voiced her understanding

## 2024-07-25 NOTE — Telephone Encounter (Signed)
 Patient has been contact by [facility] to get the ONO sent to her location. She needs to contact [phone].

## 2024-07-28 DIAGNOSIS — R1310 Dysphagia, unspecified: Secondary | ICD-10-CM | POA: Diagnosis not present

## 2024-07-28 DIAGNOSIS — M19041 Primary osteoarthritis, right hand: Secondary | ICD-10-CM | POA: Diagnosis not present

## 2024-07-28 DIAGNOSIS — R197 Diarrhea, unspecified: Secondary | ICD-10-CM | POA: Diagnosis not present

## 2024-07-28 DIAGNOSIS — E039 Hypothyroidism, unspecified: Secondary | ICD-10-CM | POA: Diagnosis not present

## 2024-07-28 DIAGNOSIS — K0889 Other specified disorders of teeth and supporting structures: Secondary | ICD-10-CM | POA: Diagnosis not present

## 2024-07-28 DIAGNOSIS — R195 Other fecal abnormalities: Secondary | ICD-10-CM | POA: Diagnosis not present

## 2024-07-28 DIAGNOSIS — G709 Myoneural disorder, unspecified: Secondary | ICD-10-CM | POA: Diagnosis not present

## 2024-07-28 DIAGNOSIS — M19042 Primary osteoarthritis, left hand: Secondary | ICD-10-CM | POA: Diagnosis not present

## 2024-07-28 DIAGNOSIS — I480 Paroxysmal atrial fibrillation: Secondary | ICD-10-CM | POA: Diagnosis not present

## 2024-07-28 DIAGNOSIS — K2210 Ulcer of esophagus without bleeding: Secondary | ICD-10-CM | POA: Diagnosis not present

## 2024-07-28 DIAGNOSIS — K760 Fatty (change of) liver, not elsewhere classified: Secondary | ICD-10-CM | POA: Diagnosis not present

## 2024-07-28 DIAGNOSIS — K648 Other hemorrhoids: Secondary | ICD-10-CM | POA: Diagnosis not present

## 2024-07-28 DIAGNOSIS — K2100 Gastro-esophageal reflux disease with esophagitis, without bleeding: Secondary | ICD-10-CM | POA: Diagnosis not present

## 2024-07-28 DIAGNOSIS — G4730 Sleep apnea, unspecified: Secondary | ICD-10-CM | POA: Diagnosis not present

## 2024-07-28 DIAGNOSIS — I10 Essential (primary) hypertension: Secondary | ICD-10-CM | POA: Diagnosis not present

## 2024-07-28 DIAGNOSIS — K219 Gastro-esophageal reflux disease without esophagitis: Secondary | ICD-10-CM | POA: Diagnosis not present

## 2024-07-28 DIAGNOSIS — K5730 Diverticulosis of large intestine without perforation or abscess without bleeding: Secondary | ICD-10-CM | POA: Diagnosis not present

## 2024-07-28 DIAGNOSIS — M19019 Primary osteoarthritis, unspecified shoulder: Secondary | ICD-10-CM | POA: Diagnosis not present

## 2024-07-31 DIAGNOSIS — R197 Diarrhea, unspecified: Secondary | ICD-10-CM | POA: Diagnosis not present

## 2024-08-01 ENCOUNTER — Telehealth: Payer: Self-pay | Admitting: Family Medicine

## 2024-08-01 DIAGNOSIS — F330 Major depressive disorder, recurrent, mild: Secondary | ICD-10-CM | POA: Diagnosis not present

## 2024-08-01 NOTE — Telephone Encounter (Signed)
 Copied from CRM [id number]. Topic: Medicare AWV >> Aug 01, 2024 11:28 AM Nathanel DEL wrote: Called LVM 08/01/2024 to sched AWV. Please schedule in office or virtual visit  Nathanel Paschal; Care Guide Ambulatory Clinical Support [place] [facility] Direct Dial: [phone]

## 2024-08-04 ENCOUNTER — Other Ambulatory Visit (INDEPENDENT_AMBULATORY_CARE_PROVIDER_SITE_OTHER): Payer: Self-pay

## 2024-08-04 ENCOUNTER — Encounter: Payer: Self-pay | Admitting: Orthopaedic Surgery

## 2024-08-04 ENCOUNTER — Ambulatory Visit (INDEPENDENT_AMBULATORY_CARE_PROVIDER_SITE_OTHER): Admitting: Orthopaedic Surgery

## 2024-08-04 DIAGNOSIS — Z96651 Presence of right artificial knee joint: Secondary | ICD-10-CM

## 2024-08-04 NOTE — Progress Notes (Signed)
 The patient is a 73 year old female who is now 6 months status post right cemented total knee arthroplasty.  She says she is doing well but said some stiffness recently.  She is dealing with some intestinal issues and has had to have a lot of blood work as a relates to that.  She says she has not been able to exercise as much and she is using a cane to ambulate.  On exam her right knee shows some slight swelling to be expected but overall she has improved motion with flexion and extension.  The knee feels stable.  2 views of the right knee show well-seated right total knee arthroplasty with no complicating features.  She will continue to increase her activities and work on range of motion as she is able to do so given her other health issues.  From our standpoint, we will see her back in 6 months with a repeat standing AP and lateral of the right knee.  If there are issues before then she knows to reach out and let us  know.

## 2024-08-15 DIAGNOSIS — F330 Major depressive disorder, recurrent, mild: Secondary | ICD-10-CM | POA: Diagnosis not present

## 2024-08-20 DIAGNOSIS — R0902 Hypoxemia: Secondary | ICD-10-CM | POA: Diagnosis not present

## 2024-08-20 DIAGNOSIS — G4730 Sleep apnea, unspecified: Secondary | ICD-10-CM | POA: Diagnosis not present

## 2024-08-22 ENCOUNTER — Encounter: Payer: Self-pay | Admitting: Pulmonary Disease

## 2024-08-25 ENCOUNTER — Ambulatory Visit (HOSPITAL_BASED_OUTPATIENT_CLINIC_OR_DEPARTMENT_OTHER): Payer: Self-pay | Admitting: Pulmonary Disease

## 2024-08-25 ENCOUNTER — Encounter: Payer: Self-pay | Admitting: Family Medicine

## 2024-08-25 DIAGNOSIS — G4734 Idiopathic sleep related nonobstructive alveolar hypoventilation: Secondary | ICD-10-CM

## 2024-08-26 NOTE — Progress Notes (Signed)
 Order placed.NFN

## 2024-09-04 ENCOUNTER — Other Ambulatory Visit: Payer: Self-pay | Admitting: Family Medicine

## 2024-09-04 DIAGNOSIS — I480 Paroxysmal atrial fibrillation: Secondary | ICD-10-CM

## 2024-09-04 DIAGNOSIS — R002 Palpitations: Secondary | ICD-10-CM

## 2024-09-05 ENCOUNTER — Encounter: Payer: Self-pay | Admitting: *Deleted

## 2024-09-12 ENCOUNTER — Telehealth: Payer: Self-pay | Admitting: Internal Medicine

## 2024-09-12 NOTE — Telephone Encounter (Addendum)
 She can have next available with me

## 2024-09-12 NOTE — Telephone Encounter (Signed)
 Good morning Dr. Avram  The following patient needs to be seen for chronic diarrhea and pain. She has been a patient of [facility] but due to them no being able to see her until April, she wishes to see a new provider. She stated that she has had diarrhea for over a month now and very uncomfortable. Records are available in care everywhere. Please review and advise of scheduling  Thank you.

## 2024-09-29 ENCOUNTER — Ambulatory Visit: Admitting: Internal Medicine

## 2024-09-29 ENCOUNTER — Other Ambulatory Visit

## 2024-09-29 ENCOUNTER — Encounter: Payer: Self-pay | Admitting: Internal Medicine

## 2024-09-29 VITALS — BP 110/80 | HR 57 | Ht 61.0 in | Wt 183.0 lb

## 2024-09-29 DIAGNOSIS — D849 Immunodeficiency, unspecified: Secondary | ICD-10-CM

## 2024-09-29 DIAGNOSIS — Z9981 Dependence on supplemental oxygen: Secondary | ICD-10-CM | POA: Diagnosis not present

## 2024-09-29 DIAGNOSIS — R1013 Epigastric pain: Secondary | ICD-10-CM | POA: Diagnosis not present

## 2024-09-29 DIAGNOSIS — R159 Full incontinence of feces: Secondary | ICD-10-CM

## 2024-09-29 DIAGNOSIS — K529 Noninfective gastroenteritis and colitis, unspecified: Secondary | ICD-10-CM

## 2024-09-29 DIAGNOSIS — R32 Unspecified urinary incontinence: Secondary | ICD-10-CM

## 2024-09-29 DIAGNOSIS — K6289 Other specified diseases of anus and rectum: Secondary | ICD-10-CM

## 2024-09-29 NOTE — Patient Instructions (Addendum)
 Change taking your Imodium to 2 in the morning and 1 in the afternoon.   Your provider has requested that you go to the basement level for lab work before leaving today. Press B on the elevator. The lab is located at the first door on the left as you exit the elevator.  [facility] will reach out to you for an appointment.   Low-Fiber Eating Plan Fiber is found in fruits, vegetables, whole grains, and beans. Eating a diet low in fiber helps you poop less often. A low-fiber eating plan may help your digestive system heal if: You have certain conditions, such as Crohn's disease, diverticulitis, or irritable bowel syndrome (IBS), and are having a flare-up. You have had radiation therapy on your pelvis or bowel. You have had surgery on your intestines. You have a new surgical opening in your abdomen called a colostomy or ileostomy. You have an intestine that has narrowed. Your health care provider will tell you how long to stay on this diet. You may find it helpful to work with a dietitian to plan your meals. What are tips for following this plan? Reading food labels  Check the nutrition facts label on food products for the amount of dietary fiber. Choose foods that have less than 2 grams (g) of fiber per serving. General information Eat 5-6 small meals throughout the day instead of 3 large meals. Follow instructions from your provider about how much fiber you should have each day and for how long you should follow a low fiber diet. Most people on a low-fiber eating plan should eat less than 10 g of fiber a day. Your daily fiber goal is _________________ g. What foods should I eat? Fruits Soft-cooked or canned fruits without skin and seeds. Ripe banana. Applesauce. Fruit juice without pulp. Vegetables Well-cooked or canned vegetables without skin, seeds, or stems. Cooked potatoes without skins. Vegetable juice. Grains All bread and crackers made with white flour. Waffles, pancakes, and  French toast. Bagels. Pretzels. Melba toast, zwieback, and matzoh. Cooked and dried cereals that do not have whole grains, added fiber, seeds, or dried fruit. Kurtis. Hot and cold cereals made with refined corn, rice, or oats. Plain pasta and noodles. White rice. Meats and other proteins Ground meat. Tender cuts of meat or poultry. Eggs. Fish, seafood, and shellfish. Smooth nut butters. Tofu. Dairy All milk products and drinks. Lactose-free milk, including rice, soy, and almond milk. Yogurt without fruit, nuts, chocolate, or granola mixed in. Sour cream. Cottage cheese. Cheese. Fats and oils Olive oil, canola oil, sunflower oil, flaxseed oil, avocado oil, and grapeseed oil. Mayonnaise. Cream cheese. Margarine. Butter. Beverages Decaf coffee. Fruit and vegetable juices. Smoothies in small amounts, with no pulp or skins, and with fruits from the recommended list. Sports drinks. Herbal tea. Water. Sweets and desserts Plain cakes. Cookies. Cream pies and pies made with recommended fruits. Pudding. Custard. Fruit gelatin. Sherbet. Ice pops. Ice cream without nuts. Hard candy. Honey. Jelly. Molasses. Syrups. Chocolate. Marshmallows. Gumdrops. Seasonings and condiments Ketchup. Mild mustard. Mild salad dressings. Plain gravies. Vinegar. Spices in moderation. Salt. Sugar. Other foods Bouillon. Broth. Cream and strained soups made from recommended foods. Casseroles made with recommended foods. The items listed above may not be all the foods and drinks you can have. Talk to a dietitian to learn more. What foods should I avoid? Fruits Raw or dried fruit. Berries. Fruit juice with pulp. Prune juice. Vegetables Potato skins. Raw or undercooked vegetables. All beans and bean sprouts. Cooked greens.  Corn. Peas. Cabbage. Beets. Broccoli. Brussels sprouts. Cauliflower. Mushrooms. Onions. Peppers. Parsnips. Okra. Sauerkraut. Grains Whole-wheat, whole-grain, or multigrain breads, cereals, or crackers. Rye bread.  Cereals with nuts, raisins, or coconut. Bran. Granola. High-fiber cereals. Cornmeal or corn bread. Whole-grain pasta. Wild or brown rice. Quinoa. Popcorn. Buckwheat. Wheat germ. Meats and other proteins Tough, fibrous meats with gristle. Fatty meat. Poultry with skin. Fried meat, environmental education officer, or fish. Precooked or cured meat, such as sausages or meat loaves. Aldona. Hot dogs. Nuts and chunky nut butter. Dried peas, beans, and lentils. Hummus. Dairy Yogurt with fruit, nuts, chocolate, or granola mixed in. Full-fat dairy such as whole milk, ice cream, or sour cream. Beverages Caffeinated coffee and teas. Fats and oils Avocado. Coconut. Butter. Sweets and desserts Desserts, cookies, or candies that contain nuts or coconut. Dried fruit. Jams and preserves with seeds. Marmalade. Any dessert made with fruits or grains that are not recommended. Seasonings and condiments Relish. Horseradish. Dene. Olives. Other foods Corn tortilla chips. Soups made with vegetables or grains that are not recommended. The items listed above may not be all the foods and drinks you should avoid. Talk to a dietitian to learn more. This information is not intended to replace advice given to you by your health care provider. Make sure you discuss any questions you have with your health care provider. Document Revised: 11/20/2022 Document Reviewed: 11/20/2022 Elsevier Patient Education  2024 Elsevier Inc.  _______________________________________________________  If your blood pressure at your visit was 140/90 or greater, please contact your primary care physician to follow up on this.  _______________________________________________________  If you are age 7 or older, your body mass index should be between 23-30. Your Body mass index is 34.58 kg/m. If this is out of the aforementioned range listed, please consider follow up with your Primary Care Provider.  If you are age 66 or younger, your body mass index should be  between 19-25. Your Body mass index is 34.58 kg/m. If this is out of the aformentioned range listed, please consider follow up with your Primary Care Provider.   ________________________________________________________  The [facility] providers would like to encourage you to use MYCHART to communicate with providers for non-urgent requests or questions.  Due to long hold times on the telephone, sending your provider a message by [facility] may be a faster and more efficient way to get a response.  Please allow 48 business hours for a response.  Please remember that this is for non-urgent requests.  _______________________________________________________  [facility] is using a team-based approach to care.  Your team is made up of your doctor and two to three APPS. Our APPS (Nurse Practitioners and Physician Assistants) work with your physician to ensure care continuity for you. They are fully qualified to address your health concerns and develop a treatment plan. They communicate directly with your gastroenterologist to care for you. Seeing the Advanced Practice Practitioners on your physician's team can help you by facilitating care more promptly, often allowing for earlier appointments, access to diagnostic testing, procedures, and other specialty referrals.

## 2024-09-29 NOTE — Progress Notes (Signed)
 "       Lisa Acosta 74 y.o. February 12, 1951 [id number]  Assessment & Plan:   Encounter Diagnoses  Name Primary?   Chronic diarrhea Yes   Immunosuppression    On home oxygen  therapy    Urinary and fecal incontinence    Decreased anal sphincter tone    Dyspepsia    Chronic diarrhea with fecal incontinence Persistent diarrhea with fecal incontinence, partial response to loperamide, concern for colonic inflammation without evidence of IBD or infection. - Repeat fecal calprotectin to reassess inflammation. - Stool studies for infectious causes.  GI pathogen profile.  She is immunosuppressed so I think reasonable to check this on the off chance some sort of unusual infection was missed. - Continue loperamide, consider increasing to two tablets in the morning or two twice daily as needed, safe at current and slightly increased doses, caution at doses =8/day. - Defer therapeutic changes until diagnostic results are available. - Further recommendations after test results. - I have recommended a low fiber diet.  She asked me about taking a probiotic supplement that her daughter recommends and I explained that we no longer think those are helpful or beneficial but probiotic foods are.    Pelvic floor weakness suspected with fecal and urinary incontinence Urinary and fecal incontinence with weak anal sphincter tone, ? Pelvic floor weakness - suspect so likely - Referred to urogynecology for evaluation of pelvic floor dysfunction and possible organ prolapse. - Initiate pelvic floor (Kegel) exercises.  Instructional handout provided   Longstanding dyspepsia symptoms with less frequent burning pain after switching to lansoprazole, cost concern, limited symptom improvement. - Continue lansoprazole and monitor symptoms. - Defer medication change to avoid multiple adjustments. Low fiber diet  CC: Copland, Harlene BROCKS, MD    Subjective:   Chief Complaint: Diarrhea  HPI Discussed the use of AI  scribe software for clinical note transcription with the patient, who gave verbal consent to proceed.  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  She has been evaluated by Dr. Ladora in [facility].  This started last fall, and she has been told she needed a follow-up outpatient visit for further treatment and she requested an appointment with us  due to a long wait to be seen in follow-up with Dr. Ladora.  She is a patient of Dr. Watt of primary care in our group so I agreed to see her.  Chronic Diarrhea and Fecal Incontinence: - Onset in September 2025 with abrupt transition from lifelong constipation requiring regular Miralax  to unpredictable, sudden episodes of liquid, unformed stool - Diarrhea occurs without warning and has resulted in fecal incontinence - Frequency reduced with Imodium 1 mg twice daily, but episodes persist; up to four tablets sometimes required - Diarrhea is not currently daily; nocturnal episodes have not occurred since starting Imodium - No fever or infectious illness at onset - Rare use of artificial sweeteners, limited to occasional diet soda - Extensive prior workup: elevated fecal calprotectin (178), normal colonoscopy and upper endoscopy with biopsies, normal CT enterography, negative celiac/gluten testing; no stool culture performed - Significant social withdrawal due to severity and unpredictability of symptoms  Abdominal Pain: - Severe, diffuse, aching pain began concurrently with diarrhea - Pain sometimes associated with diarrhea but can occur independently - Pain occasionally prevents her from leaving home - No cramping, burning, or sharp pain  Gastroesophageal Reflux Symptoms: - Intermittent upper abdominal burning and pain suggestive of acid reflux - Symptoms not always relieved by medication - History of  esophageal ulcer of unclear etiology - Switched from omeprazole  to lansoprazole in September or October 2025 without significant improvement, lansoprazole has a copay and omeprazole  did not  Urinary Incontinence  - New onset urinary incontinence since knee replacement, with leakage on standing, coughing, or sneezing - No dysuria - Awakens once or twice nightly to urinate; uses Poise pads - Occasional bowel incontinence - History of hysterectomy at age 26 and one vaginal delivery after prolonged labor prior to that, cannot recall if there was birth trauma  Functional Status and Recent Medical Events: - Limited mobility and additional 20-pound weight gain since 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 for severe osteoarthritis - Postoperative course complicated by delirium, hallucinations, and nightmares attributed to pain medications, requiring prolonged hospitalization and rehabilitation  Other Relevant History: - Lung sarcoidosis treated with prednisone  and methotrexate , complicated by substantial weight gain, severe COVID-19, and strep throat - Methotrexate  discontinued prior to knee replacement - Positional vertigo improved with physical therapy - Panic attacks triggered by reminders of hospitalization - Adverse psychiatric reactions to prednisone , including anxiety and hallucinations - Multiple skin lesions treated with cryotherapy, none thought to be precancerous      Colonoscopy 07/28/24 Findings  Diverticula in the descending colon and sigmoid colon  Internal small hemorrhoids  Performed forceps biopsies in the ascending colon and descending colon to  rule out microscopic colitis  TI cannot be negotiated due to scope position    EGD 07/28/24 Findings  The esophagus appeared normal.  Performed forceps biopsies in the upper third of the esophagus, middle  third of the esophagus and lower third of the esophagus to rule out  eosinophilic esophagitis  The stomach appeared  normal. Hill flap valve grade I  The duodenal bulb and 2nd part of the duodenum appeared normal. Performed  random biopsy using biopsy forceps to rule out celiac disease.    A.  DUODENAL, BIOPSY:                DUODENAL MUCOSA WITH PRESERVED VILLOUS ARCHITECTURE.                NO SIGNIFICANT HISTOPATHOLOGIC ABNORMALITIES.   B.  DISTAL ESOPHAGEAL, BIOPSY:                SQUAMOUS ESOPHAGEAL MUCOSA WITH NO SIGNIFICANT HISTOPATHOLOGIC ABNORMALITIES.                NO EVIDENCE OF ACUTE INFLAMMATION, EOSINOPHILIC ESOPHAGITIS OR MALIGNANCY.   C.  PROXIMAL ESOPHAGEAL, BIOPSY:                SQUAMOUS ESOPHAGEAL MUCOSA WITH NO SIGNIFICANT HISTOPATHOLOGIC ABNORMALITIES.                NO EVIDENCE OF ACUTE INFLAMMATION, EOSINOPHILIC ESOPHAGITIS OR MALIGNANCY.   D.  RANDOM COLON, BIOPSY:                COLONIC MUCOSA WITH NO SIGNIFICANT HISTOPATHOLOGIC ABNORMALITIES.                NO EVIDENCE OF ACUTE INFLAMMATION, MICROSCOPIC COLITIS OR MALIGNANCY.  [facility] IMPRESSION: 1.  No acute findings in the abdomen or pelvis. 2.  Multifocal colorectal mural hyperenhancement which could reflect infectious/inflammatory colitis. 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 Fib   Esophageal ulcer    Fatty liver    Medication Induced. 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   Pre-diabetes    Sarcoidosis    Past Surgical History:  Procedure Laterality Date   BREAST BIOPSY Left    needle core biopsy, benign   BRONCHIAL BIOPSY  06/15/2022   Procedure: BRONCHIAL BIOPSIES;  Surgeon: Meade Verdon RAMAN, MD;  Location: [facility] ENDOSCOPY;  Service: Pulmonary;;   BRONCHIAL BIOPSY  06/29/2022   Procedure: BRONCHIAL BIOPSIES;  Surgeon: Brenna Adine CROME, DO;  Location: [facility] ENDOSCOPY;  Service: Pulmonary;;   BRONCHIAL NEEDLE ASPIRATION BIOPSY  06/15/2022   Procedure: BRONCHIAL NEEDLE ASPIRATION BIOPSIES;  Surgeon: Meade Verdon RAMAN, MD;  Location: [facility] ENDOSCOPY;  Service: Pulmonary;;   BRONCHIAL NEEDLE ASPIRATION BIOPSY  06/29/2022   Procedure: BRONCHIAL NEEDLE ASPIRATION BIOPSIES;  Surgeon: Brenna Adine CROME, DO;  Location: [facility] ENDOSCOPY;  Service: Pulmonary;;   BRONCHIAL WASHINGS  06/15/2022   Procedure: BRONCHIAL WASHINGS;  Surgeon: Meade Verdon RAMAN, MD;  Location: [facility] ENDOSCOPY;  Service: Pulmonary;;   CATARACT EXTRACTION W/ INTRAOCULAR LENS IMPLANT     COLONOSCOPY     DILATION AND CURETTAGE OF UTERUS     Miscarriage   HAND SURGERY Left    after a fall   KNEE SURGERY Right    x2   LASIK     ROTATOR CUFF REPAIR Right    x2   TOTAL ABDOMINAL HYSTERECTOMY     TOTAL KNEE ARTHROPLASTY Right 01/15/2024   Procedure: ARTHROPLASTY, KNEE, TOTAL;  Surgeon: Vernetta Lonni GRADE, MD;  Location: [facility] OR;  Service: Orthopedics;  Laterality: Right;   VIDEO BRONCHOSCOPY N/A 06/15/2022   Procedure: VIDEO BRONCHOSCOPY WITH FLUORO;  Surgeon: Meade Verdon RAMAN, MD;  Location: [facility] ENDOSCOPY;  Service: Pulmonary;  Laterality: N/A;   VIDEO BRONCHOSCOPY WITH ENDOBRONCHIAL ULTRASOUND  06/15/2022   Procedure: VIDEO BRONCHOSCOPY WITH ENDOBRONCHIAL ULTRASOUND;  Surgeon: Meade Verdon RAMAN, MD;  Location: [facility] ENDOSCOPY;  Service: Pulmonary;;   VIDEO BRONCHOSCOPY WITH ENDOBRONCHIAL ULTRASOUND N/A 06/29/2022   Procedure: VIDEO  BRONCHOSCOPY WITH ENDOBRONCHIAL ULTRASOUND;  Surgeon: Brenna Adine CROME, DO;  Location: [facility] ENDOSCOPY;  Service: Pulmonary;  Laterality: N/A;   Social History   Social History Narrative   Patient lives at home spouse. Daughter and son-in-law live w/ them + grandsons x 2 1 is autistic and blind (in law school)   Right handed    family history includes Alcohol  abuse in her father; Alcoholism in her father; Anxiety disorder in her mother; Atrial fibrillation in her mother and sister; Congestive Heart Failure in her mother; Depression in her daughter, mother, and sister; Heart attack in her sister; Heart disease in her brother, brother, father, and mother; High Cholesterol in her father; High blood  pressure in her father; Obesity in her mother; Stroke in her mother; Thyroid  disease in her mother.   Review of Systems As per HPI  Objective:   Physical Exam @BP  110/80   Pulse (!) 57   Ht 5' 1 (1.549 m)   Wt 183 lb (83 kg)   SpO2 96%   BMI 34.58 kg/m @  General:  NAD, obese Eyes:   anicteric Lungs:  clear Heart::  S1S2 no rubs, murmurs or gallops Abdomen:  soft and obese, RLQ mildly tender, BS+, small soft and reducible umbilical hernia  Alan Fusi, CMA present   Anoderm inspection revealed small anterior tag  Digital exam revealed decreased resting tone and voluntary squeeze. No mass or rectocele present. Soft brown stool non-tender Simulated defecation with valsalva revealed appropriate abdominal contraction and descent.     Ext:   no edema, cyanosis or clubbing    Data Reviewed:  See HPI and assessment and plan but I have reviewed prior gastroenterology notes, procedural reports pathology, and also have reviewed CT enterography report.    [1]  Allergies Allergen Reactions   Dextran Anaphylaxis   Oxycontin  [Oxycodone ] Other (See Comments)    Hallucinations   Tree Extract    Penicillin G Rash   Penicillins Rash   Sulfa Antibiotics Rash and Other (See Comments)    Wound Dressing Adhesive Rash  [2]  Current Meds  Medication Sig   albuterol  (VENTOLIN  HFA) 108 (90 Base) MCG/ACT inhaler Inhale 1-2 puffs into the lungs every 4 (four) hours as needed for wheezing or shortness of breath.   alendronate  (FOSAMAX ) 70 MG tablet Take 1 tablet (70 mg total) by mouth every 7 (seven) days. Take with a full glass of water on an empty stomach.   amLODipine  (NORVASC ) 5 MG tablet TAKE 1 TABLET (5 MG TOTAL) BY MOUTH DAILY.   atenolol  (TENORMIN ) 50 MG tablet TAKE 1 TABLET BY MOUTH EVERY DAY   benzonatate  (TESSALON ) 200 MG capsule Take 1 capsule (200 mg total) by mouth 3 (three) times daily as needed for cough.   Calcium  Carb-Cholecalciferol (CALCIUM  600 + D PO) Take 1 tablet by mouth in the morning and at bedtime.   cetirizine  (ZYRTEC ) 10 MG tablet Take 1 tablet (10 mg total) by mouth daily.   flecainide  (TAMBOCOR ) 100 MG tablet Take 1 tablet (100 mg total) by mouth 2 (two) times daily.   fluticasone  (FLONASE ) 50 MCG/ACT nasal spray Place 2 sprays into both nostrils 2 (two) times daily.   guaiFENesin  (MUCINEX ) 600 MG 12 hr tablet Take 2 tablets (1,200 mg total) by mouth 2 (two) times daily. (Patient taking differently: Take 1,200 mg by mouth daily.)   HYDROcodone -acetaminophen  (NORCO/VICODIN) 5-325 MG tablet Take 1 tablet by mouth every 6 (six) hours as needed for moderate pain (pain score 4-6).   lansoprazole (PREVACID) 30 MG capsule Take 30 mg by mouth.   levothyroxine  (SYNTHROID ) 125 MCG tablet TAKE 1 TABLET BY MOUTH DAILY BEFORE BREAKFAST.   methocarbamol  (ROBAXIN ) 500 MG tablet Take 1 tablet (500 mg total) by mouth every 6 (six) hours as needed.   Multiple Vitamins-Minerals (PRESERVISION AREDS 2+MULTI VIT PO) Take 1 capsule by mouth daily.   nystatin -triamcinolone  (MYCOLOG II) cream Apply 1 Application topically 2 (two) times daily.   ondansetron  (ZOFRAN ) 8 MG tablet Take 1 tablet (8 mg total) by mouth every 8 (eight) hours as needed for nausea or vomiting.   Propylene  Glycol (SYSTANE BALANCE) 0.6 % SOLN Place 1 drop into both eyes as needed (dry  eyes).   rivaroxaban  (XARELTO ) 20 MG TABS tablet Take 1 tablet (20 mg total) by mouth daily with supper.   rosuvastatin  (CRESTOR ) 20 MG tablet Take 1 tablet (20 mg total) by mouth daily.   sertraline  (ZOLOFT ) 100 MG tablet Take 1 tablet (100 mg total) by mouth daily.   traZODone  (DESYREL ) 50 MG tablet Take 25 mg in the afternoon as needed for panic.  Take 50 mg at bedtime as needed for insomnia (Patient taking differently: Take 50 mg by mouth.   Take 50 mg at bedtime as needed for insomnia)   "

## 2024-09-30 ENCOUNTER — Other Ambulatory Visit: Payer: Self-pay | Admitting: Family Medicine

## 2024-09-30 DIAGNOSIS — G4700 Insomnia, unspecified: Secondary | ICD-10-CM

## 2024-10-03 ENCOUNTER — Other Ambulatory Visit

## 2024-10-03 DIAGNOSIS — K529 Noninfective gastroenteritis and colitis, unspecified: Secondary | ICD-10-CM

## 2024-10-03 DIAGNOSIS — D849 Immunodeficiency, unspecified: Secondary | ICD-10-CM

## 2024-10-08 ENCOUNTER — Encounter: Payer: Self-pay | Admitting: Family Medicine

## 2024-10-08 LAB — GI PROFILE, STOOL, PCR

## 2024-10-08 LAB — CALPROTECTIN, FECAL: Calprotectin, Fecal: 409 ug/g — AB (ref 0–120)

## 2024-10-09 NOTE — Telephone Encounter (Signed)
 Just a FYI

## 2024-10-14 ENCOUNTER — Telehealth: Payer: Self-pay | Admitting: Internal Medicine

## 2024-10-14 NOTE — Telephone Encounter (Signed)
 Left message that I had stool test results to discuss and that we would call back.

## 2024-10-15 ENCOUNTER — Ambulatory Visit: Admitting: Family Medicine

## 2024-10-16 ENCOUNTER — Encounter: Payer: Self-pay | Admitting: Internal Medicine

## 2024-10-17 NOTE — Telephone Encounter (Signed)
See MyChart thread.

## 2025-01-22 ENCOUNTER — Ambulatory Visit (INDEPENDENT_AMBULATORY_CARE_PROVIDER_SITE_OTHER)

## 2025-02-05 ENCOUNTER — Ambulatory Visit: Admitting: Orthopaedic Surgery
